# Patient Record
Sex: Female | Born: 1955 | Race: White | Hispanic: No | Marital: Married | Smoking: Never smoker
Health system: Southern US, Community
[De-identification: ages and names within clinical notes are randomized; demographics above are authoritative.]

## PROBLEM LIST (undated history)

## (undated) DIAGNOSIS — G4730 Sleep apnea, unspecified: Secondary | ICD-10-CM

## (undated) DIAGNOSIS — I10 Essential (primary) hypertension: Secondary | ICD-10-CM

## (undated) DIAGNOSIS — J45909 Unspecified asthma, uncomplicated: Secondary | ICD-10-CM

## (undated) DIAGNOSIS — M810 Age-related osteoporosis without current pathological fracture: Secondary | ICD-10-CM

## (undated) DIAGNOSIS — F419 Anxiety disorder, unspecified: Secondary | ICD-10-CM

## (undated) DIAGNOSIS — H269 Unspecified cataract: Secondary | ICD-10-CM

## (undated) DIAGNOSIS — I2510 Atherosclerotic heart disease of native coronary artery without angina pectoris: Secondary | ICD-10-CM

## (undated) DIAGNOSIS — R7303 Prediabetes: Secondary | ICD-10-CM

## (undated) DIAGNOSIS — K5732 Diverticulitis of large intestine without perforation or abscess without bleeding: Secondary | ICD-10-CM

## (undated) DIAGNOSIS — M1990 Unspecified osteoarthritis, unspecified site: Secondary | ICD-10-CM

## (undated) DIAGNOSIS — J189 Pneumonia, unspecified organism: Secondary | ICD-10-CM

## (undated) DIAGNOSIS — E079 Disorder of thyroid, unspecified: Secondary | ICD-10-CM

## (undated) DIAGNOSIS — G8929 Other chronic pain: Secondary | ICD-10-CM

## (undated) DIAGNOSIS — J939 Pneumothorax, unspecified: Secondary | ICD-10-CM

## (undated) DIAGNOSIS — T7840XA Allergy, unspecified, initial encounter: Secondary | ICD-10-CM

## (undated) DIAGNOSIS — F32A Depression, unspecified: Secondary | ICD-10-CM

## (undated) DIAGNOSIS — K219 Gastro-esophageal reflux disease without esophagitis: Secondary | ICD-10-CM

## (undated) DIAGNOSIS — E039 Hypothyroidism, unspecified: Secondary | ICD-10-CM

## (undated) DIAGNOSIS — M4120 Other idiopathic scoliosis, site unspecified: Secondary | ICD-10-CM

## (undated) DIAGNOSIS — E119 Type 2 diabetes mellitus without complications: Secondary | ICD-10-CM

## (undated) DIAGNOSIS — F329 Major depressive disorder, single episode, unspecified: Secondary | ICD-10-CM

## (undated) DIAGNOSIS — Z9289 Personal history of other medical treatment: Secondary | ICD-10-CM

## (undated) DIAGNOSIS — M549 Dorsalgia, unspecified: Secondary | ICD-10-CM

## (undated) HISTORY — DX: Unspecified asthma, uncomplicated: J45.909

## (undated) HISTORY — DX: Disorder of thyroid, unspecified: E07.9

## (undated) HISTORY — DX: Anxiety disorder, unspecified: F41.9

## (undated) HISTORY — DX: Essential (primary) hypertension: I10

## (undated) HISTORY — PX: SPINAL FUSION: SHX223

## (undated) HISTORY — DX: Depression, unspecified: F32.A

## (undated) HISTORY — DX: Unspecified cataract: H26.9

## (undated) HISTORY — DX: Type 2 diabetes mellitus without complications: E11.9

## (undated) HISTORY — DX: Sleep apnea, unspecified: G47.30

## (undated) HISTORY — PX: SMALL INTESTINE SURGERY: SHX150

## (undated) HISTORY — PX: TONSILLECTOMY: SUR1361

## (undated) HISTORY — PX: COLON SURGERY: SHX602

## (undated) HISTORY — DX: Other idiopathic scoliosis, site unspecified: M41.20

## (undated) HISTORY — PX: INCONTINENCE SURGERY: SHX676

## (undated) HISTORY — PX: JOINT REPLACEMENT: SHX530

## (undated) HISTORY — PX: ELBOW SURGERY: SHX618

## (undated) HISTORY — DX: Age-related osteoporosis without current pathological fracture: M81.0

## (undated) HISTORY — DX: Allergy, unspecified, initial encounter: T78.40XA

## (undated) HISTORY — PX: ABDOMINAL HYSTERECTOMY: SHX81

## (undated) HISTORY — PX: TOTAL HIP ARTHROPLASTY: SHX124

## (undated) HISTORY — PX: OTHER SURGICAL HISTORY: SHX169

## (undated) HISTORY — DX: Major depressive disorder, single episode, unspecified: F32.9

## (undated) HISTORY — DX: Diverticulitis of large intestine without perforation or abscess without bleeding: K57.32

## (undated) HISTORY — DX: Gastro-esophageal reflux disease without esophagitis: K21.9

---

## 2013-08-11 LAB — HM DEXA SCAN

## 2014-03-31 DIAGNOSIS — J189 Pneumonia, unspecified organism: Secondary | ICD-10-CM

## 2014-03-31 HISTORY — DX: Pneumonia, unspecified organism: J18.9

## 2015-07-20 LAB — HEPATIC FUNCTION PANEL
ALT: 21 U/L (ref 7–35)
AST: 19 U/L (ref 13–35)
Alkaline Phosphatase: 60 U/L (ref 25–125)
BILIRUBIN, TOTAL: 0.4 mg/dL

## 2015-07-20 LAB — BASIC METABOLIC PANEL
BUN: 18 mg/dL (ref 4–21)
CREATININE: 0.7 mg/dL (ref 0.5–1.1)
Glucose: 106 mg/dL
POTASSIUM: 3.9 mmol/L (ref 3.4–5.3)
SODIUM: 137 mmol/L (ref 137–147)

## 2015-07-20 LAB — CBC AND DIFFERENTIAL
HCT: 40 % (ref 36–46)
Hemoglobin: 13.2 g/dL (ref 12.0–16.0)
Platelets: 184 10*3/uL (ref 150–399)
WBC: 6.3 10*3/mL

## 2015-07-20 LAB — LIPID PANEL
CHOLESTEROL: 208 mg/dL — AB (ref 0–200)
HDL: 79 mg/dL — AB (ref 35–70)
Triglycerides: 86 mg/dL (ref 40–160)

## 2015-07-20 LAB — TSH: TSH: 0.9 u[IU]/mL (ref 0.41–5.90)

## 2015-11-27 ENCOUNTER — Ambulatory Visit (INDEPENDENT_AMBULATORY_CARE_PROVIDER_SITE_OTHER): Payer: BLUE CROSS/BLUE SHIELD | Admitting: Physician Assistant

## 2015-11-27 ENCOUNTER — Telehealth: Payer: Self-pay | Admitting: Physician Assistant

## 2015-11-27 ENCOUNTER — Encounter: Payer: Self-pay | Admitting: Physician Assistant

## 2015-11-27 VITALS — BP 104/63 | HR 94 | Ht <= 58 in | Wt 115.0 lb

## 2015-11-27 DIAGNOSIS — K219 Gastro-esophageal reflux disease without esophagitis: Secondary | ICD-10-CM

## 2015-11-27 DIAGNOSIS — M810 Age-related osteoporosis without current pathological fracture: Secondary | ICD-10-CM

## 2015-11-27 DIAGNOSIS — F3342 Major depressive disorder, recurrent, in full remission: Secondary | ICD-10-CM

## 2015-11-27 DIAGNOSIS — Z23 Encounter for immunization: Secondary | ICD-10-CM | POA: Diagnosis not present

## 2015-11-27 DIAGNOSIS — K2100 Gastro-esophageal reflux disease with esophagitis, without bleeding: Secondary | ICD-10-CM

## 2015-11-27 DIAGNOSIS — Z79899 Other long term (current) drug therapy: Secondary | ICD-10-CM

## 2015-11-27 DIAGNOSIS — M6289 Other specified disorders of muscle: Secondary | ICD-10-CM

## 2015-11-27 DIAGNOSIS — J4520 Mild intermittent asthma, uncomplicated: Secondary | ICD-10-CM

## 2015-11-27 DIAGNOSIS — J45909 Unspecified asthma, uncomplicated: Secondary | ICD-10-CM

## 2015-11-27 DIAGNOSIS — F411 Generalized anxiety disorder: Secondary | ICD-10-CM | POA: Diagnosis not present

## 2015-11-27 DIAGNOSIS — E039 Hypothyroidism, unspecified: Secondary | ICD-10-CM | POA: Insufficient documentation

## 2015-11-27 DIAGNOSIS — L821 Other seborrheic keratosis: Secondary | ICD-10-CM | POA: Insufficient documentation

## 2015-11-27 DIAGNOSIS — G4700 Insomnia, unspecified: Secondary | ICD-10-CM | POA: Insufficient documentation

## 2015-11-27 DIAGNOSIS — G729 Myopathy, unspecified: Secondary | ICD-10-CM

## 2015-11-27 DIAGNOSIS — I2510 Atherosclerotic heart disease of native coronary artery without angina pectoris: Secondary | ICD-10-CM | POA: Insufficient documentation

## 2015-11-27 DIAGNOSIS — M419 Scoliosis, unspecified: Secondary | ICD-10-CM

## 2015-11-27 DIAGNOSIS — H04123 Dry eye syndrome of bilateral lacrimal glands: Secondary | ICD-10-CM | POA: Insufficient documentation

## 2015-11-27 HISTORY — DX: Unspecified asthma, uncomplicated: J45.909

## 2015-11-27 HISTORY — DX: Age-related osteoporosis without current pathological fracture: M81.0

## 2015-11-27 HISTORY — DX: Gastro-esophageal reflux disease without esophagitis: K21.9

## 2015-11-27 NOTE — Patient Instructions (Addendum)
Seborrheic Keratosis Seborrheic keratosis is a common, noncancerous (benign) skin growth. This condition causes waxy, rough, tan, brown, or black spots to appear on the skin. These skin growths can be flat or raised. CAUSES The cause of this condition is not known. RISK FACTORS This condition is more likely to develop in:  People who have a family history of seborrheic keratosis.  People who are 17 or older.  People who are pregnant.  People who have had estrogen replacement therapy. SYMPTOMS This condition often occurs on the face, chest, shoulders, back, or other areas. These growths:  Are usually painless, but may become irritated and itchy.  Can be yellow, brown, black, or other colors.  Are slightly raised or have a flat surface.  Are sometimes rough or wart-like in texture.  Are often waxy on the surface.  Are round or oval-shaped.  Sometimes look like they are "stuck on."  Often occur in groups, but may occur as a single growth. DIAGNOSIS This condition is diagnosed with a medical history and physical exam. A sample of the growth may be tested (skin biopsy). You may need to see a skin specialist (dermatologist). TREATMENT Treatment is not usually needed for this condition, unless the growths are irritated or are often bleeding. You may also choose to have the growths removed if you do not like their appearance. Most commonly, these growths are treated with a procedure in which liquid nitrogen is applied to "freeze" off the growth (cryosurgery). They may also be burned off with electricity or cut off. HOME CARE INSTRUCTIONS  Watch your growth for any changes.  Keep all follow-up visits as told by your health care provider. This is important.  Do not scratch or pick at the growth or growths. This can cause them to become irritated or infected. SEEK MEDICAL CARE IF:  You suddenly have many new growths.  Your growth bleeds, itches, or hurts.  Your growth suddenly  becomes larger or changes color.   This information is not intended to replace advice given to you by your health care provider. Make sure you discuss any questions you have with your health care provider.   Document Released: 04/19/2010 Document Revised: 12/06/2014 Document Reviewed: 08/02/2014 Elsevier Interactive Patient Education 2016 [employer]    Biceps Tendon Disruption (Distal) With Rehab The biceps tendon attaches the biceps muscle to the bones of the elbow and the shoulder. A distal biceps tendon disruption is a tear of this tendon at the end the attached near the elbow. A distal biceps tendon rupture is an uncommon injury. These injuries usually involve a complete tear of the tendon from the bone; however, partial tears are also possible. The bicep muscle works with other muscles to bend the elbow and rotate the palm upward (supinate). A complete biceps rupture will result in approximately a 30% decrease in elbow bending strength and a 40% decrease in one's ability to supinate the wrist. SYMPTOMS   Pain, tenderness, swelling, warmth, or redness at the elbow, usually in the front of the elbow.  Pain that worsens with flexion of the elbow against resistance and when straightening the elbow.  Bulge can be seen and felt in the arm.  Bruising (contusion) in the elbow or forearm after 24 hours.  Limited motion of the elbow.  Weakness with attempted elbow bending (lifting or carrying) or rotation of the wrist (like when using a screwdriver).  A crackling sound (crepitation) when the tendon or elbow is moved or touched. CAUSES  A biceps  tendon rupture occurs when the tendon is subjected to a force that is greater than it can withstand, such as straightening the elbow while the biceps is contracted or direct trauma (rare). RISK INCREASES WITH:   Sports that involve contact, as well as throwing sports, gymnastics, weightlifting, and bodybuilding.  Heavy labor.  Poor strength  and flexibility.  Failure to warm-up properly before activity. PREVENTION  Warm up and stretch appropriately before activity.  Maintain physical fitness:  Strength, flexibility, and endurance.  Cardiovascular fitness  Allow your body to recover between practices and competition.  Learn and use proper technique. PROGNOSIS  Surgery is usually required to fix distal biceps tendon rupture. After surgery, a recovery period of 4 to 8 months can be expected to allow for healing and a return to sports.  RELATED COMPLICATIONS  Weakness of elbow bending and forearm rotation, especially if treated non-surgically.  Prolonged disability.  Re-rupture of the tendon after surgery.  Risks of surgery, including infection, bleeding, injury to nerves, elbow or wrist stiffness or loss of motion, and weakness of elbow bending or wrist rotation. TREATMENT  Treatment initially consists of ice and medication to help reduce pain and inflammation. A sling may also be worn to increase one's comfort. Surgery is required for a full recovery and return to sports. Surgery involves reattaching the tendon to the bone. Weakness can be expected if surgery is not performed; however, this may be acceptable for sedentary individuals. Surgery is usually followed by immobilization and rehabilitation exercises to regain strength and a full range of motion.  MEDICATION  If pain medication is necessary, nonsteroidal anti-inflammatory medications, such as aspirin and ibuprofen, or other minor pain relievers, such as acetaminophen, are often recommended.  Do not take pain medication for 7 days before surgery.  Prescription pain relievers may be given if deemed necessary by your caregiver. Use only as directed and only as much as you need. HEAT AND COLD  Cold treatment (icing) relieves pain and reduces inflammation. Cold treatment should be applied for 10 to 15 minutes every 2 to 3 hours for inflammation and pain and  immediately after any activity that aggravates your symptoms. Use ice packs or an ice massage.  Heat treatment may be used prior to performing the stretching and strengthening activities prescribed by your caregiver, physical therapist, or athletic trainer. Use a heat pack or a warm soak. SEEK MEDICAL CARE IF:   Symptoms get worse or do not improve in 2 weeks despite treatment.  You experience pain, numbness, or coldness in the hand.  Blue, gray, or dark color appears in the fingernails.  Any of the following occur after surgery:  Increased pain, swelling, redness, drainage, or bleeding in the surgical area.  Signs of infection (headache, muscle aches, dizziness, or a general ill feeling with fever).  New, unexplained symptoms develop (drugs used in treatment may produce side effects). EXERCISES RANGE OF MOTION (ROM) AND STRETCHING EXERCISES - Biceps Tendon Disruption (Distal) Once your physician, physical therapist or athletic trainer has permitted you to discontinue using your brace or splint, you may begin to restore your elbow motion by using these exercises. Beginning these exercises before your provider's approval may result in delayed healing. While completing these exercises, remember:   Restoring tissue flexibility helps normal motion to return to the joints. This allows healthier, less painful movement and activity.  An effective stretch should be held for at least 30 seconds.  A stretch should never be painful. You should only feel a gentle  lengthening or release in the stretched tissue. RANGE OF MOTION - Extension  Hold your right / left arm at your side and straighten your elbow as far as you can using your right / left arm muscles.  Straighten the right / left elbow farther by gently pushing down on your forearm until you feel a gentle stretch on the inside of your elbow. Hold this position for __________ seconds.  Slowly return to the starting position. Repeat  __________ times. Complete this exercise __________ times per day.  RANGE OF MOTION - Flexion  Hold your right / left arm at your side and bend your elbow as far as you can using your right / left arm muscles.  Bend the right / left elbow farther by gently pushing up on your forearm until you feel a gentle stretch on the outside of your elbow. Hold this position for __________ seconds.  Slowly return to the starting position. Repeat __________ times. Complete this exercise __________ times per day.  RANGE OF MOTION - Supination, Active   Stand or sit with your elbows at your side. Bend your right / left elbow to 90 degrees.  Turn your palm upward until you feel a gentle stretch on the inside of your forearm.  Hold this position for __________ seconds. Slowly release and return to the starting position. Repeat __________ times. Complete this stretch __________ times per day.  RANGE OF MOTION - Pronation, Active   Stand or sit with your elbows at your side. Bend your right / left elbow to 90 degrees.  Turn your palm downward until you feel a gentle stretch on the top of your forearm.  Hold this position for __________ seconds. Slowly release and return to the starting position. Repeat __________ times. Complete this stretch __________ times per day.  STRENGTHENING EXERCISES - Biceps Tendon Disruption (Distal) Once your physician, physical therapist, or athletic trainer has permitted you to discontinue using your brace or splint, you may begin restoring your arm strength by using these exercises. Beginning these before your provider's approval may result in delayed healing. While completing these exercises, remember:   Muscles can gain both the endurance and the strength needed for everyday activities through controlled exercises.  Complete these exercises as instructed by your physician, physical therapist, or athletic trainer. Progress the resistance and repetitions only as  guided.  You may experience muscle soreness or fatigue, but the pain or discomfort you are trying to eliminate should never worsen during these exercises. If this pain does worsen, stop and make certain you are following the directions exactly. If the pain is still present after adjustments, discontinue the exercise until you can discuss the trouble with your clinician. STRENGTH - Elbow Flexors, Isometric   Stand or sit upright on a firm surface. Place your right / left arm so that your hand is palm-up and at the height of your waist.  Place your opposite hand on top of your forearm. Gently push down as your right / left arm resists. Push as hard as you can with both arms without causing any pain or movement at your right / left elbow. Hold this stationary position for __________ seconds.  Gradually release the tension in both arms. Allow your muscles to relax completely before repeating. Repeat __________ times. Complete this exercise __________ times per day. STRENGTH - Elbow Extensors, Isometric   Stand or sit upright on a firm surface. Place your right / left arm so that your palm faces your abdomen and it  is at the height of your waist.  Place your opposite hand on the underside of your forearm. Gently push up as your right / left arm resists. Push as hard as you can with both arms without causing any pain or movement at your right / left elbow. Hold this stationary position for __________ seconds.  Gradually release the tension in both arms. Allow your muscles to relax completely before repeating. Repeat __________ times. Complete this exercise __________ times per day. STRENGTH - Elbow Flexors, Supinated  With good posture, stand or sit on a firm chair without armrests. Allow your right / left arm to rest at your side with your palm facing forward.  Holding a __________ weight or gripping a rubber exercise band/tubing, bring your right / left hand toward your shoulder.  Allow your  muscles to control the resistance as your hand returns to your side. Repeat __________ times. Complete this exercise __________ times per day.  STRENGTH - Elbow Flexors, Neutral  With good posture, stand or sit on a firm chair without armrests. Allow your right / left arm to rest at your side with your thumb facing forward.  Holding a __________ weight or gripping a rubber exercise band/tubing, bring your right / left hand toward your shoulder.  Allow your muscles to control the resistance as your hand returns to your side. Repeat __________ times. Complete this exercise __________ times per day.  STRENGTH - Elbow Extensors  Lie on your back. Extend your right / left elbow into the air, pointing it toward the ceiling. Brace your arm with your opposite hand.*  Holding a __________ weight in your hand, slowly straighten your right / left elbow.  Allow your muscles to control the weight as your hand returns to its starting position. Repeat __________ times. Complete this exercise __________ times per day. *You may also stand with your elbow overhead and pointed toward the ceiling and supported by your opposite hand. STRENGTH - Elbow Extensors, Dynamic  With good posture, stand or sit on a firm chair without armrests. Keeping your upper arms at your side, bring both hands up to your right / left shoulder while gripping a rubber exercise band/tubing. Your right / left hand should be just below the other hand.  Straighten your right / left elbow. Hold for __________ seconds.  Allow your muscles to control the rubber exercise band/tubing as your hand returns to your shoulder. Repeat __________ times. Complete this exercise __________ times per day. STRENGTH - Forearm Supinators   Sit with your right / left forearm supported on a table, keeping your elbow below shoulder height. Rest your hand over the edge, palm down.  Gently grip a hammer or a soup ladle.  Without moving your elbow, slowly  turn your palm and hand upward to a "thumbs-up" position.  Hold this position for __________ seconds. Slowly return to the starting position. Repeat __________ times. Complete this exercise __________ times per day.  STRENGTH - Forearm Pronators   Sit with your right / left forearm supported on a table, keeping your elbow below shoulder height. Rest your hand over the edge, palm up.  Gently grip a hammer or a soup ladle.  Without moving your elbow, slowly turn your palm and hand upward to a "thumbs-up" position.  Hold this position for __________ seconds. Slowly return to the starting position. Repeat __________ times. Complete this exercise __________ times per day.    This information is not intended to replace advice given to you by your  health care provider. Make sure you discuss any questions you have with your health care provider.   Document Released: 03/17/2005 Document Revised: 04/07/2014 Document Reviewed: 06/29/2008 Elsevier Interactive Patient Education [employer].

## 2015-11-27 NOTE — Telephone Encounter (Signed)
Records release form has been sent. Waiting for records to come in.

## 2015-11-27 NOTE — Progress Notes (Signed)
Subjective:    Patient ID: Leslie Roberson, female    DOB: [birth year], 60 y.o.   MRN: [id number]  HPI Patient is a 60 year old female who presents to the clinic to establish care. She just moved down here from [place].  .. Active Ambulatory Problems    Diagnosis Date Noted  . GERD (gastroesophageal reflux disease) 11/27/2015  . Osteoporosis 11/27/2015  . GAD (generalized anxiety disorder) 11/27/2015  . MDD (major depressive disorder), recurrent, in full remission ([place]) 11/27/2015  . Chronic dryness of both eyes 11/27/2015  . Hypothyroidism 11/27/2015  . Asthma, chronic 11/27/2015  . Insomnia 11/27/2015  . Seborrheic keratoses 11/27/2015   Resolved Ambulatory Problems    Diagnosis Date Noted  . No Resolved Ambulatory Problems   Past Medical History:  Diagnosis Date  . Depression   . Thyroid disease    .Marland Kitchen Family History  Problem Relation Age of Onset  . Cancer Mother     lung  . Depression Mother   . Heart attack Father   . Hyperlipidemia Father   . Hypertension Father   . Diabetes Maternal Aunt   . Hyperlipidemia Paternal Aunt   . COPD Paternal 42    .Marland Kitchen Social History   Social History  . Marital status: Married    Spouse name: N/A  . Number of children: N/A  . Years of education: N/A   Occupational History  . Not on file.   Social History Main Topics  . Smoking status: Never Smoker  . Smokeless tobacco: Never Used  . Alcohol use Yes  . Drug use: No  . Sexual activity: Yes   Other Topics Concern  . Not on file   Social History Narrative  . No narrative on file   PatientHas a few concerns today.  She does need her priorly a injection for osteoporosis. She is behind on this dose.  She also has some gross all over her body that she wants me to look at. They do not hurt and they're not bleeding. They're slightly dark in color.  She also has some right arm pain for the last 8 months. X-ray was done at previous PCP and normal. Some days  pain is 2 out of 10 and others it is 8 out of 10. Seems to be worse with lifting. Seems to ache when touched right over the middle section of right arm. She denies any known trauma. They have been moving over the past 8 months from [place]. She has been doing a lot of lifting.   Review of Systems  All other systems reviewed and are negative.      Objective:   Physical Exam  Constitutional: She is oriented to person, place, and time. She appears well-developed and well-nourished.  HENT:  Head: Normocephalic and atraumatic.  Cardiovascular: Normal rate, regular rhythm and normal heart sounds.   Pulmonary/Chest: Effort normal and breath sounds normal.  Musculoskeletal:  Right shoulder is higher than left shoulder due to major scoliosis curvature to the right.  Tightness of over right upper back.   Tenderness to palpation over bicep tendon. No tenderness over distal or proximal insertion. No bruising or swelling. NROM. Normal strength 5/5.   Neurological: She is alert and oriented to person, place, and time.  Skin:  Multiple hyperpigmented rasied wart like lesions over hands, back, upper leg.   Psychiatric: She has a normal mood and affect. Her behavior is normal.          Assessment & Plan:  Osteoporosis-  bmp ordered. prolia to be ordered. Pt is due.   Right arm pain- unclear etiology. Seems like bicep tendon could be inflamed. Exercises given. Xray normal per patient at previous PCP. Encouraged ice, NSAID. Will refer to sports med to consider MRI and/or other treatment.   Scoliosis/muscle tightness- will refer to chiropractor and consider regular massages Massage [facility]. Soma as needed. Tylenol #3 as needed.   Depression/insomnia- controlled. Will refill as needed.   Hypothyroidism- will refill as needed.  GERD- on dexilant. Discussed getting an endoscopy. Will consider in the future.   Seborrheic keratosis-reassured patient this is a benign condition. Gave handout.  Discussed removal with cryotherapy if bothersome.   Waiting for records and labs from [place].   Flu shot given today without complication.

## 2015-11-27 NOTE — Telephone Encounter (Signed)
[facility], spoke with [place]. Unable to complete prior authorization for continuation of Prolia therapy until records arrive. Must have Pt's "T score" and the date it was obtained.

## 2015-11-27 NOTE — Telephone Encounter (Signed)
Ok will you let patient know.she signed release maybe someone could call and make sure [place] PCP is sending records.

## 2015-11-28 ENCOUNTER — Other Ambulatory Visit: Payer: Self-pay | Admitting: Physician Assistant

## 2015-11-28 ENCOUNTER — Encounter: Payer: Self-pay | Admitting: Physician Assistant

## 2015-11-28 DIAGNOSIS — K2100 Gastro-esophageal reflux disease with esophagitis, without bleeding: Secondary | ICD-10-CM

## 2015-11-28 DIAGNOSIS — Z1231 Encounter for screening mammogram for malignant neoplasm of breast: Secondary | ICD-10-CM

## 2015-11-28 LAB — BASIC METABOLIC PANEL
BUN: 15 mg/dL (ref 7–25)
CALCIUM: 9.2 mg/dL (ref 8.6–10.4)
CHLORIDE: 103 mmol/L (ref 98–110)
CO2: 31 mmol/L (ref 20–31)
CREATININE: 0.95 mg/dL (ref 0.50–0.99)
GLUCOSE: 74 mg/dL (ref 65–99)
Potassium: 4.3 mmol/L (ref 3.5–5.3)
SODIUM: 141 mmol/L (ref 135–146)

## 2015-11-29 ENCOUNTER — Other Ambulatory Visit: Payer: Self-pay | Admitting: *Deleted

## 2015-11-29 DIAGNOSIS — M9902 Segmental and somatic dysfunction of thoracic region: Secondary | ICD-10-CM | POA: Diagnosis not present

## 2015-11-29 DIAGNOSIS — M791 Myalgia: Secondary | ICD-10-CM | POA: Diagnosis not present

## 2015-11-29 DIAGNOSIS — M9901 Segmental and somatic dysfunction of cervical region: Secondary | ICD-10-CM | POA: Diagnosis not present

## 2015-11-29 DIAGNOSIS — G44209 Tension-type headache, unspecified, not intractable: Secondary | ICD-10-CM | POA: Diagnosis not present

## 2015-12-03 ENCOUNTER — Other Ambulatory Visit: Payer: Self-pay | Admitting: Physician Assistant

## 2015-12-05 DIAGNOSIS — M9902 Segmental and somatic dysfunction of thoracic region: Secondary | ICD-10-CM | POA: Diagnosis not present

## 2015-12-05 DIAGNOSIS — M9901 Segmental and somatic dysfunction of cervical region: Secondary | ICD-10-CM | POA: Diagnosis not present

## 2015-12-05 DIAGNOSIS — M791 Myalgia: Secondary | ICD-10-CM | POA: Diagnosis not present

## 2015-12-05 DIAGNOSIS — G44209 Tension-type headache, unspecified, not intractable: Secondary | ICD-10-CM | POA: Diagnosis not present

## 2015-12-06 ENCOUNTER — Telehealth: Payer: Self-pay | Admitting: Physician Assistant

## 2015-12-06 NOTE — Telephone Encounter (Signed)
Ms. Leslie Roberson stopped by saying that she has been waiting on a prescription for Clonazepam to be sent to [facility]. She says she has been waiting for it all week.

## 2015-12-07 ENCOUNTER — Other Ambulatory Visit: Payer: Self-pay | Admitting: Physician Assistant

## 2015-12-07 NOTE — Telephone Encounter (Signed)
In system it was sent on 9/6- can we call pharmacy to see if patient has picked up? Or do we need to refax.

## 2015-12-07 NOTE — Progress Notes (Signed)
Sent to [facility]ber to call patient and see whats going on. klonapin was sent on 12/05/15.

## 2015-12-07 NOTE — Telephone Encounter (Signed)
Faxed to pharmacy again this morning.  Pt notified.

## 2015-12-12 DIAGNOSIS — M791 Myalgia: Secondary | ICD-10-CM | POA: Diagnosis not present

## 2015-12-12 DIAGNOSIS — M9902 Segmental and somatic dysfunction of thoracic region: Secondary | ICD-10-CM | POA: Diagnosis not present

## 2015-12-12 DIAGNOSIS — G44209 Tension-type headache, unspecified, not intractable: Secondary | ICD-10-CM | POA: Diagnosis not present

## 2015-12-12 DIAGNOSIS — M9901 Segmental and somatic dysfunction of cervical region: Secondary | ICD-10-CM | POA: Diagnosis not present

## 2015-12-13 DIAGNOSIS — K219 Gastro-esophageal reflux disease without esophagitis: Secondary | ICD-10-CM | POA: Diagnosis not present

## 2015-12-13 DIAGNOSIS — Z86010 Personal history of colonic polyps: Secondary | ICD-10-CM | POA: Diagnosis not present

## 2015-12-20 DIAGNOSIS — K219 Gastro-esophageal reflux disease without esophagitis: Secondary | ICD-10-CM | POA: Diagnosis not present

## 2015-12-20 DIAGNOSIS — Z86010 Personal history of colonic polyps: Secondary | ICD-10-CM | POA: Diagnosis not present

## 2015-12-20 LAB — HM COLONOSCOPY

## 2015-12-21 ENCOUNTER — Encounter: Payer: Self-pay | Admitting: Physician Assistant

## 2015-12-21 DIAGNOSIS — K5732 Diverticulitis of large intestine without perforation or abscess without bleeding: Secondary | ICD-10-CM | POA: Insufficient documentation

## 2015-12-21 DIAGNOSIS — M9901 Segmental and somatic dysfunction of cervical region: Secondary | ICD-10-CM | POA: Diagnosis not present

## 2015-12-21 DIAGNOSIS — G44209 Tension-type headache, unspecified, not intractable: Secondary | ICD-10-CM | POA: Diagnosis not present

## 2015-12-21 DIAGNOSIS — M9902 Segmental and somatic dysfunction of thoracic region: Secondary | ICD-10-CM | POA: Diagnosis not present

## 2015-12-21 DIAGNOSIS — M791 Myalgia: Secondary | ICD-10-CM | POA: Diagnosis not present

## 2015-12-21 HISTORY — DX: Diverticulitis of large intestine without perforation or abscess without bleeding: K57.32

## 2015-12-25 ENCOUNTER — Encounter: Payer: Self-pay | Admitting: Physician Assistant

## 2015-12-25 ENCOUNTER — Ambulatory Visit (INDEPENDENT_AMBULATORY_CARE_PROVIDER_SITE_OTHER): Payer: BLUE CROSS/BLUE SHIELD | Admitting: Family Medicine

## 2015-12-25 ENCOUNTER — Ambulatory Visit (INDEPENDENT_AMBULATORY_CARE_PROVIDER_SITE_OTHER): Payer: BLUE CROSS/BLUE SHIELD | Admitting: Physician Assistant

## 2015-12-25 VITALS — BP 129/70 | HR 112 | Ht <= 58 in | Wt 116.0 lb

## 2015-12-25 VITALS — BP 129/70 | HR 112 | Wt 116.0 lb

## 2015-12-25 DIAGNOSIS — M810 Age-related osteoporosis without current pathological fracture: Secondary | ICD-10-CM | POA: Diagnosis not present

## 2015-12-25 DIAGNOSIS — M7521 Bicipital tendinitis, right shoulder: Secondary | ICD-10-CM | POA: Diagnosis not present

## 2015-12-25 DIAGNOSIS — M7070 Other bursitis of hip, unspecified hip: Secondary | ICD-10-CM | POA: Insufficient documentation

## 2015-12-25 DIAGNOSIS — Z1159 Encounter for screening for other viral diseases: Secondary | ICD-10-CM

## 2015-12-25 DIAGNOSIS — Z13220 Encounter for screening for lipoid disorders: Secondary | ICD-10-CM

## 2015-12-25 DIAGNOSIS — M7071 Other bursitis of hip, right hip: Secondary | ICD-10-CM | POA: Diagnosis not present

## 2015-12-25 DIAGNOSIS — IMO0001 Reserved for inherently not codable concepts without codable children: Secondary | ICD-10-CM

## 2015-12-25 DIAGNOSIS — M791 Myalgia: Secondary | ICD-10-CM | POA: Diagnosis not present

## 2015-12-25 DIAGNOSIS — M609 Myositis, unspecified: Secondary | ICD-10-CM

## 2015-12-25 MED ORDER — DICLOFENAC SODIUM 1 % TD GEL: 4.0000 g | Freq: Four times a day (QID) | TRANSDERMAL | 2 refills | Status: DC

## 2015-12-25 MED ORDER — HYDROCORTISONE 2.5 % EX OINT: TOPICAL_OINTMENT | CUTANEOUS | 1 refills | Status: DC

## 2015-12-25 NOTE — Patient Instructions (Signed)
Thank you for coming in today. Attend PT.  Return before the wedding in about 4 weeks.    Trochanteric Bursitis You have hip pain due to trochanteric bursitis. Bursitis means that the sack near the outside of the hip is filled with fluid and inflamed. This sack is made up of protective soft tissue. The pain from trochanteric bursitis can be severe and keep you from sleep. It can radiate to the buttocks or down the outside of the thigh to the knee. The pain is almost always worse when rising from the seated or lying position and with walking. Pain can improve after you take a few steps. It happens more often in people with hip joint and lumbar spine problems, such as arthritis or previous surgery. Very rarely the trochanteric bursa can become infected, and antibiotics and/or surgery may be needed. Treatment often includes an injection of local anesthetic mixed with cortisone medicine. This medicine is injected into the area where it is most tender over the hip. Repeat injections may be necessary if the response to treatment is slow. You can apply ice packs over the tender area for 30 minutes every 2 hours for the next few days. Anti-inflammatory and/or narcotic pain medicine may also be helpful. Limit your activity for the next few days if the pain continues. See your caregiver in 5-10 days if you are not greatly improved.  SEEK IMMEDIATE MEDICAL CARE IF:  You develop severe pain, fever, or increased redness.  You have pain that radiates below the knee. EXERCISES STRETCHING EXERCISES - Trochanteric Bursitis  These exercises may help you when beginning to rehabilitate your injury. Your symptoms may resolve with or without further involvement from your physician, physical therapist, or athletic trainer. While completing these exercises, remember:   Restoring tissue flexibility helps normal motion to return to the joints. This allows healthier, less painful movement and activity.  An effective stretch  should be held for at least 30 seconds.  A stretch should never be painful. You should only feel a gentle lengthening or release in the stretched tissue. STRETCH - Iliotibial Band  On the floor or bed, lie on your side so your injured leg is on top. Bend your knee and grab your ankle.  Slowly bring your knee back so that your thigh is in line with your trunk. Keep your heel at your buttocks and gently arch your back so your head, shoulders and hips line up.  Slowly lower your leg so that your knee approaches the floor/bed until you feel a gentle stretch on the outside of your thigh. If you do not feel a stretch and your knee will not fall farther, place the heel of your opposite foot on top of your knee and pull your thigh down farther.  Hold this stretch for __________ seconds.  Repeat __________ times. Complete this exercise __________ times per day. STRETCH - Hamstrings, Supine   Lie on your back. Loop a belt or towel over the ball of your foot as shown.  Straighten your knee and slowly pull on the belt to raise your injured leg. Do not allow the knee to bend. Keep your opposite leg flat on the floor.  Raise the leg until you feel a gentle stretch behind your knee or thigh. Hold this position for __________ seconds.  Repeat __________ times. Complete this stretch __________ times per day. STRETCH - Quadriceps, Prone   Lie on your stomach on a firm surface, such as a bed or padded floor.  [employer]  your knee and grasp your ankle. If you are unable to reach your ankle or pant leg, use a belt around your foot to lengthen your reach.  Gently pull your heel toward your buttocks. Your knee should not slide out to the side. You should feel a stretch in the front of your thigh and/or knee.  Hold this position for __________ seconds.  Repeat __________ times. Complete this stretch __________ times per day. STRETCHING - Hip Flexors, Lunge Half kneel with your knee on the floor and your  opposite knee bent and directly over your ankle.  Keep good posture with your head over your shoulders. Tighten your buttocks to point your tailbone downward; this will prevent your back from arching too much.  You should feel a gentle stretch in the front of your thigh and/or hip. If you do not feel any resistance, slightly slide your opposite foot forward and then slowly lunge forward so your knee once again lines up over your ankle. Be sure your tailbone remains pointed downward.  Hold this stretch for __________ seconds.  Repeat __________ times. Complete this stretch __________ times per day. STRETCH - Adductors, Lunge  While standing, spread your legs.  Lean away from your injured leg by bending your opposite knee. You may rest your hands on your thigh for balance.  You should feel a stretch in your inner thigh. Hold for __________ seconds.  Repeat __________ times. Complete this exercise __________ times per day.   This information is not intended to replace advice given to you by your health care provider. Make sure you discuss any questions you have with your health care provider.   Document Released: 04/24/2004 Document Revised: 08/01/2014 Document Reviewed: 06/29/2008 Elsevier Interactive Patient Education [employer].

## 2015-12-25 NOTE — Progress Notes (Signed)
   Subjective:    I'm seeing this patient as a consultation for:  Iran Planas, PA-C   CC: Right arm pain and right hip pain  HPI: Patient has a history of severe scoliosis managed with immobilization in her adolescence.  She notes today however right lateral hip pain. This is been ongoing intermittently for months to years. She notes right lateral hip pain worse with walking and standing and laying on her right side. Rest does tend to help when she does not have pressure on the right hip. She has not had a chance to try much exercises activities are or treatment yet for this problem. She takes medications listed below which helps some.  Additionally patient notes right arm pain. She has pain in the right proximal biceps worse with biceps flexion when the arm is extended. Specifically she notes when she pulls the covers across her body at night that tends to worsen her pain. This is been ongoing for a few months without injury. She denies significant radiating pain weakness or numbness.  Past medical history, Surgical history, Family history not pertinant except as noted below, Social history, Allergies, and medications have been entered into the medical record, reviewed, and no changes needed.   Review of Systems: No headache, visual changes, nausea, vomiting, diarrhea, constipation, dizziness, abdominal pain, skin rash, fevers, chills, night sweats, weight loss, swollen lymph nodes, body aches, joint swelling, muscle aches, chest pain, shortness of breath, mood changes, visual or auditory hallucinations.   Objective:    Vitals:   12/25/15 1423  BP: 129/70  Pulse: (!) 112   General: Well Developed, well nourished, and in no acute distress.  Neuro/Psych: Alert and oriented x3, extra-ocular muscles intact, able to move all 4 extremities, sensation grossly intact. Skin: Warm and dry, no rashes noted.  Respiratory: Not using accessory muscles, speaking in full sentences, trachea midline.    Cardiovascular: Pulses palpable, no extremity edema. Abdomen: Does not appear distended. MSK: Right shoulder: Normal-appearing nontender. Normal shoulder motion. Negative impingement testing. Negative Yergason's and speeds test. Pain is reproducible with arm extension and resisted elbow flexion.  Back: Obvious scoliosis present. Left pelvis elevated L spine is nontender.  Right hip: Tender palpation greater trochanter. Normal hip motion. Hip abduction strength is weak.  Leg length: Right leg is 1 cm longer than left  No results found for this or any previous visit (from the past 24 hour(s)). No results found.  Impression and Recommendations:    Assessment and Plan: 60 y.o. female with  Right lateral hip pain is greater trochanteric bursitis. Plan for physical therapy. Recheck in a few weeks. Plan for injection at that time if not better.  Right arm pain: This is likely biceps tendinitis versus pectoralis insertional tendinitis. Plan for physical therapy if not better diagnostic ultrasound with possible injection..   Discussed warning signs or symptoms. Please see discharge instructions. Patient expresses understanding.

## 2015-12-25 NOTE — Patient Instructions (Signed)
Biceps Tendon Tendinitis (Proximal) and Tenosynovitis With Rehab Tendonitis and tenosynovitis involve inflammation of the tendon and the tendon lining (sheath). The proximal biceps tendon is vulnerable to tendonitis and tenosynovitis, which causes pain and discomfort in the front of the shoulder and upper arm. The tendon lining secretes a fluid that helps lubricate the tendon, allowing for proper function without pain. When the tendon and its lining become inflamed, the tendon can no longer glide smoothly, causing pain. The proximal biceps tendon connects the biceps muscle to two bones of the shoulder. It is important for proper function of the elbow and turning the palm upward (supination) using the wrist. Proximal biceps tendon tendinitis may include a grade 1 or 2 strain of the tendon. Grade 1 strains involve a slight pull of the tendon without signs of tearing and no observed tendon lengthening. There is also no loss of strength. Grade 2 strains involve small tears in the tendon fibers. The tendon or muscle is stretched and strength is usually decreased.  SYMPTOMS   Pain, tenderness, swelling, warmth, or redness over the front of the shoulder.  Pain that gets worse with shoulder and elbow use, especially against resistance.  Limited motion of the shoulder or elbow.  Crackling sound (crepitation) when the tendon or shoulder is moved or touched. CAUSES  The symptoms of biceps tendonitis are due to inflammation of the tendon. Inflammation may be caused by:  Strain from sudden increase in amount or intensity of activity.  Direct blow or injury to the elbow (uncommon).  Overuse or repetitive elbow bending or wrist rotation, particularly when turning the palm up, or with elbow hyperextension. RISK INCREASES WITH:  Sports that involve contact or overhead arm activity (throwing sports, gymnastics, weightlifting, bodybuilding, rock climbing).  Heavy labor.  Poor strength and  flexibility.  Failure to warm up properly before activity. PREVENTION  Warm up and stretch properly before activity.  Allow time for recovery between activities.  Maintain physical fitness:  Strength, flexibility, and endurance.  Cardiovascular fitness.  Learn and use proper exercise technique. PROGNOSIS  With proper treatment, proximal biceps tendon tendonitis and tenosynovitis is usually curable within 6 weeks. Healing is usually quicker if the cause was a direct blow, not overuse.  RELATED COMPLICATIONS   Longer healing time if not properly treated or if not given enough time to heal.  Chronically inflamed tendon that causes persistent pain with activity, that may progress to constant pain and potentially rupture of the tendon.  Recurring symptoms, especially if activity is resumed too soon or with overuse, a direct blow, or use of poor exercise technique. TREATMENT Treatment first involves ice and medicine, to reduce pain and inflammation. It is helpful to modify activities that cause pain, to reduce the chances of causing the condition to get worse. Strengthening and stretching exercises should be performed to promote proper use of the muscles of the shoulder. These exercises may be performed at home or with a therapist. Other treatments may be given such as ultrasound or heat therapy. A corticosteroid injection may be recommended to help reduce inflammation of the tendon lining. Surgery is usually not necessary. Sometimes, if symptoms last for greater than 6 months, surgery will be advised to detach the tendon and re-insert it into the arm bone. Surgery to correct other shoulder problems that may be contributing to tendinitis may be advised before surgery for the tendinitis itself.  MEDICATION  If pain medicine is needed, nonsteroidal anti-inflammatory medicines (aspirin and ibuprofen), or other minor pain relievers (  acetaminophen), are often advised.  Do not take pain medicine  for 7 days before surgery.  Prescription pain relievers may be given if your caregiver thinks they are needed. Use only as directed and only as much as you need.  Corticosteroid injections may be given. These injections should only be used on the most severe cases, as one can only receive a limited number of them. HEAT AND COLD   Cold treatment (icing) should be applied for 10 to 15 minutes every 2 to 3 hours for inflammation and pain, and immediately after activity that aggravates your symptoms. Use ice packs or an ice massage.  Heat treatment may be used before performing stretching and strengthening activities prescribed by your caregiver, physical therapist, or athletic trainer. Use a heat pack or a warm water soak. SEEK MEDICAL CARE IF:   Symptoms get worse or do not improve in 2 weeks, despite treatment.  New, unexplained symptoms develop. (Drugs used in treatment may produce side effects.) EXERCISES RANGE OF MOTION (ROM) AND EXERCISES - Biceps Tendon (Proximal) and Tenosynovitis These exercises may help you when beginning to rehabilitate your injury. Your symptoms may go away with or without further involvement from your physician, physical therapist, or athletic trainer. While completing these exercises, remember:   Restoring tissue flexibility helps normal motion to return to the joints. This allows healthier, less painful movement and activity.  An effective stretch should be held for at least 30 seconds.  A stretch should never be painful. You should only feel a gentle lengthening or release in the stretched tissue. STRETCH - Flexion, Standing  Stand with good posture. With an underhand grip on your right / left hand and an overhand grip on the opposite hand, grasp a broomstick or cane so that your hands are a little more than shoulder width apart.  Keeping your right / left elbow straight and shoulder muscles relaxed, push the stick with your opposite hand to raise your right  / left arm in front of your body and then overhead. Raise your arm until you feel a stretch in your right / left shoulder, but before you have increased shoulder pain.  Try to avoid shrugging your right / left shoulder as your arm rises, by keeping your shoulder blade tucked down and toward your mid-back spine. Hold for __________ seconds.  Slowly return to the starting position. Repeat __________ times. Complete this exercise __________ times per day. STRETCH - Abduction, Supine  Lie on your back. With an underhand grip on your right / left hand and an overhand grip on the opposite hand, grasp a broomstick or cane so that your hands are a little more than shoulder width apart.  Keeping your right / left elbow straight and shoulder muscles relaxed, push the stick with your opposite hand to raise your right / left arm out to the side of your body and then overhead. Raise your arm until you feel a stretch in your right / left shoulder, but before you have increased shoulder pain.  Try to avoid shrugging your right / left shoulder as your arm rises, by keeping your shoulder blade tucked down and toward your mid-back spine. Hold for __________ seconds.  Slowly return to the starting position. Repeat __________ times. Complete this exercise __________ times per day. ROM - Flexion, Active-Assisted  Lie on your back. You may bend your knees for comfort.  Grasp a broomstick or cane so your hands are about shoulder width apart. Your right / left hand should   grip the end of the stick so that your hand is positioned "thumbs-up," as if you were about to shake hands.  Using your healthy arm to lead, raise your right / left arm overhead until you feel a gentle stretch in your shoulder. Hold for __________ seconds.  Use the stick to assist in returning your right / left arm to its starting position. Repeat __________ times. Complete this exercise __________ times per day.  STRETCH - Flexion, Standing    Stand facing a wall. Walk your right / left fingers up the wall until you feel a moderate stretch in your shoulder. As your hand gets higher, you may need to step closer to the wall or use a door frame to walk through.  Try to avoid shrugging your right / left shoulder as your arm rises, by keeping your shoulder blade tucked down and toward your mid-back spine.  Hold for __________ seconds. Use your other hand, if needed, to ease out of the stretch and return to the starting position. Repeat __________ times. Complete this exercise __________ times per day.  ROM - Internal Rotation   Using underhand grips, grasp a stick behind your back with both hands.  While standing upright with good posture, slide the stick up your back until you feel a mild stretch in the front of your shoulder.  Hold for __________ seconds. Slowly return to your starting position. Repeat __________ times. Complete this exercise __________ times per day.  STRETCH - Internal Rotation  Place your right / left hand behind your back, palm-up.  Throw a towel or belt over your opposite shoulder. Grasp the towel with your right / left hand.  While keeping an upright posture, gently pull up on the towel until you feel a stretch in the front of your right / left shoulder.  Avoid shrugging your right / left shoulder as your arm rises, by keeping your shoulder blade tucked down and toward your mid-back spine.  Hold for __________ seconds. Release the stretch by lowering your opposite hand. Repeat __________ times. Complete this exercise __________ times per day. STRENGTHENING EXERCISES - Biceps Tendon Tendinitis (Proximal) and Tenosynovitis These exercises may help you regain your strength after your physician has discontinued your restraint in a cast or brace. They may resolve your symptoms with or without further involvement from your physician, physical therapist or athletic trainer. While completing these exercises,  remember:   Muscles can gain both the endurance and the strength needed for everyday activities through controlled exercises.  Complete these exercises as instructed by your physician, physical therapist or athletic trainer. Increase the resistance and repetitions only as guided.  You may experience muscle soreness or fatigue, but the pain or discomfort you are trying to eliminate should never worsen during these exercises. If this pain does get worse, stop and make sure you are following the directions exactly. If the pain is still present after adjustments, discontinue the exercise until you can discuss the trouble with your caregiver. STRENGTH - Elbow Flexors, Isometric  Stand or sit upright on a firm surface. Place your right / left arm so that your hand is palm-up and at the height of your waist.  Place your opposite hand on top of your forearm. Gently push down as your right / left arm resists. Push as hard as you can with both arms, without causing any pain or movement at your right / left elbow. Hold this stationary position for __________ seconds.  Gradually release the tension in both   arms. Allow your muscles to relax completely before repeating. Repeat __________ times. Complete this exercise __________ times per day. STRENGTH - Shoulder Flexion, Isometric  With good posture and facing a wall, stand or sit about 4-6 inches away.  Keeping your right / left elbow straight, gently press the top of your fist into the wall. Increase the pressure gradually until you are pressing as hard as you can, without shrugging your shoulder or increasing any shoulder discomfort.  Hold for __________ seconds.  Release the tension slowly. Relax your shoulder muscles completely before you start the next repetition. Repeat __________ times. Complete this exercise __________ times per day.  STRENGTH - Elbow Flexors, Supinated  With good posture, stand or sit on a firm chair without armrests. Allow  your right / left arm to rest at your side with your palm facing forward.  Holding a __________ weight, or gripping a rubber exercise band or tubing,  bring your hand toward your shoulder.  Allow your muscles to control the resistance as your hand returns to your side. Repeat __________ times. Complete this exercise __________ times per day.  STRENGTH - Shoulder Flexion  Stand or sit with good posture. Grasp a __________ weight, or an exercise band or tubing, so that your hand is "thumbs-up," like when you shake hands.  Slowly lift your right / left arm as far as you can, without increasing any shoulder pain. At first, many people can only raise their hand to shoulder height.  Avoid shrugging your right / left shoulder as your arm rises, by keeping your shoulder blade tucked down and toward your mid-back spine.  Hold for __________ seconds. Control the descent of your hand as you slowly return to your starting position. Repeat __________ times. Complete this exercise __________ times per day.   This information is not intended to replace advice given to you by your health care provider. Make sure you discuss any questions you have with your health care provider.   Document Released: 03/17/2005 Document Revised: 04/07/2014 Document Reviewed: 06/29/2008 Else[employer] Interactive Patient Education 2016 [employer]   

## 2015-12-25 NOTE — Progress Notes (Addendum)
Subjective:     Patient ID: Leslie Roberson, female   DOB: [birth year], 60 y.o.   MRN: [id number]  HPI Patient is a 60 y.o. caucasian female presenting today with numerous health complaints. Firstly, the patient notes that she has chronic back pain secondary to severe scoliosis. She has had previous surgery but notes that she still has some pain. The patient states that she takes Tylenol 2 tablets once daily and has a prescription for naproxen which she seldomly uses. Additionally, the patient states that she is having some right hip pain and believes she has bursitis. She also is having some right knee pain that is worse with activity. The patient denies any injury, decreased ROM, warmth, numbness, or radiation. She notes that it is interfering with her ability to perform her daily activities. She states that she has had a left hip replacement with some symptom relief. The patient states that she is having some right muscle pain in her upper arm and has had previous imaging with no abnormal results. The patient is currently going to chiropractic with symptom relief for only that one day. Lastly, she states that she recently used aerosol sunscreen around her mouth and has since broken out. The patient denies fever, shortness of breath, palpitation, numbness, tingling, or chest pain.  Review of Systems  Constitutional: Negative for activity change, appetite change, chills, diaphoresis, fatigue, fever and unexpected weight change.  HENT: Negative.   Eyes: Negative.   Respiratory: Positive for chest tightness. Negative for cough, shortness of breath and wheezing.   Cardiovascular: Negative for chest pain, palpitations and leg swelling.  Gastrointestinal: Negative.   Endocrine: Negative.   Genitourinary: Negative.   Musculoskeletal: Positive for arthralgias, back pain, gait problem and myalgias. Negative for joint swelling, neck pain and neck stiffness.  Skin: Negative.   Neurological: Positive for  weakness. Negative for dizziness, syncope, light-headedness, numbness and headaches.  Hematological: Negative.   Psychiatric/Behavioral: Negative.        Objective:   Physical Exam  Constitutional: She is oriented to person, place, and time. She appears well-developed and well-nourished. No distress.  HENT:  Head: Normocephalic and atraumatic.  Right Ear: External ear normal.  Left Ear: External ear normal.  Nose: Nose normal.  Mouth/Throat: Oropharynx is clear and moist.  Eyes: Conjunctivae and EOM are normal. Pupils are equal, round, and reactive to light. Right eye exhibits no discharge. Left eye exhibits no discharge. No scleral icterus.  Neck: Normal range of motion. Neck supple.  Cardiovascular: Normal rate, regular rhythm, normal heart sounds and intact distal pulses.  Exam reveals no gallop and no friction rub.   No murmur heard. Pulmonary/Chest: Effort normal and breath sounds normal. No respiratory distress. She has no wheezes. She has no rales. She exhibits no tenderness.  Musculoskeletal: She exhibits tenderness. She exhibits no edema or deformity.  Patient with right knee tenderness to palpation, full ROM of lower extremity bilaterally. Muscle strength is a +2 on right side and +5 on the left lower extremities. Negative straight leg raise.  Neurological: She is alert and oriented to person, place, and time. No cranial nerve deficit. Coordination normal.  Skin: Skin is warm and dry. No rash noted. She is not diaphoretic. No erythema. No pallor.  Psychiatric: She has a normal mood and affect. Her behavior is normal. Judgment and thought content normal.      Assessment:     Diagnoses and all orders for this visit:  Myalgia and myositis -  CK -     Ferritin -     VITAMIN D 25 Hydroxy (Vit-D Deficiency, Fractures) -     B12  Osteoporosis -     DG Bone Density; Future -     DG Bone Density  Need for hepatitis C screening test -     Hepatitis C Antibody  Screening  for lipid disorders -     Lipid panel  Other orders -     hydrocortisone 2.5 % ointment; Apply to affected area BID. -     diclofenac sodium (VOLTAREN) 1 % GEL; Apply 4 g topically 4 (four) times daily.      Plan:     1. Myalgia and myositis - Discussed with patient that her biceps muscle pain on the right-side may be secondary to tendinitis. Patient was given information on additional exercises that she can do and to apply ice/heat therapy as needed. Patient has appointment with Dr. Georgina Snell in sports medicine for further evaluation. Will discuss need for further medical intervention such as PT referral and brace upon consultation with Dr. Georgina Snell. Patient given prescription for voltaren 1% gel for symptomatic relief. Patient to get serum Ck, ferritin, vitamin D, and B12 for further evaluation.   2. Osteoporosis - Patient was given requisition form to get screen DEXA scan. Will determine need for further medical intervention upon receiving results. Will continue to monitor. After bone density will attempt to get prolia approved what she was on before she moved.   3. Preventative medicine - Patient to get screening done for Hep C and lipid disorders. Will call patient with laboratory results. Will determine need for further medical intervention upon receiving results.  4. Contact dermatitis - Patient given prescription for hydrocortisone 2.5% topical ointment. Patient to return-to-clinic if symptoms persist or worsen.

## 2015-12-26 ENCOUNTER — Ambulatory Visit (INDEPENDENT_AMBULATORY_CARE_PROVIDER_SITE_OTHER): Payer: BLUE CROSS/BLUE SHIELD

## 2015-12-26 DIAGNOSIS — Z1231 Encounter for screening mammogram for malignant neoplasm of breast: Secondary | ICD-10-CM | POA: Diagnosis not present

## 2015-12-26 DIAGNOSIS — M818 Other osteoporosis without current pathological fracture: Secondary | ICD-10-CM | POA: Diagnosis not present

## 2015-12-26 DIAGNOSIS — M810 Age-related osteoporosis without current pathological fracture: Secondary | ICD-10-CM

## 2015-12-26 IMAGING — MG DIGITAL SCREENING BILATERAL MAMMOGRAM WITH CAD
5 series · 5 of 5 positions shown · non-contrast
Comparison: Previous exam(s).

CLINICAL DATA: Screening.

EXAM:
DIGITAL SCREENING BILATERAL MAMMOGRAM WITH CAD

[R CC]
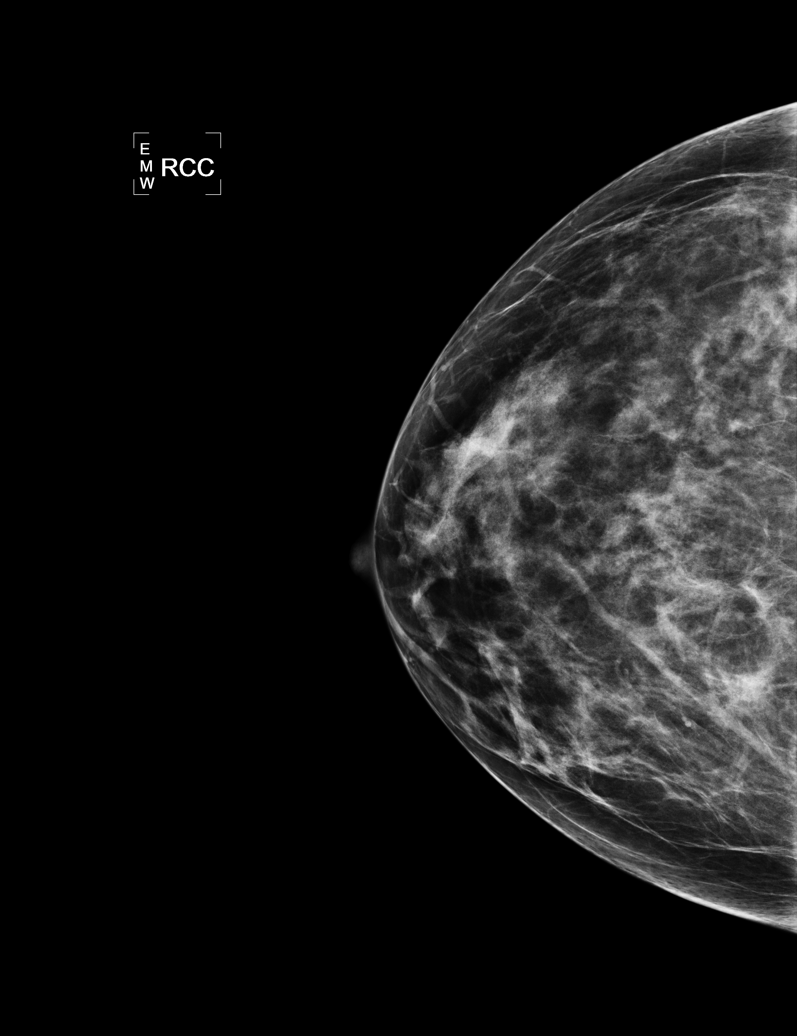

[L CC]
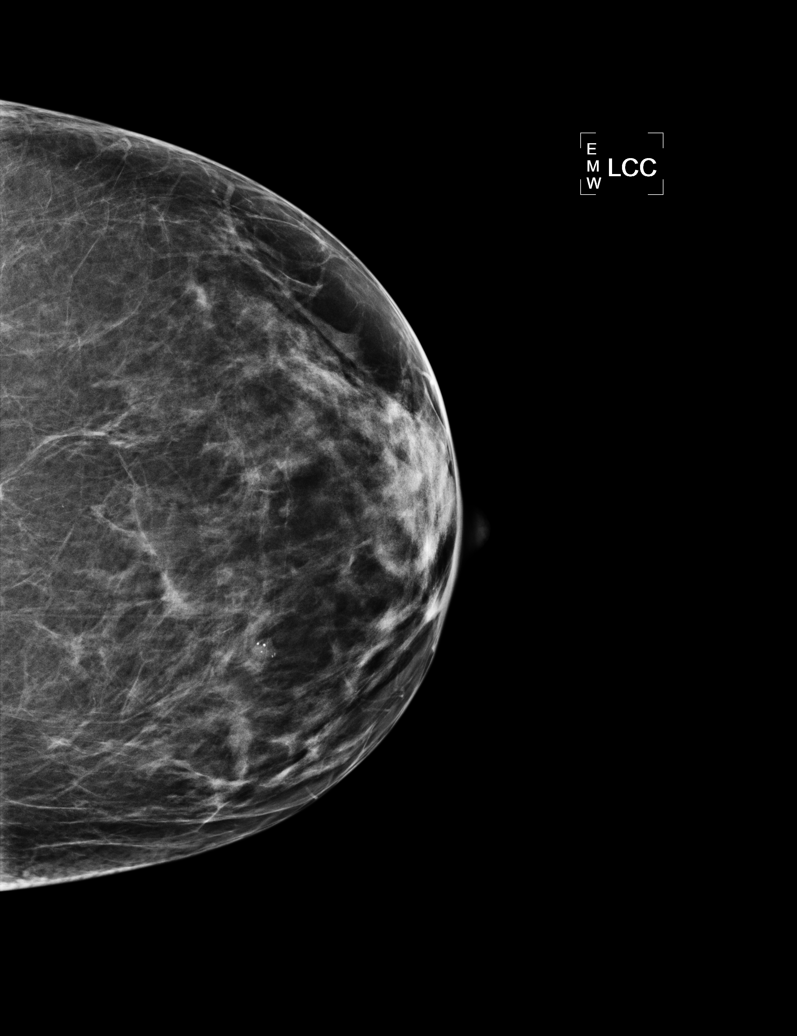

[L MLO (1 of 2)]
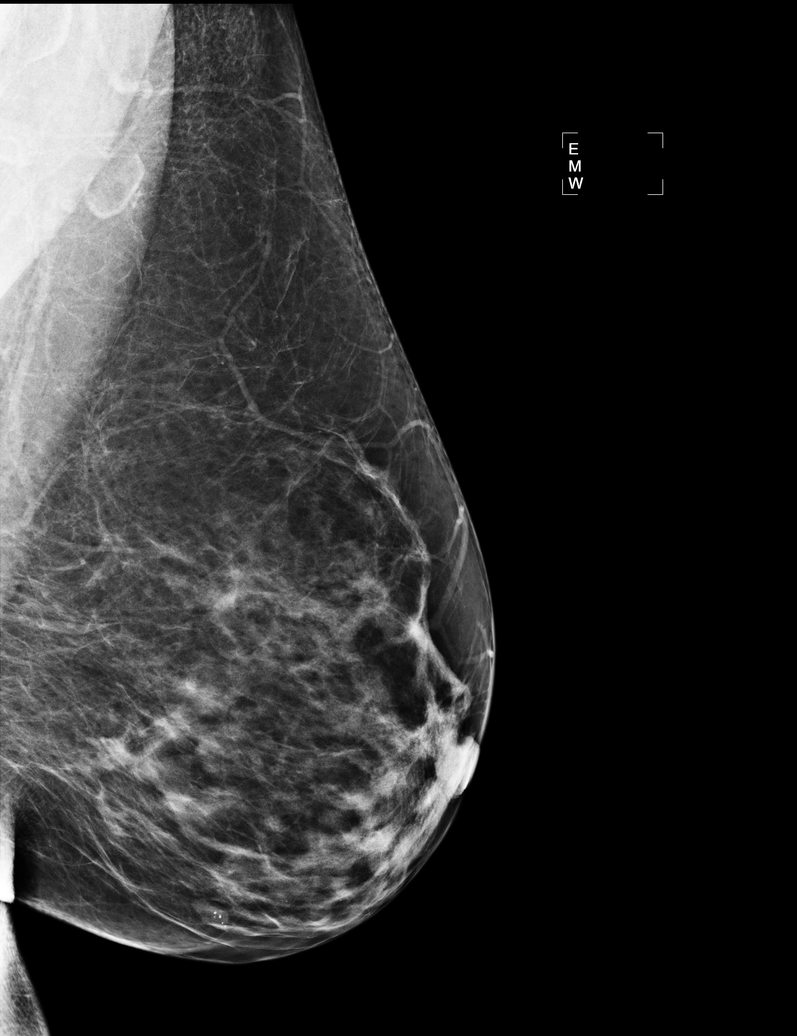

[R MLO]
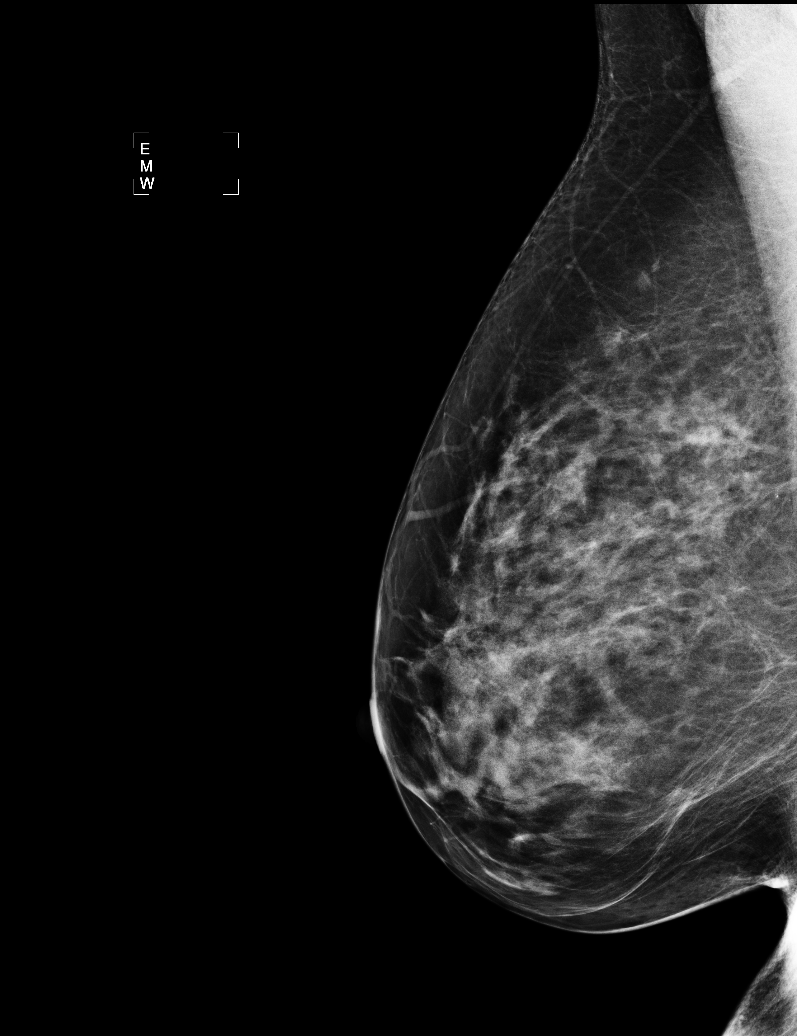

[L MLO (2 of 2)]
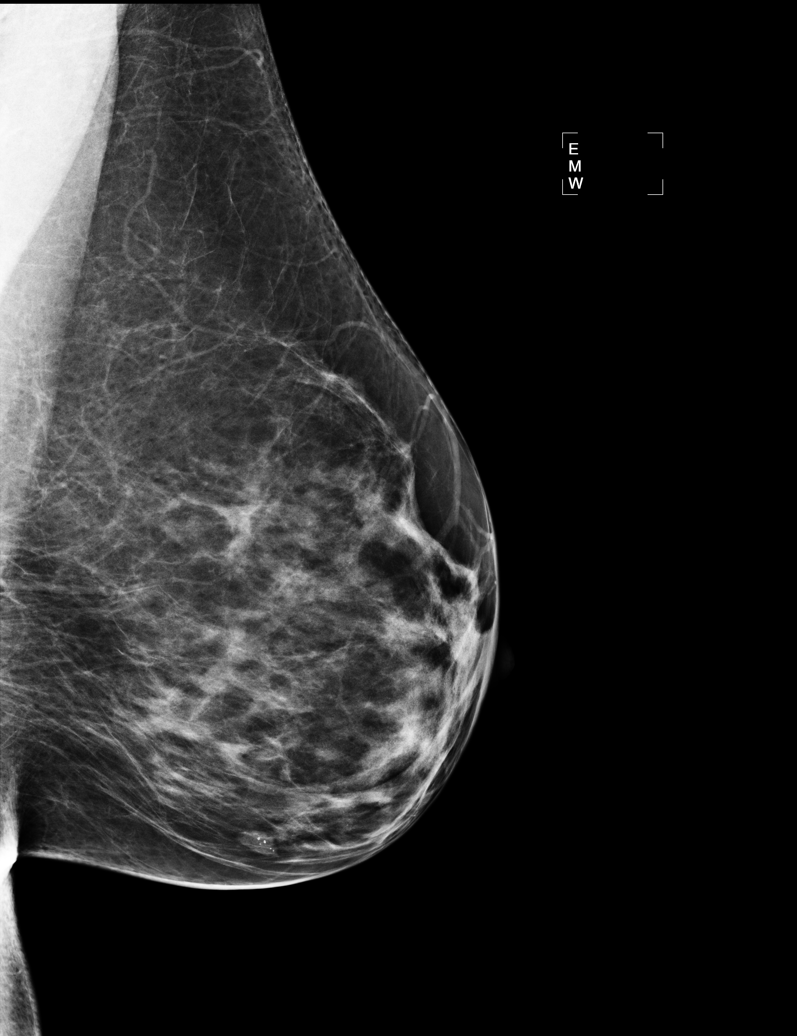

[5 of 5 positions shown; findings below may reference images not displayed]

ACR Breast Density Category c: The breast tissue is heterogeneously
dense, which may obscure small masses.
FINDINGS: There are no findings suspicious for malignancy. Images were
processed with CAD.
IMPRESSION: No mammographic evidence of malignancy. A result letter of this
screening mammogram will be mailed directly to the patient.

RECOMMENDATION:
Screening mammogram in one year. (Code:[id number])

BI-RADS CATEGORY  1: Negative.

## 2015-12-28 DIAGNOSIS — M609 Myositis, unspecified: Secondary | ICD-10-CM | POA: Diagnosis not present

## 2015-12-28 DIAGNOSIS — Z13220 Encounter for screening for lipoid disorders: Secondary | ICD-10-CM | POA: Diagnosis not present

## 2015-12-28 DIAGNOSIS — Z1159 Encounter for screening for other viral diseases: Secondary | ICD-10-CM | POA: Diagnosis not present

## 2015-12-28 DIAGNOSIS — M791 Myalgia: Secondary | ICD-10-CM | POA: Diagnosis not present

## 2015-12-29 LAB — LIPID PANEL
CHOLESTEROL: 243 mg/dL — AB (ref 125–200)
HDL: 68 mg/dL (ref >=46)
LDL CALC: 146 mg/dL — AB (ref <130)
Total CHOL/HDL Ratio: 3.6 Ratio (ref <=5.0)
Triglycerides: 143 mg/dL (ref <150)
VLDL: 29 mg/dL (ref <30)

## 2015-12-29 LAB — FERRITIN: Ferritin: 38 ng/mL (ref 20–288)

## 2015-12-29 LAB — CK: Total CK: 88 U/L (ref 7–177)

## 2015-12-29 LAB — HEPATITIS C ANTIBODY: HCV Ab: NEGATIVE

## 2015-12-29 LAB — VITAMIN D 25 HYDROXY (VIT D DEFICIENCY, FRACTURES): VIT D 25 HYDROXY: 39 ng/mL (ref 30–100)

## 2015-12-29 LAB — VITAMIN B12: VITAMIN B 12: 468 pg/mL (ref 200–1100)

## 2016-01-01 ENCOUNTER — Other Ambulatory Visit: Payer: Self-pay | Admitting: Physician Assistant

## 2016-01-01 ENCOUNTER — Encounter: Payer: Self-pay | Admitting: Rehabilitative and Restorative Service Providers"

## 2016-01-01 ENCOUNTER — Ambulatory Visit (INDEPENDENT_AMBULATORY_CARE_PROVIDER_SITE_OTHER): Payer: BLUE CROSS/BLUE SHIELD | Admitting: Rehabilitative and Restorative Service Providers"

## 2016-01-01 DIAGNOSIS — R293 Abnormal posture: Secondary | ICD-10-CM | POA: Diagnosis not present

## 2016-01-01 DIAGNOSIS — R29898 Other symptoms and signs involving the musculoskeletal system: Secondary | ICD-10-CM

## 2016-01-01 DIAGNOSIS — M25551 Pain in right hip: Secondary | ICD-10-CM

## 2016-01-01 DIAGNOSIS — M25511 Pain in right shoulder: Secondary | ICD-10-CM

## 2016-01-01 NOTE — Patient Instructions (Signed)
  Outer Hip Stretch: Reclined IT Band Stretch (Strap)   Strap around one foot, pull leg across body until you feel a pull or stretch, with shoulders on mat. Hold for 30 seconds. Repeat 3 times each leg. 2-3 times/day.  Piriformis Stretch   Lying on back, pull right knee toward opposite shoulder. Hold 30 seconds. Repeat 3 times. Do 2-3 sessions per day.   Self massage using ~ 4 inch rubber ball or "The stick"   Trigger Point Dry Needling  . What is Trigger Point Dry Needling (DN)? o DN is a physical therapy technique used to treat muscle pain and dysfunction. Specifically, DN helps deactivate muscle trigger points (muscle knots).  o A thin filiform needle is used to penetrate the skin and stimulate the underlying trigger point. The goal is for a local twitch response (LTR) to occur and for the trigger point to relax. No medication of any kind is injected during the procedure.   . What Does Trigger Point Dry Needling Feel Like?  o The procedure feels different for each individual patient. Some patients report that they do not actually feel the needle enter the skin and overall the process is not painful. Very mild bleeding may occur. However, many patients feel a deep cramping in the muscle in which the needle was inserted. This is the local twitch response.   Marland Kitchen How Will I feel after the treatment? o Soreness is normal, and the onset of soreness may not occur for a few hours. Typically this soreness does not last longer than two days.  o Bruising is uncommon, however; ice can be used to decrease any possible bruising.  o In rare cases feeling tired or nauseous after the treatment is normal. In addition, your symptoms may get worse before they get better, this period will typically not last longer than 24 hours.   . What Can I do After My Treatment? o Increase your hydration by drinking more water for the next 24 hours. o You may place ice or heat on the areas treated that have become  sore, however, do not use heat on inflamed or bruised areas. Heat often brings more relief post needling. o You can continue your regular activities, but vigorous activity is not recommended initially after the treatment for 24 hours. o DN is best combined with other physical therapy such as strengthening, stretching, and other therapies.

## 2016-01-01 NOTE — Therapy (Signed)
[facility] [place], [place], [address] Phone: [phone]   Fax:  [phone]  Physical Therapy Evaluation  Patient Details  Name: Leslie Roberson MRN: [id number] Date of Birth: [birth year] Referring Provider: Dr Georgina Snell   Encounter Date: 01/01/2016      PT End of Session - 01/01/16 1356    Visit Number 1   Number of Visits 12   Date for PT Re-Evaluation 02/12/16   PT Start Time 0934   PT Stop Time 1038   PT Time Calculation (min) 64 min   Activity Tolerance Patient tolerated treatment well      Past Medical History:  Diagnosis Date  . Depression   . Thyroid disease     Past Surgical History:  Procedure Laterality Date  . ABDOMINAL HYSTERECTOMY    . bladder mesh    . ELBOW SURGERY Right   . SPINAL FUSION    . TONSILLECTOMY    . TOTAL HIP ARTHROPLASTY      There were no vitals filed for this visit.       Subjective Assessment - 01/01/16 0941    Subjective Patient reports that she has been having painin the rt hip since 2/17 with known injury. Symptoms improved but have returned. Seen by MD and diagnosed with bursitis. She is using topical medication for the hip which has helped. She continues to have hip pain with functional activities. She also has Rt knee pain with lifting knee out of the car and moving Rt LE. She has Rt biceps and shoulder pain which has been present fot the past few months   Pertinent History Scoliosis; spinal fusion thoracic and lumbar in 1969 and 1970; chronic scapular and shoulder pain; lt hip replacement 1998; dislocated elbow ~6 years ago    How long can you sit comfortably? 30-60 min    How long can you stand comfortably? 5 min    How long can you walk comfortably? 1- 10 min    Diagnostic tests xrays    Patient Stated Goals decrease pain in knee and arm and improve hip - lessen pain in the Rt shoulder girdle    Currently in Pain? Yes   Pain Score 4    Pain  Location Hip   Pain Orientation Right   Pain Descriptors / Indicators Nagging;Aching   Pain Type Chronic pain   Pain Radiating Towards Rt knee    Pain Onset More than a month ago   Pain Frequency Intermittent   Aggravating Factors  standing; walking; dancing; descending stairs   Pain Relieving Factors topical cream   Multiple Pain Sites Yes   Pain Score 8   Pain Location Arm   Pain Orientation Right   Pain Descriptors / Indicators Aching;Sharp   Pain Type Chronic pain   Pain Radiating Towards shoulder blade    Pain Onset More than a month ago   Pain Frequency Intermittent   Aggravating Factors  twisting arm a certain way; lifting   Pain Relieving Factors nothing             [facility] PT Assessment - 01/01/16 0001      Assessment   Medical Diagnosis Rt hip bursitis; Rt arm/shoulder pain    Referring Provider Dr Georgina Snell    Onset Date/Surgical Date 05/02/15   Hand Dominance Right   Next MD Visit 01/22/16   Prior Therapy none for hip - yes for shoulder      Precautions   Precautions None  Balance Screen   Has the patient fallen in the past 6 months No   Has the patient had a decrease in activity level because of a fear of falling?  No   Is the patient reluctant to leave their home because of a fear of falling?  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    Leisure household chores and unpacking 0 just moved to area      Observation/Other Assessments   Focus on Therapeutic Outcomes (FOTO)  68% limitation      Sensation   Additional Comments WFL's per pt report      Posture/Postural Control   Posture Comments notable scoliosis with Rt convex thoracic Lt lumbar convex curvature; Rt upper quarter elevated Lt LE shorter than Rt; Lt hemipelvis posterior rotated; Rt shoulder forward      AROM    Overall AROM Comments hip ROM limited at end ranges esp in hip ext    Right Shoulder Extension 61 Degrees   Right Shoulder Flexion 127 Degrees   Right Shoulder ABduction 96 Degrees  pain shoulder biceps area    Right Shoulder Internal Rotation 10 Degrees   Right Shoulder External Rotation 79 Degrees   Left Shoulder Extension 52 Degrees   Left Shoulder Flexion 113 Degrees   Left Shoulder ABduction 108 Degrees   Left Shoulder Internal Rotation 0 Degrees   Left Shoulder External Rotation 118 Degrees     Strength   Right/Left Shoulder --  5/5 bilat UE pain with resisted Rt abduction    Right/Left Hip --  5/5 except bilat hip ext 5-/5 Lt; 4+/5 Rt; Rt hip abduction    Right/Left Knee --  4+/5 to 5-/5 Rt knee flex and 5-/5 to 5/5 knee ext      Flexibility   Hamstrings ~100 deg bilat    Quadriceps 100-110 bilat knee flex in prone    ITB tight Rt    Piriformis tight Rt      Palpation   Palpation comment tenderness to palpation Rt posterior hip inc piriformis and hip glut medius; hip flexors; TFL/ITB; Rt pecs/upper trap/leveator/biceps tendon                    [facility] Adult PT Treatment/Exercise - 01/01/16 0001      Knee/Hip Exercises: Stretches   ITB Stretch 3 reps;60 seconds   Piriformis Stretch 3 reps;30 seconds     Moist Heat Therapy   Number Minutes Moist Heat 15 Minutes   Moist Heat Location Shoulder;Lumbar Spine;Hip  Rt                PT Education - 01/01/16 1011    Education provided Yes   Education Details HEP; TDN   Person(s) Educated Patient   Methods Explanation;Demonstration;Tactile cues;Verbal cues;Handout   Comprehension Verbalized understanding;Returned demonstration;Verbal cues required;Tactile cues required             PT Long Term Goals - 01/01/16 1410      PT LONG TERM GOAL #1   Title Improve muscular extensibility and ROM through Rt shoulder girdle and hip musculature to palpation 02/12/16   Time 6   Period Weeks   Status  New     PT LONG TERM GOAL #2   Title Increase shoulder ROM by  5-10 degrees bilat flexion Rt ER/abd 02/12/16   Time 6   Period Weeks   Status New     PT LONG TERM GOAL #3   Title Decrease frequency; intensity and duration of pain in the Rt shd/hip 02/12/16   Time 6   Period Weeks   Status New     PT LONG TERM GOAL #4   Title Independent in HEP 02/12/16   Time 6   Period Weeks   Status New     PT LONG TERM GOAL #5   Title Improve FOTO to </= 42% limitation 02/12/16   Time 6   Period Weeks   Status New               Plan - 01/01/16 1357    Clinical Impression Statement Patient presents with Rt hip and LE pain as well as Rt anterior shouler pain. She has postural changes associated with scoliosis and spinal fusion. She has limited mobilty through U/LE's and trunk; muscular tightness to palpation; decreased ROM; decresaed strength; pain with functional activities; limited activity level related to physical findings.    Rehab Potential Good   PT Frequency 2x / week   PT Duration 6 weeks   PT Treatment/Interventions Patient/family education;ADLs/Self Care Home Management;Neuromuscular re-education;Cryotherapy;Electrical Stimulation;Iontophoresis 4mg /ml Dexamethasone;Moist Heat;Ultrasound;Dry needling;Manual techniques;Therapeutic activities;Therapeutic exercise   PT Next Visit Plan progress with stretching and strengthening - stretch Rt biceps/pecs/upper trap; Rt hip flexors; core stabilization; LE strengthening; posterior shoulder girdle strengthening; manual work v TDN for muscular tightness Rt shoudler girdle/hip; modalities   Consulted and Agree with Plan of Care Patient      Patient will benefit from skilled therapeutic intervention in order to improve the following deficits and impairments:  Postural dysfunction, Improper body mechanics, Pain, Decreased range of motion, Decreased mobility, Decreased strength, Decreased activity tolerance  Visit Diagnosis: Pain in right  hip - Plan: PT plan of care cert/re-cert  Acute pain of right shoulder - Plan: PT plan of care cert/re-cert  Abnormal posture - Plan: PT plan of care cert/re-cert  Other symptoms and signs involving the musculoskeletal system - Plan: PT plan of care cert/re-cert     Problem List Patient Active Problem List   Diagnosis Date Noted  . Hip bursitis 12/25/2015  . Biceps tendonitis on right 12/25/2015  . Diverticulitis of colon 12/21/2015  . GERD (gastroesophageal reflux disease) 11/27/2015  . Osteoporosis 11/27/2015  . GAD (generalized anxiety disorder) 11/27/2015  . MDD (major depressive disorder), recurrent, in full remission ([place]) 11/27/2015  . Chronic dryness of both eyes 11/27/2015  . Hypothyroidism 11/27/2015  . Asthma, chronic 11/27/2015  . Insomnia 11/27/2015  . Seborrheic keratoses 11/27/2015    Leslie Roberson Nilda Simmer PT, MPH 01/01/2016, 2:17 PM  [facility] [place], [place], [address] Phone: [phone]   Fax:  [phone]  Name: Leslie Roberson MRN: [id number] Date of Birth: 1955/06/09

## 2016-01-03 ENCOUNTER — Ambulatory Visit (INDEPENDENT_AMBULATORY_CARE_PROVIDER_SITE_OTHER): Payer: BLUE CROSS/BLUE SHIELD | Admitting: Physical Therapy

## 2016-01-03 DIAGNOSIS — R29898 Other symptoms and signs involving the musculoskeletal system: Secondary | ICD-10-CM

## 2016-01-03 DIAGNOSIS — R293 Abnormal posture: Secondary | ICD-10-CM

## 2016-01-03 DIAGNOSIS — M25511 Pain in right shoulder: Secondary | ICD-10-CM

## 2016-01-03 DIAGNOSIS — M25551 Pain in right hip: Secondary | ICD-10-CM

## 2016-01-03 NOTE — Patient Instructions (Signed)
Decompression Exercise: Basic     [place] [phone]   Lie on back on firm surface, knees bent, feet flat, arms turned up, out to sides, backs of hands down. Time _1-5__ minutes. Once a day. Surface: floor  Shoulder Press   Press both shoulders down. Hold _5__ seconds. Repeat _10__ times. Once a day. Surface: floor   Head Press With Chin Tuck   Tuck chin SLIGHTLY toward chest, keep mouth closed. Feel weight on back of head. Increase weight by pressing head down. Hold _5__ seconds. Relax. Repeat _10__ times. Once a day. Surface: floor   Leg Lengthener: Full   Straighten one leg. Pull toes AND forefoot toward knee, extend heel. Lengthen leg by pulling pelvis away from ribs. Hold _5__ seconds. Relax. Repeat 10 times. Re-bend knee. Do other leg.  Surface: floor   Leg Press: Single   Straighten one leg down to floor. Bring toes AND forefoot toward knee, extend heel. Press leg down. DO NOT BEND KNEE. Hold _5__ seconds. Relax leg. Repeat exercise 10 times. Relax leg.  Repeat with other leg. Once a day.  Surface: floor

## 2016-01-03 NOTE — Therapy (Signed)
[facility] [place], [place], [address] Phone: [phone]   Fax:  [phone]  Physical Therapy Treatment  Patient Details  Name: Leslie Roberson MRN: [id number] Date of Birth: December 09, 1955 Referring Provider: Dr Georgina Snell   Encounter Date: 01/03/2016      PT End of Session - 01/03/16 1401    Visit Number 2   Number of Visits 12   Date for PT Re-Evaluation 02/12/16   PT Start Time 1401   PT Stop Time 5809   PT Time Calculation (min) 56 min      Past Medical History:  Diagnosis Date  . Depression   . Thyroid disease     Past Surgical History:  Procedure Laterality Date  . ABDOMINAL HYSTERECTOMY    . bladder mesh    . ELBOW SURGERY Right   . SPINAL FUSION    . TONSILLECTOMY    . TOTAL HIP ARTHROPLASTY      There were no vitals filed for this visit.      Subjective Assessment - 01/03/16 1401    Subjective [name] said the exercises are going well, she is sore today in the posterior Rt shoulder and lateral Rt knee, the topical gel on her hip seem to be helping.    Currently in Pain? Yes   Pain Score 4    Pain Location Knee   Pain Orientation Right;Lateral   Pain Descriptors / Indicators Aching   Pain Type Chronic pain   Pain Onset More than a month ago   Pain Frequency Intermittent   Pain Score 6   Pain Location Shoulder   Pain Orientation Right;Posterior   Pain Descriptors / Indicators Aching   Pain Type Chronic pain   Pain Onset More than a month ago   Pain Frequency Intermittent                         [facility] Adult PT Treatment/Exercise - 01/03/16 0001      Knee/Hip Exercises: Aerobic   Nustep L4 x 5' alt FWD/BWD     Knee/Hip Exercises: Supine   Other Supine Knee/Hip Exercises 10 reps, 5 sec, leg presses, leg lengtheners     Shoulder Exercises: Supine   External Rotation Strengthening;Both;Theraband  3x6   Theraband Level (Shoulder External Rotation) Level 1  (Yellow)   Other Supine Exercises 10 reps, 5sec holds head presses, shoulder presses.      Shoulder Exercises: Stretch   Other Shoulder Stretches doorway stretch low.      Modalities   Modalities Moist Heat     Moist Heat Therapy   Number Minutes Moist Heat 10 Minutes   Moist Heat Location Shoulder;Knee  thoracic, ice pack to belly to keep from getting hot.      Manual Therapy   Manual Therapy Soft tissue mobilization   Soft tissue mobilization --  Rt deltoid          Trigger Point Dry Needling - 01/03/16 1448    Consent Given? Yes   Education Handout Provided Yes   Muscles Treated Upper Body --  Rt deltoid - good twitch response              PT Education - 01/03/16 1417    Education provided Yes   Education Details HEP supine postural alignment   Person(s) Educated Patient   Methods Explanation;Demonstration   Comprehension Returned demonstration;Verbalized understanding  PT Long Term Goals - 01/03/16 1418      PT LONG TERM GOAL #1   Title Improve muscular extensibility and ROM through Rt shoulder girdle and hip musculature to palpation 02/12/16   Status On-going     PT LONG TERM GOAL #2   Title Increase shoulder ROM by 5-10 degrees bilat flexion Rt ER/abd 02/12/16   Status On-going     PT LONG TERM GOAL #3   Title Decrease frequency; intensity and duration of pain in the Rt shd/hip 02/12/16   Status On-going     PT LONG TERM GOAL #4   Title Independent in HEP 02/12/16   Status On-going     PT LONG TERM GOAL #5   Title Improve FOTO to </= 42% limitation 02/12/16   Status On-going               Plan - 01/03/16 1419    Clinical Impression Statement This is Stanton Kidney Anne's second visit, no goals met. She tolerated postural realignment exercises well.    Rehab Potential Good   PT Frequency 2x / week   PT Duration 6 weeks   PT Treatment/Interventions Patient/family education;ADLs/Self Care Home Management;Neuromuscular  re-education;Cryotherapy;Electrical Stimulation;Iontophoresis 50m/ml Dexamethasone;Moist Heat;Ultrasound;Dry needling;Manual techniques;Therapeutic activities;Therapeutic exercise   PT Next Visit Plan progress with stretching and strengthening - stretch Rt biceps/pecs/upper trap; Rt hip flexors; core stabilization; LE strengthening; posterior shoulder girdle strengthening; manual work v TDN for muscular tightness Rt shoudler girdle/hip; modalities   Consulted and Agree with Plan of Care Patient      Patient will benefit from skilled therapeutic intervention in order to improve the following deficits and impairments:  Postural dysfunction, Improper body mechanics, Pain, Decreased range of motion, Decreased mobility, Decreased strength, Decreased activity tolerance  Visit Diagnosis: Pain in right hip  Acute pain of right shoulder  Abnormal posture  Other symptoms and signs involving the musculoskeletal system     Problem List Patient Active Problem List   Diagnosis Date Noted  . Hip bursitis 12/25/2015  . Biceps tendonitis on right 12/25/2015  . Diverticulitis of colon 12/21/2015  . GERD (gastroesophageal reflux disease) 11/27/2015  . Osteoporosis 11/27/2015  . GAD (generalized anxiety disorder) 11/27/2015  . MDD (major depressive disorder), recurrent, in full remission ([place] 11/27/2015  . Chronic dryness of both eyes 11/27/2015  . Hypothyroidism 11/27/2015  . Asthma, chronic 11/27/2015  . Insomnia 11/27/2015  . Seborrheic keratoses 11/27/2015    SJeral PinchPT  01/03/2016, 3:32 PM  [facility] [place] [address]Phone: [phone]  Fax:  [phone] Name: Leslie McwethyMRN: [id number] of Birth: 611-15-1957

## 2016-01-04 ENCOUNTER — Encounter: Payer: Self-pay | Admitting: Physician Assistant

## 2016-01-07 ENCOUNTER — Ambulatory Visit (INDEPENDENT_AMBULATORY_CARE_PROVIDER_SITE_OTHER): Payer: BLUE CROSS/BLUE SHIELD | Admitting: Rehabilitative and Restorative Service Providers"

## 2016-01-07 ENCOUNTER — Encounter: Payer: Self-pay | Admitting: Rehabilitative and Restorative Service Providers"

## 2016-01-07 DIAGNOSIS — R29898 Other symptoms and signs involving the musculoskeletal system: Secondary | ICD-10-CM | POA: Diagnosis not present

## 2016-01-07 DIAGNOSIS — R293 Abnormal posture: Secondary | ICD-10-CM | POA: Diagnosis not present

## 2016-01-07 DIAGNOSIS — M25511 Pain in right shoulder: Secondary | ICD-10-CM

## 2016-01-07 DIAGNOSIS — M25551 Pain in right hip: Secondary | ICD-10-CM

## 2016-01-07 NOTE — Therapy (Signed)
[place], [place], [address] Phone: [phone]   Fax:  [phone]  Physical Therapy Treatment  Patient Details  Name: Leslie Roberson MRN: [id number] Date of Birth: Oct 22, 1955 Referring Provider: Dr Georgina Snell   Encounter Date: 01/07/2016      PT End of Session - 01/07/16 1154    Visit Number 3   Number of Visits 12   Date for PT Re-Evaluation 02/12/16   PT Start Time 1152   PT Stop Time 1249   PT Time Calculation (min) 57 min   Activity Tolerance Patient tolerated treatment well      Past Medical History:  Diagnosis Date  . Depression   . Thyroid disease     Past Surgical History:  Procedure Laterality Date  . ABDOMINAL HYSTERECTOMY    . bladder mesh    . ELBOW SURGERY Right   . SPINAL FUSION    . TONSILLECTOMY    . TOTAL HIP ARTHROPLASTY      There were no vitals filed for this visit.      Subjective Assessment - 01/07/16 1154    Subjective Patient reports that the TDN helped her arm "a lot". She is still having pain in the Rt shoulder blade area and is ready to try the needling for that area.    Currently in Pain? Yes   Pain Score 6    Pain Location Hip   Pain Orientation Right;Lateral   Pain Descriptors / Indicators Aching   Pain Type Chronic pain   Pain Onset More than a month ago   Pain Frequency Intermittent   Pain Score 9   Pain Location Shoulder   Pain Orientation Right;Posterior   Pain Type Chronic pain   Pain Onset More than a month ago   Pain Frequency Intermittent                         [facility] Adult PT Treatment/Exercise - 01/07/16 0001      Knee/Hip Exercises: Stretches   ITB Stretch 3 reps;60 seconds   Piriformis Stretch 3 reps;30 seconds     Shoulder Exercises: Supine   External Rotation Strengthening;Both;Theraband  10 x 2 sets   Theraband Level (Shoulder External Rotation) Level 1 (Yellow)   Other Supine Exercises diagonal Lt  hip to above Rt ear w/ Rt UE x 10 2 sets yellow TB      Shoulder Exercises: ROM/Strengthening   UBE (Upper Arm Bike) L2 3 min alt fwd/back      Shoulder Exercises: Stretch   Other Shoulder Stretches doorway stretch low.      Moist Heat Therapy   Number Minutes Moist Heat 15 Minutes   Moist Heat Location Shoulder;Hip;Knee  Rt      Electrical Stimulation   Electrical Stimulation Location Rt shoulder girdle    Electrical Stimulation Action IFC   Electrical Stimulation Parameters  to tolerance   Electrical Stimulation Goals Pain;Tone     Manual Therapy   Manual therapy comments pt Lt sidelying; supine    Soft tissue mobilization working through the Rt shoulder girdle - upper traps/leveator/pecs/deltoid/periscapular musculature    Myofascial Release upper trap/posterior shoulder    Scapular Mobilization inferior glide           Trigger Point Dry Needling - 01/07/16 1247    Consent Given? Yes   Muscles Treated Upper Body Upper trapezius;Levator scapulae;Rhomboids;Supraspinatus;Subscapularis  Deltoid all on Rt - patient Lt sidelying for TDN  Upper Trapezius Response Palpable increased muscle length   Levator Scapulae Response Palpable increased muscle length   Rhomboids Response Palpable increased muscle length   Supraspinatus Response Palpable increased muscle length   Subscapularis Response Palpable increased muscle length              PT Education - 01/07/16 1252    Education provided Yes   Education Details HEP    Person(s) Educated Patient   Methods Explanation;Demonstration;Tactile cues;Verbal cues;Handout   Comprehension Verbalized understanding;Returned demonstration;Verbal cues required;Tactile cues required             PT Long Term Goals - 01/03/16 1418      PT LONG TERM GOAL #1   Title Improve muscular extensibility and ROM through Rt shoulder girdle and hip musculature to palpation 02/12/16   Status On-going     PT LONG TERM GOAL #2   Title  Increase shoulder ROM by 5-10 degrees bilat flexion Rt ER/abd 02/12/16   Status On-going     PT LONG TERM GOAL #3   Title Decrease frequency; intensity and duration of pain in the Rt shd/hip 02/12/16   Status On-going     PT LONG TERM GOAL #4   Title Independent in HEP 02/12/16   Status On-going     PT LONG TERM GOAL #5   Title Improve FOTO to </= 42% limitation 02/12/16   Status On-going               Plan - 01/07/16 1252    Clinical Impression Statement Unable to do exercises at home - out of town this weekend. Will work on the exercises this week. Positive response to TDN - worked through  the [place] shoulder girdle with TDN and manual work today with good response.    Rehab Potential Good   PT Frequency 2x / week   PT Treatment/Interventions Patient/family education;ADLs/Self Care Home Management;Neuromuscular re-education;Cryotherapy;Electrical Stimulation;Iontophoresis 4mg /ml Dexamethasone;Moist Heat;Ultrasound;Dry needling;Manual techniques;Therapeutic activities;Therapeutic exercise   PT Next Visit Plan progress with stretching and strengthening - stretch Rt biceps/pecs/upper trap; Rt hip flexors; core stabilization; LE strengthening; posterior shoulder girdle strengthening; manual work v TDN for muscular tightness Rt shoudler girdle/hip; modalities . Assess repsonse to TDN and possibly try TDN for Rt hip at next visit   Consulted and Agree with Plan of Care Patient      Patient will benefit from skilled therapeutic intervention in order to improve the following deficits and impairments:  Postural dysfunction, Improper body mechanics, Pain, Decreased range of motion, Decreased mobility, Decreased strength, Decreased activity tolerance  Visit Diagnosis: Pain in right hip  Acute pain of right shoulder  Abnormal posture  Other symptoms and signs involving the musculoskeletal system     Problem List Patient Active Problem List   Diagnosis Date Noted  . Hip bursitis  12/25/2015  . Biceps tendonitis on right 12/25/2015  . Diverticulitis of colon 12/21/2015  . GERD (gastroesophageal reflux disease) 11/27/2015  . Osteoporosis 11/27/2015  . GAD (generalized anxiety disorder) 11/27/2015  . MDD (major depressive disorder), recurrent, in full remission ([place]) 11/27/2015  . Chronic dryness of both eyes 11/27/2015  . Hypothyroidism 11/27/2015  . Asthma, chronic 11/27/2015  . Insomnia 11/27/2015  . Seborrheic keratoses 11/27/2015    Najai Waszak Nilda Simmer PT, MPH  01/07/2016, 1:04 PM  [facility] [place], [place], [address] Phone: [phone]   Fax:  [phone]  Name: Leslie Roberson MRN: [id number] Date of Birth: 04-Jul-1955

## 2016-01-07 NOTE — Patient Instructions (Addendum)
  Resisted External Rotation: in Neutral - Bilateral   Lying on back elbows bent at 90 degrees PALMS UP Sit or stand, tubing in both hands, elbows at sides, bent to 90, forearms forward. Pinch shoulder blades together and rotate forearms out. Keep elbows at sides. Repeat __10__ times per set. Do _2-3___ sets per session. Do _2-3___ sessions per day.   Lateral Deltoid Raise: Standing - Single Arm (yellow theraband)    Lying on back with Knees bent, move arm out across body and up. Do __2-3__ sets. Complete __10__ repetitions. Yellow theraband

## 2016-01-10 ENCOUNTER — Ambulatory Visit (INDEPENDENT_AMBULATORY_CARE_PROVIDER_SITE_OTHER): Payer: BLUE CROSS/BLUE SHIELD | Admitting: Physical Therapy

## 2016-01-10 DIAGNOSIS — R29898 Other symptoms and signs involving the musculoskeletal system: Secondary | ICD-10-CM | POA: Diagnosis not present

## 2016-01-10 DIAGNOSIS — M25551 Pain in right hip: Secondary | ICD-10-CM

## 2016-01-10 DIAGNOSIS — M25511 Pain in right shoulder: Secondary | ICD-10-CM | POA: Diagnosis not present

## 2016-01-10 DIAGNOSIS — R293 Abnormal posture: Secondary | ICD-10-CM

## 2016-01-10 NOTE — Therapy (Signed)
[place], [place], [address] Phone: [phone]   Fax:  [phone]  Physical Therapy Treatment  Patient Details  Name: Leslie Roberson MRN: [id number] Date of Birth: 10-15-1955 Referring Provider: Dr. Georgina Snell  Encounter Date: 01/10/2016      PT End of Session - 01/10/16 1244    Visit Number 4   Number of Visits 12   Date for PT Re-Evaluation 02/12/16   PT Start Time 1151   PT Stop Time 1249   PT Time Calculation (min) 58 min   Activity Tolerance Patient tolerated treatment well   Behavior During Therapy [facility] for tasks assessed/performed      Past Medical History:  Diagnosis Date  . Depression   . Thyroid disease     Past Surgical History:  Procedure Laterality Date  . ABDOMINAL HYSTERECTOMY    . bladder mesh    . ELBOW SURGERY Right   . SPINAL FUSION    . TONSILLECTOMY    . TOTAL HIP ARTHROPLASTY      There were no vitals filed for this visit.      Subjective Assessment - 01/10/16 1154    Subjective Pt reports the TDN helped last sessions.  Streches have helped.  She has done some of the band exercises, "but probably not as much as I should be".    Pertinent History Scoliosis; spinal fusion thoracic and lumbar in 1969 and 1970; chronic scapular and shoulder pain; lt hip replacement 1998; dislocated elbow ~6 years ago    Currently in Pain? Yes   Pain Score 7    Pain Location Shoulder  upper trap   Pain Orientation Right   Pain Descriptors / Indicators Tightness   Aggravating Factors  laying on shoulder    Pain Relieving Factors massage    Pain Score 2  up to 9/10 when getting out of car   Pain Location Knee   Pain Orientation Right   Pain Descriptors / Indicators Sharp;Aching;Stabbing   Aggravating Factors  twisting leg to get out of car   Pain Relieving Factors avoiding the motions that hurt it.             [facility] PT Assessment - 01/10/16 0001      Assessment    Medical Diagnosis Rt hip bursitis; Rt arm/shoulder pain    Referring Provider Dr. Georgina Snell   Onset Date/Surgical Date 05/02/15   Hand Dominance Right   Next MD Visit 01/22/16   Prior Therapy none for hip - yes for shoulder      AROM   Right Shoulder Flexion 138 Degrees  supine    Right Shoulder ABduction 85 Degrees  supine, with pain   Right Shoulder External Rotation 55 Degrees  supine. arm abdct ~60 deg          [facility] Adult PT Treatment/Exercise - 01/10/16 0001      Shoulder Exercises: Supine   External Rotation Strengthening;Both;Theraband  10 x 2 sets   Theraband Level (Shoulder External Rotation) Level 1 (Yellow)   Flexion Both;10 reps;Theraband  overhead pull   Theraband Level (Shoulder Flexion) Level 1 (Yellow)   Other Supine Exercises shoulder press (retraction) x 5 sec hold x 12 reps; head presses x 5 sec hold x 10 reps    Other Supine Exercises Sash RUE with yellow band x 10 reps, 2 sets     Shoulder Exercises: ROM/Strengthening   UBE (Upper Arm Bike) L1 3 min alt fwd/back  Shoulder Exercises: Stretch   Other Shoulder Stretches doorway stretch low x 3 reps; VC to hold stretch and for proper form      Moist Heat Therapy   Number Minutes Moist Heat 15 Minutes   Moist Heat Location Shoulder;Hip;Knee  Rt      Electrical Stimulation   Electrical Stimulation Location Rt shoulder    Electrical Stimulation Action IFC   Electrical Stimulation Parameters  to tolerance    Electrical Stimulation Goals Pain;Tone     Manual Therapy   Manual Therapy Taping   Soft tissue mobilization Edge tool assistance to Rt upper trap/ periscapular muscles to decrease fascial tightness.     Myofascial Release to Rt lateral quad /ITB    Kinesiotex Inhibit Muscle     Kinesiotix   Inhibit Muscle  I strip of Rock tape placed with 10% stretch to Rt distal ITB.   perpendicular I strip placed just distal to knee to decompress tissue.    Tape applied to decrease pain and increase  proprioception.                       PT Long Term Goals - 01/03/16 1418      PT LONG TERM GOAL #1   Title Improve muscular extensibility and ROM through Rt shoulder girdle and hip musculature to palpation 02/12/16   Status On-going     PT LONG TERM GOAL #2   Title Increase shoulder ROM by 5-10 degrees bilat flexion Rt ER/abd 02/12/16   Status On-going     PT LONG TERM GOAL #3   Title Decrease frequency; intensity and duration of pain in the Rt shd/hip 02/12/16   Status On-going     PT LONG TERM GOAL #4   Title Independent in HEP 02/12/16   Status On-going     PT LONG TERM GOAL #5   Title Improve FOTO to </= 42% limitation 02/12/16   Status On-going               Plan - 01/10/16 1247    Clinical Impression Statement Pt continues with limited Rt shoulder ROM, especially abduction and ER.  She tolerated exercises in supine well, with minimal increase in pain. She reported reduction in pain in shoulder (Rt) and elimination of pain in Rt knee.  Progressing gradually towards established goals.    Rehab Potential Good   PT Frequency 2x / week   PT Duration 6 weeks   PT Treatment/Interventions Patient/family education;ADLs/Self Care Home Management;Neuromuscular re-education;Cryotherapy;Electrical Stimulation;Iontophoresis 4mg /ml Dexamethasone;Moist Heat;Ultrasound;Dry needling;Manual techniques;Therapeutic activities;Therapeutic exercise   PT Next Visit Plan Continue progressive ROM/ strengthening to Rt shoulder.  Possibly TDN to Rt shoulder girdle.  Assess response to Rock Tape to distal ITB.     Consulted and Agree with Plan of Care Patient      Patient will benefit from skilled therapeutic intervention in order to improve the following deficits and impairments:  Postural dysfunction, Improper body mechanics, Pain, Decreased range of motion, Decreased mobility, Decreased strength, Decreased activity tolerance  Visit Diagnosis: Acute pain of right  shoulder  Pain in right hip  Abnormal posture  Other symptoms and signs involving the musculoskeletal system     Problem List Patient Active Problem List   Diagnosis Date Noted  . Hip bursitis 12/25/2015  . Biceps tendonitis on right 12/25/2015  . Diverticulitis of colon 12/21/2015  . GERD (gastroesophageal reflux disease) 11/27/2015  . Osteoporosis 11/27/2015  . GAD (generalized anxiety disorder) 11/27/2015  . MDD (  major depressive disorder), recurrent, in full remission ([place]) 11/27/2015  . Chronic dryness of both eyes 11/27/2015  . Hypothyroidism 11/27/2015  . Asthma, chronic 11/27/2015  . Insomnia 11/27/2015  . Seborrheic keratoses 11/27/2015   Kerin Perna, PTA 01/10/16 12:59 PM  [place], [place], [address] Phone: [phone]   Fax:  [phone]  Name: Leslie Roberson MRN: [id number] Date of Birth: November 13, 1955

## 2016-01-16 ENCOUNTER — Encounter: Payer: Self-pay | Admitting: Rehabilitative and Restorative Service Providers"

## 2016-01-16 ENCOUNTER — Encounter: Payer: Self-pay | Admitting: Physician Assistant

## 2016-01-16 ENCOUNTER — Ambulatory Visit (INDEPENDENT_AMBULATORY_CARE_PROVIDER_SITE_OTHER): Payer: BLUE CROSS/BLUE SHIELD | Admitting: Rehabilitative and Restorative Service Providers"

## 2016-01-16 DIAGNOSIS — M25551 Pain in right hip: Secondary | ICD-10-CM | POA: Diagnosis not present

## 2016-01-16 DIAGNOSIS — M25511 Pain in right shoulder: Secondary | ICD-10-CM

## 2016-01-16 DIAGNOSIS — R293 Abnormal posture: Secondary | ICD-10-CM | POA: Diagnosis not present

## 2016-01-16 DIAGNOSIS — R29898 Other symptoms and signs involving the musculoskeletal system: Secondary | ICD-10-CM | POA: Diagnosis not present

## 2016-01-16 NOTE — Therapy (Signed)
[place], [place], [address] Phone: [phone]   Fax:  [phone]  Physical Therapy Treatment  Patient Details  Name: Leslie Roberson MRN: [id number] Date of Birth: 08-Oct-1955 Referring Provider: Dr. Georgina Snell  Encounter Date: 01/16/2016      PT End of Session - 01/16/16 1028    Visit Number 5   Number of Visits 12   Date for PT Re-Evaluation 02/12/16   PT Start Time 1016   PT Stop Time 1115   PT Time Calculation (min) 59 min   Activity Tolerance Patient tolerated treatment well      Past Medical History:  Diagnosis Date  . Depression   . Thyroid disease     Past Surgical History:  Procedure Laterality Date  . ABDOMINAL HYSTERECTOMY    . bladder mesh    . ELBOW SURGERY Right   . SPINAL FUSION    . TONSILLECTOMY    . TOTAL HIP ARTHROPLASTY      There were no vitals filed for this visit.      Subjective Assessment - 01/16/16 1029    Subjective Thinks the TDN really helps. Not sure about the taping. The one on her hip came off in a couple of days but the one on her back is still there.    Pain Score 8    Pain Location Shoulder   Pain Orientation Right   Pain Descriptors / Indicators Tightness;Aching   Pain Score 0  at rest 7/10 with movement - returns to pain free after a few seconds   Pain Location Knee   Pain Orientation Right   Pain Descriptors / Indicators Aching                         [facility] Adult PT Treatment/Exercise - 01/16/16 0001      Knee/Hip Exercises: Aerobic   Stationary Bike Nustep L5 UE 5 x 5 min      Knee/Hip Exercises: Standing   Hip Abduction Right;Left;2 sets;10 reps   Hip Extension Right;Left;2 sets;10 reps     Shoulder Exercises: Stretch   Other Shoulder Stretches doorway stretch low x 3 reps; VC to hold stretch and for proper form      Moist Heat Therapy   Number Minutes Moist Heat 20 Minutes   Moist Heat Location  Shoulder;Hip;Knee  Rt      Electrical Stimulation   Electrical Stimulation Location Rt shoulder/Rt hip    Electrical Stimulation Action IFC   Electrical Stimulation Parameters to tolerance   Electrical Stimulation Goals Pain;Tone     Manual Therapy   Soft tissue mobilization Rt posterior shoulder girdle - upper trap; periscapular area; teres; lats: Rt hip abductors - piriformis; glut med/max; TFL; ITB    Myofascial Release Rt posterior shoudler girdle; Rt hip    Scapular Mobilization inferior glide           Trigger Point Dry Needling - 01/16/16 1136    Consent Given? Yes   Muscles Treated Upper Body --  Rt  - (+) teres    Muscles Treated Lower Body Gluteus maximus;Piriformis;Tensor fascia lata  Rt (+) ITB    Upper Trapezius Response Palpable increased muscle length   Levator Scapulae Response Palpable increased muscle length   Subscapularis Response Palpable increased muscle length   Gluteus Maximus Response Palpable increased muscle length   Piriformis Response Palpable increased muscle length   Tensor Fascia Lata Response Palpable increased muscle  length              PT Education - 01/16/16 1138    Education provided Yes   Education Details HEP   Person(s) Educated Patient   Methods Explanation;Demonstration;Tactile cues;Verbal cues;Handout   Comprehension Verbalized understanding;Returned demonstration;Verbal cues required;Tactile cues required             PT Long Term Goals - 01/16/16 1142      PT LONG TERM GOAL #1   Title Improve muscular extensibility and ROM through Rt shoulder girdle and hip musculature to palpation 02/12/16   Time 6   Period Weeks   Status On-going     PT LONG TERM GOAL #2   Title Increase shoulder ROM by 5-10 degrees bilat flexion Rt ER/abd 02/12/16   Time 6   Period Weeks   Status On-going     PT LONG TERM GOAL #3   Title Decrease frequency; intensity and duration of pain in the Rt shd/hip 02/12/16   Time 6   Period  Weeks   Status On-going     PT LONG TERM GOAL #4   Title Independent in HEP 02/12/16   Time 6   Period Weeks   Status On-going     PT LONG TERM GOAL #5   Title Improve FOTO to </= 42% limitation 02/12/16   Time 6   Period Weeks   Status On-going               Plan - 01/16/16 1138    Clinical Impression Statement Kendahl Perezhernandez reports some improvement in shoulder pain and discomfort. She is doing her exercises at home. Added LE exercises without difficulty and pt could feel the exercise working through the hips. Patient responds well to TDN and manual work.    Rehab Potential Good   PT Frequency 2x / week   PT Duration 6 weeks   PT Treatment/Interventions Patient/family education;ADLs/Self Care Home Management;Neuromuscular re-education;Cryotherapy;Electrical Stimulation;Iontophoresis 4mg /ml Dexamethasone;Moist Heat;Ultrasound;Dry needling;Manual techniques;Therapeutic activities;Therapeutic exercise   PT Next Visit Plan Continue progressive ROM/ strengthening to Rt shoulder.  TDN to Rt shoulder girdle/Rt hip. Continue Rock Tape to distal ITB and shoulder area as indicated    Consulted and Agree with Plan of Care Patient      Patient will benefit from skilled therapeutic intervention in order to improve the following deficits and impairments:  Postural dysfunction, Improper body mechanics, Pain, Decreased range of motion, Decreased mobility, Decreased strength, Decreased activity tolerance  Visit Diagnosis: Acute pain of right shoulder  Pain in right hip  Abnormal posture  Other symptoms and signs involving the musculoskeletal system     Problem List Patient Active Problem List   Diagnosis Date Noted  . Hip bursitis 12/25/2015  . Biceps tendonitis on right 12/25/2015  . Diverticulitis of colon 12/21/2015  . GERD (gastroesophageal reflux disease) 11/27/2015  . Osteoporosis 11/27/2015  . GAD (generalized anxiety disorder) 11/27/2015  . MDD (major depressive  disorder), recurrent, in full remission ([place]) 11/27/2015  . Chronic dryness of both eyes 11/27/2015  . Hypothyroidism 11/27/2015  . Asthma, chronic 11/27/2015  . Insomnia 11/27/2015  . Seborrheic keratoses 11/27/2015    Eva Griffo Nilda Simmer PT, MPH  01/16/2016, 11:44 AM  [facility] [place], [place], [address] Phone: [phone]   Fax:  [phone]  Name: Jainaba Digangi MRN: [id number] Date of Birth: February 23, 1956

## 2016-01-16 NOTE — Patient Instructions (Addendum)
Relax right shoulder    Strengthening: Hip Abduction - Resisted    With tubing around right leg, other side toward anchor, extend leg out from side. Repeat __10__ times per set. Do __2-3__ sets per session. Do __1__ sessions per day.    Strengthening: Hip Extension - Resisted    With tubing around right ankle, face anchor and pull leg straight back. Repeat ___10_ times per set. Do __2-3__ sets per session. Do __1__ sessions per day.

## 2016-01-17 MED ORDER — BUSPIRONE HCL 30 MG PO TABS: 30.0000 mg | ORAL_TABLET | Freq: Two times a day (BID) | ORAL | 0 refills | Status: DC

## 2016-01-18 ENCOUNTER — Encounter: Payer: Self-pay | Admitting: Rehabilitative and Restorative Service Providers"

## 2016-01-18 ENCOUNTER — Ambulatory Visit (INDEPENDENT_AMBULATORY_CARE_PROVIDER_SITE_OTHER): Payer: BLUE CROSS/BLUE SHIELD | Admitting: Rehabilitative and Restorative Service Providers"

## 2016-01-18 DIAGNOSIS — R29898 Other symptoms and signs involving the musculoskeletal system: Secondary | ICD-10-CM | POA: Diagnosis not present

## 2016-01-18 DIAGNOSIS — M25511 Pain in right shoulder: Secondary | ICD-10-CM

## 2016-01-18 DIAGNOSIS — R293 Abnormal posture: Secondary | ICD-10-CM

## 2016-01-18 DIAGNOSIS — M25551 Pain in right hip: Secondary | ICD-10-CM

## 2016-01-18 NOTE — Therapy (Signed)
[place], [place], [address] Phone: [phone]   Fax:  [phone]  Physical Therapy Treatment  Patient Details  Name: Leslie Roberson MRN: [id number] Date of Birth: 28-Apr-1955 Referring Provider: Dr Lynne Leader  Encounter Date: 01/18/2016      PT End of Session - 01/18/16 1113    Visit Number 6   Number of Visits 12   Date for PT Re-Evaluation 02/12/16   PT Start Time [id number]   PT Stop Time 1114   PT Time Calculation (min) 59 min   Activity Tolerance Patient tolerated treatment well      Past Medical History:  Diagnosis Date  . Depression   . Thyroid disease     Past Surgical History:  Procedure Laterality Date  . ABDOMINAL HYSTERECTOMY    . bladder mesh    . ELBOW SURGERY Right   . SPINAL FUSION    . TONSILLECTOMY    . TOTAL HIP ARTHROPLASTY      There were no vitals filed for this visit.      Subjective Assessment - 01/18/16 1030    Subjective Patient reports that she is sore - especially through the the Rt shoulder/trap area. Did some dancing last night and she is sore in the hamstrings.    Currently in Pain? Yes   Pain Score 9    Pain Location Shoulder   Pain Orientation Right   Pain Type Chronic pain   Pain Onset More than a month ago   Pain Frequency Intermittent   Multiple Pain Sites Yes   Pain Score 7   Pain Location Hip   Pain Orientation Right   Pain Descriptors / Indicators Aching   Pain Type Chronic pain   Pain Onset More than a month ago   Pain Frequency Intermittent   Aggravating Factors  walking any distance   Pain Score 0   Pain Location Knee   Pain Orientation Right   Pain Descriptors / Indicators Aching   Pain Onset More than a month ago   Pain Frequency Intermittent   Aggravating Factors  twisting to get out of the car    Pain Relieving Factors avoiding irritating movements             [facility] PT Assessment - 01/18/16 0001      Assessment    Medical Diagnosis Rt hip bursitis; Rt arm/shoulder pain    Referring Provider Dr Lynne Leader   Onset Date/Surgical Date 05/02/15   Hand Dominance Right   Next MD Visit 01/22/16   Prior Therapy none for hip - yes for shoulder      AROM   Right Shoulder Extension 61 Degrees   Right Shoulder Flexion 131 Degrees   Right Shoulder ABduction 104 Degrees   Right Shoulder Internal Rotation 33 Degrees   Right Shoulder External Rotation 65 Degrees   Left Shoulder Extension 52 Degrees   Left Shoulder Flexion 113 Degrees   Left Shoulder ABduction 108 Degrees   Left Shoulder Internal Rotation 0 Degrees   Left Shoulder External Rotation 118 Degrees     Strength   Right/Left Shoulder --  5/5 bilat she abd/flex/ER    Right/Left Hip --  5/5 bilat hips except Rt hip ext and abd 5-/5    Right/Left Knee --  5-/5 knee flexion; 5/5 knee ext bilat      Flexibility   Hamstrings ~100 deg bilat    Quadriceps 110 bilat knee flex in prone  Palpation   Palpation comment tenderness to palpation Rt posterior hip inc piriformis and hip glut medius; hip flexors; TFL/ITB; Rt pecs/upper trap/leveator/biceps tendon                      [facility] Adult PT Treatment/Exercise - 01/18/16 0001      Knee/Hip Exercises: Stretches   ITB Stretch 3 reps;60 seconds   Piriformis Stretch 3 reps;30 seconds     Knee/Hip Exercises: Aerobic   Stationary Bike Nustep L5 UE 6 x 5 min      Knee/Hip Exercises: Standing   Hip Abduction Right;Left;2 sets;10 reps  with red TB above knees    Hip Extension Right;Left;2 sets;10 reps  with red TB above knees      Knee/Hip Exercises: Supine   Bridges Limitations Bridging 5 sec hold x 10      Knee/Hip Exercises: Prone   Straight Leg Raises Left;Right;10 reps     Shoulder Exercises: Standing   Extension Both;20 reps;Theraband   Theraband Level (Shoulder Extension) Level 2 (Red)   Row Both;20 reps;Theraband   Theraband Level (Shoulder Row) Level 2 (Red)    Retraction Both;20 reps;Theraband   Theraband Level (Shoulder Retraction) Level 1 (Yellow)     Shoulder Exercises: Pulleys   Flexion --  10 sec x 10      Shoulder Exercises: Stretch   Other Shoulder Stretches doorway stretch low x 3 reps; VC to hold stretch and for proper form      Moist Heat Therapy   Number Minutes Moist Heat 20 Minutes   Moist Heat Location Shoulder;Hip;Knee  Rt      Electrical Stimulation   Electrical Stimulation Location Rt shoulder/Rt hip    Electrical Stimulation Action IFC   Electrical Stimulation Parameters to tolerance   Electrical Stimulation Goals Pain;Tone                PT Education - 01/18/16 1100    Education provided Yes   Education Details HEP   Person(s) Educated Patient   Methods Explanation;Demonstration;Tactile cues;Verbal cues;Handout   Comprehension Verbalized understanding;Returned demonstration;Verbal cues required;Tactile cues required             PT Long Term Goals - 01/18/16 1117      PT LONG TERM GOAL #1   Title Improve muscular extensibility and ROM through Rt shoulder girdle and hip musculature to palpation 02/12/16   Time 6   Period Weeks   Status On-going     PT LONG TERM GOAL #2   Title Increase shoulder ROM by 5-10 degrees bilat flexion Rt ER/abd 02/12/16   Time 6   Period Weeks   Status On-going     PT LONG TERM GOAL #3   Title Decrease frequency; intensity and duration of pain in the Rt shd/hip 02/12/16   Time 6   Period Weeks   Status On-going     PT LONG TERM GOAL #4   Title Independent in HEP 02/12/16   Time 6   Period Weeks   Status On-going     PT LONG TERM GOAL #5   Title Improve FOTO to </= 42% limitation 02/12/16   Time 6   Period Weeks   Status On-going               Plan - 01/18/16 1114    Clinical Impression Statement Leslie Roberson reports some soreness following the TDN. She demonstrates improvement in ROM and strength and has decreased tightness to palpation. Patient  will benefit from continued therapy to accomplish goals of therapy and reach more independent ADL/home functional level.    Rehab Potential Good   PT Frequency 2x / week   PT Duration 6 weeks   PT Treatment/Interventions Patient/family education;ADLs/Self Care Home Management;Neuromuscular re-education;Cryotherapy;Electrical Stimulation;Iontophoresis 4mg /ml Dexamethasone;Moist Heat;Ultrasound;Dry needling;Manual techniques;Therapeutic activities;Therapeutic exercise   PT Next Visit Plan Continue progressive ROM/ strengthening to Rt shoulder.  TDN to Rt shoulder girdle/Rt hip. Continue Rock Tape to distal ITB and shoulder area as indicated    Consulted and Agree with Plan of Care Patient      Patient will benefit from skilled therapeutic intervention in order to improve the following deficits and impairments:  Postural dysfunction, Improper body mechanics, Pain, Decreased range of motion, Decreased mobility, Decreased strength, Decreased activity tolerance  Visit Diagnosis: Acute pain of right shoulder  Pain in right hip  Abnormal posture  Other symptoms and signs involving the musculoskeletal system     Problem List Patient Active Problem List   Diagnosis Date Noted  . Hip bursitis 12/25/2015  . Biceps tendonitis on right 12/25/2015  . Diverticulitis of colon 12/21/2015  . GERD (gastroesophageal reflux disease) 11/27/2015  . Osteoporosis 11/27/2015  . GAD (generalized anxiety disorder) 11/27/2015  . MDD (major depressive disorder), recurrent, in full remission ([place]) 11/27/2015  . Chronic dryness of both eyes 11/27/2015  . Hypothyroidism 11/27/2015  . Asthma, chronic 11/27/2015  . Insomnia 11/27/2015  . Seborrheic keratoses 11/27/2015    Fabyan Loughmiller Nilda Simmer PT, MPH  01/18/2016, 11:20 AM  [facility] [place], [place], [address] Phone: [phone]   Fax:  [phone]  Name: Leslie Roberson MRN:  [id number] Date of Birth: 06-Sep-1955

## 2016-01-18 NOTE — Patient Instructions (Addendum)
  Low Row: Standing   Face anchor, feet shoulder width apart. Palms up, pull arms back, squeezing shoulder blades together. Repeat 10__ times per set. Do 2-3__ sets per session. Do 1__ sessions per day. Anchor Height: Waist     Strengthening: Resisted Extension   Hold tubing in right hand, arm forward. Pull arm back, elbow straight. Repeat _10___ times per set. Do 2-3____ sets per session. Do 1____ sessions per day.   Straight Leg Raise (Prone)   You may need a pillow under your hips when on belly  Abdomen and head supported, keep left knee locked and raise leg at hip. Avoid arching low back. Repeat ___10_ times per set. Do __1-3__ sets per session. Do __1__ sessions per day.  Add red band to leg exercises in standing

## 2016-01-22 ENCOUNTER — Ambulatory Visit (INDEPENDENT_AMBULATORY_CARE_PROVIDER_SITE_OTHER): Payer: BLUE CROSS/BLUE SHIELD | Admitting: Family Medicine

## 2016-01-22 ENCOUNTER — Encounter: Payer: Self-pay | Admitting: Family Medicine

## 2016-01-22 VITALS — BP 115/85 | HR 96 | Wt 117.0 lb

## 2016-01-22 DIAGNOSIS — M7061 Trochanteric bursitis, right hip: Secondary | ICD-10-CM

## 2016-01-22 DIAGNOSIS — M7521 Bicipital tendinitis, right shoulder: Secondary | ICD-10-CM | POA: Diagnosis not present

## 2016-01-22 DIAGNOSIS — M41125 Adolescent idiopathic scoliosis, thoracolumbar region: Secondary | ICD-10-CM | POA: Diagnosis not present

## 2016-01-22 DIAGNOSIS — M4120 Other idiopathic scoliosis, site unspecified: Secondary | ICD-10-CM | POA: Insufficient documentation

## 2016-01-22 HISTORY — DX: Other idiopathic scoliosis, site unspecified: M41.20

## 2016-01-22 NOTE — Patient Instructions (Signed)
Thank you for coming in today. Continue PT.  Return as needed.

## 2016-01-22 NOTE — Progress Notes (Signed)
Leslie Roberson is a 60 y.o. female who presents to [facility] today for follow-up hip and arm pain. Patient has a history of scoliosis and was seen in late September for lateral hip pain and right shoulder pain thought to be due to trochanteric bursitis and possible rotator cuff tendinitis or bicipital tendinitis complicated by trapezius spasm. In the interim she has done very well and her pain has decreased but not gone away completely.   Past Medical History:  Diagnosis Date  . Depression   . Thyroid disease    Past Surgical History:  Procedure Laterality Date  . ABDOMINAL HYSTERECTOMY    . bladder mesh    . ELBOW SURGERY Right   . SPINAL FUSION    . TONSILLECTOMY    . TOTAL HIP ARTHROPLASTY     Social History  Substance Use Topics  . Smoking status: Never Smoker  . Smokeless tobacco: Never Used  . Alcohol use Yes     ROS:  As above   Medications: Current Outpatient Prescriptions  Medication Sig Dispense Refill  . acetaminophen-codeine (TYLENOL #3) 300-30 MG tablet Take by mouth as needed for moderate pain.    Marland Kitchen albuterol (PROVENTIL HFA;VENTOLIN HFA) 108 (90 Base) MCG/ACT inhaler Inhale into the lungs as needed for wheezing or shortness of breath.    . busPIRone (BUSPAR) 30 MG tablet Take 1 tablet (30 mg total) by mouth 2 (two) times daily. 60 tablet 0  . Carisoprodol (SOMA PO) Take by mouth 4 (four) times daily as needed.    . clonazePAM (KLONOPIN) 2 MG tablet TAKE 1 & 1/2 TABLET BY MOUTH EVERY DAY 45 tablet 0  . cycloSPORINE (RESTASIS) 0.05 % ophthalmic emulsion Place 1 drop into both eyes 2 (two) times daily.    Marland Kitchen denosumab (PROLIA) 60 MG/ML SOLN injection Inject 60 mg into the skin every 6 (six) months. Administer in upper arm, thigh, or abdomen    . desvenlafaxine (PRISTIQ) 100 MG 24 hr tablet Take 100 mg by mouth daily.    Marland Kitchen desvenlafaxine (PRISTIQ) 50 MG 24 hr tablet Take 50 mg by mouth daily.    Marland Kitchen dexlansoprazole  (DEXILANT) 60 MG capsule Take 60 mg by mouth daily.    . diclofenac sodium (VOLTAREN) 1 % GEL Apply 4 g topically 4 (four) times daily. 2 Tube 2  . flunisolide (NASAREL) 29 MCG/ACT (0.025%) nasal spray Place 2 sprays into the nose daily as needed for rhinitis. Dose is for each nostril.    . fluticasone furoate-vilanterol (BREO ELLIPTA) 100-25 MCG/INH AEPB Inhale 1 puff into the lungs daily.    . hydrocortisone 2.5 % ointment Apply to affected area BID. 30 g 1  . lamoTRIgine (LAMICTAL) 200 MG tablet TAKE ONE TABLET BY MOUTH AT BEDTIME 90 tablet 0  . levothyroxine (SYNTHROID, LEVOTHROID) 125 MCG tablet Take 62.5 mcg by mouth daily before breakfast.    . naproxen sodium (ANAPROX) 220 MG tablet Take 220 mg by mouth as needed.    . traZODone (DESYREL) 50 MG tablet TAKE ONE TABLET BY MOUTH AT BEDTIME 90 tablet 0   No current facility-administered medications for this visit.    Allergies  Allergen Reactions  . Adhesive [Tape]   . Demerol [Meperidine]   . Dilaudid [Hydromorphone]   . Levofloxacin      Exam:  BP 115/85   Pulse 96   Wt 117 lb (53.1 kg)   BMI 24.45 kg/m  General: Well Developed, well nourished, and in no  acute distress.  Neuro/Psych: Alert and oriented x3, extra-ocular muscles intact, able to move all 4 extremities, sensation grossly intact. Skin: Warm and dry, no rashes noted.  Respiratory: Not using accessory muscles, speaking in full sentences, trachea midline.  Cardiovascular: Pulses palpable, no extremity edema. Abdomen: Does not appear distended. MSK: Shoulder: Tender palpation right trapezius. Normal upper extremity motion. Negative impingement testing. Negative Yergason's and speeds testing.    No results found for this or any previous visit (from the past 48 hour(s)). No results found.    Assessment and Plan: 60 y.o. female with improving trochanteric bursitis and right shoulder pain. Continue physical therapy and home exercise program. Return as needed if  not improving.    No orders of the defined types were placed in this encounter.   Discussed warning signs or symptoms. Please see discharge instructions. Patient expresses understanding.

## 2016-01-23 ENCOUNTER — Encounter: Payer: Self-pay | Admitting: Rehabilitative and Restorative Service Providers"

## 2016-01-23 ENCOUNTER — Ambulatory Visit (INDEPENDENT_AMBULATORY_CARE_PROVIDER_SITE_OTHER): Payer: BLUE CROSS/BLUE SHIELD | Admitting: Rehabilitative and Restorative Service Providers"

## 2016-01-23 DIAGNOSIS — R29898 Other symptoms and signs involving the musculoskeletal system: Secondary | ICD-10-CM

## 2016-01-23 DIAGNOSIS — M25511 Pain in right shoulder: Secondary | ICD-10-CM

## 2016-01-23 DIAGNOSIS — R293 Abnormal posture: Secondary | ICD-10-CM

## 2016-01-23 DIAGNOSIS — M25551 Pain in right hip: Secondary | ICD-10-CM | POA: Diagnosis not present

## 2016-01-23 NOTE — Therapy (Signed)
[place], [place], [address] Phone: [phone]   Fax:  [phone]  Physical Therapy Treatment  Patient Details  Name: Leslie Roberson MRN: [id number] Date of Birth: 1955-08-11 Referring Provider: Dr Lynne Leader  Encounter Date: 01/23/2016      PT End of Session - 01/23/16 1647    Visit Number 7   Number of Visits 12   Date for PT Re-Evaluation 02/12/16   PT Start Time [id number]   PT Stop Time 1445   PT Time Calculation (min) 42 min   Activity Tolerance Patient tolerated treatment well      Past Medical History:  Diagnosis Date  . Depression   . Idiopathic scoliosis 01/22/2016  . Thyroid disease     Past Surgical History:  Procedure Laterality Date  . ABDOMINAL HYSTERECTOMY    . bladder mesh    . ELBOW SURGERY Right   . SPINAL FUSION    . TONSILLECTOMY    . TOTAL HIP ARTHROPLASTY      There were no vitals filed for this visit.      Subjective Assessment - 01/23/16 1641    Subjective Rt shoulder is sore but feels better with TDN. Has experienced some returnn of soreness and pain in the front of the Rt arm. Rt hip and knee feel "pretty good' Modified position of sitting to avoid crossing Rt LE under the Lt and that seems to have helped the Rt knee pain. Feels the TDN is very helpful. Can work on exercises at home and is doing this Will be travelling to a wedding this weekend. Anxious to see how she does with the travel and wedding activities including dancing    Currently in Pain? Yes   Pain Score 6    Pain Location Shoulder   Pain Orientation Right   Pain Descriptors / Indicators Aching;Tightness   Pain Score 4   Pain Location Hip   Pain Orientation Right   Pain Descriptors / Indicators Aching                         [facility] Adult PT Treatment/Exercise - 01/23/16 0001      Moist Heat Therapy   Number Minutes Moist Heat 20 Minutes   Moist Heat Location  Shoulder;Hip;Knee  Rt      Electrical Stimulation   Electrical Stimulation Location Rt shoulder girdle/Rt biceps    Electrical Stimulation Action IFC   Electrical Stimulation Parameters to tolerance   Electrical Stimulation Goals Pain;Tone     Manual Therapy   Soft tissue mobilization Rt posterior shoulder girdle - upper trap; periscapular area; teres; lats   Myofascial Release Rt posterior shoudler girdle   Scapular Mobilization inferior glide           Trigger Point Dry Needling - 01/23/16 1646    Consent Given? Yes   Muscles Treated Upper Body --  thoracic paraspinals - decreased muscular tightness    Upper Trapezius Response Palpable increased muscle length   Levator Scapulae Response Palpable increased muscle length   Rhomboids Response Palpable increased muscle length   Supraspinatus Response Palpable increased muscle length                   PT Long Term Goals - 01/18/16 1117      PT LONG TERM GOAL #1   Title Improve muscular extensibility and ROM through Rt shoulder girdle and hip musculature to palpation 02/12/16  Time 6   Period Weeks   Status On-going     PT LONG TERM GOAL #2   Title Increase shoulder ROM by 5-10 degrees bilat flexion Rt ER/abd 02/12/16   Time 6   Period Weeks   Status On-going     PT LONG TERM GOAL #3   Title Decrease frequency; intensity and duration of pain in the Rt shd/hip 02/12/16   Time 6   Period Weeks   Status On-going     PT LONG TERM GOAL #4   Title Independent in HEP 02/12/16   Time 6   Period Weeks   Status On-going     PT LONG TERM GOAL #5   Title Improve FOTO to </= 42% limitation 02/12/16   Time 6   Period Weeks   Status On-going               Plan - 01/23/16 1648    Clinical Impression Statement Good response to TDN and HEP. Improved muscular tightness and decreased pain.    Rehab Potential Good   PT Frequency 2x / week   PT Duration 6 weeks   PT Treatment/Interventions Patient/family  education;ADLs/Self Care Home Management;Neuromuscular re-education;Cryotherapy;Electrical Stimulation;Iontophoresis 4mg /ml Dexamethasone;Moist Heat;Ultrasound;Dry needling;Manual techniques;Therapeutic activities;Therapeutic exercise   PT Next Visit Plan Continue progressive ROM/ strengthening to Rt shoulder.  TDN to Rt shoulder girdle/Rt hip. Continue Rock Tape to distal ITB and shoulder area as indicated    Consulted and Agree with Plan of Care Patient      Patient will benefit from skilled therapeutic intervention in order to improve the following deficits and impairments:  Postural dysfunction, Improper body mechanics, Pain, Decreased range of motion, Decreased mobility, Decreased strength, Decreased activity tolerance  Visit Diagnosis: Acute pain of right shoulder  Pain in right hip  Abnormal posture  Other symptoms and signs involving the musculoskeletal system     Problem List Patient Active Problem List   Diagnosis Date Noted  . Idiopathic scoliosis 01/22/2016  . Hip bursitis 12/25/2015  . Biceps tendonitis on right 12/25/2015  . Diverticulitis of colon 12/21/2015  . GERD (gastroesophageal reflux disease) 11/27/2015  . Osteoporosis 11/27/2015  . GAD (generalized anxiety disorder) 11/27/2015  . MDD (major depressive disorder), recurrent, in full remission ([place]) 11/27/2015  . Chronic dryness of both eyes 11/27/2015  . Hypothyroidism 11/27/2015  . Asthma, chronic 11/27/2015  . Insomnia 11/27/2015  . Seborrheic keratoses 11/27/2015    Abena Erdman Nilda Simmer PT, MPH  01/23/2016, 4:50 PM  [facility] [place], [place], [address] Phone: [phone]   Fax:  [phone]  Name: Khanh Felicia MRN: [id number] Date of Birth: 09/09/1955

## 2016-01-30 ENCOUNTER — Other Ambulatory Visit (HOSPITAL_COMMUNITY)
Admission: RE | Admit: 2016-01-30 | Discharge: 2016-01-30 | Disposition: A | Discharge status: Home / Self Care | Payer: BLUE CROSS/BLUE SHIELD | Source: Ambulatory Visit | Attending: Physician Assistant | Admitting: Physician Assistant

## 2016-01-30 ENCOUNTER — Ambulatory Visit (INDEPENDENT_AMBULATORY_CARE_PROVIDER_SITE_OTHER): Payer: BLUE CROSS/BLUE SHIELD | Admitting: Physician Assistant

## 2016-01-30 ENCOUNTER — Encounter: Payer: Self-pay | Admitting: Physician Assistant

## 2016-01-30 ENCOUNTER — Encounter: Payer: BLUE CROSS/BLUE SHIELD | Admitting: Physical Therapy

## 2016-01-30 VITALS — BP 112/58 | HR 88 | Ht <= 58 in | Wt 119.0 lb

## 2016-01-30 DIAGNOSIS — Z01419 Encounter for gynecological examination (general) (routine) without abnormal findings: Secondary | ICD-10-CM | POA: Insufficient documentation

## 2016-01-30 DIAGNOSIS — Z23 Encounter for immunization: Secondary | ICD-10-CM | POA: Diagnosis not present

## 2016-01-30 DIAGNOSIS — N952 Postmenopausal atrophic vaginitis: Secondary | ICD-10-CM | POA: Diagnosis not present

## 2016-01-30 DIAGNOSIS — Z1151 Encounter for screening for human papillomavirus (HPV): Secondary | ICD-10-CM | POA: Diagnosis not present

## 2016-01-30 MED ORDER — ESTRADIOL 0.1 MG/GM VA CREA: 1.0000 | TOPICAL_CREAM | Freq: Every day | VAGINAL | 12 refills | Status: DC

## 2016-01-30 MED ORDER — TRAZODONE HCL 50 MG PO TABS: 50.0000 mg | ORAL_TABLET | Freq: Every day | ORAL | 1 refills | Status: DC

## 2016-01-30 NOTE — Patient Instructions (Signed)
Estradiol vaginal cream What is this medicine? ESTRADIOL (es tra DYE ole) contains the female hormone estrogen. It is used for symptoms of menopause, like vaginal dryness and irritation. This medicine may be used for other purposes; ask your health care provider or pharmacist if you have questions. What should I tell my health care provider before I take this medicine? They need to know if you have any of these conditions: -abnormal vaginal bleeding -blood vessel disease or blood clots -breast, cervical, endometrial, ovarian, liver, or uterine cancer -dementia -diabetes -gallbladder disease -heart disease or recent heart attack -high blood pressure -high cholesterol -high levels of calcium in the blood -hysterectomy -kidney disease -liver disease -migraine headaches -protein C deficiency -protein S deficiency -stroke -systemic lupus erythematosus (SLE) -tobacco smoker -an unusual or allergic reaction to estrogens, other hormones, soy, other medicines, foods, dyes, or preservatives -pregnant or trying to get pregnant -breast-feeding How should I use this medicine? This medicine is for use in the vagina only. Do not take by mouth. Follow the directions on the prescription label. Read package directions carefully before using. Use the special applicator supplied with the cream. Wash hands before and after use. Fill the applicator with the prescribed amount of cream. Lie on your back, part and bend your knees. Insert the applicator into the vagina and push the plunger to expel the cream into the vagina. Wash the applicator with warm soapy water and rinse well. Use exactly as directed for the complete length of time prescribed. Do not stop using except on the advice of your doctor or health care professional. A patient package insert for the product will be given with each prescription and refill. Read this sheet carefully each time. The sheet may change frequently. Talk to your  pediatrician regarding the use of this medicine in children. This medicine is not approved for use in children. Overdosage: If you think you have taken too much of this medicine contact a poison control center or emergency room at once. NOTE: This medicine is only for you. Do not share this medicine with others. What if I miss a dose? If you miss a dose, use it as soon as you can. If it is almost time for your next dose, use only that dose. Do not use double or extra doses. What may interact with this medicine? Do not take this medicine with any of the following medications: -aromatase inhibitors like aminoglutethimide, anastrozole, exemestane, letrozole, testolactone This medicine may also interact with the following medications: -barbiturates used for inducing sleep or treating seizures -carbamazepine -grapefruit juice -medicines for fungal infections like ketoconazole and itraconazole -raloxifene -rifabutin -rifampin -rifapentine -ritonavir -some antibiotics used to treat infections -St. John's Wort -tamoxifen -warfarin This list may not describe all possible interactions. Give your health care provider a list of all the medicines, herbs, non-prescription drugs, or dietary supplements you use. Also tell them if you smoke, drink alcohol, or use illegal drugs. Some items may interact with your medicine. What should I watch for while using this medicine? Visit your health care professional for regular checks on your progress. You will need a regular breast and pelvic exam. You should also discuss the need for regular mammograms with your health care professional, and follow his or her guidelines. This medicine can make your body retain fluid, making your fingers, hands, or ankles swell. Your blood pressure can go up. Contact your doctor or health care professional if you feel you are retaining fluid. If you have any reason to think   you are pregnant, stop taking this medicine at once and  contact your doctor or health care professional. Tobacco smoking increases the risk of getting a blood clot or having a stroke, especially if you are more than 60 years old. You are strongly advised not to smoke. If you wear contact lenses and notice visual changes, or if the lenses begin to feel uncomfortable, consult your eye care specialist. If you are going to have elective surgery, you may need to stop taking this medicine beforehand. Consult your health care professional for advice prior to scheduling the surgery. What side effects may I notice from receiving this medicine? Side effects that you should report to your doctor or health care professional as soon as possible: -allergic reactions like skin rash, itching or hives, swelling of the face, lips, or tongue -breast tissue changes or discharge -changes in vision -chest pain -confusion, trouble speaking or understanding -dark urine -general ill feeling or flu-like symptoms -light-colored stools -nausea, vomiting -pain, swelling, warmth in the leg -right upper belly pain -severe headaches -shortness of breath -sudden numbness or weakness of the face, arm or leg -trouble walking, dizziness, loss of balance or coordination -unusual vaginal bleeding -yellowing of the eyes or skin Side effects that usually do not require medical attention (report to your doctor or health care professional if they continue or are bothersome): -hair loss -increased hunger or thirst -increased urination -symptoms of vaginal infection like itching, irritation or unusual discharge -unusually weak or tired This list may not describe all possible side effects. Call your doctor for medical advice about side effects. You may report side effects to FDA at 1-800-FDA-1088. Where should I keep my medicine? Keep out of the reach of children. Store at room temperature between 15 and 30 degrees C (59 and 86 degrees F). Protect from temperatures above 40 degrees C  (104 degrees C). Do not freeze. Throw away any unused medicine after the expiration date. NOTE: This sheet is a summary. It may not cover all possible information. If you have questions about this medicine, talk to your doctor, pharmacist, or health care provider.    2016, Elsevier/Gold Standard. (2010-06-19 09:18:12)  

## 2016-01-31 ENCOUNTER — Encounter: Payer: Self-pay | Admitting: Physician Assistant

## 2016-01-31 DIAGNOSIS — N952 Postmenopausal atrophic vaginitis: Secondary | ICD-10-CM | POA: Insufficient documentation

## 2016-01-31 LAB — CYTOLOGY - PAP
Diagnosis: NEGATIVE
HPV (WINDOPATH): NOT DETECTED

## 2016-01-31 NOTE — Progress Notes (Signed)
   Subjective:    Patient ID: Leslie Roberson, female    DOB: [birth year], 60 y.o.   MRN: [id number]  HPI Pt is a post menopausal female who has had a hysterectomy that is here for a pap smear. She request pap today. She is sexually active with one partner but reports it is very painful;therefore, they do not have sex often.    Review of Systems  All other systems reviewed and are negative.      Objective:   Physical Exam  Constitutional: She is oriented to person, place, and time. She appears well-developed and well-nourished.  HENT:  Head: Normocephalic and atraumatic.  Cardiovascular: Normal rate, regular rhythm and normal heart sounds.   Pulmonary/Chest: Effort normal and breath sounds normal.  Abdominal: Soft. Bowel sounds are normal. She exhibits no distension and no mass. There is no tenderness. There is no rebound and no guarding.  Genitourinary:  Genitourinary Comments: Cervix absent.  Pale dry vaginal mucosa.  Speculum painful with entry.   Neurological: She is alert and oriented to person, place, and time.  Psychiatric: She has a normal mood and affect. Her behavior is normal.          Assessment & Plan:  Marland KitchenMarland KitchenDiagnoses and all orders for this visit:  Encounter for annual routine gynecological examination -     Cytology - PAP  Need for Tdap vaccination -     Tdap vaccine greater than or equal to 7yo IM  Vaginal atrophy  Other orders -     traZODone (DESYREL) 50 MG tablet; Take 1 tablet (50 mg total) by mouth at bedtime. -     estradiol (ESTRACE VAGINAL) 0.1 MG/GM vaginal cream; Place 1 Applicatorful vaginally at bedtime.   Discussed with patient this is likely last pap smear. She is aware of the very small risk of abnormal vaginal cells without a cervix.  She declined STD testing.   Discussed treatment for vaginal atrophy. Will try estrace.discussed side effects and risk. HO given for review. Start daily for 1-2 weeks then decrease to 3 times a week.  Follow up in 6 months.

## 2016-02-01 ENCOUNTER — Ambulatory Visit (INDEPENDENT_AMBULATORY_CARE_PROVIDER_SITE_OTHER): Payer: BLUE CROSS/BLUE SHIELD | Admitting: Physical Therapy

## 2016-02-01 ENCOUNTER — Encounter: Payer: Self-pay | Admitting: Physical Therapy

## 2016-02-01 ENCOUNTER — Encounter: Payer: Self-pay | Admitting: Physician Assistant

## 2016-02-01 DIAGNOSIS — M25511 Pain in right shoulder: Secondary | ICD-10-CM

## 2016-02-01 DIAGNOSIS — M25551 Pain in right hip: Secondary | ICD-10-CM | POA: Diagnosis not present

## 2016-02-01 DIAGNOSIS — R293 Abnormal posture: Secondary | ICD-10-CM | POA: Diagnosis not present

## 2016-02-01 NOTE — Therapy (Signed)
[facility] [place], [place], [address] Phone: [phone]   Fax:  [phone]  Physical Therapy Treatment  Patient Details  Name: Leslie Roberson MRN: [id number] Date of Birth: 09-Apr-1955 Referring Provider: Dr Lynne Leader  Encounter Date: 02/01/2016      PT End of Session - 02/01/16 1254    Visit Number 8   Number of Visits 12   Date for PT Re-Evaluation 02/12/16   PT Start Time 1059   PT Stop Time 1144   PT Time Calculation (min) 45 min   Activity Tolerance Patient tolerated treatment well;No increased pain   Behavior During Therapy WFL for tasks assessed/performed      Past Medical History:  Diagnosis Date  . Depression   . Idiopathic scoliosis 01/22/2016  . Thyroid disease     Past Surgical History:  Procedure Laterality Date  . ABDOMINAL HYSTERECTOMY    . bladder mesh    . ELBOW SURGERY Right   . SPINAL FUSION    . TONSILLECTOMY    . TOTAL HIP ARTHROPLASTY      There were no vitals filed for this visit.      Subjective Assessment - 02/01/16 1100    Subjective Got back from wedding this week which seemed to go well. The shoulder is doing pretty good, a little sore in shoulderblade area. Hip is a little sore too.   Currently in Pain? Yes   Pain Score 8    Pain Location Shoulder   Pain Orientation Right;Posterior   Pain Descriptors / Indicators Aching;Tightness   Pain Type Chronic pain   Pain Frequency Intermittent   Aggravating Factors  sleeping on rt side   Pain Relieving Factors massage   Multiple Pain Sites Yes   Pain Score 6   Pain Location Hip   Pain Orientation Right   Pain Descriptors / Indicators Aching;Tingling   Pain Type Chronic pain   Aggravating Factors  laying on hard surface   Pain Relieving Factors stying off it                         [facility] Adult PT Treatment/Exercise - 02/01/16 0001      Knee/Hip Exercises: Stretches   ITB Stretch 3  reps;60 seconds     Knee/Hip Exercises: Aerobic   Stationary Bike Nustep L5 UE 6 x 5 min      Knee/Hip Exercises: Standing   Hip Abduction Right;Left;2 sets;10 reps  with red TB above knees      Knee/Hip Exercises: Supine   Bridges Limitations Bridging 5 sec hold x 10      Moist Heat Therapy   Number Minutes Moist Heat 15 Minutes   Moist Heat Location Shoulder     Electrical Stimulation   Electrical Stimulation Location Posterior rt shoulder   Electrical Stimulation Action IFC   Electrical Stimulation Parameters to pt comfort   Electrical Stimulation Goals Pain;Tone     Manual Therapy   Manual Therapy Soft tissue mobilization   Manual therapy comments TPR, STM to Rt upper trap, levator scapulae regions. Trial self STM with theracane.                PT Education - 02/01/16 1254    Education provided Yes   Education Details review of HEP   Person(s) Educated Patient   Methods Explanation;Demonstration;Tactile cues;Verbal cues   Comprehension Verbalized understanding;Returned demonstration  PT Long Term Goals - 01/18/16 1117      PT LONG TERM GOAL #1   Title Improve muscular extensibility and ROM through Rt shoulder girdle and hip musculature to palpation 02/12/16   Time 6   Period Weeks   Status On-going     PT LONG TERM GOAL #2   Title Increase shoulder ROM by 5-10 degrees bilat flexion Rt ER/abd 02/12/16   Time 6   Period Weeks   Status On-going     PT LONG TERM GOAL #3   Title Decrease frequency; intensity and duration of pain in the Rt shd/hip 02/12/16   Time 6   Period Weeks   Status On-going     PT LONG TERM GOAL #4   Title Independent in HEP 02/12/16   Time 6   Period Weeks   Status On-going     PT LONG TERM GOAL #5   Title Improve FOTO to </= 42% limitation 02/12/16   Time 6   Period Weeks   Status On-going               Plan - 02/01/16 1255    Clinical Impression Statement Pt reporting that she has noticed  gradual improvement and generally did well with recent trip. Pt admits to not working on HEP much with being gone. Session focused on reviewing HEP with modifications/technique corrections as needed. Pt educated/trial of theracane and use.    Rehab Potential Good   PT Frequency 2x / week   PT Duration 6 weeks   PT Treatment/Interventions Patient/family education;ADLs/Self Care Home Management;Neuromuscular re-education;Cryotherapy;Electrical Stimulation;Iontophoresis 4mg /ml Dexamethasone;Moist Heat;Ultrasound;Dry needling;Manual techniques;Therapeutic activities;Therapeutic exercise   PT Next Visit Plan work in hip and scapular stabilization with HEP and clinic program. Manual therapy and modalities as needed.    Consulted and Agree with Plan of Care Patient      Patient will benefit from skilled therapeutic intervention in order to improve the following deficits and impairments:  Postural dysfunction, Improper body mechanics, Pain, Decreased range of motion, Decreased mobility, Decreased strength, Decreased activity tolerance  Visit Diagnosis: Acute pain of right shoulder  Pain in right hip  Abnormal posture     Problem List Patient Active Problem List   Diagnosis Date Noted  . Vaginal atrophy 01/31/2016  . Idiopathic scoliosis 01/22/2016  . Hip bursitis 12/25/2015  . Biceps tendonitis on right 12/25/2015  . Diverticulitis of colon 12/21/2015  . GERD (gastroesophageal reflux disease) 11/27/2015  . Osteoporosis 11/27/2015  . GAD (generalized anxiety disorder) 11/27/2015  . MDD (major depressive disorder), recurrent, in full remission ([place]) 11/27/2015  . Chronic dryness of both eyes 11/27/2015  . Hypothyroidism 11/27/2015  . Asthma, chronic 11/27/2015  . Insomnia 11/27/2015  . Seborrheic keratoses 11/27/2015    Linard Millers, PT, CSCS 02/01/2016, 2:10 PM  [facility] [place], [place], [address] Phone:  [phone]   Fax:  [phone]  Name: Haruko Hern MRN: [id number] Date of Birth: [birth year]

## 2016-02-03 ENCOUNTER — Other Ambulatory Visit: Payer: Self-pay | Admitting: Physician Assistant

## 2016-02-04 ENCOUNTER — Other Ambulatory Visit: Payer: Self-pay | Admitting: Physician Assistant

## 2016-02-04 ENCOUNTER — Ambulatory Visit (INDEPENDENT_AMBULATORY_CARE_PROVIDER_SITE_OTHER): Payer: BLUE CROSS/BLUE SHIELD | Admitting: Physical Therapy

## 2016-02-04 ENCOUNTER — Ambulatory Visit (INDEPENDENT_AMBULATORY_CARE_PROVIDER_SITE_OTHER): Payer: BLUE CROSS/BLUE SHIELD | Admitting: Physician Assistant

## 2016-02-04 ENCOUNTER — Encounter: Payer: Self-pay | Admitting: Physical Therapy

## 2016-02-04 VITALS — BP 131/79 | HR 76

## 2016-02-04 DIAGNOSIS — M25511 Pain in right shoulder: Secondary | ICD-10-CM | POA: Diagnosis not present

## 2016-02-04 DIAGNOSIS — R29898 Other symptoms and signs involving the musculoskeletal system: Secondary | ICD-10-CM

## 2016-02-04 DIAGNOSIS — M25551 Pain in right hip: Secondary | ICD-10-CM | POA: Diagnosis not present

## 2016-02-04 DIAGNOSIS — Z23 Encounter for immunization: Secondary | ICD-10-CM

## 2016-02-04 DIAGNOSIS — Z2911 Encounter for prophylactic immunotherapy for respiratory syncytial virus (RSV): Secondary | ICD-10-CM | POA: Diagnosis not present

## 2016-02-04 DIAGNOSIS — R293 Abnormal posture: Secondary | ICD-10-CM

## 2016-02-04 DIAGNOSIS — L719 Rosacea, unspecified: Secondary | ICD-10-CM | POA: Insufficient documentation

## 2016-02-04 MED ORDER — METRONIDAZOLE 1 % EX GEL
Freq: Every day | CUTANEOUS | 0 refills | Status: DC
Start: 2016-02-04 — End: 2016-09-25

## 2016-02-04 NOTE — Progress Notes (Signed)
Complains of red cheeks and tiny bumps cheeks and chin. Will send metro gel for rosacea.

## 2016-02-04 NOTE — Therapy (Signed)
[facility] [place], [place], [address] Phone: [phone]   Fax:  [phone]  Physical Therapy Treatment  Patient Details  Name: Leslie Roberson MRN: [id number] Date of Birth: Nov 18, 1955 Referring Provider: Dr Lynne Leader  Encounter Date: 02/04/2016      PT End of Session - 02/04/16 1112    Visit Number 9   Number of Visits 12   Date for PT Re-Evaluation 02/12/16   PT Start Time 1019   PT Stop Time 1107   PT Time Calculation (min) 48 min   Equipment Utilized During Treatment Gait belt   Activity Tolerance Patient tolerated treatment well;No increased pain   Behavior During Therapy WFL for tasks assessed/performed      Past Medical History:  Diagnosis Date  . Depression   . Idiopathic scoliosis 01/22/2016  . Thyroid disease     Past Surgical History:  Procedure Laterality Date  . ABDOMINAL HYSTERECTOMY    . bladder mesh    . ELBOW SURGERY Right   . SPINAL FUSION    . TONSILLECTOMY    . TOTAL HIP ARTHROPLASTY      There were no vitals filed for this visit.      Subjective Assessment - 02/04/16 1024    Subjective Did good through Sunday but started getting sore last night. Hot some Rt knee pain at church Saturday night. No hip pain right now.    Currently in Pain? Yes   Pain Score 7    Pain Location Scapula   Pain Orientation Right   Pain Descriptors / Indicators Aching;Tightness   Pain Type Chronic pain   Aggravating Factors  sleeping on rt side   Pain Relieving Factors massage                         [facility] Adult PT Treatment/Exercise - 02/04/16 0001      Knee/Hip Exercises: Stretches   ITB Stretch 3 reps;60 seconds     Knee/Hip Exercises: Aerobic   Stationary Bike Nustep L5 UE 6 x 5 min      Knee/Hip Exercises: Standing   Hip Abduction Right;Left;2 sets;10 reps  with red TB above knees      Knee/Hip Exercises: Supine   Bridges Limitations Bridging 5 sec  hold 1x 10    Single Leg Bridge Right;1 set;10 reps     Shoulder Exercises: Standing   Extension Both;20 reps;Theraband   Theraband Level (Shoulder Extension) Level 2 (Red)   Row Both;20 reps;Theraband   Theraband Level (Shoulder Row) Level 2 (Red)     Moist Heat Therapy   Moist Heat Location Shoulder     Electrical Stimulation   Electrical Stimulation Location Posterior rt shoulder   Electrical Stimulation Goals Pain;Tone     Manual Therapy   Manual Therapy Soft tissue mobilization   Manual therapy comments TPR, STM to Rt upper trap, levator scapulae regions.                PT Education - 02/04/16 1111    Education provided Yes   Education Details HEP, reinforced need to work on [employer]) Educated Patient   Methods Explanation   Comprehension Verbalized understanding             PT Long Term Goals - 02/04/16 1116      PT LONG TERM GOAL #1   Title Improve muscular extensibility and ROM through Rt shoulder girdle and hip musculature  to palpation 02/12/16   Time 6   Period Weeks   Status On-going     PT LONG TERM GOAL #2   Title Increase shoulder ROM by 5-10 degrees bilat flexion Rt ER/abd 02/12/16   Period Weeks   Status On-going     PT LONG TERM GOAL #3   Title Decrease frequency; intensity and duration of pain in the Rt shd/hip 02/12/16   Time 6   Period Weeks   Status On-going     PT LONG TERM GOAL #4   Title Independent in HEP 02/12/16   Time 6   Period Weeks   Status On-going     PT LONG TERM GOAL #5   Title Improve FOTO to </= 42% limitation 02/12/16   Time 6   Period Weeks   Status On-going               Plan - 02/04/16 1112    Clinical Impression Statement Pr reports no pain in hip region currently but continuing pain in rt scapular region. Pt admits to not working on HEP which we discussed and pt will make effort to perform exercises. Good relief from treatment after last session but pain did return but improving. Will  attempt to focus on scapular stabilization exercises in clinid and via HEP.    Rehab Potential Good   PT Frequency 2x / week   PT Duration 6 weeks   PT Treatment/Interventions Patient/family education;ADLs/Self Care Home Management;Neuromuscular re-education;Cryotherapy;Electrical Stimulation;Iontophoresis 4mg /ml Dexamethasone;Moist Heat;Ultrasound;Dry needling;Manual techniques;Therapeutic activities;Therapeutic exercise   PT Next Visit Plan Focus on Rt scapular stabilization and progress HEP. Pt to work on consistency with HEP.  Review goals.   Consulted and Agree with Plan of Care Patient      Patient will benefit from skilled therapeutic intervention in order to improve the following deficits and impairments:  Postural dysfunction, Improper body mechanics, Pain, Decreased range of motion, Decreased mobility, Decreased strength, Decreased activity tolerance  Visit Diagnosis: Acute pain of right shoulder  Pain in right hip  Abnormal posture  Other symptoms and signs involving the musculoskeletal system     Problem List Patient Active Problem List   Diagnosis Date Noted  . Vaginal atrophy 01/31/2016  . Idiopathic scoliosis 01/22/2016  . Hip bursitis 12/25/2015  . Biceps tendonitis on right 12/25/2015  . Diverticulitis of colon 12/21/2015  . GERD (gastroesophageal reflux disease) 11/27/2015  . Osteoporosis 11/27/2015  . GAD (generalized anxiety disorder) 11/27/2015  . MDD (major depressive disorder), recurrent, in full remission ([place]) 11/27/2015  . Chronic dryness of both eyes 11/27/2015  . Hypothyroidism 11/27/2015  . Asthma, chronic 11/27/2015  . Insomnia 11/27/2015  . Seborrheic keratoses 11/27/2015    Linard Millers, PT, CSCS 02/04/2016, 11:21 AM  [facility] [place], [place], [address] Phone: [phone]   Fax:  [phone]  Name: Leslie Roberson MRN: [id number] Date of Birth:  [birth year]

## 2016-02-04 NOTE — Progress Notes (Signed)
Pt is here in clinic for shingles vaccine.  She tolerated injection well without complications. -[facility]/RMA

## 2016-02-07 ENCOUNTER — Ambulatory Visit (INDEPENDENT_AMBULATORY_CARE_PROVIDER_SITE_OTHER): Payer: BLUE CROSS/BLUE SHIELD | Admitting: Physical Therapy

## 2016-02-07 DIAGNOSIS — M25511 Pain in right shoulder: Secondary | ICD-10-CM | POA: Diagnosis not present

## 2016-02-07 DIAGNOSIS — R293 Abnormal posture: Secondary | ICD-10-CM | POA: Diagnosis not present

## 2016-02-07 DIAGNOSIS — R29898 Other symptoms and signs involving the musculoskeletal system: Secondary | ICD-10-CM | POA: Diagnosis not present

## 2016-02-07 DIAGNOSIS — M25551 Pain in right hip: Secondary | ICD-10-CM

## 2016-02-07 NOTE — Therapy (Signed)
[place], [place], [address] Phone: [phone]   Fax:  [phone]  Physical Therapy Treatment  Patient Details  Name: Leslie Roberson MRN: [id number] Date of Birth: 07/24/1955 Referring Provider: Dr. Lynne Roberson   Encounter Date: 02/07/2016      PT End of Session - 02/07/16 1450    Visit Number 10   Number of Visits 12   Date for PT Re-Evaluation 02/12/16   PT Start Time 3559   PT Stop Time 1540   PT Time Calculation (min) 53 min   Activity Tolerance Patient tolerated treatment well   Behavior During Therapy [facility] for tasks assessed/performed      Past Medical History:  Diagnosis Date  . Depression   . Idiopathic scoliosis 01/22/2016  . Thyroid disease     Past Surgical History:  Procedure Laterality Date  . ABDOMINAL HYSTERECTOMY    . bladder mesh    . ELBOW SURGERY Right   . SPINAL FUSION    . TONSILLECTOMY    . TOTAL HIP ARTHROPLASTY      There were no vitals filed for this visit.      Subjective Assessment - 02/07/16 1453    Subjective Pt reports she was moving a dresser yesterday and her Rt shoulder has been painful since then.    Currently in Pain? Yes   Pain Score 8    Pain Location Shoulder   Pain Orientation Right   Pain Descriptors / Indicators Sharp   Multiple Pain Sites Yes   Pain Score 2   Pain Location Hip   Pain Orientation Right   Pain Descriptors / Indicators Aching            [facility] PT Assessment - 02/07/16 0001      Assessment   Medical Diagnosis Rt hip bursitis; Rt arm/shoulder pain    Referring Provider Dr. Lynne Roberson    Onset Date/Surgical Date 05/02/15   Hand Dominance Right   Next MD Visit PRN     AROM   Right Shoulder Extension 54 Degrees   Right Shoulder Flexion 125 Degrees   Right Shoulder ABduction 123 Degrees   Right Shoulder External Rotation 60 Degrees   Left Shoulder Flexion 118 Degrees   Left Shoulder ABduction 106 Degrees    Left Shoulder External Rotation 95 Degrees         [facility] Adult PT Treatment/Exercise - 02/07/16 0001      Knee/Hip Exercises: Aerobic   Stationary Bike Nustep L5 x 5 min (arms/legs)     Knee/Hip Exercises: Standing   Hip Extension Right;Left;2 sets;10 reps  with red TB below knees    Other Standing Knee Exercises side stepping with red band around ankles x 10 ft Rt/Lt , 2 sets     Knee/Hip Exercises: Supine   Bridges Limitations Bridging 5 sec hold 1x 5 reps, then figure 4 each side x 10 reps     Other Supine Knee/Hip Exercises supine clam shells x 10 reps x 2 sets (with red band above knees)     Shoulder Exercises: Standing   Extension Both;20 reps;Theraband   Theraband Level (Shoulder Extension) Level 1 (Yellow)   Row Both;20 reps;Theraband   Theraband Level (Shoulder Row) Level 1 (Yellow)     Moist Heat Therapy   Number Minutes Moist Heat 15 Minutes   Moist Heat Location Shoulder     Electrical Stimulation   Electrical Stimulation Location surrounding Rt shoulder girdle   Electrical  Stimulation Action IFC   Electrical Stimulation Parameters to tolerance    Electrical Stimulation Goals Pain;Tone     Manual Therapy   Manual Therapy Soft tissue mobilization   Manual therapy comments TPR, STM to Rt upper trap, rhomboid, levator scapulae regions.                     PT Long Term Goals - 02/07/16 1513      PT LONG TERM GOAL #1   Title Improve muscular extensibility and ROM through Rt shoulder girdle and hip musculature to palpation 02/12/16   Time 6   Period Weeks   Status On-going     PT LONG TERM GOAL #2   Title Increase shoulder ROM by 5-10 degrees bilat flexion Rt ER/abd 02/12/16   Time 6   Period Weeks   Status On-going     PT LONG TERM GOAL #3   Title Decrease frequency; intensity and duration of pain in the Rt shd/hip 02/12/16   Time 6   Period Weeks   Status Partially Met  hip has decreased     PT LONG TERM GOAL #4   Title Independent  in HEP 02/12/16   Time 6   Period Weeks   Status On-going     PT LONG TERM GOAL #5   Title Improve FOTO to </= 42% limitation 02/12/16   Time 6   Period Weeks   Status On-going               Plan - 02/07/16 1739    Clinical Impression Statement Pt tolerated exercises well, with minimal to no increase in pain. Her Rt shoulder ROM continues to be limited. Pt making gradual progress towards established goals.  She requests to decrease freq of visits.     Rehab Potential Good   PT Frequency 2x / week   PT Duration 6 weeks   PT Treatment/Interventions Patient/family education;ADLs/Self Care Home Management;Neuromuscular re-education;Cryotherapy;Electrical Stimulation;Iontophoresis 13m/ml Dexamethasone;Moist Heat;Ultrasound;Dry needling;Manual techniques;Therapeutic activities;Therapeutic exercise   PT Next Visit Plan Assess goals and write note for additional visits to be approved (by insurance) if needed.    Consulted and Agree with Plan of Care Patient      Patient will benefit from skilled therapeutic intervention in order to improve the following deficits and impairments:  Postural dysfunction, Improper body mechanics, Pain, Decreased range of motion, Decreased mobility, Decreased strength, Decreased activity tolerance  Visit Diagnosis: Acute pain of right shoulder  Pain in right hip  Abnormal posture  Other symptoms and signs involving the musculoskeletal system     Problem List Patient Active Problem List   Diagnosis Date Noted  . Rosacea 02/04/2016  . Vaginal atrophy 01/31/2016  . Idiopathic scoliosis 01/22/2016  . Hip bursitis 12/25/2015  . Biceps tendonitis on right 12/25/2015  . Diverticulitis of colon 12/21/2015  . GERD (gastroesophageal reflux disease) 11/27/2015  . Osteoporosis 11/27/2015  . GAD (generalized anxiety disorder) 11/27/2015  . MDD (major depressive disorder), recurrent, in full remission ([place] 11/27/2015  . Chronic dryness of both eyes  11/27/2015  . Hypothyroidism 11/27/2015  . Asthma, chronic 11/27/2015  . Insomnia 11/27/2015  . Seborrheic keratoses 11/27/2015   Leslie Roberson Leslie Roberson 02/07/16 5:49 PM  [place] [address]Phone: [phone]  Fax:  [phone] Name: Leslie MadewellMRN: [id number] of Birth: [birth year]

## 2016-02-11 ENCOUNTER — Other Ambulatory Visit: Payer: Self-pay | Admitting: Physician Assistant

## 2016-02-11 ENCOUNTER — Encounter: Payer: Self-pay | Admitting: Physician Assistant

## 2016-02-11 ENCOUNTER — Encounter: Payer: BLUE CROSS/BLUE SHIELD | Admitting: Physical Therapy

## 2016-02-17 ENCOUNTER — Other Ambulatory Visit: Payer: Self-pay | Admitting: Osteopathic Medicine

## 2016-02-18 ENCOUNTER — Other Ambulatory Visit: Payer: Self-pay | Admitting: Physician Assistant

## 2016-02-19 ENCOUNTER — Encounter: Payer: Self-pay | Admitting: Family Medicine

## 2016-02-19 ENCOUNTER — Encounter: Payer: Self-pay | Admitting: Physical Therapy

## 2016-02-19 ENCOUNTER — Ambulatory Visit (INDEPENDENT_AMBULATORY_CARE_PROVIDER_SITE_OTHER): Payer: BLUE CROSS/BLUE SHIELD | Admitting: Family Medicine

## 2016-02-19 ENCOUNTER — Ambulatory Visit (INDEPENDENT_AMBULATORY_CARE_PROVIDER_SITE_OTHER): Payer: BLUE CROSS/BLUE SHIELD

## 2016-02-19 ENCOUNTER — Ambulatory Visit (INDEPENDENT_AMBULATORY_CARE_PROVIDER_SITE_OTHER): Payer: BLUE CROSS/BLUE SHIELD | Admitting: Physical Therapy

## 2016-02-19 DIAGNOSIS — R293 Abnormal posture: Secondary | ICD-10-CM | POA: Diagnosis not present

## 2016-02-19 DIAGNOSIS — M25511 Pain in right shoulder: Secondary | ICD-10-CM

## 2016-02-19 DIAGNOSIS — M25561 Pain in right knee: Secondary | ICD-10-CM | POA: Diagnosis not present

## 2016-02-19 DIAGNOSIS — R29898 Other symptoms and signs involving the musculoskeletal system: Secondary | ICD-10-CM

## 2016-02-19 IMAGING — DX DG KNEE COMPLETE 4+V*R*
4 series · 4 of 4 positions shown · non-contrast
Comparison: None.

CLINICAL DATA: Right knee pain for 2 months. Feels like knee cap is
moving around. No injury.

EXAM:
RIGHT KNEE - COMPLETE 4+ VIEW

[knee lat]
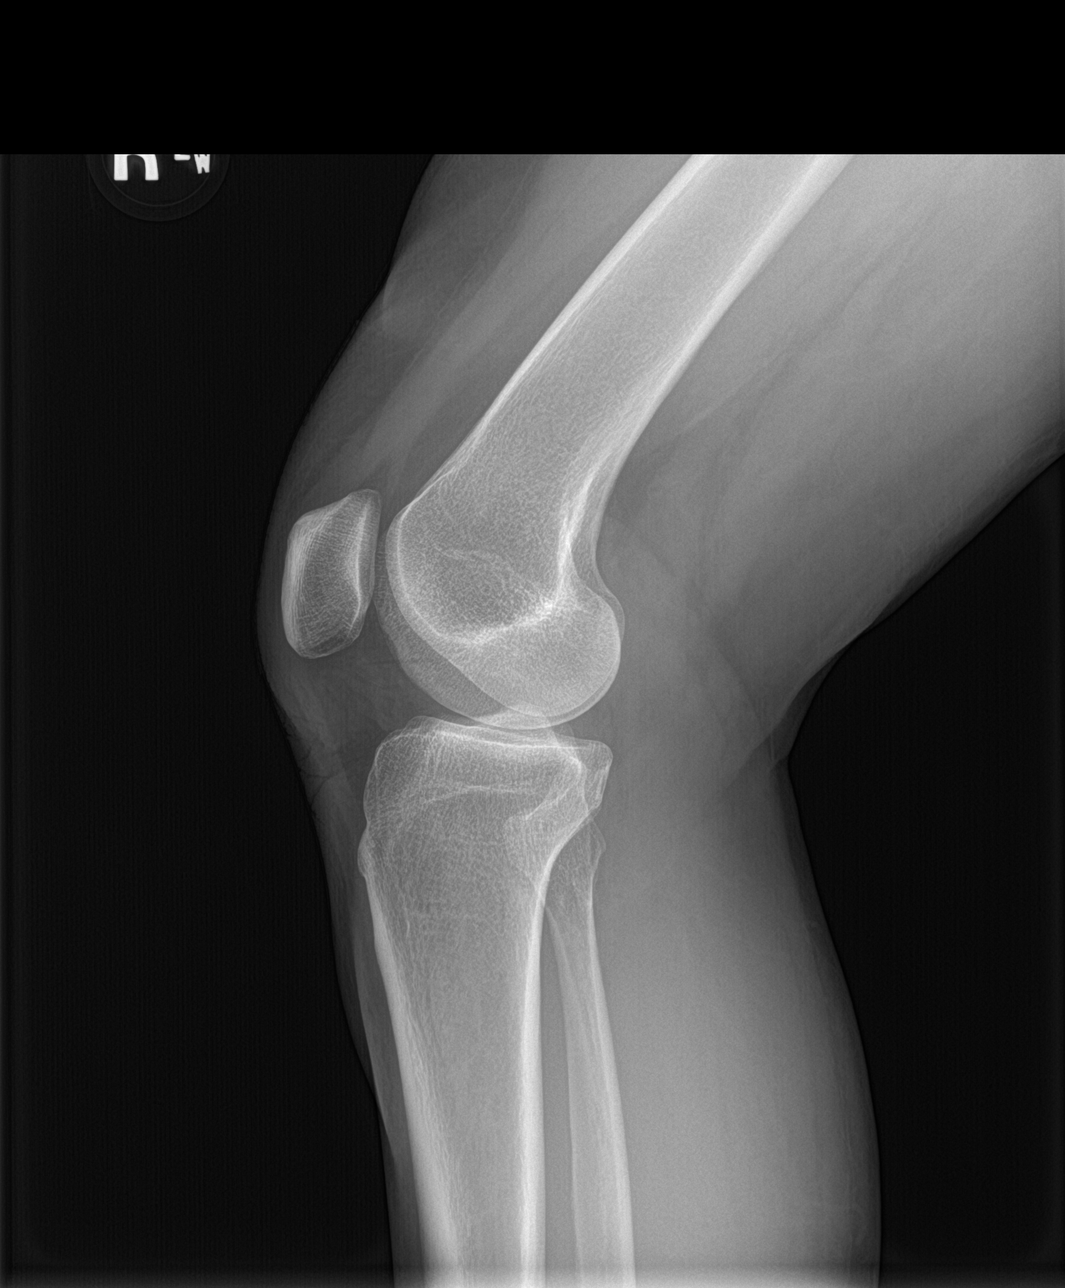

[knee sunrise]
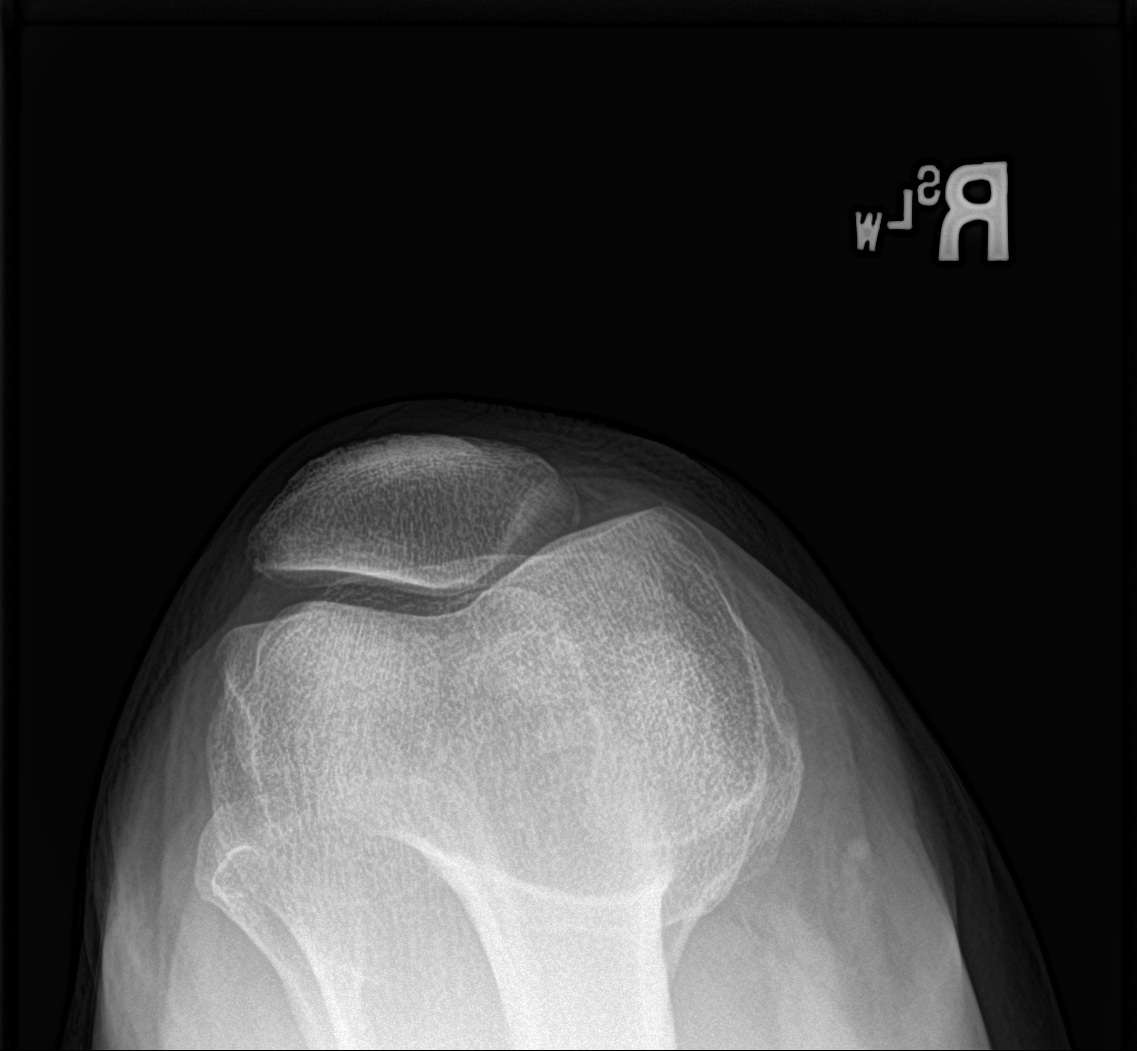

[knee ap bilat standing (1 of 2)]
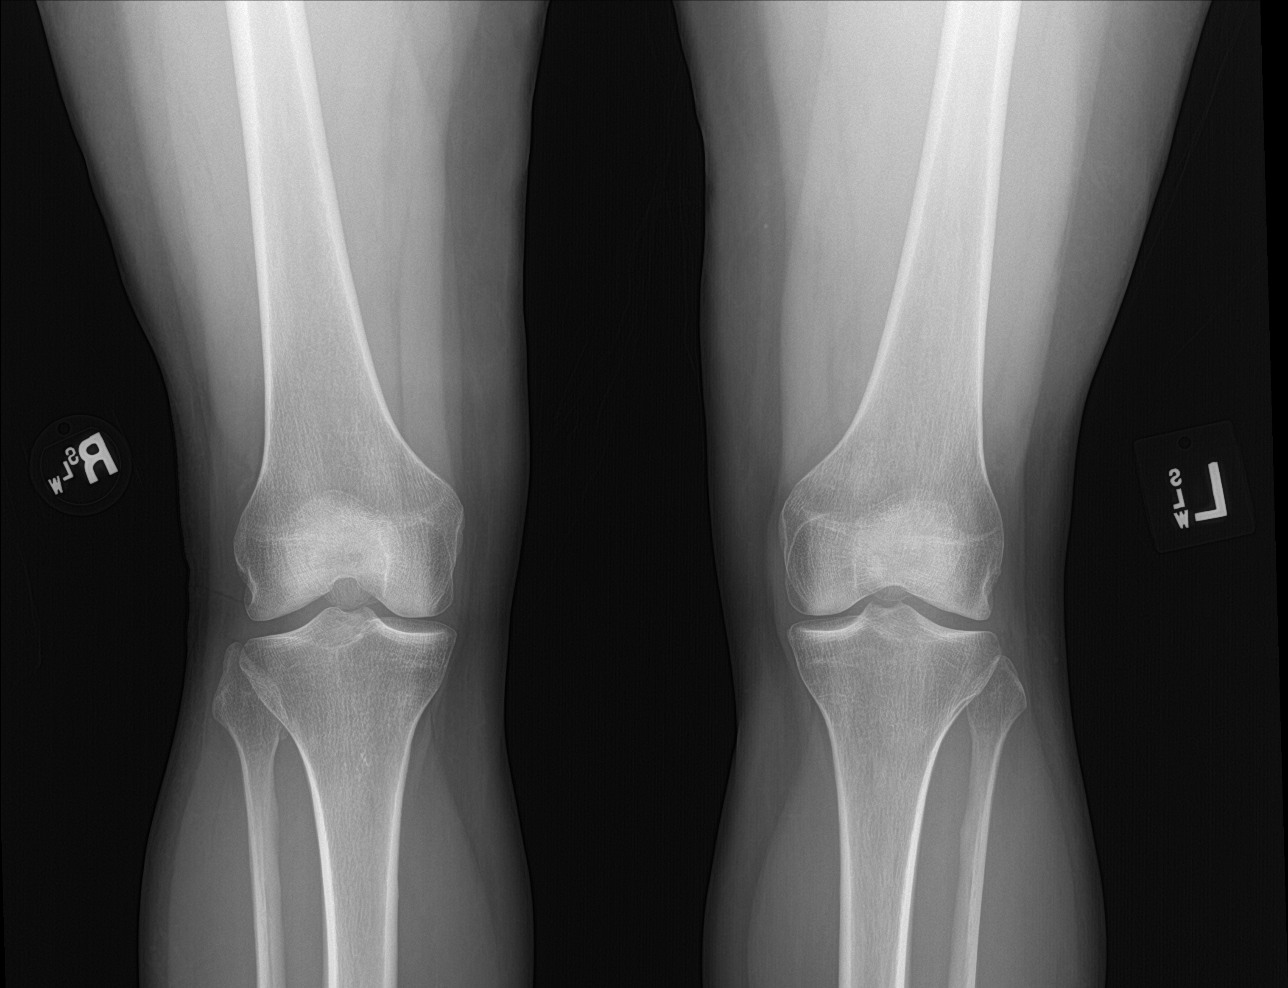

[knee ap bilat standing (2 of 2)]
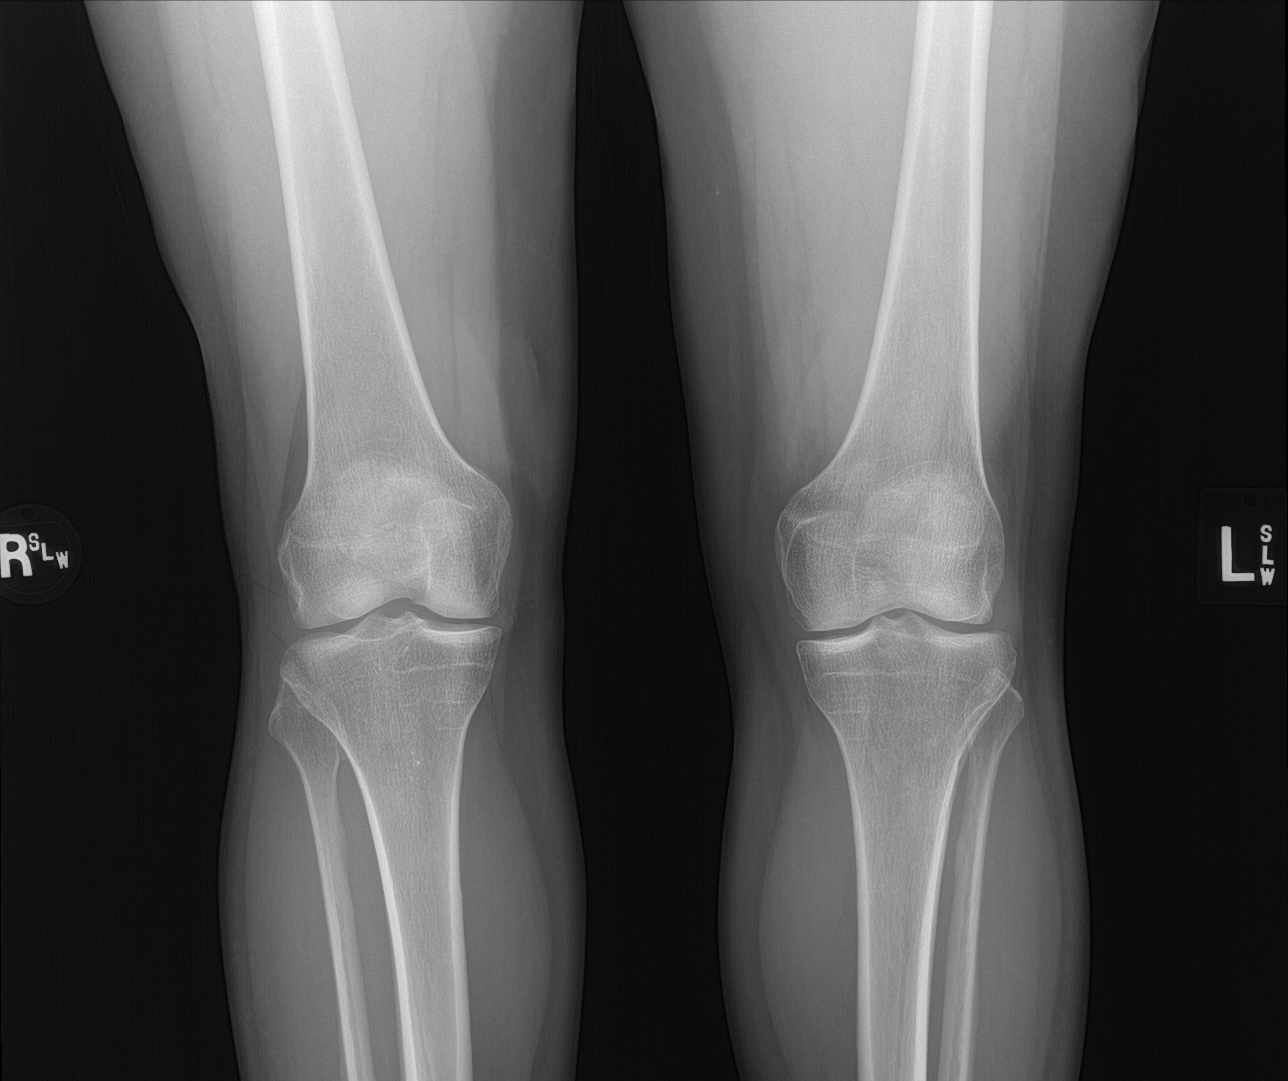

[4 of 4 positions shown; findings below may reference images not displayed]

FINDINGS: No evidence of fracture, dislocation, or joint effusion. No evidence
of arthropathy or other focal bone abnormality. Soft tissues are
unremarkable.
IMPRESSION: Negative.

## 2016-02-19 NOTE — Patient Instructions (Signed)
Thank you for coming in today. Continue PT.  Return for recheck in 1 year .  Return sooner if needed.   We will work on [employer] prior [employer]

## 2016-02-19 NOTE — Patient Instructions (Addendum)
Strengthening: Hip Abductor - Resisted    With band looped around both legs above knees, push thighs apart. Progress to single leg strengthening.  Repeat __10__ times per set. Do _3__ sets per session. Do __1__ sessions per day.  Strengthening: Resisted External Rotation    Hold tubing in right hand, towel under elbow at side and forearm across body. Rotate forearm out. Repeat __10__ times per set. Do _3___ sets per session. Do __1__ sessions per day. Copyright  V[employer]. All rights reserved.

## 2016-02-19 NOTE — Progress Notes (Signed)
Leslie Roberson is a 60 y.o. female who presents to [place] today for right knee pain. Patient has been seen several times for right lateral hip pain and right shoulder pain due to trochanteric bursitis and tendinitis. She's received physical therapy and had significant improvement.  Over the last week or so she's had worsening right knee pain. She was seen in physical therapy today who noted lateral translation of the patella and applied KT tape which hasn't helped yet. She notes anterior knee pain worse with activity and better with rest. She notes pain with kneeling as well. She notes a grinding sensation underneath her kneecap which is been ongoing for some time now. She has not tried any over-the-counter medications yet. No fevers or chills vomiting diarrhea or injury.   Past Medical History:  Diagnosis Date  . Depression   . Idiopathic scoliosis 01/22/2016  . Thyroid disease    Past Surgical History:  Procedure Laterality Date  . ABDOMINAL HYSTERECTOMY    . bladder mesh    . ELBOW SURGERY Right   . SPINAL FUSION    . TONSILLECTOMY    . TOTAL HIP ARTHROPLASTY     Social History  Substance Use Topics  . Smoking status: Never Smoker  . Smokeless tobacco: Never Used  . Alcohol use Yes     ROS:  As above   Medications: Current Outpatient Prescriptions  Medication Sig Dispense Refill  . acetaminophen-codeine (TYLENOL #3) 300-30 MG tablet Take by mouth as needed for moderate pain.    Marland Kitchen albuterol (PROVENTIL HFA;VENTOLIN HFA) 108 (90 Base) MCG/ACT inhaler Inhale into the lungs as needed for wheezing or shortness of breath.    . busPIRone (BUSPAR) 30 MG tablet TAKE 1 TABLET (30 MG TOTAL) BY MOUTH 2 (TWO) TIMES DAILY. 60 tablet 0  . Carisoprodol (SOMA PO) Take by mouth 4 (four) times daily as needed.    . cycloSPORINE (RESTASIS) 0.05 % ophthalmic emulsion Place 1 drop into both eyes 2 (two) times daily.    Marland Kitchen denosumab (PROLIA)  60 MG/ML SOLN injection Inject 60 mg into the skin every 6 (six) months. Administer in upper arm, thigh, or abdomen    . desvenlafaxine (PRISTIQ) 100 MG 24 hr tablet Take 100 mg by mouth daily.    Marland Kitchen desvenlafaxine (PRISTIQ) 50 MG 24 hr tablet TAKE 1 TABLET BY MOUTH DAILY 30 tablet 4  . DEXILANT 60 MG capsule TAKE 1 CAPSULE (60 MG) BY MOUTH DAILY 30 capsule 3  . diclofenac sodium (VOLTAREN) 1 % GEL Apply 4 g topically 4 (four) times daily. 2 Tube 2  . estradiol (ESTRACE VAGINAL) 0.1 MG/GM vaginal cream Place 1 Applicatorful vaginally at bedtime. 42.5 g 12  . flunisolide (NASAREL) 29 MCG/ACT (0.025%) nasal spray Place 2 sprays into the nose daily as needed for rhinitis. Dose is for each nostril.    . fluticasone furoate-vilanterol (BREO ELLIPTA) 100-25 MCG/INH AEPB Inhale 1 puff into the lungs daily.    . hydrocortisone 2.5 % ointment Apply to affected area BID. 30 g 1  . lamoTRIgine (LAMICTAL) 200 MG tablet TAKE ONE TABLET BY MOUTH AT BEDTIME 90 tablet 0  . levothyroxine (SYNTHROID, LEVOTHROID) 125 MCG tablet Take 62.5 mcg by mouth daily before breakfast.    . metroNIDAZOLE (METROGEL) 1 % gel Apply topically daily. 45 g 0  . naproxen sodium (ANAPROX) 220 MG tablet Take 220 mg by mouth as needed.    . traZODone (DESYREL) 50 MG tablet Take 1  tablet (50 mg total) by mouth at bedtime. 90 tablet 1  . clonazePAM (KLONOPIN) 2 MG tablet TAKE 1 AND 1/2 TABLET BY MOUTH DAILY 45 tablet 0   No current facility-administered medications for this visit.    Allergies  Allergen Reactions  . Adhesive [Tape]   . Demerol [Meperidine]   . Dilaudid [Hydromorphone]   . Levofloxacin      Exam:  BP 132/68   Pulse 80   Wt 121 lb (54.9 kg)   BMI 25.29 kg/m  General: Well Developed, well nourished, and in no acute distress.  Neuro/Psych: Alert and oriented x3, extra-ocular muscles intact, able to move all 4 extremities, sensation grossly intact. Skin: Warm and dry, no rashes noted.  Respiratory: Not using  accessory muscles, speaking in full sentences, trachea midline.  Cardiovascular: Pulses palpable, no extremity edema. Abdomen: Does not appear distended. MSK: Right knee normal-appearing. Range of motion 0-120 of which patellar crepitations. Tender to palpation lateral patella area. Stable ligamentous exam. Intact flexion and extension strength.  Procedure: Real-time Ultrasound Guided Injection of right knee  Device: GE Logiq E  Images permanently stored and available for review in the ultrasound unit. Verbal informed consent obtained. Discussed risks and benefits of procedure. Warned about infection bleeding damage to structures skin hypopigmentation and fat atrophy among others. Patient expresses understanding and agreement Time-out conducted.  Noted no overlying erythema, induration, or other signs of local infection.  Skin prepped in a sterile fashion.  Local anesthesia: Topical Ethyl chloride.  With sterile technique and under real time ultrasound guidance: 80 mg kenalog and 58ml marcaine injected easily.  Completed without difficulty  Pain immediately resolved suggesting accurate placement of the medication.  Advised to call if fevers/chills, erythema, induration, drainage, or persistent bleeding.  Images permanently stored and available for review in the ultrasound unit.  Impression: Technically successful ultrasound guided injection.  X-ray right knee pending    No results found for this or any previous visit (from the past 48 hour(s)). No results found.    Assessment and Plan: 60 y.o. 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 Injection today. Treatment with diclofenac gel home exercise program and physical therapy. Recheck in about a month. Return sooner if needed.    Orders Placed This Encounter  Procedures  . DG Knee 1-2 Views Left    Standing Status:   Future    Number of Occurrences:   1    Standing Expiration Date:    04/21/2017    Order Specific Question:   Reason for Exam (SYMPTOM  OR DIAGNOSIS REQUIRED)    Answer:   For use with right knee x-ray, bilateral AP and Rosenberg standing.    Order Specific Question:   Is the patient pregnant?    Answer:   No    Order Specific Question:   Preferred imaging location?    Answer:   [facility]  . DG Knee Complete 4 Views Right    Please include patellar sunrise, lateral, and weightbearing bilateral AP and bilateral rosenberg views    Standing Status:   Future    Number of Occurrences:   1    Standing Expiration Date:   04/20/2017    Order Specific Question:   Reason for exam:    Answer:   Please include patellar sunrise, lateral, and weightbearing bilateral AP and bilateral rosenberg views    Comments:   Please include patellar sunrise, lateral, and weightbearing bilateral AP and bilateral rosenberg views    Order Specific  Question:   Preferred imaging location?    Answer:   [facility]    Discussed warning signs or symptoms. Please see discharge instructions. Patient expresses understanding.

## 2016-02-19 NOTE — Telephone Encounter (Signed)
Pt came by to ask for a refill on her clonazepam. She is completely out. Please advise. Thanks!

## 2016-02-19 NOTE — Progress Notes (Signed)
Submitted for approval on [employer]. Awaiting confirmation.  

## 2016-02-19 NOTE — Therapy (Signed)
[place], [place], [address] Phone: [phone]   Fax:  [phone]  Physical Therapy Treatment  Patient Details  Name: Leslie Roberson MRN: [id number] Date of Birth: [birth year] Referring Provider: Dr Steva Colder  Encounter Date: 02/19/2016      PT End of Session - 02/19/16 1018    Visit Number 11   Number of Visits 17   Date for PT Re-Evaluation 04/01/16   PT Start Time 1019   PT Stop Time 1117   PT Time Calculation (min) 58 min   Activity Tolerance Patient tolerated treatment well      Past Medical History:  Diagnosis Date  . Depression   . Idiopathic scoliosis 01/22/2016  . Thyroid disease     Past Surgical History:  Procedure Laterality Date  . ABDOMINAL HYSTERECTOMY    . bladder mesh    . ELBOW SURGERY Right   . SPINAL FUSION    . TONSILLECTOMY    . TOTAL HIP ARTHROPLASTY      There were no vitals filed for this visit.      Subjective Assessment - 02/19/16 1020    Subjective Pt had a long drive to [place] this past week.  Currently she feels like her shoulder and hip are feeling better however Rt knee is very painful.  Feels like it is under her knee cap.  Sees the MD this after noon about the knee. and continue with PT for the shoulder.    Currently in Pain? Yes   Pain Score --  varies from 0-9/10 with position/activity   Pain Location Knee   Pain Orientation Right   Pain Descriptors / Indicators Sharp   Pain Type Chronic pain   Pain Onset More than a month ago   Pain Frequency Intermittent   Aggravating Factors  walking or twisting of the knee    Pain Relieving Factors sitting   Multiple Pain Sites --  shoulder - tight, doesn't feel pain - the knot is gone, no hip pain.             [facility] PT Assessment - 02/19/16 0001      Assessment   Medical Diagnosis Rt hip bursitis; Rt arm/shoulder pain    Referring Provider Dr Steva Colder   Onset Date/Surgical Date 05/02/15    Hand Dominance Right   Next MD Visit PRN     Observation/Other Assessments   Focus on Therapeutic Outcomes (FOTO)  67% limited      ROM / Strength   AROM / PROM / Strength AROM;Strength     AROM   Right Shoulder Flexion 138 Degrees   Right Shoulder ABduction 118 Degrees   Right Shoulder Internal Rotation --  behind back to ~ L3   Right Shoulder External Rotation --  behind head fine     Strength   Strength Assessment Site Shoulder;Hip;Knee   Right/Left Shoulder --  bilat WNL, except Rt ER 4/5   Right/Left Hip Right;Left   Right Hip Flexion 4+/5  with some knee pain   Right Hip Extension 5/5   Right Hip ABduction 4-/5   Right/Left Knee --  Lt WNL, Rt grossly 4+/5 with pain     Flexibility   Hamstrings supine SLR Rt 95, Lt 110   Quadriceps prone knee flex Rt 112, Lt  142     Palpation   Patella mobility lateral tracking - Rt  [facility] Adult PT Treatment/Exercise - 02/19/16 0001      Knee/Hip Exercises: Standing   Other Standing Knee Exercises 15 reps sit to/from stand  Rt LE only.      Knee/Hip Exercises: Supine   Other Supine Knee/Hip Exercises clams with red band 3x10, Rt only x10 more.     Shoulder Exercises: Standing   External Rotation Strengthening;Right;Theraband  2x10   Theraband Level (Shoulder External Rotation) Level 1 (Yellow)     Modalities   Modalities Electrical Stimulation;Moist Heat     Moist Heat Therapy   Number Minutes Moist Heat 15 Minutes   Moist Heat Location Knee  Rt      Electrical Stimulation   Electrical Stimulation Location Rt shoulder/deltiod & Rt knee   Electrical Stimulation Action premod   Electrical Stimulation Parameters to tolerance   Electrical Stimulation Goals Pain     Manual Therapy   Kinesiotex --  Rt knee dynamic tape for lateral tracking.                      PT Long Term Goals - 02/19/16 1324      PT LONG TERM GOAL #1   Title Improve muscular extensibility  and ROM through Rt shoulder girdle and hip musculature to palpation (04/01/16)   Time 6   Period Weeks   Status On-going     PT LONG TERM GOAL #2   Title Increase shoulder ROM by 5-10 degrees bilat flexion Rt ER/abd 04/01/16   Time 6   Period Weeks   Status Partially Met     PT LONG TERM GOAL #3   Title Decrease frequency; intensity and duration of pain in the Rt shd/hip 02/12/16   Status Achieved     PT LONG TERM GOAL #4   Title Independent in HEP 04/01/16   Time 6   Period Weeks   Status On-going     PT LONG TERM GOAL #5   Title Improve FOTO to </= 42% limitation 04/01/16   Time 6   Period Weeks   Status On-going  scored 67% limited               Plan - 02/19/16 1036    Clinical Impression Statement Pt is seen today for a renewal assessment of her shoulder/hip and knee, all Rt side.  Her shoulder is doing a lot better, still has some stiffness and pain.  The hip has settled down a lot also however her Rt knee is now bothering her the most.  She owuld benefit from continued treatment to address her stiffness, pain and weakness. Frequency will be decreased to 1x/wk.    Rehab Potential Good   PT Frequency 1x / week   PT Duration 6 weeks   PT Treatment/Interventions Patient/family education;ADLs/Self Care Home Management;Neuromuscular re-education;Cryotherapy;Electrical Stimulation;Iontophoresis 10m/ml Dexamethasone;Moist Heat;Ultrasound;Dry needling;Manual techniques;Therapeutic activities;Therapeutic exercise   PT Next Visit Plan continue tape Rt knee, progress strengthening Rt RTC and eccentric Rt quad, glut medius.    Consulted and Agree with Plan of Care Patient      Patient will benefit from skilled therapeutic intervention in order to improve the following deficits and impairments:  Postural dysfunction, Improper body mechanics, Pain, Decreased range of motion, Decreased mobility, Decreased strength, Decreased activity tolerance  Visit Diagnosis: Acute pain of right  shoulder - Plan: PT plan of care cert/re-cert  Abnormal posture - Plan: PT plan of care cert/re-cert  Other symptoms and signs involving the musculoskeletal system - Plan: PT plan  of care cert/re-cert     Problem List Patient Active Problem List   Diagnosis Date Noted  . Rosacea 02/04/2016  . Vaginal atrophy 01/31/2016  . Idiopathic scoliosis 01/22/2016  . Hip bursitis 12/25/2015  . Biceps tendonitis on right 12/25/2015  . Diverticulitis of colon 12/21/2015  . GERD (gastroesophageal reflux disease) 11/27/2015  . Osteoporosis 11/27/2015  . GAD (generalized anxiety disorder) 11/27/2015  . MDD (major depressive disorder), recurrent, in full remission ([place]) 11/27/2015  . Chronic dryness of both eyes 11/27/2015  . Hypothyroidism 11/27/2015  . Asthma, chronic 11/27/2015  . Insomnia 11/27/2015  . Seborrheic keratoses 11/27/2015    Jeral Pinch PT  02/19/2016, 1:28 PM  [facility] [place] [address], [place], [address] Phone: [phone]   Fax:  [phone]  Name: Leslie Roberson MRN: [id number] Date of Birth: [birth year]

## 2016-02-20 ENCOUNTER — Telehealth: Payer: Self-pay | Admitting: Family Medicine

## 2016-02-20 NOTE — Telephone Encounter (Signed)
Called for pre-determination [phone]), spoke with Alma Friendly. No auth required. Provider may buy and bill.   Left VM advising Pt we are ready to schedule.

## 2016-02-20 NOTE — Telephone Encounter (Signed)
Received the following information from [employer] benefits investigation:  Patient has Fully Funded PPO plan with an effective date of 05/02/2015. Plan renews on 03/31/16. Benefits for dates of service after 03/31/16 may vary. We recommend you resubmit for dates of service after 03/31/16. As of 12/29/2013 HA injections are subject to review under drug policy 29. Medical policy criteria can be accessed on [employer], or a Pre-Determination is required and can be obtained by calling [phone]. [id number] is covered at 100% & [id number] is covered at 100% of the contracted rate when performed in an office setting. *Deductible does not apply. Out of pocket has been met. Reference: [id number]   Left VM for Pt to return clinic call. If she intends to complete series before the end of the year we will complete pre-determination. If not, we will resubmit in January for updated benefits quote.

## 2016-02-20 NOTE — Telephone Encounter (Signed)
Pt returned call, advised of all information. Transferred to scheduling for appts to be made.

## 2016-02-27 ENCOUNTER — Ambulatory Visit (INDEPENDENT_AMBULATORY_CARE_PROVIDER_SITE_OTHER): Payer: BLUE CROSS/BLUE SHIELD | Admitting: Family Medicine

## 2016-02-27 ENCOUNTER — Ambulatory Visit (INDEPENDENT_AMBULATORY_CARE_PROVIDER_SITE_OTHER): Payer: BLUE CROSS/BLUE SHIELD | Admitting: Rehabilitative and Restorative Service Providers"

## 2016-02-27 ENCOUNTER — Encounter: Payer: Self-pay | Admitting: Rehabilitative and Restorative Service Providers"

## 2016-02-27 ENCOUNTER — Encounter: Payer: Self-pay | Admitting: Family Medicine

## 2016-02-27 VITALS — BP 147/73 | HR 93

## 2016-02-27 DIAGNOSIS — R293 Abnormal posture: Secondary | ICD-10-CM | POA: Diagnosis not present

## 2016-02-27 DIAGNOSIS — R29898 Other symptoms and signs involving the musculoskeletal system: Secondary | ICD-10-CM | POA: Diagnosis not present

## 2016-02-27 DIAGNOSIS — M25511 Pain in right shoulder: Secondary | ICD-10-CM | POA: Diagnosis not present

## 2016-02-27 DIAGNOSIS — M25561 Pain in right knee: Secondary | ICD-10-CM

## 2016-02-27 DIAGNOSIS — M25551 Pain in right hip: Secondary | ICD-10-CM

## 2016-02-27 NOTE — Therapy (Signed)
[facility] [place], [place], [address] Phone: [phone]   Fax:  [phone]  Physical Therapy Treatment  Patient Details  Name: Leslie Roberson MRN: [id number] Date of Birth: May 26, 1955 Referring Provider: Dr Steva Colder  Encounter Date: 02/27/2016      PT End of Session - 02/27/16 1111    Visit Number 12   Number of Visits 17   Date for PT Re-Evaluation 04/01/16   PT Start Time 1108   PT Stop Time 1203   PT Time Calculation (min) 55 min   Activity Tolerance Patient tolerated treatment well      Past Medical History:  Diagnosis Date  . Depression   . Idiopathic scoliosis 01/22/2016  . Thyroid disease     Past Surgical History:  Procedure Laterality Date  . ABDOMINAL HYSTERECTOMY    . bladder mesh    . ELBOW SURGERY Right   . SPINAL FUSION    . TONSILLECTOMY    . TOTAL HIP ARTHROPLASTY      There were no vitals filed for this visit.      Subjective Assessment - 02/27/16 1112    Subjective Patient reports that she received an injection in the Rt knee. MD states that injections may take a while to take effect. She was travelling to a wedding over the Thanksgiving holiday for a wedding.    Currently in Pain? Yes   Pain Score 7    Pain Location Knee   Pain Orientation Right   Pain Descriptors / Indicators Aching   Pain Type Chronic pain   Pain Onset More than a month ago   Pain Frequency Intermittent                         [facility] Adult PT Treatment/Exercise - 02/27/16 0001      Knee/Hip Exercises: Aerobic   Stationary Bike Nustep L4 x 7 min (arms/legs)     Knee/Hip Exercises: Standing   Hip Abduction Right;Left;2 sets;10 reps  green TB above knee    Hip Extension Right;Left;1 set;10 reps   Extension Limitations x10 no resistance; x15 with green TB around distal thighs bilat      Knee/Hip Exercises: Supine   Bridges with Clamshell Strengthening;10 reps;3 sets   with red TB distal thighs    Other Supine Knee/Hip Exercises clams with red band 3x10, Rt only x10 more.     Shoulder Exercises: Standing   External Rotation Strengthening;Right;Theraband   Theraband Level (Shoulder External Rotation) Level 2 (Red)   Extension Both;20 reps;Theraband   Theraband Level (Shoulder Extension) Level 2 (Red)   Row Both;20 reps;Theraband   Theraband Level (Shoulder Row) Level 2 (Red)   Retraction Both;20 reps;Theraband   Theraband Level (Shoulder Retraction) Level 1 (Yellow)     Shoulder Exercises: Stretch   Other Shoulder Stretches doorway stretch low and mid levels 30 sec hold x 3 reps;      Moist Heat Therapy   Number Minutes Moist Heat 15 Minutes   Moist Heat Location Shoulder;Knee     Electrical Stimulation   Electrical Stimulation Location Rt shoulder/deltiod    Electrical Stimulation Action IFC   Electrical Stimulation Parameters to tolerance   Electrical Stimulation Goals Pain;Tone     Manual Therapy   Manual Therapy Soft tissue mobilization   Manual therapy comments TPR, STM to Rt upper trap, rhomboid, levator scapulae regions.   Soft tissue mobilization Rt posterior shoulder girdle - upper  trap; periscapular area; teres; lats   Myofascial Release Rt posterior shoudler girdle   Kinesiotex --  taping for lateral tracking          Trigger Point Dry Needling - 02/27/16 1155    Consent Given? Yes   Upper Trapezius Response Palpable increased muscle length   Levator Scapulae Response Palpable increased muscle length   Rhomboids Response Palpable increased muscle length   Supraspinatus Response Palpable increased muscle length   Subscapularis Response Palpable increased muscle length              PT Education - 02/27/16 1156    Education provided Yes   Education Details HEP   Person(s) Educated Patient   Methods Explanation;Demonstration;Tactile cues;Verbal cues   Comprehension Verbalized understanding;Returned  demonstration;Verbal cues required;Tactile cues required             PT Long Term Goals - 02/27/16 1116      PT LONG TERM GOAL #1   Title Improve muscular extensibility and ROM through Rt shoulder girdle and hip musculature to palpation (04/01/16)   Time 6   Period Weeks   Status On-going     PT LONG TERM GOAL #2   Title Increase shoulder ROM by 5-10 degrees bilat flexion Rt ER/abd 04/01/16   Time 6   Period Weeks   Status Partially Met     PT LONG TERM GOAL #3   Title Decrease frequency; intensity and duration of pain in the Rt shd/hip 02/12/16   Time 6   Period Weeks   Status Achieved     PT LONG TERM GOAL #4   Title Independent in HEP 04/01/16   Time 6   Period Weeks   Status On-going     PT LONG TERM GOAL #5   Title Improve FOTO to </= 42% limitation 04/01/16   Time 6   Period Weeks   Status On-going               Plan - 02/27/16 1116    Clinical Impression Statement Continued multiple musculoskeletal problems. Gradual progress with rehab. Patient is working on exercises at home - less while away for the holidays. Continuing to work toward goals of rehab.    Rehab Potential Good   PT Frequency 1x / week   PT Duration 6 weeks   PT Treatment/Interventions Patient/family education;ADLs/Self Care Home Management;Neuromuscular re-education;Cryotherapy;Electrical Stimulation;Iontophoresis 56m/ml Dexamethasone;Moist Heat;Ultrasound;Dry needling;Manual techniques;Therapeutic activities;Therapeutic exercise   PT Next Visit Plan continue tape Rt knee, progress strengthening Rt RTC and eccentric Rt quad, glut medius.    Consulted and Agree with Plan of Care Patient      Patient will benefit from skilled therapeutic intervention in order to improve the following deficits and impairments:  Postural dysfunction, Improper body mechanics, Pain, Decreased range of motion, Decreased mobility, Decreased strength, Decreased activity tolerance  Visit Diagnosis: Acute pain of  right shoulder  Abnormal posture  Other symptoms and signs involving the musculoskeletal system  Pain in right hip     Problem List Patient Active Problem List   Diagnosis Date Noted  . Right knee pain 02/19/2016  . Rosacea 02/04/2016  . Vaginal atrophy 01/31/2016  . Idiopathic scoliosis 01/22/2016  . Hip bursitis 12/25/2015  . Biceps tendonitis on right 12/25/2015  . Diverticulitis of colon 12/21/2015  . GERD (gastroesophageal reflux disease) 11/27/2015  . Osteoporosis 11/27/2015  . GAD (generalized anxiety disorder) 11/27/2015  . MDD (major depressive disorder), recurrent[place] full [place] 11/27/2015  . Chronic dryness  of both eyes 11/27/2015  . Hypothyroidism 11/27/2015  . Asthma, chronic 11/27/2015  . Insomnia 11/27/2015  . Seborrheic keratoses 11/27/2015    Leslie Roberson PT, MPH  02/27/2016, 11:58 AM  [facility] [place], [place], [address] Phone: [phone]   Fax:  [phone]  Name: Leslie Roberson MRN: [id number] Date of Birth: [birth year]

## 2016-02-27 NOTE — Patient Instructions (Signed)
Thank you for coming in today.  Call or go to the ER if you develop a large red swollen joint with extreme pain or oozing puss.   Return in 1 week.  

## 2016-02-27 NOTE — Progress Notes (Signed)
Patient presents to clinic today for previously arranged right knee injection Orthovisc 1/4  Procedure: Real-time Ultrasound Guided Injection of right knee  Device: GE Logiq E  Images permanently stored and available for review in the ultrasound unit. Verbal informed consent obtained. Discussed risks and benefits of procedure. Warned about infection bleeding damage to structures skin hypopigmentation and fat atrophy among others. Patient expresses understanding and agreement Time-out conducted.  Noted no overlying erythema, induration, or other signs of local infection.  Skin prepped in a sterile fashion.  Local anesthesia: Topical Ethyl chloride.  With sterile technique and under real time ultrasound guidance: Orthovisc injected easily.  Completed without difficulty   Advised to call if fevers/chills, erythema, induration, drainage, or persistent bleeding.  Images permanently stored and available for review in the ultrasound unit.  Impression: Technically successful ultrasound guided injection.  Lot Number: [id number]  Return in one week for Orthovisc injection 2/4

## 2016-03-03 ENCOUNTER — Ambulatory Visit (INDEPENDENT_AMBULATORY_CARE_PROVIDER_SITE_OTHER): Payer: BLUE CROSS/BLUE SHIELD | Admitting: Rehabilitative and Restorative Service Providers"

## 2016-03-03 ENCOUNTER — Encounter: Payer: Self-pay | Admitting: Rehabilitative and Restorative Service Providers"

## 2016-03-03 DIAGNOSIS — R29898 Other symptoms and signs involving the musculoskeletal system: Secondary | ICD-10-CM

## 2016-03-03 DIAGNOSIS — R293 Abnormal posture: Secondary | ICD-10-CM

## 2016-03-03 DIAGNOSIS — M25551 Pain in right hip: Secondary | ICD-10-CM

## 2016-03-03 DIAGNOSIS — M25511 Pain in right shoulder: Secondary | ICD-10-CM

## 2016-03-03 NOTE — Patient Instructions (Addendum)
Modify sleeping positions  Towel at side when on your side Hips and knees bent when on your back   Quad Set    With other leg bent, foot flat, slowly tighten muscles on thigh of straight leg while counting out loud to _10 sec ___. Repeat with other leg. Repeat _10___ times. 1-3 sets of 10 Do _1___ sessions per day.     Straight Leg Raise: With External Leg Rotation    Lie on back with right leg straight, opposite leg bent. Rotate straight leg out and lift __10-12__ inches. Repeat __10__ times per set. Hold 5-10 sec  Do _1-2___ sets per session. Do _1___ sessions per day.   Strengthening: Hip Adduction - Isometric    With ball or folded pillow between knees, squeeze knees together. Hold __10__ seconds. Repeat __10__ times per set. Do __1-2__ sets per session. Do __1__ sessions per day.  HIP: Hamstrings - Supine   Place strap around foot. Raise leg up, keeping knee straight.  Bend opposite knee to protect back if indicated. Hold 30 seconds. 3 reps per set, 2-3 sets per day     Outer Hip Stretch: Reclined IT Band Stretch (Strap)   Strap around one foot, pull leg across body until you feel a pull or stretch, with shoulders on mat. Hold for 30 seconds. Repeat 3 times each leg. 2-3 times/day.

## 2016-03-03 NOTE — Therapy (Signed)
[place], [place], [address] Phone: [phone]   Fax:  [phone]  Physical Therapy Treatment  Patient Details  Name: Cleon Thoma MRN: [id number] Date of Birth: [birth year] Referring Provider: Dr Steva Colder  Encounter Date: 03/03/2016      PT End of Session - 03/03/16 1443    Visit Number 13   Number of Visits 17   Date for PT Re-Evaluation 04/01/16   PT Start Time 1430   PT Stop Time 1526   PT Time Calculation (min) 56 min   Activity Tolerance Patient tolerated treatment well      Past Medical History:  Diagnosis Date  . Depression   . Idiopathic scoliosis 01/22/2016  . Thyroid disease     Past Surgical History:  Procedure Laterality Date  . ABDOMINAL HYSTERECTOMY    . bladder mesh    . ELBOW SURGERY Right   . SPINAL FUSION    . TONSILLECTOMY    . TOTAL HIP ARTHROPLASTY      There were no vitals filed for this visit.      Subjective Assessment - 03/03/16 1444    Subjective Has had increased pain in the Rt knee which may be the reason - but she has had increased pain in the back and numbness down into both LE's when she lays on her side. She has increased pain in the LB when lying on her back.    Currently in Pain? Yes   Pain Score 7   getting out of the car    Pain Location Knee   Pain Orientation Right   Pain Descriptors / Indicators Aching   Pain Type Chronic pain   Pain Onset More than a month ago   Pain Score 0   Pain Location Hip   Pain Orientation Right   Pain Descriptors / Indicators Numbness  numbness at night starting this weekend    Pain Type Chronic pain   Pain Score 0   Pain Location Hip   Pain Orientation Left   Pain Descriptors / Indicators Numbness   Pain Type Chronic pain   Pain Onset More than a month ago   Pain Frequency Intermittent                         [facility] Adult PT Treatment/Exercise - 03/03/16 0001      Knee/Hip  Exercises: Stretches   Passive Hamstring Stretch 3 reps;30 seconds   ITB Stretch 3 reps;30 seconds     Knee/Hip Exercises: Supine   Quad Sets Strengthening;Right;5 reps  10 sec    Bridges Limitations bridging 10 sec hold x 10    Bridges with Clamshell Strengthening;10 reps;3 sets  with red TB distal thighs    Straight Leg Raise with External Rotation Right;5 reps  10 sec hold    Other Supine Knee/Hip Exercises clams with red band 3x10, Rt only x10 more.     Shoulder Exercises: Standing   External Rotation Strengthening;Right;Theraband   Theraband Level (Shoulder External Rotation) Level 3 (Green)   Extension Both;20 reps;Theraband   Theraband Level (Shoulder Extension) Level 3 (Green)   Row [employer];Theraband   Retraction Both;20 reps;Theraband   Theraband Level (Shoulder Retraction) Level 2 (Red)     Shoulder Exercises: Stretch   Other Shoulder Stretches doorway stretch low and mid levels 30 sec hold x 3 reps;      Moist Heat Therapy   Number Minutes Moist  Heat 20 Minutes   Moist Heat Location Shoulder;Knee;Lumbar Spine     Electrical Stimulation   Electrical Stimulation Location Rt shoulder/deltiod    Electrical Stimulation Action IFC   Electrical Stimulation Parameters to tolerance   Electrical Stimulation Goals Pain;Tone     Manual Therapy   Manual Therapy Soft tissue mobilization   Manual therapy comments TPR, STM to Rt upper trap, rhomboid, levator scapulae regions.   Soft tissue mobilization Rt lateral hip/TFL/ITB   Myofascial Release Rt lateral hip    Kinesiotex --  taping for lateral tracking          Trigger Point Dry Needling - 03/03/16 1530    Consent Given? Yes   Tensor Fascia Lata Response Palpable increased muscle length              PT Education - 03/03/16 1506    Education provided Yes   Education Details HEP   Person(s) Educated Patient   Methods Explanation;Demonstration;Tactile cues;Verbal cues   Comprehension Verbalized  understanding;Returned demonstration;Verbal cues required;Tactile cues required             PT Long Term Goals - 02/27/16 1116      PT LONG TERM GOAL #1   Title Improve muscular extensibility and ROM through Rt shoulder girdle and hip musculature to palpation (04/01/16)   Time 6   Period Weeks   Status On-going     PT LONG TERM GOAL #2   Title Increase shoulder ROM by 5-10 degrees bilat flexion Rt ER/abd 04/01/16   Time 6   Period Weeks   Status Partially Met     PT LONG TERM GOAL #3   Title Decrease frequency; intensity and duration of pain in the Rt shd/hip 02/12/16   Time 6   Period Weeks   Status Achieved     PT LONG TERM GOAL #4   Title Independent in HEP 04/01/16   Time 6   Period Weeks   Status On-going     PT LONG TERM GOAL #5   Title Improve FOTO to </= 42% limitation 04/01/16   Time 6   Period Weeks   Status On-going               Plan - 03/03/16 1530    Clinical Impression Statement Cpotninued pain with increased LBP likely related to the position she is in at night. Responded well to taping. Good release with TDN and manual work.    Rehab Potential Good   PT Frequency 2x / week   PT Duration 6 weeks   PT Treatment/Interventions Patient/family education;ADLs/Self Care Home Management;Neuromuscular re-education;Cryotherapy;Electrical Stimulation;Iontophoresis 26m/ml Dexamethasone;Moist Heat;Ultrasound;Dry needling;Manual techniques;Therapeutic activities;Therapeutic exercise   PT Next Visit Plan continue tape Rt knee, progress strengthening Rt RTC and eccentric Rt quad, glut medius.    Consulted and Agree with Plan of Care Patient      Patient will benefit from skilled therapeutic intervention in order to improve the following deficits and impairments:  Postural dysfunction, Improper body mechanics, Pain, Decreased range of motion, Decreased mobility, Decreased strength, Decreased activity tolerance  Visit Diagnosis: Acute pain of right  shoulder  Abnormal posture  Other symptoms and signs involving the musculoskeletal system  Pain in right hip     Problem List Patient Active Problem List   Diagnosis Date Noted  . Right knee pain 02/19/2016  . Rosacea 02/04/2016  . Vaginal atrophy 01/31/2016  . Idiopathic scoliosis 01/22/2016  . Hip bursitis 12/25/2015  . Biceps tendonitis on right 12/25/2015  .  Diverticulitis of colon 12/21/2015  . GERD (gastroesophageal reflux disease) 11/27/2015  . Osteoporosis 11/27/2015  . GAD (generalized anxiety disorder) 11/27/2015  . MDD (major depressive disorder), recurrent, in full remission ([place]) 11/27/2015  . Chronic dryness of both eyes 11/27/2015  . Hypothyroidism 11/27/2015  . Asthma, chronic 11/27/2015  . Insomnia 11/27/2015  . Seborrheic keratoses 11/27/2015    [place]mit Ar[place]ave Nilda Simmer PT, MPH  03/03/2016, 3:33 PM  [facility] [place], [place], [address] Phone: [phone]   Fax:  [phone]  Name: Dianne Whelchel MRN: [id number] Date of Birth: 07-17-1955

## 2016-03-05 ENCOUNTER — Encounter: Payer: Self-pay | Admitting: Family Medicine

## 2016-03-05 ENCOUNTER — Ambulatory Visit (INDEPENDENT_AMBULATORY_CARE_PROVIDER_SITE_OTHER): Payer: BLUE CROSS/BLUE SHIELD | Admitting: Family Medicine

## 2016-03-05 VITALS — BP 150/88 | HR 101 | Ht <= 58 in | Wt 119.0 lb

## 2016-03-05 DIAGNOSIS — M41125 Adolescent idiopathic scoliosis, thoracolumbar region: Secondary | ICD-10-CM

## 2016-03-05 DIAGNOSIS — M25561 Pain in right knee: Secondary | ICD-10-CM

## 2016-03-05 NOTE — Progress Notes (Signed)
Leslie Roberson is a 60 y.o. female who presents to [facility] today for back pain and follow-up knee arthritis Orthovisc injections.  Back pain: Patient has a history of scoliosis.  She has intermittent chronic back pain. Recently she notes numbness and tingling radiating to her legs bilaterally. This is not painful and not associated with weakness or complete loss of sensation. She denies any bowel or bladder dysfunction. Additionally she notes a history of nerve ablation procedures for facet arthritis several years ago when living in [place]. She is interested in repeating these procedures if possible.   Knee arthritis: Patient is here today for previously scheduled Orthovisc injections. Today is number 2/4 right knee. She notes she did pretty well but did have some worsening knee pain several days after the injection last week. She denies any significant redness and swelling fevers or chills.   Past Medical History:  Diagnosis Date  . Depression   . Idiopathic scoliosis 01/22/2016  . Thyroid disease    Past Surgical History:  Procedure Laterality Date  . ABDOMINAL HYSTERECTOMY    . bladder mesh    . ELBOW SURGERY Right   . SPINAL FUSION    . TONSILLECTOMY    . TOTAL HIP ARTHROPLASTY     Social History  Substance Use Topics  . Smoking status: Never Smoker  . Smokeless tobacco: Never Used  . Alcohol use Yes     ROS:  As above   Medications: Current Outpatient Prescriptions  Medication Sig Dispense Refill  . acetaminophen-codeine (TYLENOL #3) 300-30 MG tablet Take by mouth as needed for moderate pain.    Marland Kitchen albuterol (PROVENTIL HFA;VENTOLIN HFA) 108 (90 Base) MCG/ACT inhaler Inhale into the lungs as needed for wheezing or shortness of breath.    . busPIRone (BUSPAR) 30 MG tablet TAKE 1 TABLET (30 MG TOTAL) BY MOUTH 2 (TWO) TIMES DAILY. 60 tablet 0  . Carisoprodol (SOMA PO) Take by mouth 4 (four) times daily as needed.     . clonazePAM (KLONOPIN) 2 MG tablet TAKE 1 AND 1/2 TABLET BY MOUTH DAILY 45 tablet 0  . cycloSPORINE (RESTASIS) 0.05 % ophthalmic emulsion Place 1 drop into both eyes 2 (two) times daily.    Marland Kitchen denosumab (PROLIA) 60 MG/ML SOLN injection Inject 60 mg into the skin every 6 (six) months. Administer in upper arm, thigh, or abdomen    . desvenlafaxine (PRISTIQ) 100 MG 24 hr tablet Take 100 mg by mouth daily.    Marland Kitchen desvenlafaxine (PRISTIQ) 50 MG 24 hr tablet TAKE 1 TABLET BY MOUTH DAILY 30 tablet 4  . DEXILANT 60 MG capsule TAKE 1 CAPSULE (60 MG) BY MOUTH DAILY 30 capsule 3  . diclofenac sodium (VOLTAREN) 1 % GEL Apply 4 g topically 4 (four) times daily. 2 Tube 2  . estradiol (ESTRACE VAGINAL) 0.1 MG/GM vaginal cream Place 1 Applicatorful vaginally at bedtime. 42.5 g 12  . flunisolide (NASAREL) 29 MCG/ACT (0.025%) nasal spray Place 2 sprays into the nose daily as needed for rhinitis. Dose is for each nostril.    . fluticasone furoate-vilanterol (BREO ELLIPTA) 100-25 MCG/INH AEPB Inhale 1 puff into the lungs daily.    . hydrocortisone 2.5 % ointment Apply to affected area BID. 30 g 1  . lamoTRIgine (LAMICTAL) 200 MG tablet TAKE ONE TABLET BY MOUTH AT BEDTIME 90 tablet 0  . levothyroxine (SYNTHROID, LEVOTHROID) 125 MCG tablet Take 62.5 mcg by mouth daily before breakfast.    . metroNIDAZOLE (METROGEL) 1 %  gel Apply topically daily. 45 g 0  . naproxen sodium (ANAPROX) 220 MG tablet Take 220 mg by mouth as needed.    . traZODone (DESYREL) 50 MG tablet Take 1 tablet (50 mg total) by mouth at bedtime. 90 tablet 1   No current facility-administered medications for this visit.    Allergies  Allergen Reactions  . Adhesive [Tape]   . Demerol [Meperidine]   . Dilaudid [Hydromorphone]   . Levofloxacin      Exam:  BP (!) 150/88   Pulse (!) 101   Ht 4\' 10"  (1.473 m)   Wt 119 lb (54 kg)   BMI 24.87 kg/m  General: Well Developed, well nourished, and in no acute distress.  Neuro/Psych: Alert and  oriented x3, extra-ocular muscles intact, able to move all 4 extremities, sensation grossly intact. Skin: Warm and dry, no rashes noted.  Respiratory: Not using accessory muscles, speaking in full sentences, trachea midline.  Cardiovascular: Pulses palpable, no extremity edema. Abdomen: Does not appear distended. MSK: Scoliosis present. L-spine is nontender. Decreased lumbar motion due to DJD and pain. Negative slump test bilaterally. Normal gait.  Right knee: Mild effusion present no erythema nontender normal motion   Right knee injection Orthovisc 2/4  Procedure: Real-time Ultrasound Guided Injection of right knee  Device: GE Logiq E  Images permanently stored and available for review in the ultrasound unit. Verbal informed consent obtained. Discussed risks and benefits of procedure. Warned about infection bleeding damage to structures skin hypopigmentation and fat atrophy among others. Patient expresses understanding and agreement Time-out conducted.  Noted no overlying erythema, induration, or other signs of local infection.  Skin prepped in a sterile fashion.  Local anesthesia: Topical Ethyl chloride.  With sterile technique and under real time ultrasound guidance: Orthovisc injected easily.  Completed without difficulty   Advised to call if fevers/chills, erythema, induration, drainage, or persistent bleeding.  Images permanently stored and available for review in the ultrasound unit.  Impression: Technically successful ultrasound guided injection.     No results found for this or any previous visit (from the past 48 hour(s)). No results found.    Assessment and Plan: 60 y.o. female with  Back pain: DDD with scoliosis. Possible nerve impingement. We'll obtain medical records from prior ablation clinic and recheck in 1 week.   Knee pain: Orthovisc injection 2/4 today. Return in one week for injection 3/4    No orders of the defined types were placed in  this encounter.   Discussed warning signs or symptoms. Please see discharge instructions. Patient expresses understanding.

## 2016-03-05 NOTE — Patient Instructions (Signed)
Thank you for coming in today. We will recheck in 1 week.  We will also try to get records from your prior injection doctors.

## 2016-03-07 ENCOUNTER — Other Ambulatory Visit: Payer: Self-pay | Admitting: Physician Assistant

## 2016-03-07 ENCOUNTER — Ambulatory Visit: Payer: BLUE CROSS/BLUE SHIELD

## 2016-03-07 DIAGNOSIS — M810 Age-related osteoporosis without current pathological fracture: Secondary | ICD-10-CM | POA: Diagnosis not present

## 2016-03-08 LAB — BASIC METABOLIC PANEL WITH GFR
BUN: 19 mg/dL (ref 7–25)
CALCIUM: 9.4 mg/dL (ref 8.6–10.4)
CO2: 28 mmol/L (ref 20–31)
CREATININE: 0.85 mg/dL (ref 0.50–0.99)
Chloride: 103 mmol/L (ref 98–110)
GFR, Est African American: 86 mL/min (ref >=60)
GFR, Est Non African American: 75 mL/min (ref >=60)
GLUCOSE: 109 mg/dL — AB (ref 65–99)
Potassium: 3.8 mmol/L (ref 3.5–5.3)
Sodium: 139 mmol/L (ref 135–146)

## 2016-03-10 ENCOUNTER — Ambulatory Visit (INDEPENDENT_AMBULATORY_CARE_PROVIDER_SITE_OTHER): Payer: BLUE CROSS/BLUE SHIELD | Admitting: Physician Assistant

## 2016-03-10 VITALS — BP 121/70 | HR 91 | Wt 120.0 lb

## 2016-03-10 DIAGNOSIS — M810 Age-related osteoporosis without current pathological fracture: Secondary | ICD-10-CM | POA: Diagnosis not present

## 2016-03-10 MED ORDER — DENOSUMAB 60 MG/ML ~~LOC~~ SOLN
60.0000 mg | Freq: Once | SUBCUTANEOUS | Status: AC
  Administered 2016-03-10: 60 mg via SUBCUTANEOUS

## 2016-03-10 NOTE — Progress Notes (Signed)
Patient came into clinic today for her Prolia injection. Pt reports she used to get these when she lived in [place] but this was her first injection here. Patient preferred to get the injection in her right lower abdomen today. Pt tolerated injection well, no immediate complications. Pt advised to start taking OTC calcium while on Prolia, verbalized understanding. No further questions at this time.

## 2016-03-12 ENCOUNTER — Ambulatory Visit (INDEPENDENT_AMBULATORY_CARE_PROVIDER_SITE_OTHER): Payer: BLUE CROSS/BLUE SHIELD | Admitting: Family Medicine

## 2016-03-12 ENCOUNTER — Other Ambulatory Visit: Payer: Self-pay | Admitting: Physician Assistant

## 2016-03-12 VITALS — BP 116/74 | HR 82

## 2016-03-12 DIAGNOSIS — M25561 Pain in right knee: Secondary | ICD-10-CM | POA: Diagnosis not present

## 2016-03-12 NOTE — Patient Instructions (Signed)
Thank you for coming in today.   Call or go to the ER if you develop a large red swollen joint with extreme pain or oozing puss.   

## 2016-03-12 NOTE — Progress Notes (Signed)
  Right knee injection Orthovisc 3/4  Procedure: Real-time Ultrasound Guided Injection of right knee Device: GE Logiq E  Images permanently stored and available for review in the ultrasound unit. Verbal informed consent obtained. Discussed risks and benefits of procedure. Warned about infection bleeding damage to structures skin hypopigmentation and fat atrophy among others. Patient expresses understanding and agreement Time-out conducted.  Noted no overlying erythema, induration, or other signs of local infection.  Skin prepped in a sterile fashion.  Local anesthesia: Topical Ethyl chloride.  With sterile technique and under real time ultrasound guidance: Orthoviscinjected easily.  Completed without difficulty   Advised to call if fevers/chills, erythema, induration, drainage, or persistent bleeding.  Images permanently stored and available for review in the ultrasound unit.  Impression: Technically successful ultrasound guided injection.  Return in one week for Orthovisc injection 4/4

## 2016-03-13 ENCOUNTER — Ambulatory Visit (INDEPENDENT_AMBULATORY_CARE_PROVIDER_SITE_OTHER): Payer: BLUE CROSS/BLUE SHIELD | Admitting: Physical Therapy

## 2016-03-13 DIAGNOSIS — M25511 Pain in right shoulder: Secondary | ICD-10-CM

## 2016-03-13 DIAGNOSIS — R293 Abnormal posture: Secondary | ICD-10-CM | POA: Diagnosis not present

## 2016-03-13 DIAGNOSIS — M25551 Pain in right hip: Secondary | ICD-10-CM

## 2016-03-13 DIAGNOSIS — R29898 Other symptoms and signs involving the musculoskeletal system: Secondary | ICD-10-CM

## 2016-03-13 NOTE — Therapy (Signed)
[place], [place], [address] Phone: [phone]   Fax:  [phone]  Physical Therapy Treatment  Patient Details  Name: Leslie Roberson MRN: [id number] Date of Birth: Jan 13, 1956 Referring Provider: Dr. Georgina Snell   Encounter Date: 03/13/2016      PT End of Session - 03/13/16 1511    Visit Number 14   Number of Visits 17   Date for PT Re-Evaluation 04/01/16   PT Start Time 3435   PT Stop Time 1508   PT Time Calculation (min) 63 min   Activity Tolerance Patient tolerated treatment well      Past Medical History:  Diagnosis Date  . Depression   . Idiopathic scoliosis 01/22/2016  . Thyroid disease     Past Surgical History:  Procedure Laterality Date  . ABDOMINAL HYSTERECTOMY    . bladder mesh    . ELBOW SURGERY Right   . SPINAL FUSION    . TONSILLECTOMY    . TOTAL HIP ARTHROPLASTY      There were no vitals filed for this visit.      Subjective Assessment - 03/13/16 1408    Subjective Pt reports she feels the weather has really affecting how she feels. "Every where is sore."  She has been sleeping in recliner the last few days because it is painful in sidelying or supine.     Pertinent History Scoliosis; spinal fusion thoracic and lumbar in 1969 and 1970; chronic scapular and shoulder pain; lt hip replacement 1998; dislocated elbow ~6 years ago    Pain Score 7    Pain Location Knee   Pain Descriptors / Indicators Aching            [facility] PT Assessment - 03/13/16 0001      Assessment   Medical Diagnosis Rt hip bursitis; Rt arm/shoulder pain    Referring Provider Dr. Georgina Snell    Onset Date/Surgical Date 05/02/15   Hand Dominance Right     AROM   Right Shoulder Flexion 134 Degrees  standing   Right Shoulder ABduction 118 Degrees   Right Shoulder Internal Rotation --  behind back to ~ L3     Strength   Right/Left Hip Right           [facility] Adult PT Treatment/Exercise -  03/13/16 0001      Self-Care   Self-Care Other Self-Care Comments   Other Self-Care Comments  Pt educated on self massage with theracane and ball to Rt shoulder/ Rt lateral and posterior hip.  Pt verbalized understanding and returned demo.      Exercises   Exercises Lumbar     Lumbar Exercises: Stretches   Passive Hamstring Stretch 3 reps;30 seconds     Lumbar Exercises: Machines for Strengthening   Stationary Bike Nustep L5 x 7 min (arms/legs)     Lumbar Exercises: Quadruped   Single Arm Raise 5 reps;Right;Left   Other Quadruped Lumbar Exercises (with knees on 3" foam): fire hydrants x 5 reps each side.      Knee/Hip Exercises: Standing   Hip Abduction Stengthening;Right;Left;2 sets;10 reps;Knee straight     Knee/Hip Exercises: Supine   Straight Leg Raise with External Rotation Strengthening;Right;1 set;10 reps   Other Supine Knee/Hip Exercises clams with blue band around thighs x 10 reps x 2 sets      Shoulder Exercises: Standing   External Rotation Strengthening;Right;Theraband;15 reps   Theraband Level (Shoulder External Rotation) Level 1 (Yellow)   Row Both;20 reps;Theraband  Theraband Level (Shoulder Row) Level 2 (Red)     Moist Heat Therapy   Number Minutes Moist Heat 15 Minutes   Moist Heat Location Cervical;Hip     Electrical Stimulation   Electrical Stimulation Location Rt posterior shoulder / Rt hip   Electrical Stimulation Action premod to each area   Electrical Stimulation Parameters to tolerance    Electrical Stimulation Goals Pain;Tone     Manual Therapy   Kinesiotex --  tape held due to skin irritation.                      PT Long Term Goals - 03/13/16 1417      PT LONG TERM GOAL #1   Title Improve muscular extensibility and ROM through Rt shoulder girdle and hip musculature to palpation (04/01/16)   Time 6   Period Weeks   Status On-going     PT LONG TERM GOAL #2   Title Increase shoulder ROM by 5-10 degrees bilat flexion Rt  ER/abd 04/01/16   Time 6   Period Weeks   Status Partially Met     PT LONG TERM GOAL #3   Title Decrease frequency; intensity and duration of pain in the Rt shd/hip 02/12/16   Time 6   Period Weeks   Status Achieved     PT LONG TERM GOAL #4   Title Independent in HEP 04/01/16   Time 6   Period Weeks   Status On-going     PT LONG TERM GOAL #5   Title Improve FOTO to </= 42% limitation 04/01/16   Time 6   Period Weeks   Status On-going               Plan - 03/13/16 1538    Clinical Impression Statement Pt's Rt shoulderROM remains unchanged.  Pt tolerated hooklying position during modalities, as well as standing/ supine exercises without increase in pain.  Pt unable to tolerate trial of quadruped exercise.   No new goals    Rehab Potential Good   PT Frequency 2x / week   PT Duration 6 weeks   PT Treatment/Interventions Patient/family education;ADLs/Self Care Home Management;Neuromuscular re-education;Cryotherapy;Electrical Stimulation;Iontophoresis 58m/ml Dexamethasone;Moist Heat;Ultrasound;Dry needling;Manual techniques;Therapeutic activities;Therapeutic exercise   PT Next Visit Plan continue tape Rt knee, progress strengthening Rt RTC and eccentric Rt quad, glut medius.    Consulted and Agree with Plan of Care Patient      Patient will benefit from skilled therapeutic intervention in order to improve the following deficits and impairments:  Postural dysfunction, Improper body mechanics, Pain, Decreased range of motion, Decreased mobility, Decreased strength, Decreased activity tolerance  Visit Diagnosis: Acute pain of right shoulder  Abnormal posture  Other symptoms and signs involving the musculoskeletal system  Pain in right hip     Problem List Patient Active Problem List   Diagnosis Date Noted  . Right knee pain 02/19/2016  . Rosacea 02/04/2016  . Vaginal atrophy 01/31/2016  . Idiopathic scoliosis 01/22/2016  . Hip bursitis 12/25/2015  . Biceps tendonitis  on right 12/25/2015  . Diverticulitis of colon 12/21/2015  . GERD (gastroesophageal reflux disease) 11/27/2015  . Osteoporosis 11/27/2015  . GAD (generalized anxiety disorder) 11/27/2015  . MDD (major depressive disorder), [place] 11/27/2015  . Chronic dryness of both eyes 11/27/2015  . Hypothyroidism 11/27/2015  . Asthma, chronic 11/27/2015  . Insomnia 11/27/2015  . Seborrheic keratoses 11/27/2015   JKerin Perna PTA 03/13/16 5:03 PM  [facility]  [place], [place], [address] Phone: [phone]   Fax:  [phone]  Name: Leslie Roberson MRN: [id number] Date of Birth: 1955-08-31

## 2016-03-16 ENCOUNTER — Other Ambulatory Visit: Payer: Self-pay | Admitting: Physician Assistant

## 2016-03-17 ENCOUNTER — Ambulatory Visit (INDEPENDENT_AMBULATORY_CARE_PROVIDER_SITE_OTHER): Payer: BLUE CROSS/BLUE SHIELD | Admitting: Rehabilitative and Restorative Service Providers"

## 2016-03-17 ENCOUNTER — Encounter: Payer: Self-pay | Admitting: Rehabilitative and Restorative Service Providers"

## 2016-03-17 DIAGNOSIS — R29898 Other symptoms and signs involving the musculoskeletal system: Secondary | ICD-10-CM | POA: Diagnosis not present

## 2016-03-17 DIAGNOSIS — M25511 Pain in right shoulder: Secondary | ICD-10-CM

## 2016-03-17 DIAGNOSIS — M25551 Pain in right hip: Secondary | ICD-10-CM | POA: Diagnosis not present

## 2016-03-17 DIAGNOSIS — R293 Abnormal posture: Secondary | ICD-10-CM | POA: Diagnosis not present

## 2016-03-17 NOTE — Therapy (Addendum)
[place], [place], [address] Phone: [phone]   Fax:  [phone]  Physical Therapy Treatment  Patient Details  Name: Leslie Roberson MRN: [id number] Date of Birth: 1956-01-19 Referring Provider: Dr Georgina Snell  Encounter Date: 03/17/2016      PT End of Session - 03/17/16 1436    Visit Number 15   Number of Visits 17   Date for PT Re-Evaluation 04/01/16   PT Start Time 3832   PT Stop Time 1524   PT Time Calculation (min) 53 min   Activity Tolerance Patient tolerated treatment well      Past Medical History:  Diagnosis Date  . Depression   . Idiopathic scoliosis 01/22/2016  . Thyroid disease     Past Surgical History:  Procedure Laterality Date  . ABDOMINAL HYSTERECTOMY    . bladder mesh    . ELBOW SURGERY Right   . SPINAL FUSION    . TONSILLECTOMY    . TOTAL HIP ARTHROPLASTY      There were no vitals filed for this visit.      Subjective Assessment - 03/17/16 1437    Subjective Patient reports that she continues to have pain in the Rt shoulder after lying on the Rt side. She has continued pain in the Rt hip with lying on the Rt hip and walking/living life. The knee continues to be painful with minor change with injections. She is working on exercises at home when she can. Sometimes she can't do knee exercises if she has pain in the knee. Doing the others.    Currently in Pain? Yes   Pain Location Shoulder   Pain Orientation Right   Pain Descriptors / Indicators Aching;Tightness   Pain Type Chronic pain   Pain Onset More than a month ago   Pain Frequency Intermittent   Pain Score 9   Pain Location Hip   Pain Orientation Right;Left   Pain Descriptors / Indicators Aching;Tightness   Pain Type Chronic pain   Pain Onset More than a month ago   Pain Frequency Intermittent   Aggravating Factors  lying on the Rt side; walking any distances    Pain Relieving Factors avoiding  motions that hurt    Pain Location Knee   Pain Orientation Right   Pain Type Acute pain   Pain Onset More than a month ago   Pain Frequency Intermittent   Aggravating Factors  walking; stairs; putting sock on   Pain Relieving Factors rest             [facility] PT Assessment - 03/17/16 0001      Assessment   Medical Diagnosis Rt hip bursitis; Rt arm/shoulder pain    Referring Provider Dr Georgina Snell   Onset Date/Surgical Date 05/02/15   Hand Dominance Right   Next MD Visit 03/18/17     AROM   Overall AROM Comments AROM in standing    Right Shoulder Flexion 137 Degrees   Right Shoulder ABduction 119 Degrees   Right Shoulder Internal Rotation --  behind back to ~ L3   Right Shoulder External Rotation --  functionally places both hands behind head     Strength   Right Hip Flexion 4+/5   Right Hip Extension 5/5   Right Hip ABduction 4-/5     Flexibility   Hamstrings supine SLR Rt 95, Lt 110     Palpation   Patella mobility lateral tracking - Rt  [place] Adult PT Treatment/Exercise - 03/17/16 0001      Self-Care   Other Self-Care Comments  theracane to Rt hip     Lumbar Exercises: Supine   Clam 10 reps  engaging core    Bridge 15 reps  engaging core    Bridge Limitations trial of marching in bridge position increased Rt LE pain - stopped    Other Supine Lumbar Exercises dead bug opposite arm/leg x 20      Knee/Hip Exercises: Stretches   Passive Hamstring Stretch 3 reps;30 seconds     Knee/Hip Exercises: Aerobic   Stationary Bike Nustep L5 x 7 min (arms at 9/legs)     Knee/Hip Exercises: Standing   Hip Abduction Stengthening;Right;Left;2 sets;10 reps;Knee straight  green TB second set leading with heel    Hip Extension Right;Left;1 set;10 reps   Extension Limitations x10 no resistance; x15 with green TB around distal thighs bilat    SLS 20-30 sec x 3 reps each LE      Knee/Hip Exercises: Supine   Other Supine Knee/Hip Exercises  clams with blue band around thighs x 10 reps x 2 sets      Moist Heat Therapy   Number Minutes Moist Heat 15 Minutes   Moist Heat Location Cervical;Hip     Electrical Stimulation   Electrical Stimulation Location Rt posterior shoulder / Rt hip   Electrical Stimulation Action premod to each area   Electrical Stimulation Parameters to tolerance   Electrical Stimulation Goals Pain;Tone     Manual Therapy   Manual Therapy Soft tissue mobilization   Manual therapy comments TPR, STM to Rt upper trap, rhomboid, levator scapulae regions.   Soft tissue mobilization Rt lateral hip/TFL/ITB   Myofascial Release Rt lateral hip           Trigger Point Dry Needling - 03/17/16 1515    Consent Given? Yes   Upper Trapezius Response Palpable increased muscle length   Levator Scapulae Response Palpable increased muscle length   Rhomboids Response Palpable increased muscle length   Gluteus Maximus Response Palpable increased muscle length   Piriformis Response Palpable increased muscle length                   PT Long Term Goals - 03/17/16 1609      PT LONG TERM GOAL #1   Title Improve muscular extensibility and ROM through Rt shoulder girdle and hip musculature to palpation (04/01/16)   Time 6   Period Weeks   Status Partially Met     PT LONG TERM GOAL #2   Title Increase shoulder ROM by 5-10 degrees bilat flexion Rt ER/abd 04/01/16   Time 6   Period Weeks   Status Partially Met     PT LONG TERM GOAL #3   Title Decrease frequency; intensity and duration of pain in the Rt shd/hip 02/12/16   Time 6   Period Weeks   Status Achieved     PT LONG TERM GOAL #4   Title Independent in HEP 04/01/16   Time 6   Period Weeks   Status Partially Met     PT LONG TERM GOAL #5   Title Improve FOTO to </= 42% limitation 04/01/16   Time 6   Period Weeks   Status On-going               Plan - 03/17/16 1611    Clinical Impression Statement Some decreased pain in Rt UE; continued  pain Rt and Lt  hips when lying on side; continued pain Rt knee with minimal change. Has HEP program to address strengthening UE's; core and bilat LE's. Positive response to TDN with decreased tightness to palpation noted through the Rt shoulder girdle and hip areas. Difficutly progressing exercise program due to multiple musculoskeletal problems, dysfunction and chronic nature of problems. Patient will discuss continued treatment with MD at visit tomorrow.    Rehab Potential Good   PT Frequency 2x / week   PT Duration 6 weeks   PT Treatment/Interventions Patient/family education;ADLs/Self Care Home Management;Neuromuscular re-education;Cryotherapy;Electrical Stimulation;Iontophoresis 12m/ml Dexamethasone;Moist Heat;Ultrasound;Dry needling;Manual techniques;Therapeutic activities;Therapeutic exercise   PT Next Visit Plan continue tape Rt knee, progress strengthening Rt RTC and eccentric Rt quad, glut medius as tolerated; TDN as indicated   Consulted and Agree with Plan of Care Patient      Patient will benefit from skilled therapeutic intervention in order to improve the following deficits and impairments:  Postural dysfunction, Improper body mechanics, Pain, Decreased range of motion, Decreased mobility, Decreased strength, Decreased activity tolerance  Visit Diagnosis: Acute pain of right shoulder  Abnormal posture  Other symptoms and signs involving the musculoskeletal system  Pain in right hip     Problem List Patient Active Problem List   Diagnosis Date Noted  . Right knee pain 02/19/2016  . Rosacea 02/04/2016  . Vaginal atrophy 01/31/2016  . Idiopathic scoliosis 01/22/2016  . Hip bursitis 12/25/2015  . Biceps tendonitis on right 12/25/2015  . Diverticulitis of colon 12/21/2015  . GERD (gastroesophageal reflux disease) 11/27/2015  . Osteoporosis 11/27/2015  . GAD (generalized anxiety disorder) 11/27/2015  . MDD (major depressive disorder), recurrent, [place] full [place]  11/27/2015  . Chronic dryness of both eyes 11/27/2015  . Hypothyroidism 11/27/2015  . Asthma, chronic 11/27/2015  . Insomnia 11/27/2015  . Seborrheic keratoses 11/27/2015   Note routed to Dr CNetta NeatPT, MPH  03/17/2016, 4:17 PM  [facility] [address]Phone: [phone]  Fax:  [phone] Name: MMarlaysia LenigMRN: [id number] of Birth: [birth year] PHYSICAL THERAPY DISCHARGE SUMMARY  Visits from Start of Care: 15  Current functional level related to goals / functional outcomes: See last progress note for discharge status   Remaining deficits: Continued pain   Education / Equipment: HEP Plan: Patient agrees to discharge.  Patient goals were partially met. Patient is being discharged due to not returning since the last visit.  ?????    Patient referred to pain management.  Feliberto Stockley P. HHelene KelpPT, MPH 04/04/16 9:47 AM

## 2016-03-18 ENCOUNTER — Ambulatory Visit (INDEPENDENT_AMBULATORY_CARE_PROVIDER_SITE_OTHER): Payer: BLUE CROSS/BLUE SHIELD | Admitting: Family Medicine

## 2016-03-18 ENCOUNTER — Encounter: Payer: Self-pay | Admitting: Family Medicine

## 2016-03-18 ENCOUNTER — Ambulatory Visit: Payer: BLUE CROSS/BLUE SHIELD | Admitting: Family Medicine

## 2016-03-18 VITALS — BP 134/79 | HR 84

## 2016-03-18 DIAGNOSIS — M25561 Pain in right knee: Secondary | ICD-10-CM | POA: Diagnosis not present

## 2016-03-18 NOTE — Patient Instructions (Signed)
Thank you for coming in today.   Call or go to the ER if you develop a large red swollen joint with extreme pain or oozing puss.    Return as needed.  

## 2016-03-18 NOTE — Progress Notes (Signed)
Right knee injection Orthovisc 4/4  Procedure: Real-time Ultrasound Guided Injection of right knee Device: GE Logiq E  Images permanently stored and available for review in the ultrasound unit. Verbal informed consent obtained. Discussed risks and benefits of procedure. Warned about infection bleeding damage to structures skin hypopigmentation and fat atrophy among others. Patient expresses understanding and agreement Time-out conducted.  Noted no overlying erythema, induration, or other signs of local infection.  Skin prepped in a sterile fashion.  Local anesthesia: Topical Ethyl chloride.  With sterile technique and under real time ultrasound guidance: Orthoviscinjected easily.  Completed without difficulty   Advised to call if fevers/chills, erythema, induration, drainage, or persistent bleeding.  Images permanently stored and available for review in the ultrasound unit.  Impression: Technically successful ultrasound guided injection.

## 2016-03-19 ENCOUNTER — Ambulatory Visit: Payer: BLUE CROSS/BLUE SHIELD | Admitting: Family Medicine

## 2016-03-25 ENCOUNTER — Encounter: Payer: Self-pay | Admitting: Physician Assistant

## 2016-04-03 ENCOUNTER — Encounter: Payer: Self-pay | Admitting: Family Medicine

## 2016-04-03 ENCOUNTER — Ambulatory Visit (INDEPENDENT_AMBULATORY_CARE_PROVIDER_SITE_OTHER): Payer: BLUE CROSS/BLUE SHIELD | Admitting: Family Medicine

## 2016-04-03 VITALS — BP 126/76 | HR 101 | Wt 121.0 lb

## 2016-04-03 DIAGNOSIS — M545 Low back pain: Secondary | ICD-10-CM

## 2016-04-03 DIAGNOSIS — M41125 Adolescent idiopathic scoliosis, thoracolumbar region: Secondary | ICD-10-CM

## 2016-04-03 DIAGNOSIS — R202 Paresthesia of skin: Secondary | ICD-10-CM | POA: Diagnosis not present

## 2016-04-03 DIAGNOSIS — M47897 Other spondylosis, lumbosacral region: Secondary | ICD-10-CM

## 2016-04-03 DIAGNOSIS — G8929 Other chronic pain: Secondary | ICD-10-CM

## 2016-04-03 DIAGNOSIS — M549 Dorsalgia, unspecified: Secondary | ICD-10-CM

## 2016-04-03 DIAGNOSIS — M47817 Spondylosis without myelopathy or radiculopathy, lumbosacral region: Secondary | ICD-10-CM | POA: Insufficient documentation

## 2016-04-03 MED ORDER — GABAPENTIN 300 MG PO CAPS: ORAL_CAPSULE | ORAL | 3 refills | Status: DC

## 2016-04-03 NOTE — Patient Instructions (Addendum)
Thank you for coming in today.  Try gabapentin at night.  You should hear from pain clinic.  Let me know if you want a MRI.   Come back or go to the emergency room if you notice new weakness new numbness problems walking or bowel or bladder problems.

## 2016-04-03 NOTE — Progress Notes (Signed)
Leslie Roberson is a 61 y.o. female who presents to [facility] today for scoliosis, numbness, and back pain.  For the past 3 weeks, she has had numbness in her shoulder, arm, leg, and foot when she lies on her side. These symptoms occur on both sides, depending on which side she lies on. When she lies on her back, numbness occurs in her right shoulder blade and anterior shoulder; she attributes this distribution to her scoliosis. The numbness wakes her up at night, and she has been sleeping in a recliner. No associated pain or weakness, bowel or bladder incontinence.  She has had chronic lower back pain that comes on while walking. She had a nerve ablation in Dec 2015 that provided pain relief for 6 months, but never got another one because her insurance did not cover it. She just finished PT last month, which she feels did not help much.  Past Medical History:  Diagnosis Date  . Depression   . Idiopathic scoliosis 01/22/2016  . Thyroid disease    Past Surgical History:  Procedure Laterality Date  . ABDOMINAL HYSTERECTOMY    . bladder mesh    . ELBOW SURGERY Right   . SPINAL FUSION    . TONSILLECTOMY    . TOTAL HIP ARTHROPLASTY     Social History  Substance Use Topics  . Smoking status: Never Smoker  . Smokeless tobacco: Never Used  . Alcohol use Yes     ROS:  As above   Medications: Current Outpatient Prescriptions  Medication Sig Dispense Refill  . acetaminophen-codeine (TYLENOL #3) 300-30 MG tablet Take by mouth as needed for moderate pain.    Marland Kitchen albuterol (PROVENTIL HFA;VENTOLIN HFA) 108 (90 Base) MCG/ACT inhaler Inhale into the lungs as needed for wheezing or shortness of breath.    . busPIRone (BUSPAR) 30 MG tablet TAKE 1 TABLET (30 MG TOTAL) BY MOUTH 2 (TWO) TIMES DAILY. 60 tablet 2  . Carisoprodol (SOMA PO) Take by mouth 4 (four) times daily as needed.    . clonazePAM (KLONOPIN) 2 MG tablet TAKE 1 AND 1/2 TABLETS BY  MOUTH DAILY 45 tablet 0  . cycloSPORINE (RESTASIS) 0.05 % ophthalmic emulsion Place 1 drop into both eyes 2 (two) times daily.    Marland Kitchen denosumab (PROLIA) 60 MG/ML SOLN injection Inject 60 mg into the skin every 6 (six) months. Administer in upper arm, thigh, or abdomen    . desvenlafaxine (PRISTIQ) 100 MG 24 hr tablet TAKE 1 TABLET BY MOUTH DAILY 30 tablet 4  . desvenlafaxine (PRISTIQ) 50 MG 24 hr tablet TAKE 1 TABLET BY MOUTH DAILY 30 tablet 4  . DEXILANT 60 MG capsule TAKE 1 CAPSULE (60 MG) BY MOUTH DAILY 30 capsule 3  . diclofenac sodium (VOLTAREN) 1 % GEL Apply 4 g topically 4 (four) times daily. 2 Tube 2  . estradiol (ESTRACE VAGINAL) 0.1 MG/GM vaginal cream Place 1 Applicatorful vaginally at bedtime. 42.5 g 12  . flunisolide (NASAREL) 29 MCG/ACT (0.025%) nasal spray Place 2 sprays into the nose daily as needed for rhinitis. Dose is for each nostril.    . fluticasone furoate-vilanterol (BREO ELLIPTA) 100-25 MCG/INH AEPB Inhale 1 puff into the lungs daily.    . hydrocortisone 2.5 % ointment Apply to affected area BID. 30 g 1  . lamoTRIgine (LAMICTAL) 200 MG tablet TAKE ONE TABLET BY MOUTH AT BEDTIME 90 tablet 0  . levothyroxine (SYNTHROID, LEVOTHROID) 125 MCG tablet Take 62.5 mcg by mouth daily before breakfast.    .  metroNIDAZOLE (METROGEL) 1 % gel Apply topically daily. 45 g 0  . naproxen sodium (ANAPROX) 220 MG tablet Take 220 mg by mouth as needed.    . traZODone (DESYREL) 50 MG tablet Take 1 tablet (50 mg total) by mouth at bedtime. 90 tablet 1   No current facility-administered medications for this visit.    Allergies  Allergen Reactions  . Adhesive [Tape]   . Demerol [Meperidine]   . Dilaudid [Hydromorphone]   . Levofloxacin      Exam:  BP 126/76   Pulse (!) 101   Wt 121 lb (54.9 kg)   BMI 25.29 kg/m  General: Well Developed, well nourished, and in no acute distress.  Neuro/Psych: Alert and oriented x3, extra-ocular muscles intact, able to move all 4 extremities, sensation  grossly intact. Skin: Warm and dry, no rashes noted.  Respiratory: Not using accessory muscles, speaking in full sentences, trachea midline.  Cardiovascular: Pulses palpable, no extremity edema. Abdomen: Does not appear distended. MSK: Lumbar spine:  Moderate-to-severe scoliosis No tenderness to palpation along midline Full strength in bilateral lower extremities Patellar and Achilles reflexes 2+ bilaterally  Cervical spine: Full strength in bilateral upper extremities No pain or numbness on flexion, extension, or rotation Biceps and brachioradialis reflexes 2+ bilaterally  No results found for this or any previous visit (from the past 48 hour(s)). No results found.   MRI lumbar spine 11/23/2013 (see "media" tab for more info): Mild broad-based disc bulge at L4-L5 and L5-S1. Severe facet hypertrophy at left L4-L5 and bilateral L5-S1. Impression: Mild spondylosis at L4/5 and L5/S1, no significant change. No abnormal enhancement.   Assessment and Plan: 61 y.o. female with scoliosis, chronic lumbar pain, and paresthesias.  Chronic lumbar pain: MRI in 2015 showed facet hypertrophy at left L4-L5 and bilateral L5-S1. She had good relief with nerve ablation in the past. - Refer to pain clinic for nerve ablation  Paresthesias: Bilateral in arms and legs when lying down without associated pain or weakness. Unable to reproduce symptoms with c-spine movement on exam. Distribution of numbness is inconsistent with any specific nerve distribution. Treat as radiculopathy. - Gabapentin at night - MRI of c-spine if no improvement    No orders of the defined types were placed in this encounter.   Discussed warning signs or symptoms. Please see discharge instructions. Patient expresses understanding.

## 2016-04-09 ENCOUNTER — Encounter: Payer: Self-pay | Admitting: Family Medicine

## 2016-04-17 ENCOUNTER — Other Ambulatory Visit: Payer: Self-pay | Admitting: Physician Assistant

## 2016-04-18 ENCOUNTER — Telehealth: Payer: Self-pay

## 2016-04-18 DIAGNOSIS — M25561 Pain in right knee: Secondary | ICD-10-CM

## 2016-04-18 NOTE — Telephone Encounter (Signed)
Pt called stating that her knee is not improved after orthovisc injections. She reports worsening levels of pain and would like to know what the next steps would be. If the plan is for a MRI the pt it ok with having the MRI ordered at this time. Please advise.

## 2016-04-21 ENCOUNTER — Ambulatory Visit (INDEPENDENT_AMBULATORY_CARE_PROVIDER_SITE_OTHER): Payer: BLUE CROSS/BLUE SHIELD

## 2016-04-21 DIAGNOSIS — M23221 Derangement of posterior horn of medial meniscus due to old tear or injury, right knee: Secondary | ICD-10-CM

## 2016-04-21 DIAGNOSIS — M25561 Pain in right knee: Secondary | ICD-10-CM

## 2016-04-21 IMAGING — MR MR KNEE*R* W/O CM
6 series · 40 of 40 positions shown · non-contrast
Comparison: Plain films of the knees 02/19/2016.

CLINICAL DATA: Right knee pain since October 2016. No known injury.

EXAM:
MRI OF THE RIGHT KNEE WITHOUT CONTRAST
TECHNIQUE: Multiplanar, multisequence MR imaging of the knee was performed. No
intravenous contrast was administered.

[Series 3: PD fat-sat · axial · 3.0mm · 0.31mm/px · z∈[-67,+39]mm · 8 of 33 slices shown (1 of 4)]
[im 1/33]
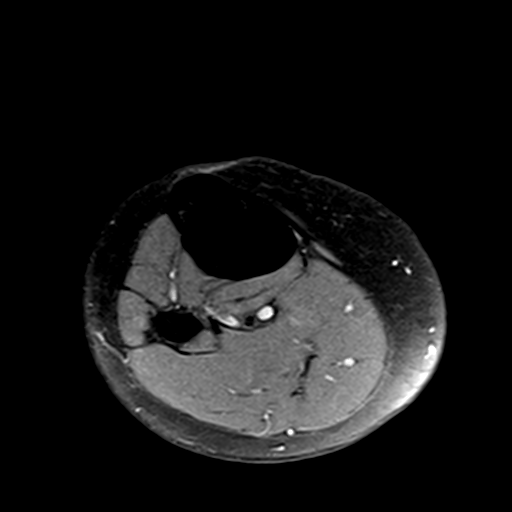
[im 5/33]
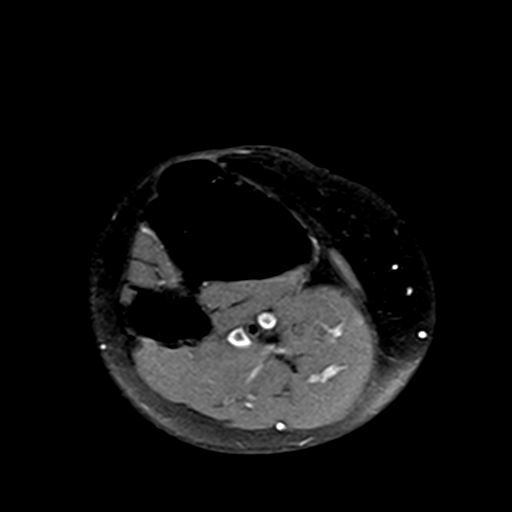
[im 10/33]
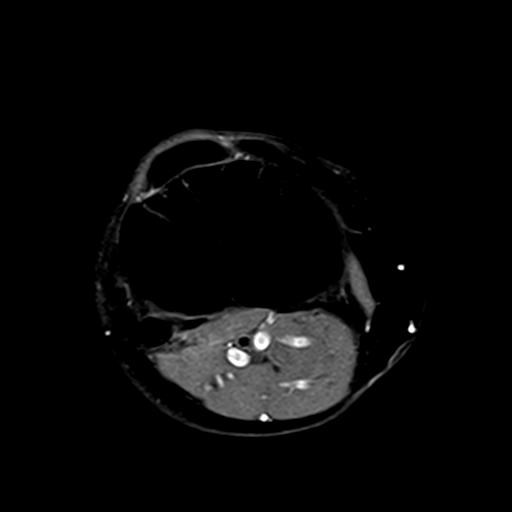
[im 14/33]
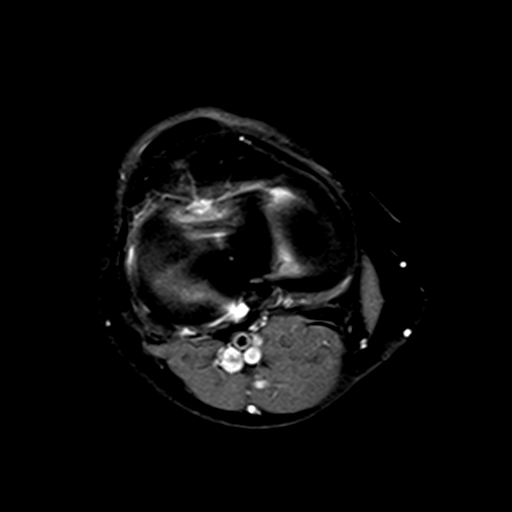
[im 19/33]
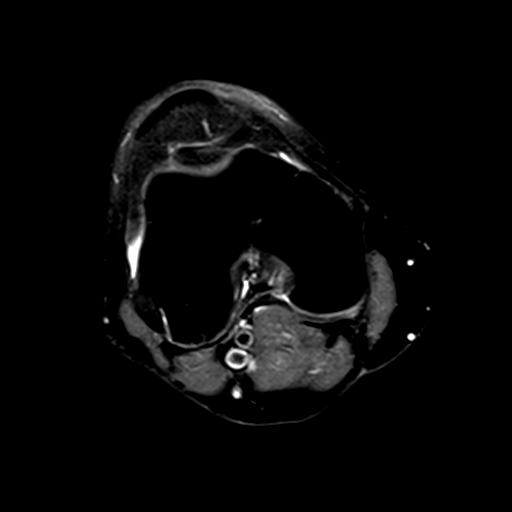
[im 23/33]
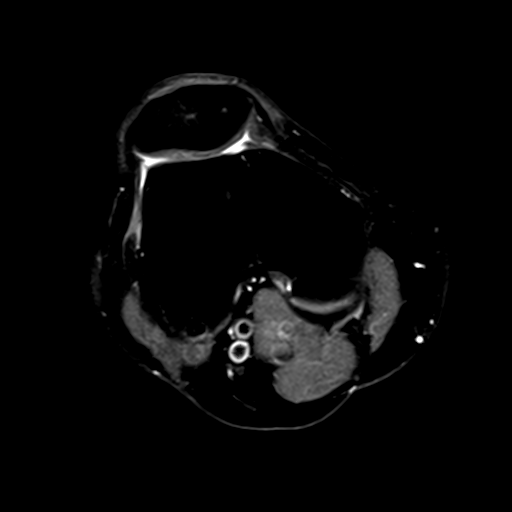
[im 28/33]
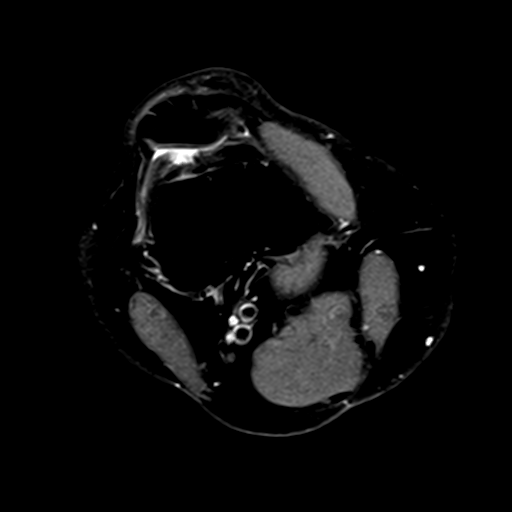
[im 33/33]
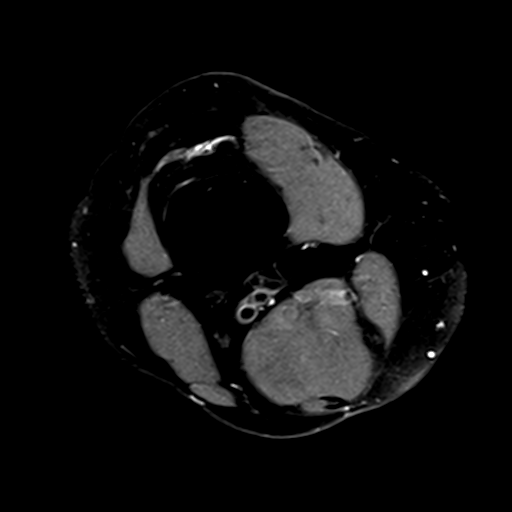

[Series 4: T1 · coronal · 3.0mm · 0.50mm/px · 7 of 29 slices shown]
[im 1/29]
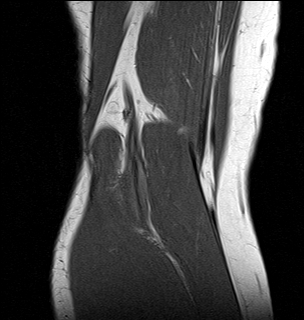
[im 5/29]
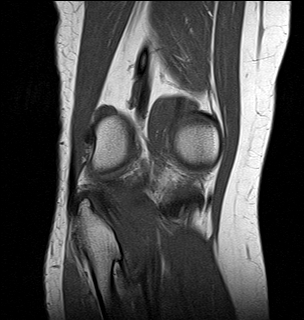
[im 10/29]
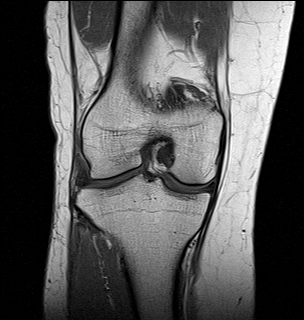
[im 15/29]
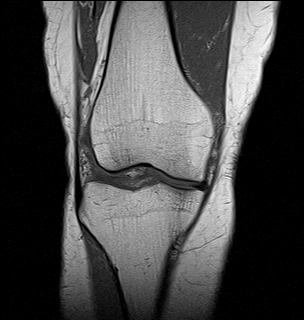
[im 19/29]
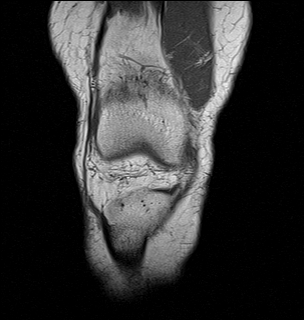
[im 24/29]
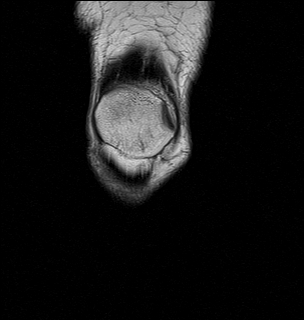
[im 29/29]
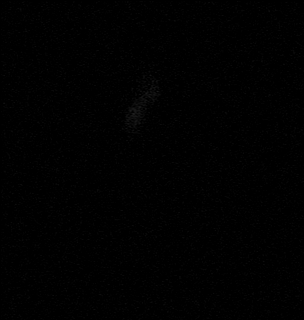

[Series 5: T2 fat-sat · coronal · 3.0mm · 0.50mm/px · 7 of 29 slices shown]
[im 1/29]
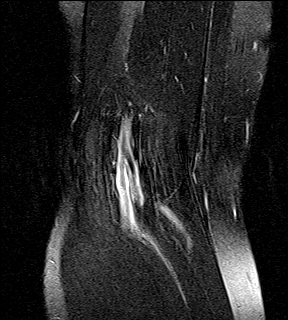
[im 5/29]
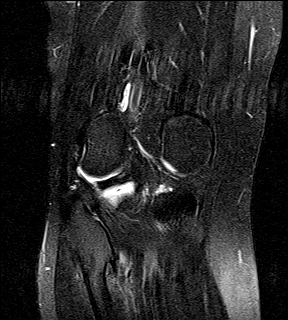
[im 10/29]
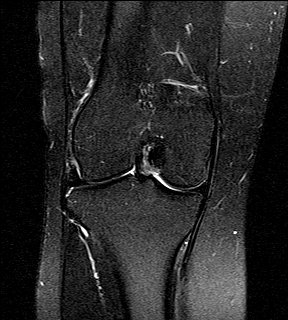
[im 15/29]
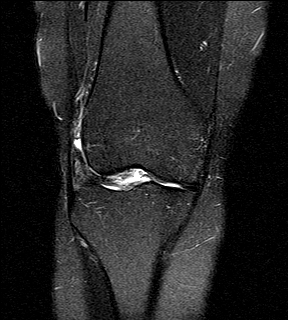
[im 19/29]
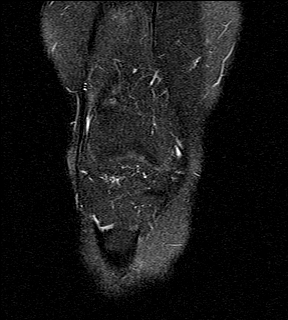
[im 24/29]
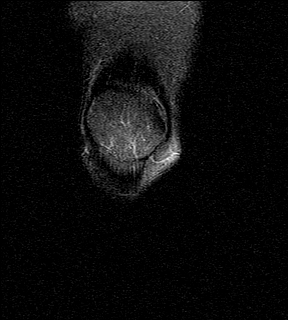
[im 29/29]
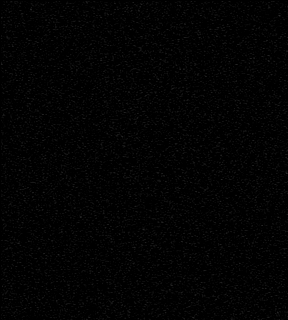

[Series 6: PD fat-sat · coronal · 3.0mm · 0.62mm/px · 7 of 29 slices shown (2 of 4)]
[im 1/29]
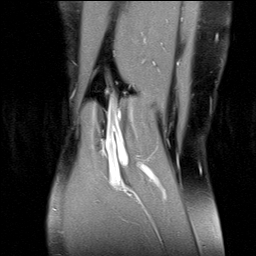
[im 5/29]
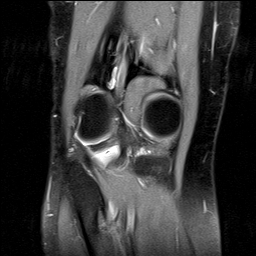
[im 10/29]
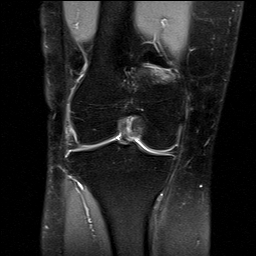
[im 15/29]
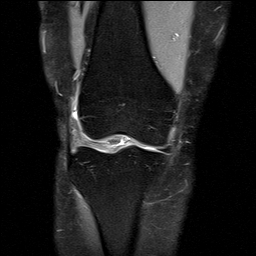
[im 19/29]
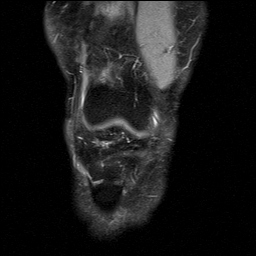
[im 24/29]
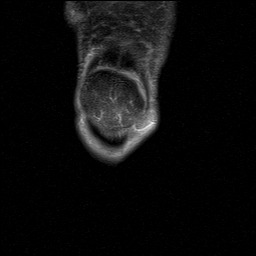
[im 29/29]
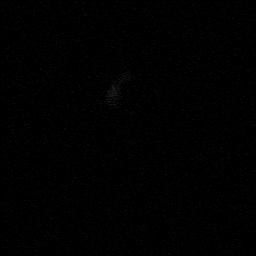

[Series 7: PD fat-sat · sagittal · 3.0mm · 0.62mm/px · 8 of 31 slices shown (3 of 4)]
[im 1/31]
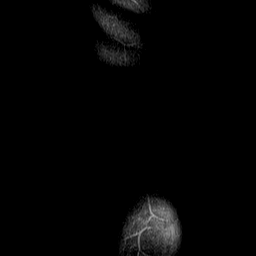
[im 5/31]
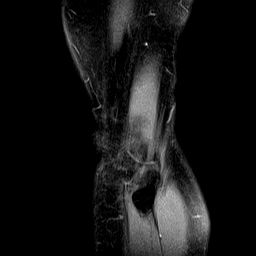
[im 9/31]
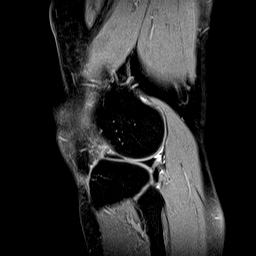
[im 13/31]
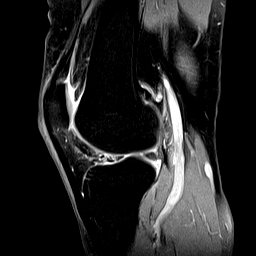
[im 18/31]
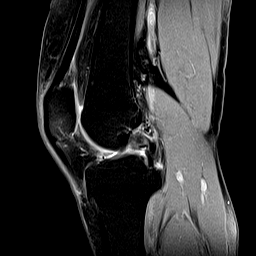
[im 22/31]
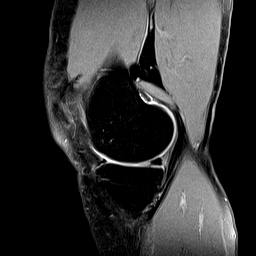
[im 26/31]
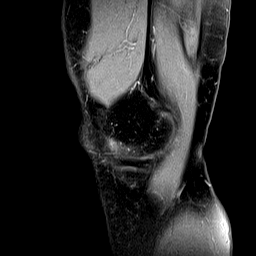
[im 31/31]
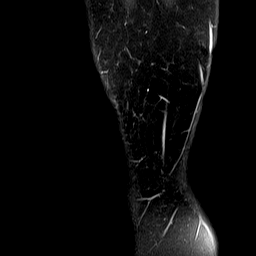

[Series 8: PD fat-sat · oblique · 2.0mm · 0.62mm/px · 3 of 11 slices shown (4 of 4)]
[im 1/11]
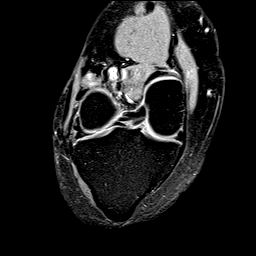
[im 6/11]
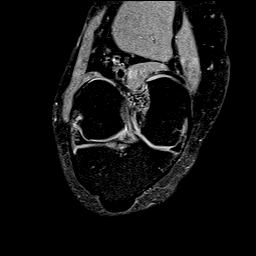
[im 11/11]
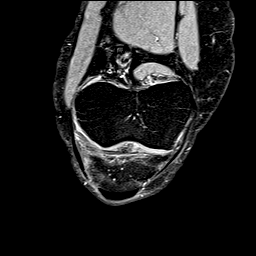

[40 of 40 positions shown; findings below may reference images not displayed]

FINDINGS: MENISCI

Medial meniscus: A radial tear is seen along the free edge of the
posterior horn of the medial meniscus at and peripheral to the
meniscal root. The tear extends peripherally to the junction of the
posterior horn and body in an oblique orientation reaching the
meniscal undersurface.

Lateral meniscus: Mild fraying along the free edge of the body is
identified. No tear.

LIGAMENTS

Cruciates:  Intact.

Collaterals:  Intact.

CARTILAGE

Patellofemoral:  Normal.

Medial:  Normal.

Lateral:  Normal.

Joint:  Very small amount of joint fluid is seen.

Popliteal Fossa:  No Baker's cyst.

Extensor Mechanism:  Intact.

Bones:  No fracture or worrisome lesion.

Other: None.
IMPRESSION: Radial tear along the free edge of the posterior horn of the medial
meniscus at and peripheral to the meniscal root extends peripherally
to the junction of the posterior horn and body in a horizontal
orientation reaching the meniscal undersurface. The study is
otherwise negative.

## 2016-04-21 NOTE — Telephone Encounter (Signed)
MRI of right knee was ordered

## 2016-04-21 NOTE — Telephone Encounter (Signed)
MRI authorized. Imaging notified.

## 2016-04-22 NOTE — Telephone Encounter (Signed)
Left vm on 04/22/15 advising pt of MRI order.

## 2016-04-23 ENCOUNTER — Encounter: Payer: Self-pay | Admitting: Family Medicine

## 2016-04-23 ENCOUNTER — Ambulatory Visit (INDEPENDENT_AMBULATORY_CARE_PROVIDER_SITE_OTHER): Payer: BLUE CROSS/BLUE SHIELD | Admitting: Family Medicine

## 2016-04-23 DIAGNOSIS — S83241A Other tear of medial meniscus, current injury, right knee, initial encounter: Secondary | ICD-10-CM

## 2016-04-23 DIAGNOSIS — S83249A Other tear of medial meniscus, current injury, unspecified knee, initial encounter: Secondary | ICD-10-CM | POA: Insufficient documentation

## 2016-04-23 NOTE — Patient Instructions (Signed)
Thank you for coming in today. You should hear from Dr Archie Endo office soon.  Let me know if you do not hear anything.  Return sooner if needed.  Get a CD made in radiology for the pictures.    Meniscus Tear Introduction A meniscus tear is a knee injury in which a piece of the meniscus is torn. The meniscus is a thick, rubbery, wedge-shaped cartilage in the knee. Two menisci are located in each knee. They sit between the upper bone (femur) and lower bone (tibia) that make up the knee joint. Each meniscus acts as a shock absorber for the knee. A torn meniscus is one of the most common types of knee injuries. This injury can range from mild to severe. Surgery may be needed for a severe tear. What are the causes? This injury may be caused by any squatting, twisting, or pivoting movement. Sports-related injuries are the most common cause. These often occur from:  Running and stopping suddenly.  Changing direction.  Being tackled or knocked off your feet. As people get older, their meniscus gets thinner and weaker. In these people, tears can happen more easily, such as from climbing stairs. What increases the risk? This injury is more likely to happen to:  People who play contact sports.  Males.  People who are 61 years of age. What are the signs or symptoms? Symptoms of this injury include:  Knee pain, especially at the side of the knee joint. You may feel pain when the injury occurs, or you may only hear a pop and feel pain later.  A feeling that your knee is clicking, catching, locking, or giving way.  Not being able to fully bend or extend your knee.  Bruising or swelling in your knee. How is this diagnosed? This injury may be diagnosed based on your symptoms and a physical exam. The physical exam may include:  Moving your knee in different ways.  Feeling for tenderness.  Listening for a clicking sound.  Checking if your knee locks or catches. You may also have  tests, such as:  X-rays.  MRI.  A procedure to look inside your knee with a narrow surgical telescope (arthroscopy). You may be referred to a knee specialist (orthopedic surgeon). How is this treated? Treatment for this injury depends on the severity of the tear. Treatment for a mild tear may include:  Rest.  Medicine to reduce pain and swelling. This is usually a nonsteroidal anti-inflammatory drug (NSAID).  A knee brace or an elastic sleeve or wrap.  Using crutches or a walker to keep weight off your knee and to help you walk.  Exercises to strengthen your knee (physical therapy). You may need surgery if you have a severe tear or if other treatments are not working. Follow these instructions at home: Managing pain and swelling  Take over-the-counter and prescription medicines only as told by your health care provider.  If directed, apply ice to the injured area:  Put ice in a plastic bag.  Place a towel between your skin and the bag.  Leave the ice on for 20 minutes, 2-3 times per day.  Raise (elevate) the injured area above the level of your heart while you are sitting or lying down. Activity  Do not use the injured limb to support your body weight until your health care provider says that you can. Use crutches or a walker as told by your health care provider.  Return to your normal activities as told by your health  care provider. Ask your health care provider what activities are safe for you.  Perform range-of-motion exercises only as told by your health care provider.  Begin doing exercises to strengthen your knee and leg muscles only as told by your health care provider. After you recover, your health care provider may recommend these exercises to help prevent another injury. General instructions  Use a knee brace or elastic wrap as told by your health care provider.  Keep all follow-up visits as told by your health care provider. This is important. Contact a  health care provider if:  You have a fever.  Your knee becomes red, tender, or swollen.  Your pain medicine is not helping.  Your symptoms get worse or do not improve after 2 weeks of home care. This information is not intended to replace advice given to you by your health care provider. Make sure you discuss any questions you have with your health care provider. Document Released: 06/07/2002 Document Revised: 08/23/2015 Document Reviewed: 07/10/2014  2017 [employer]vier

## 2016-04-23 NOTE — Progress Notes (Signed)
Leslie Roberson is a 61 y.o. female who presents to [facility] today for right knee pain.  He has had ongoing right pain pain now for months. She's had trial of steroid injection therapy as well as Hyaluronic acid injection course which have not worked. She had a MRI on Monday which showed a posterior horn medial meniscus tear. She is here today to follow-up her MRI report and discuss her options. She notes continued pain that limited her activity. She is not able to for example do the normal dancing that she loves to do.   Past Medical History:  Diagnosis Date  . Depression   . Idiopathic scoliosis 01/22/2016  . Thyroid disease    Past Surgical History:  Procedure Laterality Date  . ABDOMINAL HYSTERECTOMY    . bladder mesh    . ELBOW SURGERY Right   . SPINAL FUSION    . TONSILLECTOMY    . TOTAL HIP ARTHROPLASTY     Social History  Substance Use Topics  . Smoking status: Never Smoker  . Smokeless tobacco: Never Used  . Alcohol use Yes   family history includes COPD in her paternal aunt; Cancer in her mother; Depression in her mother; Diabetes in her maternal aunt; Heart attack in her father; Hyperlipidemia in her father and paternal aunt; Hypertension in her father.  ROS as above:  Medications: Current Outpatient Prescriptions  Medication Sig Dispense Refill  . acetaminophen-codeine (TYLENOL #3) 300-30 MG tablet Take by mouth as needed for moderate pain.    Marland Kitchen albuterol (PROVENTIL HFA;VENTOLIN HFA) 108 (90 Base) MCG/ACT inhaler Inhale into the lungs as needed for wheezing or shortness of breath.    . busPIRone (BUSPAR) 30 MG tablet TAKE 1 TABLET (30 MG TOTAL) BY MOUTH 2 (TWO) TIMES DAILY. 60 tablet 2  . Carisoprodol (SOMA PO) Take by mouth 4 (four) times daily as needed.    . clonazePAM (KLONOPIN) 2 MG tablet TAKE 1 AND 1/2 TABLETS BY MOUTH DAILY 45 tablet  0  . cycloSPORINE (RESTASIS) 0.05 % ophthalmic emulsion Place 1 drop into both eyes 2 (two) times daily.    Marland Kitchen denosumab (PROLIA) 60 MG/ML SOLN injection Inject 60 mg into the skin every 6 (six) months. Administer in upper arm, thigh, or abdomen    . desvenlafaxine (PRISTIQ) 100 MG 24 hr tablet TAKE 1 TABLET BY MOUTH DAILY 30 tablet 4  . desvenlafaxine (PRISTIQ) 50 MG 24 hr tablet TAKE 1 TABLET BY MOUTH DAILY 30 tablet 4  . DEXILANT 60 MG capsule TAKE 1 CAPSULE (60 MG) BY MOUTH DAILY 30 capsule 3  . diclofenac sodium (VOLTAREN) 1 % GEL Apply 4 g topically 4 (four) times daily. 2 Tube 2  . estradiol (ESTRACE VAGINAL) 0.1 MG/GM vaginal cream Place 1 Applicatorful vaginally at bedtime. 42.5 g 12  . flunisolide (NASAREL) 29 MCG/ACT (0.025%) nasal spray Place 2 sprays into the nose daily as needed for rhinitis. Dose is for each nostril.    . fluticasone furoate-vilanterol (BREO ELLIPTA) 100-25 MCG/INH AEPB Inhale 1 puff into the lungs daily.    Marland Kitchen gabapentin (NEURONTIN) 300 MG capsule One tab PO qHS for a week, then BID for a week, then TID. May double weekly to a max of 3,600mg /day 180 capsule 3  . hydrocortisone 2.5 % ointment Apply to affected area BID. 30 g 1  . lamoTRIgine (LAMICTAL) 200 MG tablet TAKE ONE TABLET BY MOUTH AT BEDTIME 90 tablet 0  .  levothyroxine (SYNTHROID, LEVOTHROID) 125 MCG tablet Take 62.5 mcg by mouth daily before breakfast.    . metroNIDAZOLE (METROGEL) 1 % gel Apply topically daily. 45 g 0  . naproxen sodium (ANAPROX) 220 MG tablet Take 220 mg by mouth as needed.    . traZODone (DESYREL) 50 MG tablet Take 1 tablet (50 mg total) by mouth at bedtime. 90 tablet 1   No current facility-administered medications for this visit.    Allergies  Allergen Reactions  . Adhesive [Tape]   . Demerol [Meperidine]   . Dilaudid [Hydromorphone]   . Levofloxacin     Health Maintenance Health Maintenance  Topic Date Due  . HIV Screening  12/24/2016 (Originally 09/18/1970)  . MAMMOGRAM   12/25/2017  . DEXA SCAN  12/25/2017  . PAP SMEAR  01/30/2019  . COLONOSCOPY  12/19/2020  . TETANUS/TDAP  01/29/2026  . INFLUENZA VACCINE  Completed  . ZOSTAVAX  Completed  . Hepatitis C Screening  Completed     Exam:  BP (!) 145/78   Pulse 87   Wt 126 lb (57.2 kg)   BMI 26.33 kg/m  Gen: Well NAD Right knee normal-appearing. Tender to palpation medial joint line. Normal knee motion. Mild positive medial McMurray's test.   No results found for this or any previous visit (from the past 72 hour(s)). Mr Knee Right Wo Contrast  Result Date: 04/21/2016 CLINICAL DATA:  Right knee pain since August, 2018. No known injury. EXAM: MRI OF THE RIGHT KNEE WITHOUT CONTRAST TECHNIQUE: Multiplanar, multisequence MR imaging of the knee was performed. No intravenous contrast was administered. COMPARISON:  Plain films of the knees 02/19/2016. FINDINGS: MENISCI Medial meniscus: A radial tear is seen along the free edge of the posterior horn of the medial meniscus at and peripheral to the meniscal root. The tear extends peripherally to the junction of the posterior horn and body in an oblique orientation reaching the meniscal undersurface. Lateral meniscus: Mild fraying along the free edge of the body is identified. No tear. LIGAMENTS Cruciates:  Intact. Collaterals:  Intact. CARTILAGE Patellofemoral:  Normal. Medial:  Normal. Lateral:  Normal. Joint:  Very small amount of joint fluid is seen. Popliteal Fossa:  No Baker's cyst. Extensor Mechanism:  Intact. Bones:  No fracture or worrisome lesion. Other: None. IMPRESSION: Radial tear along the free edge of the posterior horn of the medial meniscus at and peripheral to the meniscal root extends peripherally to the junction of the posterior horn and body in a horizontal orientation reaching the meniscal undersurface. The study is otherwise negative. Electronically Signed   By: Inge Rise M.D.   On: 04/21/2016 15:41      Assessment and Plan: 61 y.o. female  with posterior medial meniscus tear. Patient's knee MRI is otherwise pretty unremarkable. At this point she has failed conservative management will refer to orthopedic surgery for evaluation and possible surgery.   Orders Placed This Encounter  Procedures  . Ambulatory referral to Orthopedic Surgery    Referral Priority:   Routine    Referral Type:   Surgical    Referral Reason:   Specialty Services Required    Referred to Provider:   Elsie Saas, MD    Requested Specialty:   Orthopedic Surgery    Number of Visits Requested:   1    Discussed warning signs or symptoms. Please see discharge instructions. Patient expresses understanding.

## 2016-04-24 DIAGNOSIS — S83241A Other tear of medial meniscus, current injury, right knee, initial encounter: Secondary | ICD-10-CM | POA: Diagnosis not present

## 2016-04-28 ENCOUNTER — Ambulatory Visit (INDEPENDENT_AMBULATORY_CARE_PROVIDER_SITE_OTHER): Payer: Self-pay | Admitting: Family Medicine

## 2016-04-28 ENCOUNTER — Encounter: Payer: Self-pay | Admitting: Family Medicine

## 2016-04-28 VITALS — BP 131/70 | HR 91 | Wt 123.0 lb

## 2016-04-28 DIAGNOSIS — M25562 Pain in left knee: Secondary | ICD-10-CM

## 2016-04-28 DIAGNOSIS — M7632 Iliotibial band syndrome, left leg: Secondary | ICD-10-CM

## 2016-04-28 NOTE — Progress Notes (Signed)
Leslie Roberson is a 61 y.o. female who presents to [facility] today for left knee pain.  She noticed left lateral knee pain while she was walking 4 days ago. Pain occurs during the initial weight-bearing step. No swelling, recent trauma, falls, or changes in activity. No joint catching. She reports tearing her left LCL in the distant past while skiing; it healed with conservative treatment and has not given her significant trouble since.  She was referred to ortho last week for further evaluation of right posterior medial meniscus tear after failing conservative therapy (steroid and hyaluronic acid injections). She feels like she has been compensating for her right knee pain with her left leg while walking. Wants to know if her left knee will need surgical treatment.    Past Medical History:  Diagnosis Date  . Depression   . Idiopathic scoliosis 01/22/2016  . Thyroid disease    Past Surgical History:  Procedure Laterality Date  . ABDOMINAL HYSTERECTOMY    . bladder mesh    . ELBOW SURGERY Right   . SPINAL FUSION    . TONSILLECTOMY    . TOTAL HIP ARTHROPLASTY     Social History  Substance Use Topics  . Smoking status: Never Smoker  . Smokeless tobacco: Never Used  . Alcohol use Yes     ROS:  As above   Medications: Current Outpatient Prescriptions  Medication Sig Dispense Refill  . acetaminophen-codeine (TYLENOL #3) 300-30 MG tablet Take by mouth as needed for moderate pain.    Marland Kitchen albuterol (PROVENTIL HFA;VENTOLIN HFA) 108 (90 Base) MCG/ACT inhaler Inhale into the lungs as needed for wheezing or shortness of breath.    . busPIRone (BUSPAR) 30 MG tablet TAKE 1 TABLET (30 MG TOTAL) BY MOUTH 2 (TWO) TIMES DAILY. 60 tablet 2  . Carisoprodol (SOMA PO) Take by mouth 4 (four) times daily as needed.    . clonazePAM (KLONOPIN) 2 MG tablet TAKE 1 AND 1/2 TABLETS BY MOUTH DAILY 45 tablet 0  . cycloSPORINE (RESTASIS) 0.05 % ophthalmic  emulsion Place 1 drop into both eyes 2 (two) times daily.    Marland Kitchen denosumab (PROLIA) 60 MG/ML SOLN injection Inject 60 mg into the skin every 6 (six) months. Administer in upper arm, thigh, or abdomen    . desvenlafaxine (PRISTIQ) 100 MG 24 hr tablet TAKE 1 TABLET BY MOUTH DAILY 30 tablet 4  . desvenlafaxine (PRISTIQ) 50 MG 24 hr tablet TAKE 1 TABLET BY MOUTH DAILY 30 tablet 4  . DEXILANT 60 MG capsule TAKE 1 CAPSULE (60 MG) BY MOUTH DAILY 30 capsule 3  . diclofenac sodium (VOLTAREN) 1 % GEL Apply 4 g topically 4 (four) times daily. 2 Tube 2  . estradiol (ESTRACE VAGINAL) 0.1 MG/GM vaginal cream Place 1 Applicatorful vaginally at bedtime. 42.5 g 12  . flunisolide (NASAREL) 29 MCG/ACT (0.025%) nasal spray Place 2 sprays into the nose daily as needed for rhinitis. Dose is for each nostril.    . fluticasone furoate-vilanterol (BREO ELLIPTA) 100-25 MCG/INH AEPB Inhale 1 puff into the lungs daily.    Marland Kitchen gabapentin (NEURONTIN) 300 MG capsule One tab PO qHS for a week, then BID for a week, then TID. May double weekly to a max of 3,600mg /day 180 capsule 3  . hydrocortisone 2.5 % ointment Apply to affected area BID. 30 g 1  . lamoTRIgine (LAMICTAL) 200 MG tablet TAKE ONE TABLET BY MOUTH AT BEDTIME 90 tablet 0  . levothyroxine (SYNTHROID, LEVOTHROID) 125 MCG  tablet Take 62.5 mcg by mouth daily before breakfast.    . metroNIDAZOLE (METROGEL) 1 % gel Apply topically daily. 45 g 0  . naproxen sodium (ANAPROX) 220 MG tablet Take 220 mg by mouth as needed.    . traZODone (DESYREL) 50 MG tablet Take 1 tablet (50 mg total) by mouth at bedtime. 90 tablet 1   No current facility-administered medications for this visit.    Allergies  Allergen Reactions  . Adhesive [Tape]   . Demerol [Meperidine]   . Dilaudid [Hydromorphone]   . Levofloxacin      Exam:  BP 131/70   Pulse 91   Wt 123 lb (55.8 kg)   BMI 25.71 kg/m  General: Well Developed, well nourished, and in no acute distress.  Neuro/Psych: Alert and  oriented x3, extra-ocular muscles intact, able to move all 4 extremities, sensation grossly intact. Skin: Warm and dry, no rashes noted.  Respiratory: Not using accessory muscles, speaking in full sentences, trachea midline.  Cardiovascular: Pulses palpable, no extremity edema. Abdomen: Does not appear distended. MSK: Left knee normal-appearing. Tender to palpation at lateral tibial tubercle and distal IT band. Positive McMurray's test for lateral meniscus. Tenderness along left distal IT band when squatting.   No results found for this or any previous visit (from the past 48 hour(s)). No results found.  Assessment and Plan: 61 y.o. female with left lateral knee pain, likely distal IT band syndrome. Also consider lateral meniscus injury. Continue voltaren gel, start PT, refer for iontophoresis.    Orders Placed This Encounter  Procedures  . Ambulatory referral to Physical Therapy    Referral Priority:   Routine    Referral Type:   Physical Medicine    Referral Reason:   Specialty Services Required    Requested Specialty:   Physical Therapy    Number of Visits Requested:   1    Discussed warning signs or symptoms. Please see discharge instructions. Patient expresses understanding.

## 2016-04-28 NOTE — Patient Instructions (Signed)
Thank you for coming in today. Attend PT.  Continue voltaren gel.

## 2016-04-29 ENCOUNTER — Encounter: Payer: Self-pay | Admitting: Family Medicine

## 2016-04-30 ENCOUNTER — Telehealth: Payer: Self-pay

## 2016-04-30 NOTE — Telephone Encounter (Signed)
I called Leslie Roberson to let her know that Pain Management referrals can take a while to process and had to leave her a voicemail to return my call. - CF

## 2016-04-30 NOTE — Telephone Encounter (Signed)
Pt advises "I still have not  heard a thing from the [facility]. Leslie Roberson told me on Friday of last week that she was having it re-sent to the fax number I gave her. How long do I have to wait for them to call? Would you be able to call them and see what is going on?" via mychart. Advised that  I would forward this information to referral coordinator to contact pt with an update.

## 2016-05-01 ENCOUNTER — Other Ambulatory Visit: Payer: Self-pay | Admitting: Physician Assistant

## 2016-05-02 ENCOUNTER — Ambulatory Visit (INDEPENDENT_AMBULATORY_CARE_PROVIDER_SITE_OTHER): Payer: BLUE CROSS/BLUE SHIELD | Admitting: Physical Therapy

## 2016-05-02 DIAGNOSIS — M25562 Pain in left knee: Secondary | ICD-10-CM

## 2016-05-02 DIAGNOSIS — M6281 Muscle weakness (generalized): Secondary | ICD-10-CM | POA: Diagnosis not present

## 2016-05-02 NOTE — Therapy (Signed)
[facility] [place], [place], [address] Phone: [phone]   Fax:  [phone]  Physical Therapy Evaluation  Patient Details  Name: Leslie Roberson MRN: [id number] Date of Birth: [birth year] Referring Provider: Gregor Hams, MD  Encounter Date: 05/02/2016      PT End of Session - 05/02/16 1110    Visit Number 1   Number of Visits 12   Date for PT Re-Evaluation 06/13/16   PT Start Time [id number]   PT Stop Time 1054   PT Time Calculation (min) 39 min   Activity Tolerance Patient tolerated treatment well   Behavior During Therapy [facility] for tasks assessed/performed      Past Medical History:  Diagnosis Date  . Depression   . Idiopathic scoliosis 01/22/2016  . Thyroid disease     Past Surgical History:  Procedure Laterality Date  . ABDOMINAL HYSTERECTOMY    . bladder mesh    . ELBOW SURGERY Right   . SPINAL FUSION    . TONSILLECTOMY    . TOTAL HIP ARTHROPLASTY      There were no vitals filed for this visit.       Subjective Assessment - 05/02/16 0932    Subjective Pt is a 61 y/o female who presents to OPPT with sudden onset of L knee pain which began approx 1-2 weeks ago.  Pt reports R knee pain x 6 months and diagnosed with torn meniscus, saw Dr. Noemi Chapel and is awaiting R knee surgery.  Pt expresses L LCL tear approx 35-40 yrs ago and unsure if this is related.   Pertinent History Scoliosis; spinal fusion thoracic and lumbar in 1969 and 1970; chronic scapular and shoulder pain; lt hip replacement 1998; dislocated elbow ~6 years ago    Limitations Walking;House hold activities   How long can you sit comfortably? some discomfort in lower chairs   How long can you walk comfortably? 3-5 min   Patient Stated Goals improve pain   Currently in Pain? Yes   Pain Score 0-No pain  up to 10/10   Pain Location Knee   Pain Orientation Left   Pain Descriptors / Indicators Aching;Jabbing   Pain Type Acute pain    Pain Onset 1 to 4 weeks ago   Pain Frequency Intermittent   Aggravating Factors  walking, activity   Pain Relieving Factors rest, sitting            [facility] PT Assessment - 05/02/16 0938      Assessment   Medical Diagnosis L knee pain   Referring Provider Gregor Hams, MD   Onset Date/Surgical Date 04/21/16   Next MD Visit PRN   Prior Therapy at this clinic for other conditions     Precautions   Precautions None     Restrictions   Weight Bearing Restrictions No     Balance Screen   Has the patient fallen in the past 6 months No   Has the patient had a decrease in activity level because of a fear of falling?  No   Is the patient reluctant to leave their home because of a fear of falling?  No     Home Environment   Living Environment Private residence   Living Arrangements Spouse/significant other   Type of [place] Access Level entry   [place] None     Prior Function   Level of [place]  Unemployed   Leisure walking, hiking, swimming     Observation/Other Assessments   Focus on Therapeutic Outcomes (FOTO)  33 (67% limited; predicted 48% limited)     AROM   AROM Assessment Site Knee   Right/Left Knee Right;Left   Right Knee Extension -1   Right Knee Flexion 137   Left Knee Extension -1   Left Knee Flexion 133     Strength   Strength Assessment Site Hip;Knee   Right/Left Hip Right;Left   Right Hip Flexion 3/5   Right Hip Extension 5/5   Right Hip ABduction 4/5   Right Hip ADduction 5/5   Left Hip Flexion 3/5   Left Hip Extension 5/5   Left Hip ABduction 3/5   Left Hip ADduction 5/5   Right/Left Knee Right;Left   Right Knee Flexion 4/5   Right Knee Extension 4/5   Left Knee Flexion 4/5   Left Knee Extension 4/5     Palpation   Patella mobility lateral tracking noted on L                   [facility] Adult PT Treatment/Exercise - 05/02/16 1012      Knee/Hip Exercises:  Stretches   ITB Stretch Left;1 rep;30 seconds     Knee/Hip Exercises: Supine   Straight Leg Raise with External Rotation Left;10 reps     Knee/Hip Exercises: Sidelying   Hip ADduction Left;10 reps     Modalities   Modalities Iontophoresis     Iontophoresis   Type of Iontophoresis Dexamethasone   Location L lateral knee   Dose 1.0 cc   Time 6 hour patch                PT Education - 05/02/16 1110    Education provided Yes   Education Details clinical findings, HEP, POC, goals of care, ionto   Person(s) Educated Patient   Methods Explanation;Demonstration;Handout   Comprehension Verbalized understanding;Returned demonstration;Need further instruction             PT Long Term Goals - 05/02/16 1113      PT LONG TERM GOAL #1   Title independent with HEP (06/13/16)   Time 6   Period Weeks   Status New     PT LONG TERM GOAL #2   Title improve L hip abduction strength to 4/5 for improved activity tolerance (06/13/16)   Time 6   Period Weeks   Status New     PT LONG TERM GOAL #3   Title report ability to walk > 15 min without increase in pain for improved activity tolerance and decreased pain (06/13/16)     PT LONG TERM GOAL #4   Title improve FOTO to </= 48% limited (06/13/16)   Time 6   Period Weeks   Status New     PT LONG TERM GOAL #5   Title n/a               Plan - 05/02/16 1110    Clinical Impression Statement Pt is a 61 y/o female who presents to OPPT for low complexity PT eval for L knee pain.  Pt demonstrates pain, and decreased strength affecting mobility and ability to return to recreational activities.  Pt will benefit from PT to address deficits.  PT may be limited as pt awaiting to schedule R knee arthroscopic surgery due to torn meniscus.  Will update plan PRN pending surgery date.   Rehab Potential Good   PT Frequency 2x /  week   PT Duration 6 weeks   PT Treatment/Interventions Patient/family education;ADLs/Self Care Home  Management;Neuromuscular re-education;Cryotherapy;Electrical Stimulation;Iontophoresis 4mg /ml Dexamethasone;Moist Heat;Ultrasound;Dry needling;Manual techniques;Therapeutic activities;Therapeutic exercise;Functional mobility training;Stair training;Gait training;Vasopneumatic Device;Taping   PT Next Visit Plan review HEP, modalities PRN, assess response to ionto, continue hip and knee strengthening   Consulted and Agree with Plan of Care Patient      Patient will benefit from skilled therapeutic intervention in order to improve the following deficits and impairments:  Decreased mobility, Decreased strength, Pain, Increased fascial restricitons  Visit Diagnosis: Acute pain of left knee - Plan: PT plan of care cert/re-cert  Muscle weakness (generalized) - Plan: PT plan of care cert/re-cert     Problem List Patient Active Problem List   Diagnosis Date Noted  . Left knee pain 04/28/2016  . Acute medial meniscus tear 04/23/2016  . Paresthesia 04/03/2016  . Facet hypertrophy of lumbosacral region 04/03/2016  . Chronic back pain 04/03/2016  . Right knee pain 02/19/2016  . Rosacea 02/04/2016  . Vaginal atrophy 01/31/2016  . Idiopathic scoliosis 01/22/2016  . Hip bursitis 12/25/2015  . Biceps tendonitis on right 12/25/2015  . Diverticulitis of colon 12/21/2015  . GERD (gastroesophageal reflux disease) 11/27/2015  . Osteoporosis 11/27/2015  . GAD (generalized anxiety disorder) 11/27/2015  . MDD (major depressive disorder), recurrent, in full remission ([place]) 11/27/2015  . Chronic dryness of both eyes 11/27/2015  . Hypothyroidism 11/27/2015  . Asthma, chronic 11/27/2015  . Insomnia 11/27/2015  . Seborrheic keratoses 11/27/2015      Laureen Abrahams, PT, DPT 05/02/16 11:19 AM     [facility], [place], [address] Phone: [phone]   Fax:  [phone]  Name: Leslie Roberson MRN:  [id number] Date of Birth: 08/16/1955

## 2016-05-02 NOTE — Patient Instructions (Addendum)
Straight Leg Raise: With External Leg Rotation    Lie on back with right leg straight, opposite leg bent. Rotate straight leg out and lift __6-8__ inches. Repeat __10__ times per set. Do _1___ sets per session. Do __2-3__ sessions per day.  http://orth.exer.us/729   Copyright  VHI. All rights reserved.    HIP: Abduction - Side-Lying    Lie on side, legs straight and in line with trunk. Squeeze glutes. Raise top leg up and slightly back. Point toes forward. _10__ reps per set, _2-3__ sets per day, _6-7_ days per week Bend bottom leg to stabilize pelvis.  Copyright  VHI. All rights reserved.    Outer Hip Stretch: Reclined IT Band Stretch (Strap)    Strap around opposite foot, pull across only as far as possible with shoulders on mat. Hold for __30__ seconds. Repeat __2-3__ times on left leg.  Perform 2-3 sessions per day  Copyright  VHI. All rights reserved.       IONTOPHORESIS PATIENT PRECAUTIONS & CONTRAINDICATIONS:  . Redness under one or both electrodes can occur.  This characterized by a uniform redness that usually disappears within 12 hours of treatment. . Small pinhead size blisters may result in response to the drug.  Contact your physician if the problem persists more than 24 hours. . On rare occasions, iontophoresis therapy can result in temporary skin reactions such as rash, inflammation, irritation or burns.  The skin reactions may be the result of individual sensitivity to the ionic solution used, the condition of the skin at the start of treatment, reaction to the materials in the electrodes, allergies or sensitivity to dexamethasone, or a poor connection between the patch and your skin.  Discontinue using iontophoresis if you have any of these reactions and report to your therapist. . Remove the Patch or electrodes if you have any undue sensation of pain or burning during the treatment and report discomfort to your therapist. . Tell your Therapist if you  have had known adverse reactions to the application of electrical current. . If using the Patch, the LED light will turn off when treatment is complete and the patch can be removed.  Approximate treatment time is 1-3 hours.  Remove the patch when light goes off or after 6 hours. . The Patch can be worn during normal activity, however excessive motion where the electrodes have been placed can cause poor contact between the skin and the electrode or uneven electrical current resulting in greater risk of skin irritation. Marland Kitchen Keep out of the reach of children.   . DO NOT use if you have a cardiac pacemaker or any other electrically sensitive implanted device. . DO NOT use if you have a known sensitivity to dexamethasone. . DO NOT use during Magnetic Resonance Imaging (MRI). . DO NOT use over broken or compromised skin (e.g. sunburn, cuts, or acne) due to the increased risk of skin reaction. . DO NOT SHAVE over the area to be treated:  To establish good contact between the Patch and the skin, excessive hair may be clipped. . DO NOT place the Patch or electrodes on or over your eyes, directly over your heart, or brain. . DO NOT reuse the Patch or electrodes as this may cause burns to occur.

## 2016-05-05 ENCOUNTER — Ambulatory Visit (INDEPENDENT_AMBULATORY_CARE_PROVIDER_SITE_OTHER): Payer: BLUE CROSS/BLUE SHIELD | Admitting: Physical Therapy

## 2016-05-05 DIAGNOSIS — M6281 Muscle weakness (generalized): Secondary | ICD-10-CM

## 2016-05-05 DIAGNOSIS — M25562 Pain in left knee: Secondary | ICD-10-CM | POA: Diagnosis not present

## 2016-05-05 NOTE — Patient Instructions (Signed)
KNEE: Quadriceps - Prone    Place strap around ankle. Bring ankle toward buttocks. Press hip into surface. Hold _30__ seconds. _2-3__ reps per set, __1-2_ sets per day,   [facility] [place], [place] [address]  [phone] (office) [phone] (fax)

## 2016-05-05 NOTE — Therapy (Signed)
[place], [place], [address] Phone: [phone]   Fax:  [phone]  Physical Therapy Treatment  Patient Details  Name: Leslie Roberson MRN: [id number] Date of Birth: 1955-11-11 Referring Provider: Dr. Georgina Snell   Encounter Date: 05/05/2016      PT End of Session - 05/05/16 1119    Visit Number 2   Number of Visits 12   Date for PT Re-Evaluation 06/13/16   PT Start Time 1103   PT Stop Time 1154  cold pack last 12 min    PT Time Calculation (min) 51 min   Activity Tolerance Patient tolerated treatment well   Behavior During Therapy [facility] for tasks assessed/performed      Past Medical History:  Diagnosis Date  . Depression   . Idiopathic scoliosis 01/22/2016  . Thyroid disease     Past Surgical History:  Procedure Laterality Date  . ABDOMINAL HYSTERECTOMY    . bladder mesh    . ELBOW SURGERY Right   . SPINAL FUSION    . TONSILLECTOMY    . TOTAL HIP ARTHROPLASTY      There were no vitals filed for this visit.      Subjective Assessment - 05/05/16 1106    Subjective [name] states her Lt knee pain is about the same. She feels the ionto patch helped a little bit.     Pertinent History Scoliosis; spinal fusion thoracic and lumbar in 1969 and 1970; chronic scapular and shoulder pain; lt hip replacement 1998; dislocated elbow ~6 years ago    Currently in Pain? Yes   Pain Score 7    Pain Location Knee   Pain Orientation Left   Pain Descriptors / Indicators Aching;Jabbing   Aggravating Factors  walking    Pain Relieving Factors rest, sitting             [facility] PT Assessment - 05/05/16 0001      Assessment   Medical Diagnosis L knee pain   Referring Provider Dr. Georgina Snell    Onset Date/Surgical Date 04/21/16   Next MD Visit PRN          [facility] Adult PT Treatment/Exercise - 05/05/16 0001      Lumbar Exercises: Stretches   Quad Stretch 2 reps;30 seconds   Quad Stretch Limitations  prone; switched to sidelying for increased comfort in back/neck.      Knee/Hip Exercises: Stretches   Passive Hamstring Stretch Left;2 reps;30 seconds;Right   ITB Stretch Left;30 seconds;2 reps;Right  supine with strap     Knee/Hip Exercises: Supine   Straight Leg Raise with External Rotation Left;10 reps;2 sets     Knee/Hip Exercises: Sidelying   Hip ABduction Left;10 reps;2 sets  intermittent rest breaks,as needed     Modalities   Modalities Iontophoresis;Cryotherapy     Cryotherapy   Number Minutes Cryotherapy 12 Minutes   Cryotherapy Location Knee  Lt/Rt   Type of Cryotherapy Ice pack     Iontophoresis   Type of Iontophoresis Dexamethasone   Location L lateral knee   Dose 1.0 cc   Time 6 hour patch, 80 mA.      Manual Therapy   Soft tissue mobilization Lt lateral hip/TFL/ITB   Myofascial Release Lt lateral hip                 PT Education - 05/05/16 1148    Education provided Yes   Education Details HEP - added hamstring and quad stretch.   Use  of ice to Lt knee as needed.    Person(s) Educated Patient   Methods Explanation;Handout   Comprehension Verbalized understanding             PT Long Term Goals - 05/05/16 1152      PT LONG TERM GOAL #1   Title independent with HEP (06/13/16)   Time 6   Period Weeks   Status On-going     PT LONG TERM GOAL #2   Title improve L hip abduction strength to 4/5 for improved activity tolerance (06/13/16)   Time 6   Period Weeks   Status On-going     PT LONG TERM GOAL #3   Title report ability to walk > 15 min without increase in pain for improved activity tolerance and decreased pain (06/13/16)   Time 6   Period Weeks   Status Achieved     PT LONG TERM GOAL #4   Title improve FOTO to </= 48% limited (06/13/16)   Time 6   Period Weeks   Status On-going               Plan - 05/05/16 1253    Clinical Impression Statement Pt fatigues quickly with hip abduction exercise in supine.  She tolerated  all other exercises well, as well as last ionto treatment.  She was point tender along lateral Lt thigh and knee.    Pt reported some decrease in pain with use of ice pack at end of session.    Rehab Potential Good   PT Frequency 2x / week   PT Duration 6 weeks   PT Treatment/Interventions Patient/family education;ADLs/Self Care Home Management;Neuromuscular re-education;Cryotherapy;Electrical Stimulation;Iontophoresis 4mg /ml Dexamethasone;Moist Heat;Ultrasound;Dry needling;Manual techniques;Therapeutic activities;Therapeutic exercise;Functional mobility training;Stair training;Gait training;Vasopneumatic Device;Taping   PT Next Visit Plan continue hip and knee strengthening   Consulted and Agree with Plan of Care Patient      Patient will benefit from skilled therapeutic intervention in order to improve the following deficits and impairments:  Decreased mobility, Decreased strength, Pain, Increased fascial restricitons  Visit Diagnosis: Acute pain of left knee  Muscle weakness (generalized)     Problem List Patient Active Problem List   Diagnosis Date Noted  . Left knee pain 04/28/2016  . Acute medial meniscus tear 04/23/2016  . Paresthesia 04/03/2016  . Facet hypertrophy of lumbosacral region 04/03/2016  . Chronic back pain 04/03/2016  . Right knee pain 02/19/2016  . Rosacea 02/04/2016  . Vaginal atrophy 01/31/2016  . Idiopathic scoliosis 01/22/2016  . Hip bursitis 12/25/2015  . Biceps tendonitis on right 12/25/2015  . Diverticulitis of colon 12/21/2015  . GERD (gastroesophageal reflux disease) 11/27/2015  . Osteoporosis 11/27/2015  . GAD (generalized anxiety disorder) 11/27/2015  . MDD (major depressive disorder), recurrent, in full remission ([place]) 11/27/2015  . Chronic dryness of both eyes 11/27/2015  . Hypothyroidism 11/27/2015  . Asthma, chronic 11/27/2015  . Insomnia 11/27/2015  . Seborrheic keratoses 11/27/2015   Kerin Perna, PTA 05/05/16 12:56  PM  [facility] [place], [place], [address] Phone: [phone]   Fax:  [phone]  Name: Leslie Roberson MRN: [id number] Date of Birth: 1956-01-02

## 2016-05-08 ENCOUNTER — Encounter: Payer: Self-pay | Admitting: Physical Therapy

## 2016-05-12 ENCOUNTER — Encounter: Payer: Self-pay | Admitting: Physician Assistant

## 2016-05-12 ENCOUNTER — Ambulatory Visit (INDEPENDENT_AMBULATORY_CARE_PROVIDER_SITE_OTHER): Payer: BLUE CROSS/BLUE SHIELD | Admitting: Physician Assistant

## 2016-05-12 VITALS — BP 122/80 | HR 90 | Temp 97.8°F | Ht 59.0 in | Wt 123.9 lb

## 2016-05-12 DIAGNOSIS — J069 Acute upper respiratory infection, unspecified: Secondary | ICD-10-CM | POA: Diagnosis not present

## 2016-05-12 DIAGNOSIS — Z01818 Encounter for other preprocedural examination: Secondary | ICD-10-CM | POA: Diagnosis not present

## 2016-05-12 NOTE — Patient Instructions (Signed)
Upper Respiratory Infection, Adult Most upper respiratory infections (URIs) are caused by a virus. A URI affects the nose, throat, and upper air passages. The most common type of URI is often called "the common cold." Follow these instructions at home:  Take medicines only as told by your doctor.  Gargle warm saltwater or take cough drops to comfort your throat as told by your doctor.  Use a warm mist humidifier or inhale steam from a shower to increase air moisture. This may make it easier to breathe.  Drink enough fluid to keep your pee (urine) clear or pale yellow.  Eat soups and other clear broths.  Have a healthy diet.  Rest as needed.  Go back to work when your fever is gone or your doctor says it is okay.  You may need to stay home longer to avoid giving your URI to others.  You can also wear a face mask and wash your hands often to prevent spread of the virus.  Use your inhaler more if you have asthma.  Do not use any tobacco products, including cigarettes, chewing tobacco, or electronic cigarettes. If you need help quitting, ask your doctor. Contact a doctor if:  You are getting worse, not better.  Your symptoms are not helped by medicine.  You have chills.  You are getting more short of breath.  You have brown or red mucus.  You have yellow or brown discharge from your nose.  You have pain in your face, especially when you bend forward.  You have a fever.  You have puffy (swollen) neck glands.  You have pain while swallowing.  You have white areas in the back of your throat. Get help right away if:  You have very bad or constant:  Headache.  Ear pain.  Pain in your forehead, behind your eyes, and over your cheekbones (sinus pain).  Chest pain.  You have long-lasting (chronic) lung disease and any of the following:  Wheezing.  Long-lasting cough.  Coughing up blood.  A change in your usual mucus.  You have a stiff neck.  You have  changes in your:  Vision.  Hearing.  Thinking.  Mood. This information is not intended to replace advice given to you by your health care provider. Make sure you discuss any questions you have with your health care provider. Document Released: 09/03/2007 Document Revised: 11/18/2015 Document Reviewed: 06/22/2013 Elsevier Interactive Patient Education  2017 Elsevier Inc.  

## 2016-05-12 NOTE — Progress Notes (Addendum)
Subjective:     Patient ID: Leslie Roberson, female   DOB: 03-02-1956, 61 y.o.   MRN: [id number]  HPI Patient presents today for surgical clearance. Patient is having arthroscopic surgery for torn meniscus in right knee which will be scheduled after clearance is given.   She also complains of headaches, congestion, sore throat, and fatigue x 2 days. She reports post-nasal drip with ocassional cough. She has taken Mucinex once last night and flunisolide nasal spray. She denies any known exposure to influenza, but states her husband had cold like symptoms last week.   Review of Systems  Constitutional: Positive for fatigue. Negative for appetite change, chills and fever.  HENT: Positive for congestion, postnasal drip, rhinorrhea, sinus pressure and sore throat. Negative for ear pain and trouble swallowing.   Respiratory: Positive for cough. Negative for shortness of breath and wheezing.   Cardiovascular: Negative for chest pain, palpitations and leg swelling.  Gastrointestinal: Negative for abdominal pain, diarrhea, nausea and vomiting.  Musculoskeletal: Negative for arthralgias, myalgias and neck stiffness.  Neurological: Negative for dizziness, weakness and light-headedness.  Psychiatric/Behavioral: Negative for confusion and dysphoric mood. The patient is not nervous/anxious.        Objective:   Physical Exam  Constitutional: She is oriented to person, place, and time. She appears well-developed and well-nourished.  HENT:  Head: Normocephalic and atraumatic.  Right Ear: External ear normal.  Left Ear: External ear normal.  Mouth/Throat: Oropharynx is clear and moist. No oropharyngeal exudate.  Bilateral nares are erythematous and swollen.  Eyes: Conjunctivae are normal. Right eye exhibits no discharge. Left eye exhibits no discharge.  Neck: Normal range of motion. Neck supple.  Cardiovascular: Normal rate and regular rhythm.  Exam reveals no gallop and no friction rub.   No murmur  heard. Pulmonary/Chest: Effort normal and breath sounds normal. She has no wheezes. She has no rales.  Abdominal: Soft. Bowel sounds are normal. There is no tenderness.  Lymphadenopathy:    She has no cervical adenopathy.  Neurological: She is alert and oriented to person, place, and time.  Skin: Skin is warm and dry. No rash noted.  Psychiatric: She has a normal mood and affect. Her behavior is normal.       Assessment/Plan:     [name] was seen today for surgical clearance.  Diagnoses and all orders for this visit:  Pre-op evaluation -     EKG 12-Lead -     COMPLETE METABOLIC PANEL WITH GFR -     CBC with Differential/Platelet  Acute upper respiratory infection  Given acute onset of symptoms and duration of 2 days, educated patient on likelihood of viral infection vs bacterial infection. Encouraged supportive care including Mucinex and continuation of Flonase nasal spray as needed for congestion. Patient to call back in 2-3 days if condition is worsening or showing no improvement and antibiotic will be sent to pharmacy since she is going to see her pregnant daughter this weekend.   Instructed patient to postpone surgery if still acutely ill on day of surgery.   EKG NSR, no specific changes, no arrhthymias, inverted p wave and QRS in AVR and V1. No EKG concerns.  Pending normal labs. Will give surgical clearance.

## 2016-05-13 ENCOUNTER — Encounter: Payer: BLUE CROSS/BLUE SHIELD | Admitting: Rehabilitative and Restorative Service Providers"

## 2016-05-13 ENCOUNTER — Encounter: Payer: Self-pay | Admitting: Physician Assistant

## 2016-05-13 LAB — COMPLETE METABOLIC PANEL WITH GFR
ALT: 33 U/L — AB (ref 6–29)
AST: 25 U/L (ref 10–35)
Albumin: 4.3 g/dL (ref 3.6–5.1)
Alkaline Phosphatase: 75 U/L (ref 33–130)
BUN: 20 mg/dL (ref 7–25)
CHLORIDE: 103 mmol/L (ref 98–110)
CO2: 29 mmol/L (ref 20–31)
CREATININE: 0.87 mg/dL (ref 0.50–0.99)
Calcium: 9.6 mg/dL (ref 8.6–10.4)
GFR, EST AFRICAN AMERICAN: 84 mL/min (ref >=60)
GFR, Est Non African American: 73 mL/min (ref >=60)
Glucose, Bld: 100 mg/dL — ABNORMAL HIGH (ref 65–99)
Potassium: 4.4 mmol/L (ref 3.5–5.3)
SODIUM: 142 mmol/L (ref 135–146)
Total Bilirubin: 0.3 mg/dL (ref 0.2–1.2)
Total Protein: 6.9 g/dL (ref 6.1–8.1)

## 2016-05-13 LAB — CBC WITH DIFFERENTIAL/PLATELET
BASOS PCT: 1 %
Basophils Absolute: 58 cells/uL (ref 0–200)
Eosinophils Absolute: 58 cells/uL (ref 15–500)
Eosinophils Relative: 1 %
HCT: 42.6 % (ref 35.0–45.0)
Hemoglobin: 14.2 g/dL (ref 11.7–15.5)
LYMPHS PCT: 28 %
Lymphs Abs: 1624 cells/uL (ref 850–3900)
MCH: 30 pg (ref 27.0–33.0)
MCHC: 33.3 g/dL (ref 32.0–36.0)
MCV: 90.1 fL (ref 80.0–100.0)
MPV: 11.2 fL (ref 7.5–12.5)
Monocytes Absolute: 406 cells/uL (ref 200–950)
Monocytes Relative: 7 %
Neutro Abs: 3654 cells/uL (ref 1500–7800)
Neutrophils Relative %: 63 %
Platelets: 235 10*3/uL (ref 140–400)
RBC: 4.73 MIL/uL (ref 3.80–5.10)
RDW: 14.8 % (ref 11.0–15.0)
WBC: 5.8 10*3/uL (ref 3.8–10.8)

## 2016-05-13 MED ORDER — AMOXICILLIN-POT CLAVULANATE 875-125 MG PO TABS: 1.0000 | ORAL_TABLET | Freq: Two times a day (BID) | ORAL | 0 refills | Status: DC

## 2016-05-13 NOTE — Progress Notes (Signed)
Viruses can take a few days to run course as well. Tylenol cold sinus severe OTC can be very helpful. I will go ahead and send antibiotic to fill if continue to get worse since you have a trip this weekend.

## 2016-05-13 NOTE — Progress Notes (Signed)
Call pt: cmp good. Hgb good. EKG good. Cleared for surgery with fax to [facility].

## 2016-05-13 NOTE — Telephone Encounter (Signed)
Pt continues to feel worse and going to see pregnant daughter this weekend. I will send augmentin to take if continues to feel worse. Start taking tylenol cold sinus severe.

## 2016-05-14 ENCOUNTER — Encounter: Payer: Self-pay | Admitting: Physical Therapy

## 2016-05-14 ENCOUNTER — Other Ambulatory Visit: Payer: Self-pay | Admitting: Physician Assistant

## 2016-05-15 ENCOUNTER — Encounter: Payer: Self-pay | Admitting: Physical Therapy

## 2016-05-16 ENCOUNTER — Other Ambulatory Visit: Payer: Self-pay | Admitting: Physician Assistant

## 2016-05-21 ENCOUNTER — Ambulatory Visit (INDEPENDENT_AMBULATORY_CARE_PROVIDER_SITE_OTHER): Payer: BLUE CROSS/BLUE SHIELD | Admitting: Physical Therapy

## 2016-05-21 DIAGNOSIS — M25562 Pain in left knee: Secondary | ICD-10-CM | POA: Diagnosis not present

## 2016-05-21 DIAGNOSIS — M6281 Muscle weakness (generalized): Secondary | ICD-10-CM

## 2016-05-21 NOTE — Therapy (Signed)
[place], [place], [address] Phone: [phone]   Fax:  [phone]  Physical Therapy Treatment  Patient Details  Name: Leslie Roberson MRN: [id number] Date of Birth: 12-18-1955 Referring Provider: Dr. Georgina Snell   Encounter Date: 05/21/2016      PT End of Session - 05/21/16 1140    Visit Number 3   Number of Visits 12   Date for PT Re-Evaluation 06/13/16   PT Start Time 1105   PT Stop Time 1150   PT Time Calculation (min) 45 min   Activity Tolerance Patient tolerated treatment well   Behavior During Therapy [facility] for tasks assessed/performed      Past Medical History:  Diagnosis Date  . Depression   . Idiopathic scoliosis 01/22/2016  . Thyroid disease     Past Surgical History:  Procedure Laterality Date  . ABDOMINAL HYSTERECTOMY    . bladder mesh    . ELBOW SURGERY Right   . SPINAL FUSION    . TONSILLECTOMY    . TOTAL HIP ARTHROPLASTY      There were no vitals filed for this visit.      Subjective Assessment - 05/21/16 1106    Subjective Lt knee has been "okay" and reports no pain.  Currently having some R hip discomfort.   Pertinent History Scoliosis; spinal fusion thoracic and lumbar in 1969 and 1970; chronic scapular and shoulder pain; lt hip replacement 1998; dislocated elbow ~6 years ago    Patient Stated Goals improve pain   Currently in Pain? No/denies  c/o 3/10 R hip pain                         [facility] Adult PT Treatment/Exercise - 05/21/16 1108      Exercises   Exercises Knee/Hip     Knee/Hip Exercises: Stretches   ITB Stretch Both;3 reps;30 seconds   ITB Stretch Limitations supine with strap     Knee/Hip Exercises: Aerobic   Nustep L3 x 6 min     Knee/Hip Exercises: Supine   Bridges 10 reps   Straight Leg Raises Both;10 reps   Straight Leg Raises Limitations 2#   Straight Leg Raise with External Rotation Both;10 reps   Straight Leg Raise  with External Rotation Limitations 2#   Other Supine Knee/Hip Exercises single limb clamshells in hooklying with green theraband x 10 bil     Cryotherapy   Number Minutes Cryotherapy 10 Minutes   Cryotherapy Location Knee   Type of Cryotherapy Ice pack     Iontophoresis   Type of Iontophoresis Dexamethasone   Location L lateral knee   Dose 1.0 cc   Time 6 hour patch, 80 mA.                      PT Long Term Goals - 05/05/16 1152      PT LONG TERM GOAL #1   Title independent with HEP (06/13/16)   Time 6   Period Weeks   Status On-going     PT LONG TERM GOAL #2   Title improve L hip abduction strength to 4/5 for improved activity tolerance (06/13/16)   Time 6   Period Weeks   Status On-going     PT LONG TERM GOAL #3   Title report ability to walk > 15 min without increase in pain for improved activity tolerance and decreased pain (06/13/16)   Time 6  Period Weeks   Status Achieved     PT LONG TERM GOAL #4   Title improve FOTO to </= 48% limited (06/13/16)   Time 6   Period Weeks   Status On-going               Plan - 05/21/16 1140    Clinical Impression Statement Pt reports no increase in L knee pain and tolerated trip to [facility] without increase in pain.  Has new onset of R hip pain so performed exercises bil today.  Will continue to benefit from PT to maximize function.   PT Treatment/Interventions Patient/family education;ADLs/Self Care Home Management;Neuromuscular re-education;Cryotherapy;Electrical Stimulation;Iontophoresis 4mg /ml Dexamethasone;Moist Heat;Ultrasound;Dry needling;Manual techniques;Therapeutic activities;Therapeutic exercise;Functional mobility training;Stair training;Gait training;Vasopneumatic Device;Taping   PT Next Visit Plan continue hip and knee strengthening, try no ionto and see how pt does      Patient will benefit from skilled therapeutic intervention in order to improve the following deficits and impairments:  Decreased  mobility, Decreased strength, Pain, Increased fascial restricitons  Visit Diagnosis: Acute pain of left knee  Muscle weakness (generalized)     Problem List Patient Active Problem List   Diagnosis Date Noted  . Left knee pain 04/28/2016  . Acute medial meniscus tear 04/23/2016  . Paresthesia 04/03/2016  . Facet hypertrophy of lumbosacral region 04/03/2016  . Chronic back pain 04/03/2016  . Right knee pain 02/19/2016  . Rosacea 02/04/2016  . Vaginal atrophy 01/31/2016  . Idiopathic scoliosis 01/22/2016  . Hip bursitis 12/25/2015  . Biceps tendonitis on right 12/25/2015  . Diverticulitis of colon 12/21/2015  . GERD (gastroesophageal reflux disease) 11/27/2015  . Osteoporosis 11/27/2015  . GAD (generalized anxiety disorder) 11/27/2015  . MDD (major depressive disorder), recurrent, in full remission ([place]) 11/27/2015  . Chronic dryness of both eyes 11/27/2015  . Hypothyroidism 11/27/2015  . Asthma, chronic 11/27/2015  . Insomnia 11/27/2015  . Seborrheic keratoses 11/27/2015      Laureen Abrahams, PT, DPT 05/21/16 11:44 AM   [facility] [place], [place], [address] Phone: [phone]   Fax:  [phone]  Name: Angeleigh Wurth MRN: [id number] Date of Birth: 08-28-1955

## 2016-05-27 ENCOUNTER — Telehealth: Payer: Self-pay

## 2016-05-27 ENCOUNTER — Ambulatory Visit (INDEPENDENT_AMBULATORY_CARE_PROVIDER_SITE_OTHER): Payer: BLUE CROSS/BLUE SHIELD | Admitting: Rehabilitative and Restorative Service Providers"

## 2016-05-27 ENCOUNTER — Encounter: Payer: Self-pay | Admitting: Rehabilitative and Restorative Service Providers"

## 2016-05-27 DIAGNOSIS — R531 Weakness: Secondary | ICD-10-CM | POA: Diagnosis not present

## 2016-05-27 DIAGNOSIS — M25562 Pain in left knee: Secondary | ICD-10-CM | POA: Diagnosis not present

## 2016-05-27 DIAGNOSIS — M25561 Pain in right knee: Secondary | ICD-10-CM | POA: Diagnosis not present

## 2016-05-27 NOTE — Therapy (Signed)
[name], [place], [address] Phone: [phone]   Fax:  [phone]  Physical Therapy Treatment  Patient Details  Name: Leslie Roberson MRN: [id number] Date of Birth: 09-Feb-1956 Referring Provider: Dr Georgina Snell   Encounter Date: 05/27/2016      PT End of Session - 05/27/16 1111    Visit Number 4   Number of Visits 12   Date for PT Re-Evaluation 06/13/16   PT Start Time 1106   PT Stop Time 1145   PT Time Calculation (min) 39 min   Activity Tolerance Patient tolerated treatment well      Past Medical History:  Diagnosis Date  . Depression   . Idiopathic scoliosis 01/22/2016  . Thyroid disease     Past Surgical History:  Procedure Laterality Date  . ABDOMINAL HYSTERECTOMY    . bladder mesh    . ELBOW SURGERY Right   . SPINAL FUSION    . TONSILLECTOMY    . TOTAL HIP ARTHROPLASTY      There were no vitals filed for this visit.      Subjective Assessment - 05/27/16 1115    Subjective Patient reports that that the knee is doing OK. It is her Rt knee that bothers her the most. Will be having surgery for torn meniscus 06/06/16. Continues to have some Rt hip pain.    Currently in Pain? No/denies   Pain Location Knee   Pain Orientation Right   Pain Descriptors / Indicators Aching   Pain Type Acute pain   Pain Onset More than a month ago   Pain Frequency Intermittent            [facility] PT Assessment - 05/27/16 0001      Assessment   Medical Diagnosis Rt/Lt knee pain   Referring Provider Dr Georgina Snell    Onset Date/Surgical Date 04/21/16  scheduled for sx Rt knee 06/06/16   Next MD Visit PRN     AROM   Right Knee Extension -1   Right Knee Flexion 137   Left Knee Extension -1   Left Knee Flexion 135     Strength   Right Hip Flexion 4-/5   Right Hip Extension 5/5   Right Hip ABduction 4/5   Right Hip ADduction 5/5   Left Hip Flexion 4-/5   Left Hip Extension 5/5   Left Hip  ABduction 4-/5   Left Hip ADduction 5/5   Right Knee Flexion 4+/5   Right Knee Extension 4/5   Left Knee Flexion 4+/5   Left Knee Extension 4/5                     [facility] Adult PT Treatment/Exercise - 05/27/16 0001      Knee/Hip Exercises: Stretches   Passive Hamstring Stretch Right;Left;3 reps;30 seconds   Passive Hamstring Stretch Limitations supine w/ strap    ITB Stretch Both;3 reps;30 seconds   ITB Stretch Limitations supine with strap     Knee/Hip Exercises: Standing   Hip Abduction Right;Left;2 sets;10 reps;Knee straight  green TB above knees   Hip Extension Right;Left;Stengthening;2 sets;10 reps;Knee straight  TB above knees      Knee/Hip Exercises: Supine   Quad Sets Strengthening;Right;10 reps  5 sec x 10    Bridges 10 reps   Straight Leg Raises Both;10 reps   Straight Leg Raises Limitations 3#   Straight Leg Raise with External Rotation Both;10 reps   Straight Leg Raise with External  Rotation Limitations 3#   Other Supine Knee/Hip Exercises single limb clamshells in hooklying with green theraband x 10 bil     Knee/Hip Exercises: Sidelying   Hip ADduction Strengthening;Right;Left;10 reps     Iontophoresis   Type of Iontophoresis Dexamethasone   Location Rt lateral knee   Dose 120 mAmp   Time 8 hr                PT Education - 05/27/16 1142    Education provided Yes   Education Details HEP    Person(s) Educated Patient   Methods Explanation;Demonstration;Tactile cues;Verbal cues   Comprehension Verbalized understanding;Returned demonstration;Verbal cues required;Tactile cues required             PT Long Term Goals - 05/27/16 1114      PT LONG TERM GOAL #1   Title independent with HEP (06/13/16)   Time 6   Period Weeks   Status On-going     PT LONG TERM GOAL #2   Title improve L hip abduction strength to 4/5 for improved activity tolerance (06/13/16)   Time 6   Period Weeks   Status On-going     PT LONG TERM GOAL #3    Title report ability to walk > 15 min without increase in pain for improved activity tolerance and decreased pain (06/13/16)   Time 6   Period Weeks   Status Achieved     PT LONG TERM GOAL #4   Title improve FOTO to </= 48% limited (06/13/16)   Time 6   Period Weeks   Status On-going               Plan - 05/27/16 1147    Clinical Impression Statement Pt reports that Rt knee has some pain on an intermittent basis. Lt knee is OK. She is scheduled for Rt knee surgery for meniscus 06/06/16. Patient tolerated progression of exercises well. Continues to benefit from ionto. Note some increase in strength bilat LE's. Progressing toward goals of therapy.    Rehab Potential Good   PT Frequency 2x / week   PT Duration 6 weeks   PT Treatment/Interventions Patient/family education;ADLs/Self Care Home Management;Neuromuscular re-education;Cryotherapy;Electrical Stimulation;Iontophoresis 4mg /ml Dexamethasone;Moist Heat;Ultrasound;Dry needling;Manual techniques;Therapeutic activities;Therapeutic exercise;Functional mobility training;Stair training;Gait training;Vasopneumatic Device;Taping   PT Next Visit Plan continue hip and knee strengthening, ionto    Consulted and Agree with Plan of Care Patient      Patient will benefit from skilled therapeutic intervention in order to improve the following deficits and impairments:  Decreased mobility, Decreased strength, Pain, Increased fascial restricitons  Visit Diagnosis: Acute pain of left knee  Acute pain of right knee  Weakness generalized     Problem List Patient Active Problem List   Diagnosis Date Noted  . Left knee pain 04/28/2016  . Acute medial meniscus tear 04/23/2016  . Paresthesia 04/03/2016  . Facet hypertrophy of lumbosacral region 04/03/2016  . Chronic back pain 04/03/2016  . Right knee pain 02/19/2016  . Rosacea 02/04/2016  . Vaginal atrophy 01/31/2016  . Idiopathic scoliosis 01/22/2016  . Hip bursitis 12/25/2015  .  Biceps tendonitis on right 12/25/2015  . Diverticulitis of colon 12/21/2015  . GERD (gastroesophageal reflux disease) 11/27/2015  . Osteoporosis 11/27/2015  . GAD (generalized anxiety disorder) 11/27/2015  . MDD (major depressive disorder), recurrent, in full remission ([place]) 11/27/2015  . Chronic dryness of both eyes 11/27/2015  . Hypothyroidism 11/27/2015  . Asthma, chronic 11/27/2015  . Insomnia 11/27/2015  . Seborrheic keratoses 11/27/2015  [place], MPH  05/27/2016, 11:52 AM  [facility] [place], [place], [address] Phone: [phone]   Fax:  [phone]  Name: Aila Sagel MRN: [id number] Date of Birth: 01-07-1956

## 2016-05-27 NOTE — Patient Instructions (Signed)
Patient has handouts   sidelying hip abduction 10 bilat   Standing hip extension and hip abduction 10 x 2 green TB bilat

## 2016-05-27 NOTE — Telephone Encounter (Signed)
Pt left a VM stating that the pain clinic needs additional information related to the referral advising that they have not received any records from our office. Pt advised that she been scheduled for an appointment. Pt requests a call to further assist her with getting referral information to the pain clinic.

## 2016-05-30 ENCOUNTER — Ambulatory Visit (INDEPENDENT_AMBULATORY_CARE_PROVIDER_SITE_OTHER): Payer: BLUE CROSS/BLUE SHIELD | Admitting: Rehabilitation

## 2016-05-30 ENCOUNTER — Encounter: Payer: Self-pay | Admitting: Rehabilitation

## 2016-05-30 DIAGNOSIS — R531 Weakness: Secondary | ICD-10-CM

## 2016-05-30 DIAGNOSIS — M25562 Pain in left knee: Secondary | ICD-10-CM

## 2016-05-30 DIAGNOSIS — M25561 Pain in right knee: Secondary | ICD-10-CM

## 2016-05-30 NOTE — Therapy (Signed)
[place], [place], [address] Phone: [phone]   Fax:  [phone]  Physical Therapy Treatment  Patient Details  Name: Leslie Roberson MRN: [id number] Date of Birth: 1955/04/22 Referring Provider: Dr Georgina Snell   Encounter Date: 05/30/2016      PT End of Session - 05/30/16 1113    Visit Number 5   Number of Visits 12   Date for PT Re-Evaluation 06/13/16   PT Start Time 1100   PT Stop Time 1140   PT Time Calculation (min) 40 min   Equipment Utilized During Treatment Gait belt   Activity Tolerance Patient tolerated treatment well      Past Medical History:  Diagnosis Date  . Depression   . Idiopathic scoliosis 01/22/2016  . Thyroid disease     Past Surgical History:  Procedure Laterality Date  . ABDOMINAL HYSTERECTOMY    . bladder mesh    . ELBOW SURGERY Right   . SPINAL FUSION    . TONSILLECTOMY    . TOTAL HIP ARTHROPLASTY      There were no vitals filed for this visit.      Subjective Assessment - 05/30/16 1054    Subjective Reports everything is just doing okay.                           [place] Adult PT Treatment/Exercise - 05/30/16 0001      Knee/Hip Exercises: Stretches   Passive Hamstring Stretch Right;Left;3 reps;30 seconds   Passive Hamstring Stretch Limitations supine w/ strap    ITB Stretch Both;3 reps;30 seconds   ITB Stretch Limitations supine with strap     Knee/Hip Exercises: Aerobic   Nustep L3 x 6 min     Knee/Hip Exercises: Machines for Strengthening   Cybex Leg Press 20# x 15 bil     Knee/Hip Exercises: Standing   Walking with Sports Cord side steps green band 2x15each     Knee/Hip Exercises: Supine   Bridges 10 reps   Straight Leg Raises Both;10 reps;2 sets   Straight Leg Raises Limitations 3#   Straight Leg Raise with External Rotation Both;10 reps   Straight Leg Raise with External Rotation Limitations 3#   Other Supine Knee/Hip  Exercises single limb clamshells in hooklying with green theraband x 10 bil     Knee/Hip Exercises: Sidelying   Hip ABduction Both;1 set;15 reps   Hip ABduction Limitations 3#     Iontophoresis   Type of Iontophoresis Dexamethasone   Location Rt lateral knee   Dose 120 mAmp   Time 8 hr                     PT Long Term Goals - 05/27/16 1114      PT LONG TERM GOAL #1   Title independent with HEP (06/13/16)   Time 6   Period Weeks   Status On-going     PT LONG TERM GOAL #2   Title improve L hip abduction strength to 4/5 for improved activity tolerance (06/13/16)   Time 6   Period Weeks   Status On-going     PT LONG TERM GOAL #3   Title report ability to walk > 15 min without increase in pain for improved activity tolerance and decreased pain (06/13/16)   Time 6   Period Weeks   Status Achieved     PT LONG TERM GOAL #4   Title  improve FOTO to </= 48% limited (06/13/16)   Time 6   Period Weeks   Status On-going               Plan - 05/30/16 1114    Clinical Impression Statement Continues to strengthen well.  Ready for surgery.  Progressing towards goals.     Rehab Potential Good   PT Frequency 2x / week   PT Duration 6 weeks   PT Treatment/Interventions Patient/family education;ADLs/Self Care Home Management;Neuromuscular re-education;Cryotherapy;Electrical Stimulation;Iontophoresis 4mg /ml Dexamethasone;Moist Heat;Ultrasound;Dry needling;Manual techniques;Therapeutic activities;Therapeutic exercise;Functional mobility training;Stair training;Gait training;Vasopneumatic Device;Taping   PT Next Visit Plan continue hip and knee strengthening, ionto    Consulted and Agree with Plan of Care Patient      Patient will benefit from skilled therapeutic intervention in order to improve the following deficits and impairments:  Decreased mobility, Decreased strength, Pain, Increased fascial restricitons  Visit Diagnosis: Acute pain of left knee  Acute pain of  right knee  Weakness generalized     Problem List Patient Active Problem List   Diagnosis Date Noted  . Left knee pain 04/28/2016  . Acute medial meniscus tear 04/23/2016  . Paresthesia 04/03/2016  . Facet hypertrophy of lumbosacral region 04/03/2016  . Chronic back pain 04/03/2016  . Right knee pain 02/19/2016  . Rosacea 02/04/2016  . Vaginal atrophy 01/31/2016  . Idiopathic scoliosis 01/22/2016  . Hip bursitis 12/25/2015  . Biceps tendonitis on right 12/25/2015  . Diverticulitis of colon 12/21/2015  . GERD (gastroesophageal reflux disease) 11/27/2015  . Osteoporosis 11/27/2015  . GAD (generalized anxiety disorder) 11/27/2015  . MDD (major depressive disorder), recurrent, in full remission ([place]) 11/27/2015  . Chronic dryness of both eyes 11/27/2015  . Hypothyroidism 11/27/2015  . Asthma, chronic 11/27/2015  . Insomnia 11/27/2015  . Seborrheic keratoses 11/27/2015    Stark Bray 05/30/2016, 11:52 AM  [facility] [place], [place], [address] Phone: [phone]   Fax:  [phone]  Name: Latusha Rizkallah MRN: [id number] Date of Birth: [birth year]

## 2016-05-31 ENCOUNTER — Other Ambulatory Visit: Payer: Self-pay | Admitting: Physician Assistant

## 2016-06-04 ENCOUNTER — Ambulatory Visit (INDEPENDENT_AMBULATORY_CARE_PROVIDER_SITE_OTHER): Payer: BLUE CROSS/BLUE SHIELD | Admitting: Osteopathic Medicine

## 2016-06-04 ENCOUNTER — Ambulatory Visit (INDEPENDENT_AMBULATORY_CARE_PROVIDER_SITE_OTHER): Payer: BLUE CROSS/BLUE SHIELD | Admitting: Physical Therapy

## 2016-06-04 ENCOUNTER — Encounter: Payer: Self-pay | Admitting: Osteopathic Medicine

## 2016-06-04 VITALS — BP 148/84 | HR 89 | Ht <= 58 in | Wt 125.0 lb

## 2016-06-04 DIAGNOSIS — M25562 Pain in left knee: Secondary | ICD-10-CM

## 2016-06-04 DIAGNOSIS — R531 Weakness: Secondary | ICD-10-CM

## 2016-06-04 DIAGNOSIS — L259 Unspecified contact dermatitis, unspecified cause: Secondary | ICD-10-CM | POA: Diagnosis not present

## 2016-06-04 DIAGNOSIS — M25561 Pain in right knee: Secondary | ICD-10-CM | POA: Diagnosis not present

## 2016-06-04 MED ORDER — TRIAMCINOLONE ACETONIDE 0.1 % EX OINT: 1.0000 "application " | TOPICAL_OINTMENT | Freq: Two times a day (BID) | CUTANEOUS | 0 refills | Status: DC

## 2016-06-04 NOTE — Progress Notes (Signed)
HPI: Leslie Roberson is a 61 y.o. female  who presents to [facility] today, 06/04/16,  for chief complaint of:  Chief Complaint  Patient presents with  . Rash    RIGHT KNEE    Rash on right knee, popped up last week after patch was in place at physical therapy. Has been using betamethasone cream which was old prescription which has been helping the rash has not totally resolved. It is itchy. It is not showing any ulceration or drainage. She has upcoming surgery for knee arthroscopy and is concerned that this might delay her surgery.    Past medical history, surgical history, social history and family history reviewed.    Current medication list and allergy/intolerance information reviewed.    Allergies  Allergen Reactions  . Adhesive [Tape]   . Demerol [Meperidine]   . Dilaudid [Hydromorphone]   . Levofloxacin       Review of Systems:  Constitutional: No recent illness  HEENT: No  headache  Cardiac: No  chest pain, No  pressure  Respiratory:  No  shortness of breath. No  Cough  Musculoskeletal: No new myalgia/arthralgia  Skin: +Rash  Exam:  BP (!) 148/84   Pulse 89   Ht 4\' 10"  (1.473 m)   Wt 125 lb (56.7 kg)   BMI 26.13 kg/m   Constitutional: VS see above. General Appearance: alert, well-developed, well-nourished, NAD  Eyes: Normal lids and conjunctive, non-icteric sclera  Neck: No masses, trachea midline.   Musculoskeletal: Gait normal. Symmetric and independent movement of all extremities.  Neurological: Normal balance/coordination. No tremor.  Psychiatric: Normal judgment/insight. Normal mood and affect. Oriented x3.          ASSESSMENT/PLAN: Appreciate assistance of Dr. Tommi Rumps helping [place] get in touch with the orthopedics team. Sounds like surgery will need to be delayed and patient should follow up with them. Patient was advised to call orthopedics if she does not hear back within the next day or 2 about  scheduling an appointment with him. Advised that this rash appears most likely contact dermatitis/urticaria due to known trigger from patch, however risk of infection is enough to delay surgery to avoid septic joint.   Contact dermatitis, unspecified contact dermatitis type, unspecified trigger - Plan: triamcinolone ointment (KENALOG) 0.1 %     Follow-up plan: Return for visit w/ Dr Noemi Chapel 06/10/16 morning - they will call you! Call us if no better/worse! .  Visit summary with medication list and pertinent instructions was printed for patient to review, alert [place] if any changes needed. All questions at time of visit were answered - patient instructed to contact office with any additional concerns. ER/RTC precautions were reviewed with the patient and understanding verbalized.

## 2016-06-04 NOTE — Patient Instructions (Signed)
Contact Dermatitis Dermatitis is redness, soreness, and swelling (inflammation) of the skin. Contact dermatitis is a reaction to certain substances that touch the skin. There are two types of contact dermatitis:  Irritant contact dermatitis. This type is caused by something that irritates your skin, such as dry hands from washing them too much. This type does not require previous exposure to the substance for a reaction to occur. This type is more common.  Allergic contact dermatitis. This type is caused by a substance that you are allergic to, such as a nickel allergy or poison ivy. This type only occurs if you have been exposed to the substance (allergen) before. Upon a repeat exposure, your body reacts to the substance. This type is less common. What are the causes? Many different substances can cause contact dermatitis. Irritant contact dermatitis is most commonly caused by exposure to:  Makeup.  Soaps.  Detergents.  Bleaches.  Acids.  Metal salts, such as nickel. Allergic contact dermatitis is most commonly caused by exposure to:  Poisonous plants.  Chemicals.  Jewelry.  Latex.  Medicines.  Preservatives in products, such as clothing. What increases the risk? This condition is more likely to develop in:  People who have jobs that expose them to irritants or allergens.  People who have certain medical conditions, such as asthma or eczema. What are the signs or symptoms? Symptoms of this condition may occur anywhere on your body where the irritant has touched you or is touched by you. Symptoms include:  Dryness or flaking.  Redness.  Cracks.  Itching.  Pain or a burning feeling.  Blisters.  Drainage of small amounts of blood or clear fluid from skin cracks. With allergic contact dermatitis, there may also be swelling in areas such as the eyelids, mouth, or genitals. How is this diagnosed? This condition is diagnosed with a medical history and physical exam.  A patch skin test may be performed to help determine the cause. If the condition is related to your job, you may need to see an occupational medicine specialist. How is this treated? Treatment for this condition includes figuring out what caused the reaction and protecting your skin from further contact. Treatment may also include:  Steroid creams or ointments. Oral steroid medicines may be needed in more severe cases.  Antibiotics or antibacterial ointments, if a skin infection is present.  Antihistamine lotion or an antihistamine taken by mouth to ease itching.  A bandage (dressing). Follow these instructions at home: [place] your skin as needed.  Apply cool compresses to the affected areas.  Try taking a bath with:  Epsom salts. Follow the instructions on the packaging. You can get these at your local pharmacy or grocery store.  Baking soda. Pour a small amount into the bath as directed by your health care provider.  Colloidal oatmeal. Follow the instructions on the packaging. You can get this at your local pharmacy or grocery store.  Try applying baking soda paste to your skin. Stir water into baking soda until it reaches a paste-like consistency.  Do not scratch your skin.  Bathe less frequently, such as every other day.  Bathe in lukewarm water. Avoid using hot water. Medicines   Take or apply over-the-counter and prescription medicines only as told by your health care provider.  If you were prescribed an antibiotic medicine, take or apply your antibiotic as told by your health care provider. Do not stop using the antibiotic even if your condition starts to improve. General  instructions   Keep all follow-up visits as told by your health care provider. This is important.  Avoid the substance that caused your reaction. If you do not know what caused it, keep a journal to try to track what caused it. Write down:  What you eat.  What cosmetic products  you use.  What you drink.  What you wear in the affected area. This includes jewelry.  If you were given a dressing, take care of it as told by your health care provider. This includes when to change and remove it. Contact a health care provider if:  Your condition does not improve with treatment.  Your condition gets worse.  You have signs of infection such as swelling, tenderness, redness, soreness, or warmth in the affected area.  You have a fever.  You have new symptoms. Get help right away if:  You have a severe headache, neck pain, or neck stiffness.  You vomit.  You feel very sleepy.  You notice red streaks coming from the affected area.  Your bone or joint underneath the affected area becomes painful after the skin has healed.  The affected area turns darker.  You have difficulty breathing. This information is not intended to replace advice given to you by your health care provider. Make sure you discuss any questions you have with your health care provider. Document Released: 03/14/2000 Document Revised: 08/23/2015 Document Reviewed: 08/02/2014 Elsevier Interactive Patient Education  2017 [employer].

## 2016-06-04 NOTE — Therapy (Addendum)
[facility] [place] [address] [place], [place], [address] Phone: [phone]   Fax:  [phone]  Physical Therapy Treatment  Patient Details  Name: Leslie Roberson MRN: [id number] Date of Birth: 1955/10/20 Referring Provider: Roderic Scarce   Encounter Date: 06/04/2016      PT End of Session - 06/04/16 1142    Visit Number 6   Number of Visits 12   Date for PT Re-Evaluation 06/13/16   PT Start Time 3818   PT Stop Time 1147   PT Time Calculation (min) 44 min   Activity Tolerance Patient tolerated treatment well;No increased pain   Behavior During Therapy WFL for tasks assessed/performed      Past Medical History:  Diagnosis Date  . Depression   . Idiopathic scoliosis 01/22/2016  . Thyroid disease     Past Surgical History:  Procedure Laterality Date  . ABDOMINAL HYSTERECTOMY    . bladder mesh    . ELBOW SURGERY Right   . SPINAL FUSION    . TONSILLECTOMY    . TOTAL HIP ARTHROPLASTY      There were no vitals filed for this visit.      Subjective Assessment - 06/04/16 1108    Subjective Pt reports she hasn't had any pain in Rt knee for 2 wks. Pt reports she had a reaction to the last ionto patch (redness the size of quarter noted).  "I hope my surgery isn't delayed because of this"    Patient Stated Goals improve pain   Currently in Pain? No/denies   Pain Score 0-No pain            [facility] PT Assessment - 06/04/16 0001      Assessment   Medical Diagnosis Rt/Lt knee pain   Referring Provider D. Corey    Onset Date/Surgical Date 04/21/16  scheduled for sx Rt knee 06/06/16   Next MD Visit PRN     Strength   Right Hip Flexion 5/5   Right Hip Extension 5/5   Right Hip ABduction --  5-/5   Left Hip Flexion --  5-/5   Left Hip Extension --  5-/5   Right Knee Flexion 5-/5  with pain   Right Knee Extension 5/5   Left Knee Flexion 5/5   Left Knee Extension 5/5    FOTO score: 53% limited.       [place]  Adult PT Treatment/Exercise - 06/04/16 0001      Knee/Hip Exercises: Stretches   Passive Hamstring Stretch Right;Left;3 reps;30 seconds   Quad Stretch Right;3 reps;30 seconds   ITB Stretch Both;3 reps;30 seconds   ITB Stretch Limitations supine with strap   Gastroc Stretch Right;2 reps     Knee/Hip Exercises: Aerobic   Nustep L5: 6 min (arms/legs)     Knee/Hip Exercises: Seated   Sit to Sand 10 reps;without UE support  eccentric, Lt foot forward     Knee/Hip Exercises: Supine   Bridges 10 reps   Straight Leg Raise with External Rotation Strengthening;Both;1 set;15 reps   Straight Leg Raise with External Rotation Limitations with hip abdct/add    Other Supine Knee/Hip Exercises single limb clamshells in hooklying with green theraband x 10 bil     Knee/Hip Exercises: Sidelying   Clams x 10 reps each side with green band; 2nd set on RLE.      Modalities   Modalities --  pt declined; pain free           PT Long  Term Goals - 06/04/16 1148      PT LONG TERM GOAL #1   Title independent with HEP (06/13/16)   Time 6   Period Weeks   Status On-going     PT LONG TERM GOAL #2   Title improve L hip abduction strength to 4/5 for improved activity tolerance (06/13/16)   Time 6   Period Weeks   Status Achieved     PT LONG TERM GOAL #3   Title report ability to walk > 15 min without increase in pain for improved activity tolerance and decreased pain (06/13/16)   Time 6   Period Weeks   Status Achieved     PT LONG TERM GOAL #4   Title improve FOTO to </= 48% limited (06/13/16)   Time 6   Period Weeks   Status On-going               Plan - 06/04/16 1143    Clinical Impression Statement Pt demonstrated improved hip/knee strength bilaterally; has met LTG #2 . FOTO score has improved to 53%; goal of 48%.  She has partially met her goals and is making good gains towards remaining.    Rehab Potential Good   PT Frequency 2x / week   PT Duration 6 weeks   PT  Treatment/Interventions Patient/family education;ADLs/Self Care Home Management;Neuromuscular re-education;Cryotherapy;Electrical Stimulation;Iontophoresis 26m/ml Dexamethasone;Moist Heat;Ultrasound;Dry needling;Manual techniques;Therapeutic activities;Therapeutic exercise;Functional mobility training;Stair training;Gait training;Vasopneumatic Device;Taping   PT Next Visit Plan Will hold therapy.  Pt awaiting word from MD if rash on leg will delay surgery.     Consulted and Agree with Plan of Care Patient      Patient will benefit from skilled therapeutic intervention in order to improve the following deficits and impairments:  Decreased mobility, Decreased strength, Pain, Increased fascial restricitons  Visit Diagnosis: Acute pain of left knee  Acute pain of right knee  Weakness generalized     Problem List Patient Active Problem List   Diagnosis Date Noted  . Left knee pain 04/28/2016  . Acute medial meniscus tear 04/23/2016  . Paresthesia 04/03/2016  . Facet hypertrophy of lumbosacral region 04/03/2016  . Chronic back pain 04/03/2016  . Right knee pain 02/19/2016  . Rosacea 02/04/2016  . Vaginal atrophy 01/31/2016  . Idiopathic scoliosis 01/22/2016  . Hip bursitis 12/25/2015  . Biceps tendonitis on right 12/25/2015  . Diverticulitis of colon 12/21/2015  . GERD (gastroesophageal reflux disease) 11/27/2015  . Osteoporosis 11/27/2015  . GAD (generalized anxiety disorder) 11/27/2015  . MDD (major depressive disorder), recurrent[place] full [place] 11/27/2015  . Chronic dryness of both eyes 11/27/2015  . Hypothyroidism 11/27/2015  . Asthma, chronic 11/27/2015  . Insomnia 11/27/2015  . Seborrheic keratoses 11/27/2015   JKerin Perna PTA 06/04/16 2:16 PM  [place] [place] [address]Phone: [phone]  Fax:  [phone] Name: Leslie SherrardMRN: [id number] of  Birth: 6January 03, 1957

## 2016-06-10 DIAGNOSIS — S83241D Other tear of medial meniscus, current injury, right knee, subsequent encounter: Secondary | ICD-10-CM | POA: Diagnosis not present

## 2016-06-11 ENCOUNTER — Emergency Department
Admission: EM | Admit: 2016-06-11 | Discharge: 2016-06-11 | Disposition: A | Discharge status: Home / Self Care | Payer: BLUE CROSS/BLUE SHIELD | Source: Home / Self Care | Attending: Family Medicine | Admitting: Family Medicine

## 2016-06-11 ENCOUNTER — Encounter: Payer: Self-pay | Admitting: *Deleted

## 2016-06-11 DIAGNOSIS — N3000 Acute cystitis without hematuria: Secondary | ICD-10-CM

## 2016-06-11 DIAGNOSIS — R300 Dysuria: Secondary | ICD-10-CM

## 2016-06-11 LAB — POCT URINALYSIS DIP (MANUAL ENTRY)
BILIRUBIN UA: NEGATIVE
GLUCOSE UA: NEGATIVE
Ketones, POC UA: NEGATIVE
Nitrite, UA: NEGATIVE
Protein Ur, POC: NEGATIVE
SPEC GRAV UA: 1.025
Urobilinogen, UA: 0.2
pH, UA: 5.5

## 2016-06-11 MED ORDER — CEPHALEXIN 500 MG PO CAPS: 500.0000 mg | ORAL_CAPSULE | Freq: Two times a day (BID) | ORAL | 0 refills | Status: DC

## 2016-06-11 NOTE — ED Provider Notes (Signed)
[name]    CSN: [id number] Arrival date & time: 06/11/16  1831     History   Chief Complaint Chief Complaint  Patient presents with  . Urinary Frequency    HPI [place] is a 61 y.o. female.   Patient developed vague mild dysuria and frequency two days ago that has persisted.  She feels well otherwise.  No abdominal or pelvic pain.  No fevers, chills, and sweats.   The history is provided by the patient.  Urinary Frequency  This is a new problem. The current episode started 2 days ago. The problem occurs constantly. The problem has not changed since onset.Pertinent negatives include no abdominal pain. Nothing aggravates the symptoms. Nothing relieves the symptoms. She has tried nothing for the symptoms.    Past Medical History:  Diagnosis Date  . Depression   . Idiopathic scoliosis 01/22/2016  . Thyroid disease     Patient Active Problem List   Diagnosis Date Noted  . Left knee pain 04/28/2016  . Acute medial meniscus tear 04/23/2016  . Paresthesia 04/03/2016  . Facet hypertrophy of lumbosacral region 04/03/2016  . Chronic back pain 04/03/2016  . Right knee pain 02/19/2016  . Rosacea 02/04/2016  . Vaginal atrophy 01/31/2016  . Idiopathic scoliosis 01/22/2016  . Hip bursitis 12/25/2015  . Biceps tendonitis on right 12/25/2015  . Diverticulitis of colon 12/21/2015  . GERD (gastroesophageal reflux disease) 11/27/2015  . Osteoporosis 11/27/2015  . GAD (generalized anxiety disorder) 11/27/2015  . MDD (major depressive disorder), recurrent, in full remission ([place]) 11/27/2015  . Chronic dryness of both eyes 11/27/2015  . Hypothyroidism 11/27/2015  . Asthma, chronic 11/27/2015  . Insomnia 11/27/2015  . Seborrheic keratoses 11/27/2015    Past Surgical History:  Procedure Laterality Date  . ABDOMINAL HYSTERECTOMY    . bladder mesh    . ELBOW SURGERY Right   . SPINAL FUSION    . TONSILLECTOMY    . TOTAL HIP ARTHROPLASTY      OB  History    No data available       Home Medications    Prior to Admission medications   Medication Sig Start Date End Date Taking? Authorizing Provider  acetaminophen-codeine (TYLENOL #3) 300-30 MG tablet Take by mouth as needed for moderate pain.    Historical Provider, MD  albuterol (PROVENTIL HFA;VENTOLIN HFA) 108 (90 Base) MCG/ACT inhaler Inhale into the lungs as needed for wheezing or shortness of breath.    Historical Provider, MD  busPIRone (BUSPAR) 30 MG tablet TAKE 1 TABLET (30 MG TOTAL) BY MOUTH 2 (TWO) TIMES DAILY. 06/04/16   Jade L Breeback, PA-C  cephALEXin (KEFLEX) 500 MG capsule Take 1 capsule (500 mg total) by mouth 2 (two) times daily. 06/11/16   Kandra Nicolas, MD  Cholecalciferol (VITAMIN D PO) Take 1 tablet by mouth 2 (two) times daily.    Historical Provider, MD  clonazePAM (KLONOPIN) 2 MG tablet TAKE 1 AND 1/2 TABLETS BY MOUTH DAILY 04/18/16   Jade L Breeback, PA-C  cycloSPORINE (RESTASIS) 0.05 % ophthalmic emulsion Place 1 drop into both eyes 2 (two) times daily.    Historical Provider, MD  denosumab (PROLIA) 60 MG/ML SOLN injection Inject 60 mg into the skin every 6 (six) months. Administer in upper arm, thigh, or abdomen    Historical Provider, MD  desvenlafaxine (PRISTIQ) 100 MG 24 hr tablet TAKE 1 TABLET BY MOUTH DAILY 03/17/16   Jade L Breeback, PA-C  desvenlafaxine (PRISTIQ) 50 MG 24 hr tablet  TAKE 1 TABLET BY MOUTH DAILY 02/06/16   Donella Stade, PA-C  DEXILANT 60 MG capsule TAKE 1 CAPSULE (60 MG) BY MOUTH DAILY 05/15/16   Jade L Breeback, PA-C  diclofenac sodium (VOLTAREN) 1 % GEL APPLY 4G TOPICALLY 4 TIMES DAILY 05/05/16   Jade L Breeback, PA-C  flunisolide (NASAREL) 29 MCG/ACT (0.025%) nasal spray Place 2 sprays into the nose daily as needed for rhinitis. Dose is for each nostril.    Historical Provider, MD  fluticasone furoate-vilanterol (BREO ELLIPTA) 100-25 MCG/INH AEPB Inhale 1 puff into the lungs daily.    Historical Provider, MD  gabapentin (NEURONTIN) 300 MG  capsule One tab PO qHS for a week, then BID for a week, then TID. May double weekly to a max of 3,600mg /day 04/03/16   Gregor Hams, MD  lamoTRIgine (LAMICTAL) 200 MG tablet TAKE ONE TABLET BY MOUTH AT BEDTIME 05/16/16   Jade L Breeback, PA-C  levothyroxine (SYNTHROID, LEVOTHROID) 125 MCG tablet Take 62.5 mcg by mouth daily before breakfast.    Historical Provider, MD  metroNIDAZOLE (METROGEL) 1 % gel Apply topically daily. 02/04/16   Jade L Breeback, PA-C  naproxen sodium (ANAPROX) 220 MG tablet Take 220 mg by mouth as needed.    Historical Provider, MD  Omega-3 Fatty Acids (FISH OIL PO) Take 1 tablet by mouth daily.    Historical Provider, MD  traZODone (DESYREL) 50 MG tablet Take 1 tablet (50 mg total) by mouth at bedtime. 01/30/16   Jade L Breeback, PA-C  triamcinolone ointment (KENALOG) 0.1 % Apply 1 application topically 2 (two) times daily. To affected area(s) as needed. Use no longer than 1-2 weeks. 06/04/16   Emeterio Reeve, DO    Family History Family History  Problem Relation Age of Onset  . Cancer Mother     lung  . Depression Mother   . Heart attack Father   . Hyperlipidemia Father   . Hypertension Father   . Diabetes Maternal Aunt   . Hyperlipidemia Paternal Aunt   . COPD Paternal Aunt     Social History Social History  Substance Use Topics  . Smoking status: Never Smoker  . Smokeless tobacco: Never Used  . Alcohol use Yes     Allergies   Adhesive [tape]; Demerol [meperidine]; Dilaudid [hydromorphone]; and Levofloxacin   Review of Systems Review of Systems  Gastrointestinal: Negative for abdominal pain.  Genitourinary: Positive for frequency.  All other systems reviewed and are negative.    Physical Exam Triage Vital Signs ED Triage Vitals  Enc Vitals Group     BP 06/11/16 1900 129/82     Pulse Rate 06/11/16 1900 91     Resp 06/11/16 1900 16     Temp 06/11/16 1900 97.6 F (36.4 C)     Temp Source 06/11/16 1900 Oral     SpO2 06/11/16 1900 97 %      Weight 06/11/16 1900 127 lb (57.6 kg)     Height 06/11/16 1900 4\' 10"  (1.473 m)     Head Circumference --      Peak Flow --      Pain Score 06/11/16 1902 0     Pain Loc --      Pain Edu? --      Excl. in [place]? --    No data found.   Updated Vital Signs BP 129/82 (BP Location: Left Arm)   Pulse 91   Temp 97.6 F (36.4 C) (Oral)   Resp 16   Ht 4\' 10"  (  1.473 m)   Wt 127 lb (57.6 kg)   SpO2 97%   BMI 26.54 kg/m   Visual Acuity Right Eye Distance:   Left Eye Distance:   Bilateral Distance:    Right Eye Near:   Left Eye Near:    Bilateral Near:     Physical Exam Nursing notes and Vital Signs reviewed. Appearance:  Patient appears stated age, and in no acute distress.    Eyes:  Pupils are equal, round, and reactive to light and accomodation.  Extraocular movement is intact.  Conjunctivae are not inflamed   Pharynx:  Normal; moist mucous membranes  Neck:  Supple.  No adenopathy Lungs:  Clear to auscultation.  Breath sounds are equal.  Moving air well. Heart:  Regular rate and rhythm without murmurs, rubs, or gallops.  Abdomen:  Nontender without masses or hepatosplenomegaly.  Bowel sounds are present.  No CVA or flank tenderness.  Extremities:  No edema.  Skin:  No rash present.     UC Treatments / Results  Labs (all labs ordered are listed, but only abnormal results are displayed) Labs Reviewed  POCT URINALYSIS DIP (MANUAL ENTRY) - Abnormal; Notable for the following:       Result Value   Clarity, UA cloudy (*)    Blood, UA trace-intact (*)    Leukocytes, UA moderate (2+) (*)    All other components within normal limits  URINE CULTURE    EKG  EKG Interpretation None       Radiology No results found.  Procedures Procedures (including critical care time)  Medications Ordered in UC Medications - No data to display   Initial Impression / Assessment and Plan / UC Course  I have reviewed the triage vital signs and the nursing notes.  Pertinent labs &  imaging results that were available during my care of the patient were reviewed by me and considered in my medical decision making (see chart for details).    Urine culture pending. Begin Keflex 500mg  BID for one week. May use non-prescription AZO for about two days, if desired, to decrease urinary discomfort.  Increase fluid intake. If symptoms become significantly worse during the night or over the weekend, proceed to the local emergency room.  Followup with Family Doctor if not improved in five days.     Final Clinical Impressions(s) / UC Diagnoses   Final diagnoses:  Dysuria  Acute cystitis without hematuria    New Prescriptions New Prescriptions   CEPHALEXIN (KEFLEX) 500 MG CAPSULE    Take 1 capsule (500 mg total) by mouth 2 (two) times daily.     Kandra Nicolas, MD 06/11/16 [phone]

## 2016-06-11 NOTE — Discharge Instructions (Signed)
Increase fluid intake. May use non-prescription AZO for about two days, if desired, to decrease urinary discomfort.  If symptoms become significantly worse during the night or over the weekend, proceed to the local emergency room.  

## 2016-06-11 NOTE — ED Triage Notes (Signed)
Pt c/o urinary frequency and dysuria x 2 days. Denies fever.

## 2016-06-13 ENCOUNTER — Ambulatory Visit (INDEPENDENT_AMBULATORY_CARE_PROVIDER_SITE_OTHER): Payer: BLUE CROSS/BLUE SHIELD | Admitting: Rehabilitative and Restorative Service Providers"

## 2016-06-13 ENCOUNTER — Encounter: Payer: Self-pay | Admitting: Rehabilitative and Restorative Service Providers"

## 2016-06-13 DIAGNOSIS — R531 Weakness: Secondary | ICD-10-CM

## 2016-06-13 DIAGNOSIS — M25561 Pain in right knee: Secondary | ICD-10-CM

## 2016-06-13 DIAGNOSIS — M25562 Pain in left knee: Secondary | ICD-10-CM | POA: Diagnosis not present

## 2016-06-13 DIAGNOSIS — M6281 Muscle weakness (generalized): Secondary | ICD-10-CM | POA: Diagnosis not present

## 2016-06-13 NOTE — Patient Instructions (Addendum)
Forward    Facing step, place one leg on step, flexed at hip. Step up slowly, bringing hips in line with knee and shoulder. Bring other foot onto step. Reverse process to step back down. Repeat with other leg. Do __10__ repetitions, __2-3__ sets.   Lateral Step Up    Stand to side of step. Step up leading with left leg. Slowly step down leading with opposite leg. Perform _10__ reps. 2-3 sets    Step-Down: Forward    Step down forward. Keep pelvis neutral. Do _10__ times, each leg, __2-3 sets   SIT TO STAND: No Device    Sit with feet shoulder-width apart, on floor. Lean chest forward, raise hips up from surface. Straighten hips and knees. Weight bear equally on left and right sides. __10_ reps per set, __2-3_ sets per day, ___ days per week Place right leg closer to sitting surface to make right work harder. Work in mid range - move slowly

## 2016-06-13 NOTE — Therapy (Addendum)
[facility] [place], [place], [address] Phone: [phone]   Fax:  [phone]  Physical Therapy Treatment  Patient Details  Name: Leslie Roberson MRN: [id number] Date of Birth: Dec 03, 1955 Referring Provider: Dr Lynne Leader   Encounter Date: 06/13/2016      PT End of Session - 06/13/16 1107    Visit Number 7   Number of Visits 12   Date for PT Re-Evaluation 06/13/16   PT Start Time 1104   PT Stop Time 1149   PT Time Calculation (min) 45 min   Activity Tolerance Patient tolerated treatment well      Past Medical History:  Diagnosis Date  . Depression   . Idiopathic scoliosis 01/22/2016  . Thyroid disease     Past Surgical History:  Procedure Laterality Date  . ABDOMINAL HYSTERECTOMY    . bladder mesh    . ELBOW SURGERY Right   . SPINAL FUSION    . TONSILLECTOMY    . TOTAL HIP ARTHROPLASTY      There were no vitals filed for this visit.      Subjective Assessment - 06/13/16 1107    Subjective Patient reports that she fell Tuesday 06/09/16 in the ice. She hit her knee and jarred her knee and irritated her whole body. Surgery has been postponed until 06/20/16. Rash has improved.    Pertinent History Scoliosis; spinal fusion thoracic and lumbar in 1969 and 1970; chronic scapular and shoulder pain; lt hip replacement 1998; dislocated elbow ~6 years ago    Limitations Walking;House hold activities   How long can you sit comfortably? some discomfort in lower chairs   How long can you walk comfortably? 3-5 min   Patient Stated Goals improve pain   Currently in Pain? Yes   Pain Score 4    Pain Location Knee   Pain Orientation Right  front of knee where she hit knee when she feel    Pain Descriptors / Indicators Aching   Pain Type Chronic pain   Pain Onset More than a month ago   Pain Frequency Intermittent            [facility] PT Assessment - 06/13/16 0001      Assessment   Medical  Diagnosis Rt/Lt knee pain   Referring Provider Dr Lynne Leader    Onset Date/Surgical Date 04/21/16  scheduled for sx Rt knee 06/06/16   Hand Dominance Right   Next MD Visit PRN     AROM   Right Knee Extension -1   Right Knee Flexion 137   Left Knee Extension -1   Left Knee Flexion 137     Strength   Right Hip Flexion 5/5   Right Hip Extension 5/5   Right Hip ABduction 4+/5   Right Hip ADduction 5/5   Left Hip Flexion 5/5   Left Hip Extension 5/5   Left Hip ABduction 4+/5   Left Hip ADduction 5/5   Right Knee Flexion --  5-/5   Right Knee Extension 5/5   Left Knee Flexion 5/5   Left Knee Extension 5/5                     [facility] Adult PT Treatment/Exercise - 06/13/16 0001      Knee/Hip Exercises: Standing   Hip Abduction Right;Left;2 sets;10 reps;Knee straight  green TB above knees    Hip Extension Right;Left;Stengthening;2 sets;10 reps;Knee straight  green TB above knees   Lateral Step  Up Right;2 sets;10 reps   Forward Step Up Right;2 sets;10 reps   Step Down Right;2 sets;10 reps     Knee/Hip Exercises: Seated   Sit to Sand 10 reps;without UE support  working in mid range slow control      Knee/Hip Exercises: Supine   Bridges 10 reps   Bridges with Clamshell Strengthening;Both;2 sets;10 reps   Other Supine Knee/Hip Exercises single clamshells in hooklying with green theraband x 10 x 2 sets Rt/Lt      Knee/Hip Exercises: Sidelying   Hip ABduction Strengthening;Right;2 sets;10 reps   Other Sidelying Knee/Hip Exercises hip abduction with toe tap forward and back x 3 each 10 reps      Knee/Hip Exercises: Prone   Hip Extension Strengthening;Right;2 sets;10 reps     Cryotherapy   Number Minutes Cryotherapy 15 Minutes   Cryotherapy Location Knee  Rt   Type of Cryotherapy Ice pack                PT Education - 06/13/16 1131    Education provided Yes   Education Details HEP - continue strengthening pending Rt knee surgery    Person(s) Educated  Patient   Methods Explanation   Comprehension Verbalized understanding;Returned demonstration             PT Long Term Goals - 06/13/16 1130      PT LONG TERM GOAL #1   Title independent with HEP (06/13/16)   Time 6   Period Weeks   Status Achieved     PT LONG TERM GOAL #2   Title improve L hip abduction strength to 4/5 for improved activity tolerance (06/13/16)   Time 6   Period Weeks   Status Achieved     PT LONG TERM GOAL #3   Title report ability to walk > 15 min without increase in pain for improved activity tolerance and decreased pain (06/13/16)   Time 6   Period Weeks   Status Achieved     PT LONG TERM GOAL #4   Title improve FOTO to </= 48% limited (06/13/16)   Time 6   Period Weeks   Status On-going               Plan - 06/13/16 1128    Clinical Impression Statement Continued increased bilat LE strength. She has good mobility through knees. Tolerated exercise well. Scheduled for surgery 06/21/26. Will hold PT pending surgery and PT orders following surgery.    Rehab Potential Good   PT Frequency 2x / week   PT Duration 6 weeks   PT Treatment/Interventions Patient/family education;ADLs/Self Care Home Management;Neuromuscular re-education;Cryotherapy;Electrical Stimulation;Iontophoresis 10m/ml Dexamethasone;Moist Heat;Ultrasound;Dry needling;Manual techniques;Therapeutic activities;Therapeutic exercise;Functional mobility training;Stair training;Gait training;Vasopneumatic Device;Taping   PT Next Visit Plan Will hold therapy pending Rt knee surgery    Consulted and Agree with Plan of Care Patient      Patient will benefit from skilled therapeutic intervention in order to improve the following deficits and impairments:  Decreased mobility, Decreased strength, Pain, Increased fascial restricitons  Visit Diagnosis: Acute pain of left knee  Acute pain of right knee  Weakness generalized  Muscle weakness (generalized)     Problem List Patient  Active Problem List   Diagnosis Date Noted  . Left knee pain 04/28/2016  . Acute medial meniscus tear 04/23/2016  . Paresthesia 04/03/2016  . Facet hypertrophy of lumbosacral region 04/03/2016  . Chronic back pain 04/03/2016  . Right knee pain 02/19/2016  . Rosacea 02/04/2016  . Vaginal atrophy  01/31/2016  . Idiopathic scoliosis 01/22/2016  . Hip bursitis 12/25/2015  . Biceps tendonitis on right 12/25/2015  . Diverticulitis of colon 12/21/2015  . GERD (gastroesophageal reflux disease) 11/27/2015  . Osteoporosis 11/27/2015  . GAD (generalized anxiety disorder) 11/27/2015  . MDD (major depressive disorder), recurrent, in full remission ([place]) 11/27/2015  . Chronic dryness of both eyes 11/27/2015  . Hypothyroidism 11/27/2015  . Asthma, chronic 11/27/2015  . Insomnia 11/27/2015  . Seborrheic keratoses 11/27/2015    Devine Klingel Nilda Simmer PT, MPH  06/13/2016, 11:47 AM  [facility] [place], [place], [address] Phone: [phone]   Fax:  [phone]  Name: Donyetta Ogletree MRN: [id number] Date of Birth: [birth year]  PHYSICAL THERAPY DISCHARGE SUMMARY  Visits from Start of Care: 7  Current functional level related to goals / functional outcomes: See progress note for discharge status   Remaining deficits: unknown   Education / Equipment: HEP Plan: Patient agrees to discharge.  Patient goals were partially met. Patient is being discharged due to not returning since the last visit.  ?????     Edee Nifong P. Helene Kelp PT, MPH 07/14/16 2:28 PM

## 2016-06-14 ENCOUNTER — Telehealth: Payer: Self-pay | Admitting: Emergency Medicine

## 2016-06-14 LAB — URINE CULTURE

## 2016-06-14 NOTE — Telephone Encounter (Signed)
Pt informed of results.  Urine culture sensitive to Keflex given.  Cole[place]

## 2016-06-20 DIAGNOSIS — M94261 Chondromalacia, right knee: Secondary | ICD-10-CM | POA: Diagnosis not present

## 2016-06-20 DIAGNOSIS — S83241A Other tear of medial meniscus, current injury, right knee, initial encounter: Secondary | ICD-10-CM | POA: Diagnosis not present

## 2016-06-20 DIAGNOSIS — S83281A Other tear of lateral meniscus, current injury, right knee, initial encounter: Secondary | ICD-10-CM | POA: Diagnosis not present

## 2016-06-20 DIAGNOSIS — G8918 Other acute postprocedural pain: Secondary | ICD-10-CM | POA: Diagnosis not present

## 2016-06-22 ENCOUNTER — Other Ambulatory Visit: Payer: Self-pay | Admitting: Physician Assistant

## 2016-06-24 DIAGNOSIS — S83281D Other tear of lateral meniscus, current injury, right knee, subsequent encounter: Secondary | ICD-10-CM | POA: Diagnosis not present

## 2016-06-24 DIAGNOSIS — N159 Renal tubulo-interstitial disease, unspecified: Secondary | ICD-10-CM | POA: Diagnosis not present

## 2016-06-24 DIAGNOSIS — S83241D Other tear of medial meniscus, current injury, right knee, subsequent encounter: Secondary | ICD-10-CM | POA: Diagnosis not present

## 2016-06-30 DIAGNOSIS — S83281D Other tear of lateral meniscus, current injury, right knee, subsequent encounter: Secondary | ICD-10-CM | POA: Diagnosis not present

## 2016-06-30 DIAGNOSIS — S83241D Other tear of medial meniscus, current injury, right knee, subsequent encounter: Secondary | ICD-10-CM | POA: Diagnosis not present

## 2016-07-01 ENCOUNTER — Other Ambulatory Visit: Payer: Self-pay | Admitting: Physician Assistant

## 2016-07-09 ENCOUNTER — Ambulatory Visit (INDEPENDENT_AMBULATORY_CARE_PROVIDER_SITE_OTHER): Payer: BLUE CROSS/BLUE SHIELD | Admitting: Physician Assistant

## 2016-07-09 ENCOUNTER — Encounter: Payer: Self-pay | Admitting: Physician Assistant

## 2016-07-09 ENCOUNTER — Other Ambulatory Visit: Payer: Self-pay | Admitting: Physician Assistant

## 2016-07-09 VITALS — BP 107/65 | HR 99 | Temp 98.2°F | Resp 18 | Wt 124.7 lb

## 2016-07-09 DIAGNOSIS — J4520 Mild intermittent asthma, uncomplicated: Secondary | ICD-10-CM

## 2016-07-09 DIAGNOSIS — M419 Scoliosis, unspecified: Secondary | ICD-10-CM | POA: Diagnosis not present

## 2016-07-09 DIAGNOSIS — G8929 Other chronic pain: Secondary | ICD-10-CM | POA: Diagnosis not present

## 2016-07-09 DIAGNOSIS — Z79891 Long term (current) use of opiate analgesic: Secondary | ICD-10-CM | POA: Diagnosis not present

## 2016-07-09 DIAGNOSIS — M47816 Spondylosis without myelopathy or radiculopathy, lumbar region: Secondary | ICD-10-CM | POA: Diagnosis not present

## 2016-07-09 MED ORDER — IPRATROPIUM-ALBUTEROL 0.5-2.5 (3) MG/3ML IN SOLN
3.0000 mL | Freq: Once | RESPIRATORY_TRACT | Status: AC
  Administered 2016-07-09: 3 mL via RESPIRATORY_TRACT

## 2016-07-09 MED ORDER — ALBUTEROL SULFATE HFA 108 (90 BASE) MCG/ACT IN AERS: 1.0000 | INHALATION_SPRAY | RESPIRATORY_TRACT | 3 refills | Status: DC | PRN

## 2016-07-09 MED ORDER — IPRATROPIUM-ALBUTEROL 0.5-2.5 (3) MG/3ML IN SOLN: 3.0000 mL | Freq: Four times a day (QID) | RESPIRATORY_TRACT | Status: DC

## 2016-07-09 MED ORDER — MONTELUKAST SODIUM 5 MG PO CHEW: 5.0000 mg | CHEWABLE_TABLET | Freq: Every day | ORAL | 3 refills | Status: DC

## 2016-07-09 NOTE — Progress Notes (Signed)
HPI:                                                                Marcus Groll Trillo is a 61 y.o. female who presents to [facility]: [place] today for cough  Cough  This is a new problem. The current episode started yesterday. The problem has been gradually worsening. The problem occurs hourly. The cough is non-productive. Associated symptoms include chills, myalgias and shortness of breath. Pertinent negatives include no ear pain, fever, nasal congestion, postnasal drip or sore throat. Associated symptoms comments: "chest tightness". Treatments tried: aspirin. Her past medical history is significant for asthma.  Patient states she has not been using Breo because she feels she has not needed it. She occasionally takes pediatric dose of Claritin, but states that antihistamines make her both tired and jittery.  Past Medical History:  Diagnosis Date  . Anxiety   . Asthma, chronic 11/27/2015  . Depression   . Diverticulitis of colon 12/21/2015   Colonoscopy every 5 years/digestive health.   . GERD (gastroesophageal reflux disease) 11/27/2015  . Idiopathic scoliosis 01/22/2016  . Osteoporosis 11/27/2015   On prolia.   . Thyroid disease    Past Surgical History:  Procedure Laterality Date  . ABDOMINAL HYSTERECTOMY    . bladder mesh    . ELBOW SURGERY Right   . SPINAL FUSION    . TONSILLECTOMY    . TOTAL HIP ARTHROPLASTY     Social History  Substance Use Topics  . Smoking status: Never Smoker  . Smokeless tobacco: Never Used  . Alcohol use Yes   family history includes COPD in her paternal aunt; Cancer in her mother; Depression in her mother; Diabetes in her maternal aunt; Heart attack in her father; Hyperlipidemia in her father and paternal aunt; Hypertension in her father.  ROS: negative except as noted in the HPI  Medications: Current Outpatient Prescriptions  Medication Sig Dispense Refill  . acetaminophen-codeine (TYLENOL #3) 300-30  MG tablet Take by mouth as needed for moderate pain.    . busPIRone (BUSPAR) 30 MG tablet TAKE 1 TABLET BY MOUTH TWICE A DAY 60 tablet 0  . Cholecalciferol (VITAMIN D PO) Take 1 tablet by mouth 2 (two) times daily.    . clonazePAM (KLONOPIN) 2 MG tablet TAKE 1 AND 1/2 TABLETS BY MOUTH DAILY 45 tablet 0  . cycloSPORINE (RESTASIS) 0.05 % ophthalmic emulsion Place 1 drop into both eyes 2 (two) times daily.    Marland Kitchen denosumab (PROLIA) 60 MG/ML SOLN injection Inject 60 mg into the skin every 6 (six) months. Administer in upper arm, thigh, or abdomen    . desvenlafaxine (PRISTIQ) 100 MG 24 hr tablet TAKE 1 TABLET BY MOUTH DAILY 30 tablet 4  . desvenlafaxine (PRISTIQ) 50 MG 24 hr tablet TAKE 1 TABLET BY MOUTH DAILY 30 tablet 4  . DEXILANT 60 MG capsule TAKE 1 CAPSULE (60 MG) BY MOUTH DAILY 30 capsule 3  . diclofenac sodium (VOLTAREN) 1 % GEL APPLY 4G TOPICALLY 4 TIMES DAILY 200 g 2  . flunisolide (NASAREL) 29 MCG/ACT (0.025%) nasal spray Place 2 sprays into the nose daily as needed for rhinitis. Dose is for each nostril.    Marland Kitchen gabapentin (NEURONTIN) 300 MG capsule One tab PO  qHS for a week, then BID for a week, then TID. May double weekly to a max of 3,600mg /day 180 capsule 3  . lamoTRIgine (LAMICTAL) 200 MG tablet TAKE ONE TABLET BY MOUTH AT BEDTIME 90 tablet 0  . levothyroxine (SYNTHROID, LEVOTHROID) 125 MCG tablet TAKE ONE-HALF TABLET BY MOUTH ONCE DAILY 30 tablet 2  . metroNIDAZOLE (METROGEL) 1 % gel Apply topically daily. 45 g 0  . naproxen sodium (ANAPROX) 220 MG tablet Take 220 mg by mouth as needed.    . Omega-3 Fatty Acids (FISH OIL PO) Take 1 tablet by mouth daily.    . traZODone (DESYREL) 50 MG tablet Take 1 tablet (50 mg total) by mouth at bedtime. 90 tablet 1  . triamcinolone ointment (KENALOG) 0.1 % Apply 1 application topically 2 (two) times daily. To affected area(s) as needed. Use no longer than 1-2 weeks. 30 g 0  . albuterol (PROVENTIL HFA;VENTOLIN HFA) 108 (90 Base) MCG/ACT inhaler Inhale  1-2 puffs into the lungs every 4 (four) hours as needed for wheezing or shortness of breath. 1 Inhaler 3  . fluticasone furoate-vilanterol (BREO ELLIPTA) 100-25 MCG/INH AEPB Inhale 1 puff into the lungs daily.     No current facility-administered medications for this visit.    Allergies  Allergen Reactions  . Adhesive [Tape]   . Demerol [Meperidine]   . Dilaudid [Hydromorphone]   . Levofloxacin        Objective:  BP 107/65   Pulse 99   Temp 98.2 F (36.8 C)   Resp 18   Wt 124 lb 11.2 oz (56.6 kg)   SpO2 98%   BMI 26.06 kg/m  Gen: well-groomed, cooperative, not ill-appearing, no distress HEENT: normal conjunctiva, wearing glasses, unable to visualize TM's due to narrow canals,  oropharynx clear, there is cobblestoning of the posterior tongue, moist mucus membranes, no frontal or maxillary sinus tenderness Pulm: Normal work of breathing, normal phonation, clear to auscultation bilaterally, expiratory wheeze in the left upper lobe, otherwise clear to auscultation, no rales or rhonchi CV: Normal rate, regular rhythm, s1 and s2 distinct, no murmurs, clicks or rubs  Neuro: alert and oriented x 3, EOM's intact Lymph: no cervical or tonsillar adenopathy Skin: warm and dry, no rashes or lesions on exposed skin, no cyanosis   No results found for this or any previous visit (from the past 72 hour(s)). No results found.    Assessment and Plan: 61 y.o. female with   1. Mild intermittent chronic asthma without complication - no signs of acute asthma exacerbation today, O2 sats 98% on room air. Suspect patient has early symptoms of an acute viral URI that are triggering her asthma - DuoNeb given in clinic for symptomatic management  - refilled Albuterol and started Singulair at pediatric dose - recommended follow-up with PCP to discuss asthma treatment plan - albuterol (PROVENTIL HFA;VENTOLIN HFA) 108 (90 Base) MCG/ACT inhaler; Inhale 1-2 puffs into the lungs every 4 (four) hours as  needed for wheezing or shortness of breath.  Dispense: 1 Inhaler; Refill: 3 - montelukast (SINGULAIR) 5 MG chewable tablet; Chew 1 tablet (5 mg total) by mouth at bedtime.  Dispense: 30 tablet; Refill: 3  Patient education and anticipatory guidance given Patient agrees with treatment plan Follow-up with PCP in 1 month or sooner as needed if symptoms worsen or fail to improve  Darlyne Russian PA-C

## 2016-07-09 NOTE — Patient Instructions (Addendum)
- Take Singulair 5mg  chewable tab at bedtime - Use Albuterol 1-2 puffs every 4 hours as needed for shortness of breath/wheezing - Follow-up with [facility] in 1 month to discuss asthma plan or sooner if no improvement   Asthma, Acute Bronchospasm Acute bronchospasm caused by asthma is also referred to as an asthma attack. Bronchospasm means your air passages become narrowed. The narrowing is caused by inflammation and tightening of the muscles in the air tubes (bronchi) in your lungs. This can make it hard to breathe or cause you to wheeze and cough. What are the causes? Possible triggers are:  Animal dander from the skin, hair, or feathers of animals.  Dust mites contained in house dust.  Cockroaches.  Pollen from trees or grass.  Mold.  Cigarette or tobacco smoke.  Air pollutants such as dust, household cleaners, hair sprays, aerosol sprays, paint fumes, strong chemicals, or strong odors.  Cold air or weather changes. Cold air may trigger inflammation. Winds increase molds and pollens in the air.  Strong emotions such as crying or laughing hard.  Stress.  Certain medicines such as aspirin or beta-blockers.  Sulfites in foods and drinks, such as dried fruits and wine.  Infections or inflammatory conditions, such as a flu, cold, or inflammation of the nasal membranes (rhinitis).  Gastroesophageal reflux disease (GERD). GERD is a condition where stomach acid backs up into your esophagus.  Exercise or strenuous activity. What are the signs or symptoms?  Wheezing.  Excessive coughing, particularly at night.  Chest tightness.  Shortness of breath. How is this diagnosed? Your health care provider will ask you about your medical history and perform a physical exam. A chest X-ray or blood testing may be performed to look for other causes of your symptoms or other conditions that may have triggered your asthma attack. How is this treated? Treatment is aimed at reducing  inflammation and opening up the airways in your lungs. Most asthma attacks are treated with inhaled medicines. These include quick relief or rescue medicines (such as bronchodilators) and controller medicines (such as inhaled corticosteroids). These medicines are sometimes given through an inhaler or a nebulizer. Systemic steroid medicine taken by mouth or given through an IV tube also can be used to reduce the inflammation when an attack is moderate or severe. Antibiotic medicines are only used if a bacterial infection is present. Follow these instructions at home:  Rest.  Drink plenty of liquids. This helps the mucus to remain thin and be easily coughed up. Only use caffeine in moderation and do not use alcohol until you have recovered from your illness.  Do not smoke. Avoid being exposed to secondhand smoke.  You play a critical role in keeping yourself in good health. Avoid exposure to things that cause you to wheeze or to have breathing problems.  Keep your medicines up-to-date and available. Carefully follow your health care provider's treatment plan.  Take your medicine exactly as prescribed.  When pollen or pollution is bad, keep windows closed and use an air conditioner or go to places with air conditioning.  Asthma requires careful medical care. See your health care provider for a follow-up as advised. If you are more than [redacted] weeks pregnant and you were prescribed any new medicines, let your obstetrician know about the visit and how you are doing. Follow up with your health care provider as directed.  After you have recovered from your asthma attack, make an appointment with your outpatient doctor to talk about ways to reduce  the likelihood of future attacks. If you do not have a doctor who manages your asthma, make an appointment with a primary care doctor to discuss your asthma. Get help right away if:  You are getting worse.  You have trouble breathing. If severe, call your local  emergency services (911 in the [place]  You develop chest pain or discomfort.  You are vomiting.  You are not able to keep fluids down.  You are coughing up yellow, green, brown, or bloody sputum.  You have a fever and your symptoms suddenly get worse.  You have trouble swallowing. This information is not intended to replace advice given to you by your health care provider. Make sure you discuss any questions you have with your health care provider. Document Released: 07/02/2006 Document Revised: 08/29/2015 Document Reviewed: 09/22/2012 Elsevier Interactive Patient Education  2017 [employer].

## 2016-07-10 ENCOUNTER — Telehealth: Payer: Self-pay

## 2016-07-10 NOTE — Telephone Encounter (Signed)
[facility] is calling in regards to the singular Rx that was sent yesterday.  They want to know is it suppose to be 5 mg or 10 mg? Please advise. -EH/RMA

## 2016-07-10 NOTE — Telephone Encounter (Signed)
[facility] pharmacy notified. -EH/RMA

## 2016-07-10 NOTE — Telephone Encounter (Signed)
5mg  is correct dose for this patient

## 2016-07-11 ENCOUNTER — Ambulatory Visit (INDEPENDENT_AMBULATORY_CARE_PROVIDER_SITE_OTHER): Payer: BLUE CROSS/BLUE SHIELD | Admitting: Physician Assistant

## 2016-07-11 ENCOUNTER — Ambulatory Visit (HOSPITAL_BASED_OUTPATIENT_CLINIC_OR_DEPARTMENT_OTHER)
Admission: RE | Admit: 2016-07-11 | Discharge: 2016-07-11 | Disposition: A | Discharge status: Home / Self Care | Payer: BLUE CROSS/BLUE SHIELD | Source: Ambulatory Visit | Attending: Physician Assistant | Admitting: Physician Assistant

## 2016-07-11 VITALS — BP 125/76 | HR 89 | Temp 98.4°F | Wt 128.0 lb

## 2016-07-11 DIAGNOSIS — J069 Acute upper respiratory infection, unspecified: Secondary | ICD-10-CM

## 2016-07-11 DIAGNOSIS — R05 Cough: Secondary | ICD-10-CM | POA: Diagnosis not present

## 2016-07-11 DIAGNOSIS — M62838 Other muscle spasm: Secondary | ICD-10-CM | POA: Diagnosis not present

## 2016-07-11 IMAGING — CR DG CHEST 2V
2 series · 2 of 2 positions shown · non-contrast
Comparison: None.

CLINICAL DATA: Cough and fever

EXAM:
CHEST  2 VIEW

[w chest pa]
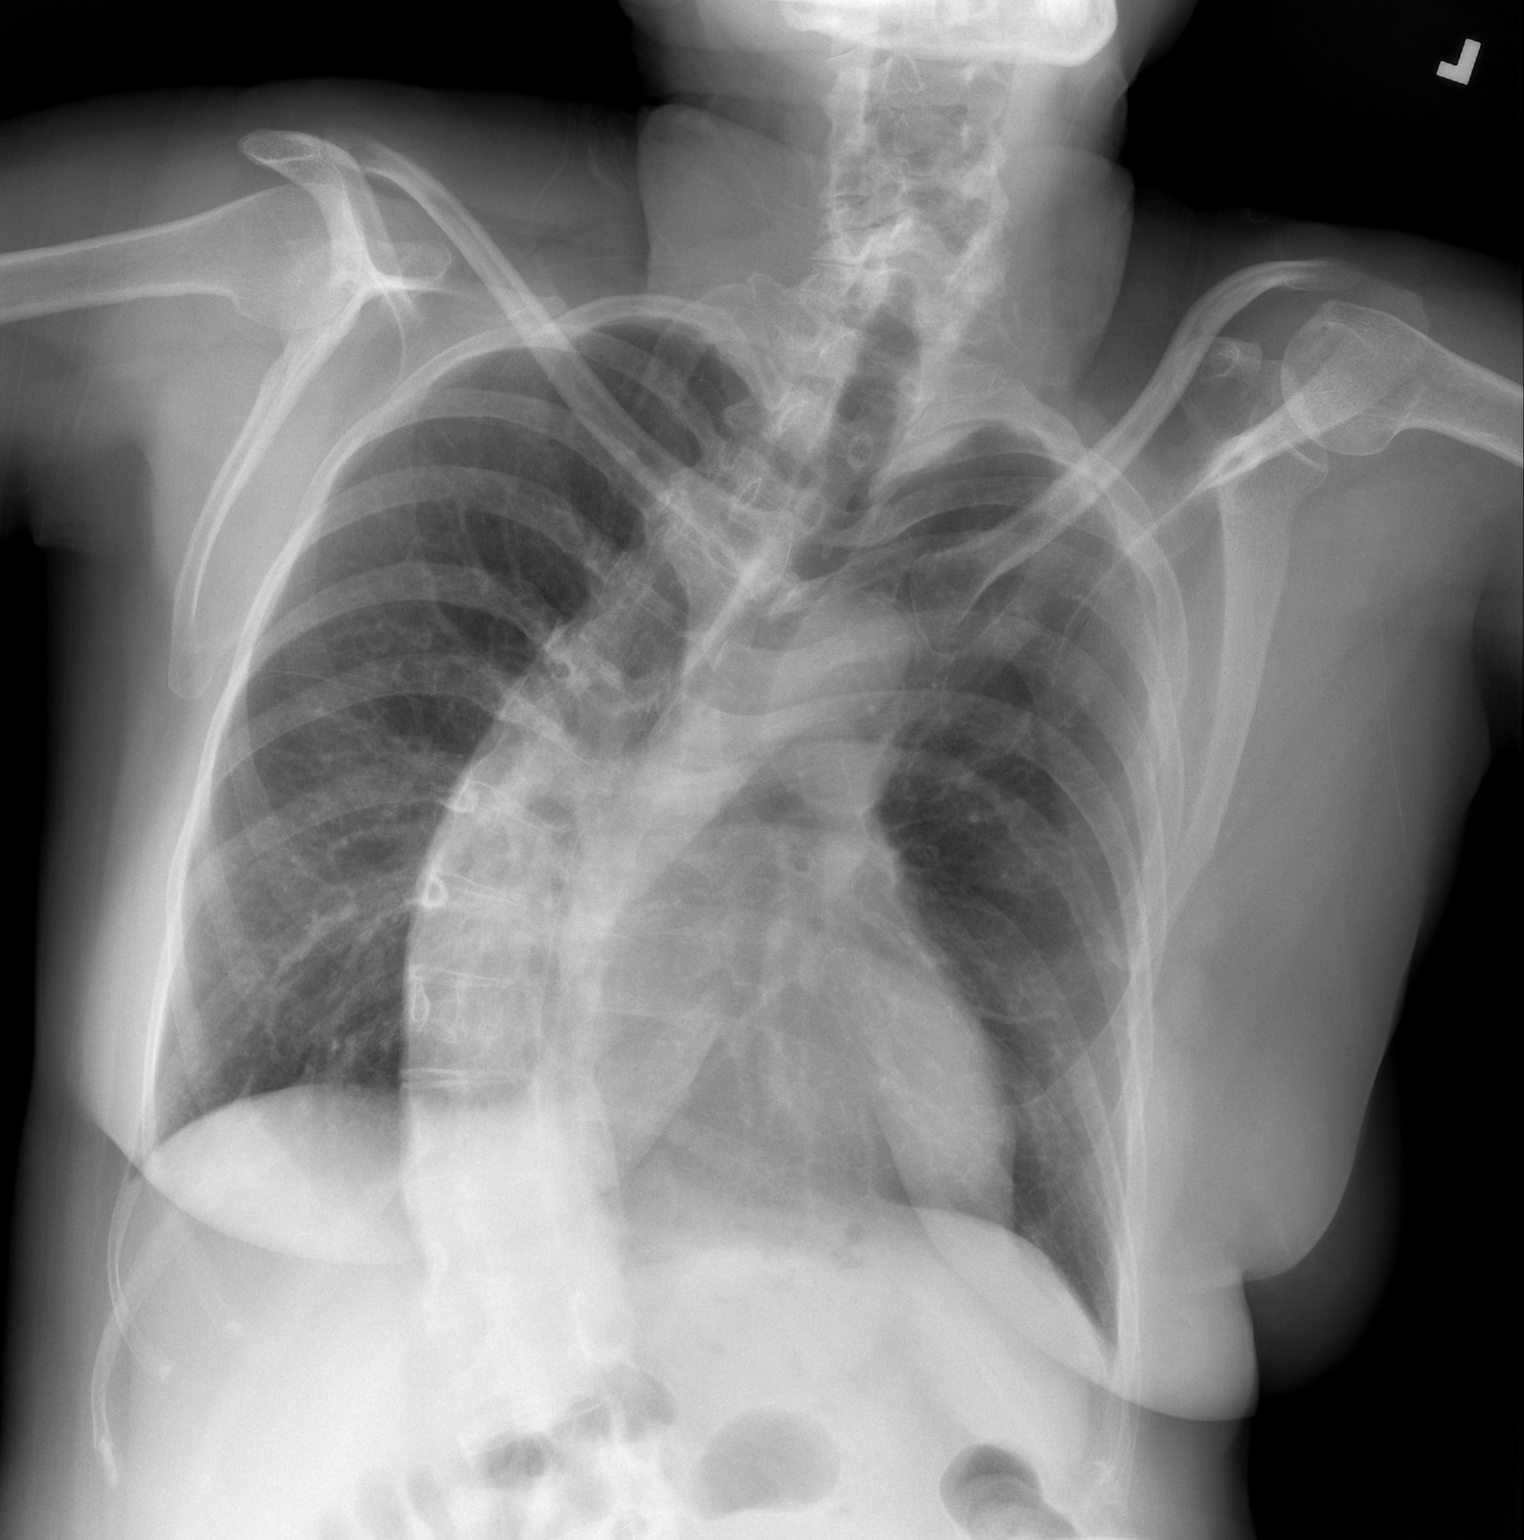

[w chest lat]
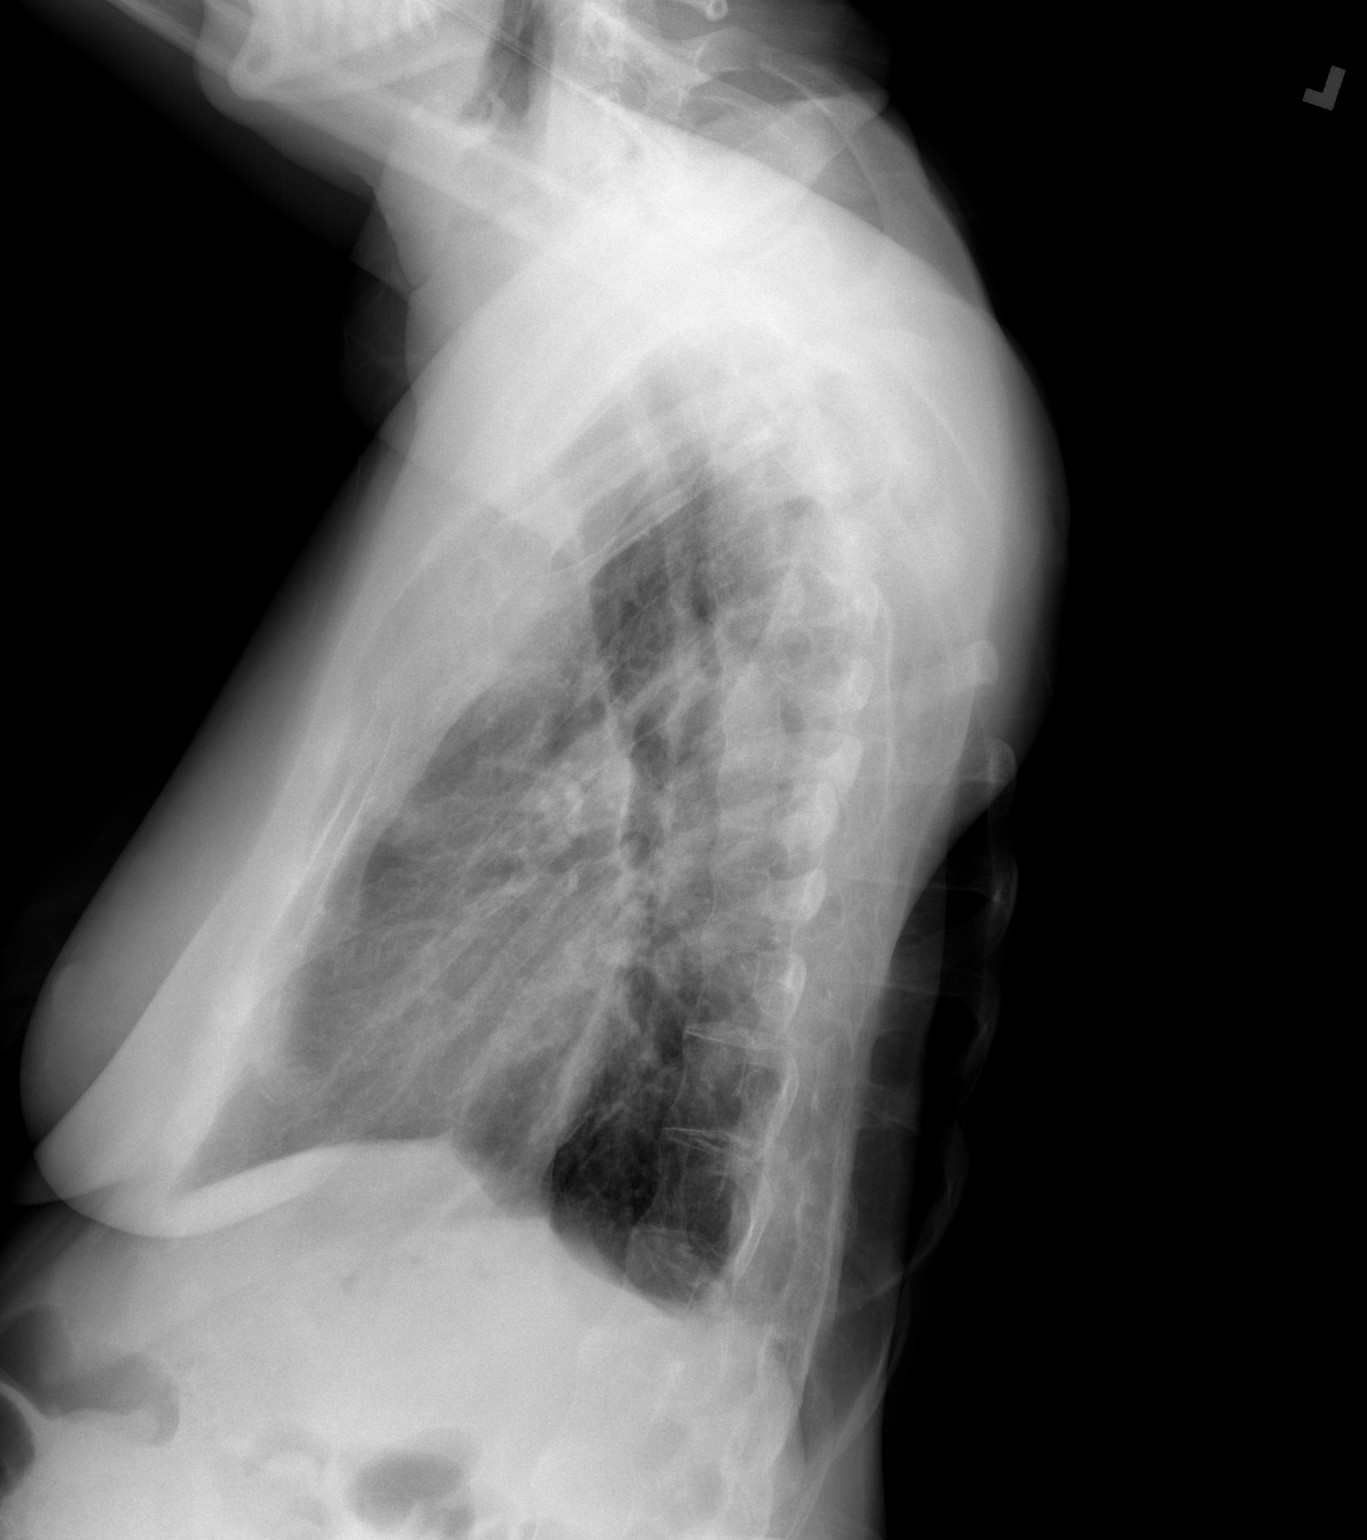

[2 of 2 positions shown; findings below may reference images not displayed]

FINDINGS: There is marked dextroscoliosis of the thoracic spine.
Cardiomediastinal contours are normal. No focal airspace
consolidation or pulmonary edema. No pneumothorax or pleural
effusion.
IMPRESSION: 1. Severe dextroscoliosis of the thoracic spine.
2. No focal airspace disease.

## 2016-07-11 MED ORDER — BENZONATATE 100 MG PO CAPS: 100.0000 mg | ORAL_CAPSULE | Freq: Three times a day (TID) | ORAL | 0 refills | Status: AC | PRN

## 2016-07-11 MED ORDER — PREDNISONE 50 MG PO TABS: ORAL_TABLET | ORAL | 0 refills | Status: DC

## 2016-07-11 NOTE — Patient Instructions (Addendum)
- Take Prednisone once a day for 5 days  - Tessalon every 6 hours as needed for cough - Ice or heat applied to affected area of your back/shoulder (whichever feels better) - Tylenol 500mg  every 6 hours as needed for body aches and fever - Cont your other asthma medications  - Unfortunately due to a power outage, our Xray machine is down today. You can go to our [facility] in [place] for your chest xray today.  [place], [place] [id number]    Upper Respiratory Infection, Adult Most upper respiratory infections (URIs) are a viral infection of the air passages leading to the lungs. A URI affects the nose, throat, and upper air passages. The most common type of URI is nasopharyngitis and is typically referred to as "the common cold." URIs run their course and usually go away on their own. Most of the time, a URI does not require medical attention, but sometimes a bacterial infection in the upper airways can follow a viral infection. This is called a secondary infection. Sinus and middle ear infections are common types of secondary upper respiratory infections. Bacterial pneumonia can also complicate a URI. A URI can worsen asthma and chronic obstructive pulmonary disease (COPD). Sometimes, these complications can require emergency medical care and may be life threatening. What are the causes? Almost all URIs are caused by viruses. A virus is a type of germ and can spread from one person to another. What increases the risk? You may be at risk for a URI if:  You smoke.  You have chronic heart or lung disease.  You have a weakened defense (immune) system.  You are very young or very old.  You have nasal allergies or asthma.  You work in crowded or poorly ventilated areas.  You work in health care facilities or schools. What are the signs or symptoms? Symptoms typically develop 2-3 days after you come in contact with a cold virus. Most viral URIs last 7-10 days. However,  viral URIs from the influenza virus (flu virus) can last 14-18 days and are typically more severe. Symptoms may include:  Runny or stuffy (congested) nose.  Sneezing.  Cough.  Sore throat.  Headache.  Fatigue.  Fever.  Loss of appetite.  Pain in your forehead, behind your eyes, and over your cheekbones (sinus pain).  Muscle aches. How is this diagnosed? Your health care provider may diagnose a URI by:  Physical exam.  Tests to check that your symptoms are not due to another condition such as:  Strep throat.  Sinusitis.  Pneumonia.  Asthma. How is this treated? A URI goes away on its own with time. It cannot be cured with medicines, but medicines may be prescribed or recommended to relieve symptoms. Medicines may help:  Reduce your fever.  Reduce your cough.  Relieve nasal congestion. Follow these instructions at home:  Take medicines only as directed by your health care provider.  Gargle warm saltwater or take cough drops to comfort your throat as directed by your health care provider.  Use a warm mist humidifier or inhale steam from a shower to increase air moisture. This may make it easier to breathe.  Drink enough fluid to keep your urine clear or pale yellow.  Eat soups and other clear broths and maintain good nutrition.  Rest as needed.  Return to work when your temperature has returned to normal or as your health care provider advises. You may need to stay home longer to  avoid infecting others. You can also use a face mask and careful hand washing to prevent spread of the virus.  Increase the usage of your inhaler if you have asthma.  Do not use any tobacco products, including cigarettes, chewing tobacco, or electronic cigarettes. If you need help quitting, ask your health care provider. How is this prevented? The best way to protect yourself from getting a cold is to practice good hygiene.  Avoid oral or hand contact with people with cold  symptoms.  Wash your hands often if contact occurs. There is no clear evidence that vitamin C, vitamin E, echinacea, or exercise reduces the chance of developing a cold. However, it is always recommended to get plenty of rest, exercise, and practice good nutrition. Contact a health care provider if:  You are getting worse rather than better.  Your symptoms are not controlled by medicine.  You have chills.  You have worsening shortness of breath.  You have brown or red mucus.  You have yellow or brown nasal discharge.  You have pain in your face, especially when you bend forward.  You have a fever.  You have swollen neck glands.  You have pain while swallowing.  You have white areas in the back of your throat. Get help right away if:  You have severe or persistent:  Headache.  Ear pain.  Sinus pain.  Chest pain.  You have chronic lung disease and any of the following:  Wheezing.  Prolonged cough.  Coughing up blood.  A change in your usual mucus.  You have a stiff neck.  You have changes in your:  Vision.  Hearing.  Thinking.  Mood. This information is not intended to replace advice given to you by your health care provider. Make sure you discuss any questions you have with your health care provider. Document Released: 09/10/2000 Document Revised: 11/18/2015 Document Reviewed: 06/22/2013 Elsevier Interactive Patient Education  2017 [employer].

## 2016-07-11 NOTE — Progress Notes (Signed)
HPI:                                                                Leslie Roberson is a 61 y.o. female who presents to [facility] [place] today for URI symptoms and upper back/shoulder pain  URI   This is a new problem. The current episode started in the past 7 days (4 days). The problem has been gradually worsening. There has been no fever. Associated symptoms include congestion, coughing (nonproductive), rhinorrhea and sneezing. Pertinent negatives include no chest pain, ear pain, headaches, neck pain, rash, sore throat or wheezing. She has tried inhaler use and antihistamine for the symptoms. The treatment provided mild relief.  Muscle Pain  This is a new problem. The current episode started yesterday. The problem occurs constantly. The problem is unchanged. The pain occurs in the context of recent physical stress. The pain is present in the left shoulder. The pain is mild. The symptoms are aggravated by any movement. Associated symptoms include stiffness. Pertinent negatives include no chest pain, headaches, rash, sensory change or wheezing. Past treatments include prescription narcotic. There is no swelling present. Her past medical history is significant for chronic back pain (scoliosis).  Patient has a history of pneumonia and is concerned about developing this again over the weekend.    Past Medical History:  Diagnosis Date  . Anxiety   . Asthma, chronic 11/27/2015  . Depression   . Diverticulitis of colon 12/21/2015   Colonoscopy every 5 years/digestive health.   . GERD (gastroesophageal reflux disease) 11/27/2015  . Idiopathic scoliosis 01/22/2016  . Osteoporosis 11/27/2015   On prolia.   . Thyroid disease    Past Surgical History:  Procedure Laterality Date  . ABDOMINAL HYSTERECTOMY    . bladder mesh    . ELBOW SURGERY Right   . SPINAL FUSION    . TONSILLECTOMY    . TOTAL HIP ARTHROPLASTY     Social History  Substance Use  Topics  . Smoking status: Never Smoker  . Smokeless tobacco: Never Used  . Alcohol use Yes   family history includes COPD in her paternal aunt; Cancer in her mother; Depression in her mother; Diabetes in her maternal aunt; Heart attack in her father; Hyperlipidemia in her father and paternal aunt; Hypertension in her father.  ROS: negative except as noted in the HPI  Medications: Current Outpatient Prescriptions  Medication Sig Dispense Refill  . acetaminophen-codeine (TYLENOL #3) 300-30 MG tablet Take by mouth as needed for moderate pain.    Marland Kitchen albuterol (PROVENTIL HFA;VENTOLIN HFA) 108 (90 Base) MCG/ACT inhaler Inhale 1-2 puffs into the lungs every 4 (four) hours as needed for wheezing or shortness of breath. 1 Inhaler 3  . benzonatate (TESSALON) 100 MG capsule Take 1 capsule (100 mg total) by mouth 3 (three) times daily as needed for cough. 40 capsule 0  . busPIRone (BUSPAR) 30 MG tablet TAKE 1 TABLET BY MOUTH TWICE A DAY 60 tablet 0  . Cholecalciferol (VITAMIN D PO) Take 1 tablet by mouth 2 (two) times daily.    . clonazePAM (KLONOPIN) 2 MG tablet TAKE 1 AND 1/2 TABLETS BY MOUTH DAILY 45 tablet 0  . cycloSPORINE (RESTASIS) 0.05 % ophthalmic emulsion Place 1 drop into both  eyes 2 (two) times daily.    Marland Kitchen denosumab (PROLIA) 60 MG/ML SOLN injection Inject 60 mg into the skin every 6 (six) months. Administer in upper arm, thigh, or abdomen    . desvenlafaxine (PRISTIQ) 100 MG 24 hr tablet TAKE 1 TABLET BY MOUTH DAILY 30 tablet 4  . desvenlafaxine (PRISTIQ) 50 MG 24 hr tablet TAKE 1 TABLET BY MOUTH DAILY 30 tablet 4  . DEXILANT 60 MG capsule TAKE 1 CAPSULE (60 MG) BY MOUTH DAILY 30 capsule 3  . diclofenac sodium (VOLTAREN) 1 % GEL APPLY 4G TOPICALLY 4 TIMES DAILY 200 g 2  . flunisolide (NASAREL) 29 MCG/ACT (0.025%) nasal spray Place 2 sprays into the nose daily as needed for rhinitis. Dose is for each nostril.    . fluticasone furoate-vilanterol (BREO ELLIPTA) 100-25 MCG/INH AEPB Inhale 1 puff  into the lungs daily.    Marland Kitchen gabapentin (NEURONTIN) 300 MG capsule One tab PO qHS for a week, then BID for a week, then TID. May double weekly to a max of 3,600mg /day 180 capsule 3  . lamoTRIgine (LAMICTAL) 200 MG tablet TAKE ONE TABLET BY MOUTH AT BEDTIME 90 tablet 0  . levothyroxine (SYNTHROID, LEVOTHROID) 125 MCG tablet TAKE ONE-HALF TABLET BY MOUTH ONCE DAILY 30 tablet 2  . metroNIDAZOLE (METROGEL) 1 % gel Apply topically daily. 45 g 0  . montelukast (SINGULAIR) 5 MG chewable tablet Chew 1 tablet (5 mg total) by mouth at bedtime. 30 tablet 3  . naproxen sodium (ANAPROX) 220 MG tablet Take 220 mg by mouth as needed.    . Omega-3 Fatty Acids (FISH OIL PO) Take 1 tablet by mouth daily.    . predniSONE (DELTASONE) 50 MG tablet One tab PO daily for 5 days. 5 tablet 0  . traZODone (DESYREL) 50 MG tablet TAKE 1 TABLET (50 MG TOTAL) BY MOUTH AT BEDTIME. 90 tablet 1  . triamcinolone ointment (KENALOG) 0.1 % Apply 1 application topically 2 (two) times daily. To affected area(s) as needed. Use no longer than 1-2 weeks. 30 g 0   No current facility-administered medications for this visit.    Allergies  Allergen Reactions  . Adhesive [Tape]   . Demerol [Meperidine]   . Dilaudid [Hydromorphone]   . Levofloxacin        Objective:  BP 125/76   Pulse 89   Temp 98.4 F (36.9 C) (Oral)   Wt 128 lb (58.1 kg)   BMI 26.75 kg/m  Gen: well-groomed, cooperative, not ill-appearing, no distress HEENT: normal conjunctiva, TM's clear, nasal mucosa edematous, oropharynx clear, moist mucus membranes, no frontal or maxillary sinus tenderness Pulm: Normal work of breathing, normal phonation, clear to auscultation bilaterally, no wheezes, rales or rhonchi CV: Normal rate, regular rhythm, s1 and s2 distinct, no murmurs, clicks or rubs  Neuro: alert and oriented x 3, EOM's intact MSK: significant thoracic scoliosis, muscular tenderness of the inferior medial aspect of the left scapula, left shoulder atraumatic,  no a/c joint tenderness, full active ROM of the neck and shoulder, no spinous process tenderness Lymph: anterior cervical adenopathy present Skin: warm and dry, no rashes or lesions on exposed skin, no cyanosis   No results found for this or any previous visit (from the past 72 hour(s)). Dg Chest 2 View  Result Date: 07/13/2016 CLINICAL DATA:  Cough and fever EXAM: CHEST  2 VIEW COMPARISON:  None. FINDINGS: There is marked dextroscoliosis of the thoracic spine. Cardiomediastinal contours are normal. No focal airspace consolidation or pulmonary edema. No pneumothorax or pleural  effusion. IMPRESSION: 1. Severe dextroscoliosis of the thoracic spine. 2. No focal airspace disease. Electronically Signed   By: Ulyses Jarred M.D.   On: 07/13/2016 04:08      Assessment and Plan: 61 y.o. female with   1. Acute upper respiratory infection - DG Chest 2 View - lungs are CTA, but CXR would give patient peace of mind - since patient does have history of asthma and we are entering the weekend, will start her on a prednisone burst to prevent acute exacerbation - discussed symptomatic management  - cont asthma medications - predniSONE (DELTASONE) 50 MG tablet; One tab PO daily for 5 days.  Dispense: 5 tablet; Refill: 0 - benzonatate (TESSALON) 100 MG capsule; Take 1 capsule (100 mg total) by mouth 3 (three) times daily as needed for cough.  Dispense: 40 capsule; Refill: 0  2. Muscle spasm of left shoulder area - symptomatic management with Ibuprofen prn - Ice or heat based on patient preference   Patient education and anticipatory guidance given Patient agrees with treatment plan Follow-up as needed if symptoms worsen or fail to improve  Darlyne Russian PA-C

## 2016-07-15 ENCOUNTER — Institutional Professional Consult (permissible substitution): Payer: BLUE CROSS/BLUE SHIELD | Admitting: Family Medicine

## 2016-07-15 ENCOUNTER — Institutional Professional Consult (permissible substitution): Payer: BLUE CROSS/BLUE SHIELD | Admitting: Sports Medicine

## 2016-07-20 ENCOUNTER — Other Ambulatory Visit: Payer: Self-pay | Admitting: Physician Assistant

## 2016-07-21 DIAGNOSIS — S83281D Other tear of lateral meniscus, current injury, right knee, subsequent encounter: Secondary | ICD-10-CM | POA: Diagnosis not present

## 2016-07-21 DIAGNOSIS — S83241D Other tear of medial meniscus, current injury, right knee, subsequent encounter: Secondary | ICD-10-CM | POA: Diagnosis not present

## 2016-07-25 ENCOUNTER — Ambulatory Visit (INDEPENDENT_AMBULATORY_CARE_PROVIDER_SITE_OTHER): Payer: BLUE CROSS/BLUE SHIELD | Admitting: Physician Assistant

## 2016-07-25 ENCOUNTER — Encounter: Payer: Self-pay | Admitting: Physician Assistant

## 2016-07-25 VITALS — BP 119/73 | HR 99 | Ht <= 58 in | Wt 128.0 lb

## 2016-07-25 DIAGNOSIS — F3342 Major depressive disorder, recurrent, in full remission: Secondary | ICD-10-CM | POA: Diagnosis not present

## 2016-07-25 DIAGNOSIS — R438 Other disturbances of smell and taste: Secondary | ICD-10-CM

## 2016-07-25 MED ORDER — LAMOTRIGINE 25 MG PO TABS: ORAL_TABLET | ORAL | 0 refills | Status: DC

## 2016-07-26 ENCOUNTER — Encounter: Payer: Self-pay | Admitting: Physician Assistant

## 2016-07-26 DIAGNOSIS — R438 Other disturbances of smell and taste: Secondary | ICD-10-CM | POA: Insufficient documentation

## 2016-07-26 NOTE — Progress Notes (Signed)
   Subjective:    Patient ID: Leslie Roberson, female    DOB: [birth year], 61 y.o.   MRN: [id number]  HPI  Pt is a 61 yo female who presents to the clinic to discuss a bad taste in her mouth. She has had for many years but recently began to get worse. She linked it to her lamcital brand being changed. She then stopped for a day and the bitter taste went a way. She would like to try tapering off lamctial. She already is taking half the dose at 100mg . She has felt very "swimmy headed" since cutting dose in half.   Review of Systems  All other systems reviewed and are negative.      Objective:   Physical Exam  Constitutional: She is oriented to person, place, and time. She appears well-developed and well-nourished.  HENT:  Head: Normocephalic and atraumatic.  Cardiovascular: Normal rate, regular rhythm and normal heart sounds.   Pulmonary/Chest: Effort normal and breath sounds normal.  Neurological: She is alert and oriented to person, place, and time.  Psychiatric: She has a normal mood and affect. Her behavior is normal.          Assessment & Plan:  Marland KitchenMarland KitchenDiagnoses and all orders for this visit:  Bad taste in mouth  MDD (major depressive disorder), recurrent, in full remission ([place])  Other orders -     lamoTRIgine (LAMICTAL) 25 MG tablet; Start 3 tablets for 1 week, then start 2 tablets for 1 weeks, and then 1 tablet for 1 week.   Discussed taper off lamictal. Stay at 100mg  for 4 weeks then see above taper down by 25mg  weekly until off.  If depression worsen will start abilify. Discussed side effects. Pt would like to see if she needs another medication or will be find off lamictal.

## 2016-07-28 ENCOUNTER — Telehealth: Payer: Self-pay | Admitting: *Deleted

## 2016-07-28 NOTE — Telephone Encounter (Signed)
Pt left vm stating that since the decrease in her Lamictal, she's noticing more headaches and is feeling her depression return.

## 2016-07-28 NOTE — Telephone Encounter (Signed)
Ok to send abilifry 2mg  once a day #30 NRF over to start and continue tapering off lamictal that we discussed at office visit. Follow up in 4 weeks.

## 2016-07-29 ENCOUNTER — Other Ambulatory Visit: Payer: Self-pay | Admitting: *Deleted

## 2016-07-29 MED ORDER — ARIPIPRAZOLE 2 MG PO TABS: 2.0000 mg | ORAL_TABLET | Freq: Every day | ORAL | 0 refills | Status: DC

## 2016-07-29 NOTE — Telephone Encounter (Signed)
Pt notified of new rx.

## 2016-07-31 ENCOUNTER — Other Ambulatory Visit: Payer: Self-pay | Admitting: Physician Assistant

## 2016-07-31 DIAGNOSIS — G8929 Other chronic pain: Secondary | ICD-10-CM | POA: Diagnosis not present

## 2016-07-31 DIAGNOSIS — M47816 Spondylosis without myelopathy or radiculopathy, lumbar region: Secondary | ICD-10-CM | POA: Diagnosis not present

## 2016-08-01 ENCOUNTER — Other Ambulatory Visit: Payer: Self-pay | Admitting: Physician Assistant

## 2016-08-06 ENCOUNTER — Ambulatory Visit: Payer: BLUE CROSS/BLUE SHIELD | Admitting: Physician Assistant

## 2016-08-18 ENCOUNTER — Encounter: Payer: Self-pay | Admitting: Physician Assistant

## 2016-08-18 ENCOUNTER — Ambulatory Visit (INDEPENDENT_AMBULATORY_CARE_PROVIDER_SITE_OTHER): Payer: BLUE CROSS/BLUE SHIELD | Admitting: Physician Assistant

## 2016-08-18 VITALS — BP 110/69 | HR 105 | Ht <= 58 in | Wt 126.0 lb

## 2016-08-18 DIAGNOSIS — Z79899 Other long term (current) drug therapy: Secondary | ICD-10-CM

## 2016-08-18 DIAGNOSIS — F411 Generalized anxiety disorder: Secondary | ICD-10-CM | POA: Diagnosis not present

## 2016-08-18 DIAGNOSIS — M810 Age-related osteoporosis without current pathological fracture: Secondary | ICD-10-CM | POA: Diagnosis not present

## 2016-08-18 DIAGNOSIS — E039 Hypothyroidism, unspecified: Secondary | ICD-10-CM | POA: Diagnosis not present

## 2016-08-18 DIAGNOSIS — F3342 Major depressive disorder, recurrent, in full remission: Secondary | ICD-10-CM | POA: Diagnosis not present

## 2016-08-18 DIAGNOSIS — J4520 Mild intermittent asthma, uncomplicated: Secondary | ICD-10-CM | POA: Diagnosis not present

## 2016-08-18 MED ORDER — BUSPIRONE HCL 30 MG PO TABS: 30.0000 mg | ORAL_TABLET | Freq: Two times a day (BID) | ORAL | 5 refills | Status: DC

## 2016-08-18 MED ORDER — CLONAZEPAM 2 MG PO TABS: 3.0000 mg | ORAL_TABLET | Freq: Every day | ORAL | 5 refills | Status: DC

## 2016-08-18 MED ORDER — ARIPIPRAZOLE 2 MG PO TABS: 2.0000 mg | ORAL_TABLET | Freq: Every day | ORAL | 5 refills | Status: DC

## 2016-08-18 NOTE — Progress Notes (Signed)
   Subjective:    Patient ID: Leslie Roberson, female    DOB: 1956-01-14, 61 y.o.   MRN: [id number]  HPI  Pt is a 61 yo female who presents to the clinic to follow up on abilify. We switched her to abilify after she complained of lamictal causing her to have a bad taste in her mouth.she does not have any more of a bad taste. She is on 100mg  of lamictal at this time. She will start 75mg  next week and taper down from there.  She is currently on 2mg  and feels like it is really helping with depression. Her mood is great.    Review of Systems  All other systems reviewed and are negative.      Objective:   Physical Exam  Constitutional: She is oriented to person, place, and time. She appears well-developed and well-nourished.  HENT:  Head: Normocephalic and atraumatic.  Cardiovascular: Normal rate, regular rhythm and normal heart sounds.   Pulmonary/Chest: Effort normal and breath sounds normal.  Neurological: She is alert and oriented to person, place, and time.  Psychiatric: She has a normal mood and affect. Her behavior is normal.          Assessment & Plan:  Marland KitchenMarland KitchenDiagnoses and all orders for this visit:  MDD (major depressive disorder), recurrent, in full remission ([place]) -     ARIPiprazole (ABILIFY) 2 MG tablet; Take 1 tablet (2 mg total) by mouth daily. -     busPIRone (BUSPAR) 30 MG tablet; Take 1 tablet (30 mg total) by mouth 2 (two) times daily.  Medication management -     COMPLETE METABOLIC PANEL WITH GFR  Hypothyroidism, unspecified type -     TSH  GAD (generalized anxiety disorder) -     ARIPiprazole (ABILIFY) 2 MG tablet; Take 1 tablet (2 mg total) by mouth daily. -     busPIRone (BUSPAR) 30 MG tablet; Take 1 tablet (30 mg total) by mouth 2 (two) times daily. -     clonazePAM (KLONOPIN) 2 MG tablet; Take 1.5 tablets (3 mg total) by mouth daily.  Mild intermittent asthma without complication  Osteoporosis, unspecified osteoporosis type, unspecified pathological  fracture presence -     COMPLETE METABOLIC PANEL WITH GFR   Labs ordered for prolia shot due in June. Last shot 12/11.   Continue to try to taper off lamictal and still keep depression and anxiety under control. We could consider increasing abilify if we need to or staying on abilify and lamictal low dose.

## 2016-08-18 NOTE — Patient Instructions (Signed)
Next prolia June 11th.

## 2016-08-21 DIAGNOSIS — E039 Hypothyroidism, unspecified: Secondary | ICD-10-CM | POA: Diagnosis not present

## 2016-08-21 DIAGNOSIS — G8929 Other chronic pain: Secondary | ICD-10-CM | POA: Diagnosis not present

## 2016-08-21 DIAGNOSIS — Z79899 Other long term (current) drug therapy: Secondary | ICD-10-CM | POA: Diagnosis not present

## 2016-08-21 DIAGNOSIS — M47816 Spondylosis without myelopathy or radiculopathy, lumbar region: Secondary | ICD-10-CM | POA: Diagnosis not present

## 2016-08-21 DIAGNOSIS — M419 Scoliosis, unspecified: Secondary | ICD-10-CM | POA: Diagnosis not present

## 2016-08-21 LAB — COMPLETE METABOLIC PANEL WITH GFR
ALT: 32 U/L — AB (ref 6–29)
AST: 23 U/L (ref 10–35)
Albumin: 4.1 g/dL (ref 3.6–5.1)
Alkaline Phosphatase: 61 U/L (ref 33–130)
BUN: 20 mg/dL (ref 7–25)
CALCIUM: 9.2 mg/dL (ref 8.6–10.4)
CHLORIDE: 107 mmol/L (ref 98–110)
CO2: 26 mmol/L (ref 20–31)
Creat: 0.89 mg/dL (ref 0.50–0.99)
GFR, Est African American: 81 mL/min (ref >=60)
GFR, Est Non African American: 71 mL/min (ref >=60)
GLUCOSE: 105 mg/dL — AB (ref 65–99)
Potassium: 4.7 mmol/L (ref 3.5–5.3)
SODIUM: 141 mmol/L (ref 135–146)
Total Bilirubin: 0.3 mg/dL (ref 0.2–1.2)
Total Protein: 6.4 g/dL (ref 6.1–8.1)

## 2016-08-22 ENCOUNTER — Other Ambulatory Visit: Payer: Self-pay | Admitting: Physician Assistant

## 2016-08-22 DIAGNOSIS — M810 Age-related osteoporosis without current pathological fracture: Secondary | ICD-10-CM

## 2016-08-22 LAB — TSH: TSH: 1.93 m[IU]/L

## 2016-08-22 NOTE — Progress Notes (Signed)
Call pt: thyroid looks great. Send refills for one year please.   CMP ordered for prolia in June. Will you order prolia and get appt scheduled.

## 2016-08-27 ENCOUNTER — Ambulatory Visit: Payer: BLUE CROSS/BLUE SHIELD | Admitting: Physician Assistant

## 2016-08-31 ENCOUNTER — Other Ambulatory Visit: Payer: Self-pay | Admitting: Physician Assistant

## 2016-09-01 ENCOUNTER — Encounter: Payer: Self-pay | Admitting: Family Medicine

## 2016-09-01 ENCOUNTER — Ambulatory Visit (INDEPENDENT_AMBULATORY_CARE_PROVIDER_SITE_OTHER): Payer: BLUE CROSS/BLUE SHIELD | Admitting: Family Medicine

## 2016-09-01 ENCOUNTER — Ambulatory Visit (INDEPENDENT_AMBULATORY_CARE_PROVIDER_SITE_OTHER): Payer: BLUE CROSS/BLUE SHIELD

## 2016-09-01 VITALS — BP 130/77 | HR 97 | Wt 127.0 lb

## 2016-09-01 DIAGNOSIS — M47897 Other spondylosis, lumbosacral region: Secondary | ICD-10-CM | POA: Diagnosis not present

## 2016-09-01 DIAGNOSIS — M545 Low back pain, unspecified: Secondary | ICD-10-CM

## 2016-09-01 DIAGNOSIS — W19XXXA Unspecified fall, initial encounter: Secondary | ICD-10-CM

## 2016-09-01 DIAGNOSIS — M41125 Adolescent idiopathic scoliosis, thoracolumbar region: Secondary | ICD-10-CM | POA: Diagnosis not present

## 2016-09-01 DIAGNOSIS — M4185 Other forms of scoliosis, thoracolumbar region: Secondary | ICD-10-CM | POA: Diagnosis not present

## 2016-09-01 DIAGNOSIS — M47817 Spondylosis without myelopathy or radiculopathy, lumbosacral region: Secondary | ICD-10-CM

## 2016-09-01 DIAGNOSIS — S3992XA Unspecified injury of lower back, initial encounter: Secondary | ICD-10-CM | POA: Diagnosis not present

## 2016-09-01 IMAGING — DX DG THORACOLUMBAR SPINE 2V
2 series · 2 of 2 positions shown · non-contrast
Comparison: Chest x-ray 07/11/2016

CLINICAL DATA: Fall on back 08/23/2016.  Mid back pain.

EXAM:
THORACOLUMBAR SPINE 1V

[tl-spine ap]
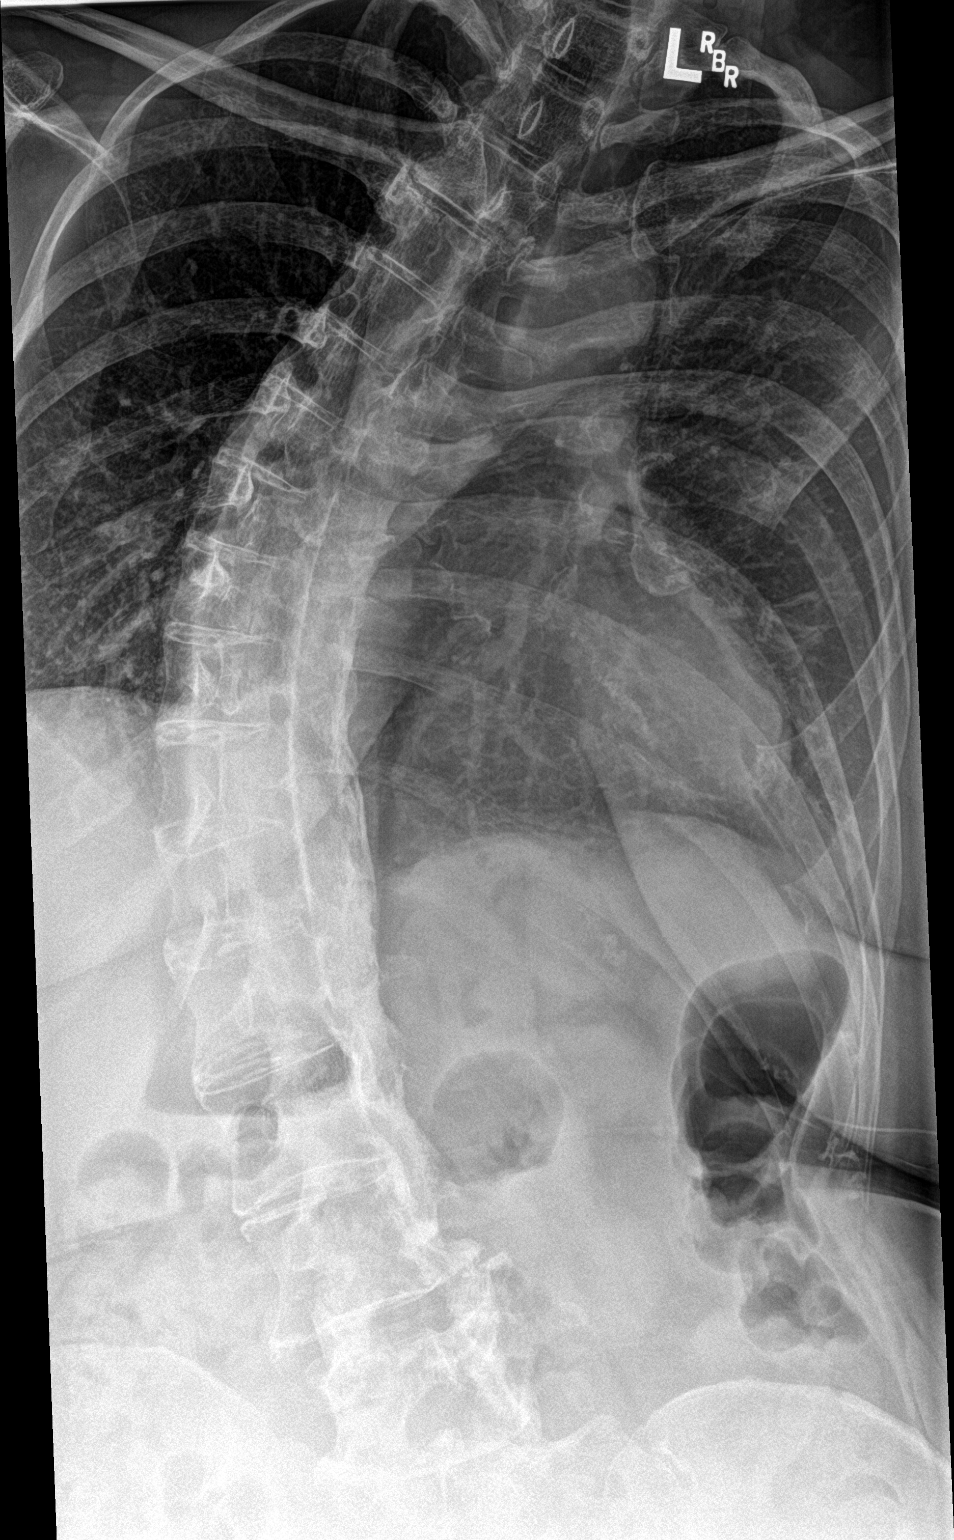

[tl-spine lat]
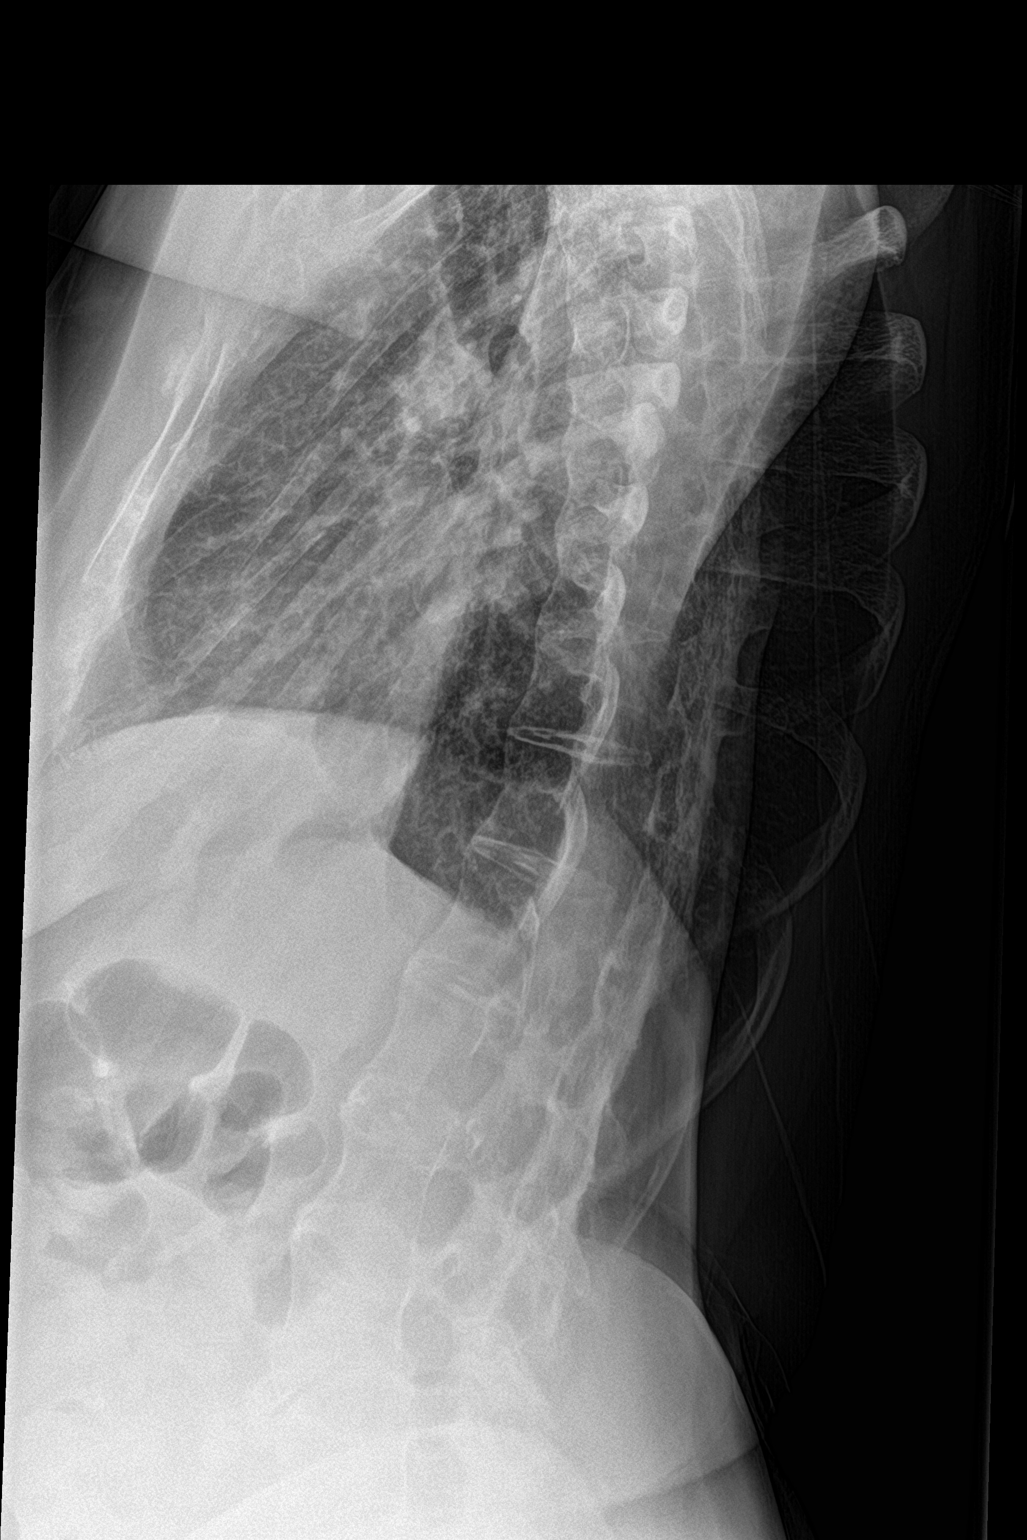

[2 of 2 positions shown; findings below may reference images not displayed]

FINDINGS: Severe rightward thoracolumbar scoliosis. No fracture subluxation.
No change since prior study. Visualized lungs are clear.
IMPRESSION: Severe rightward thoracolumbar scoliosis. No acute bony abnormality.

## 2016-09-01 NOTE — Patient Instructions (Signed)
Thank you for coming in today. You should hear from the [facility] about the blocks in prep for ablation.   Let me know if you do not hear anything,   Get xray today.

## 2016-09-01 NOTE — Progress Notes (Signed)
Leslie Roberson is a 61 y.o. female who presents to [facility] today for follow-up back pain. Patient has chronic back pain. She has a history of scoliosis that has developed into degenerative process. When she was living in a different state she had success with lumbar ablation at L4-L5 and L5-S1 bilaterally. The pain has returned. She was referred to comprehensive pain specialist but was not satisfied with the approach.  She does not want to take chronic opiates at this time and is looking to see if she benefit from an ablation. She's had facet injections in the past which do not last very long. She notes chronic back pain limiting her ability to exercise normally. She denies significant radiating pain.  Additionally patient notes a recent fall. She fell landing on her back about 2 weeks ago. She does note some pain in the mid lower back that is a little bit worse than usual. She denies any radiating pain weakness or numbness fevers or chills.   Past Medical History:  Diagnosis Date  . Anxiety   . Asthma, chronic 11/27/2015  . Depression   . Diverticulitis of colon 12/21/2015   Colonoscopy every 5 years/digestive health.   . GERD (gastroesophageal reflux disease) 11/27/2015  . Idiopathic scoliosis 01/22/2016  . Osteoporosis 11/27/2015   On prolia.   . Thyroid disease    Past Surgical History:  Procedure Laterality Date  . ABDOMINAL HYSTERECTOMY    . bladder mesh    . ELBOW SURGERY Right   . SPINAL FUSION    . TONSILLECTOMY    . TOTAL HIP ARTHROPLASTY     Social History  Substance Use Topics  . Smoking status: Never Smoker  . Smokeless tobacco: Never Used  . Alcohol use Yes     ROS:  As above   Medications: Current Outpatient Prescriptions  Medication Sig Dispense Refill  . albuterol (PROVENTIL HFA;VENTOLIN HFA) 108 (90 Base) MCG/ACT inhaler Inhale 1-2 puffs into the lungs every 4 (four) hours as needed for wheezing or  shortness of breath. 1 Inhaler 3  . ARIPiprazole (ABILIFY) 2 MG tablet Take 1 tablet (2 mg total) by mouth daily. 30 tablet 5  . busPIRone (BUSPAR) 30 MG tablet Take 1 tablet (30 mg total) by mouth 2 (two) times daily. 60 tablet 5  . Cholecalciferol (VITAMIN D PO) Take 1 tablet by mouth 2 (two) times daily.    . clonazePAM (KLONOPIN) 2 MG tablet Take 1.5 tablets (3 mg total) by mouth daily. 45 tablet 5  . cycloSPORINE (RESTASIS) 0.05 % ophthalmic emulsion Place 1 drop into both eyes 2 (two) times daily.    Marland Kitchen denosumab (PROLIA) 60 MG/ML SOLN injection Inject 60 mg into the skin every 6 (six) months. Administer in upper arm, thigh, or abdomen    . desvenlafaxine (PRISTIQ) 100 MG 24 hr tablet TAKE 1 TABLET BY MOUTH DAILY 30 tablet 4  . desvenlafaxine (PRISTIQ) 50 MG 24 hr tablet TAKE 1 TABLET BY MOUTH DAILY 30 tablet 4  . DEXILANT 60 MG capsule TAKE 1 CAPSULE (60 MG) BY MOUTH DAILY 30 capsule 3  . diclofenac sodium (VOLTAREN) 1 % GEL APPLY 4G TOPICALLY 4 TIMES DAILY 200 g 2  . flunisolide (NASAREL) 29 MCG/ACT (0.025%) nasal spray Place 2 sprays into the nose daily as needed for rhinitis. Dose is for each nostril.    Marland Kitchen gabapentin (NEURONTIN) 300 MG capsule One tab PO qHS for a week, then BID for a week, then TID.  May double weekly to a max of 3,600mg /day 180 capsule 3  . lamoTRIgine (LAMICTAL) 25 MG tablet Start 3 tablets for 1 week, then start 2 tablets for 1 weeks, and then 1 tablet for 1 week. 37 tablet 0  . levothyroxine (SYNTHROID, LEVOTHROID) 125 MCG tablet TAKE ONE-HALF TABLET BY MOUTH ONCE DAILY 30 tablet 2  . metroNIDAZOLE (METROGEL) 1 % gel Apply topically daily. 45 g 0  . naproxen sodium (ANAPROX) 220 MG tablet Take 220 mg by mouth as needed.    . Omega-3 Fatty Acids (FISH OIL PO) Take 1 tablet by mouth daily.    . traZODone (DESYREL) 50 MG tablet TAKE 1 TABLET (50 MG TOTAL) BY MOUTH AT BEDTIME. 90 tablet 1  . triamcinolone ointment (KENALOG) 0.1 % Apply 1 application topically 2 (two)  times daily. To affected area(s) as needed. Use no longer than 1-2 weeks. 30 g 0   No current facility-administered medications for this visit.    Allergies  Allergen Reactions  . Adhesive [Tape]   . Demerol [Meperidine]   . Dilaudid [Hydromorphone]   . Levofloxacin      Exam:  BP 130/77   Pulse 97   Wt 127 lb (57.6 kg)   BMI 26.54 kg/m  General: Well Developed, well nourished, and in no acute distress.  Neuro/Psych: Alert and oriented x3, extra-ocular muscles intact, able to move all 4 extremities, sensation grossly intact. Skin: Warm and dry, no rashes noted.  Respiratory: Not using accessory muscles, speaking in full sentences, trachea midline.  Cardiovascular: Pulses palpable, no extremity edema. Abdomen: Does not appear distended. MSK:  Spine: Significant scoliosis present. Point tender to palpation at around T12 area along midline. Otherwise nontender to spinal midline. Tender to palpation bilateral lumbar paraspinal muscles. Decreased lumbar motion due to scoliosis and pain.   X-ray-L-spine pending    No results found for this or any previous visit (from the past 48 hour(s)). No results found.    Assessment and Plan: 61 y.o. 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 Plan for medial branch block and preparation for ablation had bilateral L4-L5 level and L5-S1 levels referred to [facility] radiology. Will send copy of MRI report and last ablation note with referral.   As for recent injury and doubtful for a vertebral compression fracture but will obtain x-ray of the thoracic and lumbar spine to evaluate for this possibility today.      Orders Placed This Encounter  Procedures  . DG THORACOLUMABAR SPINE    Standing Status:   Future    Number of Occurrences:   1    Standing Expiration Date:   11/01/2017    Order Specific Question:   Reason for Exam (SYMPTOM  OR DIAGNOSIS REQUIRED)    Answer:   eval pain following fall at around t12 or L1 area    Order  Specific Question:   Is patient pregnant?    Answer:   No    Order Specific Question:   Preferred imaging location?    Answer:   [facility]    Order Specific Question:   Radiology Contrast Protocol - do NOT remove file path    Answer:   [email or web address]  . DG Facet Jt Neuro Destruct Sing L/S w/Img Guide    Order Specific Question:   Reason for exam:    Answer:   BL L4-5 and L5-S1 Need confirmatory medial branch block, and if responsive, radiofrequency ablation of:    Order Specific Question:   Is  the patient pregnant?    Answer:   No    Order Specific Question:   Preferred imaging location?    Answer:   [facility]  . DG Facet Jt Neuro Destruct Sing L/S w/Img Guide    Order Specific Question:   Reason for exam:    Answer:   BL L4-5 and L5-S1  Need confirmatory medial branch block, and if responsive, radiofrequency ablation of:    Order Specific Question:   Is the patient pregnant?    Answer:   No    Order Specific Question:   Preferred imaging location?    Answer:   [facility]  . DG Facet Jt Neuro Destruct Sing L/S w/Img Guide    Order Specific Question:   Reason for exam:    Answer:   BL L4-5 and L5-S1 Need confirmatory medial branch block, and if responsive, radiofrequency ablation of:    Order Specific Question:   Is the patient pregnant?    Answer:   No    Order Specific Question:   Preferred imaging location?    Answer:   [facility]  . DG Facet Jt Neuro Destruct Sing L/S w/Img Guide    Order Specific Question:   Reason for exam:    Answer:   BL L4-5 and L5-S1 Need confirmatory medial branch block, and if responsive, radiofrequency ablation of:    Order Specific Question:   Is the patient pregnant?    Answer:   No    Order Specific Question:   Preferred imaging location?    Answer:   [facility]   No orders of the defined types were placed in this encounter.   Discussed warning signs or  symptoms. Please see discharge instructions. Patient expresses understanding.  I spent 40 minutes with this patient, greater than 50% was face-to-face time counseling regarding the above diagnosis.

## 2016-09-04 ENCOUNTER — Telehealth: Payer: Self-pay

## 2016-09-04 NOTE — Telephone Encounter (Signed)
Pt stated that she has not received a call from [facility], and I asked Anderson Malta about a referral, and we did not see one.  Also, pt's knee is still bothering her and wanted to know if she needs an appointment.  Please advise.

## 2016-09-05 ENCOUNTER — Other Ambulatory Visit: Payer: Self-pay | Admitting: Family Medicine

## 2016-09-05 DIAGNOSIS — M47817 Spondylosis without myelopathy or radiculopathy, lumbosacral region: Secondary | ICD-10-CM

## 2016-09-05 NOTE — Telephone Encounter (Signed)
Orders are in under radiology. I have printed them again and place them in Leslie Roberson's in basket to be called and scheduled

## 2016-09-08 ENCOUNTER — Ambulatory Visit (INDEPENDENT_AMBULATORY_CARE_PROVIDER_SITE_OTHER): Payer: BLUE CROSS/BLUE SHIELD | Admitting: Family Medicine

## 2016-09-08 VITALS — BP 130/76 | HR 99

## 2016-09-08 DIAGNOSIS — M810 Age-related osteoporosis without current pathological fracture: Secondary | ICD-10-CM

## 2016-09-08 DIAGNOSIS — M7041 Prepatellar bursitis, right knee: Secondary | ICD-10-CM

## 2016-09-08 MED ORDER — DENOSUMAB 60 MG/ML ~~LOC~~ SOLN
60.0000 mg | Freq: Once | SUBCUTANEOUS | Status: AC
  Administered 2016-09-08: 60 mg via SUBCUTANEOUS

## 2016-09-08 NOTE — Progress Notes (Signed)
Prolia given [place] in left arm.

## 2016-09-08 NOTE — Progress Notes (Signed)
Leslie Roberson is a 61 y.o. female who presents to [facility] today for right knee pain. Patient notes right knee pain present for about 2 weeks. She several months out from right arthroscopic knee surgery. She notes pain at the anterior aspect of the knee worse with kneeling and flexion. She denies any swelling locking or catching or giving way. She denies any fevers or chills. She has been using topical diclofenac gel which has helped.   Past Medical History:  Diagnosis Date  . Anxiety   . Asthma, chronic 11/27/2015  . Depression   . Diverticulitis of colon 12/21/2015   Colonoscopy every 5 years/digestive health.   . GERD (gastroesophageal reflux disease) 11/27/2015  . Idiopathic scoliosis 01/22/2016  . Osteoporosis 11/27/2015   On prolia.   . Thyroid disease    Past Surgical History:  Procedure Laterality Date  . ABDOMINAL HYSTERECTOMY    . bladder mesh    . ELBOW SURGERY Right   . SPINAL FUSION    . TONSILLECTOMY    . TOTAL HIP ARTHROPLASTY     Social History  Substance Use Topics  . Smoking status: Never Smoker  . Smokeless tobacco: Never Used  . Alcohol use Yes     ROS:  As above   Medications: Current Outpatient Prescriptions  Medication Sig Dispense Refill  . albuterol (PROVENTIL HFA;VENTOLIN HFA) 108 (90 Base) MCG/ACT inhaler Inhale 1-2 puffs into the lungs every 4 (four) hours as needed for wheezing or shortness of breath. 1 Inhaler 3  . ARIPiprazole (ABILIFY) 2 MG tablet Take 1 tablet (2 mg total) by mouth daily. 30 tablet 5  . busPIRone (BUSPAR) 30 MG tablet Take 1 tablet (30 mg total) by mouth 2 (two) times daily. 60 tablet 5  . Cholecalciferol (VITAMIN D PO) Take 1 tablet by mouth 2 (two) times daily.    . clonazePAM (KLONOPIN) 2 MG tablet Take 1.5 tablets (3 mg total) by mouth daily. 45 tablet 5  . cycloSPORINE (RESTASIS) 0.05 % ophthalmic emulsion Place 1 drop into both eyes 2 (two) times daily.    Marland Kitchen  denosumab (PROLIA) 60 MG/ML SOLN injection Inject 60 mg into the skin every 6 (six) months. Administer in upper arm, thigh, or abdomen    . desvenlafaxine (PRISTIQ) 100 MG 24 hr tablet TAKE 1 TABLET BY MOUTH DAILY 30 tablet 4  . desvenlafaxine (PRISTIQ) 50 MG 24 hr tablet TAKE 1 TABLET BY MOUTH DAILY 30 tablet 4  . DEXILANT 60 MG capsule TAKE 1 CAPSULE (60 MG) BY MOUTH DAILY 30 capsule 3  . diclofenac sodium (VOLTAREN) 1 % GEL APPLY 4G TOPICALLY 4 TIMES DAILY 200 g 2  . flunisolide (NASAREL) 29 MCG/ACT (0.025%) nasal spray Place 2 sprays into the nose daily as needed for rhinitis. Dose is for each nostril.    Marland Kitchen gabapentin (NEURONTIN) 300 MG capsule One tab PO qHS for a week, then BID for a week, then TID. May double weekly to a max of 3,600mg /day 180 capsule 3  . lamoTRIgine (LAMICTAL) 25 MG tablet Start 3 tablets for 1 week, then start 2 tablets for 1 weeks, and then 1 tablet for 1 week. 37 tablet 0  . levothyroxine (SYNTHROID, LEVOTHROID) 125 MCG tablet TAKE ONE-HALF TABLET BY MOUTH ONCE DAILY 30 tablet 2  . metroNIDAZOLE (METROGEL) 1 % gel Apply topically daily. 45 g 0  . naproxen sodium (ANAPROX) 220 MG tablet Take 220 mg by mouth as needed.    . Omega-3  Fatty Acids (FISH OIL PO) Take 1 tablet by mouth daily.    . traZODone (DESYREL) 50 MG tablet TAKE 1 TABLET (50 MG TOTAL) BY MOUTH AT BEDTIME. 90 tablet 1  . triamcinolone ointment (KENALOG) 0.1 % Apply 1 application topically 2 (two) times daily. To affected area(s) as needed. Use no longer than 1-2 weeks. 30 g 0   No current facility-administered medications for this visit.    Allergies  Allergen Reactions  . Adhesive [Tape]   . Demerol [Meperidine]   . Dilaudid [Hydromorphone]   . Levofloxacin      Exam:  BP 130/76   Pulse 99  General: Well Developed, well nourished, and in no acute distress.  Neuro/Psych: Alert and oriented x3, extra-ocular muscles intact, able to move all 4 extremities, sensation grossly intact. Skin: Warm and  dry, no rashes noted.  Respiratory: Not using accessory muscles, speaking in full sentences, trachea midline.  Cardiovascular: Pulses palpable, no extremity edema. Abdomen: Does not appear distended. MSK: Right knee no effusion no erythema otherwise normal-appearing Normal range of motion with mild retropatellar crepitations. Nontender throughout but with palpable squeak overlying the patella tendon. Intact flexion and extension strength. Stable ligamentous exam.  Limited musculoskeletal ultrasound reveals an intact patellar tendon and quadriceps tendon with no effusion. There is a small amount of fluid tracking along the superior shoulder soft tissue overlying the patellar tendon. This indicates prepatellar bursitis. No increased vascular activity is present.    No results found for this or any previous visit (from the past 48 hour(s)). No results found.    Assessment and Plan: 61 y.o. female with right anterior knee. Think is prepatellar bursitis. There may be breaking up of adhesions of the portal scar at half his fat pad as well. Plan for eccentric exercises and diclofenac gel and watchful waiting. Recheck if not improving.    No orders of the defined types were placed in this encounter.  No orders of the defined types were placed in this encounter.   Discussed warning signs or symptoms. Please see discharge instructions. Patient expresses understanding.

## 2016-09-08 NOTE — Patient Instructions (Signed)
Thank you for coming in today. Apply the Voltaren gel  Work on knee motion.  Continue exercising with the exercise bike. Use the knee extension machine with two legs and let the one leg go down slowly against resistance.    Prepatellar Bursitis Prepatellar bursitis is inflammation of the prepatellar bursa. The prepatellar bursa is a fluid-filled sac that cushions the kneecap (patella). Prepatellar bursitis happens when fluid builds up in the this sac and causes it to get larger. The condition causes knee pain. What are the causes? This condition may be caused by:  Constant pressure on the knees from kneeling.  A hit to the knee.  Falling on the knee.  A bacterial infection.  Moving the knee often in a forceful way.  What increases the risk? This condition is more likely to develop in:  People who play a sport that involves a risk for falls on the knee or hard hits (blows) to the knee. These sports include: ? Football. ? Wrestling. ? Basketball. ? Soccer.  People who have to kneel for long periods of time, such as roofers, plumbers, and gardeners.  People with another inflammatory condition, such as gout or rheumatoid arthritis.  What are the signs or symptoms? The most common symptom of this condition is knee pain that gets better with rest. Other symptoms include:  Swelling on the front of the kneecap.  Warmth in the knee.  Tenderness with activity.  Redness in the knee.  Inability to bend the knee or to kneel.  How is this diagnosed? This condition may be diagnosed based on:  Your symptoms.  Your medical history.  A physical exam. During the exam, your provider will compare your knees and check for tenderness and pain when moving your knee. Your health care provider may also use a needle to remove fluid from the bursa to help diagnose an infection.  Tests, such as: ? A blood test that checks for infection. ? X-rays. These may be taken to check the  structure of the patella. ? MRI or ultrasound. These may be done to check for swelling and fluid buildup in the bursa.  How is this treated? This condition may be treated by:  Resting the knee.  Applying ice to the knee.  Medicine, such as: ? Nonsteroidal anti-inflammatory drugs (NSAIDs). These can help to reduce pain and swelling. ? Antibiotic medicines. These may be needed if you have an infection. ? Steroid medicines. These may be prescribed if other treatments are not helping.  Raising (elevating) the knee while resting.  Doing strengthening and stretching exercises (physical therapy). These may be recommended after pain and swelling improve.  Having a procedure to remove fluid from the bursa. This may be done if other treatment is not helping.  Having surgery to remove the bursa. This may be done if you have a severe infection or if the condition keeps coming back after treatment.  Follow these instructions at home: Medicines  Take over-the-counter and prescription medicines only as told by your health care provider.  If you were prescribed an antibiotic medicine, take it as told by your health care provider. Do not stop taking the antibiotic even if you start to feel better. Managing pain, stiffness, and swelling  If directed, apply ice to your knee. ? Put ice in a plastic bag. ? Place a towel between your skin and the bag. ? Leave the ice on for 20 minutes, 2-3 times a day.  Elevate your knee above the level of  your heart while you are sitting or lying down. Driving  Do not drive or operate heavy machinery while taking prescription pain medicine.  Ask your health care provider when it is safe fpr you to drive. Activity  Rest your knee.  Avoid activities that cause pain.  Return to your normal activities as told by your health care provider. Ask your health care provider what activities are safe for you.  Do exercises as told by your health care  provider. General instructions  Do not use the injured limb to support your body weight until your health care provider says that you can.  Do not use any tobacco products, such as cigarettes, chewing tobacco, and e-cigarettes. Tobacco can delay bone healing. If you need help quitting, ask your health care provider.  Keep all follow-up visits as told by your health care provider. This is important. How is this prevented?  Warm up and stretch before being active.  Cool down and stretch after being active.  Give your body time to rest between periods of activity.  Make sure to use equipment that fits you.  Be safe and responsible while being active to avoid falls.  Do at least 150 minutes of moderate-intensity exercise each week, such as brisk walking or water aerobics.  Maintain physical fitness, including: ? Strength. ? Flexibility. ? Cardiovascular fitness. ? Endurance. Contact a health care provider if:  Your symptoms do not improve.  Your symptoms get worse.  Your symptoms keep coming back after treatment.  You develop a fever and have warmth, redness, and swelling over your knee. This information is not intended to replace advice given to you by your health care provider. Make sure you discuss any questions you have with your health care provider. Document Released: 03/17/2005 Document Revised: 11/20/2015 Document Reviewed: 12/15/2014 Elsevier Interactive Patient Education  2018 [place] [facility] Ask your health care provider which exercises are safe for you. Do exercises exactly as told by your health care provider and adjust them as directed. It is normal to feel mild stretching, pulling, tightness, or discomfort as you do these exercises, but you should stop right away if you feel sudden pain or your pain gets worse.Do not begin these exercises until told by your health care provider. Stretching and range of motion exercises These  exercises warm up your muscles and joints and improve the movement and flexibility of your knee. These exercises also help to relieve pain, numbness, and tingling. Exercise A: Hamstring, standing  1. Stand with your __________ foot resting on a chair. Your __________ leg should be fully extended. 2. Arch your lower back slightly. 3. Leading with your chest, lean forward at the waist until you feel a gentle stretch in the back of your __________ knee or in your thigh. You should not need to lean far to feel a stretch. 4. Hold this position for __________ seconds. Repeat __________ times. Complete this stretch __________ times a day. Exercise B: Knee flexion, active heel slides 1. Lie on your back with both knees straight. If this causes back discomfort, bend your __________ knee so your foot is flat on the floor. 2. Slowly slide your __________ heel back toward your buttocks until you feel a gentle stretch in the front of your knee or thigh. 3. Hold this position for __________ seconds. 4. Slowly slide your __________ heel back to the starting position. Repeat __________ times. Complete this stretch __________ times a day. Strengthening exercises These exercises build strength  and endurance in your knee. Endurance is the ability to use your muscles for a long time, even after they get tired. Exercise C: Quadriceps, isometric  1. Lie on your back with your __________ leg extended and your __________ knee bent. 2. Slowly tense the muscles in the front of your __________ thigh. When you do this, you should see your kneecap slide up toward your hip or see increased dimpling just above the knee. This motion will push the back of your knee down toward the surface that is under it. 3. For __________ seconds, keep the muscle as tight as you can without increasing your pain. 4. Relax the muscles slowly and completely. Repeat __________ times. Complete this exercise __________ times a day. Exercise D:  Straight leg raises ( quadriceps) 1. Lie on your back with your __________ leg extended and your __________ knee bent. 2. Slowly tense the muscles in your __________ thigh. When you do this, you should see your kneecap slide up toward your hip or see increased dimpling just above the knee. 3. Keep these muscles tight as you raise your leg 4-6 inches (10-15 cm) off the floor. 4. Hold this position for __________ seconds. 5. Keep these muscles tense as you lower your leg slowly. 6. Relax your muscles slowly and completely. Repeat __________ times. Complete this exercise __________ times a day. Exercise E: Straight leg raises ( hip extensors) 1. Lie on your abdomen on a bed or a firm surface. You can put a pillow under your hips if that is more comfortable. 2. Tense your buttock muscles and lift your __________ thigh. Your __________ knee can be bent or straight, but do not let your back arch. 3. Hold this position for __________ seconds. 4. Slowly lower your leg to the starting position. 5. Let your muscles relax completely. Repeat __________ times. Complete this exercise __________ times a day. This information is not intended to replace advice given to you by your health care provider. Make sure you discuss any questions you have with your health care provider. Document Released: 03/17/2005 Document Revised: 11/20/2015 Document Reviewed: 12/15/2014 Elsevier Interactive Patient Education  [employer].

## 2016-09-12 ENCOUNTER — Other Ambulatory Visit: Payer: Self-pay | Admitting: Family Medicine

## 2016-09-12 ENCOUNTER — Ambulatory Visit
Admission: RE | Admit: 2016-09-12 | Discharge: 2016-09-12 | Disposition: A | Discharge status: Home / Self Care | Payer: BLUE CROSS/BLUE SHIELD | Source: Ambulatory Visit | Attending: Family Medicine | Admitting: Family Medicine

## 2016-09-12 DIAGNOSIS — M47817 Spondylosis without myelopathy or radiculopathy, lumbosacral region: Secondary | ICD-10-CM

## 2016-09-12 DIAGNOSIS — M545 Low back pain: Secondary | ICD-10-CM | POA: Diagnosis not present

## 2016-09-12 IMAGING — XA DG FACET JT INJ L OR S SPINE 2ND LEVEL *R*
2 series · 2 of 2 positions shown · non-contrast
Comparison: none

ADDENDUM:
The patient had an excellent durable relief to medial branch blocks.
She will be scheduled for RF ablation as soon as possible.
CLINICAL DATA: Facet mediated lumbago. Adjacent segment disease.
Previous non-instrumented fusion for scoliosis.

EXAM:
BILATERAL L3 AND BILATERAL L4 MEDIAL BRANCH BLOCKS ALONG WITH
BILATERAL L5 DORSAL PRIMARY RAMI BLOCKS UNDER FLUOROSCOPY
FLUOROSCOPY TIME:  42 SECONDS corresponding to a Dose Area Product
of 77 ?Gy*m2
TECHNIQUE: An appropriate skin entry site was determined fluoroscopically and
marked. Operator donned sterile gloves and mask. Site prepped with
betadine, draped in usual sterile fashion, and infiltrated locally
with 1% lidocaine.

[Series 1: ortho standard · 1 of 1 slices shown (1 of 2)]
[im 1/1]
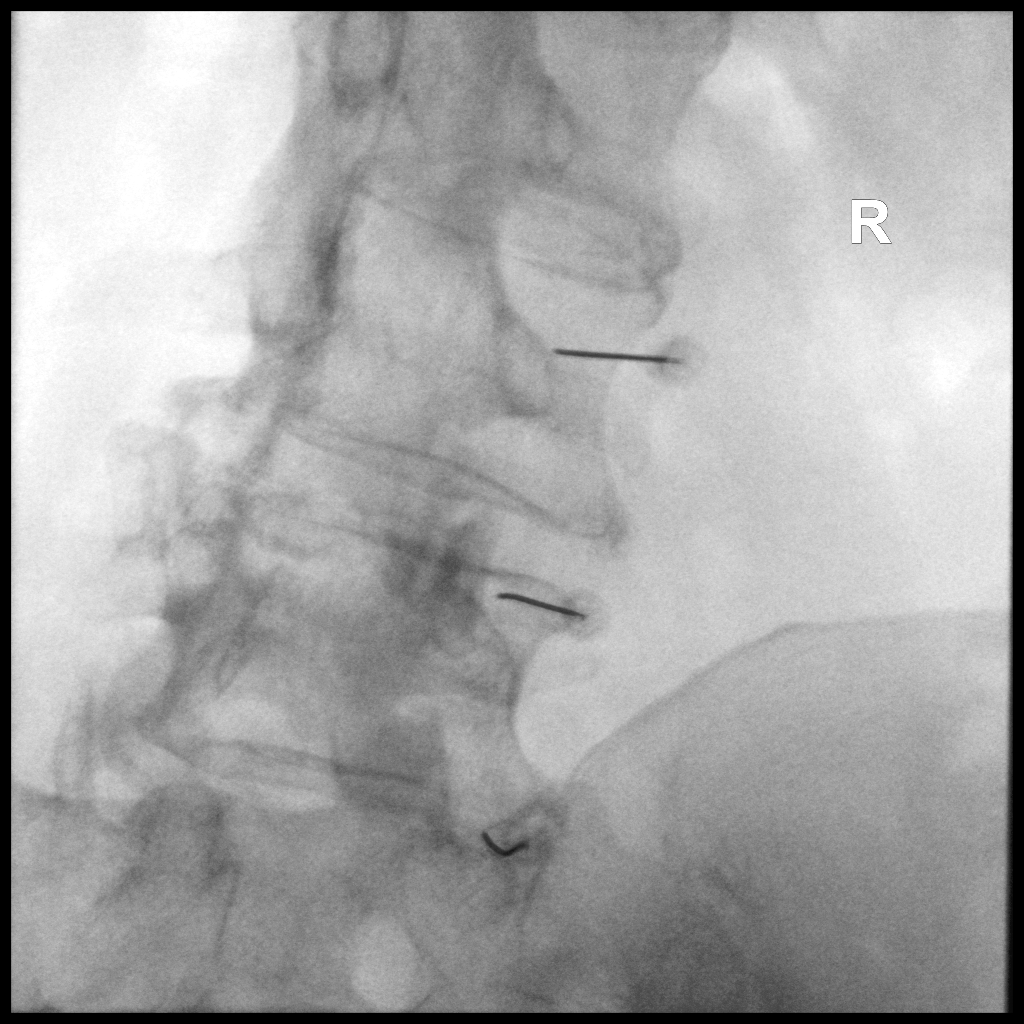

[Series 2: ortho standard · 1 of 1 slices shown (2 of 2)]
[im 1/1]
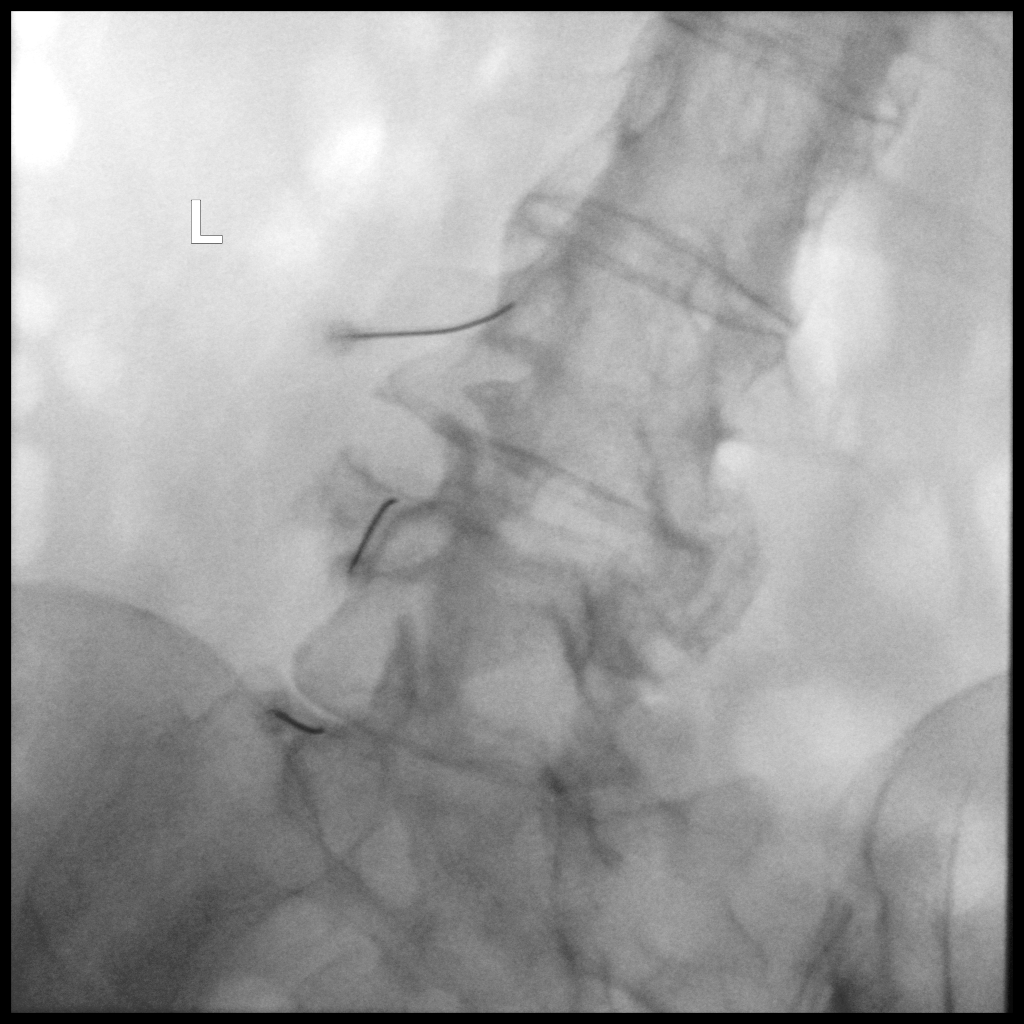

[2 of 2 positions shown; findings below may reference images not displayed]

A 22 gauge spinal needle was advanced to the posterior margin RIGHT
L4 pedicle at the junction of the superior articular process and
transverse process. Needle tip was confirmed to be dorsal to the
foramen. 1.5 mL of 0.25% Sensorcaine was administered around the
medial branch.

An identical procedure was performed at the LEFT L4 pedicle, RIGHT
L5 pedicle, and LEFT L5 pedicle. At each location, 1.5 mL of 0.25%
Sensorcaine was administered around each medial branch.

For dorsal primary rami blocks, a 22 gauge spinal needle was placed
at the notch formed by the RIGHT and LEFT S1 superior articular
process and sacral ala. At each location, 1.5 mL of 0.25%
Sensorcaine was administered.

Postprocedure, the patient was significantly improved. I will call
patient later today to assess for durable relief and an addendum
will be placed.
IMPRESSION: 1. Technically successful medial branch blocks and dorsal primary
ramus blocks in preparation for BILATERAL L4-5 and L5-S1 facet
denervation. See discussion above.

## 2016-09-12 MED ORDER — IOPAMIDOL (ISOVUE-M 200) INJECTION 41%: 1.0000 mL | Freq: Once | INTRAMUSCULAR | Status: DC

## 2016-09-12 NOTE — Discharge Instructions (Signed)

## 2016-09-13 ENCOUNTER — Other Ambulatory Visit: Payer: Self-pay | Admitting: Physician Assistant

## 2016-09-15 ENCOUNTER — Encounter: Payer: Self-pay | Admitting: Physician Assistant

## 2016-09-15 ENCOUNTER — Other Ambulatory Visit: Payer: Self-pay | Admitting: *Deleted

## 2016-09-15 ENCOUNTER — Other Ambulatory Visit: Payer: Self-pay | Admitting: Family Medicine

## 2016-09-15 ENCOUNTER — Ambulatory Visit (INDEPENDENT_AMBULATORY_CARE_PROVIDER_SITE_OTHER): Payer: BLUE CROSS/BLUE SHIELD | Admitting: Physician Assistant

## 2016-09-15 VITALS — BP 130/76 | HR 91 | Ht <= 58 in | Wt 127.0 lb

## 2016-09-15 DIAGNOSIS — F3342 Major depressive disorder, recurrent, in full remission: Secondary | ICD-10-CM | POA: Diagnosis not present

## 2016-09-15 DIAGNOSIS — T887XXA Unspecified adverse effect of drug or medicament, initial encounter: Secondary | ICD-10-CM

## 2016-09-15 DIAGNOSIS — T50905A Adverse effect of unspecified drugs, medicaments and biological substances, initial encounter: Secondary | ICD-10-CM

## 2016-09-15 DIAGNOSIS — M47817 Spondylosis without myelopathy or radiculopathy, lumbosacral region: Secondary | ICD-10-CM

## 2016-09-15 DIAGNOSIS — R5383 Other fatigue: Secondary | ICD-10-CM

## 2016-09-15 DIAGNOSIS — F411 Generalized anxiety disorder: Secondary | ICD-10-CM

## 2016-09-15 LAB — CBC
HCT: 43.8 % (ref 35.0–45.0)
Hemoglobin: 14.1 g/dL (ref 11.7–15.5)
MCH: 29.7 pg (ref 27.0–33.0)
MCHC: 32.2 g/dL (ref 32.0–36.0)
MCV: 92.2 fL (ref 80.0–100.0)
MPV: 11.2 fL (ref 7.5–12.5)
PLATELETS: 240 10*3/uL (ref 140–400)
RBC: 4.75 MIL/uL (ref 3.80–5.10)
RDW: 14.5 % (ref 11.0–15.0)
WBC: 5.3 10*3/uL (ref 3.8–10.8)

## 2016-09-15 NOTE — Progress Notes (Signed)
   Subjective:    Patient ID: Leslie Roberson, female    DOB: 01-28-1956, 61 y.o.   MRN: [id number]  HPI  Pt is a 61 yo female with MDD, GAD who presents to the clinic to discuss no energy. She feels like every since she started abilify she has been so tired. She is sleeping well at night. She is on her last week of tapering off lamictal. She feels like her depression is under control. She denies any crying or worry. She is supposed to drive to [place] and concerned about how fatigue she might be. She is very happy and her life is super busy with travel. She started gabapentin in January with a little fatigue but not like this   .Marland Kitchen Active Ambulatory Problems    Diagnosis Date Noted  . GERD (gastroesophageal reflux disease) 11/27/2015  . Osteoporosis 11/27/2015  . GAD (generalized anxiety disorder) 11/27/2015  . MDD (major depressive disorder), recurrent, in full remission ([place]) 11/27/2015  . Chronic dryness of both eyes 11/27/2015  . Hypothyroidism 11/27/2015  . Asthma, chronic 11/27/2015  . Insomnia 11/27/2015  . Seborrheic keratoses 11/27/2015  . Diverticulitis of colon 12/21/2015  . Hip bursitis 12/25/2015  . Biceps tendonitis on right 12/25/2015  . Idiopathic scoliosis 01/22/2016  . Vaginal atrophy 01/31/2016  . Rosacea 02/04/2016  . Right knee pain 02/19/2016  . Paresthesia 04/03/2016  . Facet hypertrophy of lumbosacral region 04/03/2016  . Chronic back pain 04/03/2016  . Acute medial meniscus tear 04/23/2016  . Left knee pain 04/28/2016  . Bad taste in mouth 07/26/2016  . Mild intermittent asthma without complication 66/08/3014  . No energy 09/15/2016   Resolved Ambulatory Problems    Diagnosis Date Noted  . No Resolved Ambulatory Problems   Past Medical History:  Diagnosis Date  . Anxiety   . Asthma, chronic 11/27/2015  . Depression   . Diverticulitis of colon 12/21/2015  . GERD (gastroesophageal reflux disease) 11/27/2015  . Idiopathic scoliosis 01/22/2016   . Osteoporosis 11/27/2015  . Thyroid disease         Review of Systems  All other systems reviewed and are negative.      Objective:   Physical Exam  Constitutional: She is oriented to person, place, and time. She appears well-developed and well-nourished.  HENT:  Head: Normocephalic and atraumatic.  Cardiovascular: Normal rate, regular rhythm and normal heart sounds.   Pulmonary/Chest: Effort normal and breath sounds normal.  Neurological: She is alert and oriented to person, place, and time.  Psychiatric: She has a normal mood and affect. Her behavior is normal.          Assessment & Plan:  Marland KitchenMarland KitchenAddisyn was seen today for medication management.  Diagnoses and all orders for this visit:  No energy -     CBC -     Comprehensive metabolic panel -     Ferritin -     Vitamin B12 -     Vit D  25 hydroxy (rtn osteoporosis monitoring)  MDD (major depressive disorder), recurrent, in full remission ([place])  GAD (generalized anxiety disorder)  Adverse effect of drug, initial encounter   Stop Abilify to see if would help with fatigue. Stop lamictal due to being done with taper.  Will hold on any additional depression medications until see how she feels off supplemental medication.  Will get labs.  Call me if feeling like mood is changing or worsening to add something to pristiq.

## 2016-09-16 LAB — COMPREHENSIVE METABOLIC PANEL
ALT: 32 U/L — ABNORMAL HIGH (ref 6–29)
AST: 24 U/L (ref 10–35)
Albumin: 4.3 g/dL (ref 3.6–5.1)
Alkaline Phosphatase: 64 U/L (ref 33–130)
BUN: 11 mg/dL (ref 7–25)
CHLORIDE: 104 mmol/L (ref 98–110)
CO2: 25 mmol/L (ref 20–31)
CREATININE: 0.78 mg/dL (ref 0.50–0.99)
Calcium: 9.5 mg/dL (ref 8.6–10.4)
GLUCOSE: 86 mg/dL (ref 65–99)
Potassium: 4.4 mmol/L (ref 3.5–5.3)
SODIUM: 142 mmol/L (ref 135–146)
Total Bilirubin: 0.3 mg/dL (ref 0.2–1.2)
Total Protein: 6.5 g/dL (ref 6.1–8.1)

## 2016-09-16 LAB — VITAMIN D 25 HYDROXY (VIT D DEFICIENCY, FRACTURES): Vit D, 25-Hydroxy: 39 ng/mL (ref 30–100)

## 2016-09-16 LAB — FERRITIN: Ferritin: 27 ng/mL (ref 20–288)

## 2016-09-16 LAB — VITAMIN B12: VITAMIN B 12: 446 pg/mL (ref 200–1100)

## 2016-09-24 ENCOUNTER — Telehealth: Payer: Self-pay | Admitting: *Deleted

## 2016-09-24 MED ORDER — FENTANYL CITRATE (PF) 100 MCG/2ML IJ SOLN: 25.0000 ug | INTRAMUSCULAR | Status: DC | PRN

## 2016-09-24 MED ORDER — SODIUM CHLORIDE 0.9 % IV SOLN
Freq: Once | INTRAVENOUS | Status: AC
  Administered 2016-09-26: 09:00:00 via INTRAVENOUS

## 2016-09-24 MED ORDER — KETOROLAC TROMETHAMINE 30 MG/ML IJ SOLN
30.0000 mg | Freq: Once | INTRAMUSCULAR | Status: AC
  Administered 2016-09-26: 30 mg via INTRAVENOUS

## 2016-09-24 MED ORDER — MIDAZOLAM HCL 2 MG/2ML IJ SOLN
1.0000 mg | INTRAMUSCULAR | Status: DC | PRN
  Administered 2016-09-26 (×2): 0.5 mg via INTRAVENOUS

## 2016-09-24 NOTE — Telephone Encounter (Signed)
Pt called and stated that the medications that she was taking for depression are gettting out of her system and was told to leave a message with Luvenia Starch regarding this. She stated that Luvenia Starch told her that she would take care of this personally for her while she was out on leave since she knows what was going on. I told her that I would fwd this to her .Audelia Hives [place]

## 2016-09-25 ENCOUNTER — Encounter: Payer: Self-pay | Admitting: Family Medicine

## 2016-09-25 ENCOUNTER — Ambulatory Visit (INDEPENDENT_AMBULATORY_CARE_PROVIDER_SITE_OTHER): Payer: BLUE CROSS/BLUE SHIELD | Admitting: Family Medicine

## 2016-09-25 VITALS — BP 110/66 | HR 98 | Ht <= 58 in | Wt 121.0 lb

## 2016-09-25 DIAGNOSIS — M25561 Pain in right knee: Secondary | ICD-10-CM | POA: Diagnosis not present

## 2016-09-25 DIAGNOSIS — F3342 Major depressive disorder, recurrent, in full remission: Secondary | ICD-10-CM | POA: Diagnosis not present

## 2016-09-25 MED ORDER — BUPROPION HCL ER (XL) 150 MG PO TB24: 150.0000 mg | ORAL_TABLET | ORAL | 1 refills | Status: DC

## 2016-09-25 NOTE — Progress Notes (Signed)
Leslie Roberson is a 61 y.o. female who presents to [facility] today for follow-up right knee pain. Patient continues to experience right knee pain. She had arthroscopic surgery to debride torn meniscus several months ago. Her knee pain presented when she knelt on a bed and felt a pop. She notes pain in the anterior lateral knee. She has had a trial of conservative management with diclofenac gel and relative rest which helps a little. The pain tends to come and go.  Additionally patient would like to follow-up her depression symptoms. She takes Pristiq and has been tapering off of Lamictal and Abilify. She notes the anhedonia seems to begin a little bit worse.   Past Medical History:  Diagnosis Date  . Anxiety   . Asthma, chronic 11/27/2015  . Depression   . Diverticulitis of colon 12/21/2015   Colonoscopy every 5 years/digestive health.   . GERD (gastroesophageal reflux disease) 11/27/2015  . Idiopathic scoliosis 01/22/2016  . Osteoporosis 11/27/2015   On prolia.   . Thyroid disease    Past Surgical History:  Procedure Laterality Date  . ABDOMINAL HYSTERECTOMY    . bladder mesh    . ELBOW SURGERY Right   . SPINAL FUSION    . TONSILLECTOMY    . TOTAL HIP ARTHROPLASTY     Social History  Substance Use Topics  . Smoking status: Never Smoker  . Smokeless tobacco: Never Used  . Alcohol use Yes     ROS:  As above   Medications: Current Outpatient Prescriptions  Medication Sig Dispense Refill  . albuterol (PROVENTIL HFA;VENTOLIN HFA) 108 (90 Base) MCG/ACT inhaler Inhale 1-2 puffs into the lungs every 4 (four) hours as needed for wheezing or shortness of breath. 1 Inhaler 3  . busPIRone (BUSPAR) 30 MG tablet Take 1 tablet (30 mg total) by mouth 2 (two) times daily. 60 tablet 5  . clonazePAM (KLONOPIN) 2 MG tablet Take 1.5 tablets (3 mg total) by mouth daily. 45 tablet 5  . denosumab (PROLIA) 60 MG/ML SOLN injection Inject 60 mg  into the skin every 6 (six) months. Administer in upper arm, thigh, or abdomen    . desvenlafaxine (PRISTIQ) 100 MG 24 hr tablet TAKE 1 TABLET BY MOUTH DAILY 30 tablet 4  . desvenlafaxine (PRISTIQ) 50 MG 24 hr tablet TAKE 1 TABLET BY MOUTH DAILY 30 tablet 4  . DEXILANT 60 MG capsule TAKE 1 CAPSULE (60 MG) BY MOUTH DAILY 30 capsule 3  . diclofenac sodium (VOLTAREN) 1 % GEL APPLY 4G TOPICALLY 4 TIMES DAILY 200 g 2  . flunisolide (NASAREL) 29 MCG/ACT (0.025%) nasal spray Place 2 sprays into the nose daily as needed for rhinitis. Dose is for each nostril.    Marland Kitchen gabapentin (NEURONTIN) 300 MG capsule One tab PO qHS for a week, then BID for a week, then TID. May double weekly to a max of 3,600mg /day 180 capsule 3  . levothyroxine (SYNTHROID, LEVOTHROID) 125 MCG tablet TAKE ONE-HALF TABLET BY MOUTH ONCE DAILY 30 tablet 2  . naproxen sodium (ANAPROX) 220 MG tablet Take 220 mg by mouth as needed.    . Omega-3 Fatty Acids (FISH OIL PO) Take 1 tablet by mouth daily.    . traZODone (DESYREL) 50 MG tablet TAKE 1 TABLET (50 MG TOTAL) BY MOUTH AT BEDTIME. 90 tablet 1  . triamcinolone ointment (KENALOG) 0.1 % Apply 1 application topically 2 (two) times daily. To affected area(s) as needed. Use no longer than 1-2 weeks. [name]  g 0  . buPROPion (WELLBUTRIN XL) 150 MG 24 hr tablet Take 1 tablet (150 mg total) by mouth every morning. 30 tablet 1   No current facility-administered medications for this visit.    Facility-Administered Medications Ordered in Other Visits  Medication Dose Route Frequency Provider Last Rate Last Dose  . [START ON 09/26/2016] 0.9 %  sodium chloride infusion   Intravenous Once Rolla Flatten, MD      . Derrill Memo ON 09/26/2016] fentaNYL (SUBLIMAZE) injection 25-50 mcg  25-50 mcg Intravenous Q5 min PRN Rolla Flatten, MD      . Derrill Memo ON 09/26/2016] ketorolac (TORADOL) 30 MG/ML injection 30 mg  30 mg Intravenous Once Rolla Flatten, MD      . Derrill Memo ON 09/26/2016] midazolam (VERSED) injection 1-2 mg  1-2 mg  Intravenous Q5 min PRN Rolla Flatten, MD       Allergies  Allergen Reactions  . Adhesive [Tape]   . Demerol [Meperidine]   . Dilaudid [Hydromorphone]   . Levofloxacin      Exam:  BP 110/66   Pulse 98   Ht 4\' 10"  (1.473 m)   Wt 121 lb (54.9 kg)   BMI 25.29 kg/m  General: Well Developed, well nourished, and in no acute distress.  Neuro/Psych: Alert and oriented x3, extra-ocular muscles intact, able to move all 4 extremities, sensation grossly intact. Skin: Warm and dry, no rashes noted.  Respiratory: Not using accessory muscles, speaking in full sentences, trachea midline.  Cardiovascular: Pulses palpable, no extremity edema. Abdomen: Does not appear distended. MSK:  Right knee: Well-appearing with no effusion or erythema. Portal scars are normal appearing and nontender Normal motion Knee is nontender Stable ligamentous exam  Psych: Alert and oriented normal speech thought process and affect.  Limited musculoskeletal ultrasound of the right knee: Focus on the anterior lateral meniscus shows a hypoechoic change within the superficial portion of the meniscus indicating possible tear.  No results found for this or any previous visit (from the past 48 hour(s)). No results found.    Assessment and Plan: 61 y.o. female with  Right knee pain: Patient experienced a repeat injury following her arthroscopic surgery. I'm concerned that she may have torn her lateral meniscus. When I see ultrasound may be postsurgical changes however. I recommended she is failing conservative management she discussed the case with her orthopedic surgeon prior to obtaining a repeat expensive MRI.  Psych: Slightly worsening anhedonia symptoms. Continue Pristiq and Wellbutrin and recheck in a month or so.    No orders of the defined types were placed in this encounter.  Meds ordered this encounter  Medications  . buPROPion (WELLBUTRIN XL) 150 MG 24 hr tablet    Sig: Take 1 tablet (150 mg total) by  mouth every morning.    Dispense:  30 tablet    Refill:  1    Discussed warning signs or symptoms. Please see discharge instructions. Patient expresses understanding.

## 2016-09-25 NOTE — Patient Instructions (Signed)
Thank you for coming in today. For the knee I recommend that you follow up with Dr Noemi Chapel before you do a MRI.    Try adding wellbutrin to Pristiq.   Recheck in 4-8 weeks about mood.    [place].

## 2016-09-26 ENCOUNTER — Other Ambulatory Visit: Payer: Self-pay | Admitting: Family Medicine

## 2016-09-26 ENCOUNTER — Other Ambulatory Visit: Payer: BLUE CROSS/BLUE SHIELD

## 2016-09-26 ENCOUNTER — Ambulatory Visit
Admission: RE | Admit: 2016-09-26 | Discharge: 2016-09-26 | Disposition: A | Discharge status: Home / Self Care | Payer: BLUE CROSS/BLUE SHIELD | Source: Ambulatory Visit | Attending: Family Medicine | Admitting: Family Medicine

## 2016-09-26 VITALS — BP 138/80 | HR 76 | Temp 98.3°F | Resp 18 | Ht <= 58 in | Wt 125.0 lb

## 2016-09-26 DIAGNOSIS — M47817 Spondylosis without myelopathy or radiculopathy, lumbosacral region: Secondary | ICD-10-CM

## 2016-09-26 DIAGNOSIS — M545 Low back pain: Secondary | ICD-10-CM | POA: Diagnosis not present

## 2016-09-26 IMAGING — XA DG FACET JT NEURO DESTRUCT SING L/S W/ IMAG GUIDE
5 series · 5 of 5 positions shown · non-contrast
Comparison: Medial branch blocks performed 09/12/2016.

CLINICAL DATA: Facet mediated lumbago.

EXAM:
RADIOFREQUENCY FACET DENERVATION THE BILATERAL L4-5 AND L5-S1 FACET
JOINTS

[Series 1: ortho standard · 1 of 1 slices shown (1 of 5)]
[im 1/1]
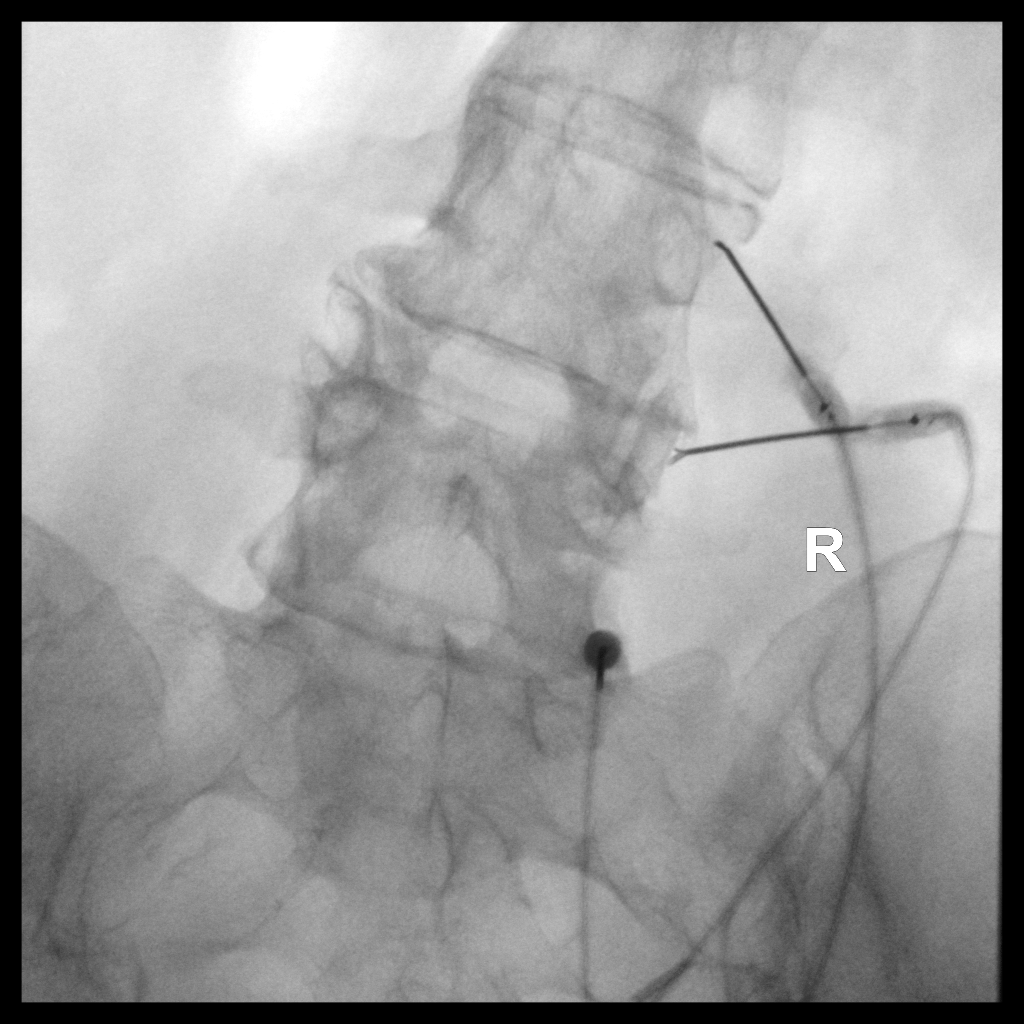

[Series 2: ortho standard · 1 of 1 slices shown (2 of 5)]
[im 1/1]
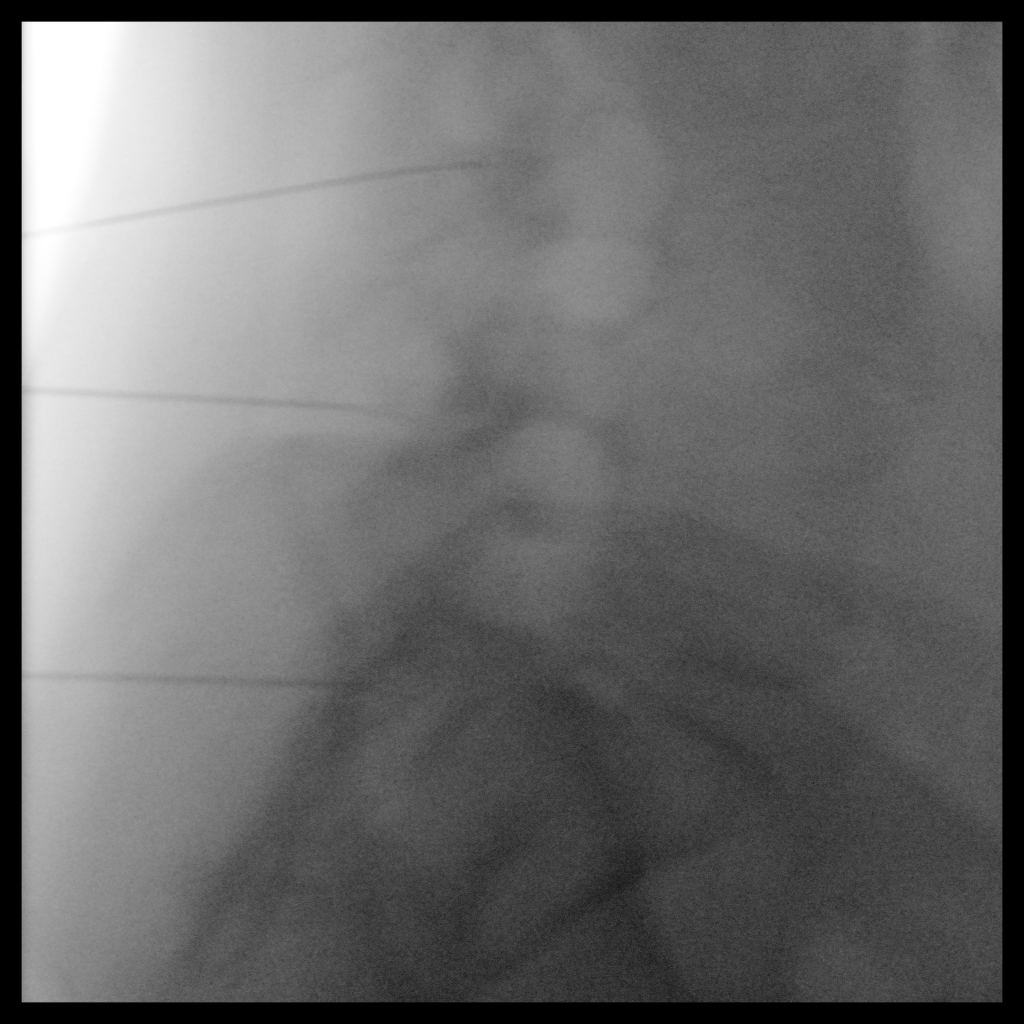

[Series 3: ortho standard · 1 of 1 slices shown (3 of 5)]
[im 1/1]
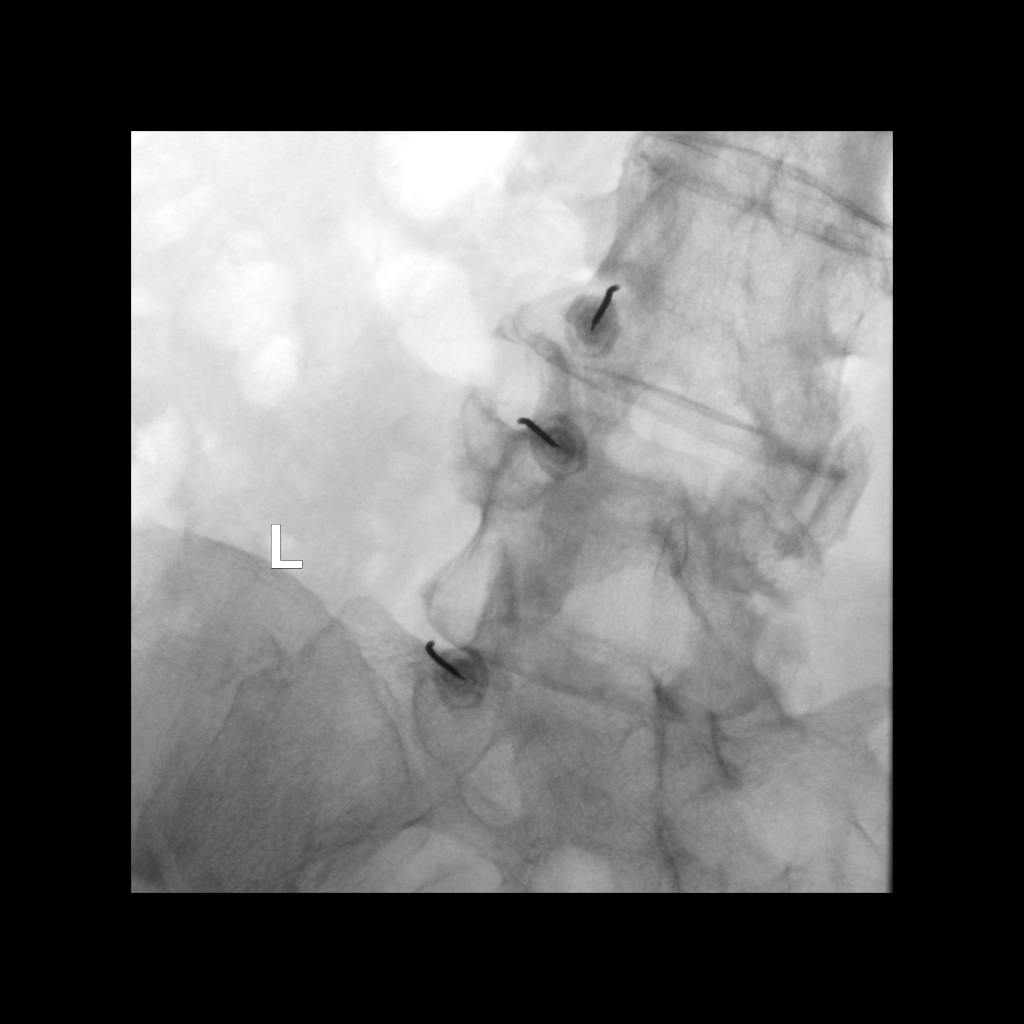

[Series 4: ortho standard · 1 of 1 slices shown (4 of 5)]
[im 1/1]
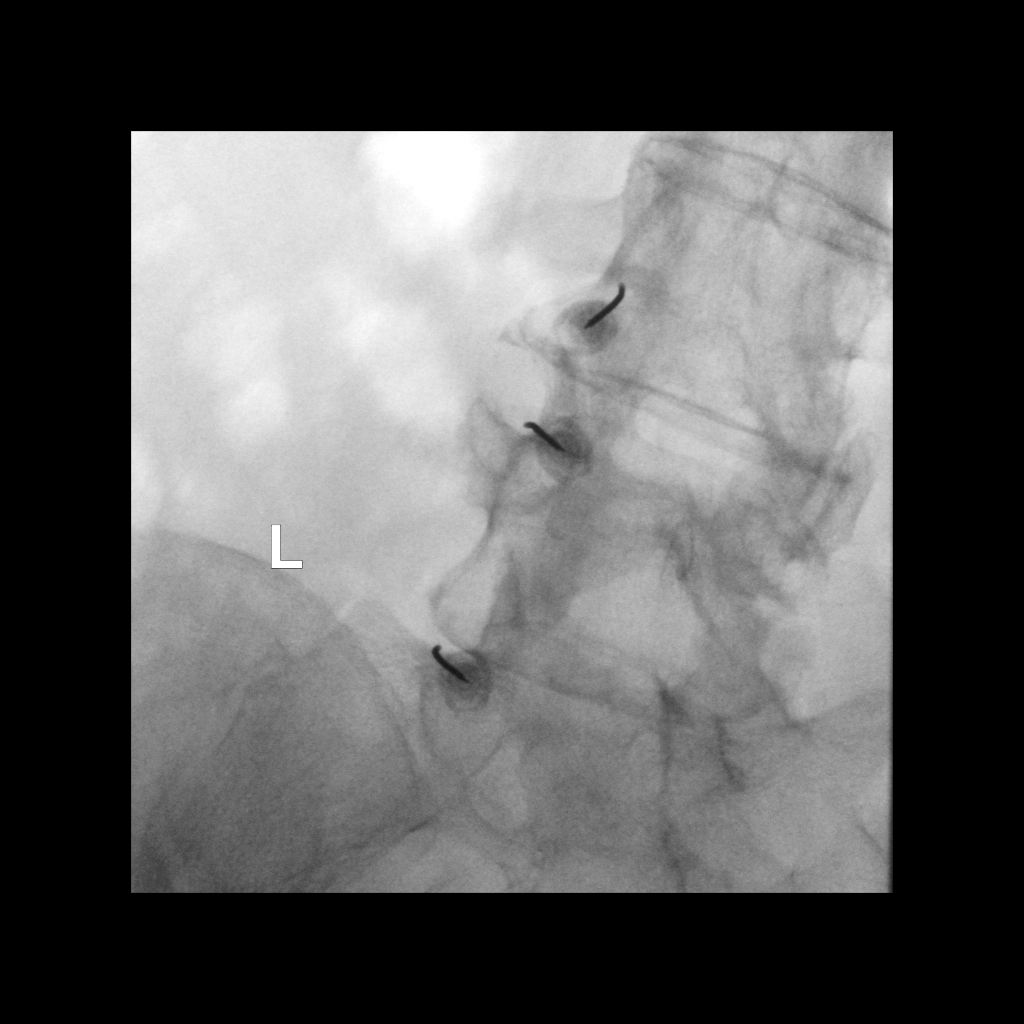

[Series 5: ortho standard · 1 of 1 slices shown (5 of 5)]
[im 1/1]
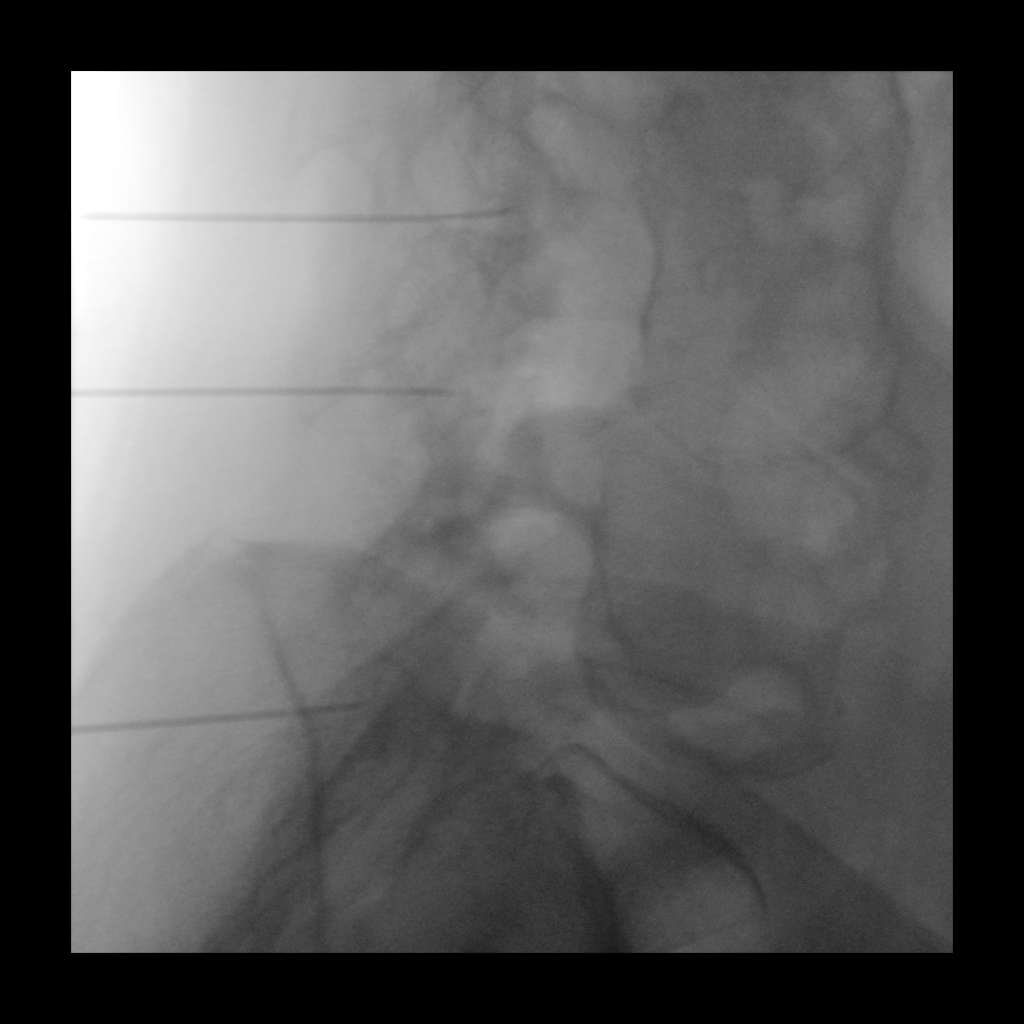

[5 of 5 positions shown; findings below may reference images not displayed]

FINDINGS: The procedure and potential complications were explained to the
patient, followed by voluntary informed written consent. Time-out
was performed.

Patient was placed prone on the table, grabbing pad was placed. Back
was prepped and draped in the usual sterile fashion.

Subcutaneous 1% lidocaine was instilled into the superficial soft
tissues.

Under fluoroscopic guidance a 20 gauge 100 mm [REDACTED] needle was
advanced to the superior aspect of the RIGHT L4 transverse process
and lateral aspect of the RIGHT L4 superior articulating facet. The
needle was then slightly advanced 2 mm to lie along the path of the
RIGHT L3 medial branch nerve. Needle tip was confirmed
fluoroscopically. Sensory and motor stimulation were performed which
elicited deep local back discomfort, but no evidence of motor
stimulation in the gluteal muscles or extremity. Subsequently,
medial branch nerve denervation was performed for 90 seconds at 80
degrees centigrade without complication.

An identical procedure was performed along the course of the LEFT L3
medial branch nerve, LEFT L4 medial branch nerve, RIGHT L4 medial
branch nerve, and BILATERAL L5 dorsal primary rami.

Postprocedure, the patient was neurovascular intact. She was able
walk without difficulty. There was mild soreness. Detailed
postprocedure instructions were given.
IMPRESSION: Technically successful radiofrequency denervation of the BILATERAL
L4-5 and L5-S1 facets.

## 2016-09-26 NOTE — Discharge Instructions (Signed)
Radio Frequency Ablation Post Procedure Discharge Instructions ° °1. May resume a regular diet and any medications that you routinely take (including pain medications). °2. No driving day of procedure. °3. Upon discharge go home and rest for at least 4 hours.  May use an ice pack as needed to injection sites on back. °4. Remove bandades later, today. ° ° ° °Please contact our office at [phone] for the following symptoms: ° °· Fever greater than 100 degrees °· Increased swelling, pain, or redness at injection site. ° ° °Thank you for visiting  Imaging. °

## 2016-10-03 NOTE — Telephone Encounter (Signed)
Call pt: find out how she is doing on wellbutrin. I see dr. Georgina Snell started her on this.

## 2016-10-03 NOTE — Telephone Encounter (Signed)
Pt stated that she is no longer taking Wellbutrin due to constipation and anxiety attacks. Pt states she started taking Abilify again and is breaking it in half. Pt states she is tired but doesn't know if its from Abilify or her adventurous vacation. Pt states that she will need a refill on Abilify. Pt states she is on vacation for a while in [place]. Pt stated she will contact office with a pharmacy. Please Advise?

## 2016-10-04 ENCOUNTER — Other Ambulatory Visit: Payer: Self-pay | Admitting: Family Medicine

## 2016-10-04 ENCOUNTER — Other Ambulatory Visit: Payer: Self-pay | Admitting: Physician Assistant

## 2016-10-04 DIAGNOSIS — R202 Paresthesia of skin: Secondary | ICD-10-CM

## 2016-10-04 NOTE — Telephone Encounter (Signed)
That's fine, feel free to send in the appropriate dosage when she calls back with a pharmacy.  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

## 2016-10-21 NOTE — Telephone Encounter (Signed)
LVM requesting pt to call the office.  

## 2016-10-21 NOTE — Telephone Encounter (Signed)
Pt called and stated that she has enough Abilify till she gets back from [place]. Pt states that she is having a hard time sleeping while taking it. Pt scheduled an appt with Dr. Georgina Snell.

## 2016-10-23 ENCOUNTER — Encounter: Payer: Self-pay | Admitting: Family Medicine

## 2016-10-23 ENCOUNTER — Ambulatory Visit (INDEPENDENT_AMBULATORY_CARE_PROVIDER_SITE_OTHER): Payer: BLUE CROSS/BLUE SHIELD | Admitting: Family Medicine

## 2016-10-23 VITALS — BP 126/65 | HR 103 | Wt 130.0 lb

## 2016-10-23 DIAGNOSIS — F3342 Major depressive disorder, recurrent, in full remission: Secondary | ICD-10-CM

## 2016-10-23 MED ORDER — ARIPIPRAZOLE 2 MG PO TABS: 1.0000 mg | ORAL_TABLET | Freq: Every day | ORAL | 0 refills | Status: DC

## 2016-10-23 NOTE — Patient Instructions (Signed)
Thank you for coming in today. Reduce Albilify dose to 1mg  If not better let me know and we will start  Recheck with me or Jade in 4 weeks or sooner if needed.   You should hear from Psychiatry about referral in a few days.  Let me know if you do not hear anything.     Rexulti  Brexpiprazole oral tablets What is this medicine? BREXPIPRAZOLE (brex PIP ray zole) is an antipsychotic. It is used to treat schizophrenia. This medicine is also used in combination with antidepressants to treat major depressive disorder. This medicine may be used for other purposes; ask your health care provider or pharmacist if you have questions. COMMON BRAND NAME(S): REXULTI What should I tell my health care provider before I take this medicine? They need to know if you have any of these conditions: -dementia -diabetes -difficulty swallowing -heart disease -high cholesterol or high levels of triglycerides in the blood -history of stroke -kidney disease -liver disease -low blood counts, like low white cell, platelet, or red cell counts -Parkinson's disease -seizures -suicidal thoughts, plans, or attempt; a previous suicide attempt by you or a family member -an unusual or allergic reaction to bexpiprazole, other medicines, foods, dyes, or preservatives -pregnant or trying to get pregnant -breast-feeding How should I use this medicine? Take this medicine by mouth with a glass of water. Follow the directions on the prescription label. You can take this medicine with or without food. Take your doses at regular intervals. Do not take your medicine more often than directed. Do not stop taking except on the advice of your doctor or health care professional. A special MedGuide will be given to you by the pharmacist with each prescription and refill. Be sure to read this information carefully each time. Talk to your pediatrician regarding the use of this medicine in children. Special care may be  needed. Overdosage: If you think you have taken too much of this medicine contact a poison control center or emergency room at once. NOTE: This medicine is only for you. Do not share this medicine with others. What if I miss a dose? If you miss a dose, take it as soon as you can. If it is almost time for your next dose, take only that dose. Do not take double or extra doses. What may interact with this medicine? Do not take this medicine with any of the following medications: -aripiprazole -metoclopramide This medicine may also interact with the following medications: -clarithromycin -certain medicines for blood pressure -certain medicines for fungal infections like fluconazole, itraconazole, ketoconazole -duloxetine -fluoxetine -paroxetine -quinidine -rifampin -St. John's Wort This list may not describe all possible interactions. Give your health care provider a list of all the medicines, herbs, non-prescription drugs, or dietary supplements you use. Also tell them if you smoke, drink alcohol, or use illegal drugs. Some items may interact with your medicine. What should I watch for while using this medicine? Visit your doctor or health care professional for regular checks on your progress. It may be several weeks before you see the full effects of this medicine. Notify your doctor or health care professional if your symptoms get worse, if you have new symptoms, if you are having an unusual effect from this medicine, or if you feel out of control, very discouraged or think you might harm yourself or others. Do not suddenly stop taking this medicine. You may need to gradually reduce the dose. Ask your doctor or health care professional for advice. You may  get dizzy or drowsy. Do not drive, use machinery, or do anything that needs mental alertness until you know how this medicine affects you. Do not stand or sit up quickly, especially if you are an older patient. This reduces the risk of dizzy  or fainting spells. Alcohol can increase dizziness and drowsiness. Avoid alcoholic drinks. This medicine may cause dry eyes and blurred vision. If you wear contact lenses you may feel some discomfort. Lubricating drops may help. See your eye doctor if the problem does not go away or is severe. This medicine can reduce the response of your body to heat or cold. Dress warm in cold weather and stay hydrated in hot weather. If possible, avoid extreme temperatures like saunas, hot tubs, very hot or cold showers, or activities that can cause dehydration such as vigorous exercise. What side effects may I notice from receiving this medicine? Side effects that you should report to your doctor or health care professional as soon as possible: -allergic reactions like skin rash, itching or hives, swelling of the face, lips, or tongue -breathing problems -confusion -feeling faint or lightheaded, falls -fever or chills, sore throat -increased hunger or thirst -increased urination -problems with balance, talking, walking -restlessness or need to keep moving -seizures -suicidal thoughts or other mood changes -trouble swallowing -uncontrollable head, mouth, neck, arm, or leg movements -unusually weak or tired Side effects that usually do not require medical attention (report to your doctor or health care professional if they continue or are bothersome): -constipation -drowsiness -headache -stomach upset -weight gain This list may not describe all possible side effects. Call your doctor for medical advice about side effects. You may report side effects to FDA at 1-800-FDA-1088. Where should I keep my medicine? Keep out of the reach of children. Store at room temperature between 15 and 30 degrees C (59 and 86 degrees F). Throw away any unused medicine after the expiration date. NOTE: This sheet is a summary. It may not cover all possible information. If you have questions about this medicine, talk to your  doctor, pharmacist, or health care provider.  2018 Elsevier/Gold Standard (2015-04-19 09:17:04)

## 2016-10-24 NOTE — Progress Notes (Signed)
Leslie Roberson is a 61 y.o. female who presents to [facility]: [facility] today for follow-up mood. Patient is been seen recently for depressive disorder. She was unable to tolerate Lamictal and has stopped and on her own. Additionally she has restarted her on Abilify prescription. This has helped her depressive symptoms however she notes that it is causing insomnia and weight gain. She has trouble staying asleep. She is reluctant to stop the medicine however as it seems to be helping her symptoms otherwise. She denies any SI or HI. She feels well otherwise.   Past Medical History:  Diagnosis Date  . Anxiety   . Asthma, chronic 11/27/2015  . Depression   . Diverticulitis of colon 12/21/2015   Colonoscopy every 5 years/digestive health.   . GERD (gastroesophageal reflux disease) 11/27/2015  . Idiopathic scoliosis 01/22/2016  . Osteoporosis 11/27/2015   On prolia.   . Thyroid disease    Past Surgical History:  Procedure Laterality Date  . ABDOMINAL HYSTERECTOMY    . bladder mesh    . ELBOW SURGERY Right   . SPINAL FUSION    . TONSILLECTOMY    . TOTAL HIP ARTHROPLASTY     Social History  Substance Use Topics  . Smoking status: Never Smoker  . Smokeless tobacco: Never Used  . Alcohol use Yes   family history includes COPD in her paternal aunt; Cancer in her mother; Depression in her mother; Diabetes in her maternal aunt; Heart attack in her father; Hyperlipidemia in her father and paternal aunt; Hypertension in her father.  ROS as above:  Medications: Current Outpatient Prescriptions  Medication Sig Dispense Refill  . albuterol (PROVENTIL HFA;VENTOLIN HFA) 108 (90 Base) MCG/ACT inhaler Inhale 1-2 puffs into the lungs every 4 (four) hours as needed for wheezing or shortness of breath. 1 Inhaler 3  . ARIPiprazole (ABILIFY) 2 MG tablet Take 0.5 tablets (1 mg total) by  mouth daily. 45 tablet 0  . busPIRone (BUSPAR) 30 MG tablet Take 1 tablet (30 mg total) by mouth 2 (two) times daily. 60 tablet 5  . clonazePAM (KLONOPIN) 2 MG tablet Take 1.5 tablets (3 mg total) by mouth daily. 45 tablet 5  . denosumab (PROLIA) 60 MG/ML SOLN injection Inject 60 mg into the skin every 6 (six) months. Administer in upper arm, thigh, or abdomen    . desvenlafaxine (PRISTIQ) 100 MG 24 hr tablet TAKE 1 TABLET BY MOUTH DAILY 30 tablet 4  . desvenlafaxine (PRISTIQ) 50 MG 24 hr tablet TAKE 1 TABLET BY MOUTH DAILY 30 tablet 4  . DEXILANT 60 MG capsule TAKE 1 CAPSULE (60 MG) BY MOUTH DAILY 30 capsule 3  . diclofenac sodium (VOLTAREN) 1 % GEL APPLY 4G TOPICALLY 4 TIMES DAILY 200 g 2  . flunisolide (NASAREL) 29 MCG/ACT (0.025%) nasal spray Place 2 sprays into the nose daily as needed for rhinitis. Dose is for each nostril.    Marland Kitchen gabapentin (NEURONTIN) 300 MG capsule TAKE 1 CAP AT BEDTIME X1 WEEK,1 CAP TWICE DAILY X1 WEEK THEN 1 CAP 3X DAILY MAY DOUBLE WEEKLY=3600MG  180 capsule 3  . levothyroxine (SYNTHROID, LEVOTHROID) 125 MCG tablet TAKE ONE-HALF TABLET BY MOUTH ONCE DAILY 30 tablet 2  . naproxen sodium (ANAPROX) 220 MG tablet Take 220 mg by mouth as needed.    . Omega-3 Fatty Acids (FISH OIL PO) Take 1 tablet by mouth daily.    . traZODone (DESYREL) 50 MG tablet TAKE 1 TABLET (50 MG  TOTAL) BY MOUTH AT BEDTIME. 90 tablet 1  . triamcinolone ointment (KENALOG) 0.1 % Apply 1 application topically 2 (two) times daily. To affected area(s) as needed. Use no longer than 1-2 weeks. 30 g 0   No current facility-administered medications for this visit.    Allergies  Allergen Reactions  . Levofloxacin Other (See Comments)    hallucinations  . Adhesive [Tape]   . Demerol [Meperidine] Nausea And Vomiting and Other (See Comments)    Doesn't work well for my pain.  . Dilaudid [Hydromorphone] Rash    Health Maintenance Health Maintenance  Topic Date Due  . HIV Screening  12/24/2016 (Originally  09/18/1970)  . INFLUENZA VACCINE  10/29/2016  . MAMMOGRAM  12/25/2017  . DEXA SCAN  12/25/2017  . PAP SMEAR  01/30/2019  . COLONOSCOPY  12/19/2020  . TETANUS/TDAP  01/29/2026  . Hepatitis C Screening  Completed     Exam:  BP 126/65   Pulse (!) 103   Wt 130 lb (59 kg)   SpO2 95%   BMI 27.17 kg/m  Gen: Well NAD HEENT: EOMI,  MMM Lungs: Normal work of breathing. CTABL Heart: RRR no MRG Abd: NABS, Soft. Nondistended, Nontender Exts: Brisk capillary refill, warm and well perfused.  Psych: Alert and oriented processandaffect.NoSIorHIexpressed.  Depression screen PHQ 2/9 10/23/2016  Decreased Interest 1  Down, Depressed, Hopeless 0  PHQ - 2 Score 1  Altered sleeping 3  Tired, decreased energy 2  Change in appetite 3  Feeling bad or failure about yourself  0  Trouble concentrating 0  Moving slowly or fidgety/restless 0  Suicidal thoughts 0  PHQ-9 Score 9      No results found for this or any previous visit (from the past 72 hour(s)). No results found.    Assessment and Plan: 61 y.o. female with depressive disorder. Doing well. Abilify seems to be complicating the picture somewhat. I think it is probably contributing to insomnia. We discussed options. Plan to reduce the dose to 1 mg. If not better will likely try switching to [place].  However think it's a good idea to consider also referral to psychiatry as we are running out of ideas.   Orders Placed This Encounter  Procedures  . Ambulatory referral to Behavioral Health    Referral Priority:   Routine    Referral Type:   Psychiatric    Referral Reason:   Specialty Services Required    Requested Specialty:   Behavioral Health    Number of Visits Requested:   1   Meds ordered this encounter  Medications  . DISCONTD: ARIPiprazole (ABILIFY) 2 MG tablet    Sig: Take 2 mg by mouth daily.    Refill:  5  . ARIPiprazole (ABILIFY) 2 MG tablet    Sig: Take 0.5 tablets (1 mg total) by mouth daily.    Dispense:  45 tablet     Refill:  0     Discussed warning signs or symptoms. Please see discharge instructions. Patient expresses understanding.

## 2016-10-27 ENCOUNTER — Ambulatory Visit (INDEPENDENT_AMBULATORY_CARE_PROVIDER_SITE_OTHER): Payer: BLUE CROSS/BLUE SHIELD | Admitting: Osteopathic Medicine

## 2016-10-27 VITALS — BP 107/67 | HR 90 | Temp 97.8°F | Ht <= 58 in | Wt 130.0 lb

## 2016-10-27 DIAGNOSIS — B9689 Other specified bacterial agents as the cause of diseases classified elsewhere: Secondary | ICD-10-CM

## 2016-10-27 DIAGNOSIS — J019 Acute sinusitis, unspecified: Secondary | ICD-10-CM | POA: Diagnosis not present

## 2016-10-27 MED ORDER — AMOXICILLIN-POT CLAVULANATE 875-125 MG PO TABS: 1.0000 | ORAL_TABLET | Freq: Two times a day (BID) | ORAL | 0 refills | Status: DC

## 2016-10-27 MED ORDER — IPRATROPIUM BROMIDE 0.06 % NA SOLN: 2.0000 | Freq: Four times a day (QID) | NASAL | 1 refills | Status: DC

## 2016-10-27 NOTE — Progress Notes (Signed)
HPI: Leslie Roberson is a 61 y.o. female who presents to [facility] 10/27/16 for chief complaint of: No chief complaint on file.   Acute Illness: . Context: recent airplane travel and feeling ill since few days after getting back  . Location & Quality: chills in the beginning but no fever, + postnasal drip, stuffy nose . Assoc signs/symptoms: see ROS . Duration: 7+ days . Modifying factors: has tried the following OTC/Rx medications: Aleve, nasal spray    Past medical, social and family history reviewed.   Immune compromising conditions or other risk factors: Hx asthma  Current medications and allergies reviewed.  Outpatient Encounter Prescriptions as of 10/27/2016  Medication Sig  . albuterol (PROVENTIL HFA;VENTOLIN HFA) 108 (90 Base) MCG/ACT inhaler Inhale 1-2 puffs into the lungs every 4 (four) hours as needed for wheezing or shortness of breath.  . ARIPiprazole (ABILIFY) 2 MG tablet Take 0.5 tablets (1 mg total) by mouth daily.  . busPIRone (BUSPAR) 30 MG tablet Take 1 tablet (30 mg total) by mouth 2 (two) times daily.  . clonazePAM (KLONOPIN) 2 MG tablet Take 1.5 tablets (3 mg total) by mouth daily.  Marland Kitchen denosumab (PROLIA) 60 MG/ML SOLN injection Inject 60 mg into the skin every 6 (six) months. Administer in upper arm, thigh, or abdomen  . desvenlafaxine (PRISTIQ) 100 MG 24 hr tablet TAKE 1 TABLET BY MOUTH DAILY  . desvenlafaxine (PRISTIQ) 50 MG 24 hr tablet TAKE 1 TABLET BY MOUTH DAILY  . DEXILANT 60 MG capsule TAKE 1 CAPSULE (60 MG) BY MOUTH DAILY  . diclofenac sodium (VOLTAREN) 1 % GEL APPLY 4G TOPICALLY 4 TIMES DAILY  . flunisolide (NASAREL) 29 MCG/ACT (0.025%) nasal spray Place 2 sprays into the nose daily as needed for rhinitis. Dose is for each nostril.  Marland Kitchen gabapentin (NEURONTIN) 300 MG capsule TAKE 1 CAP AT BEDTIME X1 WEEK,1 CAP TWICE DAILY X1 WEEK THEN 1 CAP 3X DAILY MAY DOUBLE WEEKLY=3600MG   . levothyroxine (SYNTHROID, LEVOTHROID) 125  MCG tablet TAKE ONE-HALF TABLET BY MOUTH ONCE DAILY  . naproxen sodium (ANAPROX) 220 MG tablet Take 220 mg by mouth as needed.  . Omega-3 Fatty Acids (FISH OIL PO) Take 1 tablet by mouth daily.  . traZODone (DESYREL) 50 MG tablet TAKE 1 TABLET (50 MG TOTAL) BY MOUTH AT BEDTIME.  Marland Kitchen triamcinolone ointment (KENALOG) 0.1 % Apply 1 application topically 2 (two) times daily. To affected area(s) as needed. Use no longer than 1-2 weeks.   No facility-administered encounter medications on file as of 10/27/2016.        Review of Systems:  Constitutional: one day of fever/chills  HEENT: sinus bilateral headache, Yes  sore throat, No  swollen glands  Cardiovascular: No chest pain  Respiratory:No  cough, No  shortness of breath  Gastrointestinal: No  nausea, No  vomiting,  No  diarrhea  Musculoskeletal:   No  myalgia/arthralgia  Skin/Integument:  No  rash   Detailed Exam:  BP 107/67   Pulse 90   Temp 97.8 F (36.6 C) (Oral)   Ht 4\' 10"  (1.473 m)   Wt 130 lb (59 kg)   BMI 27.17 kg/m   Constitutional:   VSS, see above.   General Appearance: alert, well-developed, well-nourished, NAD  Eyes:   Normal lids and conjunctive, non-icteric sclera  Ears, Nose, Mouth, Throat:   Normal external inspection ears/nares  Normal mouth/lips/gums, MMM  normal TM  posterior pharynx without erythema, without exudate  nasal mucosa normal  Skin:  Normal  inspection, no rash or concerning lesions noted on limited exam  Neck:   No masses, trachea midline. normal lymph nodes  Respiratory:   Normal respiratory effort.   No  wheeze/rhonchi/rales  Cardiovascular:   S1/S2 normal, no murmur/rub/gallop auscultated. RRR.    ASSESSMENT/PLAN: Call if no better after 5 days and will escalate therapy. No signs/symptoms of asthma exacerbation.   Acute bacterial sinusitis - Plan: ipratropium (ATROVENT) 0.06 % nasal spray, amoxicillin-clavulanate (AUGMENTIN) 875-125 MG tablet      Patient Instructions  Over-the-Counter Medications & Home Remedies for Upper Respiratory Illness  Note: the following list assumes no pregnancy, normal liver & kidney function and no other drug interactions. Dr. Sheppard Coil has highlighted medications which are safe for you to use, but these may not be appropriate for everyone. Always ask a pharmacist or qualified medical provider if you have any questions!   Aches/Pains, Fever, Headache Acetaminophen (Tylenol) 500 mg tablets - take max 2 tablets (1000 mg) every 6 hours (4 times per day)  Ibuprofen (Motrin) 200 mg tablets - take max 4 tablets (800 mg) every 6 hours*  Sinus Congestion Prescription Atrovent as directed Nasal Saline if desired Phenylephrine (Sudafed) 10 mg tablets every 4 hours (or the 12-hour formulation)* Diphenhydramine (Benadryl) 25 mg tablets - take max 2 tablets every 4 hours  Cough & Sore Throat Prescription cough pills or syrups as directed Dextromethorphan (Robitussin, others) - cough suppressant Guaifenesin (Robitussin, Mucinex, others) - expectorant (helps cough up mucus) (Dextromethorphan and Guaifenesin also come in a combination tablet) Lozenges w/ Benzocaine + Menthol (Cepacol) Honey - as much as you want! Teas which "coat the throat" - look for ingredients Elm Bark, Licorice Root, Marshmallow Root  Other Antibiotics if these are prescribed - take ALL, even if you're feeling better  Zinc Lozenges within 24 hours of symptoms onset - mixed evidence this shortens the duration of the common cold Don't waste your money on Vitamin C or Echinacea  *Caution in patients with high blood pressure     Visit summary was printed for the patient with medications and pertinent instructions for patient to review. ER/RTC precautions reviewed. All questions answered. Return if symptoms worsen or fail to improve.

## 2016-10-27 NOTE — Patient Instructions (Signed)
Over-the-Counter Medications & Home Remedies for Upper Respiratory Illness  Note: the following list assumes no pregnancy, normal liver & kidney function and no other drug interactions. Dr. Khaliel Morey has highlighted medications which are safe for you to use, but these may not be appropriate for everyone. Always ask a pharmacist or qualified medical provider if you have any questions!   Aches/Pains, Fever, Headache Acetaminophen (Tylenol) 500 mg tablets - take max 2 tablets (1000 mg) every 6 hours (4 times per day)  Ibuprofen (Motrin) 200 mg tablets - take max 4 tablets (800 mg) every 6 hours*  Sinus Congestion Prescription Atrovent as directed Nasal Saline if desired Phenylephrine (Sudafed) 10 mg tablets every 4 hours (or the 12-hour formulation)* Diphenhydramine (Benadryl) 25 mg tablets - take max 2 tablets every 4 hours  Cough & Sore Throat Prescription cough pills or syrups as directed Dextromethorphan (Robitussin, others) - cough suppressant Guaifenesin (Robitussin, Mucinex, others) - expectorant (helps cough up mucus) (Dextromethorphan and Guaifenesin also come in a combination tablet) Lozenges w/ Benzocaine + Menthol (Cepacol) Honey - as much as you want! Teas which "coat the throat" - look for ingredients Elm Bark, Licorice Root, Marshmallow Root  Other Antibiotics if these are prescribed - take ALL, even if you're feeling better  Zinc Lozenges within 24 hours of symptoms onset - mixed evidence this shortens the duration of the common cold Don't waste your money on Vitamin C or Echinacea  *Caution in patients with high blood pressure    

## 2016-11-03 ENCOUNTER — Ambulatory Visit: Payer: BLUE CROSS/BLUE SHIELD | Admitting: Family Medicine

## 2016-11-03 ENCOUNTER — Telehealth: Payer: Self-pay

## 2016-11-03 MED ORDER — DOXYCYCLINE HYCLATE 100 MG PO TABS: 100.0000 mg | ORAL_TABLET | Freq: Two times a day (BID) | ORAL | 0 refills | Status: DC

## 2016-11-03 NOTE — Telephone Encounter (Signed)
Escalated antibiotic therapy to doxycycline, since to [facility]. If no better after this, patient will require office visit to discuss CT of the sinuses versus ENT referral or other workup

## 2016-11-03 NOTE — Telephone Encounter (Signed)
Patient called stated that  She was seen on 10/27/2016 for sinus infection which is no better after completing antibiotic please advise patient on what she should do. I did offer patient to come in for an office visit to be further evaluated but she insisted that I let the doctor know to see if something else could be called in. Please advise. Tyrees Chopin,CMA

## 2016-11-03 NOTE — Telephone Encounter (Signed)
Called left a message on patient vm advising that Rx has been sent to her pharmacy. Lowen Mansouri,CMA

## 2016-11-18 ENCOUNTER — Ambulatory Visit: Payer: BLUE CROSS/BLUE SHIELD | Admitting: Physician Assistant

## 2016-11-18 ENCOUNTER — Encounter: Payer: Self-pay | Admitting: Family Medicine

## 2016-11-18 ENCOUNTER — Ambulatory Visit (INDEPENDENT_AMBULATORY_CARE_PROVIDER_SITE_OTHER): Payer: BLUE CROSS/BLUE SHIELD | Admitting: Family Medicine

## 2016-11-18 VITALS — BP 114/73 | HR 96 | Temp 98.0°F | Wt 131.0 lb

## 2016-11-18 DIAGNOSIS — F3342 Major depressive disorder, recurrent, in full remission: Secondary | ICD-10-CM

## 2016-11-18 DIAGNOSIS — T50Z95A Adverse effect of other vaccines and biological substances, initial encounter: Secondary | ICD-10-CM | POA: Diagnosis not present

## 2016-11-18 NOTE — Progress Notes (Signed)
Leslie Roberson is a 61 y.o. female who presents to [facility]: [facility] today for fevers and body aches. Patient received her second Shingrix vaccine 2 days ago. Yesterday she developed body aches and fevers. She is feeling a bit better today after taking ibuprofen. She denies any cough congestion and runny nose or rash. She feels well otherwise.  From a mental standpoint she is doing better than she was last month. She has decreased her Abilify dose to 1 mg which seems to be working well. She has a follow-up appointment with psychiatry on the 30th.   Past Medical History:  Diagnosis Date  . Anxiety   . Asthma, chronic 11/27/2015  . Depression   . Diverticulitis of colon 12/21/2015   Colonoscopy every 5 years/digestive health.   . GERD (gastroesophageal reflux disease) 11/27/2015  . Idiopathic scoliosis 01/22/2016  . Osteoporosis 11/27/2015   On prolia.   . Thyroid disease    Past Surgical History:  Procedure Laterality Date  . ABDOMINAL HYSTERECTOMY    . bladder mesh    . ELBOW SURGERY Right   . SPINAL FUSION    . TONSILLECTOMY    . TOTAL HIP ARTHROPLASTY     Social History  Substance Use Topics  . Smoking status: Never Smoker  . Smokeless tobacco: Never Used  . Alcohol use Yes   family history includes COPD in her paternal aunt; Cancer in her mother; Depression in her mother; Diabetes in her maternal aunt; Heart attack in her father; Hyperlipidemia in her father and paternal aunt; Hypertension in her father.  ROS as above:  Medications: Current Outpatient Prescriptions  Medication Sig Dispense Refill  . albuterol (PROVENTIL HFA;VENTOLIN HFA) 108 (90 Base) MCG/ACT inhaler Inhale 1-2 puffs into the lungs every 4 (four) hours as needed for wheezing or shortness of breath. 1 Inhaler 3  . ARIPiprazole (ABILIFY) 2 MG tablet Take 0.5 tablets (1 mg total) by mouth  daily. 45 tablet 0  . busPIRone (BUSPAR) 30 MG tablet Take 1 tablet (30 mg total) by mouth 2 (two) times daily. 60 tablet 5  . clonazePAM (KLONOPIN) 2 MG tablet Take 1.5 tablets (3 mg total) by mouth daily. 45 tablet 5  . denosumab (PROLIA) 60 MG/ML SOLN injection Inject 60 mg into the skin every 6 (six) months. Administer in upper arm, thigh, or abdomen    . desvenlafaxine (PRISTIQ) 100 MG 24 hr tablet TAKE 1 TABLET BY MOUTH DAILY 30 tablet 4  . desvenlafaxine (PRISTIQ) 50 MG 24 hr tablet TAKE 1 TABLET BY MOUTH DAILY 30 tablet 4  . DEXILANT 60 MG capsule TAKE 1 CAPSULE (60 MG) BY MOUTH DAILY 30 capsule 3  . diclofenac sodium (VOLTAREN) 1 % GEL APPLY 4G TOPICALLY 4 TIMES DAILY 200 g 2  . doxycycline (VIBRA-TABS) 100 MG tablet Take 1 tablet (100 mg total) by mouth 2 (two) times daily. For one week. If no better, will need office visit 14 tablet 0  . flunisolide (NASAREL) 29 MCG/ACT (0.025%) nasal spray Place 2 sprays into the nose daily as needed for rhinitis. Dose is for each nostril.    Marland Kitchen gabapentin (NEURONTIN) 300 MG capsule TAKE 1 CAP AT BEDTIME X1 WEEK,1 CAP TWICE DAILY X1 WEEK THEN 1 CAP 3X DAILY MAY DOUBLE WEEKLY=3600MG  180 capsule 3  . ipratropium (ATROVENT) 0.06 % nasal spray Place 2 sprays into both nostrils 4 (four) times daily. 15 mL 1  . levothyroxine (SYNTHROID, LEVOTHROID) 125 MCG  tablet TAKE ONE-HALF TABLET BY MOUTH ONCE DAILY 30 tablet 2  . naproxen sodium (ANAPROX) 220 MG tablet Take 220 mg by mouth as needed.    . Omega-3 Fatty Acids (FISH OIL PO) Take 1 tablet by mouth daily.    . traZODone (DESYREL) 50 MG tablet TAKE 1 TABLET (50 MG TOTAL) BY MOUTH AT BEDTIME. 90 tablet 1  . triamcinolone ointment (KENALOG) 0.1 % Apply 1 application topically 2 (two) times daily. To affected area(s) as needed. Use no longer than 1-2 weeks. 30 g 0   No current facility-administered medications for this visit.    Allergies  Allergen Reactions  . Levofloxacin Other (See Comments)     hallucinations  . Adhesive [Tape]   . Demerol [Meperidine] Nausea And Vomiting and Other (See Comments)    Doesn't work well for my pain.  . Dilaudid [Hydromorphone] Rash    Health Maintenance Health Maintenance  Topic Date Due  . INFLUENZA VACCINE  10/29/2016  . HIV Screening  12/24/2016 (Originally 09/18/1970)  . MAMMOGRAM  12/25/2017  . DEXA SCAN  12/25/2017  . PAP SMEAR  01/30/2019  . COLONOSCOPY  12/19/2020  . TETANUS/TDAP  01/29/2026  . Hepatitis C Screening  Completed     Exam:  BP 114/73   Pulse 96   Temp 98 F (36.7 C) (Oral)   Wt 131 lb (59.4 kg)   SpO2 96%   BMI 27.38 kg/m  Gen: Well NAD HEENT: EOMI,  MMM Lungs: Normal work of breathing. CTABL Heart: RRR no MRG Abd: NABS, Soft. Nondistended, Nontender Exts: Brisk capillary refill, warm and well perfused.  Skin: No rash Psych alert and oriented normal process and affect.   No results found for this or any previous visit (from the past 72 hour(s)). No results found.    Assessment and Plan: 61 y.o. female with  Vaccine reaction likely due to Shingrix vaccine. This systemic reactions that interfere with ability to do things occur about 10% of the time. She is improving and does not display any concerning physical exam signs or concerning symptoms today. Plan for watchful waiting.  From a mental health standpoint she seems to be doing well. Agree with follow-up appointment with psychiatry. Recheck with me as needed. Recommend follow-up with PCP in about a month.    No orders of the defined types were placed in this encounter.  No orders of the defined types were placed in this encounter.    Discussed warning signs or symptoms. Please see discharge instructions. Patient expresses understanding.

## 2016-11-18 NOTE — Patient Instructions (Signed)
Thank you for coming in today. Recheck as needed.  I think the vaccine reaction is the cause.

## 2016-11-19 ENCOUNTER — Ambulatory Visit: Payer: BLUE CROSS/BLUE SHIELD | Admitting: Physician Assistant

## 2016-11-24 ENCOUNTER — Ambulatory Visit (INDEPENDENT_AMBULATORY_CARE_PROVIDER_SITE_OTHER): Payer: BLUE CROSS/BLUE SHIELD | Admitting: Physician Assistant

## 2016-11-24 ENCOUNTER — Encounter: Payer: Self-pay | Admitting: Physician Assistant

## 2016-11-24 VITALS — BP 114/74 | HR 84 | Temp 98.5°F | Ht <= 58 in | Wt 131.2 lb

## 2016-11-24 DIAGNOSIS — J0141 Acute recurrent pansinusitis: Secondary | ICD-10-CM

## 2016-11-24 DIAGNOSIS — R197 Diarrhea, unspecified: Secondary | ICD-10-CM

## 2016-11-24 NOTE — Patient Instructions (Addendum)

## 2016-11-24 NOTE — Progress Notes (Signed)
Subjective:    Patient ID: Leslie Roberson, female    DOB: [birth year], 61 y.o.   MRN: [id number]  HPI  Pt is a 61 yo female who presents to the clinic with persistent sinusitis symptoms of maxillary and frontal sinuses. She was seen in clinic and given augmentin. She finished augmentin and then was given doxycycline. She finished doxycycline this week and still having symptoms. She is better but still having pressure, headaches, facial pain. She is also using ocean nasal spray, vicks spray, and flonase with no relief.   .. Active Ambulatory Problems    Diagnosis Date Noted  . GERD (gastroesophageal reflux disease) 11/27/2015  . Osteoporosis 11/27/2015  . GAD (generalized anxiety disorder) 11/27/2015  . MDD (major depressive disorder), recurrent, in full remission ([place]) 11/27/2015  . Chronic dryness of both eyes 11/27/2015  . Hypothyroidism 11/27/2015  . Asthma, chronic 11/27/2015  . Insomnia 11/27/2015  . Seborrheic keratoses 11/27/2015  . Diverticulitis of colon 12/21/2015  . Hip bursitis 12/25/2015  . Biceps tendonitis on right 12/25/2015  . Idiopathic scoliosis 01/22/2016  . Vaginal atrophy 01/31/2016  . Rosacea 02/04/2016  . Right knee pain 02/19/2016  . Paresthesia 04/03/2016  . Facet hypertrophy of lumbosacral region 04/03/2016  . Chronic back pain 04/03/2016  . Bad taste in mouth 07/26/2016  . No energy 09/15/2016   Resolved Ambulatory Problems    Diagnosis Date Noted  . Acute medial meniscus tear 04/23/2016  . Left knee pain 04/28/2016  . Mild intermittent asthma without complication 28/36/6294   Past Medical History:  Diagnosis Date  . Anxiety   . Asthma, chronic 11/27/2015  . Depression   . Diverticulitis of colon 12/21/2015  . GERD (gastroesophageal reflux disease) 11/27/2015  . Idiopathic scoliosis 01/22/2016  . Osteoporosis 11/27/2015  . Thyroid disease       Review of Systems    see HPI.  Objective:   Physical Exam  Constitutional: She is  oriented to person, place, and time. She appears well-developed and well-nourished.  HENT:  Head: Normocephalic and atraumatic.  Right Ear: External ear normal.  Left Ear: External ear normal.  TM's clear bilaterally.  Tenderness over maxillary, frontal, nasal bridge to palpation.  Bilateral nasal turbinates red and swollen.  Oropharynx erythematous without any tonsil swelling or exudate.   Eyes: Conjunctivae are normal. Right eye exhibits no discharge. Left eye exhibits no discharge.  Neck: Normal range of motion. Neck supple.  Cardiovascular: Normal rate, regular rhythm and normal heart sounds.   Pulmonary/Chest: Effort normal and breath sounds normal. She has no wheezes.  Lymphadenopathy:    She has no cervical adenopathy.  Neurological: She is alert and oriented to person, place, and time.  Psychiatric: She has a normal mood and affect. Her behavior is normal.          Assessment & Plan:  Marland KitchenMarland KitchenNikeria was seen today for facial pain.  Diagnoses and all orders for this visit:  Acute recurrent pansinusitis -     CT Maxillofacial WO CM; Future -     CT Maxillofacial WO CM  Diarrhea, unspecified type   Due to already trying 2 abx. Will get CT of sinuses before proceeding. If she needs abx treatment for chronic sinusitis will start her on this. Continue with symptomatic care. Will consider ENT referral as well.  Discussed conservative management for diarrhea. Likely due to 2 abx in the past month. Start probiotic therapy and BRAT diet. Follow up as needed. If not improving or worsening will need  to test for C.diff.

## 2016-11-25 ENCOUNTER — Ambulatory Visit (INDEPENDENT_AMBULATORY_CARE_PROVIDER_SITE_OTHER): Payer: BLUE CROSS/BLUE SHIELD

## 2016-11-25 DIAGNOSIS — R51 Headache: Secondary | ICD-10-CM | POA: Diagnosis not present

## 2016-11-25 DIAGNOSIS — J0141 Acute recurrent pansinusitis: Secondary | ICD-10-CM

## 2016-11-25 IMAGING — CT CT MAXILLOFACIAL W/O CM
3 series · 16 of 47 positions shown, 19 images · non-contrast
Comparison: None.

CLINICAL DATA: Sinus and facial pain for 3 months.

EXAM:
CT MAXILLOFACIAL WITHOUT CONTRAST
TECHNIQUE: Multidetector CT imaging of the maxillofacial structures was
performed. Multiplanar CT image reconstructions were also generated.

[Series 2: max soft · axial · 0.35mm/px · z∈[-250,-112]mm · 10 of 81 slices shown, 13 images]
[im 6/81  brain]
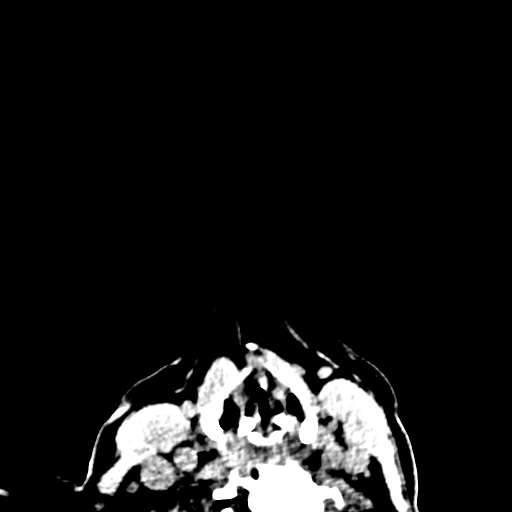
[im 6/81  bone]
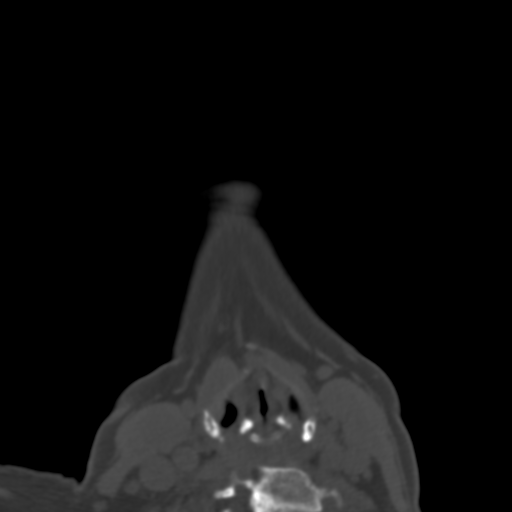
[im 14/81  bone]
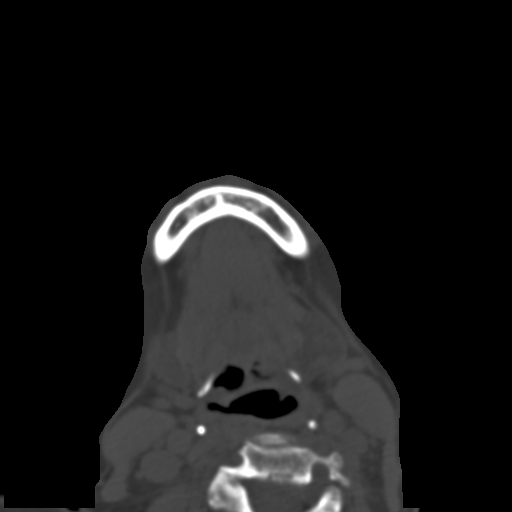
[im 23/81  bone]
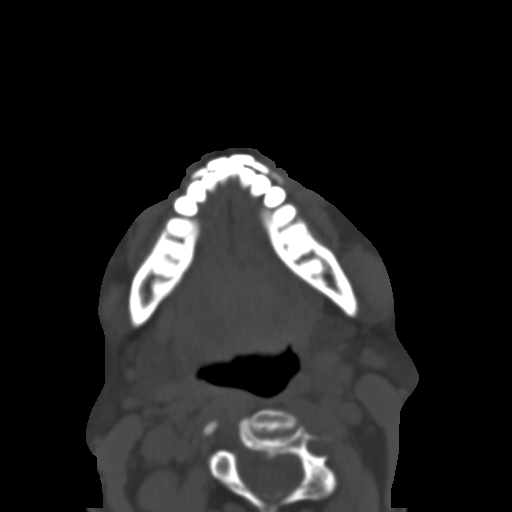
[im 28/81  bone]
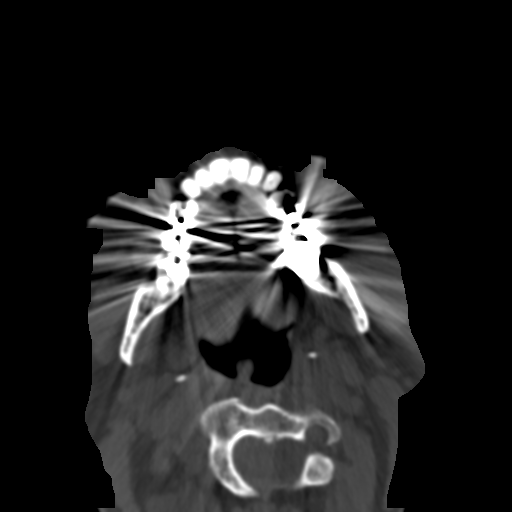
[im 36/81  brain]
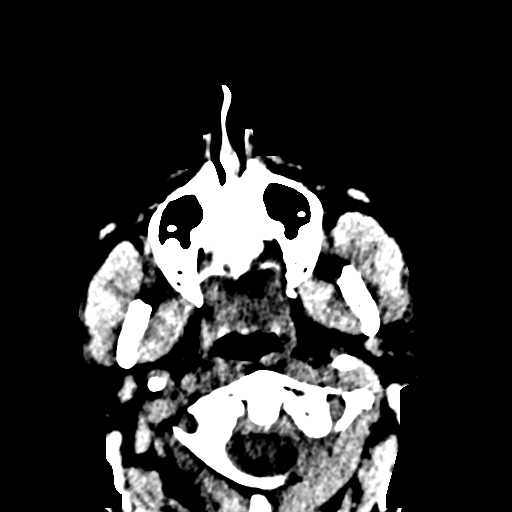
[im 36/81  bone]
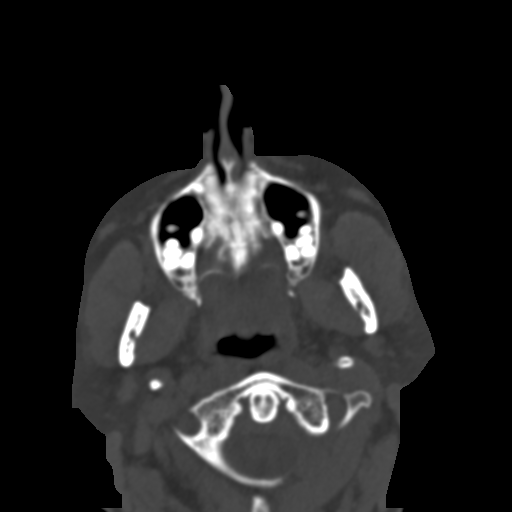
[im 45/81  bone]
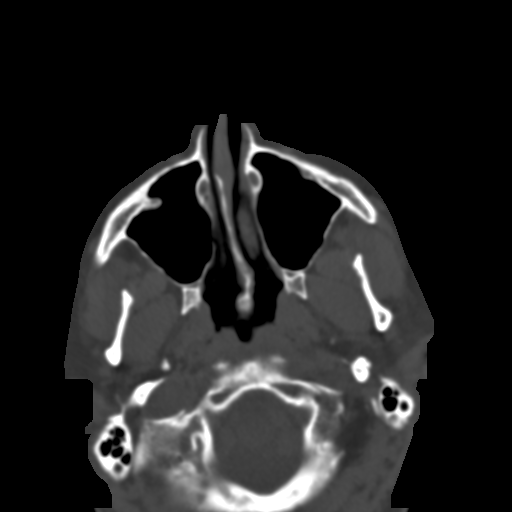
[im 53/81  bone]
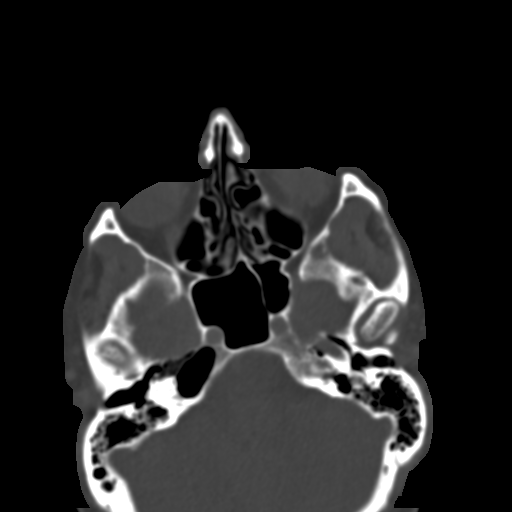
[im 61/81  bone]
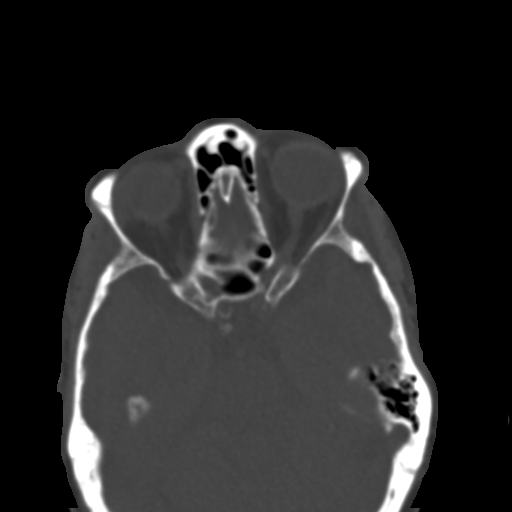
[im 67/81  brain]
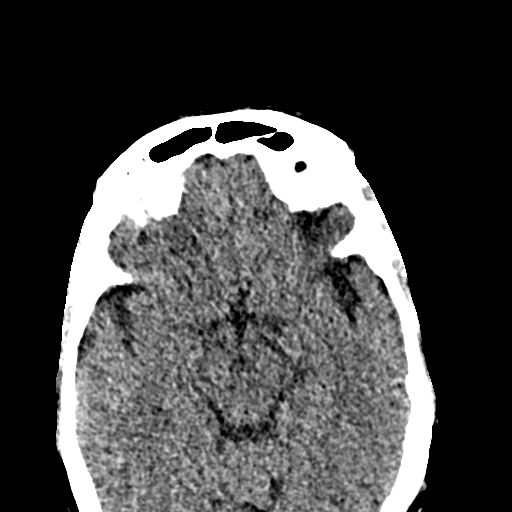
[im 67/81  bone]
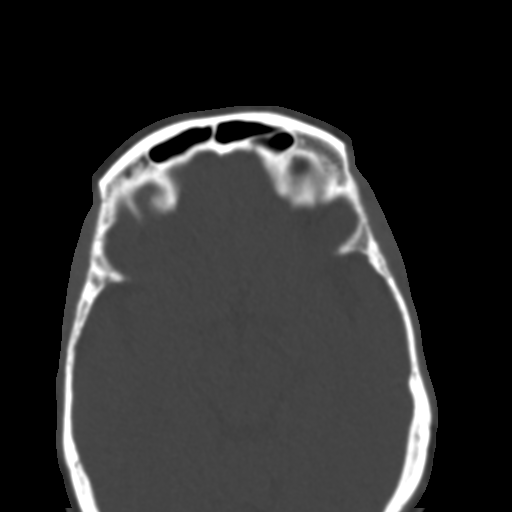
[im 75/81  bone]
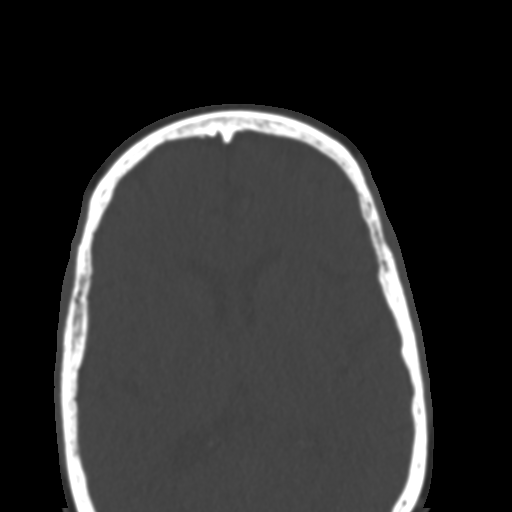

[Series 4: coronal soft · coronal · 0.32mm/px · 3 of 85 slices shown]
[im 29/85  bone]
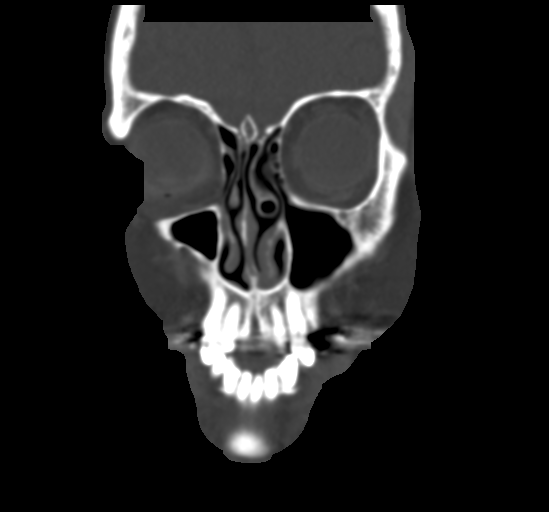
[im 38/85  bone]
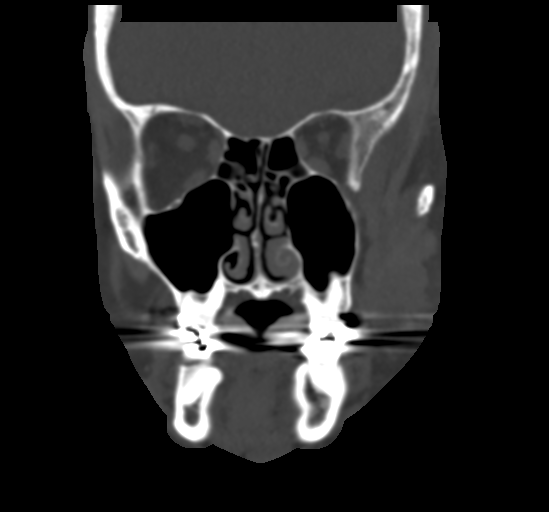
[im 47/85  bone]
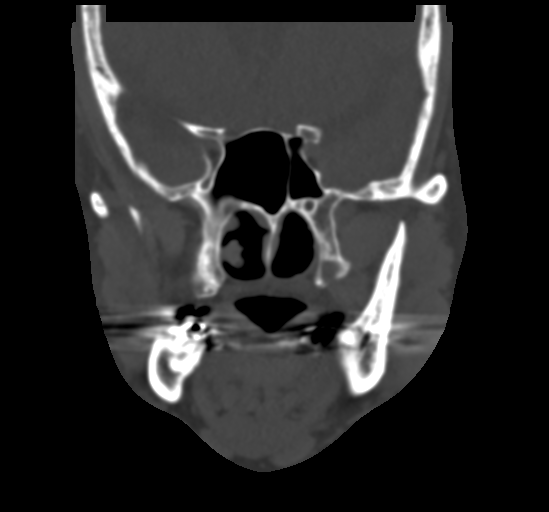

[Series 5: sagittal soft · sagittal · 0.33mm/px · 3 of 91 slices shown]
[im 31/91  bone]
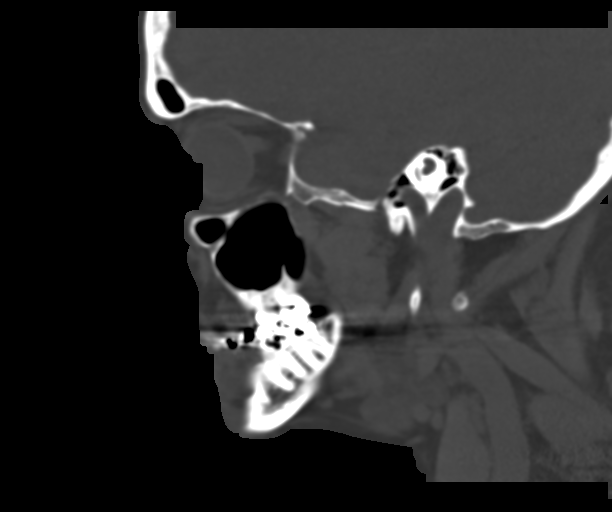
[im 46/91  bone]
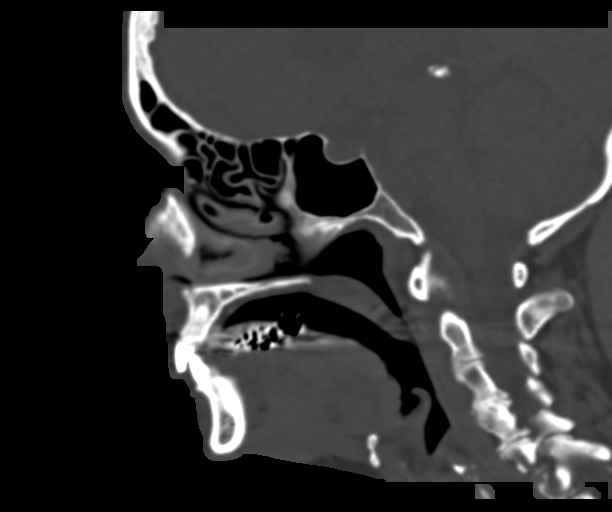
[im 61/91  bone]
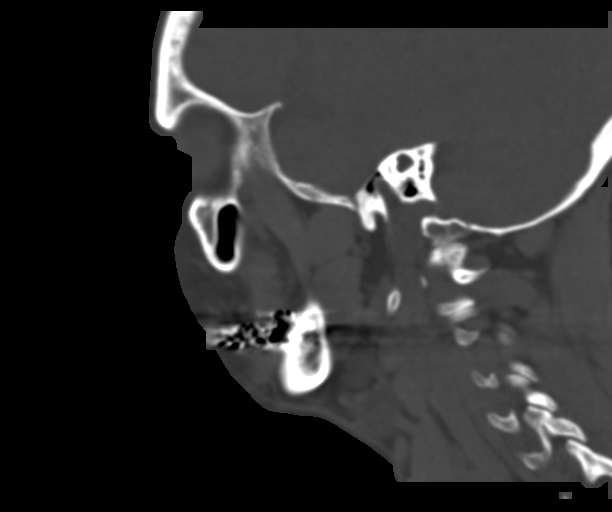

[16 of 47 positions shown; findings below may reference images not displayed]

FINDINGS: Osseous: No fracture or mandibular dislocation. No destructive
process.

Orbits: Negative. No traumatic or inflammatory finding.

Sinuses: Clear

Soft tissues: Negative

Limited intracranial: No significant or unexpected finding.
IMPRESSION: Unremarkable facial CT.

## 2016-11-25 NOTE — Progress Notes (Signed)
Unremarkable facial CT. Sinuses are clear. I know you don't want to do prednisone but we could try this to get rid of inflammation? Perhaps it could be some inflammation causing this pain.

## 2016-11-27 ENCOUNTER — Encounter (HOSPITAL_COMMUNITY): Payer: Self-pay | Admitting: Psychiatry

## 2016-11-27 ENCOUNTER — Telehealth: Payer: Self-pay | Admitting: Physician Assistant

## 2016-11-27 ENCOUNTER — Ambulatory Visit (INDEPENDENT_AMBULATORY_CARE_PROVIDER_SITE_OTHER): Payer: BLUE CROSS/BLUE SHIELD | Admitting: Psychiatry

## 2016-11-27 VITALS — BP 116/72 | HR 99 | Resp 16 | Ht <= 58 in | Wt 131.6 lb

## 2016-11-27 DIAGNOSIS — R635 Abnormal weight gain: Secondary | ICD-10-CM | POA: Diagnosis not present

## 2016-11-27 DIAGNOSIS — Z818 Family history of other mental and behavioral disorders: Secondary | ICD-10-CM

## 2016-11-27 DIAGNOSIS — F331 Major depressive disorder, recurrent, moderate: Secondary | ICD-10-CM | POA: Diagnosis not present

## 2016-11-27 DIAGNOSIS — Z634 Disappearance and death of family member: Secondary | ICD-10-CM

## 2016-11-27 DIAGNOSIS — Z62810 Personal history of physical and sexual abuse in childhood: Secondary | ICD-10-CM

## 2016-11-27 DIAGNOSIS — G4700 Insomnia, unspecified: Secondary | ICD-10-CM

## 2016-11-27 DIAGNOSIS — F5102 Adjustment insomnia: Secondary | ICD-10-CM | POA: Diagnosis not present

## 2016-11-27 DIAGNOSIS — Z9149 Other personal history of psychological trauma, not elsewhere classified: Secondary | ICD-10-CM | POA: Diagnosis not present

## 2016-11-27 DIAGNOSIS — F411 Generalized anxiety disorder: Secondary | ICD-10-CM

## 2016-11-27 NOTE — Telephone Encounter (Signed)
Pt called or CT results.  She is in a lot of pain.  She has a Dr's appt at 11:00 and may not be available.  Thank you.
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
## 2016-11-27 NOTE — Telephone Encounter (Signed)
I resulted this note from her CT at least 2 days ago and has not been called. Can we please call patient and let her know results. See CT result note.

## 2016-11-28 ENCOUNTER — Other Ambulatory Visit: Payer: Self-pay | Admitting: Physician Assistant

## 2016-11-28 MED ORDER — PREDNISONE 50 MG PO TABS: ORAL_TABLET | ORAL | 0 refills | Status: DC

## 2016-11-28 NOTE — Telephone Encounter (Signed)
Sent prednisone burst.

## 2016-11-28 NOTE — Telephone Encounter (Signed)
Pt called back & was given results.  She is willing to do a burst of prednisone if it will help with pain.

## 2016-11-28 NOTE — Telephone Encounter (Signed)
LMOM for pt to return call for results. 

## 2016-12-02 ENCOUNTER — Other Ambulatory Visit (HOSPITAL_COMMUNITY): Payer: Self-pay | Admitting: *Deleted

## 2016-12-02 NOTE — Telephone Encounter (Signed)
Pt states she is not sleeping and has more anxiety since changing medications at the last visit. Please advise.   Pt's # [phone].

## 2016-12-02 NOTE — Telephone Encounter (Signed)
Add vistaril 25mg  during the day or for night sleep .

## 2016-12-03 MED ORDER — HYDROXYZINE PAMOATE 25 MG PO CAPS: 25.0000 mg | ORAL_CAPSULE | Freq: Every day | ORAL | 0 refills | Status: DC

## 2016-12-03 NOTE — Telephone Encounter (Signed)
Medication management- return telephone call to pt. Per dr. De Nurse, please inform pt a new rx for Vistaril 25mg , #30 was sent to [facility]. Pt was informed of prescription and directions. Informed pt if any symptoms, pt may contact office or proceed to local urgent care. Pt's next apt is schedule on 12/18/16. Pt shows understanding.

## 2016-12-08 ENCOUNTER — Telehealth (HOSPITAL_COMMUNITY): Payer: Self-pay | Admitting: Psychiatry

## 2016-12-08 NOTE — Telephone Encounter (Signed)
Pt called to report she stopped taking the desyrel. It was causing her acid reflux and making her a "walking zombie"  Now she is not sleeping and would like recommendations on what to do next.   Please advise.

## 2016-12-08 NOTE — Telephone Encounter (Signed)
She is on klonopine which is advised to be on lower dose. It should still help her sleep. Gabapentin also aids sleep.  Work on sleep hygiene as well. Stop trazadone if having side effects. If needed can get vistaril 25mg  qhs but then no klonopine with it

## 2016-12-09 ENCOUNTER — Other Ambulatory Visit: Payer: Self-pay | Admitting: Physician Assistant

## 2016-12-09 DIAGNOSIS — F411 Generalized anxiety disorder: Secondary | ICD-10-CM

## 2016-12-09 DIAGNOSIS — F3342 Major depressive disorder, recurrent, in full remission: Secondary | ICD-10-CM

## 2016-12-09 NOTE — Telephone Encounter (Signed)
Pt return call to office. Pt states she stopped Vistaril on 12/07/16. Pt states Vistaril was causing acid reflux and making her a "walking zombie".   Per Dr. De Nurse, please informed pt to work on sleep hygiene.  Pt is currently taking Klonopin 1 mg at night, and  reduce Buspar 40mg  daily. Pt verbalizes understanding.  Nothing further is needed at this time.

## 2016-12-09 NOTE — Telephone Encounter (Signed)
Lvm for pt to return call to office.

## 2016-12-11 ENCOUNTER — Other Ambulatory Visit: Payer: Self-pay | Admitting: *Deleted

## 2016-12-11 MED ORDER — DEXLANSOPRAZOLE 60 MG PO CPDR: DELAYED_RELEASE_CAPSULE | ORAL | 0 refills | Status: DC

## 2016-12-15 ENCOUNTER — Ambulatory Visit (INDEPENDENT_AMBULATORY_CARE_PROVIDER_SITE_OTHER): Payer: BLUE CROSS/BLUE SHIELD | Admitting: Physician Assistant

## 2016-12-15 ENCOUNTER — Encounter: Payer: Self-pay | Admitting: Physician Assistant

## 2016-12-15 VITALS — BP 114/69 | HR 86 | Ht <= 58 in | Wt 134.0 lb

## 2016-12-15 DIAGNOSIS — F3342 Major depressive disorder, recurrent, in full remission: Secondary | ICD-10-CM | POA: Diagnosis not present

## 2016-12-15 DIAGNOSIS — M810 Age-related osteoporosis without current pathological fracture: Secondary | ICD-10-CM

## 2016-12-15 DIAGNOSIS — Z23 Encounter for immunization: Secondary | ICD-10-CM

## 2016-12-15 DIAGNOSIS — J329 Chronic sinusitis, unspecified: Secondary | ICD-10-CM

## 2016-12-15 NOTE — Telephone Encounter (Signed)
Patient received a bill for prolia for 2000 dollars. Isn't this pre approved? Can we find out what is going on here.

## 2016-12-15 NOTE — Progress Notes (Signed)
   Subjective:    Patient ID: Leslie Roberson, female    DOB: July 14, 1955, 61 y.o.   MRN: [id number]  HPI   Pt is a 61 yo female who presents to the clinic to follow up after CT of sinuses was clear. Pt had been on 2 rounds of abx and still had a lot of sinus pressure. After negative CT prednisone was given but never taken. She was given vistaril from Dr. Primitivo Gauze for anxiety and since antihistamine figured it would help with sinuses. It did but also made her very sleepy she has had to stop due to sleepiness. She continues to feel relief from sinuses.   MDD/anxiety- managed by Dr. Primitivo Gauze. He does plan to wean her off of benzodiazapine's.    Patient is upset about a 2000 prolia bill. She wants to know if we got it pre-approved. She has tried fosamax but it worsened her acid reflux and she would often for get the dose.    Review of Systems    see HPI.  Objective:   Physical Exam  Constitutional: She is oriented to person, place, and time. She appears well-developed and well-nourished.  HENT:  Head: Normocephalic and atraumatic.  Right Ear: External ear normal.  Left Ear: External ear normal.  Nose: Nose normal.  Mouth/Throat: Oropharynx is clear and moist. No oropharyngeal exudate.  No sinus tenderness to palpation.   Eyes: Conjunctivae are normal. Right eye exhibits no discharge. Left eye exhibits no discharge.  Neck: Normal range of motion. Neck supple.  Cardiovascular: Normal rate, regular rhythm and normal heart sounds.   Pulmonary/Chest: Effort normal and breath sounds normal.  Lymphadenopathy:    She has no cervical adenopathy.  Neurological: She is alert and oriented to person, place, and time.  Psychiatric: She has a normal mood and affect. Her behavior is normal.          Assessment & Plan:  Marland KitchenMarland KitchenDiagnoses and all orders for this visit:  Recurrent sinusitis  Influenza vaccine needed -     Flu Vaccine QUAD 6+ mos PF IM (Fluarix Quad PF)  MDD (major depressive  disorder), recurrent, in full remission ([place])  Osteoporosis, unspecified osteoporosis type, unspecified pathological fracture presence   Recurrent sinusitis- discussed to start daily Claritin and as needed ipratropium nasal spray. Follow up if sinus issues persist.   Continue with Dr. Primitivo Gauze for management of MDD.   Will msg kelsi and see if PA was done on prolia before given and see if everything documented correctly.

## 2016-12-18 ENCOUNTER — Ambulatory Visit (INDEPENDENT_AMBULATORY_CARE_PROVIDER_SITE_OTHER): Payer: BLUE CROSS/BLUE SHIELD | Admitting: Psychiatry

## 2016-12-18 ENCOUNTER — Encounter (HOSPITAL_COMMUNITY): Payer: Self-pay | Admitting: Psychiatry

## 2016-12-18 VITALS — BP 118/72 | HR 93 | Resp 16 | Ht <= 58 in | Wt 132.4 lb

## 2016-12-18 DIAGNOSIS — F411 Generalized anxiety disorder: Secondary | ICD-10-CM | POA: Diagnosis not present

## 2016-12-18 DIAGNOSIS — F5102 Adjustment insomnia: Secondary | ICD-10-CM | POA: Diagnosis not present

## 2016-12-18 DIAGNOSIS — F331 Major depressive disorder, recurrent, moderate: Secondary | ICD-10-CM

## 2016-12-18 DIAGNOSIS — Z634 Disappearance and death of family member: Secondary | ICD-10-CM

## 2016-12-18 DIAGNOSIS — Z818 Family history of other mental and behavioral disorders: Secondary | ICD-10-CM

## 2016-12-18 DIAGNOSIS — Z6379 Other stressful life events affecting family and household: Secondary | ICD-10-CM

## 2016-12-18 DIAGNOSIS — Z9149 Other personal history of psychological trauma, not elsewhere classified: Secondary | ICD-10-CM
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
## 2016-12-20 ENCOUNTER — Other Ambulatory Visit: Payer: Self-pay | Admitting: Physician Assistant

## 2016-12-22 ENCOUNTER — Ambulatory Visit (INDEPENDENT_AMBULATORY_CARE_PROVIDER_SITE_OTHER): Payer: BLUE CROSS/BLUE SHIELD | Admitting: Family Medicine

## 2016-12-22 ENCOUNTER — Encounter: Payer: Self-pay | Admitting: Family Medicine

## 2016-12-22 VITALS — BP 90/73 | HR 85 | Wt 133.0 lb

## 2016-12-22 DIAGNOSIS — M7061 Trochanteric bursitis, right hip: Secondary | ICD-10-CM

## 2016-12-22 DIAGNOSIS — M7062 Trochanteric bursitis, left hip: Secondary | ICD-10-CM

## 2016-12-22 MED ORDER — LIDOCAINE 5 % EX PTCH: 1.0000 | MEDICATED_PATCH | Freq: Two times a day (BID) | CUTANEOUS | 12 refills | Status: DC

## 2016-12-22 MED ORDER — DICLOFENAC SODIUM 1 % TD GEL: TRANSDERMAL | 12 refills | Status: DC

## 2016-12-22 NOTE — Telephone Encounter (Signed)
This was administered in June, most insurance will not backdate that far. Will route to [place] for recommendation.

## 2016-12-22 NOTE — Telephone Encounter (Signed)
Do we send this to [facility] or does someone need to get approved first and then maybe they will back date it?

## 2016-12-22 NOTE — Telephone Encounter (Signed)
Consider calling patient I saw her in the hallway today and she stated her insurance was going to submit something to our office for further info to see if they were going to pay.

## 2016-12-22 NOTE — Patient Instructions (Addendum)
Thank you for coming in today. Restart the exercises on both hips.   Do the two stretches Do the side leg raises and to the back side leg raises.  Do the band clam shell exercises Do about 30 reps 2 sets on both sides.   Recheck as needed.   Continue the voltaren gel.

## 2016-12-22 NOTE — Telephone Encounter (Signed)
Yes, there is supposed to be a PA completed. I do not see where this was completed prior to administration in June.

## 2016-12-22 NOTE — Progress Notes (Signed)
Leslie Roberson is a 61 y.o. female who presents to [facility] today for right hip pain.  Patient reports right hip pain for about 1 month. Patient has a limb length discrepancy and a history of right greater trochanteric bursitis about 1 year ago. She reports that her current symptoms are similar to this episode. Patient reports that pain is worst when laying on right side and disrupts her sleep. Patient has not had any pain with walking or climbing stairs. She has tried applying Icy-Hot and Voltaren gel to the area, but this only relieved her symptoms temporarily. Patient reports that she has previously received many steroid injections, but has never had significant pain relief with them. She denies any numbness or tingling.    Past Medical History:  Diagnosis Date  . Anxiety   . Asthma, chronic 11/27/2015  . Depression   . Diverticulitis of colon 12/21/2015   Colonoscopy every 5 years/digestive health.   . GERD (gastroesophageal reflux disease) 11/27/2015  . Idiopathic scoliosis 01/22/2016  . Osteoporosis 11/27/2015   On prolia.   . Thyroid disease    Past Surgical History:  Procedure Laterality Date  . ABDOMINAL HYSTERECTOMY    . bladder mesh    . ELBOW SURGERY Right   . SPINAL FUSION    . TONSILLECTOMY    . TOTAL HIP ARTHROPLASTY     Social History  Substance Use Topics  . Smoking status: Never Smoker  . Smokeless tobacco: Never Used  . Alcohol use Yes     ROS:  As above   Medications: Current Outpatient Prescriptions  Medication Sig Dispense Refill  . albuterol (PROVENTIL HFA;VENTOLIN HFA) 108 (90 Base) MCG/ACT inhaler Inhale 1-2 puffs into the lungs every 4 (four) hours as needed for wheezing or shortness of breath. 1 Inhaler 3  . busPIRone (BUSPAR) 30 MG tablet TAKE 1 TABLET (30 MG TOTAL) BY MOUTH 2 (TWO) TIMES DAILY. 60 tablet 2  . clonazePAM (KLONOPIN) 2 MG tablet Take 1.5 tablets (3 mg total) by mouth daily. 45  tablet 5  . denosumab (PROLIA) 60 MG/ML SOLN injection Inject 60 mg into the skin every 6 (six) months. Administer in upper arm, thigh, or abdomen    . desvenlafaxine (PRISTIQ) 100 MG 24 hr tablet TAKE 1 TABLET BY MOUTH DAILY 30 tablet 4  . desvenlafaxine (PRISTIQ) 50 MG 24 hr tablet TAKE 1 TABLET BY MOUTH DAILY 30 tablet 4  . dexlansoprazole (DEXILANT) 60 MG capsule TAKE 1 CAPSULE (60 MG) BY MOUTH DAILY 90 capsule 0  . diclofenac sodium (VOLTAREN) 1 % GEL APPLY 4G TOPICALLY 4 TIMES DAILY 100 g 12  . flunisolide (NASAREL) 29 MCG/ACT (0.025%) nasal spray Place 2 sprays into the nose daily as needed for rhinitis. Dose is for each nostril.    Marland Kitchen gabapentin (NEURONTIN) 300 MG capsule TAKE 1 CAP AT BEDTIME X1 WEEK,1 CAP TWICE DAILY X1 WEEK THEN 1 CAP 3X DAILY MAY DOUBLE WEEKLY=3600MG  180 capsule 3  . ipratropium (ATROVENT) 0.06 % nasal spray Place 2 sprays into both nostrils 4 (four) times daily. 15 mL 1  . levothyroxine (SYNTHROID, LEVOTHROID) 125 MCG tablet TAKE ONE-HALF TABLET BY MOUTH ONCE DAILY 30 tablet 2  . naproxen sodium (ANAPROX) 220 MG tablet Take 220 mg by mouth as needed.    . Omega-3 Fatty Acids (FISH OIL PO) Take 1 tablet by mouth daily.    . traZODone (DESYREL) 50 MG tablet TAKE 1 TABLET (50 MG TOTAL) BY MOUTH AT BEDTIME.  90 tablet 1  . triamcinolone ointment (KENALOG) 0.1 % Apply 1 application topically 2 (two) times daily. To affected area(s) as needed. Use no longer than 1-2 weeks. 30 g 0  . lidocaine (LIDODERM) 5 % Place 1 patch onto the skin every 12 (twelve) hours. Remove & Discard patch within 12 hours or as directed by MD 30 patch 12   No current facility-administered medications for this visit.    Allergies  Allergen Reactions  . Levofloxacin Other (See Comments)    hallucinations  . Adhesive [Tape]   . Demerol [Meperidine] Nausea And Vomiting and Other (See Comments)    Doesn't work well for my pain.  . Dilaudid [Hydromorphone] Rash     Exam:  BP 90/73   Pulse 85    Wt 133 lb (60.3 kg)   BMI 27.80 kg/m  General: Well Developed, well nourished, and in no acute distress.  Neuro/Psych: Alert and oriented x3, extra-ocular muscles intact, able to move all 4 extremities, sensation grossly intact. Skin: Warm and dry, no rashes noted.  Respiratory: Not using accessory muscles, speaking in full sentences, trachea midline.  Cardiovascular: Pulses palpable, no extremity edema. Abdomen: Does not appear distended. MSK: Right hip: No gross deformity  Tenderness to palpation over area of greater trochanter Range of motion is full with flexion, extension, abduction, and adduction Strength is decreased with hip abduction and extension      Assessment and Plan: 61 y.o. female with right hip pain. Given patient's history and physical exam findings, this is most likely greater trochanteric bursitis. Patient is not able to attend physical therapy at this time. Exercises and stretches for hip abductors were reviewed in clinic. She was also given a prescription for Voltaren gel. Patient will follow-up in clinic if symptoms do not improve or worsen.    No orders of the defined types were placed in this encounter.  Meds ordered this encounter  Medications  . diclofenac sodium (VOLTAREN) 1 % GEL    Sig: APPLY 4G TOPICALLY 4 TIMES DAILY    Dispense:  100 g    Refill:  12  . lidocaine (LIDODERM) 5 %    Sig: Place 1 patch onto the skin every 12 (twelve) hours. Remove & Discard patch within 12 hours or as directed by MD    Dispense:  30 patch    Refill:  12    Discussed warning signs or symptoms. Please see discharge instructions. Patient expresses understanding.  I spent 25 minutes with this patient, greater than 50% was face-to-face time counseling regarding ddx and treatment plan.

## 2016-12-23 ENCOUNTER — Ambulatory Visit: Payer: BLUE CROSS/BLUE SHIELD | Admitting: Physician Assistant

## 2016-12-24 ENCOUNTER — Other Ambulatory Visit: Payer: Self-pay | Admitting: Physician Assistant

## 2016-12-24 DIAGNOSIS — M9902 Segmental and somatic dysfunction of thoracic region: Secondary | ICD-10-CM | POA: Diagnosis not present

## 2016-12-24 DIAGNOSIS — M9901 Segmental and somatic dysfunction of cervical region: Secondary | ICD-10-CM | POA: Diagnosis not present

## 2016-12-24 DIAGNOSIS — M791 Myalgia: Secondary | ICD-10-CM | POA: Diagnosis not present

## 2016-12-24 DIAGNOSIS — G44209 Tension-type headache, unspecified, not intractable: Secondary | ICD-10-CM | POA: Diagnosis not present

## 2016-12-25 ENCOUNTER — Other Ambulatory Visit: Payer: Self-pay | Admitting: Physician Assistant

## 2016-12-29 ENCOUNTER — Other Ambulatory Visit: Payer: Self-pay | Admitting: Physician Assistant

## 2016-12-29 DIAGNOSIS — Z1239 Encounter for other screening for malignant neoplasm of breast: Secondary | ICD-10-CM

## 2016-12-31 ENCOUNTER — Ambulatory Visit (INDEPENDENT_AMBULATORY_CARE_PROVIDER_SITE_OTHER): Payer: BLUE CROSS/BLUE SHIELD

## 2016-12-31 DIAGNOSIS — Z1231 Encounter for screening mammogram for malignant neoplasm of breast: Secondary | ICD-10-CM

## 2016-12-31 DIAGNOSIS — Z1239 Encounter for other screening for malignant neoplasm of breast: Secondary | ICD-10-CM

## 2016-12-31 IMAGING — MG DIGITAL SCREENING BILATERAL MAMMOGRAM WITH CAD
4 series · 4 of 4 positions shown · non-contrast
Comparison: Previous exam(s).

CLINICAL DATA: Screening.

EXAM:
DIGITAL SCREENING BILATERAL MAMMOGRAM WITH CAD

[R MLO]
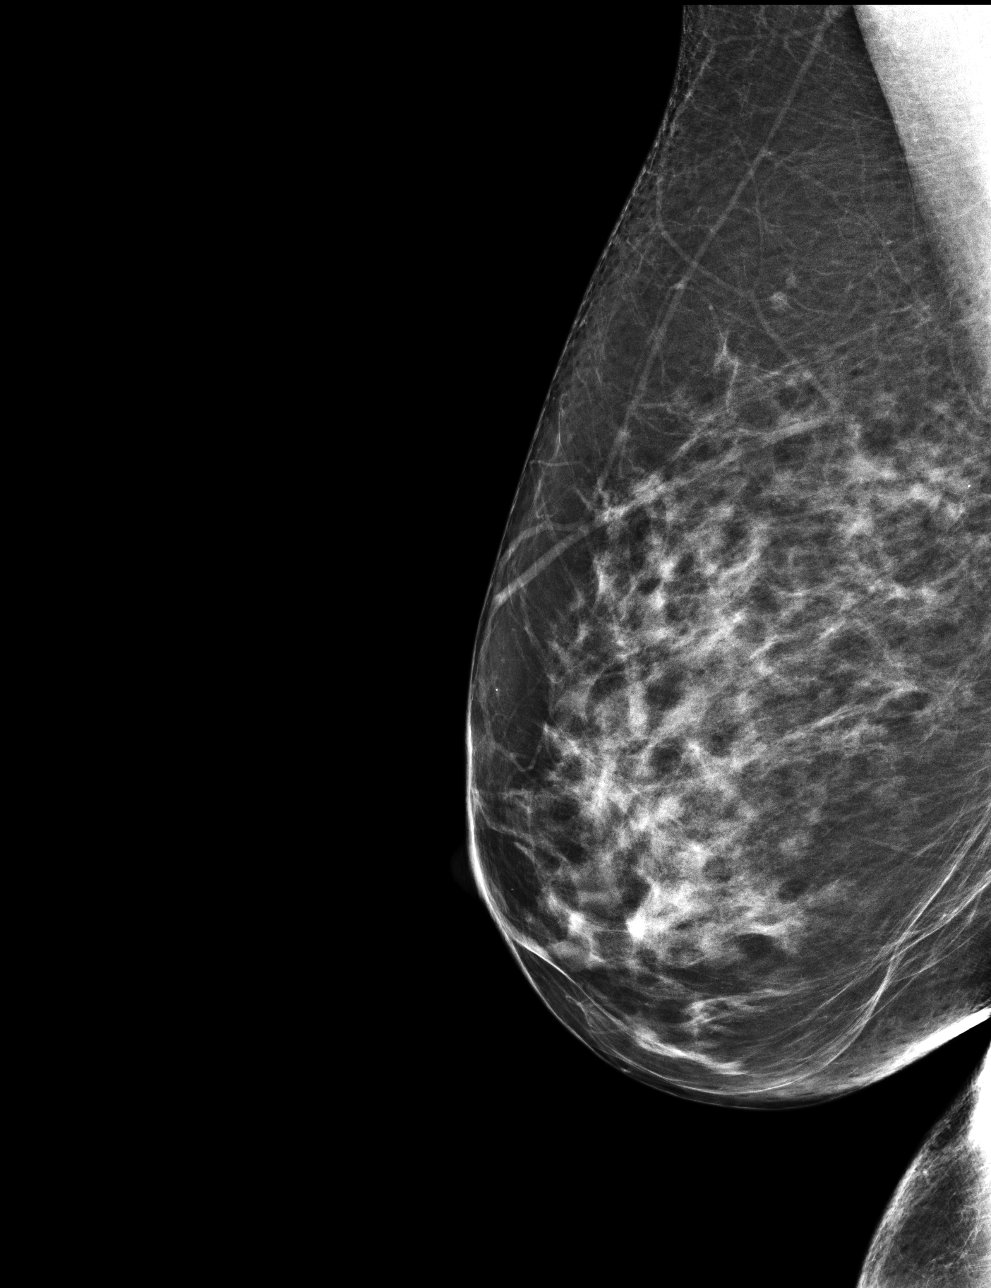

[L MLO]
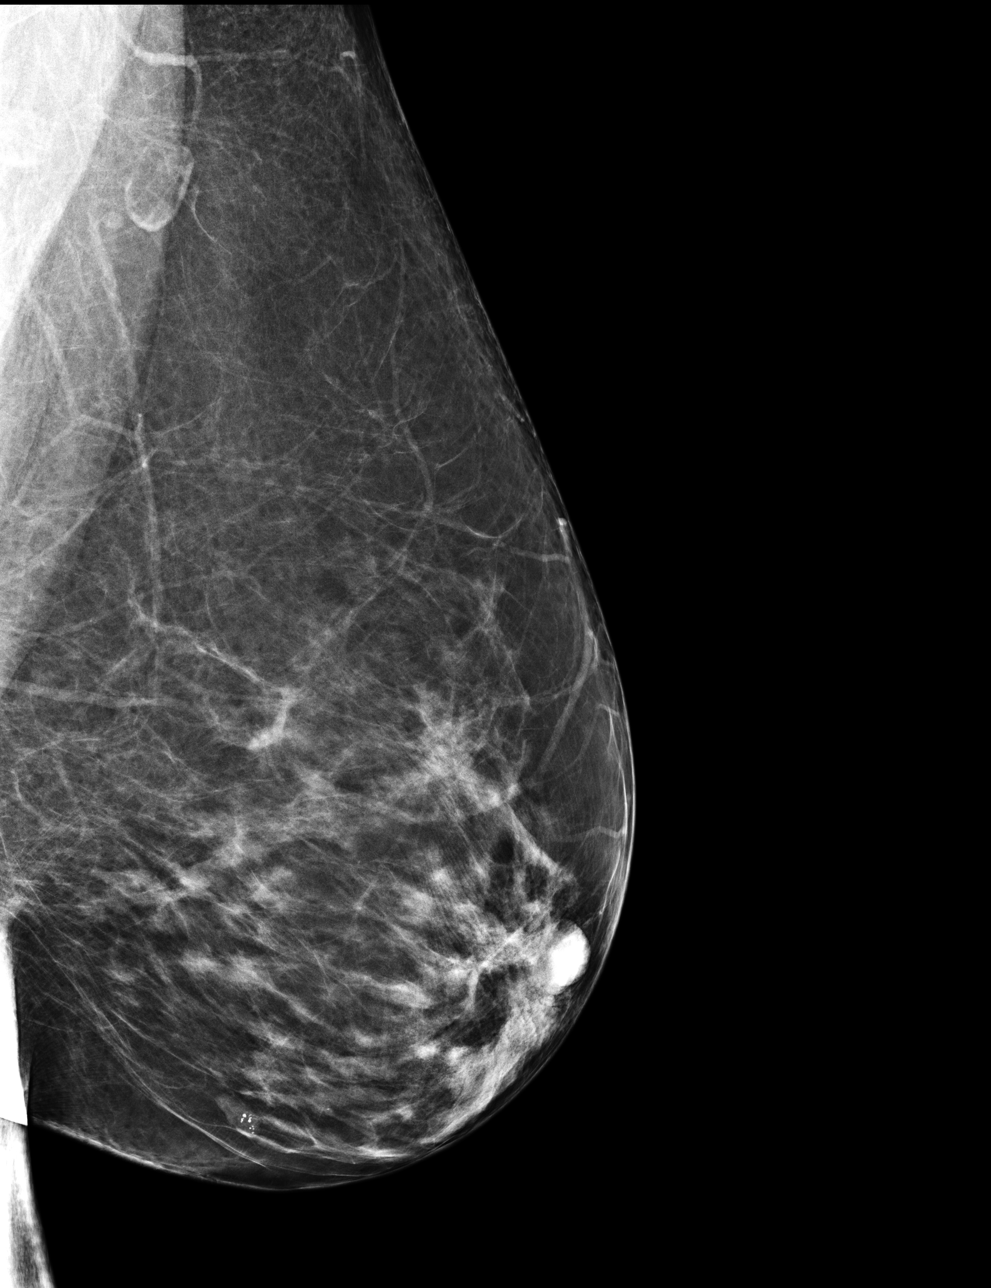

[R CC]
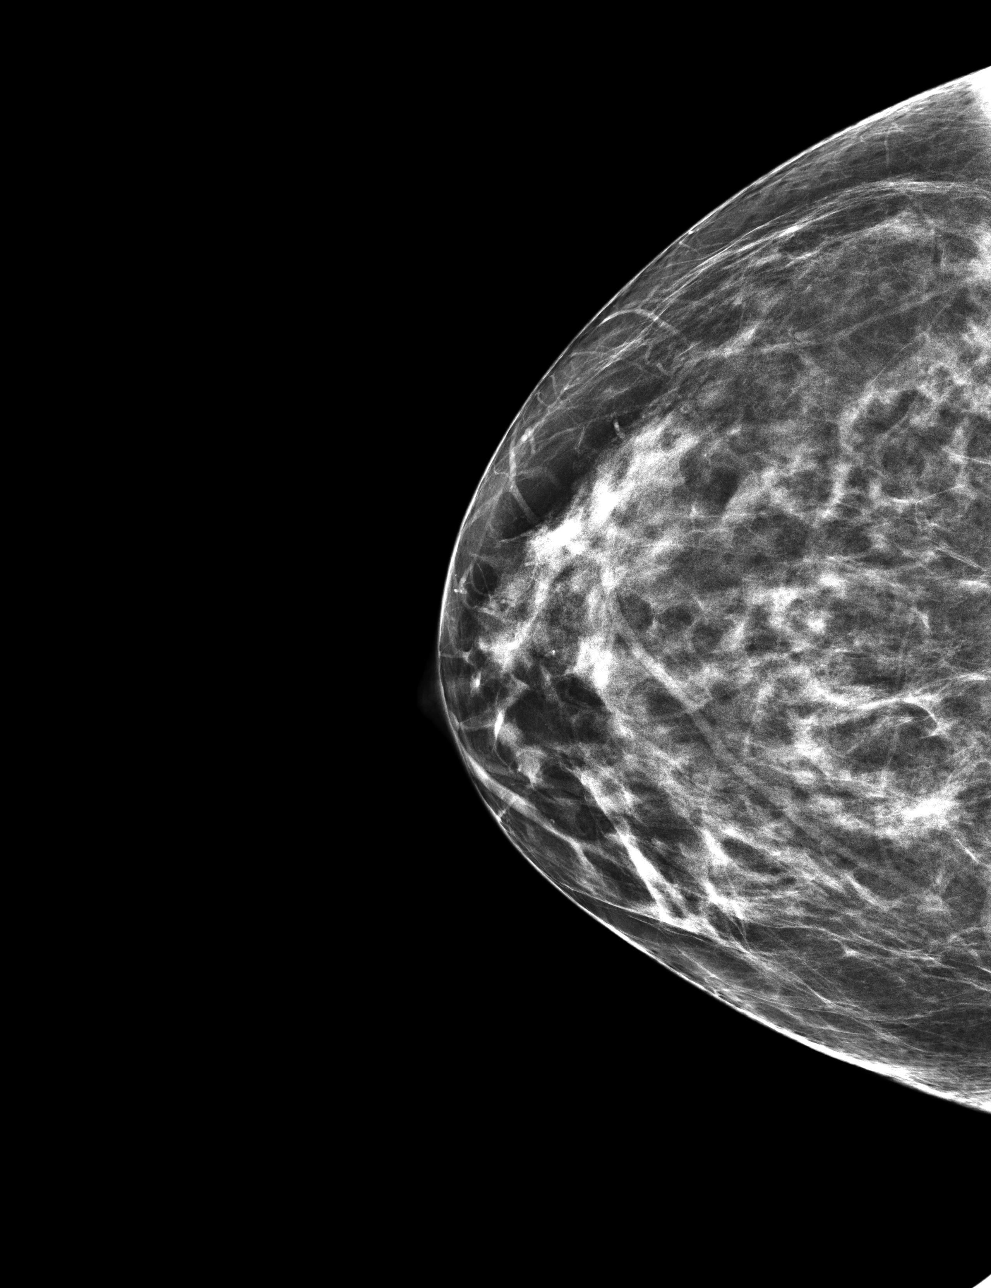

[L CC]
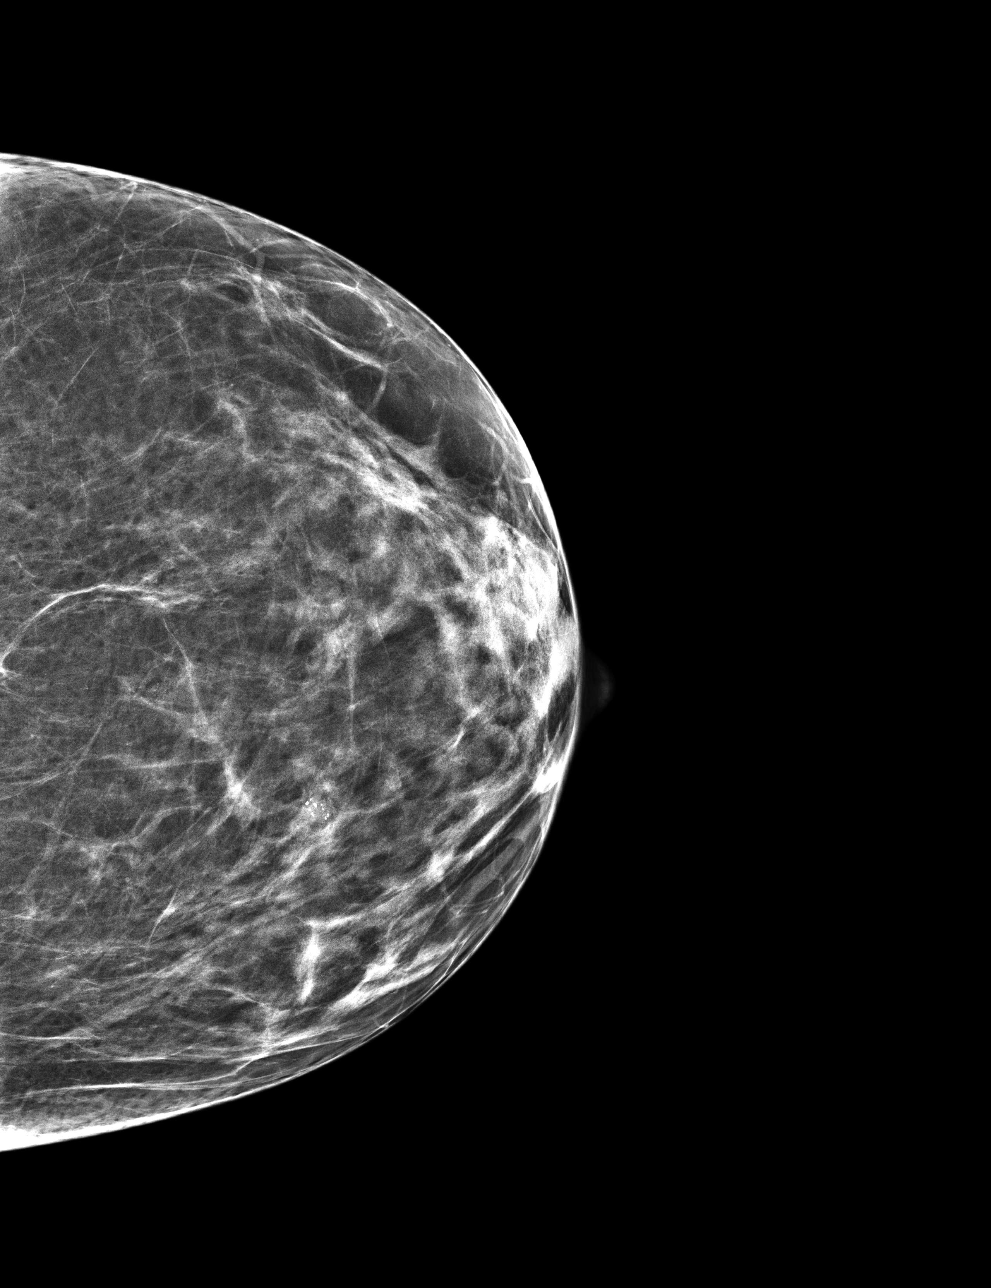

[4 of 4 positions shown; findings below may reference images not displayed]

ACR Breast Density Category c: The breast tissue is heterogeneously
dense, which may obscure small masses.
FINDINGS: There are no findings suspicious for malignancy. Images were
processed with CAD.
IMPRESSION: No mammographic evidence of malignancy. A result letter of this
screening mammogram will be mailed directly to the patient.

RECOMMENDATION:
Screening mammogram in one year. (Code:YJ-[id number])

BI-RADS CATEGORY  1: Negative.

## 2017-01-07 ENCOUNTER — Ambulatory Visit (HOSPITAL_COMMUNITY): Payer: Self-pay | Admitting: Licensed Clinical Social Worker

## 2017-01-15 ENCOUNTER — Ambulatory Visit (INDEPENDENT_AMBULATORY_CARE_PROVIDER_SITE_OTHER): Payer: BLUE CROSS/BLUE SHIELD

## 2017-01-15 ENCOUNTER — Encounter: Payer: Self-pay | Admitting: Family Medicine

## 2017-01-15 ENCOUNTER — Ambulatory Visit (INDEPENDENT_AMBULATORY_CARE_PROVIDER_SITE_OTHER): Payer: BLUE CROSS/BLUE SHIELD | Admitting: Licensed Clinical Social Worker

## 2017-01-15 ENCOUNTER — Ambulatory Visit (INDEPENDENT_AMBULATORY_CARE_PROVIDER_SITE_OTHER): Payer: BLUE CROSS/BLUE SHIELD | Admitting: Family Medicine

## 2017-01-15 VITALS — BP 132/77 | HR 93 | Wt 132.0 lb

## 2017-01-15 DIAGNOSIS — M62838 Other muscle spasm: Secondary | ICD-10-CM | POA: Diagnosis not present

## 2017-01-15 DIAGNOSIS — F331 Major depressive disorder, recurrent, moderate: Secondary | ICD-10-CM | POA: Diagnosis not present

## 2017-01-15 DIAGNOSIS — Z9149 Other personal history of psychological trauma, not elsewhere classified: Secondary | ICD-10-CM | POA: Diagnosis not present

## 2017-01-15 DIAGNOSIS — F5102 Adjustment insomnia: Secondary | ICD-10-CM

## 2017-01-15 DIAGNOSIS — M7061 Trochanteric bursitis, right hip: Secondary | ICD-10-CM

## 2017-01-15 DIAGNOSIS — M25551 Pain in right hip: Secondary | ICD-10-CM

## 2017-01-15 DIAGNOSIS — F411 Generalized anxiety disorder: Secondary | ICD-10-CM | POA: Diagnosis not present

## 2017-01-15 DIAGNOSIS — Z96642 Presence of left artificial hip joint: Secondary | ICD-10-CM | POA: Diagnosis not present

## 2017-01-15 IMAGING — DX DG PELVIS 1-2V
2 series · 2 of 2 positions shown · non-contrast
Comparison: None.

CLINICAL DATA: Right hip pain for 8 months, history of left total
hip replacement previously

EXAM:
PELVIS - 1-2 VIEW

[pelvis ap (1 of 2)]
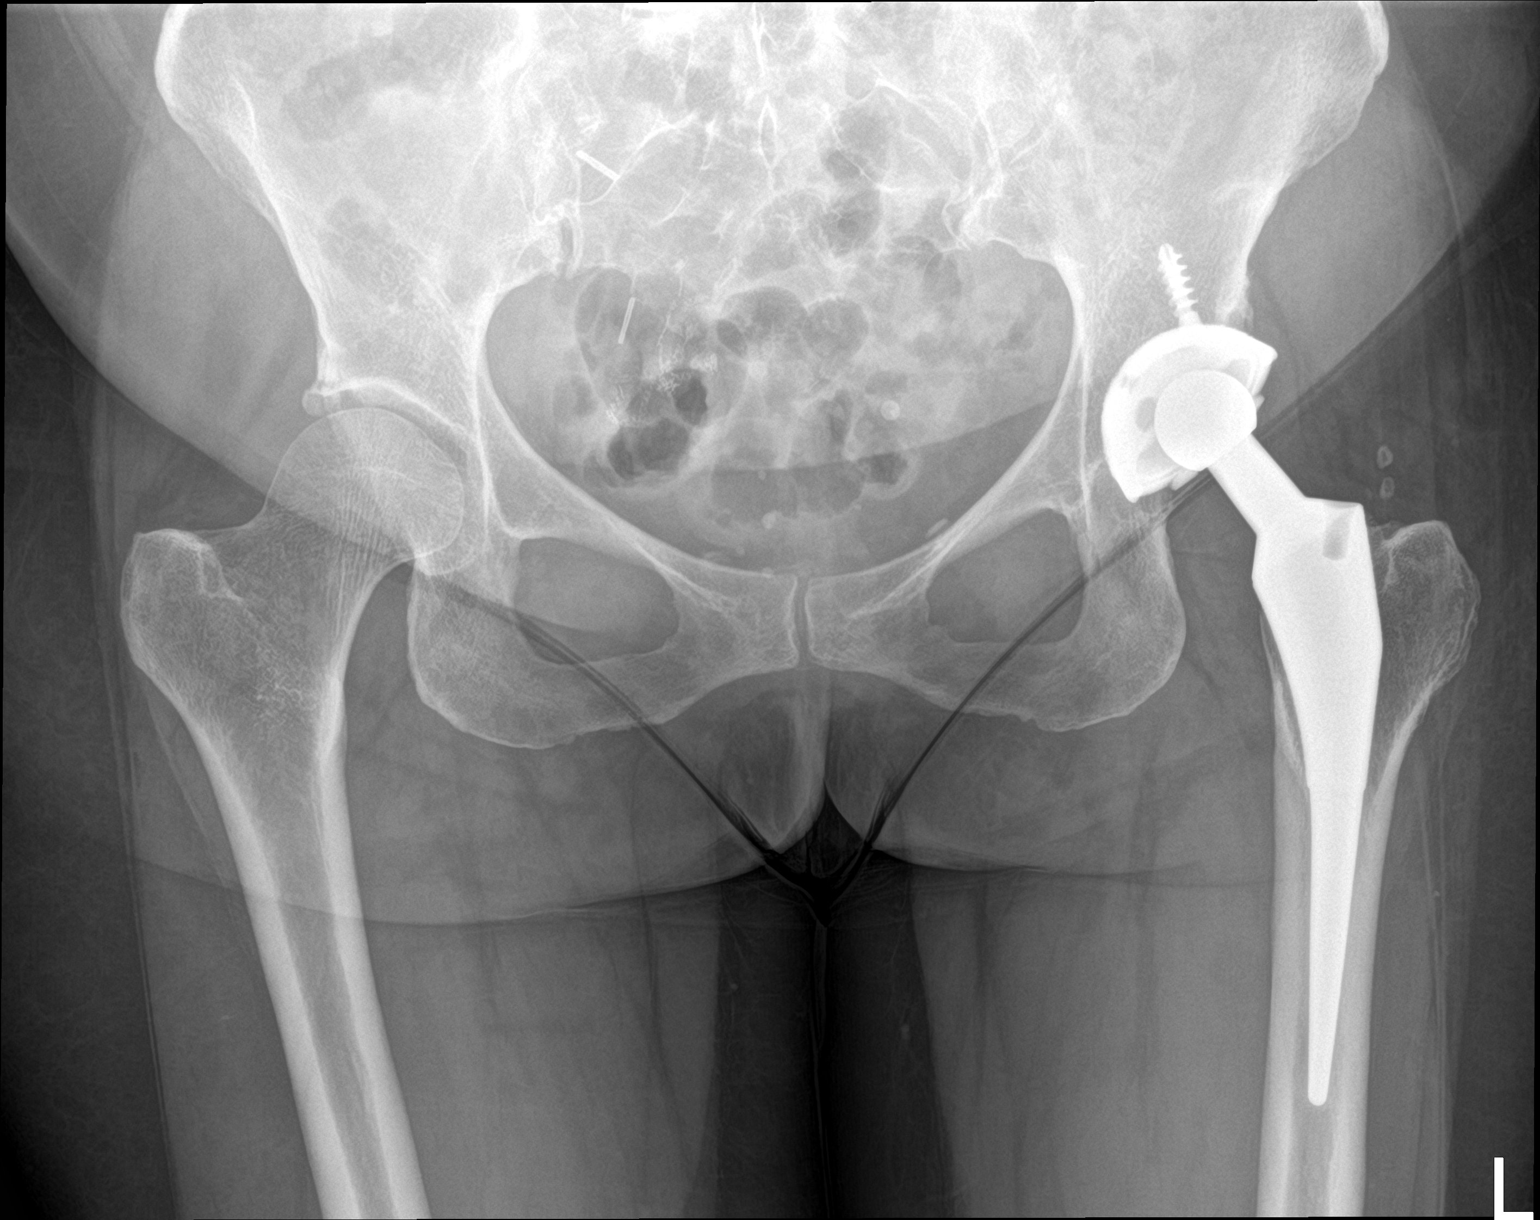

[pelvis ap (2 of 2)]
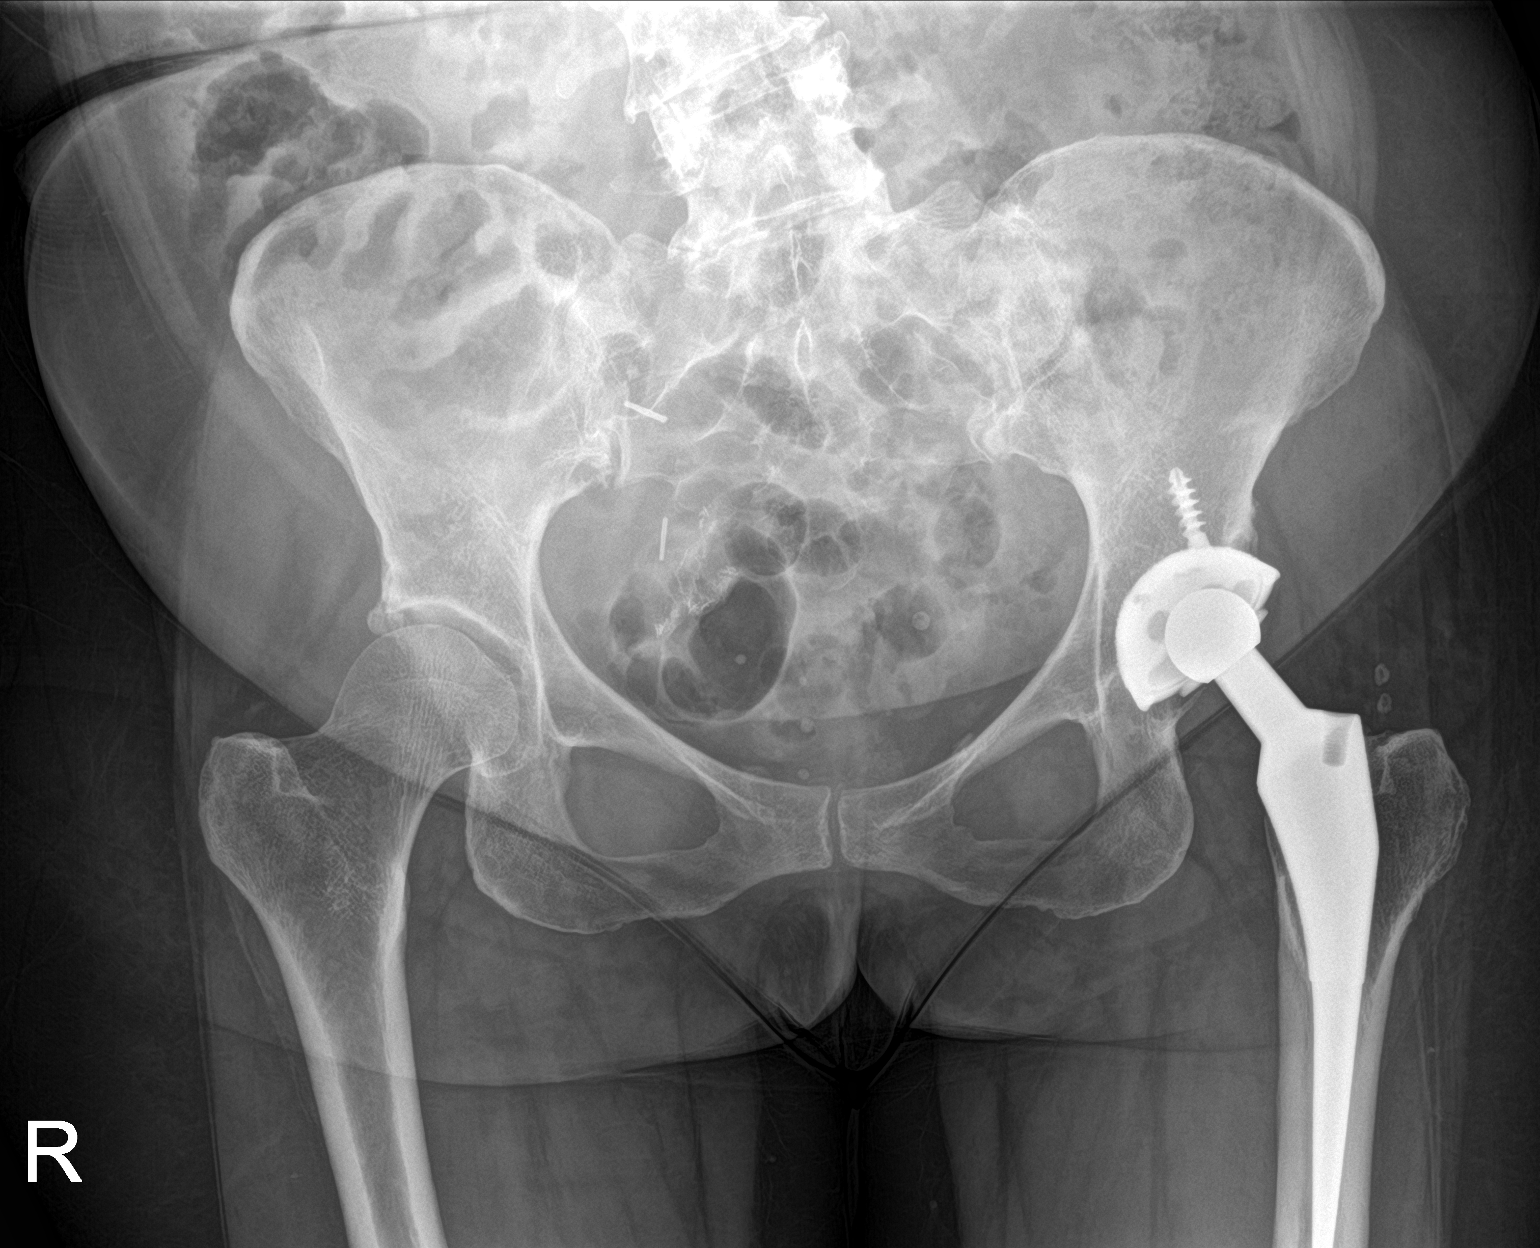

[2 of 2 positions shown; findings below may reference images not displayed]

FINDINGS: The acetabular and femoral components of the left hip replacement
appear to be in good position. There is only mild degenerative joint
disease of the right hip. No acute abnormality is seen. The pelvic
rami are intact. The SI joints appear corticated. There is some
degenerative change noted at the L5-S1 level with apparent lumbar
scoliosis.
IMPRESSION: 1. No acute abnormality of the right hip with only mild degenerative
joint disease present.
2. Stable appearance of left total hip replacement.
3. Degenerative change in the lower lumbar spine with suspected
lumbar scoliosis.

## 2017-01-15 NOTE — Progress Notes (Signed)
Leslie Roberson is a 61 y.o. female who presents to [facility] today for follow-up hip pain and discuss new trapezius pain.  Right lateral hip pain: Leslie Roberson has been seen several times for hip and back pain related to trochanteric bursitis and her scoliosis. She was last seen September 24 for right lateral hip pain. She has been doing her home physical therapy exercises but notes that the pain has not improved much. She denies any radiating pain weakness or numbness. She has a history of left hip replacement and she is worried she may have right hip arthritis.  Additionally patient notes pain in her trapezius bilaterally. This is worse with activity and holding her granddaughter and better with rest. She denies any radiating pain weakness or numbness fevers or chills.   Past Medical History:  Diagnosis Date  . Anxiety   . Asthma, chronic 11/27/2015  . Depression   . Diverticulitis of colon 12/21/2015   Colonoscopy every 5 years/digestive health.   . GERD (gastroesophageal reflux disease) 11/27/2015  . Idiopathic scoliosis 01/22/2016  . Osteoporosis 11/27/2015   On prolia.   . Thyroid disease    Past Surgical History:  Procedure Laterality Date  . ABDOMINAL HYSTERECTOMY    . bladder mesh    . ELBOW SURGERY Right   . SPINAL FUSION    . TONSILLECTOMY    . TOTAL HIP ARTHROPLASTY     Social History  Substance Use Topics  . Smoking status: Never Smoker  . Smokeless tobacco: Never Used  . Alcohol use Yes     ROS:  As above   Medications: Current Outpatient Prescriptions  Medication Sig Dispense Refill  . albuterol (PROVENTIL HFA;VENTOLIN HFA) 108 (90 Base) MCG/ACT inhaler Inhale 1-2 puffs into the lungs every 4 (four) hours as needed for wheezing or shortness of breath. 1 Inhaler 3  . busPIRone (BUSPAR) 30 MG tablet TAKE 1 TABLET (30 MG TOTAL) BY MOUTH 2 (TWO) TIMES DAILY. 60 tablet 2  . clonazePAM (KLONOPIN) 2 MG tablet Take  1.5 tablets (3 mg total) by mouth daily. 45 tablet 5  . denosumab (PROLIA) 60 MG/ML SOLN injection Inject 60 mg into the skin every 6 (six) months. Administer in upper arm, thigh, or abdomen    . desvenlafaxine (PRISTIQ) 100 MG 24 hr tablet TAKE ONE TABLET BY MOUTH DAILY 30 tablet 4  . desvenlafaxine (PRISTIQ) 50 MG 24 hr tablet TAKE ONE TABLET BY MOUTH DAILY 90 tablet 1  . dexlansoprazole (DEXILANT) 60 MG capsule TAKE 1 CAPSULE (60 MG) BY MOUTH DAILY 90 capsule 0  . diclofenac sodium (VOLTAREN) 1 % GEL APPLY 4G TOPICALLY 4 TIMES DAILY 100 g 12  . flunisolide (NASAREL) 29 MCG/ACT (0.025%) nasal spray Place 2 sprays into the nose daily as needed for rhinitis. Dose is for each nostril.    Marland Kitchen gabapentin (NEURONTIN) 300 MG capsule TAKE 1 CAP AT BEDTIME X1 WEEK,1 CAP TWICE DAILY X1 WEEK THEN 1 CAP 3X DAILY MAY DOUBLE WEEKLY=3600MG  180 capsule 3  . ipratropium (ATROVENT) 0.06 % nasal spray Place 2 sprays into both nostrils 4 (four) times daily. 15 mL 1  . levothyroxine (SYNTHROID, LEVOTHROID) 125 MCG tablet TAKE ONE-HALF TABLET BY MOUTH ONCE DAILY 30 tablet 2  . lidocaine (LIDODERM) 5 % Place 1 patch onto the skin every 12 (twelve) hours. Remove & Discard patch within 12 hours or as directed by MD 30 patch 12  . naproxen sodium (ANAPROX) 220 MG tablet Take 220 mg by  mouth as needed.    . Omega-3 Fatty Acids (FISH OIL PO) Take 1 tablet by mouth daily.    . traZODone (DESYREL) 50 MG tablet TAKE 1 TABLET (50 MG TOTAL) BY MOUTH AT BEDTIME. 90 tablet 1  . triamcinolone ointment (KENALOG) 0.1 % Apply 1 application topically 2 (two) times daily. To affected area(s) as needed. Use no longer than 1-2 weeks. 30 g 0   No current facility-administered medications for this visit.    Allergies  Allergen Reactions  . Levofloxacin Other (See Comments)    hallucinations  . Adhesive [Tape]   . Demerol [Meperidine] Nausea And Vomiting and Other (See Comments)    Doesn't work well for my pain.  . Dilaudid  [Hydromorphone] Rash     Exam:  BP 132/77   Pulse 93   Wt 132 lb (59.9 kg)   BMI 27.59 kg/m  General: Well Developed, well nourished, and in no acute distress.  Neuro/Psych: Alert and oriented x3, extra-ocular muscles intact, able to move all 4 extremities, sensation grossly intact. Skin: Warm and dry, no rashes noted.  Respiratory: Not using accessory muscles, speaking in full sentences, trachea midline.  Cardiovascular: Pulses palpable, no extremity edema. Abdomen: Does not appear distended. MSK:  Right hip normal-appearing normal motion. Tender palpation greater trochanter. Hip abduction strength 4+/5. Leg length discrepancy present. Mild antalgic gait present.      No results found for this or any previous visit (from the past 48 hour(s)). Dg Pelvis 1-2 Views  Result Date: 01/15/2017 CLINICAL DATA:  Right hip pain for 8 months, history of left total hip replacement previously EXAM: PELVIS - 1-2 VIEW COMPARISON:  None. FINDINGS: The acetabular and femoral components of the left hip replacement appear to be in good position. There is only mild degenerative joint disease of the right hip. No acute abnormality is seen. The pelvic rami are intact. The SI joints appear corticated. There is some degenerative change noted at the L5-S1 level with apparent lumbar scoliosis. IMPRESSION: 1. No acute abnormality of the right hip with only mild degenerative joint disease present. 2. Stable appearance of left total hip replacement. 3. Degenerative change in the lower lumbar spine with suspected lumbar scoliosis. Electronically Signed   By: Ivar Drape M.D.   On: 01/15/2017 10:31      Assessment and Plan: 61 y.o. female with right lateral greater trochanter bursitis likely. We discussed options. At this point plan for referral for formal physical therapy. If not improving will consider MRI versus injection. X-ray today did not show arthritis.  Trapezius pain is likely spasm and overuse.  Physical therapy will also help for this.  Recheck in 6 weeks.    Orders Placed This Encounter  Procedures  . DG Pelvis 1-2 Views    Standing Status:   Future    Number of Occurrences:   1    Standing Expiration Date:   03/17/2018    Order Specific Question:   Reason for Exam (SYMPTOM  OR DIAGNOSIS REQUIRED)    Answer:   eval right hip pain ? djd with troch bursitis    Order Specific Question:   Preferred imaging location?    Answer:   [facility]    Order Specific Question:   Radiology Contrast Protocol - do NOT remove file path    Answer:   [email or web address]  . Ambulatory referral to Physical Therapy    Referral Priority:   Routine    Referral Type:   Physical Medicine  Referral Reason:   Specialty Services Required    Requested Specialty:   Physical Therapy   No orders of the defined types were placed in this encounter.   Discussed warning signs or symptoms. Please see discharge instructions. Patient expresses understanding.  I spent 25 minutes with this patient, greater than 50% was face-to-face time counseling regarding ddx and treatment plan.
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
## 2017-01-15 NOTE — Patient Instructions (Signed)
Thank you for coming in today. Get xray today.  Attend PT.  If not better we will consider MRI next.  We may consider targeted injection based on MRI results and PT feedback.   Recheck with me in 6 weeks or sooner if needed.

## 2017-01-21 ENCOUNTER — Ambulatory Visit (INDEPENDENT_AMBULATORY_CARE_PROVIDER_SITE_OTHER): Payer: BLUE CROSS/BLUE SHIELD | Admitting: Rehabilitative and Restorative Service Providers"

## 2017-01-21 DIAGNOSIS — M25551 Pain in right hip: Secondary | ICD-10-CM | POA: Diagnosis not present

## 2017-01-21 DIAGNOSIS — R531 Weakness: Secondary | ICD-10-CM

## 2017-01-21 DIAGNOSIS — R29898 Other symptoms and signs involving the musculoskeletal system: Secondary | ICD-10-CM

## 2017-01-21 DIAGNOSIS — M25552 Pain in left hip: Secondary | ICD-10-CM

## 2017-01-21 NOTE — Patient Instructions (Addendum)
Trigger Point Dry Needling  . What is Trigger Point Dry Needling (DN)? o DN is a physical therapy technique used to treat muscle pain and dysfunction. Specifically, DN helps deactivate muscle trigger points (muscle knots).  o A thin filiform needle is used to penetrate the skin and stimulate the underlying trigger point. The goal is for a local twitch response (LTR) to occur and for the trigger point to relax. No medication of any kind is injected during the procedure.   . What Does Trigger Point Dry Needling Feel Like?  o The procedure feels different for each individual patient. Some patients report that they do not actually feel the needle enter the skin and overall the process is not painful. Very mild bleeding may occur. However, many patients feel a deep cramping in the muscle in which the needle was inserted. This is the local twitch response.   Marland Kitchen How Will I feel after the treatment? o Soreness is normal, and the onset of soreness may not occur for a few hours. Typically this soreness does not last longer than two days.  o Bruising is uncommon, however; ice can be used to decrease any possible bruising.  o In rare cases feeling tired or nauseous after the treatment is normal. In addition, your symptoms may get worse before they get better, this period will typically not last longer than 24 hours.   . What Can I do After My Treatment? o Increase your hydration by drinking more water for the next 24 hours. o You may place ice or heat on the areas treated that have become sore, however, do not use heat on inflamed or bruised areas. Heat often brings more relief post needling. o You can continue your regular activities, but vigorous activity is not recommended initially after the treatment for 24 hours. o DN is best combined with other physical therapy such as strengthening, stretching, and other therapies.    Piriformis Stretch   Lying on back, pull right knee toward opposite shoulder.  Hold 30 seconds. Repeat 3 times. Do 2-3 sessions per day.   Strengthening: Hip Abductor - Resisted    With band looped around both legs above knees, push one knee out while holding opposite leg still. Repeat __10__ times per set. Do __1-2__ sets per session. Do __1-2__ sessions per day. Repeat with other leg

## 2017-01-21 NOTE — Therapy (Addendum)
[facility] [place], [place], [address] Phone: [phone]   Fax:  [phone]  Physical Therapy Evaluation  Patient Details  Name: Leslie Roberson MRN: [id number] Date of Birth: 06/16/1955 Referring Provider: Dr Lynne Leader   Encounter Date: 01/21/2017    Past Medical History:  Diagnosis Date  . Anxiety   . Asthma, chronic 11/27/2015  . Depression   . Diverticulitis of colon 12/21/2015   Colonoscopy every 5 years/digestive health.   . GERD (gastroesophageal reflux disease) 11/27/2015  . Idiopathic scoliosis 01/22/2016  . Osteoporosis 11/27/2015   On prolia.   . Thyroid disease     Past Surgical History:  Procedure Laterality Date  . ABDOMINAL HYSTERECTOMY    . bladder mesh    . ELBOW SURGERY Right   . SPINAL FUSION    . TONSILLECTOMY    . TOTAL HIP ARTHROPLASTY      There were no vitals filed for this visit.       Subjective Assessment - 01/21/17 1020    Subjective Patient reports that she has been having pain in the Rt hip and pelvic region for the past 2 months. She has some pain in the Lt hip as well. She does not know of any accident or injury.    Pertinent History Torn meniscus Rt knee; trapezius strain; scoliosis; Lt THA several years ago   How long can you sit comfortably? no problem    How long can you stand comfortably? back pain in 5 min    How long can you walk comfortably? Lt hip pain in ~ 3-5 min    Diagnostic tests xrays    Patient Stated Goals improve pain in the Rt hip/back so she can lie on the Rt side to sleep    Currently in Pain? Yes   Pain Score 9   lying on Rt side 10/10 - none in sitting    Pain Location Hip   Pain Orientation Right  hip - pelvic area    Pain Descriptors / Indicators Aching  deep ache   Pain Type Acute pain   Pain Radiating Towards to lateral thigh    Pain Onset More than a month ago   Pain Frequency Intermittent   Aggravating Factors  lying on  the Rt side   Pain Relieving Factors staying off Rt side; topical medication             [facility] PT Assessment - 01/21/17 0001      Assessment   Medical Diagnosis Rt trochanteric bursitis; trapezius strain    Referring Provider Dr Lynne Leader    Onset Date/Surgical Date 11/12/16  off and on for ~8 months    Hand Dominance Right   Next MD Visit 11/18   Prior Therapy yes - here for hip/knees/shoulder      Precautions   Precautions None     Balance Screen   Has the patient fallen in the past 6 months Yes   How many times? 1   Has the patient had a decrease in activity level because of a fear of falling?  No   Is the patient reluctant to leave their home because of a fear of falling?  No     Home Environment   Living Environment Private residence   Living Arrangements Spouse/significant other   Home Access Level entry   [place] None     Prior Function   Level of  Independence Independent   Vocation Retired   [employer] household chores    Leisure exercise from MD      Observation/Other Assessments   Focus on Therapeutic Outcomes (FOTO)  52% limitation      Sensation   Additional Comments WFL's per pt report      Posture/Postural Control   Posture Comments scoliosis Rt convex thoracic curve; Lt concave; Rt convex lumbar curve with rotation of trunk      AROM   Right/Left Hip --  tight end range rotation      Strength   Right Hip Flexion 5/5   Right Hip Extension 4/5   Right Hip ABduction 4/5   Left Hip Flexion 5/5   Left Hip Extension 4+/5   Left Hip ABduction 4+/5     Flexibility   Hamstrings 85-90 deg bilat    Quadriceps tight bilat    ITB WFL's bilat    Piriformis tight Rt > Lt      Palpation   Palpation comment tight Rt sacral border to posterior greater trochanter; Lt posterior greater trochanter             Objective measurements completed on examination: See above findings.          [place]  Adult PT Treatment/Exercise - 01/21/17 0001      Knee/Hip Exercises: Stretches   Piriformis Stretch Right;Left;2 reps;30 seconds     Knee/Hip Exercises: Supine   Other Supine Knee/Hip Exercises clams holding one LE still moving other green TB x 10 reps 2 sets      Moist Heat Therapy   Number Minutes Moist Heat 20 Minutes   Moist Heat Location Lumbar Spine;Hip  bilat hips      Electrical Stimulation   Electrical Stimulation Location bilat hips to sacrum    Electrical Stimulation Action IFC   Electrical Stimulation Parameters to tolerance   Electrical Stimulation Goals Pain;Tone     Manual Therapy   Manual therapy comments pt prone    Soft tissue mobilization deep tissue work bilat posterior hips sacrum to posterior greater trochanter Rt > Lt    Myofascial Release Rt posterior hip           Trigger Point Dry Needling - 01/21/17 1137    Consent Given? Yes   Education Handout Provided Yes   Muscles Treated Lower Body Piriformis;Gluteus maximus;Gluteus minimus   Gluteus Maximus Response Palpable increased muscle length   Gluteus Minimus Response Palpable increased muscle length   Piriformis Response Palpable increased muscle length              PT Education - 01/21/17 1100    Education provided Yes   Education Details HEP DN    Person(s) Educated Patient   Methods Explanation;Demonstration;Tactile cues;Verbal cues;Handout   Comprehension Verbalized understanding;Returned demonstration;Verbal cues required;Tactile cues required             PT Long Term Goals - 01/21/17 1006      PT LONG TERM GOAL #1   Title independent with HEP (03/05/17)   Time 6   Period Weeks   Status New     PT LONG TERM GOAL #2   Title improve L hip abduction strength to 4+/5 for improved activity tolerance (03/05/17)   Time 6   Period Weeks   Status New     PT LONG TERM GOAL #3   Title report ability to walk > 15 min without increase in pain for improved activity tolerance and  decreased pain (  03/05/17)   Time 6   Period Weeks   Status New     PT LONG TERM GOAL #4   Title improve FOTO to </= 41% limited (03/05/17)   Time 6   Period Weeks   Status New     PT LONG TERM GOAL #5   Title decrease pain in Rt hip by 50-75% allowing patient to sleep on Rt side for 3-4 hours at night (03/05/17)   Time 6   Period Weeks   Status New      Patient seen for initiat visit POC: 6 weeks  2X/wk  Total - 12 visits  Date 03/04/17  Start; 10:15 Finish: 11:09          Plan - 01/21/17 1102    Clinical Impression Statement Leslie Roberson presents to clinic with Rt hip and posterior pelvic/back pain. She has Lt hip pain as well. Patient reports greatest pain is with lying on her Rt side to sleep. (She can only sleep on the Rt side due to other orthopedic problems.) Leslie Roberson has abnormal posture and alignment; limited mobility; muscular tightness and pain to palpation; weakness; inability to lie on Rt side to sleep. She will benefit from PT to address problems identified.    Clinical Presentation due to: chronic myofacial pain related to scoliosis and muscular tightness/limited mobility    Clinical Decision Making Low   Rehab Potential Good   PT Frequency 2x / week   PT Duration 6 weeks   PT Treatment/Interventions Patient/family education;ADLs/Self Care Home Management;Cryotherapy;Electrical Stimulation;Iontophoresis 4mg /ml Dexamethasone;Moist Heat;Ultrasound;Dry needling;Manual techniques;Therapeutic activities;Therapeutic exercise;Neuromuscular re-education   PT Next Visit Plan review HEP; progress exercise - stretch for piriformis/hip musculature; DN/manual work bilat hips; modalities as indicated    Consulted and Agree with Plan of Care Patient      Patient will benefit from skilled therapeutic intervention in order to improve the following deficits and impairments:  Postural dysfunction, Improper body mechanics, Pain, Increased fascial restricitons, Increased muscle  spasms, Decreased strength, Decreased mobility, Decreased range of motion, Decreased activity tolerance  Visit Diagnosis: Pain of right hip joint - Plan: PT plan of care cert/re-cert  Other symptoms and signs involving the musculoskeletal system - Plan: PT plan of care cert/re-cert  Weakness generalized - Plan: PT plan of care cert/re-cert  Pain of left hip joint - Plan: PT plan of care cert/re-cert     Problem List Patient Active Problem List   Diagnosis Date Noted  . No energy 09/15/2016  . Bad taste in mouth 07/26/2016  . Paresthesia 04/03/2016  . Facet hypertrophy of lumbosacral region 04/03/2016  . Chronic back pain 04/03/2016  . Right knee pain 02/19/2016  . Rosacea 02/04/2016  . Vaginal atrophy 01/31/2016  . Idiopathic scoliosis 01/22/2016  . Hip bursitis 12/25/2015  . Biceps tendonitis on right 12/25/2015  . Diverticulitis of colon 12/21/2015  . GERD (gastroesophageal reflux disease) 11/27/2015  . Osteoporosis 11/27/2015  . GAD (generalized anxiety disorder) 11/27/2015  . MDD (major depressive disorder), recurrent, in full remission ([place]) 11/27/2015  . Chronic dryness of both eyes 11/27/2015  . Hypothyroidism 11/27/2015  . Asthma, chronic 11/27/2015  . Insomnia 11/27/2015  . Seborrheic keratoses 11/27/2015    Akeylah Hendel Nilda Simmer PT, MPH  01/21/2017, 11:37 AM  [facility] [address] [place], [place], [address] Phone: [phone]   Fax:  [phone]  Name: Leslie Roberson MRN: [id number] Date of Birth: 09-Mar-1956

## 2017-01-26 ENCOUNTER — Ambulatory Visit (INDEPENDENT_AMBULATORY_CARE_PROVIDER_SITE_OTHER): Payer: BLUE CROSS/BLUE SHIELD | Admitting: Physical Therapy

## 2017-01-26 ENCOUNTER — Ambulatory Visit (INDEPENDENT_AMBULATORY_CARE_PROVIDER_SITE_OTHER): Payer: BLUE CROSS/BLUE SHIELD | Admitting: Physician Assistant

## 2017-01-26 VITALS — BP 117/69 | HR 81 | Ht <= 58 in | Wt 129.0 lb

## 2017-01-26 DIAGNOSIS — R531 Weakness: Secondary | ICD-10-CM

## 2017-01-26 DIAGNOSIS — R221 Localized swelling, mass and lump, neck: Secondary | ICD-10-CM

## 2017-01-26 DIAGNOSIS — Z131 Encounter for screening for diabetes mellitus: Secondary | ICD-10-CM

## 2017-01-26 DIAGNOSIS — R1310 Dysphagia, unspecified: Secondary | ICD-10-CM

## 2017-01-26 DIAGNOSIS — E039 Hypothyroidism, unspecified: Secondary | ICD-10-CM | POA: Diagnosis not present

## 2017-01-26 DIAGNOSIS — M25551 Pain in right hip: Secondary | ICD-10-CM

## 2017-01-26 DIAGNOSIS — R29898 Other symptoms and signs involving the musculoskeletal system: Secondary | ICD-10-CM

## 2017-01-26 DIAGNOSIS — Z13220 Encounter for screening for lipoid disorders: Secondary | ICD-10-CM

## 2017-01-26 NOTE — Patient Instructions (Signed)
Piriformis (Supine)    Cross legs, right on top. Gently pull other knee toward chest until stretch is felt in buttock/hip of top leg. Hold __30__ seconds. Repeat _2___ times per set. Do _1___ sets per session. Do __2__ sessions per day.   [facility] [place], [place] [address]  [phone] (office) [phone] (fax)

## 2017-01-26 NOTE — Therapy (Signed)
[place] [place] [facility], [place], [address] Phone: [phone]   Fax:  [phone]  Physical Therapy Treatment  Patient Details  Name: Leslie Roberson MRN: [id number] Date of Birth: 06-08-1955 Referring Provider: Dr. Georgina Snell   Encounter Date: 01/26/2017      PT End of Session - 01/26/17 1150    Visit Number 2   Number of Visits 12   Date for PT Re-Evaluation 03/04/17   PT Start Time 6962   PT Stop Time 1242   PT Time Calculation (min) 53 min   Activity Tolerance Patient tolerated treatment well   Behavior During Therapy [facility] for tasks assessed/performed      Past Medical History:  Diagnosis Date  . Anxiety   . Asthma, chronic 11/27/2015  . Depression   . Diverticulitis of colon 12/21/2015   Colonoscopy every 5 years/digestive health.   . GERD (gastroesophageal reflux disease) 11/27/2015  . Idiopathic scoliosis 01/22/2016  . Osteoporosis 11/27/2015   On prolia.   . Thyroid disease     Past Surgical History:  Procedure Laterality Date  . ABDOMINAL HYSTERECTOMY    . bladder mesh    . ELBOW SURGERY Right   . SPINAL FUSION    . TONSILLECTOMY    . TOTAL HIP ARTHROPLASTY      There were no vitals filed for this visit.      Subjective Assessment - 01/26/17 1150    Subjective "That needling really did me in". Pt reports she had some soreness from Dry needling.  Minimal changes since last visit. She didn't complete any exercise since then as she was afraid it would make hip more sore.    Patient Stated Goals improve pain in the Rt hip/back so she can lie on the Rt side to sleep    Currently in Pain? No/denies   Pain Score 0-No pain  up to 9/10 with walking   Pain Location Hip   Pain Orientation Right;Lateral   Aggravating Factors  lying on Rt side    Pain Relieving Factors staying off Rt side, topical medication P            [facility] PT Assessment - 01/26/17 0001      Assessment   Medical  Diagnosis Rt trochanteric bursitis; trapezius strain    Referring Provider Dr. Georgina Snell    Onset Date/Surgical Date 11/12/16   Hand Dominance Right   Next MD Visit 11/18         [facility] Adult PT Treatment/Exercise - 01/26/17 0001      Knee/Hip Exercises: Stretches   Passive Hamstring Stretch Right;Left;2 reps;30 seconds   Piriformis Stretch Right;Left;30 seconds;3 reps  strap to assist   Other Knee/Hip Stretches ITB stretch in supine with strap x 30 sec x 2 reps each leg.      Knee/Hip Exercises: Standing   Hip Abduction Stengthening;Right;Left;1 set;5 reps;Knee straight  isometric hold, leg against door frame - 5 sec holds     Knee/Hip Exercises: Supine   Other Supine Knee/Hip Exercises bridge with glute squeeze and outward resistance into blue band around thighs x 10 reps.  supine single clam with blue band around thighs x 10 reps each leg.      Moist Heat Therapy   Number Minutes Moist Heat 15 Minutes   Moist Heat Location Hip  Rt; applied in supine     Electrical Stimulation   Electrical Stimulation Location Rt lateral hip    Electrical Stimulation Action IFC  Electrical Stimulation Parameters to tolerance    Electrical Stimulation Goals Pain     Manual Therapy   Manual therapy comments pt is Lt sidelying.    Myofascial Release Rt glute med/min, piriformis, TFL                 PT Education - 01/26/17 1203    Education provided Yes   Education Details HEP - added piriformis   Person(s) Educated Patient   Methods Explanation;Handout   Comprehension Verbalized understanding;Returned demonstration             PT Long Term Goals - 01/26/17 1240      PT LONG TERM GOAL #1   Title independent with HEP (03/05/17)   Time 6   Period Weeks   Status On-going     PT LONG TERM GOAL #2   Title improve L hip abduction strength to 4+/5 for improved activity tolerance (03/05/17)   Time 6   Period Weeks   Status On-going     PT LONG TERM GOAL #3   Title report  ability to walk > 15 min without increase in pain for improved activity tolerance and decreased pain (03/05/17)   Time 6   Period Weeks   Status On-going     PT LONG TERM GOAL #4   Title improve FOTO to </= 41% limited (03/05/17)   Time 6   Period Weeks   Status On-going     PT LONG TERM GOAL #5   Title decrease pain in Rt hip by 50-75% allowing patient to sleep on Rt side for 3-4 hours at night (03/05/17)   Time 6   Period Weeks   Status On-going               Plan - 01/26/17 1236    Clinical Impression Statement Pt tolerated all exercises well, reporting less tension in Rt hip with completion. Further report of reduction in Rt hip symptoms at end of session. Progressing towards goals.    Rehab Potential Good   PT Frequency 2x / week   PT Duration 6 weeks   PT Treatment/Interventions Patient/family education;ADLs/Self Care Home Management;Cryotherapy;Electrical Stimulation;Iontophoresis 4mg /ml Dexamethasone;Moist Heat;Ultrasound;Dry needling;Manual techniques;Therapeutic activities;Therapeutic exercise;Neuromuscular re-education   PT Next Visit Plan continue stretches for piriformis/hip musculature; DN/manual work bilat hips; modalities as indicated    Consulted and Agree with Plan of Care Patient      Patient will benefit from skilled therapeutic intervention in order to improve the following deficits and impairments:  Postural dysfunction, Improper body mechanics, Pain, Increased fascial restricitons, Increased muscle spasms, Decreased strength, Decreased mobility, Decreased range of motion, Decreased activity tolerance  Visit Diagnosis: Pain of right hip joint  Other symptoms and signs involving the musculoskeletal system  Weakness generalized     Problem List Patient Active Problem List   Diagnosis Date Noted  . No energy 09/15/2016  . Bad taste in mouth 07/26/2016  . Paresthesia 04/03/2016  . Facet hypertrophy of lumbosacral region 04/03/2016  . Chronic back  pain 04/03/2016  . Right knee pain 02/19/2016  . Rosacea 02/04/2016  . Vaginal atrophy 01/31/2016  . Idiopathic scoliosis 01/22/2016  . Hip bursitis 12/25/2015  . Biceps tendonitis on right 12/25/2015  . Diverticulitis of colon 12/21/2015  . GERD (gastroesophageal reflux disease) 11/27/2015  . Osteoporosis 11/27/2015  . GAD (generalized anxiety disorder) 11/27/2015  . MDD (major depressive disorder), recurrent, in full remission ([place]) 11/27/2015  . Chronic dryness of both eyes 11/27/2015  . Hypothyroidism 11/27/2015  .  Asthma, chronic 11/27/2015  . Insomnia 11/27/2015  . Seborrheic keratoses 11/27/2015   Kerin Perna, PTA 01/26/17 12:52 PM  [place] [facility] [place], [place], [address] Phone: [phone]   Fax:  [phone]  Name: Leslie Roberson MRN: [id number] Date of Birth: 12-13-1955

## 2017-01-26 NOTE — Patient Instructions (Signed)

## 2017-01-26 NOTE — Progress Notes (Signed)
Subjective:    Patient ID: Leslie Roberson, female    DOB: [birth year], 61 y.o.   MRN: [id number]  HPI  Patient is a 61 year old pleasant female who presents to the clinic with 3 weeks of problems swallowing.  She has never had this happen before.  Patient has had GERD for the last 40+ years.  She is currently fairly well controlled on Dexilant.  She also does have hypothyroidism that was last check in may 2018 and 1.93 and controlled. She denies any fever, chills, SOB. She has not lost her voice. She denies any choking. She more just "feels like there is a pea stuck in her throat". Nothing seems to make better. Not started any new medications.   .. Active Ambulatory Problems    Diagnosis Date Noted  . GERD (gastroesophageal reflux disease) 11/27/2015  . Osteoporosis 11/27/2015  . GAD (generalized anxiety disorder) 11/27/2015  . MDD (major depressive disorder), recurrent, in full remission ([place]) 11/27/2015  . Chronic dryness of both eyes 11/27/2015  . Hypothyroidism 11/27/2015  . Asthma, chronic 11/27/2015  . Insomnia 11/27/2015  . Seborrheic keratoses 11/27/2015  . Diverticulitis of colon 12/21/2015  . Hip bursitis 12/25/2015  . Biceps tendonitis on right 12/25/2015  . Idiopathic scoliosis 01/22/2016  . Vaginal atrophy 01/31/2016  . Rosacea 02/04/2016  . Right knee pain 02/19/2016  . Paresthesia 04/03/2016  . Facet hypertrophy of lumbosacral region 04/03/2016  . Chronic back pain 04/03/2016  . Bad taste in mouth 07/26/2016  . No energy 09/15/2016  . Lump in throat 01/27/2017  . Dysphagia 01/27/2017   Resolved Ambulatory Problems    Diagnosis Date Noted  . Acute medial meniscus tear 04/23/2016  . Left knee pain 04/28/2016  . Mild intermittent asthma without complication 48/54/6270   Past Medical History:  Diagnosis Date  . Anxiety   . Asthma, chronic 11/27/2015  . Depression   . Diverticulitis of colon 12/21/2015  . GERD (gastroesophageal reflux disease) 11/27/2015  .  Idiopathic scoliosis 01/22/2016  . Osteoporosis 11/27/2015  . Thyroid disease        Review of Systems  All other systems reviewed and are negative.      Objective:   Physical Exam  Constitutional: She is oriented to person, place, and time. She appears well-developed and well-nourished.  HENT:  Head: Normocephalic and atraumatic.  Neck: Normal range of motion. Neck supple. No thyromegaly present.  Cardiovascular: Normal rate, regular rhythm and normal heart sounds.   Pulmonary/Chest: Effort normal and breath sounds normal.  Abdominal: Soft. Bowel sounds are normal. She exhibits no distension and no mass. There is no tenderness. There is no rebound and no guarding.  Lymphadenopathy:    She has no cervical adenopathy.  Neurological: She is alert and oriented to person, place, and time.  Psychiatric: She has a normal mood and affect. Her behavior is normal.          Assessment & Plan:  Marland KitchenMarland KitchenDiagnoses and all orders for this visit:  Dysphagia, unspecified type -     Ambulatory referral to Gastroenterology  Screening for diabetes mellitus -     COMPLETE METABOLIC PANEL WITH GFR  Screening for lipid disorders -     Lipid Panel w/reflex Direct LDL  Acquired hypothyroidism -     TSH  Lump in throat   Unclear etiology but I suspect GI related and possible esophageal stenosis vs GERD exacerbation vs esophageal stricture. I will send to GI for evaluation. Continue dexilant. She needs EGD for long  term GERD evaluation as well.  In the meantime, chew food completely and drink plenty of fluids with meals.   I do not suspect thyroid. If GI exam is normal then consider thyroid u/s. Will recheck tsh since been 6 months.

## 2017-01-27 ENCOUNTER — Encounter: Payer: Self-pay | Admitting: Physician Assistant

## 2017-01-27 DIAGNOSIS — R221 Localized swelling, mass and lump, neck: Secondary | ICD-10-CM | POA: Insufficient documentation

## 2017-01-27 DIAGNOSIS — R1310 Dysphagia, unspecified: Secondary | ICD-10-CM | POA: Insufficient documentation

## 2017-01-29 ENCOUNTER — Encounter: Payer: Self-pay | Admitting: Rehabilitative and Restorative Service Providers"

## 2017-01-29 ENCOUNTER — Encounter (HOSPITAL_COMMUNITY): Payer: Self-pay | Admitting: Psychiatry

## 2017-01-29 ENCOUNTER — Ambulatory Visit (INDEPENDENT_AMBULATORY_CARE_PROVIDER_SITE_OTHER): Payer: BLUE CROSS/BLUE SHIELD | Admitting: Psychiatry

## 2017-01-29 ENCOUNTER — Ambulatory Visit (INDEPENDENT_AMBULATORY_CARE_PROVIDER_SITE_OTHER): Payer: BLUE CROSS/BLUE SHIELD | Admitting: Rehabilitative and Restorative Service Providers"

## 2017-01-29 VITALS — BP 128/80 | HR 94 | Resp 18 | Ht <= 58 in | Wt 128.0 lb

## 2017-01-29 DIAGNOSIS — M25551 Pain in right hip: Secondary | ICD-10-CM | POA: Diagnosis not present

## 2017-01-29 DIAGNOSIS — R531 Weakness: Secondary | ICD-10-CM | POA: Diagnosis not present

## 2017-01-29 DIAGNOSIS — F331 Major depressive disorder, recurrent, moderate: Secondary | ICD-10-CM

## 2017-01-29 DIAGNOSIS — Z9149 Other personal history of psychological trauma, not elsewhere classified: Secondary | ICD-10-CM | POA: Diagnosis not present

## 2017-01-29 DIAGNOSIS — F411 Generalized anxiety disorder: Secondary | ICD-10-CM | POA: Diagnosis not present

## 2017-01-29 DIAGNOSIS — R29898 Other symptoms and signs involving the musculoskeletal system: Secondary | ICD-10-CM

## 2017-01-29 DIAGNOSIS — Z62810 Personal history of physical and sexual abuse in childhood: Secondary | ICD-10-CM

## 2017-01-29 DIAGNOSIS — F5102 Adjustment insomnia: Secondary | ICD-10-CM | POA: Diagnosis not present

## 2017-01-29 DIAGNOSIS — Z818 Family history of other mental and behavioral disorders: Secondary | ICD-10-CM | POA: Diagnosis not present

## 2017-01-29 MED ORDER — ESCITALOPRAM OXALATE 5 MG PO TABS: 5.0000 mg | ORAL_TABLET | Freq: Every day | ORAL | 0 refills | Status: DC

## 2017-01-29 NOTE — Therapy (Signed)
[place], [place], [address] Phone: [phone]   Fax:  [phone]  Physical Therapy Treatment  Patient Details  Name: Leslie Roberson MRN: [id number] Date of Birth: 1955-06-01 Referring Provider: Dr. Georgina Snell   Encounter Date: 01/29/2017      PT End of Session - 01/29/17 1406    Visit Number 3   Number of Visits 12   Date for PT Re-Evaluation 03/04/17   PT Start Time 3846   PT Stop Time 1500   PT Time Calculation (min) 56 min   Activity Tolerance Patient tolerated treatment well      Past Medical History:  Diagnosis Date  . Anxiety   . Asthma, chronic 11/27/2015  . Depression   . Diverticulitis of colon 12/21/2015   Colonoscopy every 5 years/digestive health.   . GERD (gastroesophageal reflux disease) 11/27/2015  . Idiopathic scoliosis 01/22/2016  . Osteoporosis 11/27/2015   On prolia.   . Thyroid disease     Past Surgical History:  Procedure Laterality Date  . ABDOMINAL HYSTERECTOMY    . bladder mesh    . ELBOW SURGERY Right   . SPINAL FUSION    . TONSILLECTOMY    . TOTAL HIP ARTHROPLASTY      There were no vitals filed for this visit.      Subjective Assessment - 01/29/17 1406    Subjective Still having pain in the hip and difficulty sleeping. Manual work helped some. Biggest trouble is sleeping.    Currently in Pain? Yes   Pain Score 8   Lt hip pain increases with walking any distance    Pain Location Hip   Pain Orientation Right;Lateral   Pain Descriptors / Indicators Sore;Aching   Pain Type Acute pain                         [facility] Adult PT Treatment/Exercise - 01/29/17 0001      Knee/Hip Exercises: Stretches   Passive Hamstring Stretch Right;Left;2 reps;30 seconds   Piriformis Stretch Right;Left;30 seconds;3 reps  strap to assist   Other Knee/Hip Stretches ITB stretch in supine with strap x 30 sec x 2 reps each leg.      Knee/Hip Exercises:  Standing   Hip Abduction Stengthening;Right;Left;2 sets;10 reps  leading with heel   Hip Extension Stengthening;Right;Left;2 sets;10 reps     Knee/Hip Exercises: Supine   Other Supine Knee/Hip Exercises bridge with glute squeeze x10     Moist Heat Therapy   Number Minutes Moist Heat 20 Minutes   Moist Heat Location Lumbar Spine;Hip  bilat applied in supine     Electrical Stimulation   Electrical Stimulation Location bilat hips    Electrical Stimulation Action IFC   Electrical Stimulation Parameters to tolerance   Electrical Stimulation Goals Pain     Manual Therapy   Manual therapy comments pt prone    Soft tissue mobilization deep tissue work bilat posterior hips sacrum to posterior greater trochanter Rt > Lt    Myofascial Release bilat posterior hips           Trigger Point Dry Needling - 01/29/17 1436    Consent Given? Yes   Muscles Treated Lower Body --  bilat hip area bilat    Gluteus Maximus Response Palpable increased muscle length   Gluteus Minimus Response Palpable increased muscle length   Piriformis Response Palpable increased muscle length  PT Long Term Goals - 01/26/17 1240      PT LONG TERM GOAL #1   Title independent with HEP (03/05/17)   Time 6   Period Weeks   Status On-going     PT LONG TERM GOAL #2   Title improve L hip abduction strength to 4+/5 for improved activity tolerance (03/05/17)   Time 6   Period Weeks   Status On-going     PT LONG TERM GOAL #3   Title report ability to walk > 15 min without increase in pain for improved activity tolerance and decreased pain (03/05/17)   Time 6   Period Weeks   Status On-going     PT LONG TERM GOAL #4   Title improve FOTO to </= 41% limited (03/05/17)   Time 6   Period Weeks   Status On-going     PT LONG TERM GOAL #5   Title decrease pain in Rt hip by 50-75% allowing patient to sleep on Rt side for 3-4 hours at night (03/05/17)   Time 6   Period Weeks   Status  On-going               Plan - 01/29/17 1409    Clinical Impression Statement continued muscular tightness and pain Rt hip primarily - some pain in the Lt hip with prolonged walking. Difficulty tolerating much needling. Will assess response to less DN in the hip today. Added hip strengthening in standing.    Rehab Potential Good   PT Frequency 2x / week   PT Duration 6 weeks   PT Treatment/Interventions Patient/family education;ADLs/Self Care Home Management;Cryotherapy;Electrical Stimulation;Iontophoresis 4mg /ml Dexamethasone;Moist Heat;Ultrasound;Dry needling;Manual techniques;Therapeutic activities;Therapeutic exercise;Neuromuscular re-education   PT Next Visit Plan continue stretches for piriformis/hip musculature; DN/manual work bilat hips; modalities as indicated    Consulted and Agree with Plan of Care Patient      Patient will benefit from skilled therapeutic intervention in order to improve the following deficits and impairments:  Postural dysfunction, Improper body mechanics, Pain, Increased fascial restricitons, Increased muscle spasms, Decreased strength, Decreased mobility, Decreased range of motion, Decreased activity tolerance  Visit Diagnosis: Pain of right hip joint  Other symptoms and signs involving the musculoskeletal system  Weakness generalized     Problem List Patient Active Problem List   Diagnosis Date Noted  . Lump in throat 01/27/2017  . Dysphagia 01/27/2017  . No energy 09/15/2016  . Bad taste in mouth 07/26/2016  . Paresthesia 04/03/2016  . Facet hypertrophy of lumbosacral region 04/03/2016  . Chronic back pain 04/03/2016  . Right knee pain 02/19/2016  . Rosacea 02/04/2016  . Vaginal atrophy 01/31/2016  . Idiopathic scoliosis 01/22/2016  . Hip bursitis 12/25/2015  . Biceps tendonitis on right 12/25/2015  . Diverticulitis of colon 12/21/2015  . GERD (gastroesophageal reflux disease) 11/27/2015  . Osteoporosis 11/27/2015  . GAD  (generalized anxiety disorder) 11/27/2015  . MDD (major depressive disorder), recurrent, in full remission ([place]) 11/27/2015  . Chronic dryness of both eyes 11/27/2015  . Hypothyroidism 11/27/2015  . Asthma, chronic 11/27/2015  . Insomnia 11/27/2015  . Seborrheic keratoses 11/27/2015    Lorell Thibodaux Nilda Simmer PT, MPH  01/29/2017, 2:50 PM  [facility] [place], [place], [address] Phone: [phone]   Fax:  [phone]  Name: Etheleen Valtierra MRN: [id number] Date of Birth: 09-10-1955
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
## 2017-01-30 ENCOUNTER — Ambulatory Visit (INDEPENDENT_AMBULATORY_CARE_PROVIDER_SITE_OTHER): Payer: BLUE CROSS/BLUE SHIELD | Admitting: Licensed Clinical Social Worker

## 2017-01-30 DIAGNOSIS — F411 Generalized anxiety disorder: Secondary | ICD-10-CM

## 2017-01-30 DIAGNOSIS — F331 Major depressive disorder, recurrent, moderate: Secondary | ICD-10-CM

## 2017-01-30 DIAGNOSIS — F5102 Adjustment insomnia: Secondary | ICD-10-CM | POA: Diagnosis not present

## 2017-01-30 DIAGNOSIS — Z9149 Other personal history of psychological trauma, not elsewhere classified: Secondary | ICD-10-CM

## 2017-01-30 NOTE — Progress Notes (Signed)
   THERAPIST PROGRESS NOTE  Session Time: 11:04 AM to 11:57 AM  Participation Level: Active  Behavioral Response: CasualAlertBlunted, tearful at one point in describing feelings of depression  Type of Therapy: Individual Therapy  Treatment Goals addressed:  elevated mood in show evidence of usual energy, activities and socialization level; renewed typical interest in academic, achievement, social involvement, and eating patterns as well as occasional expressions of joy and zest for life, learn and apply emotional regulation skills  Interventions: CBT, Solution Focused, Strength-based, Supportive and Reframing  Summary: Leslie Roberson is a 61 y.o. female who presents with "it has been a bad week". Going through a lot of anxiety decreasing medicine then switched to full blown depression, crying. Relates that it didn't help to go to a movie that increased depression. Doctor just added the lowest dose of Lexapro yesterday. relaets that "can feel it coming on anyway". Recognizes that coming down off of Klonopin is a source, would love to get off of medicine, was doing good, a 1/4 pill, last quarter of a pill has led to increase of symptoms, notices a difference from 1/2 to a quarter, not sure if it is this decrease or has caught up with her. Shares that "didn't like the way she was feeling, still feeling that way but less so". Shared her frustration with self. There is so much going on that is positive including a grandchild so that there is no reason for depression. Recognizes negative thinking that creates a "vicious cycle" that keeps depression going. Worked on challenging negative thinking including inaccuracy and lowering expectations of self. Discussed she is affected by winter, that apartment has no light that impacts her. Discussed strategies that have been helpful for her depression and patient relates listening to medication for anxiety and depression that also helps her with sleep. Discuss  pain issues and has scoliosis. Exercise is limited but in physical therapy now. Shared that impacts sleep as she is a side sleeper but can't sleep on side now because hip hurts. Discussed how physical therapy is supposed to utilize a process of "needling" that helps unlock spasms. Reviewed session and patient related emphasis was on changing thinking. And pleaded treatment plan the patient has to work on both anxiety but treatment plan focused on depression.  Suicidal/Homicidal: No  Therapist Response: Assessed patient's current functioning per report. Identified medication change as one in the main sources of patient's depression and worked on perspective taking that adjusting to changes natural part of process and that ultimately it's worth it for her to do it to help with her coping. Identified negative thinking patterns that contribute to depression and discussed that they are self-defeating, and faulty. Discussed how being self compassion it is ultimately more helpful. Worked on cognitive distortion of "should", discussed how we think we motivate herself but ultimately and makes [place] feel worse. Discussed not being so critical oneself and replacing shoulds with "I want to". Completed treatment plan. Encouraged patient and behavioral strategies such as exercise and engagement in positive activities that will help in coping with depression. Provided supportive and strength-based intervention  Plan: Return again in 2 weeks.2. Therapist work with patient on education about diagnosis as well as learning emotional regulation strategies and coping skills to help in decrease of symptoms  Diagnosis: Axis I:  generalized anxiety disorder, major depressive disorder, recurrent, moderate, adjustment insomnia psychological trauma history    Axis II: No diagnosis    Pranathi Winfree, LCSW 01/30/2017

## 2017-02-02 ENCOUNTER — Encounter: Payer: Self-pay | Admitting: Rehabilitative and Restorative Service Providers"

## 2017-02-02 ENCOUNTER — Ambulatory Visit: Payer: BLUE CROSS/BLUE SHIELD | Admitting: Rehabilitative and Restorative Service Providers"

## 2017-02-02 DIAGNOSIS — R29898 Other symptoms and signs involving the musculoskeletal system: Secondary | ICD-10-CM

## 2017-02-02 DIAGNOSIS — R531 Weakness: Secondary | ICD-10-CM

## 2017-02-02 DIAGNOSIS — M25551 Pain in right hip: Secondary | ICD-10-CM | POA: Diagnosis not present

## 2017-02-02 DIAGNOSIS — M25552 Pain in left hip: Secondary | ICD-10-CM | POA: Diagnosis not present

## 2017-02-02 NOTE — Therapy (Signed)
[facility] [place], [place], [address] Phone: [phone]   Fax:  [phone]  Physical Therapy Treatment  Patient Details  Name: Leslie Roberson MRN: [id number] Date of Birth: May 16, 1955 Referring Provider: Dr. Georgina Snell    Encounter Date: 02/02/2017  PT End of Session - 02/02/17 1247    Visit Number  4    Number of Visits  12    Date for PT Re-Evaluation  03/04/17    PT Start Time  1103    PT Stop Time  1152    PT Time Calculation (min)  49 min    Activity Tolerance  Patient tolerated treatment well       Past Medical History:  Diagnosis Date  . Anxiety   . Asthma, chronic 11/27/2015  . Depression   . Diverticulitis of colon 12/21/2015   Colonoscopy every 5 years/digestive health.   . GERD (gastroesophageal reflux disease) 11/27/2015  . Idiopathic scoliosis 01/22/2016  . Osteoporosis 11/27/2015   On prolia.   . Thyroid disease     Past Surgical History:  Procedure Laterality Date  . ABDOMINAL HYSTERECTOMY    . bladder mesh    . ELBOW SURGERY Right   . SPINAL FUSION    . TONSILLECTOMY    . TOTAL HIP ARTHROPLASTY      There were no vitals filed for this visit.  Subjective Assessment - 02/02/17 1244    Subjective  Still having pain in the hip and difficulty sleeping. Manual work helped some. Biggest trouble is sleeping. sleeping some in the recliner to get off the Rt hip.     Currently in Pain?  Yes    Pain Score  8     Pain Location  Hip    Pain Orientation  Right;Anterior;Lateral    Pain Descriptors / Indicators  Aching;Sore;Tightness    Pain Type  Acute pain    Pain Radiating Towards  lateral thigh     Pain Onset  More than a month ago    Pain Frequency  Intermittent    Aggravating Factors   lying on Rt side    Pain Relieving Factors  staying off Rt side; topical medication                      [facility] Adult PT Treatment/Exercise - 02/02/17 0001      Knee/Hip  Exercises: Stretches   Passive Hamstring Stretch  Right;Left;2 reps;30 seconds    Piriformis Stretch  Right;Left;30 seconds;3 reps strap to assist   strap to assist   Other Knee/Hip Stretches  ITB stretch in supine with strap x 30 sec x 2 reps each leg.       Knee/Hip Exercises: Standing   Hip Abduction  Stengthening;Right;Left;2 sets;10 reps leading with heel   leading with heel   Hip Extension  Stengthening;Right;Left;2 sets;10 reps      Knee/Hip Exercises: Supine   Other Supine Knee/Hip Exercises  bridge with glute squeeze x10      Moist Heat Therapy   Number Minutes Moist Heat  20 Minutes    Moist Heat Location  Lumbar Spine;Hip bilat applied in supine   bilat applied in supine     Electrical Stimulation   Electrical Stimulation Location  bilat hips     Electrical Stimulation Action  IFC    Electrical Stimulation Parameters  to tolerance    Electrical Stimulation Goals  Pain;Tone      Manual Therapy   Manual  therapy comments  pt prone     Soft tissue mobilization  deep tissue work bilat posterior hips sacrum to posterior greater trochanter Rt > Lt; Rt anterior lateral hip musculature to Rt thoracic rib cage     Myofascial Release  bilat posterior hips                   PT Long Term Goals - 02/02/17 1257      PT LONG TERM GOAL #1   Title  independent with HEP (03/05/17)    Time  6    Period  Weeks    Status  On-going      PT LONG TERM GOAL #2   Title  improve L hip abduction strength to 4+/5 for improved activity tolerance (03/05/17)    Time  6    Period  Weeks    Status  On-going      PT LONG TERM GOAL #3   Title  report ability to walk > 15 min without increase in pain for improved activity tolerance and decreased pain (03/05/17)    Time  6    Period  Weeks    Status  On-going      PT LONG TERM GOAL #4   Title  improve FOTO to </= 41% limited (03/05/17)    Time  6    Period  Weeks    Status  On-going      PT LONG TERM GOAL #5   Title  decrease  pain in Rt hip by 50-75% allowing patient to sleep on Rt side for 3-4 hours at night (03/05/17)    Time  6    Period  Weeks    Status  On-going            Plan - 02/02/17 1254    Clinical Impression Statement  Muscular tightness and pain bilat lateral hips Rt greater than Lt with greatest pain on Rt in the anterior lateral hip and Lt in posterior lateral hip. Responds well to manual work.  Difficult to create lasting change due to lying on Rt side only at night and being unable to use pillow between knees to sleep shich should help Lt hip pain.      Rehab Potential  Good    PT Frequency  2x / week    PT Duration  6 weeks    PT Treatment/Interventions  Patient/family education;ADLs/Self Care Home Management;Cryotherapy;Electrical Stimulation;Iontophoresis 4mg /ml Dexamethasone;Moist Heat;Ultrasound;Dry needling;Manual techniques;Therapeutic activities;Therapeutic exercise;Neuromuscular re-education    PT Next Visit Plan  continue stretches for piriformis/hip musculature; DN/manual work bilat hips; modalities as indicated     Consulted and Agree with Plan of Care  Patient       Patient will benefit from skilled therapeutic intervention in order to improve the following deficits and impairments:  Postural dysfunction, Improper body mechanics, Pain, Increased fascial restricitons, Increased muscle spasms, Decreased strength, Decreased mobility, Decreased range of motion, Decreased activity tolerance  Visit Diagnosis: Pain of right hip joint  Other symptoms and signs involving the musculoskeletal system  Weakness generalized  Pain of left hip joint     Problem List Patient Active Problem List   Diagnosis Date Noted  . Lump in throat 01/27/2017  . Dysphagia 01/27/2017  . No energy 09/15/2016  . Bad taste in mouth 07/26/2016  . Paresthesia 04/03/2016  . Facet hypertrophy of lumbosacral region 04/03/2016  . Chronic back pain 04/03/2016  . Right knee pain 02/19/2016  . Rosacea  02/04/2016  . Vaginal  atrophy 01/31/2016  . Idiopathic scoliosis 01/22/2016  . Hip bursitis 12/25/2015  . Biceps tendonitis on right 12/25/2015  . Diverticulitis of colon 12/21/2015  . GERD (gastroesophageal reflux disease) 11/27/2015  . Osteoporosis 11/27/2015  . GAD (generalized anxiety disorder) 11/27/2015  . MDD (major depressive disorder), recurrent, in full remission ([place]) 11/27/2015  . Chronic dryness of both eyes 11/27/2015  . Hypothyroidism 11/27/2015  . Asthma, chronic 11/27/2015  . Insomnia 11/27/2015  . Seborrheic keratoses 11/27/2015    Celyn Nilda Simmer PT, MPH  02/02/2017, 12:58 PM  [facility] [place], [place], [address] Phone: [phone]   Fax:  [phone]  Name: Eliyanah Elgersma MRN: [id number] Date of Birth: 26-Jun-1955

## 2017-02-03 DIAGNOSIS — E039 Hypothyroidism, unspecified: Secondary | ICD-10-CM | POA: Diagnosis not present

## 2017-02-03 DIAGNOSIS — Z131 Encounter for screening for diabetes mellitus: Secondary | ICD-10-CM | POA: Diagnosis not present

## 2017-02-03 DIAGNOSIS — Z13220 Encounter for screening for lipoid disorders: Secondary | ICD-10-CM | POA: Diagnosis not present

## 2017-02-03 LAB — COMPLETE METABOLIC PANEL WITH GFR
AG RATIO: 1.5 (calc) (ref 1.0–2.5)
ALKALINE PHOSPHATASE (APISO): 71 U/L (ref 33–130)
ALT: 38 U/L — ABNORMAL HIGH (ref 6–29)
AST: 23 U/L (ref 10–35)
Albumin: 4 g/dL (ref 3.6–5.1)
BUN: 20 mg/dL (ref 7–25)
CALCIUM: 9.4 mg/dL (ref 8.6–10.4)
CO2: 29 mmol/L (ref 20–32)
Chloride: 104 mmol/L (ref 98–110)
Creat: 0.92 mg/dL (ref 0.50–0.99)
GFR, EST NON AFRICAN AMERICAN: 67 mL/min/{1.73_m2} (ref >=60)
GFR, Est African American: 78 mL/min/{1.73_m2} (ref >=60)
GLOBULIN: 2.6 g/dL (ref 1.9–3.7)
Glucose, Bld: 104 mg/dL — ABNORMAL HIGH (ref 65–99)
POTASSIUM: 4.2 mmol/L (ref 3.5–5.3)
SODIUM: 140 mmol/L (ref 135–146)
Total Bilirubin: 0.3 mg/dL (ref 0.2–1.2)
Total Protein: 6.6 g/dL (ref 6.1–8.1)

## 2017-02-03 LAB — LIPID PANEL W/REFLEX DIRECT LDL
Cholesterol: 185 mg/dL (ref <200)
HDL: 60 mg/dL (ref >50)
LDL Cholesterol (Calc): 107 mg/dL (calc) — ABNORMAL HIGH
NON-HDL CHOLESTEROL (CALC): 125 mg/dL (ref <130)
Total CHOL/HDL Ratio: 3.1 (calc) (ref <5.0)
Triglycerides: 90 mg/dL (ref <150)

## 2017-02-03 LAB — TSH: TSH: 1.67 mIU/L (ref 0.40–4.50)

## 2017-02-04 ENCOUNTER — Encounter: Payer: Self-pay | Admitting: Family Medicine

## 2017-02-04 ENCOUNTER — Ambulatory Visit (HOSPITAL_COMMUNITY): Payer: Self-pay | Admitting: Psychiatry

## 2017-02-04 ENCOUNTER — Ambulatory Visit: Payer: BLUE CROSS/BLUE SHIELD | Admitting: Family Medicine

## 2017-02-04 VITALS — BP 136/80 | HR 79 | Wt 126.0 lb

## 2017-02-04 DIAGNOSIS — M7061 Trochanteric bursitis, right hip: Secondary | ICD-10-CM

## 2017-02-04 DIAGNOSIS — L89301 Pressure ulcer of unspecified buttock, stage 1: Secondary | ICD-10-CM | POA: Diagnosis not present

## 2017-02-04 MED ORDER — NORTRIPTYLINE HCL 25 MG PO CAPS: 25.0000 mg | ORAL_CAPSULE | Freq: Every day | ORAL | 1 refills | Status: DC

## 2017-02-04 NOTE — Progress Notes (Signed)
Leslie Roberson is a 61 y.o. female who presents to [facility] today for hip pain.  Leslie Roberson presents to clinic today to follow-up her persistent lateral right-sided hip pain.  She started to have trochanteric bursitis but has failed to improve despite conservative therapy and unguided injection.  At this point she is frustrated and wishes to proceed with MRI that she was hesitant to get her additionally she notes that she was advised that she should follow-up with orthopedic surgeon for her left total hip replacement.  She like to follow-up with orthopedic surgery for both left total hip replacement routine care as well as to address her right lateral hip pain.  She notes any red area on her backside that she is somewhat concerned with.  She notes that she has been sleeping upright in a recliner to help prevent her right hip pain from worsening.  She is developed 2 small red areas on her buttocks that are not particularly tender.   Past Medical History:  Diagnosis Date  . Anxiety   . Asthma, chronic 11/27/2015  . Depression   . Diverticulitis of colon 12/21/2015   Colonoscopy every 5 years/digestive health.   . GERD (gastroesophageal reflux disease) 11/27/2015  . Idiopathic scoliosis 01/22/2016  . Osteoporosis 11/27/2015   On prolia.   . Thyroid disease    Past Surgical History:  Procedure Laterality Date  . ABDOMINAL HYSTERECTOMY    . bladder mesh    . ELBOW SURGERY Right   . SPINAL FUSION    . TONSILLECTOMY    . TOTAL HIP ARTHROPLASTY     Social History   Tobacco Use  . Smoking status: Never Smoker  . Smokeless tobacco: Never Used  Substance Use Topics  . Alcohol use: Yes     ROS:  As above   Medications: Current Outpatient Medications  Medication Sig Dispense Refill  . albuterol (PROVENTIL HFA;VENTOLIN HFA) 108 (90 Base) MCG/ACT inhaler Inhale 1-2 puffs into the lungs every 4 (four) hours as needed for wheezing or  shortness of breath. 1 Inhaler 3  . busPIRone (BUSPAR) 30 MG tablet TAKE 1 TABLET (30 MG TOTAL) BY MOUTH 2 (TWO) TIMES DAILY. 60 tablet 2  . clonazePAM (KLONOPIN) 2 MG tablet Take 1.5 tablets (3 mg total) by mouth daily. (Patient taking differently: Take 2 mg by mouth daily. Pt states taking .25 tablet by mouth daily.) 45 tablet 5  . denosumab (PROLIA) 60 MG/ML SOLN injection Inject 60 mg into the skin every 6 (six) months. Administer in upper arm, thigh, or abdomen    . desvenlafaxine (PRISTIQ) 100 MG 24 hr tablet TAKE ONE TABLET BY MOUTH DAILY 30 tablet 4  . desvenlafaxine (PRISTIQ) 50 MG 24 hr tablet TAKE ONE TABLET BY MOUTH DAILY 90 tablet 1  . dexlansoprazole (DEXILANT) 60 MG capsule TAKE 1 CAPSULE (60 MG) BY MOUTH DAILY 90 capsule 0  . diclofenac sodium (VOLTAREN) 1 % GEL APPLY 4G TOPICALLY 4 TIMES DAILY 100 g 12  . escitalopram (LEXAPRO) 5 MG tablet Take 1 tablet (5 mg total) by mouth daily. 30 tablet 0  . flunisolide (NASAREL) 29 MCG/ACT (0.025%) nasal spray Place 2 sprays into the nose daily as needed for rhinitis. Dose is for each nostril.    Marland Kitchen gabapentin (NEURONTIN) 300 MG capsule TAKE 1 CAP AT BEDTIME X1 WEEK,1 CAP TWICE DAILY X1 WEEK THEN 1 CAP 3X DAILY MAY DOUBLE WEEKLY=3600MG  180 capsule 3  . ipratropium (ATROVENT) 0.06 % nasal spray Place  2 sprays into both nostrils 4 (four) times daily. 15 mL 1  . levothyroxine (SYNTHROID, LEVOTHROID) 125 MCG tablet TAKE ONE-HALF TABLET BY MOUTH ONCE DAILY 30 tablet 2  . lidocaine (LIDODERM) 5 % Place 1 patch onto the skin every 12 (twelve) hours. Remove & Discard patch within 12 hours or as directed by MD 30 patch 12  . naproxen sodium (ANAPROX) 220 MG tablet Take 220 mg by mouth as needed.    . nortriptyline (PAMELOR) 25 MG capsule Take 1 capsule (25 mg total) at bedtime by mouth. 30 capsule 1  . Omega-3 Fatty Acids (FISH OIL PO) Take 1 tablet by mouth daily.    . traZODone (DESYREL) 50 MG tablet TAKE 1 TABLET (50 MG TOTAL) BY MOUTH AT BEDTIME. 90  tablet 1  . triamcinolone ointment (KENALOG) 0.1 % Apply 1 application topically 2 (two) times daily. To affected area(s) as needed. Use no longer than 1-2 weeks. 30 g 0   No current facility-administered medications for this visit.    Allergies  Allergen Reactions  . Levofloxacin Other (See Comments)    hallucinations  . Adhesive [Tape]   . Demerol [Meperidine] Nausea And Vomiting and Other (See Comments)    Doesn't work well for my pain.  . Dilaudid [Hydromorphone] Rash     Exam:  BP 136/80   Pulse 79   Wt 126 lb (57.2 kg)   BMI 27.27 kg/m  General: Well Developed, well nourished, and in no acute distress.  Neuro/Psych: Alert and oriented x3, extra-ocular muscles intact, able to move all 4 extremities, sensation grossly intact. Skin: Warm and dry, no rashes noted.  Respiratory: Not using accessory muscles, speaking in full sentences, trachea midline.  Cardiovascular: Pulses palpable, no extremity edema. Abdomen: Does not appear distended. MSK: Right hip normal-appearing tender to palpation greater trochanter. Skin buttocks to small red areas consistent with grade 1 pressure sores overlying the ischial tuberosities bilaterally     Assessment and Plan: 61 y.o. female with  Hip pain greater trochanteric bursitis likely.  Plan to proceed with MRI to evaluate for tendon disruption.  Patient requests referral to orthopedic surgery at this point which I think is reasonable.  Try to increase pain control with nortriptyline.  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  She should establish care with an orthopedic surgeon in this area to continue current management.  Refer to Dr. Mayer Camel.  Pressure sore: Very mild and early.  Plan for offloading and gel cushion.  Recheck as needed.    Orders Placed This Encounter  Procedures  . MR HIP RIGHT WO CONTRAST    Standing Status:   Future    Standing Expiration Date:    04/06/2018    Order Specific Question:   What is the patient's sedation requirement?    Answer:   No Sedation    Order Specific Question:   Does the patient have a pacemaker or implanted devices?    Answer:   No    Order Specific Question:   Preferred imaging location?    Answer:   Product/process development scientist (table limit-350lbs)    Order Specific Question:   Radiology Contrast Protocol - do NOT remove file path    Answer:   [email or web address]  . Ambulatory referral to Orthopedic Surgery    Referral Priority:   Routine    Referral Type:   Surgical    Referral Reason:   Specialty Services Required  Referred to Provider:   Frederik Pear, MD    Requested Specialty:   Orthopedic Surgery    Number of Visits Requested:   1   Meds ordered this encounter  Medications  . nortriptyline (PAMELOR) 25 MG capsule    Sig: Take 1 capsule (25 mg total) at bedtime by mouth.    Dispense:  30 capsule    Refill:  1    Discussed warning signs or symptoms. Please see discharge instructions. Patient expresses understanding.

## 2017-02-04 NOTE — Patient Instructions (Signed)
Thank you for coming in today. You should hear from Dr Damita Dunnings office soon.  You should also hear about MRI.  Start nortriptyline at night.   Work on relieving pressure on your sit bones.  Try to get a dedicated pressure cushion.    Pressure Injury A pressure injury, sometimes called a bedsore, is an injury to the skin and underlying tissue caused by pressure. Pressure on blood vessels causes decreased blood flow to the skin, which can eventually cause the skin tissue to die and break down into a wound. Pressure injuries usually occur:  Over bony parts of the body such as the tailbone, shoulders, elbows, hips, and heels.  Under medical devices such as respiratory equipment, stockings, tubes, and splints.  Pressure injuries start as reddened areas on the skin and can lead to pain, muscle damage, and infection. Pressure injuries can vary in severity. What are the causes? Pressure injuries are caused by a lack of blood supply to an area of skin. They can occur from intense pressure over a short period of time or from less intense pressure over a long period of time. What increases the risk? This condition is more likely to develop in people who:  Are in the hospital or an extended care facility.  Are bedridden or in a wheelchair.  Have an injury or disease that keeps them from: ? Moving normally. ? Feeling pain or pressure.  Have a condition that: ? Makes them sleepy or less alert. ? Causes poor blood flow.  Need to wear a medical device.  Have poor control of their bladder or bowel functions (incontinence).  Have poor nutrition (malnutrition).  Are of certain ethnicities. People of African American and Latino or Hispanic descent are at higher risk compared to other ethnic groups.  If you are at risk for pressure ulcers, your health care provider may recommend certain types of bedding to help prevent them. These may include foam or gel mattresses covered with one of the  following:  A sheepskin blanket.  A pad that is filled with gel, air, water, or foam.  What are the signs or symptoms? The main symptom is a blister or change in skin color that opens into a wound. Other symptoms include:  Red or dark areas of skin that do not turn white or pale when pressed with a finger.  Pain, warmth, or change of skin texture.  How is this diagnosed? This condition is diagnosed with a medical history and physical exam. You may also have tests, including:  Blood tests to check for infection or signs of poor nutrition.  Imaging studies to check for damage to the deep tissues under your skin.  Blood flow studies.  Your pressure injury will be staged to determine its severity. Staging is an assessment of:  The depth of the pressure injury.  Which tissues are exposed because of the pressure injury.  The causes of the pressure injury.  How is this treated? The main focus of treatment is to help your injury heal. This may be done by:  Relieving or redistributing pressure on your skin. This includes: ? Frequently changing your position. ? Eliminating or minimizing positions that caused the wound or that can make the wound worse. ? Using specific bed mattresses and chair cushions. ? Refitting, resizing, or replacing any medical devices, or padding the skin under them. ? Using creams or powders to prevent rubbing (friction) on the skin.  Keeping your skin clean and dry. This may include using  a skin cleanser or skin protectant as told by your health care provider. This may be a lotion, ointment, or spray.  Cleaning your injury and removing any dead tissue from the wound (debridement).  Placing a bandage (dressing) over your injury.  Preventing or treating infection. This may include antibiotic, antimicrobial, or antiseptic medicines.  Treatment may also include medicine for pain. Sometimes surgery is needed to close the wound with a flap of healthy skin or  a piece of skin from another area of your body (graft). You may need surgery if other treatments are not working or if your injury is very deep. Follow these instructions at home: Wound care  Follow instructions from your health care provider about: ? How to take care of your wound. ? When and how you should change your dressing. ? When you should remove your dressing. If your dressing is dry and stuck when you try to remove it, moisten or wet the dressing with saline or water so that it can be removed without harming your skin or wound tissue.  Check your wound every day for signs of infection. Have a caregiver do this for you if you are not able. Watch for: ? More redness, swelling, or pain. ? More fluid, blood, or pus. ? A bad smell. Skin Care  Keep your skin clean and dry. Gently pat your skin dry.  Do not rub or massage your skin.  Use a skin protectant only as told by your health care provider.  Check your skin every day for any changes in color or any new blisters or sores (ulcers). Have a caregiver do this for you if you are not able. Medicines  Take over-the-counter and prescription medicines only as told by your health care provider.  If you were prescribed an antibiotic medicine, take it or apply it as told by your health care provider. Do not stop taking or using the antibiotic even if your condition improves. Reducing and Redistributing Pressure  Do not lie or sit in one position for a long time. Move or change position every two hours or as told by your health care provider.  Use pillows or cushions to reduce pressure. Ask your health care provider to recommend cushions or pads for you.  Use medical devices that do not rub your skin. Tell your health care provider if one of your medical devices is causing a pressure injury to develop. General instructions   Eat a healthy diet that includes lots of protein. Ask your health care provider for diet advice.  Drink  enough fluid to keep your urine clear or pale yellow.  Be as active as you can every day. Ask your health care provider to suggest safe exercises or activities.  Do not abuse drugs or alcohol.  Keep all follow-up visits as told by your health care provider. This is important.  Do not smoke. Contact a health care provider if:   You have chills or fever.  Your pain medicine is not helping.  You have any changes in skin color.  You have new blisters or sores.  You develop warmth, redness, or swelling near a pressure injury.  You have a bad odor or pus coming from your pressure injury.  You lose control of your bowels or bladder.  You develop new symptoms.  Your wound does not improve after 1-2 weeks of treatment.  You develop a new medical condition, such as diabetes, peripheral vascular disease, or conditions that affect your defense (immune) system.  This information is not intended to replace advice given to you by your health care provider. Make sure you discuss any questions you have with your health care provider. Document Released: 03/17/2005 Document Revised: 08/20/2015 Document Reviewed: 07/26/2014 Elsevier Interactive Patient Education  [employer].

## 2017-02-05 ENCOUNTER — Telehealth (HOSPITAL_COMMUNITY): Payer: Self-pay | Admitting: Psychiatry

## 2017-02-05 ENCOUNTER — Encounter: Payer: Self-pay | Admitting: Rehabilitative and Restorative Service Providers"

## 2017-02-05 ENCOUNTER — Other Ambulatory Visit: Payer: Self-pay

## 2017-02-05 ENCOUNTER — Ambulatory Visit: Payer: BLUE CROSS/BLUE SHIELD | Admitting: Physician Assistant

## 2017-02-05 ENCOUNTER — Ambulatory Visit (INDEPENDENT_AMBULATORY_CARE_PROVIDER_SITE_OTHER): Payer: BLUE CROSS/BLUE SHIELD | Admitting: Rehabilitative and Restorative Service Providers"

## 2017-02-05 ENCOUNTER — Telehealth: Payer: Self-pay | Admitting: Physician Assistant

## 2017-02-05 ENCOUNTER — Encounter: Payer: Self-pay | Admitting: Physician Assistant

## 2017-02-05 VITALS — BP 112/68 | HR 92 | Ht <= 58 in | Wt 126.0 lb

## 2017-02-05 DIAGNOSIS — R7301 Impaired fasting glucose: Secondary | ICD-10-CM

## 2017-02-05 DIAGNOSIS — G44201 Tension-type headache, unspecified, intractable: Secondary | ICD-10-CM | POA: Diagnosis not present

## 2017-02-05 DIAGNOSIS — R29898 Other symptoms and signs involving the musculoskeletal system: Secondary | ICD-10-CM | POA: Diagnosis not present

## 2017-02-05 DIAGNOSIS — M79604 Pain in right leg: Secondary | ICD-10-CM | POA: Diagnosis not present

## 2017-02-05 DIAGNOSIS — R531 Weakness: Secondary | ICD-10-CM | POA: Diagnosis not present

## 2017-02-05 DIAGNOSIS — M25551 Pain in right hip: Secondary | ICD-10-CM

## 2017-02-05 DIAGNOSIS — G44221 Chronic tension-type headache, intractable: Secondary | ICD-10-CM | POA: Insufficient documentation

## 2017-02-05 DIAGNOSIS — R7303 Prediabetes: Secondary | ICD-10-CM | POA: Diagnosis not present

## 2017-02-05 LAB — POCT GLYCOSYLATED HEMOGLOBIN (HGB A1C): Hemoglobin A1C: 6.3

## 2017-02-05 MED ORDER — KETOROLAC TROMETHAMINE 30 MG/ML IJ SOLN
30.0000 mg | Freq: Once | INTRAMUSCULAR | Status: AC
  Administered 2017-02-05: 30 mg via INTRAMUSCULAR

## 2017-02-05 NOTE — Telephone Encounter (Signed)
Routed to PCP 

## 2017-02-05 NOTE — Progress Notes (Addendum)
Subjective:    Patient ID: Leslie Roberson, female    DOB: Nov 06, 1955, 61 y.o.   MRN: [id number]  HPI  Patient is a 61 year old female who presents to the clinic with acute persistent dull bilateral headache for the last 2 weeks. Describes the headache as "squeezing".  She denies any auras, vision changes, sensitivities to light, sensitivity to sound, or nausea.  She was started on Lexapro but does not correlate to when her headaches started.  And yesterday was started on nortriptyline but her headaches had already started.  The only thing that has changed is she has been tapered off her Xanax that she has been taking for years by her psychiatrist.  Right now she is taking 0.25 mg.  This is been a very hard transition as she has been on this medicine for many years.  She also feels like her anxiety and depression are very out of control right now.  She sees a psychiatrist for this.  Patient has tried aspirin for her headaches with no results.  She feels like Tylenol does not help her headaches.  Pt was worried HAs could be due to BP.   Pt is also concern about a painful knot on right anterior shin. No SOB, CP. No hx of recent surgery or travel.   .. Active Ambulatory Problems    Diagnosis Date Noted  . GERD (gastroesophageal reflux disease) 11/27/2015  . Osteoporosis 11/27/2015  . GAD (generalized anxiety disorder) 11/27/2015  . MDD (major depressive disorder), recurrent, in full remission ([place]) 11/27/2015  . Chronic dryness of both eyes 11/27/2015  . Hypothyroidism 11/27/2015  . Asthma, chronic 11/27/2015  . Insomnia 11/27/2015  . Seborrheic keratoses 11/27/2015  . Diverticulitis of colon 12/21/2015  . Hip bursitis 12/25/2015  . Biceps tendonitis on right 12/25/2015  . Idiopathic scoliosis 01/22/2016  . Vaginal atrophy 01/31/2016  . Rosacea 02/04/2016  . Right knee pain 02/19/2016  . Paresthesia 04/03/2016  . Facet hypertrophy of lumbosacral region 04/03/2016  . Chronic back  pain 04/03/2016  . Bad taste in mouth 07/26/2016  . No energy 09/15/2016  . Lump in throat 01/27/2017  . Dysphagia 01/27/2017  . Chronic tension-type headache, intractable 02/05/2017  . Prediabetes 02/05/2017  . Right leg pain 02/06/2017   Resolved Ambulatory Problems    Diagnosis Date Noted  . Acute medial meniscus tear 04/23/2016  . Left knee pain 04/28/2016  . Mild intermittent asthma without complication 25/42/7062   Past Medical History:  Diagnosis Date  . Anxiety   . Asthma, chronic 11/27/2015  . Depression   . Diverticulitis of colon 12/21/2015  . GERD (gastroesophageal reflux disease) 11/27/2015  . Idiopathic scoliosis 01/22/2016  . Osteoporosis 11/27/2015  . Thyroid disease         Review of Systems  All other systems reviewed and are negative.      Objective:   Physical Exam  Constitutional: She is oriented to person, place, and time. She appears well-developed and well-nourished.  HENT:  Head: Normocephalic and atraumatic.  No tenderness over temple to palpation.   Eyes: Conjunctivae are normal. Right eye exhibits no discharge. Left eye exhibits no discharge.  Neck: Normal range of motion. Neck supple.  Cardiovascular: Normal rate, regular rhythm and normal heart sounds.  Pulmonary/Chest: Effort normal and breath sounds normal.  Musculoskeletal:  I did not see any swelling, bruising, erythema. No palpable knot felt. She did express some very pinpoint tenderness over shin bone mid leg.   Neurological: She is alert and  oriented to person, place, and time.  Psychiatric: She has a normal mood and affect. Her behavior is normal.          Assessment & Plan:  Marland KitchenMarland KitchenDiagnoses and all orders for this visit:  Acute intractable tension-type headache -     ketorolac (TORADOL) 30 MG/ML injection 30 mg  IFG (impaired fasting glucose) -     POCT HgB A1C  Prediabetes  Right leg pain -     US Venous Img Lower Unilateral Right; Future -     US Venous Img Lower  Unilateral Right      I feel like patient's headaches correspond to tension headache likely due to coming off Klonopin and adding more stress/anxiety/tension.  Right now patient is on a lot of sedating medications.  I do not think it is reasonable to add a muscle relaxer.  I did discuss some symptomatic conservative treatment such as heat around the neck at night, massages and using Aleve for headache rescue and prevention.  I do think as she gets used to coming off of the Klonopin but this could get better.  She started a TCA yesterday.  This could possibly make her headaches a little better as well.  Discussed red flag and warning signs with patient.  Due to bilateral nature and no temporal tenderness I do not suspect a temporal arteritis.  I reassured patient that her blood pressure was amazing and it is not her blood pressure causing her headaches.  I encouraged her to closely follow-up with psychiatry as they work on improving her overall stress and anxiety while getting her off this controlled substance.  Follow up in 4 weeks for headache.   A1c today was 6.3.  Discussed this is prediabetes range.  No medication needed at this time but lifestyle changes are strongly encouraged.  Discussed low sugar and low carb diet.  Strongly encourage exercise in the least 150 minutes a day.  Will recheck in 3-6 months to make sure not trending into diabetic range.  I did not palpate anything today. Pt called back and said knot is getting bigger and more painful. She is concerned about blood clot. Order U/S stat to rule out DVT  .Marland KitchenSpent 30 minutes with patient and greater than 50 percent of visit spent counseling patient regarding treatment plan.

## 2017-02-05 NOTE — Telephone Encounter (Signed)
Work on sleep hygiene and it may take a day or two more for med to work . Avoid caffeine

## 2017-02-05 NOTE — Telephone Encounter (Signed)
Dicussed sedation risk with patient and reviewed medications. Ok patient to continue.

## 2017-02-05 NOTE — Telephone Encounter (Signed)
Pt states that she seen Dr Georgina Snell yesterday and she was given a rx of nortriptyline because she is still not sleeping. When she picked the rx up at the pharmacy, the pharmacist states she should not take that med with the trazadone that we give her. Pt did not take the trazadone last night, but pt is still not sleeping. Pt would like to know what you recommend she can take.   Please advise.

## 2017-02-05 NOTE — Telephone Encounter (Signed)
Lvm for pt to return call to office. Per Dr. De Nurse, please inform pt to work on sleep hygiene. Please inform pt medications may take a few days to work. Pt should avoid caffeine. Informed pt if she has questions or concerns to contact. Nothing further is need at this time.

## 2017-02-05 NOTE — Therapy (Signed)
[facility] [place], [place], [address] Phone: [phone]   Fax:  [phone]  Physical Therapy Treatment  Patient Details  Name: Leslie Roberson MRN: [id number] Date of Birth: [birth year] Referring Provider: Dr. Georgina Snell    Encounter Date: 02/05/2017  PT End of Session - 02/05/17 1102    Visit Number  5    Number of Visits  12    Date for PT Re-Evaluation  03/04/17    PT Start Time  1102    PT Stop Time  1153    PT Time Calculation (min)  51 min    Activity Tolerance  Patient tolerated treatment well       Past Medical History:  Diagnosis Date  . Anxiety   . Asthma, chronic 11/27/2015  . Depression   . Diverticulitis of colon 12/21/2015   Colonoscopy every 5 years/digestive health.   . GERD (gastroesophageal reflux disease) 11/27/2015  . Idiopathic scoliosis 01/22/2016  . Osteoporosis 11/27/2015   On prolia.   . Thyroid disease     Past Surgical History:  Procedure Laterality Date  . ABDOMINAL HYSTERECTOMY    . bladder mesh    . ELBOW SURGERY Right   . SPINAL FUSION    . TONSILLECTOMY    . TOTAL HIP ARTHROPLASTY      There were no vitals filed for this visit.  Subjective Assessment - 02/05/17 1145    Subjective  Still having pain in the hip and difficulty sleeping.  Biggest problem is sleeping. sleeping some in the recliner to get off the Rt hip. No significant change in symtpoms since beginning PT.     Currently in Pain?  Yes    Pain Score  7     Pain Location  Hip    Pain Orientation  Right    Pain Descriptors / Indicators  Aching;Tightness;Sore                      [facility] Adult PT Treatment/Exercise - 02/05/17 0001      Moist Heat Therapy   Number Minutes Moist Heat  20 Minutes    Moist Heat Location  Lumbar Spine;Hip bilat applied in supine   bilat applied in supine     Electrical Stimulation   Electrical Stimulation Location  bilat hips     Electrical Stimulation  Action  HVG    Electrical Stimulation Parameters  to tolerance    Electrical Stimulation Goals  Pain;Tone      Manual Therapy   Manual therapy comments  pt prone and supine     Soft tissue mobilization  deep tissue work anterior Rt hip through hip flexors/adductors; bilat posterior hips sacrum to posterior greater trochanter Rt > Lt; Rt anterior lateral hip musculature to Rt thoracic rib cage     Myofascial Release  bilat posterior hips     Passive ROM  PROM IR/ER; flexion/abduction     Manual Traction  through extended leg                   PT Long Term Goals - 02/02/17 1257      PT LONG TERM GOAL #1   Title  independent with HEP (03/05/17)    Time  6    Period  Weeks    Status  On-going      PT LONG TERM GOAL #2   Title  improve L hip abduction strength to 4+/5 for improved activity tolerance (03/05/17)  Time  6    Period  Weeks    Status  On-going      PT LONG TERM GOAL #3   Title  report ability to walk > 15 min without increase in pain for improved activity tolerance and decreased pain (03/05/17)    Time  6    Period  Weeks    Status  On-going      PT LONG TERM GOAL #4   Title  improve FOTO to </= 41% limited (03/05/17)    Time  6    Period  Weeks    Status  On-going      PT LONG TERM GOAL #5   Title  decrease pain in Rt hip by 50-75% allowing patient to sleep on Rt side for 3-4 hours at night (03/05/17)    Time  6    Period  Weeks    Status  On-going            Plan - 02/05/17 1150    Clinical Impression Statement  Working through the anterior hip through hip flexors and adductors as well as posterior hip, adding hip mobs and traching through the Rt LE with LE extended. Will assess response to increased manual work.     PT Frequency  2x / week    PT Duration  6 weeks    PT Treatment/Interventions  Patient/family education;ADLs/Self Care Home Management;Cryotherapy;Electrical Stimulation;Iontophoresis 4mg /ml Dexamethasone;Moist Heat;Ultrasound;Dry  needling;Manual techniques;Therapeutic activities;Therapeutic exercise;Neuromuscular re-education    PT Next Visit Plan  assess response to increased manual work Rt hip; continue stretches for piriformis/hip musculature; DN/manual work bilat hips; modalities as indicated     Consulted and Agree with Plan of Care  Patient       Patient will benefit from skilled therapeutic intervention in order to improve the following deficits and impairments:  Postural dysfunction, Improper body mechanics, Pain, Increased fascial restricitons, Increased muscle spasms, Decreased strength, Decreased mobility, Decreased range of motion, Decreased activity tolerance  Visit Diagnosis: Pain of right hip joint  Other symptoms and signs involving the musculoskeletal system  Weakness generalized     Problem List Patient Active Problem List   Diagnosis Date Noted  . Lump in throat 01/27/2017  . Dysphagia 01/27/2017  . No energy 09/15/2016  . Bad taste in mouth 07/26/2016  . Paresthesia 04/03/2016  . Facet hypertrophy of lumbosacral region 04/03/2016  . Chronic back pain 04/03/2016  . Right knee pain 02/19/2016  . Rosacea 02/04/2016  . Vaginal atrophy 01/31/2016  . Idiopathic scoliosis 01/22/2016  . Hip bursitis 12/25/2015  . Biceps tendonitis on right 12/25/2015  . Diverticulitis of colon 12/21/2015  . GERD (gastroesophageal reflux disease) 11/27/2015  . Osteoporosis 11/27/2015  . GAD (generalized anxiety disorder) 11/27/2015  . MDD (major depressive disorder), recurrent, in full remission ([place]) 11/27/2015  . Chronic dryness of both eyes 11/27/2015  . Hypothyroidism 11/27/2015  . Asthma, chronic 11/27/2015  . Insomnia 11/27/2015  . Seborrheic keratoses 11/27/2015    Zackry Deines Nilda Simmer PT, MPH  02/05/2017, 11:53 AM  [facility] [place], [place], [address] Phone: [phone]   Fax:  [phone]  Name: Leslie Roberson MRN:  [id number] Date of Birth: Dec 27, 1955

## 2017-02-05 NOTE — Patient Instructions (Signed)
Tension Headache A tension headache is pain, pressure, or aching that is felt over the front and sides of your head. These headaches can last from 30 minutes to several days. Follow these instructions at home: Managing pain  Take over-the-counter and prescription medicines only as told by your doctor.  Lie down in a dark, quiet room when you have a headache.  If directed, apply ice to your head and neck area: ? Put ice in a plastic bag. ? Place a towel between your skin and the bag. ? Leave the ice on for 20 minutes, 2-3 times per day.  Use a heating pad or a hot shower to apply heat to your head and neck area as told by your doctor. Eating and drinking  Eat meals on a regular schedule.  Do not drink a lot of alcohol.  Do not use a lot of caffeine, or stop using caffeine. General instructions  Keep all follow-up visits as told by your doctor. This is important.  Keep a journal to find out if certain things bring on headaches. For example, write down: ? What you eat and drink. ? How much sleep you get. ? Any change to your diet or medicines.  Try getting a massage, or doing other things that help you to relax.  Lessen stress.  Sit up straight. Do not tighten (tense) your muscles.  Do not use tobacco products. This includes cigarettes, chewing tobacco, or e-cigarettes. If you need help quitting, ask your doctor.  Exercise regularly as told by your doctor.  Get enough sleep. This may mean 7-9 hours of sleep. Contact a doctor if:  Your symptoms are not helped by medicine.  You have a headache that feels different from your usual headache.  You feel sick to your stomach (nauseous) or you throw up (vomit).  You have a fever. Get help right away if:  Your headache becomes very bad.  You keep throwing up.  You have a stiff neck.  You have trouble seeing.  You have trouble speaking.  You have pain in your eye or ear.  Your muscles are weak or you lose muscle  control.  You lose your balance or you have trouble walking.  You feel like you will pass out (faint) or you pass out.  You have confusion. This information is not intended to replace advice given to you by your health care provider. Make sure you discuss any questions you have with your health care provider. Document Released: 06/11/2009 Document Revised: 11/15/2015 Document Reviewed: 07/10/2014 Elsevier Interactive Patient Education  2018 [employer]  

## 2017-02-05 NOTE — Telephone Encounter (Signed)
Pt called. Pharmacist is concerned about her mixing  Nortriptyline with the other meds she's already taking.  Thank you.

## 2017-02-06 ENCOUNTER — Ambulatory Visit: Payer: BLUE CROSS/BLUE SHIELD

## 2017-02-06 ENCOUNTER — Telehealth: Payer: Self-pay | Admitting: Physician Assistant

## 2017-02-06 DIAGNOSIS — M79604 Pain in right leg: Secondary | ICD-10-CM | POA: Insufficient documentation

## 2017-02-06 IMAGING — US US EXTREM LOW VENOUS*R*
1 series · 13 of 24 positions shown · non-contrast
Comparison: None.

CLINICAL DATA: 61-year-old female the with a tender not in the
anterior right lower leg



[Series 1: us extrem low venous*right* · 0.06mm/px · 13 of 29 slices shown]
[im 1/29]
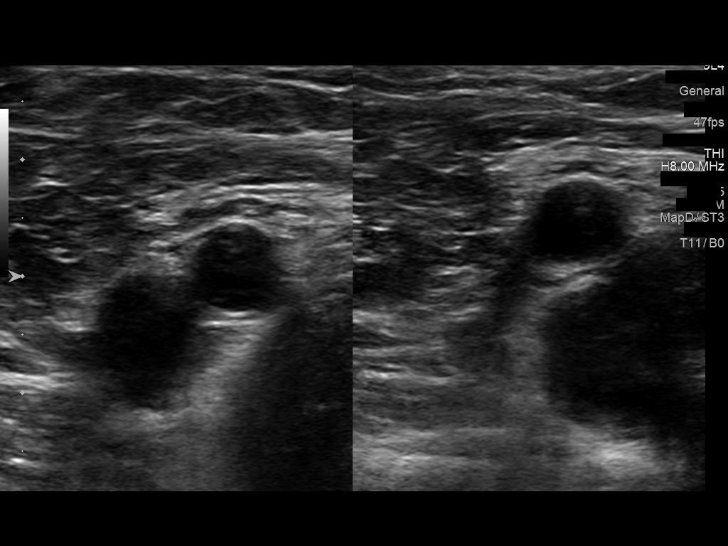
[im 3/29]
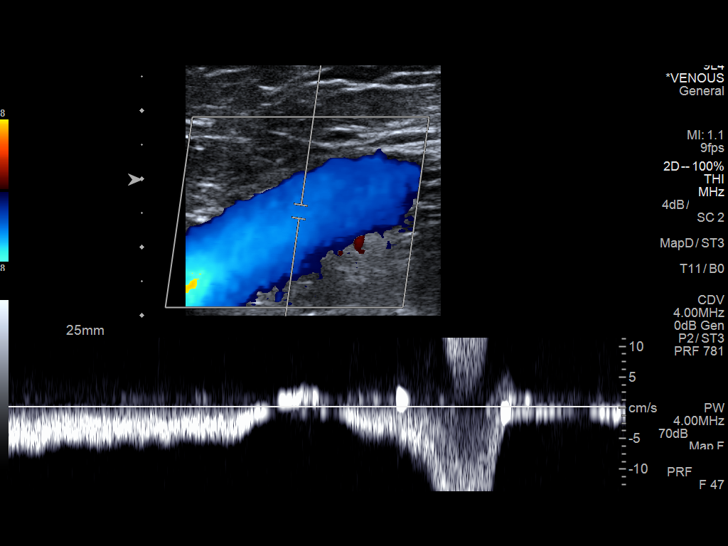
[im 5/29]
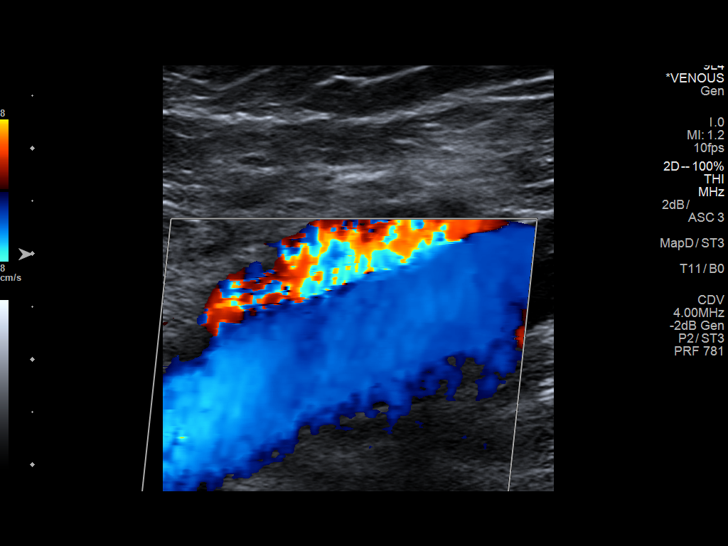
[im 8/29]
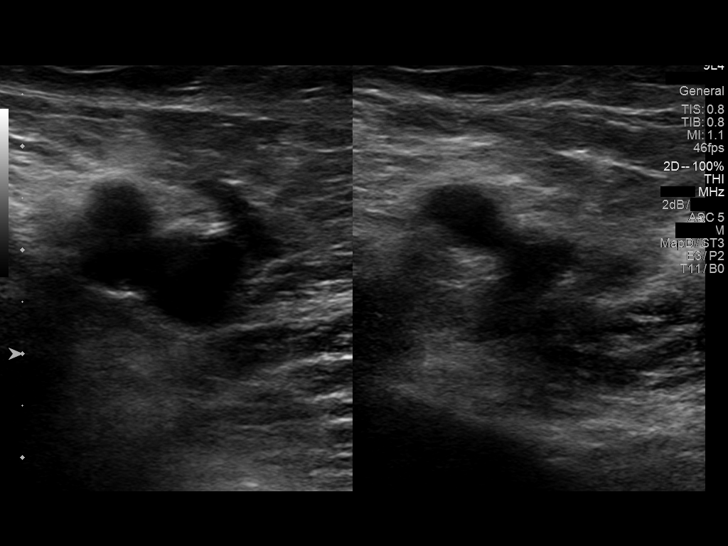
[im 10/29]
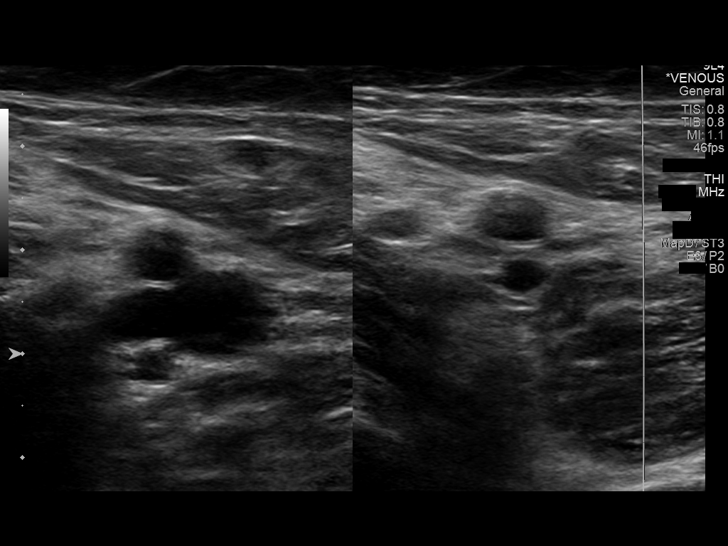
[im 13/29]
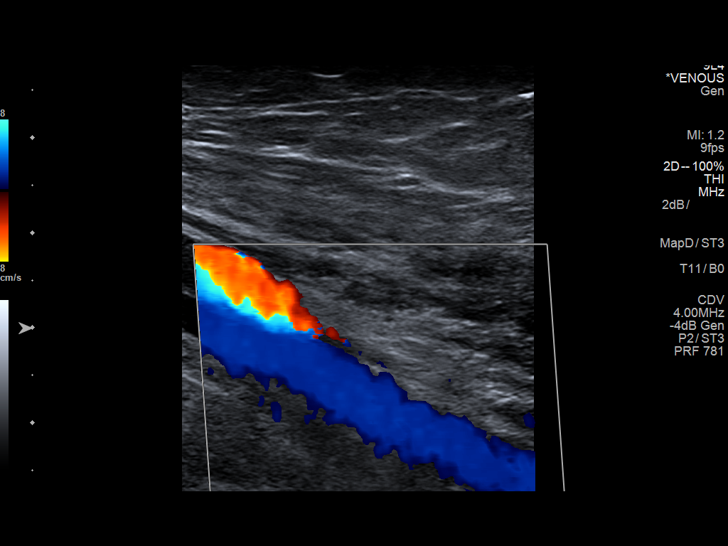
[im 15/29]
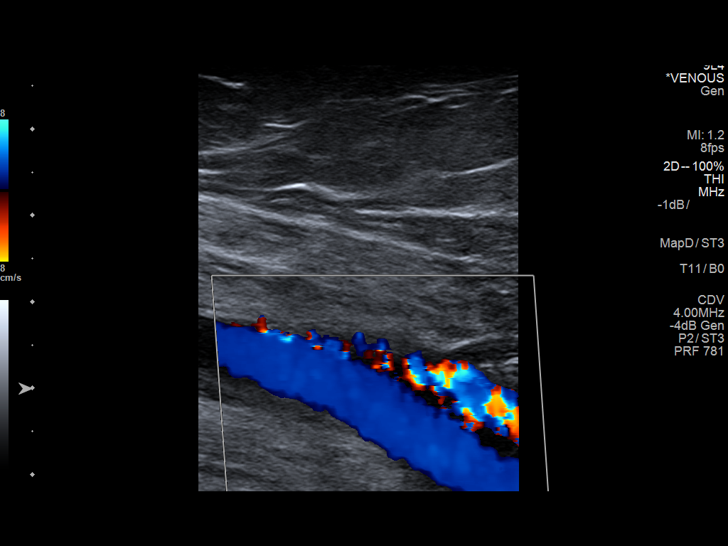
[im 16/29]
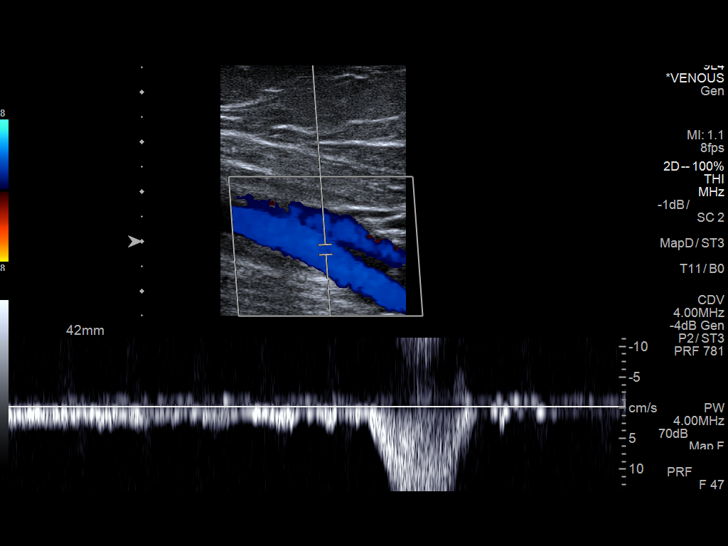
[im 19/29]
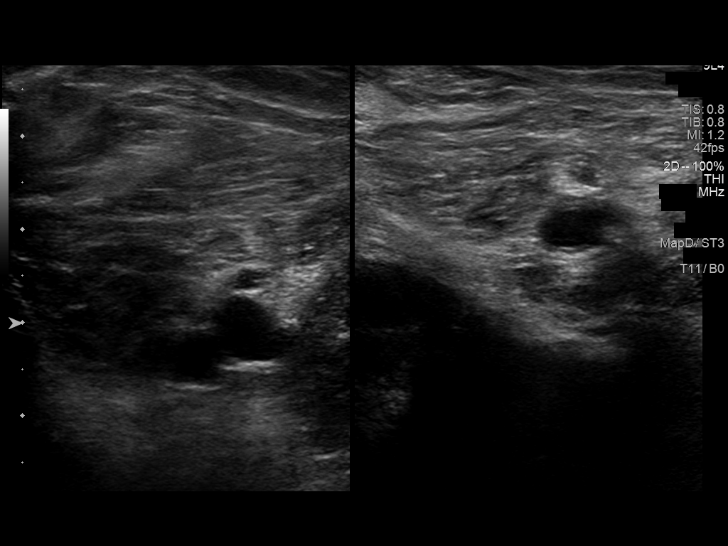
[im 21/29]
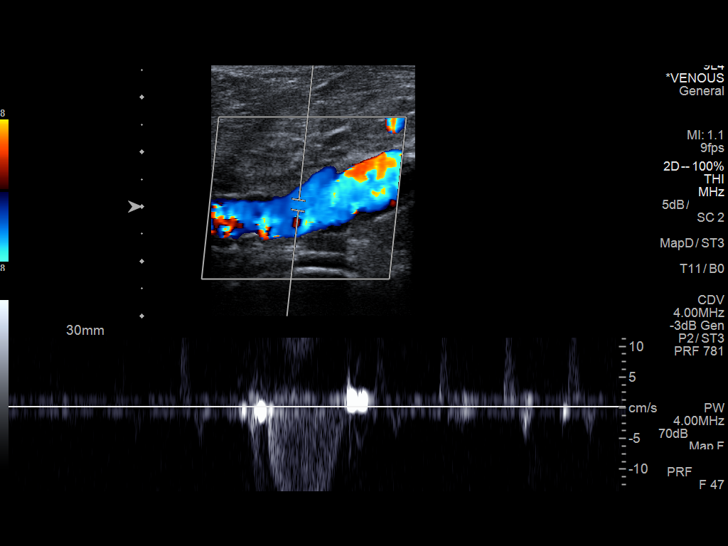
[im 24/29]
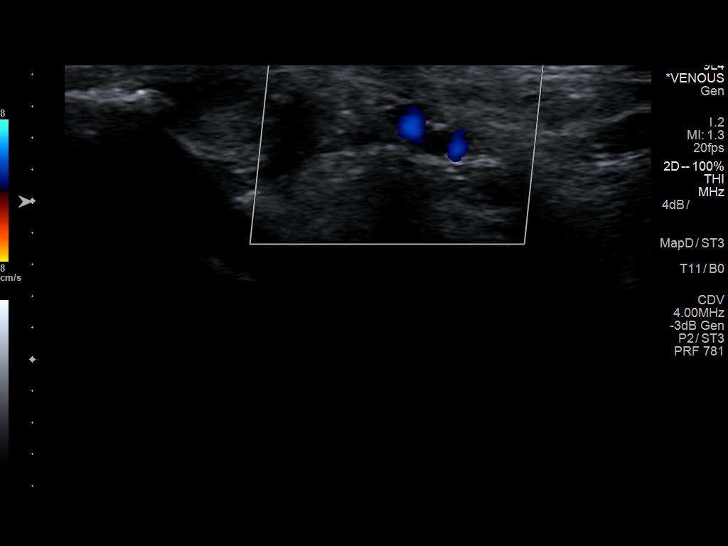
[im 26/29]
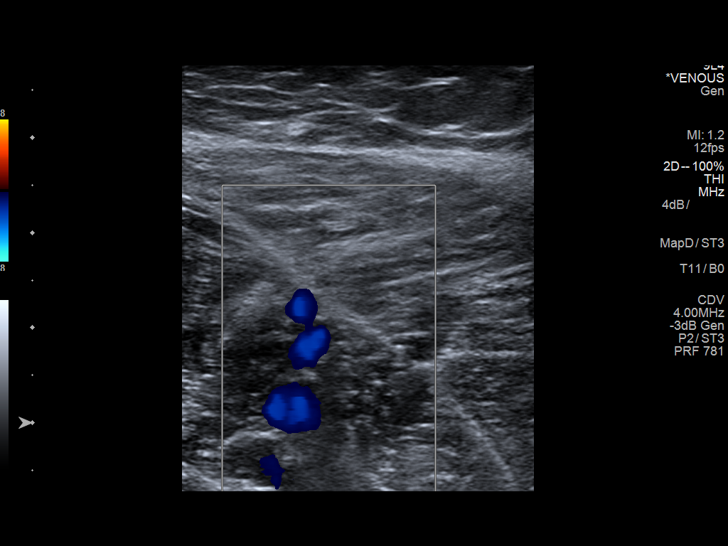
[im 29/29]
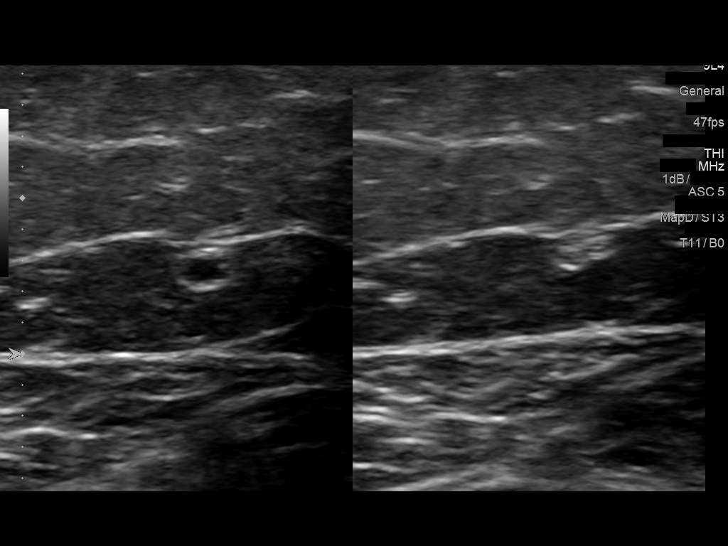

[13 of 24 positions shown; findings below may reference images not displayed]

FINDINGS: Contralateral Common Femoral Vein: Respiratory phasicity is normal
and symmetric with the symptomatic side. No evidence of thrombus.
Normal compressibility.

Common Femoral Vein: No evidence of thrombus. Normal
compressibility, respiratory phasicity and response to augmentation.

Saphenofemoral Junction: No evidence of thrombus. Normal
compressibility and flow on color Doppler imaging.

Profunda Femoral Vein: No evidence of thrombus. Normal
compressibility and flow on color Doppler imaging.

Femoral Vein: No evidence of thrombus. Normal compressibility,
respiratory phasicity and response to augmentation.

Popliteal Vein: No evidence of thrombus. Normal compressibility,
respiratory phasicity and response to augmentation.

Calf Veins: No evidence of thrombus. Normal compressibility and flow
on color Doppler imaging.

Superficial Great Saphenous Vein: No evidence of thrombus. Normal
compressibility.

Venous Reflux:  None.

Other Findings:  None
IMPRESSION: No evidence of deep venous thrombosis.

## 2017-02-06 NOTE — Telephone Encounter (Signed)
I will send order to have leg u/s. Come in for that. She does not need to be seen again.

## 2017-02-06 NOTE — Telephone Encounter (Signed)
Patient called advised that she was just seen yesterday and she thought she had a blood clot but you could not feel anything and now she has a knot in her leg and believes that it is a clot. Should this patient come in and see Dr. Madilyn Fireman I have blocked a slot just in case you say yes or should she get an ultrasound possibly. Please adv before you leave today.

## 2017-02-06 NOTE — Telephone Encounter (Signed)
Thank you :)

## 2017-02-06 NOTE — Addendum Note (Signed)
Addended by: Donella Stade on: 02/06/2017 11:53 AM   Modules accepted: Orders

## 2017-02-06 NOTE — Telephone Encounter (Signed)
Pt was notified that imaging would call to schedule her.

## 2017-02-09 ENCOUNTER — Other Ambulatory Visit: Payer: Self-pay

## 2017-02-09 ENCOUNTER — Encounter: Payer: Self-pay | Admitting: Rehabilitative and Restorative Service Providers"

## 2017-02-09 ENCOUNTER — Ambulatory Visit: Payer: BLUE CROSS/BLUE SHIELD | Admitting: Rehabilitative and Restorative Service Providers"

## 2017-02-09 DIAGNOSIS — R1310 Dysphagia, unspecified: Secondary | ICD-10-CM | POA: Diagnosis not present

## 2017-02-09 DIAGNOSIS — R29898 Other symptoms and signs involving the musculoskeletal system: Secondary | ICD-10-CM

## 2017-02-09 DIAGNOSIS — M25551 Pain in right hip: Secondary | ICD-10-CM | POA: Diagnosis not present

## 2017-02-09 DIAGNOSIS — R531 Weakness: Secondary | ICD-10-CM | POA: Diagnosis not present

## 2017-02-09 DIAGNOSIS — K219 Gastro-esophageal reflux disease without esophagitis: Secondary | ICD-10-CM | POA: Diagnosis not present

## 2017-02-09 DIAGNOSIS — Z86010 Personal history of colonic polyps: Secondary | ICD-10-CM | POA: Diagnosis not present

## 2017-02-09 NOTE — Therapy (Signed)
[facility] [place], [place], [address] Phone: [phone]   Fax:  [phone]  Physical Therapy Treatment  Patient Details  Name: Leslie Roberson MRN: [id number] Date of Birth: Oct 03, 1955 Referring Provider: Dr. Georgina Snell    Encounter Date: 02/09/2017  PT End of Session - 02/09/17 1016    Visit Number  6    Number of Visits  12    Date for PT Re-Evaluation  03/04/17    PT Start Time  9381    PT Stop Time  1113    PT Time Calculation (min)  58 min    Activity Tolerance  Patient tolerated treatment well       Past Medical History:  Diagnosis Date  . Anxiety   . Asthma, chronic 11/27/2015  . Depression   . Diverticulitis of colon 12/21/2015   Colonoscopy every 5 years/digestive health.   . GERD (gastroesophageal reflux disease) 11/27/2015  . Idiopathic scoliosis 01/22/2016  . Osteoporosis 11/27/2015   On prolia.   . Thyroid disease     Past Surgical History:  Procedure Laterality Date  . ABDOMINAL HYSTERECTOMY    . bladder mesh    . ELBOW SURGERY Right   . SPINAL FUSION    . TONSILLECTOMY    . TOTAL HIP ARTHROPLASTY      There were no vitals filed for this visit.  Subjective Assessment - 02/09/17 1016    Subjective  Good day after last treatment - awoke without pain and slept much better. Began to hurt again that next night. Now back to same level as before.     Currently in Pain?  Yes    Pain Score  9  when trying to sleep - no pain when walking     Pain Location  Hip    Pain Orientation  Right    Pain Descriptors / Indicators  Aching;Tightness;Sore    Pain Type  Acute pain    Pain Onset  More than a month ago    Pain Frequency  Intermittent                      [facility] Adult PT Treatment/Exercise - 02/09/17 0001      Knee/Hip Exercises: Stretches   Passive Hamstring Stretch  Right;Left;2 reps;30 seconds    Piriformis Stretch  Right;Left;30 seconds;3 reps    Other  Knee/Hip Stretches  ITB stretch in supine with strap x 30 sec x 2 reps each leg.       Moist Heat Therapy   Number Minutes Moist Heat  20 Minutes    Moist Heat Location  Lumbar Spine;Hip bilat applied in supine      Electrical Stimulation   Electrical Stimulation Location  bilat hips     Electrical Stimulation Action  HVG    Electrical Stimulation Parameters  to tolerance    Electrical Stimulation Goals  Pain;Tone      Manual Therapy   Manual therapy comments  pt prone and supine     Soft tissue mobilization  deep tissue work anterior Rt hip through hip flexors/adductors; bilat posterior hips sacrum to posterior greater trochanter Rt > Lt; Rt anterior lateral hip musculature to Rt thoracic rib cage     Myofascial Release  bilat posterior hips     Passive ROM  PROM IR/ER; flexion/abduction     Manual Traction  through extended leg              PT  Education - 02/09/17 1101    Education provided  Yes    Education Details  encouraged ball release work and different positions for sleep     Person(s) Educated  Patient    Methods  Explanation    Comprehension  Verbalized understanding          PT Long Term Goals - 02/02/17 1257      PT LONG TERM GOAL #1   Title  independent with HEP (03/05/17)    Time  6    Period  Weeks    Status  On-going      PT LONG TERM GOAL #2   Title  improve L hip abduction strength to 4+/5 for improved activity tolerance (03/05/17)    Time  6    Period  Weeks    Status  On-going      PT LONG TERM GOAL #3   Title  report ability to walk > 15 min without increase in pain for improved activity tolerance and decreased pain (03/05/17)    Time  6    Period  Weeks    Status  On-going      PT LONG TERM GOAL #4   Title  improve FOTO to </= 41% limited (03/05/17)    Time  6    Period  Weeks    Status  On-going      PT LONG TERM GOAL #5   Title  decrease pain in Rt hip by 50-75% allowing patient to sleep on Rt side for 3-4 hours at night (03/05/17)     Time  6    Period  Weeks    Status  On-going            Plan - 02/09/17 1024    Clinical Impression Statement  Good response to manual work. Slept better and awoke without pain. Continued work through the anterior hip in hip flexors and adductors as well as posterior hip.     Rehab Potential  Good    PT Frequency  2x / week    PT Duration  6 weeks    PT Treatment/Interventions  Patient/family education;ADLs/Self Care Home Management;Cryotherapy;Electrical Stimulation;Iontophoresis 4mg /ml Dexamethasone;Moist Heat;Ultrasound;Dry needling;Manual techniques;Therapeutic activities;Therapeutic exercise;Neuromuscular re-education    PT Next Visit Plan  excellent response to increased manual work Rt hip; continue stretches for piriformis/hip musculature; DN/manual work bilat hips; modalities as indicated     Consulted and Agree with Plan of Care  Patient       Patient will benefit from skilled therapeutic intervention in order to improve the following deficits and impairments:  Postural dysfunction, Improper body mechanics, Pain, Increased fascial restricitons, Increased muscle spasms, Decreased strength, Decreased mobility, Decreased range of motion, Decreased activity tolerance  Visit Diagnosis: Pain of right hip joint  Other symptoms and signs involving the musculoskeletal system  Weakness generalized     Problem List Patient Active Problem List   Diagnosis Date Noted  . Right leg pain 02/06/2017  . Chronic tension-type headache, intractable 02/05/2017  . Prediabetes 02/05/2017  . Lump in throat 01/27/2017  . Dysphagia 01/27/2017  . No energy 09/15/2016  . Bad taste in mouth 07/26/2016  . Paresthesia 04/03/2016  . Facet hypertrophy of lumbosacral region 04/03/2016  . Chronic back pain 04/03/2016  . Right knee pain 02/19/2016  . Rosacea 02/04/2016  . Vaginal atrophy 01/31/2016  . Idiopathic scoliosis 01/22/2016  . Hip bursitis 12/25/2015  . Biceps tendonitis on right  12/25/2015  . Diverticulitis of colon 12/21/2015  .  GERD (gastroesophageal reflux disease) 11/27/2015  . Osteoporosis 11/27/2015  . GAD (generalized anxiety disorder) 11/27/2015  . MDD (major depressive disorder), recurrent, in full remission ([place]) 11/27/2015  . Chronic dryness of both eyes 11/27/2015  . Hypothyroidism 11/27/2015  . Asthma, chronic 11/27/2015  . Insomnia 11/27/2015  . Seborrheic keratoses 11/27/2015    Leslie Roberson Nilda Simmer PT, MPH 02/09/2017, 11:03 AM  [facility] [place], [place], [address] Phone: [phone]   Fax:  [phone]  Name: Leslie Roberson MRN: [id number] Date of Birth: [birth year]

## 2017-02-10 DIAGNOSIS — H2513 Age-related nuclear cataract, bilateral: Secondary | ICD-10-CM | POA: Diagnosis not present

## 2017-02-11 ENCOUNTER — Encounter: Payer: Self-pay | Admitting: Physical Therapy

## 2017-02-11 ENCOUNTER — Telehealth (HOSPITAL_COMMUNITY): Payer: Self-pay | Admitting: Psychiatry

## 2017-02-11 NOTE — Telephone Encounter (Signed)
Pt would like to speak to dr De Nurse. Pt states she is falling apart again. She is not sleeping. Her nerves are shot. She also has a pressure headache which means her BP is mostly likely up.   Please advise.

## 2017-02-11 NOTE — Telephone Encounter (Signed)
Return telephone call to pt. Per pt, she is currently taking 1/4 tablet of Klonopin. Per conversation with Dr. De Nurse, please inform pt to increase klonopin dosage from 1/4 to 1/2 a tablet. Pt should work sleep hygiene. Offered pt information about a sleep study. Pt refused at this time. Informed pt to follow up with office within 3-5 days to provide office staff with her sleeping status. Pt shows understanding.

## 2017-02-12 ENCOUNTER — Ambulatory Visit (HOSPITAL_COMMUNITY): Payer: Self-pay | Admitting: Psychiatry

## 2017-02-12 ENCOUNTER — Ambulatory Visit
Admission: RE | Admit: 2017-02-12 | Discharge: 2017-02-12 | Disposition: A | Discharge status: Home / Self Care | Payer: BLUE CROSS/BLUE SHIELD | Source: Ambulatory Visit | Attending: Family Medicine | Admitting: Family Medicine

## 2017-02-12 DIAGNOSIS — M25551 Pain in right hip: Secondary | ICD-10-CM | POA: Diagnosis not present

## 2017-02-12 DIAGNOSIS — M7061 Trochanteric bursitis, right hip: Secondary | ICD-10-CM

## 2017-02-12 IMAGING — MR MR HIP*R* W/O CM
5 series · 36 of 40 positions shown · non-contrast
Comparison: Pelvic x-rays dated January 16, 2012.

CLINICAL DATA: Right hip pain for the past 7 months. Evaluate for
trochanteric bursitis.

EXAM:
MR OF THE RIGHT HIP WITHOUT CONTRAST
TECHNIQUE: Multiplanar, multisequence MR imaging was performed. No intravenous
contrast was administered.

[Series 3: STIR · coronal · right · 3.5mm · 1.12mm/px · 6 of 20 slices shown]
[im 1/20]
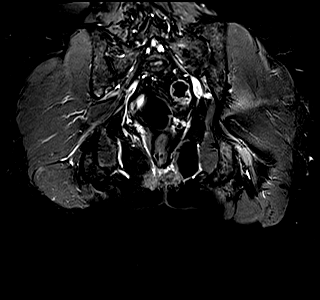
[im 4/20]
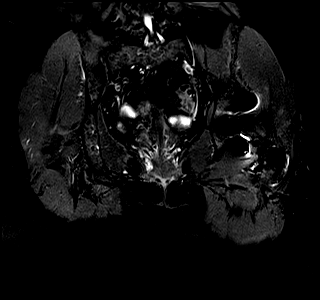
[im 8/20]
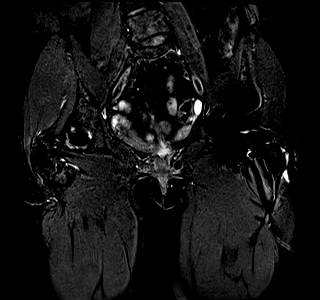
[im 12/20]
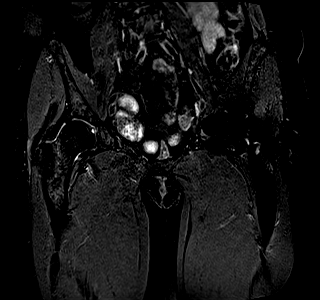
[im 16/20]
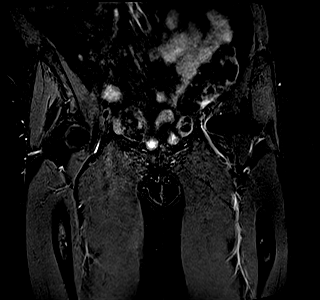
[im 20/20]
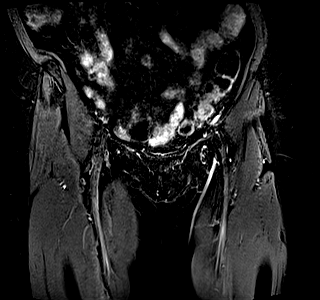

[Series 4: T1 · coronal · right · 3.5mm · 0.80mm/px · 7 of 20 slices shown (1 of 2)]
[im 1/20]
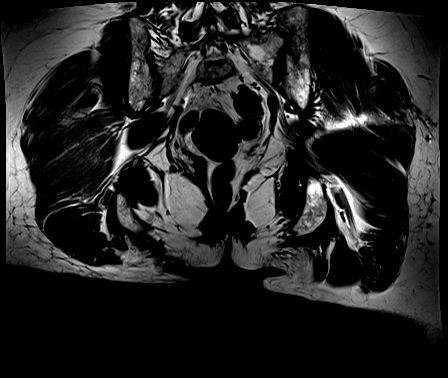
[im 4/20]
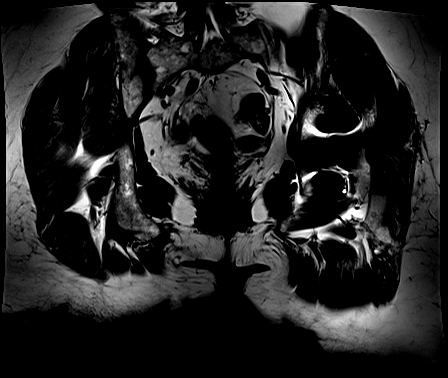
[im 7/20]
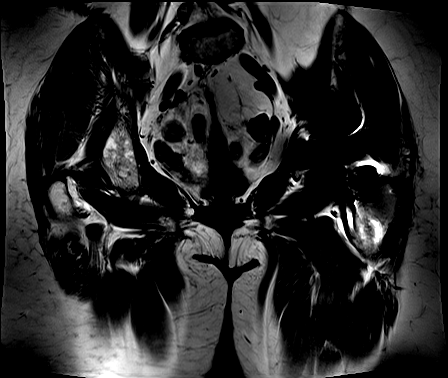
[im 10/20]
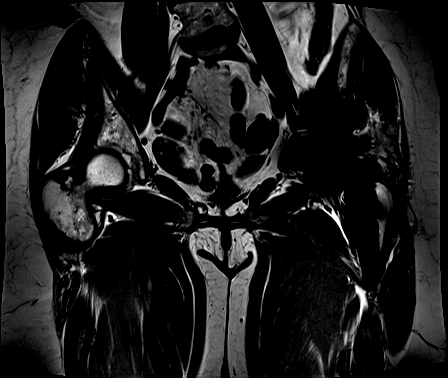
[im 13/20]
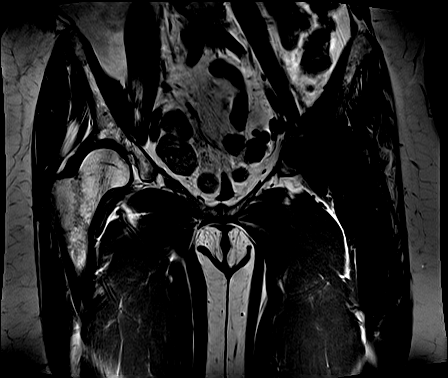
[im 16/20]
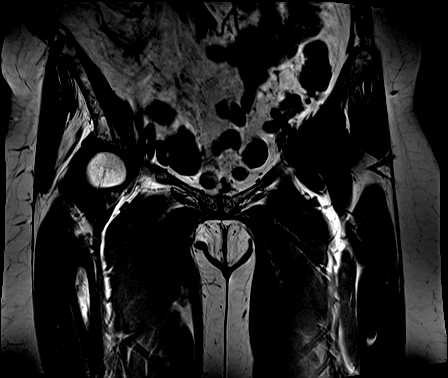
[im 20/20]
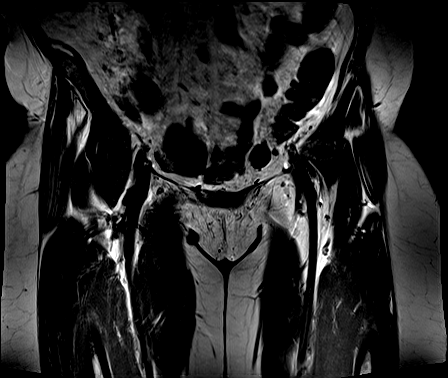

[Series 5: T1 · axial · right · 3.5mm · 0.78mm/px · z∈[+10,+102]mm · 9 of 24 slices shown (2 of 2)]
[im 1/24]
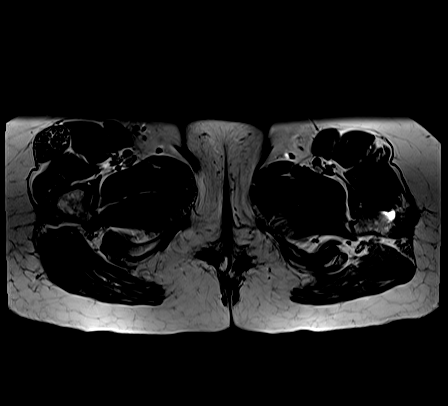
[im 3/24]
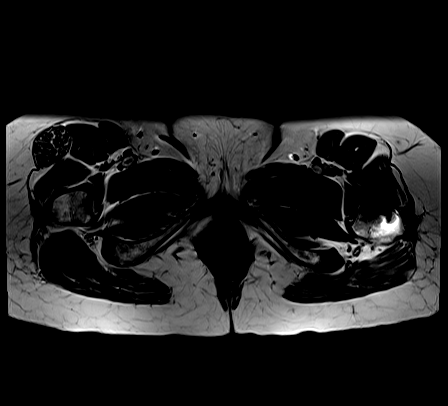
[im 6/24]
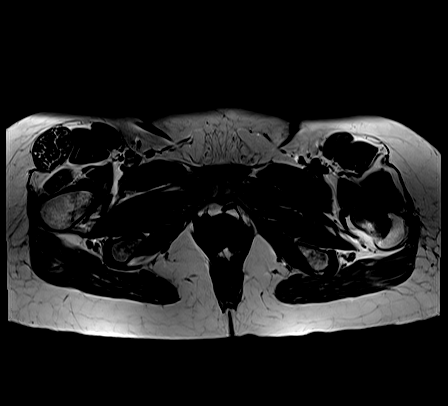
[im 9/24]
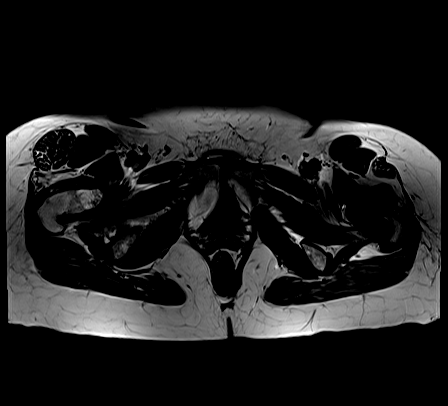
[im 12/24]
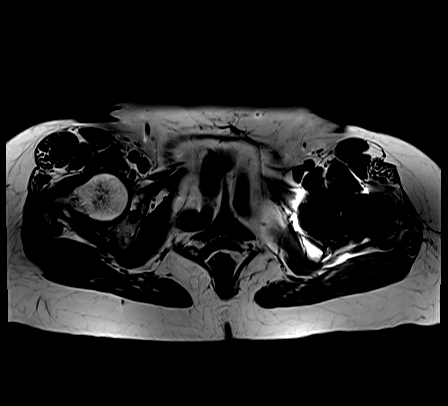
[im 15/24]
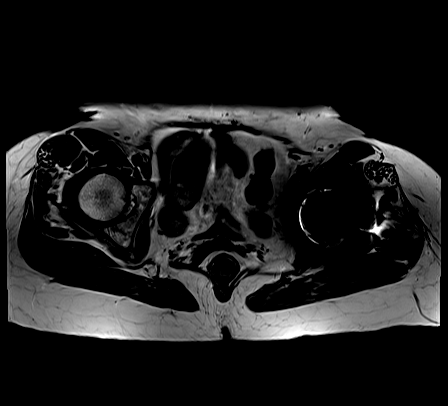
[im 18/24]
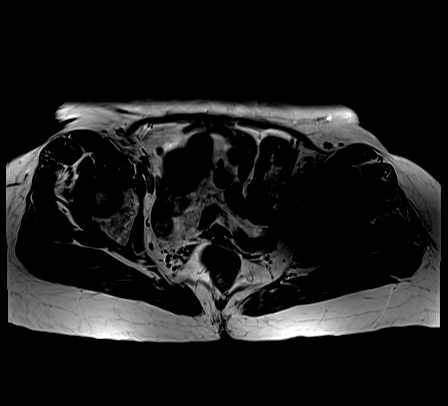
[im 21/24]
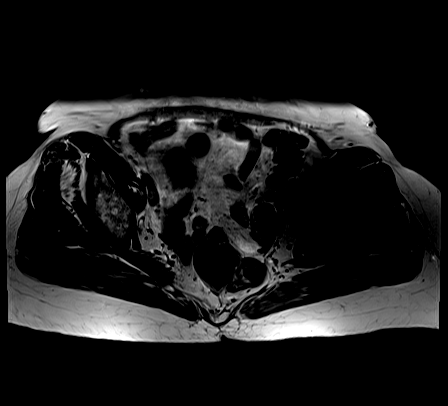
[im 24/24]
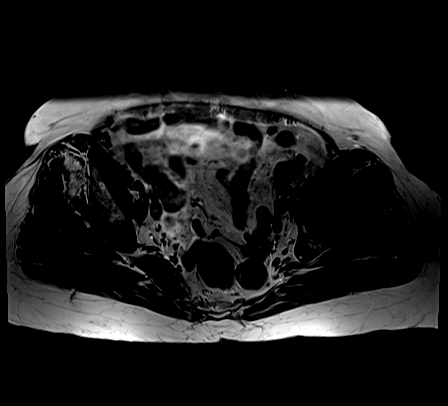

[Series 6: T2 · axial · right · 3.5mm · 0.91mm/px · z∈[+10,+66]mm · 5 of 24 slices shown]
[im 1/24]
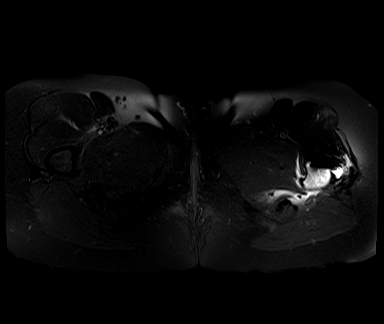
[im 3/24]
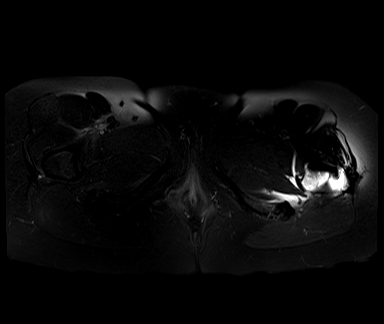
[im 6/24]
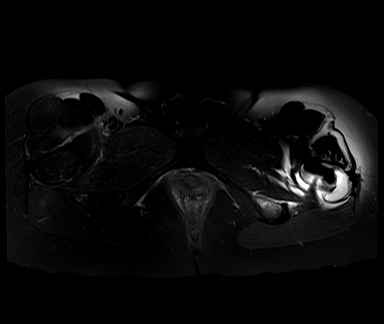
[im 9/24]
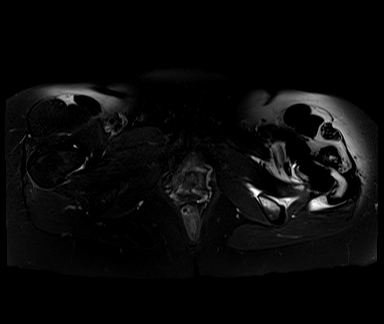
[im 15/24]
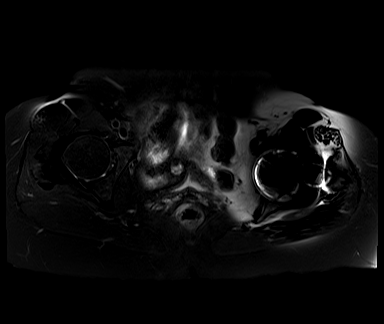

[Series 7: PD · sagittal · right · 3.5mm · 0.56mm/px · 9 of 24 slices shown]
[im 1/24]
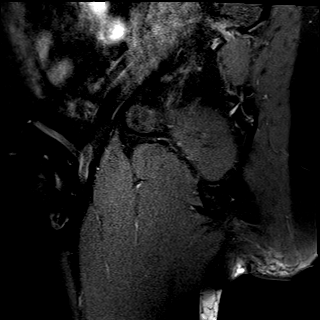
[im 3/24]
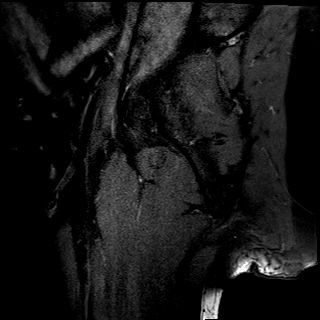
[im 6/24]
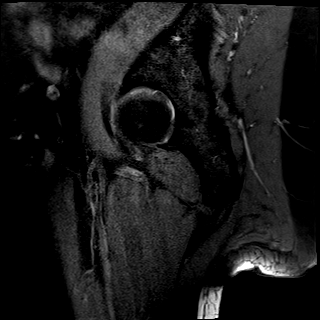
[im 9/24]
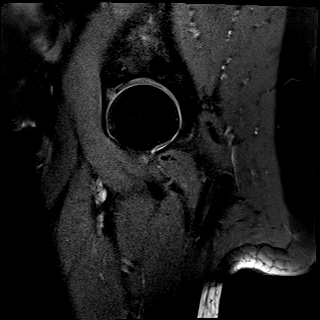
[im 12/24]
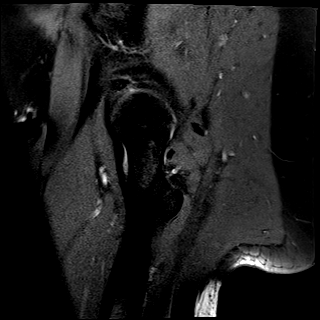
[im 15/24]
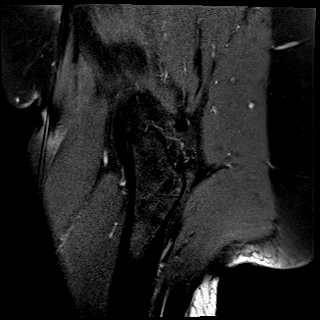
[im 18/24]
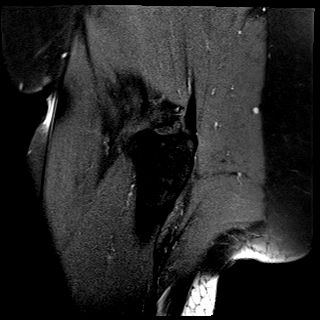
[im 21/24]
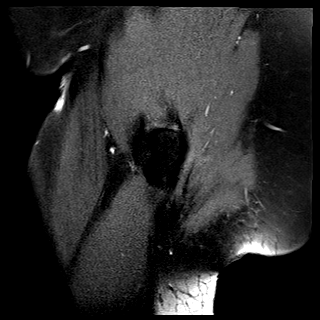
[im 24/24]
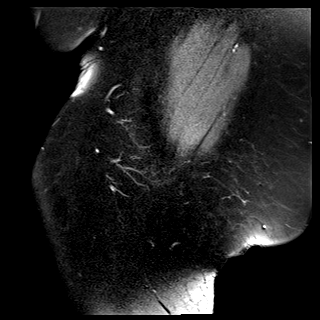

[36 of 40 positions shown; findings below may reference images not displayed]

FINDINGS: Bones: There is no evidence of acute fracture, dislocation or
avascular necrosis. Prior left hip total arthroplasty. The
visualized sacroiliac joints and symphysis pubis appear normal.
Right os acetabuli.

Articular cartilage and labrum

Articular cartilage: Mild thinning of the femoral head and
acetabular cartilage. No focal chondral defect or subchondral signal
abnormality identified.

Labrum: Probable degenerative tearing of the anterior superior
labrum.

Joint or bursal effusion

Joint effusion: No significant hip joint effusion.

Bursae: No focal periarticular fluid collection.

Muscles and tendons

Muscles and tendons: The visualized right gluteus, and bilateral
hamstring and iliopsoas tendons appear normal. The left gluteus
tendons are not well evaluated due to susceptibility artifact. The
left piriformis muscle is atrophied.

Other findings

Miscellaneous: Prior hysterectomy. The visualized internal pelvic
contents otherwise appear unremarkable.
IMPRESSION: 1. No evidence of greater trochanteric bursitis. No significant
right gluteal tendon pathology.
2. Mild right hip joint degenerative changes. No acute osseous
abnormality.

## 2017-02-13 ENCOUNTER — Ambulatory Visit: Payer: BLUE CROSS/BLUE SHIELD | Admitting: Physical Therapy

## 2017-02-13 DIAGNOSIS — M25551 Pain in right hip: Secondary | ICD-10-CM

## 2017-02-13 DIAGNOSIS — R531 Weakness: Secondary | ICD-10-CM | POA: Diagnosis not present

## 2017-02-13 DIAGNOSIS — R29898 Other symptoms and signs involving the musculoskeletal system: Secondary | ICD-10-CM

## 2017-02-13 DIAGNOSIS — M25552 Pain in left hip: Secondary | ICD-10-CM

## 2017-02-13 NOTE — Therapy (Signed)
[facility] [place], [place], [address] Phone: [phone]   Fax:  [phone]  Physical Therapy Treatment  Patient Details  Name: Leslie Roberson MRN: [id number] Date of Birth: 1956-01-16 Referring Provider: Dr. Georgina Snell    Encounter Date: 02/13/2017  PT End of Session - 02/13/17 1407    Visit Number  7    Number of Visits  12    Date for PT Re-Evaluation  03/04/17    PT Start Time  8588    PT Stop Time  1456    PT Time Calculation (min)  53 min    Activity Tolerance  Patient tolerated treatment well    Behavior During Therapy  [facility] for tasks assessed/performed       Past Medical History:  Diagnosis Date  . Anxiety   . Asthma, chronic 11/27/2015  . Depression   . Diverticulitis of colon 12/21/2015   Colonoscopy every 5 years/digestive health.   . GERD (gastroesophageal reflux disease) 11/27/2015  . Idiopathic scoliosis 01/22/2016  . Osteoporosis 11/27/2015   On prolia.   . Thyroid disease     Past Surgical History:  Procedure Laterality Date  . ABDOMINAL HYSTERECTOMY    . bladder mesh    . ELBOW SURGERY Right   . SPINAL FUSION    . TONSILLECTOMY    . TOTAL HIP ARTHROPLASTY      There were no vitals filed for this visit.  Subjective Assessment - 02/13/17 1407    Subjective  Pt reports she has had no pain in Rt with sleeping since last visit.  Lt hip still hurts when she walks any distance.  She is scheduled to see Dr. Mayer Camel for her hips next Tuesday.     Pertinent History  Torn meniscus Rt knee; trapezius strain; scoliosis; Lt THA several years ago    Currently in Pain?  No/denies    Pain Score  0-No pain 1/10 with sleep last few nights.          [facility] PT Assessment - 02/13/17 0001      Assessment   Medical Diagnosis  Rt trochanteric bursitis; trapezius strain     Referring Provider  Dr. Georgina Snell     Onset Date/Surgical Date  11/12/16    Hand Dominance  Right    Next MD Visit  PRN       Strength   Right Hip Extension  -- 5-/5    Right Hip ABduction  -- 5-/5    Left Hip Extension  5/5    Left Hip ABduction  5/5       [facility] Adult PT Treatment/Exercise - 02/13/17 0001      Knee/Hip Exercises: Stretches   Passive Hamstring Stretch  Right;Left;2 reps;30 seconds    Piriformis Stretch  Right;Left;30 seconds;3 reps    Other Knee/Hip Stretches  ITB stretch in supine with strap x 30 sec x 2 reps each leg.       Moist Heat Therapy   Number Minutes Moist Heat  20 Minutes    Moist Heat Location  Lumbar Spine;Hip bilat applied in supine      Electrical Stimulation   Electrical Stimulation Location  Rt adductor/ Rt lateral hip     Electrical Stimulation Action  premod to each area    Electrical Stimulation Parameters  to tolerance     Electrical Stimulation Goals  Tone;Pain      Manual Therapy   Manual therapy comments  pt prone and supine  Soft tissue mobilization  deep tissue work anterior Rt hip through hip flexors/adductors; bilat posterior hips sacrum to posterior greater trochanter Rt > Lt; Rt anterior lateral hip musculature    Myofascial Release  bilat posterior hips     Passive ROM  PROM IR/ER; abduction RLE    Manual Traction  through extended leg         PT Long Term Goals - 02/13/17 1410      PT LONG TERM GOAL #1   Title  independent with HEP (03/05/17)    Time  6    Period  Weeks    Status  On-going      PT LONG TERM GOAL #2   Title  improve L hip abduction strength to 4+/5 for improved activity tolerance (03/05/17)    Time  6    Period  Weeks    Status  On-going      PT LONG TERM GOAL #3   Title  report ability to walk > 15 min without increase in pain for improved activity tolerance and decreased pain (03/05/17)    Time  6    Period  Weeks    Status  On-going limited; only able to walk 10 min       PT LONG TERM GOAL #4   Title  improve FOTO to </= 41% limited (03/05/17)    Time  6    Period  Weeks    Status  On-going      PT LONG TERM  GOAL #5   Title  decrease pain in Rt hip by 50-75% allowing patient to sleep on Rt side for 3-4 hours at night (03/05/17)    Time  6    Period  Weeks    Status  Achieved            Plan - 02/13/17 1610    Clinical Impression Statement  Continued good response to manual work; last session had longer lasting results.  Pt has been able to sleep through the night 4 days in a row without waking due to pain.  Hip strength has improved; she has met LTG #2.  Progressing towards remaining goals.     Rehab Potential  Good    PT Frequency  2x / week    PT Duration  6 weeks    PT Treatment/Interventions  Patient/family education;ADLs/Self Care Home Management;Cryotherapy;Electrical Stimulation;Iontophoresis 96m/ml Dexamethasone;Moist Heat;Ultrasound;Dry needling;Manual techniques;Therapeutic activities;Therapeutic exercise;Neuromuscular re-education    PT Next Visit Plan  DN/manual work bilat hips; modalities as indicated     Consulted and Agree with Plan of Care  Patient       Patient will benefit from skilled therapeutic intervention in order to improve the following deficits and impairments:  Postural dysfunction, Improper body mechanics, Pain, Increased fascial restricitons, Increased muscle spasms, Decreased strength, Decreased mobility, Decreased range of motion, Decreased activity tolerance  Visit Diagnosis: Pain of right hip joint  Other symptoms and signs involving the musculoskeletal system  Weakness generalized  Pain of left hip joint     Problem List Patient Active Problem List   Diagnosis Date Noted  . Right leg pain 02/06/2017  . Chronic tension-type headache, intractable 02/05/2017  . Prediabetes 02/05/2017  . Lump in throat 01/27/2017  . Dysphagia 01/27/2017  . No energy 09/15/2016  . Bad taste in mouth 07/26/2016  . Paresthesia 04/03/2016  . Facet hypertrophy of lumbosacral region 04/03/2016  . Chronic back pain 04/03/2016  . Right knee pain 02/19/2016  .  Rosacea  02/04/2016  . Vaginal atrophy 01/31/2016  . Idiopathic scoliosis 01/22/2016  . Hip bursitis 12/25/2015  . Biceps tendonitis on right 12/25/2015  . Diverticulitis of colon 12/21/2015  . GERD (gastroesophageal reflux disease) 11/27/2015  . Osteoporosis 11/27/2015  . GAD (generalized anxiety disorder) 11/27/2015  . MDD (major depressive disorder), recurrent, in full remission ([place]) 11/27/2015  . Chronic dryness of both eyes 11/27/2015  . Hypothyroidism 11/27/2015  . Asthma, chronic 11/27/2015  . Insomnia 11/27/2015  . Seborrheic keratoses 11/27/2015   Kerin Perna, PTA 02/13/17 4:17 PM  [facility] [place], [place], [address] Phone: [phone]   Fax:  [phone]  Name: Leslie Roberson MRN: [id number] Date of Birth: 1955-07-09

## 2017-02-13 NOTE — Telephone Encounter (Signed)
Have talked and recommended to increase lexapro to 5mg  2 tablets. klonopine to 0,.5mg  bid. Give medications some time to build and work on daytime activities to keep busy

## 2017-02-13 NOTE — Telephone Encounter (Signed)
Per pt- the anxiety is right back to where it was and the increase is not helping. She only wants to speak to dr De Nurse. Pt refuse to talk to nurse.   Her cb # [phone]

## 2017-02-15 ENCOUNTER — Other Ambulatory Visit (HOSPITAL_COMMUNITY): Payer: Self-pay | Admitting: Psychiatry

## 2017-02-16 ENCOUNTER — Other Ambulatory Visit (HOSPITAL_COMMUNITY): Payer: Self-pay | Admitting: Psychiatry

## 2017-02-16 ENCOUNTER — Encounter: Payer: Self-pay | Admitting: Rehabilitative and Restorative Service Providers"

## 2017-02-16 ENCOUNTER — Ambulatory Visit: Payer: BLUE CROSS/BLUE SHIELD | Admitting: Rehabilitative and Restorative Service Providers"

## 2017-02-16 ENCOUNTER — Other Ambulatory Visit (HOSPITAL_COMMUNITY): Payer: Self-pay

## 2017-02-16 DIAGNOSIS — R531 Weakness: Secondary | ICD-10-CM

## 2017-02-16 DIAGNOSIS — R29898 Other symptoms and signs involving the musculoskeletal system: Secondary | ICD-10-CM

## 2017-02-16 DIAGNOSIS — M25551 Pain in right hip: Secondary | ICD-10-CM

## 2017-02-16 MED ORDER — ESCITALOPRAM OXALATE 10 MG PO TABS: 10.0000 mg | ORAL_TABLET | Freq: Every day | ORAL | 0 refills | Status: DC

## 2017-02-16 MED ORDER — ESCITALOPRAM OXALATE 5 MG PO TABS: 10.0000 mg | ORAL_TABLET | Freq: Every day | ORAL | 0 refills | Status: DC

## 2017-02-16 NOTE — Patient Instructions (Signed)
TENS UNIT: This is helpful for muscle pain and spasm.   Search and Purchase a TENS 7000 2nd edition at www.tenspros.com. It should be less than $30.     TENS unit instructions: Do not shower or bathe with the unit on Turn the unit off before removing electrodes or batteries If the electrodes lose stickiness add a drop of water to the electrodes after they are disconnected from the unit and place on plastic sheet. If you continued to have difficulty, call the TENS unit company to purchase more electrodes. Do not apply lotion on the skin area prior to use. Make sure the skin is clean and dry as this will help prolong the life of the electrodes. After use, always check skin for unusual red areas, rash or other skin difficulties. If there are any skin problems, does not apply electrodes to the same area. Never remove the electrodes from the unit by pulling the wires. Do not use the TENS unit or electrodes other than as directed. Do not change electrode placement without consultating your therapist or physician. Keep 2 fingers with between each electrode.    

## 2017-02-16 NOTE — Telephone Encounter (Signed)
Patient needs refill of lexapro 10mg  sent to [facility] on file.  Please advise.

## 2017-02-16 NOTE — Therapy (Signed)
[place], [place], [address] Phone: [phone]   Fax:  [phone]  Physical Therapy Treatment  Patient Details  Name: Leslie Roberson MRN: [id number] Date of Birth: December 10, 1955 Referring Provider: Dr Georgina Snell    Encounter Date: 02/16/2017  PT End of Session - 02/16/17 1023    Visit Number  8    Number of Visits  12    Date for PT Re-Evaluation  03/04/17    PT Start Time  7517    PT Stop Time  1111    PT Time Calculation (min)  56 min    Activity Tolerance  Patient tolerated treatment well       Past Medical History:  Diagnosis Date  . Anxiety   . Asthma, chronic 11/27/2015  . Depression   . Diverticulitis of colon 12/21/2015   Colonoscopy every 5 years/digestive health.   . GERD (gastroesophageal reflux disease) 11/27/2015  . Idiopathic scoliosis 01/22/2016  . Osteoporosis 11/27/2015   On prolia.   . Thyroid disease     Past Surgical History:  Procedure Laterality Date  . ABDOMINAL HYSTERECTOMY    . bladder mesh    . ELBOW SURGERY Right   . SPINAL FUSION    . TONSILLECTOMY    . TOTAL HIP ARTHROPLASTY      There were no vitals filed for this visit.  Subjective Assessment - 02/16/17 1100    Subjective  Good improvement with decreased pain and improved sleep. She continues to awaken during the night with pain but it is less intense and she awakes less frequently.     Pertinent History  Torn meniscus Rt knee; trapezius strain; scoliosis; Lt THA several years ago    How long can you sit comfortably?  no problem     How long can you stand comfortably?  back pain in ~10 min     How long can you walk comfortably?  Lt hip pain in ~ 3-5 min     Diagnostic tests  xrays     Patient Stated Goals  improve pain in the Rt hip/back so she can lie on the Rt side to sleep     Currently in Pain?  No/denies    Pain Score  -- 2/10 at night - down from 8+/10 at initial evaluation     Pain Location   Hip    Pain Orientation  Right    Pain Descriptors / Indicators  Aching;Tightness;Sore    Pain Type  Acute pain    Pain Radiating Towards  lateral thigh     Pain Onset  More than a month ago    Pain Frequency  Intermittent    Aggravating Factors   lying on Rt side     Pain Relieving Factors  staying off Rt side; therapy; topical medication          [facility] PT Assessment - 02/16/17 0001      Assessment   Medical Diagnosis  Rt trochanteric bursitis; trapezius strain     Referring Provider  Dr Georgina Snell     Onset Date/Surgical Date  11/12/16    Hand Dominance  Right    Next MD Visit  PRN      Posture/Postural Control   Posture Comments  scoliosis Rt convex thoracic curve; Lt concave; Rt convex lumbar curve with rotation of trunk       Strength   Right Hip Extension  -- 5-/5    Right Hip ABduction  --  5-/5    Left Hip Extension  5/5    Left Hip ABduction  5/5      Flexibility   Hamstrings  85-90 deg bilat     Quadriceps  tight bilat     ITB  WFL's bilat     Piriformis  tight Rt > Lt       Palpation   Palpation comment  tight Rt sacral border to posterior greater trochanter; Lt posterior greater trochanter                   [facility] Adult PT Treatment/Exercise - 02/16/17 0001      Knee/Hip Exercises: Stretches   Passive Hamstring Stretch  Right;Left;2 reps;30 seconds    Piriformis Stretch  Right;Left;30 seconds;3 reps    Other Knee/Hip Stretches  ITB stretch in supine with strap x 30 sec x 2 reps each leg.       Moist Heat Therapy   Number Minutes Moist Heat  20 Minutes    Moist Heat Location  Lumbar Spine;Hip bilat applied in supine      Electrical Stimulation   Electrical Stimulation Location  Rt adductor/ Rt lateral hip     Electrical Stimulation Action  IFC    Electrical Stimulation Parameters  to tolerance    Electrical Stimulation Goals  Tone;Pain      Manual Therapy   Manual therapy comments  pt prone and supine     Soft tissue mobilization  deep tissue  work anterior Rt hip through hip flexors/adductors; bilat posterior hips sacrum to posterior greater trochanter Rt > Lt; Rt anterior lateral hip musculature    Myofascial Release  bilat posterior hips     Passive ROM  PROM IR/ER; abduction RLE    Manual Traction  through extended leg              PT Education - 02/16/17 1051    Education provided  Yes    Education Details  myofacial ball release work anterior hip/pelvis prone; hip abductors/adductors standing; buttocks standing. TENS     Person(s) Educated  Patient    Methods  Explanation    Comprehension  Verbalized understanding          PT Long Term Goals - 02/13/17 1410      PT LONG TERM GOAL #1   Title  independent with HEP (03/05/17)    Time  6    Period  Weeks    Status  On-going      PT LONG TERM GOAL #2   Title  improve L hip abduction strength to 4+/5 for improved activity tolerance (03/05/17)    Time  6    Period  Weeks    Status  On-going      PT LONG TERM GOAL #3   Title  report ability to walk > 15 min without increase in pain for improved activity tolerance and decreased pain (03/05/17)    Time  6    Period  Weeks    Status  On-going limited; only able to walk 10 min       PT LONG TERM GOAL #4   Title  improve FOTO to </= 41% limited (03/05/17)    Time  6    Period  Weeks    Status  On-going      PT LONG TERM GOAL #5   Title  decrease pain in Rt hip by 50-75% allowing patient to sleep on Rt side for 3-4 hours at night (03/05/17)  Time  6    Period  Weeks    Status  Achieved            Plan - 02/16/17 1058    Clinical Impression Statement  Improving strength bilat Rt hip. Decreased tightness and tenderness to palpation. Decreased pain with improved sleep reported. Progressing well toward stated goals of therapy. Will benefit from continued treatment to further resolve Rt hip pain and improve sleep thus improving health and functional activity level.     Rehab Potential  Good    PT  Frequency  2x / week    PT Duration  6 weeks    PT Treatment/Interventions  Patient/family education;ADLs/Self Care Home Management;Cryotherapy;Electrical Stimulation;Iontophoresis 4mg /ml Dexamethasone;Moist Heat;Ultrasound;Dry needling;Manual techniques;Therapeutic activities;Therapeutic exercise;Neuromuscular re-education    PT Next Visit Plan  DN/manual work bilat hips; modalities as indicated     Consulted and Agree with Plan of Care  Patient       Patient will benefit from skilled therapeutic intervention in order to improve the following deficits and impairments:  Postural dysfunction, Improper body mechanics, Pain, Increased fascial restricitons, Increased muscle spasms, Decreased strength, Decreased mobility, Decreased range of motion, Decreased activity tolerance  Visit Diagnosis: Pain of right hip joint  Other symptoms and signs involving the musculoskeletal system  Weakness generalized     Problem List Patient Active Problem List   Diagnosis Date Noted  . Right leg pain 02/06/2017  . Chronic tension-type headache, intractable 02/05/2017  . Prediabetes 02/05/2017  . Lump in throat 01/27/2017  . Dysphagia 01/27/2017  . No energy 09/15/2016  . Bad taste in mouth 07/26/2016  . Paresthesia 04/03/2016  . Facet hypertrophy of lumbosacral region 04/03/2016  . Chronic back pain 04/03/2016  . Right knee pain 02/19/2016  . Rosacea 02/04/2016  . Vaginal atrophy 01/31/2016  . Idiopathic scoliosis 01/22/2016  . Hip bursitis 12/25/2015  . Biceps tendonitis on right 12/25/2015  . Diverticulitis of colon 12/21/2015  . GERD (gastroesophageal reflux disease) 11/27/2015  . Osteoporosis 11/27/2015  . GAD (generalized anxiety disorder) 11/27/2015  . MDD (major depressive disorder), recurrent, in full remission ([place]) 11/27/2015  . Chronic dryness of both eyes 11/27/2015  . Hypothyroidism 11/27/2015  . Asthma, chronic 11/27/2015  . Insomnia 11/27/2015  . Seborrheic keratoses  11/27/2015    Eilam Shrewsbury Nilda Simmer PT, MPH 02/16/2017, 11:37 AM  [facility] [place], [place], [address] Phone: [phone]   Fax:  [phone]  Name: Leslie Roberson MRN: [id number] Date of Birth: 08-10-1955

## 2017-02-16 NOTE — Progress Notes (Signed)
Dr. De Nurse increased patients' Lexapro to 5mg  2 tabs daily. Insurance would not pay for that dosage. Sent in Lexapro 10mg  1 daily as requested by pharmacy.

## 2017-02-16 NOTE — Telephone Encounter (Signed)
Dr. De Nurse increased Lexapro to 2 5mg  tabs per day on 02/11/17. Patient is about to run out. Sent refill to pharmacy.

## 2017-02-17 DIAGNOSIS — Z86010 Personal history of colonic polyps: Secondary | ICD-10-CM | POA: Diagnosis not present

## 2017-02-17 DIAGNOSIS — K219 Gastro-esophageal reflux disease without esophagitis: Secondary | ICD-10-CM | POA: Diagnosis not present

## 2017-02-17 DIAGNOSIS — K224 Dyskinesia of esophagus: Secondary | ICD-10-CM | POA: Diagnosis not present

## 2017-02-17 DIAGNOSIS — M25552 Pain in left hip: Secondary | ICD-10-CM | POA: Diagnosis not present

## 2017-02-17 DIAGNOSIS — R1310 Dysphagia, unspecified: Secondary | ICD-10-CM | POA: Diagnosis not present

## 2017-02-24 ENCOUNTER — Encounter: Payer: Self-pay | Admitting: Rehabilitative and Restorative Service Providers"

## 2017-02-24 ENCOUNTER — Ambulatory Visit (INDEPENDENT_AMBULATORY_CARE_PROVIDER_SITE_OTHER): Payer: BLUE CROSS/BLUE SHIELD | Admitting: Rehabilitative and Restorative Service Providers"

## 2017-02-24 ENCOUNTER — Ambulatory Visit (HOSPITAL_COMMUNITY): Payer: Self-pay | Admitting: Licensed Clinical Social Worker

## 2017-02-24 DIAGNOSIS — R29898 Other symptoms and signs involving the musculoskeletal system: Secondary | ICD-10-CM

## 2017-02-24 DIAGNOSIS — M25552 Pain in left hip: Secondary | ICD-10-CM | POA: Diagnosis not present

## 2017-02-24 DIAGNOSIS — R531 Weakness: Secondary | ICD-10-CM

## 2017-02-24 DIAGNOSIS — M25551 Pain in right hip: Secondary | ICD-10-CM | POA: Diagnosis not present

## 2017-02-24 NOTE — Therapy (Signed)
[place], [place], [address] Phone: [phone]   Fax:  [phone]  Physical Therapy Treatment  Patient Details  Name: Leslie Roberson MRN: [id number] Date of Birth: [birth year] Referring Provider: Dr Georgina Snell    Encounter Date: 02/24/2017  PT End of Session - 02/24/17 1105    Visit Number  9    Number of Visits  12    Date for PT Re-Evaluation  03/04/17    PT Start Time  1104    PT Stop Time  1200    PT Time Calculation (min)  56 min    Activity Tolerance  Patient tolerated treatment well       Past Medical History:  Diagnosis Date  . Anxiety   . Asthma, chronic 11/27/2015  . Depression   . Diverticulitis of colon 12/21/2015   Colonoscopy every 5 years/digestive health.   . GERD (gastroesophageal reflux disease) 11/27/2015  . Idiopathic scoliosis 01/22/2016  . Osteoporosis 11/27/2015   On prolia.   . Thyroid disease     Past Surgical History:  Procedure Laterality Date  . ABDOMINAL HYSTERECTOMY    . bladder mesh    . ELBOW SURGERY Right   . SPINAL FUSION    . TONSILLECTOMY    . TOTAL HIP ARTHROPLASTY      There were no vitals filed for this visit.  Subjective Assessment - 02/24/17 1106    Subjective  Good improvement with decreased pain and improved sleep. She continues to awaken during the night with pain but it is less intense and she awakes less frequently. Did well on the trip and slept better than at home.     Currently in Pain?  No/denies    Pain Score  -- 7/10 when lying on the Rt hip     Pain Location  Hip    Pain Orientation  Right    Pain Descriptors / Indicators  Aching;Tightness;Sore    Pain Type  Acute pain    Pain Onset  More than a month ago    Pain Frequency  Intermittent    Aggravating Factors   lying on Rt side                       [facility] Adult PT Treatment/Exercise - 02/24/17 0001      Knee/Hip Exercises: Stretches   Passive Hamstring  Stretch  Right;Left;2 reps;30 seconds    Piriformis Stretch  Right;Left;30 seconds;3 reps    Other Knee/Hip Stretches  ITB stretch in supine with strap x 30 sec x 2 reps each leg.       Moist Heat Therapy   Number Minutes Moist Heat  20 Minutes    Moist Heat Location  Lumbar Spine;Hip bilat applied in supine      Electrical Stimulation   Electrical Stimulation Location  Rt adductor/ Rt lateral hip     Electrical Stimulation Action  IFC    Electrical Stimulation Parameters  to tolerance    Electrical Stimulation Goals  Tone;Pain      Ultrasound   Ultrasound Location  Rt posterior hip     Ultrasound Parameters  1.5 w/cm2; 1 mHz; 100%; 8 min     Ultrasound Goals  Pain;Other (Comment)      Manual Therapy   Manual therapy comments  pt prone and supine     Soft tissue mobilization  deep tissue work anterior Rt hip through hip flexors/adductors; bilat posterior hips sacrum to  posterior greater trochanter Rt > Lt; Rt anterior lateral hip musculature    Myofascial Release  bilat posterior hips     Passive ROM  PROM IR/ER; abduction RLE    Manual Traction  through extended leg                   PT Long Term Goals - 02/13/17 1410      PT LONG TERM GOAL #1   Title  independent with HEP (03/05/17)    Time  6    Period  Weeks    Status  On-going      PT LONG TERM GOAL #2   Title  improve L hip abduction strength to 4+/5 for improved activity tolerance (03/05/17)    Time  6    Period  Weeks    Status  On-going      PT LONG TERM GOAL #3   Title  report ability to walk > 15 min without increase in pain for improved activity tolerance and decreased pain (03/05/17)    Time  6    Period  Weeks    Status  On-going limited; only able to walk 10 min       PT LONG TERM GOAL #4   Title  improve FOTO to </= 41% limited (03/05/17)    Time  6    Period  Weeks    Status  On-going      PT LONG TERM GOAL #5   Title  decrease pain in Rt hip by 50-75% allowing patient to sleep on Rt side  for 3-4 hours at night (03/05/17)    Time  6    Period  Weeks    Status  Achieved            Plan - 02/24/17 1215    Clinical Impression Statement  Flare up of symptoms last night which may be related to increased activity. She did well during her travels and is sleeping better - except last night. Added [place] to posterior hip and continued manual work Rt LE.     Rehab Potential  Good    PT Frequency  2x / week    PT Duration  6 weeks    PT Treatment/Interventions  Patient/family education;ADLs/Self Care Home Management;Cryotherapy;Electrical Stimulation;Iontophoresis 4mg /ml Dexamethasone;Moist Heat;Ultrasound;Dry needling;Manual techniques;Therapeutic activities;Therapeutic exercise;Neuromuscular re-education    PT Next Visit Plan  DN/manual work bilat hips; modalities as indicated assess response to [place]     Consulted and Agree with Plan of Care  Patient       Patient will benefit from skilled therapeutic intervention in order to improve the following deficits and impairments:  Postural dysfunction, Improper body mechanics, Pain, Increased fascial restricitons, Increased muscle spasms, Decreased strength, Decreased mobility, Decreased range of motion, Decreased activity tolerance  Visit Diagnosis: Pain of right hip joint  Other symptoms and signs involving the musculoskeletal system  Weakness generalized  Pain of left hip joint     Problem List Patient Active Problem List   Diagnosis Date Noted  . Right leg pain 02/06/2017  . Chronic tension-type headache, intractable 02/05/2017  . Prediabetes 02/05/2017  . Lump in throat 01/27/2017  . Dysphagia 01/27/2017  . No energy 09/15/2016  . Bad taste in mouth 07/26/2016  . Paresthesia 04/03/2016  . Facet hypertrophy of lumbosacral region 04/03/2016  . Chronic back pain 04/03/2016  . Right knee pain 02/19/2016  . Rosacea 02/04/2016  . Vaginal atrophy 01/31/2016  . Idiopathic scoliosis 01/22/2016  . Hip  bursitis 12/25/2015  .  Biceps tendonitis on right 12/25/2015  . Diverticulitis of colon 12/21/2015  . GERD (gastroesophageal reflux disease) 11/27/2015  . Osteoporosis 11/27/2015  . GAD (generalized anxiety disorder) 11/27/2015  . MDD (major depressive disorder), recurrent, in full remission ([place]) 11/27/2015  . Chronic dryness of both eyes 11/27/2015  . Hypothyroidism 11/27/2015  . Asthma, chronic 11/27/2015  . Insomnia 11/27/2015  . Seborrheic keratoses 11/27/2015    Valera Vallas Nilda Simmer PT, MPH  02/24/2017, 12:18 PM  [facility] [place], [place], [address] Phone: [phone]   Fax:  [phone]  Name: Leslie Roberson MRN: [id number] Date of Birth: May 22, 1955

## 2017-02-25 ENCOUNTER — Encounter: Payer: Self-pay | Admitting: Physician Assistant

## 2017-02-25 ENCOUNTER — Ambulatory Visit (INDEPENDENT_AMBULATORY_CARE_PROVIDER_SITE_OTHER): Payer: BLUE CROSS/BLUE SHIELD | Admitting: Psychiatry

## 2017-02-25 ENCOUNTER — Encounter (HOSPITAL_COMMUNITY): Payer: Self-pay | Admitting: Psychiatry

## 2017-02-25 ENCOUNTER — Ambulatory Visit (INDEPENDENT_AMBULATORY_CARE_PROVIDER_SITE_OTHER): Payer: BLUE CROSS/BLUE SHIELD | Admitting: Physician Assistant

## 2017-02-25 VITALS — BP 134/78 | HR 94 | Ht <= 58 in | Wt 126.0 lb

## 2017-02-25 VITALS — BP 110/72 | HR 95 | Ht <= 58 in | Wt 125.8 lb

## 2017-02-25 DIAGNOSIS — L600 Ingrowing nail: Secondary | ICD-10-CM

## 2017-02-25 DIAGNOSIS — F411 Generalized anxiety disorder: Secondary | ICD-10-CM

## 2017-02-25 DIAGNOSIS — E039 Hypothyroidism, unspecified: Secondary | ICD-10-CM | POA: Diagnosis not present

## 2017-02-25 DIAGNOSIS — M810 Age-related osteoporosis without current pathological fracture: Secondary | ICD-10-CM | POA: Diagnosis not present

## 2017-02-25 DIAGNOSIS — F5102 Adjustment insomnia: Secondary | ICD-10-CM | POA: Diagnosis not present

## 2017-02-25 DIAGNOSIS — F3342 Major depressive disorder, recurrent, in full remission: Secondary | ICD-10-CM | POA: Diagnosis not present

## 2017-02-25 DIAGNOSIS — F331 Major depressive disorder, recurrent, moderate: Secondary | ICD-10-CM | POA: Diagnosis not present

## 2017-02-25 DIAGNOSIS — Z0000 Encounter for general adult medical examination without abnormal findings: Secondary | ICD-10-CM | POA: Diagnosis not present

## 2017-02-25 MED ORDER — ESCITALOPRAM OXALATE 10 MG PO TABS: 10.0000 mg | ORAL_TABLET | Freq: Every day | ORAL | 1 refills | Status: DC

## 2017-02-25 MED ORDER — CLONAZEPAM 0.5 MG PO TABS: 0.5000 mg | ORAL_TABLET | Freq: Two times a day (BID) | ORAL | 1 refills | Status: DC | PRN

## 2017-02-25 NOTE — Patient Instructions (Signed)
Keeping You Healthy  Get These Tests  Blood Pressure- Have your blood pressure checked by your healthcare provider at least once a year.  Normal blood pressure is 120/80.  Weight- Have your body mass index (BMI) calculated to screen for obesity.  BMI is a measure of body fat based on height and weight.  You can calculate your own BMI at www.nhlbisupport.com/bmi/  Cholesterol- Have your cholesterol checked every year.  Diabetes- Have your blood sugar checked every year if you have high blood pressure, high cholesterol, a family history of diabetes or if you are overweight.  Pap Test - Have a pap test every 1 to 5 years if you have been sexually active.  If you are older than 65 and recent pap tests have been normal you may not need additional pap tests.  In addition, if you have had a hysterectomy  for benign disease additional pap tests are not necessary.  Mammogram-Yearly mammograms are essential for early detection of breast cancer  Screening for Colon Cancer- Colonoscopy starting at age 50. Screening may begin sooner depending on your family history and other health conditions.  Follow up colonoscopy as directed by your Gastroenterologist.  Screening for Osteoporosis- Screening begins at age 65 with bone density scanning, sooner if you are at higher risk for developing Osteoporosis.  Get these medicines  Calcium with Vitamin D- Your body requires 1200-1500 mg of Calcium a day and 800-1000 IU of Vitamin D a day.  You can only absorb 500 mg of Calcium at a time therefore Calcium must be taken in 2 or 3 separate doses throughout the day.  Hormones- Hormone therapy has been associated with increased risk for certain cancers and heart disease.  Talk to your healthcare provider about if you need relief from menopausal symptoms.  Aspirin- Ask your healthcare provider about taking Aspirin to prevent Heart Disease and Stroke.  Get these Immuniztions  Flu shot- Every fall  Pneumonia shot-  Once after the age of 65; if you are younger ask your healthcare provider if you need a pneumonia shot.  Tetanus- Every ten years.  Zostavax- Once after the age of 60 to prevent shingles.  Take these steps  Don't smoke- Your healthcare provider can help you quit. For tips on how to quit, ask your healthcare provider or go to www.smokefree.gov or call 1-800 QUIT-NOW.  Be physically active- Exercise 5 days a week for a minimum of 30 minutes.  If you are not already physically active, start slow and gradually work up to 30 minutes of moderate physical activity.  Try walking, dancing, bike riding, swimming, etc.  Eat a healthy diet- Eat a variety of healthy foods such as fruits, vegetables, whole grains, low fat milk, low fat cheeses, yogurt, lean meats, chicken, fish, eggs, dried beans, tofu, etc.  For more information go to www.thenutritionsource.org  Dental visit- Brush and floss teeth twice daily; visit your dentist twice a year.  Eye exam- Visit your Optometrist or Ophthalmologist yearly.  Drink alcohol in moderation- Limit alcohol intake to one drink or less a day.  Never drink and drive.  Depression- Your emotional health is as important as your physical health.  If you're feeling down or losing interest in things you normally enjoy, please talk to your healthcare provider.  Seat Belts- can save your life; always wear one  Smoke/Carbon Monoxide detectors- These detectors need to be installed on the appropriate level of your home.  Replace batteries at least once a year.  Violence- If   anyone is threatening or hurting you, please tell your healthcare provider. Living Will/ Health care power of attorney- Discuss with your healthcare provider and family.Ingrown Toenail An ingrown toenail occurs when the corner or sides of your toenail grow into the surrounding skin. The big toe is most commonly affected, but it can happen to any of your toes. If your ingrown toenail is not treated, you  will be at risk for infection. What are the causes? This condition may be caused by:  Wearing shoes that are too small or tight.  Injury or trauma, such as stubbing your toe or having your toe stepped on.  Improper cutting or care of your toenails.  Being born with (congenital) nail or foot abnormalities, such as having a nail that is too big for your toe.  What increases the risk? Risk factors for an ingrown toenail include:  Age. Your nails tend to thicken as you get older, so ingrown nails are more common in older people.  Diabetes.  Cutting your toenails incorrectly.  Blood circulation problems.  What are the signs or symptoms? Symptoms may include:  Pain, soreness, or tenderness.  Redness.  Swelling.  Hardening of the skin surrounding the toe.  Your ingrown toenail may be infected if there is fluid, pus, or drainage. How is this diagnosed? An ingrown toenail may be diagnosed by medical history and physical exam. If your toenail is infected, your health care provider may test a sample of the drainage. How is this treated? Treatment depends on the severity of your ingrown toenail. Some ingrown toenails may be treated at home. More severe or infected ingrown toenails may require surgery to remove all or part of the nail. Infected ingrown toenails may also be treated with antibiotic medicines. Follow these instructions at home:  If you were prescribed an antibiotic medicine, finish all of it even if you start to feel better.  Soak your foot in warm soapy water for 20 minutes, 3 times per day or as directed by your health care provider.  Carefully lift the edge of the nail away from the sore skin by wedging a small piece of cotton under the corner of the nail. This may help with the pain. Be careful not to cause more injury to the area.  Wear shoes that fit well. If your ingrown toenail is causing you pain, try wearing sandals, if possible.  Trim your toenails  regularly and carefully. Do not cut them in a curved shape. Cut your toenails straight across. This prevents injury to the skin at the corners of the toenail.  Keep your feet clean and dry.  If you are having trouble walking and are given crutches by your health care provider, use them as directed.  Do not pick at your toenail or try to remove it yourself.  Take medicines only as directed by your health care provider.  Keep all follow-up visits as directed by your health care provider. This is important. Contact a health care provider if:  Your symptoms do not improve with treatment. Get help right away if:  You have red streaks that start at your foot and go up your leg.  You have a fever.  You have increased redness, swelling, or pain.  You have fluid, blood, or pus coming from your toenail. This information is not intended to replace advice given to you by your health care provider. Make sure you discuss any questions you have with your health care provider. Document Released: 03/14/2000 Document Revised:  08/17/2015 Document Reviewed: 02/08/2014 Elsevier Interactive Patient Education  [employer].
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
## 2017-02-25 NOTE — Progress Notes (Signed)
Subjective:     Leslie Roberson is a 61 y.o. female and is here for a comprehensive physical exam. The patient reports problems - she is having some pain laterally on left great toe. no redness, warmth, or swelling. tried to clip nail out as she has hx of ingrown toenails. .  Pt does have ongoing anxiety which she sees psychiatry for.   Social History   Socioeconomic History  . Marital status: Married    Spouse name: Not on file  . Number of children: Not on file  . Years of education: Not on file  . Highest education level: Not on file  Social Needs  . Financial resource strain: Not on file  . Food insecurity - worry: Not on file  . Food insecurity - inability: Not on file  . Transportation needs - medical: Not on file  . Transportation needs - non-medical: Not on file  Occupational History  . Not on file  Tobacco Use  . Smoking status: Never Smoker  . Smokeless tobacco: Never Used  Substance and Sexual Activity  . Alcohol use: Yes  . Drug use: No  . Sexual activity: Yes  Other Topics Concern  . Not on file  Social History Narrative  . Not on file   Health Maintenance  Topic Date Due  . HIV Screening  09/18/1970  . DEXA SCAN  12/25/2017  . MAMMOGRAM  01/01/2019  . PAP SMEAR  01/30/2019  . COLONOSCOPY  12/19/2020  . TETANUS/TDAP  01/29/2026  . INFLUENZA VACCINE  Completed  . Hepatitis C Screening  Completed    The following portions of the patient's history were reviewed and updated as appropriate: allergies, current medications, past family history, past medical history, past social history, past surgical history and problem list.  Review of Systems A comprehensive review of systems was negative.   Objective:    BP 134/78   Pulse 94   Ht 4' 9.01" (1.448 m)   Wt 126 lb (57.2 kg)   BMI 27.26 kg/m  General appearance: alert, cooperative and appears stated age Head: Normocephalic, without obvious abnormality, atraumatic Eyes: conjunctivae/corneas clear.  PERRL, EOM's intact. Fundi benign. Ears: normal TM's and external ear canals both ears Nose: Nares normal. Septum midline. Mucosa normal. No drainage or sinus tenderness. Throat: lips, mucosa, and tongue normal; teeth and gums normal Neck: no adenopathy, no carotid bruit, no JVD, supple, symmetrical, trachea midline and thyroid not enlarged, symmetric, no tenderness/mass/nodules Back: scolosis present.  Lungs: clear to auscultation bilaterally Heart: regular rate and rhythm, S1, S2 normal, no murmur, click, rub or gallop Abdomen: soft, non-tender; bowel sounds normal; no masses,  no organomegaly Extremities: extremities normal, atraumatic, no cyanosis or edema and pt did have some tenderness around left lateral great toe. no redness, warmth or pus.  Pulses: 2+ and symmetric Skin: Skin color, texture, turgor normal. No rashes or lesions Lymph nodes: Cervical, supraclavicular, and axillary nodes normal. Neurologic: Alert and oriented X 3, normal strength and tone. Normal symmetric reflexes. Normal coordination and gait    Assessment:    Healthy female exam.      Plan:  Marland KitchenMarland KitchenIvyana was seen today for annual exam.  Diagnoses and all orders for this visit:  Routine physical examination  MDD (major depressive disorder), recurrent, in full remission ([place])  GAD (generalized anxiety disorder)  Hypothyroidism, unspecified type  Osteoporosis, unspecified osteoporosis type, unspecified pathological fracture presence  Ingrown toenail of left foot     Patient has had a hysterectomy and  Pap not indicated. Mammogram and bone density are up-to-date. Colonoscopy is up-to-date. Patient has had all of her recommended vaccines.  Pt sees [facility] downstairs for management of mental health.   .. Discussed 150 minutes of exercise a week.  Encouraged vitamin D 1000 units and Calcium 1300mg  or 4 servings of dairy a day.  Pt does have osteoporosis.  She has been getting Prolia injections.  The last  Prolia injection was not covered and she is still battling with insurance company.  She is due for her next injection but concerned if she should get it would not.  I will contact Merleen Nicely to see if she can look into this.  I do think patient has the start of an ingrown toe in her left great toe.  I used a nail lifter and try to create some space where the nail would grow appropriately.  I encouraged warm water Epson salts for the next week or so.  If pain persists please make an appointment for toenail removal.  Discussed signs of infection with redness, swelling, pus.  If this occurs please let us know.  And I will send over antibiotic before we remove the toenail. See After Visit Summary for Counseling Recommendations

## 2017-02-26 ENCOUNTER — Ambulatory Visit (INDEPENDENT_AMBULATORY_CARE_PROVIDER_SITE_OTHER): Payer: BLUE CROSS/BLUE SHIELD | Admitting: Licensed Clinical Social Worker

## 2017-02-26 ENCOUNTER — Ambulatory Visit: Payer: Self-pay | Admitting: Family Medicine

## 2017-02-26 DIAGNOSIS — F331 Major depressive disorder, recurrent, moderate: Secondary | ICD-10-CM | POA: Diagnosis not present

## 2017-02-26 DIAGNOSIS — F411 Generalized anxiety disorder: Secondary | ICD-10-CM
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
## 2017-02-27 ENCOUNTER — Ambulatory Visit: Payer: BLUE CROSS/BLUE SHIELD | Admitting: Rehabilitative and Restorative Service Providers"

## 2017-02-27 ENCOUNTER — Encounter: Payer: Self-pay | Admitting: Physician Assistant

## 2017-02-27 ENCOUNTER — Encounter: Payer: Self-pay | Admitting: Rehabilitative and Restorative Service Providers"

## 2017-02-27 ENCOUNTER — Telehealth: Payer: Self-pay | Admitting: Physician Assistant

## 2017-02-27 DIAGNOSIS — R531 Weakness: Secondary | ICD-10-CM | POA: Diagnosis not present

## 2017-02-27 DIAGNOSIS — R29898 Other symptoms and signs involving the musculoskeletal system: Secondary | ICD-10-CM

## 2017-02-27 DIAGNOSIS — M25551 Pain in right hip: Secondary | ICD-10-CM

## 2017-02-27 NOTE — Telephone Encounter (Signed)
Reminder if you could see what is going on with them not wanting to pay for prolia. She is due for her next injection but concerned to get it since they have not yet paid for this injection.  Thanks,

## 2017-02-27 NOTE — Therapy (Signed)
[place], [place], [address] Phone: [phone]   Fax:  [phone]  Physical Therapy Treatment  Patient Details  Name: Leslie Roberson MRN: [id number] Date of Birth: 1955-10-29 Referring Provider: Dr Georgina Snell    Encounter Date: 02/27/2017  PT End of Session - 02/27/17 1147    Visit Number  10  (Pended)     Number of Visits  12  (Pended)     Date for PT Re-Evaluation  03/04/17  (Pended)     PT Start Time  6962  (Pended)     PT Stop Time  1243  (Pended)     PT Time Calculation (min)  58 min  (Pended)     Activity Tolerance  Patient tolerated treatment well  (Pended)        Past Medical History:  Diagnosis Date  . Anxiety   . Asthma, chronic 11/27/2015  . Depression   . Diverticulitis of colon 12/21/2015   Colonoscopy every 5 years/digestive health.   . GERD (gastroesophageal reflux disease) 11/27/2015  . Idiopathic scoliosis 01/22/2016  . Osteoporosis 11/27/2015   On prolia.   . Thyroid disease     Past Surgical History:  Procedure Laterality Date  . ABDOMINAL HYSTERECTOMY    . bladder mesh    . ELBOW SURGERY Right   . SPINAL FUSION    . TONSILLECTOMY    . TOTAL HIP ARTHROPLASTY      There were no vitals filed for this visit.  Subjective Assessment - 02/27/17 1248    Subjective  Still flared up this week and having difficulty sleeping in the bed again. She has slept some in the recliner again - was sleeping in the bed all night. Slept until about 5:30 this morning. This is better than it was when she first came to PT but not as good as last week. Not sure if the line dancing irritated symptoms but does not know of anything else that she did that was different.     Currently in Pain?  No/denies    Pain Score  -- 6-7/10 at night     Pain Location  Hip    Pain Orientation  Right    Pain Descriptors / Indicators  Aching;Tightness;Sore                      [facility] Adult PT  Treatment/Exercise - 02/27/17 0001      Therapeutic Activites    Therapeutic Activities  -- education re sleeping on the Lt side/back for relief on Rt       Knee/Hip Exercises: Stretches   Passive Hamstring Stretch  Right;Left;2 reps;30 seconds    Piriformis Stretch  Right;Left;30 seconds;3 reps    Other Knee/Hip Stretches  ITB stretch in supine with strap x 30 sec x 2 reps each leg.       Moist Heat Therapy   Number Minutes Moist Heat  20 Minutes    Moist Heat Location  Lumbar Spine;Hip bilat applied in supine      Electrical Stimulation   Electrical Stimulation Location  Rt adductor/ Rt lateral hip     Electrical Stimulation Action  IFC    Electrical Stimulation Parameters  to tolerance    Electrical Stimulation Goals  Tone;Pain      Ultrasound   Ultrasound Location  Rt posterior hi    Ultrasound Parameters  1.5 w/cm2; 1 mHz; 100%; 8 min     Ultrasound Goals  Pain;Other (Comment)      Manual Therapy   Manual therapy comments  pt prone and supine     Soft tissue mobilization  deep tissue work anterior Rt hip through hip flexors/adductors; bilat posterior hips sacrum to posterior greater trochanter Rt > Lt; Rt anterior lateral hip musculature    Myofascial Release  bilat posterior hips     Passive ROM  PROM IR/ER; abduction RLE    Manual Traction  through extended leg        Trigger Point Dry Needling - 02/27/17 1253    Consent Given?  Yes    Muscles Treated Lower Body  -- Rt     Gluteus Maximus Response  Palpable increased muscle length    Gluteus Minimus Response  Palpable increased muscle length    Piriformis Response  Palpable increased muscle length                PT Long Term Goals - 02/13/17 1410      PT LONG TERM GOAL #1   Title  independent with HEP (03/05/17)    Time  6    Period  Weeks    Status  On-going      PT LONG TERM GOAL #2   Title  improve L hip abduction strength to 4+/5 for improved activity tolerance (03/05/17)    Time  6    Period   Weeks    Status  On-going      PT LONG TERM GOAL #3   Title  report ability to walk > 15 min without increase in pain for improved activity tolerance and decreased pain (03/05/17)    Time  6    Period  Weeks    Status  On-going limited; only able to walk 10 min       PT LONG TERM GOAL #4   Title  improve FOTO to </= 41% limited (03/05/17)    Time  6    Period  Weeks    Status  On-going      PT LONG TERM GOAL #5   Title  decrease pain in Rt hip by 50-75% allowing patient to sleep on Rt side for 3-4 hours at night (03/05/17)    Time  6    Period  Weeks    Status  Achieved              Patient will benefit from skilled therapeutic intervention in order to improve the following deficits and impairments:     Visit Diagnosis: Pain of right hip joint  Other symptoms and signs involving the musculoskeletal system  Weakness generalized     Problem List Patient Active Problem List   Diagnosis Date Noted  . Right leg pain 02/06/2017  . Chronic tension-type headache, intractable 02/05/2017  . Prediabetes 02/05/2017  . Lump in throat 01/27/2017  . Dysphagia 01/27/2017  . No energy 09/15/2016  . Bad taste in mouth 07/26/2016  . Paresthesia 04/03/2016  . Facet hypertrophy of lumbosacral region 04/03/2016  . Chronic back pain 04/03/2016  . Right knee pain 02/19/2016  . Rosacea 02/04/2016  . Vaginal atrophy 01/31/2016  . Idiopathic scoliosis 01/22/2016  . Hip bursitis 12/25/2015  . Biceps tendonitis on right 12/25/2015  . Diverticulitis of colon 12/21/2015  . GERD (gastroesophageal reflux disease) 11/27/2015  . Osteoporosis 11/27/2015  . GAD (generalized anxiety disorder) 11/27/2015  . MDD (major depressive disorder), recurrent, in full remission ([place]) 11/27/2015  . Chronic dryness of both eyes 11/27/2015  .  Hypothyroidism 11/27/2015  . Asthma, chronic 11/27/2015  . Insomnia 11/27/2015  . Seborrheic keratoses 11/27/2015    Michon Kaczmarek Nilda Simmer PT, MPH  02/27/2017, 12:54  PM  [facility] [place], [place], [address] Phone: [phone]   Fax:  [phone]  Name: Leslie Roberson MRN: [id number] Date of Birth: 02/16/1956

## 2017-02-27 NOTE — Telephone Encounter (Signed)
Billing question . 

## 2017-03-04 ENCOUNTER — Encounter: Payer: Self-pay | Admitting: Rehabilitative and Restorative Service Providers"

## 2017-03-06 ENCOUNTER — Encounter: Payer: Self-pay | Admitting: Rehabilitative and Restorative Service Providers"

## 2017-03-06 ENCOUNTER — Ambulatory Visit: Payer: BLUE CROSS/BLUE SHIELD | Admitting: Rehabilitative and Restorative Service Providers"

## 2017-03-06 DIAGNOSIS — M25552 Pain in left hip: Secondary | ICD-10-CM | POA: Diagnosis not present

## 2017-03-06 DIAGNOSIS — R531 Weakness: Secondary | ICD-10-CM | POA: Diagnosis not present

## 2017-03-06 DIAGNOSIS — M25551 Pain in right hip: Secondary | ICD-10-CM | POA: Diagnosis not present

## 2017-03-06 DIAGNOSIS — R29898 Other symptoms and signs involving the musculoskeletal system: Secondary | ICD-10-CM

## 2017-03-06 NOTE — Therapy (Signed)
[facility] [place], [place], [address] Phone: [phone]   Fax:  [phone]  Physical Therapy Treatment  Patient Details  Name: Leslie Roberson MRN: [id number] Date of Birth: 1955-07-05 Referring Provider: Dr Georgina Snell    Encounter Date: 03/06/2017  PT End of Session - 03/06/17 1116    Visit Number  10    Number of Visits  12    Date for PT Re-Evaluation  03/30/17    PT Start Time  1102    PT Stop Time  1200    PT Time Calculation (min)  58 min    Activity Tolerance  Patient tolerated treatment well       Past Medical History:  Diagnosis Date  . Anxiety   . Asthma, chronic 11/27/2015  . Depression   . Diverticulitis of colon 12/21/2015   Colonoscopy every 5 years/digestive health.   . GERD (gastroesophageal reflux disease) 11/27/2015  . Idiopathic scoliosis 01/22/2016  . Osteoporosis 11/27/2015   On prolia.   . Thyroid disease     Past Surgical History:  Procedure Laterality Date  . ABDOMINAL HYSTERECTOMY    . bladder mesh    . ELBOW SURGERY Right   . SPINAL FUSION    . TONSILLECTOMY    . TOTAL HIP ARTHROPLASTY      There were no vitals filed for this visit.  Subjective Assessment - 03/06/17 1401    Subjective  Leslie Roberson reports that she has done great this week until last night. She has been sleeping well without awakening due to pain until last night when she was again awake with 8/10 pain in the Rt hip. She did experience some digestion problems last night and was up and down to the bathroom early in the evening., She is not sure if that contributed to her increase in pain.     Currently in Pain?  No/denies    Pain Score  -- 8/10 last night                       [facility] Adult PT Treatment/Exercise - 03/06/17 0001      Knee/Hip Exercises: Stretches   Passive Hamstring Stretch  Right;Left;2 reps;30 seconds    Piriformis Stretch  Right;Left;30 seconds;3 reps    Other  Knee/Hip Stretches  ITB stretch in supine with strap x 30 sec x 2 reps each leg.       Moist Heat Therapy   Number Minutes Moist Heat  20 Minutes    Moist Heat Location  Lumbar Spine;Hip bilat applied in supine      Electrical Stimulation   Electrical Stimulation Location  Rt adductor/ Rt lateral hip     Electrical Stimulation Action  IFC    Electrical Stimulation Parameters  to tolerance    Electrical Stimulation Goals  Tone;Pain      Ultrasound   Ultrasound Location  Rt posterior hip x 6 min; Rt lower lumbar/sacral area x 2 min     Ultrasound Parameters  1.5 w/cm2; 1 mHZ; 100%; 8 min     Ultrasound Goals  Pain;Other (Comment)      Manual Therapy   Manual therapy comments  pt prone and supine     Soft tissue mobilization  deep tissue work anterior Rt hip through hip flexors/adductors; bilat posterior hips sacrum to posterior greater trochanter Rt > Lt; Rt anterior lateral hip musculature    Myofascial Release  bilat posterior hips  Passive ROM  PROM IR/ER; abduction RLE    Manual Traction  through extended leg                   PT Long Term Goals - 03/06/17 1400      PT LONG TERM GOAL #1   Title  independent with HEP (03/05/17)    Time  6    Period  Weeks    Status  Achieved      PT LONG TERM GOAL #2   Title  improve L hip abduction strength to 4+/5 for improved activity tolerance (03/05/17)    Time  6    Period  Weeks    Status  Partially Met      PT LONG TERM GOAL #3   Title  report ability to walk > 15 min without increase in pain for improved activity tolerance and decreased pain (03/05/17)    Time  6    Period  Weeks    Status  Partially Met      PT LONG TERM GOAL #4   Title  improve FOTO to </= 41% limited (03/05/17)    Time  6    Period  Weeks    Status  On-going      PT LONG TERM GOAL #5   Title  decrease pain in Rt hip by 50-75% allowing patient to sleep on Rt side for 3-4 hours at night (03/05/17)    Time  6    Period  Weeks    Status   Achieved            Plan - 03/06/17 1352    Clinical Impression Statement  Leslie Roberson is progressing well overall. She has been sleeping without pain in the Rt hip for 4 of 5 of the past 5 night. She did not sleep well last night reporting pain in the Rt hip again at 8/10 level. She continues to have muscular tightness through the posterior and lateral Rt hip as well as Rt lumbar area today. Leslie Roberson has responded well to manual work and HEP. She is progressing gradually toward stated goals of therapy.      Rehab Potential  Good    PT Frequency  2x / week    PT Duration  6 weeks    PT Treatment/Interventions  Patient/family education;ADLs/Self Care Home Management;Cryotherapy;Electrical Stimulation;Iontophoresis 92m/ml Dexamethasone;Moist Heat;Ultrasound;Dry needling;Manual techniques;Therapeutic activities;Therapeutic exercise;Neuromuscular re-education    PT Next Visit Plan  DN/manual work bilat hips; modalities as indicated; [place]    Consulted and Agree with Plan of Care  Patient       Patient will benefit from skilled therapeutic intervention in order to improve the following deficits and impairments:  Postural dysfunction, Improper body mechanics, Pain, Increased fascial restricitons, Increased muscle spasms, Decreased strength, Decreased mobility, Decreased range of motion, Decreased activity tolerance  Visit Diagnosis: Pain of right hip joint  Other symptoms and signs involving the musculoskeletal system  Weakness generalized  Pain of left hip joint     Problem List Patient Active Problem List   Diagnosis Date Noted  . Right leg pain 02/06/2017  . Chronic tension-type headache, intractable 02/05/2017  . Prediabetes 02/05/2017  . Lump in throat 01/27/2017  . Dysphagia 01/27/2017  . No energy 09/15/2016  . Bad taste in mouth 07/26/2016  . Paresthesia 04/03/2016  . Facet hypertrophy of lumbosacral region 04/03/2016  . Chronic back pain 04/03/2016  . Right knee pain  02/19/2016  . Rosacea 02/04/2016  .  Vaginal atrophy 01/31/2016  . Idiopathic scoliosis 01/22/2016  . Hip bursitis 12/25/2015  . Biceps tendonitis on right 12/25/2015  . Diverticulitis of colon 12/21/2015  . GERD (gastroesophageal reflux disease) 11/27/2015  . Osteoporosis 11/27/2015  . GAD (generalized anxiety disorder) 11/27/2015  . MDD (major depressive disorder), recurrent, in full remission ([place]) 11/27/2015  . Chronic dryness of both eyes 11/27/2015  . Hypothyroidism 11/27/2015  . Asthma, chronic 11/27/2015  . Insomnia 11/27/2015  . Seborrheic keratoses 11/27/2015    Leslie Roberson Leslie Roberson PT, MPH  03/06/2017, 2:05 PM  [facility] [place], [place], [address] Phone: [phone]   Fax:  [phone]  Name: Leslie Roberson MRN: [id number] Date of Birth: Aug 11, 1955

## 2017-03-09 ENCOUNTER — Ambulatory Visit (HOSPITAL_COMMUNITY): Payer: Self-pay | Admitting: Licensed Clinical Social Worker

## 2017-03-10 ENCOUNTER — Other Ambulatory Visit: Payer: Self-pay | Admitting: Family Medicine

## 2017-03-10 DIAGNOSIS — M7061 Trochanteric bursitis, right hip: Secondary | ICD-10-CM

## 2017-03-11 ENCOUNTER — Other Ambulatory Visit: Payer: Self-pay | Admitting: Physician Assistant

## 2017-03-11 DIAGNOSIS — F3342 Major depressive disorder, recurrent, in full remission: Secondary | ICD-10-CM

## 2017-03-11 DIAGNOSIS — F411 Generalized anxiety disorder: Secondary | ICD-10-CM

## 2017-03-12 ENCOUNTER — Other Ambulatory Visit: Payer: Self-pay | Admitting: Physician Assistant

## 2017-03-13 ENCOUNTER — Ambulatory Visit (INDEPENDENT_AMBULATORY_CARE_PROVIDER_SITE_OTHER): Payer: BLUE CROSS/BLUE SHIELD | Admitting: Rehabilitative and Restorative Service Providers"

## 2017-03-13 ENCOUNTER — Encounter: Payer: Self-pay | Admitting: Rehabilitative and Restorative Service Providers"

## 2017-03-13 DIAGNOSIS — M25551 Pain in right hip: Secondary | ICD-10-CM | POA: Diagnosis not present

## 2017-03-13 DIAGNOSIS — R29898 Other symptoms and signs involving the musculoskeletal system: Secondary | ICD-10-CM

## 2017-03-13 DIAGNOSIS — R531 Weakness: Secondary | ICD-10-CM | POA: Diagnosis not present

## 2017-03-13 NOTE — Telephone Encounter (Signed)
Patient came in today to inquire about approval for the Prolia injection. She stated that her insurance still has not paid for the first injection. She states that she has argued with insurance herself and our office has been on conference calls with her and the insurance company but they are still not paying due to a lack of information. I am forwarding this information on to our office manager to see if we can find out what is happening with this issue. Thanks!

## 2017-03-13 NOTE — Therapy (Signed)
[facility] [place], [place], [address] Phone: [phone]   Fax:  [phone]  Physical Therapy Treatment  Patient Details  Name: Leslie Roberson MRN: [id number] Date of Birth: [birth year] Referring Provider: Dr Georgina Snell    Encounter Date: 03/13/2017  PT End of Session - 03/13/17 1335    Visit Number  11    Number of Visits  12    Date for PT Re-Evaluation  03/30/17    PT Start Time  1332    PT Stop Time  1429    PT Time Calculation (min)  57 min    Activity Tolerance  Patient tolerated treatment well       Past Medical History:  Diagnosis Date  . Anxiety   . Asthma, chronic 11/27/2015  . Depression   . Diverticulitis of colon 12/21/2015   Colonoscopy every 5 years/digestive health.   . GERD (gastroesophageal reflux disease) 11/27/2015  . Idiopathic scoliosis 01/22/2016  . Osteoporosis 11/27/2015   On prolia.   . Thyroid disease     Past Surgical History:  Procedure Laterality Date  . ABDOMINAL HYSTERECTOMY    . bladder mesh    . ELBOW SURGERY Right   . SPINAL FUSION    . TONSILLECTOMY    . TOTAL HIP ARTHROPLASTY      There were no vitals filed for this visit.  Subjective Assessment - 03/13/17 1336    Subjective  Leslie Roberson reports thta she is improving. She can lie  on Rt side to sleep for several hours. She is working on the [employer] and plans to go to a massage therapist on a regular basis to maintain gains.     Currently in Pain?  No/denies         [facility] PT Assessment - 03/13/17 0001      Assessment   Medical Diagnosis  Rt trochanteric bursitis; trapezius strain     Referring Provider  Dr Georgina Snell     Onset Date/Surgical Date  11/12/16    Hand Dominance  Right    Next MD Visit  PRN      Observation/Other Assessments   Focus on Therapeutic Outcomes (FOTO)   34$ limitation       Posture/Postural Control   Posture Comments  scoliosis Rt convex thoracic curve; Lt concave; Rt convex lumbar  curve with rotation of trunk       Strength   Right Hip Extension  -- 5-/5    Right Hip ABduction  -- 5-/5    Left Hip Extension  5/5    Left Hip ABduction  5/5      Flexibility   Hamstrings  85-90 deg bilat     Quadriceps  WFL's bilat     ITB  WFL's bilat     Piriformis  tight Rt > Lt       Palpation   Palpation comment  tight Rt sacral border to posterior greater trochanter; Lt posterior greater trochanter                   [facility] Adult PT Treatment/Exercise - 03/13/17 0001      Knee/Hip Exercises: Stretches   Passive Hamstring Stretch  Right;Left;2 reps;30 seconds    Piriformis Stretch  Right;Left;30 seconds;3 reps    Other Knee/Hip Stretches  ITB stretch in supine with strap x 30 sec x 2 reps each leg.       Moist Heat Therapy   Number Minutes Moist Heat  20 Minutes    Moist Heat Location  Lumbar Spine;Hip bilat applied in supine      Electrical Stimulation   Electrical Stimulation Location  Rt adductor/ Rt lateral hip     Electrical Stimulation Action  IFC    Electrical Stimulation Parameters  to tolerance    Electrical Stimulation Goals  Tone;Pain      Manual Therapy   Manual therapy comments  pt prone and supine     Soft tissue mobilization  deep tissue work anterior Rt hip through hip flexors/adductors; bilat posterior hips sacrum to posterior greater trochanter Rt > Lt; Rt anterior lateral hip musculature    Myofascial Release  bilat posterior hips     Passive ROM  PROM IR/ER; abduction RLE    Manual Traction  through extended leg              PT Education - 03/13/17 1409    Education provided  Yes    Education Details  importance of consistent HEP and regular massage work     Forensic psychologist) Educated  Patient    Methods  Explanation    Comprehension  Verbalized understanding          PT Long Term Goals - 03/13/17 1336      PT LONG TERM GOAL #1   Title  independent with HEP (03/05/17)    Time  6    Period  Weeks    Status  Achieved       PT LONG TERM GOAL #2   Title  improve L hip abduction strength to 4+/5 for improved activity tolerance (03/05/17)    Time  6    Period  Weeks    Status  Achieved      PT LONG TERM GOAL #3   Title  report ability to walk > 15 min without increase in pain for improved activity tolerance and decreased pain (03/05/17)    Time  6    Period  Weeks    Status  Partially Met      PT LONG TERM GOAL #4   Title  improve FOTO to </= 41% limited (03/05/17)    Time  6    Period  Weeks    Status  Achieved      PT LONG TERM GOAL #5   Title  decrease pain in Rt hip by 50-75% allowing patient to sleep on Rt side for 3-4 hours at night (03/05/17)    Time  6    Period  Weeks    Status  Achieved            Plan - 03/13/17 1409    Clinical Impression Statement  Leslie Roberson has made good progress with rehab. She has accomplished goals of therapy. She is now sleeping without awakening due to Rt hip pain. She has returned to normal functional activities. Leslie Roberson will continue with HEP and schedule regular massage work to maintain gains. She will call with any questions or problems.     Rehab Potential  Good    PT Frequency  2x / week    PT Duration  6 weeks    PT Treatment/Interventions  Patient/family education;ADLs/Self Care Home Management;Cryotherapy;Electrical Stimulation;Iontophoresis 66m/ml Dexamethasone;Moist Heat;Ultrasound;Dry needling;Manual techniques;Therapeutic activities;Therapeutic exercise;Neuromuscular re-education    PT Next Visit Plan  D/C to independent HEP     Consulted and Agree with Plan of Care  Patient       Patient will benefit from skilled therapeutic intervention in order to  improve the following deficits and impairments:  Postural dysfunction, Improper body mechanics, Pain, Increased fascial restricitons, Increased muscle spasms, Decreased strength, Decreased mobility, Decreased range of motion, Decreased activity tolerance  Visit Diagnosis: Pain of right hip  joint  Other symptoms and signs involving the musculoskeletal system  Weakness generalized     Problem List Patient Active Problem List   Diagnosis Date Noted  . Right leg pain 02/06/2017  . Chronic tension-type headache, intractable 02/05/2017  . Prediabetes 02/05/2017  . Lump in throat 01/27/2017  . Dysphagia 01/27/2017  . No energy 09/15/2016  . Bad taste in mouth 07/26/2016  . Paresthesia 04/03/2016  . Facet hypertrophy of lumbosacral region 04/03/2016  . Chronic back pain 04/03/2016  . Right knee pain 02/19/2016  . Rosacea 02/04/2016  . Vaginal atrophy 01/31/2016  . Idiopathic scoliosis 01/22/2016  . Hip bursitis 12/25/2015  . Biceps tendonitis on right 12/25/2015  . Diverticulitis of colon 12/21/2015  . GERD (gastroesophageal reflux disease) 11/27/2015  . Osteoporosis 11/27/2015  . GAD (generalized anxiety disorder) 11/27/2015  . MDD (major depressive disorder), recurrent, in full remission ([place]) 11/27/2015  . Chronic dryness of both eyes 11/27/2015  . Hypothyroidism 11/27/2015  . Asthma, chronic 11/27/2015  . Insomnia 11/27/2015  . Seborrheic keratoses 11/27/2015    Bilan Tedesco Nilda Simmer PT, MPH  03/13/2017, 2:12 PM  [facility] [place], [place], [address] Phone: [phone]   Fax:  [phone]  Name: Leslie Roberson MRN: [id number] Date of Birth: 02/14/1956   PHYSICAL THERAPY DISCHARGE SUMMARY  Visits from Start of Care: 11  Current functional level related to goals / functional outcomes: See progress note for discharge status. Excellent progress overall.   Remaining deficits: Needs to continue with HEP and regular massage work   Education / Equipment: HEP  Plan: Patient agrees to discharge.  Patient goals were met. Patient is being discharged due to meeting the stated rehab goals.  ?????    Doralyn Kirkes P. Helene Kelp PT, MPH 03/13/17 2:13 PM

## 2017-03-17 ENCOUNTER — Encounter: Payer: Self-pay | Admitting: Physician Assistant

## 2017-03-17 ENCOUNTER — Ambulatory Visit (INDEPENDENT_AMBULATORY_CARE_PROVIDER_SITE_OTHER): Payer: BLUE CROSS/BLUE SHIELD | Admitting: Physician Assistant

## 2017-03-17 VITALS — BP 123/65 | HR 96

## 2017-03-17 DIAGNOSIS — L600 Ingrowing nail: Secondary | ICD-10-CM | POA: Diagnosis not present

## 2017-03-17 MED ORDER — HYDROCODONE-ACETAMINOPHEN 5-325 MG PO TABS: 1.0000 | ORAL_TABLET | Freq: Three times a day (TID) | ORAL | 0 refills | Status: DC | PRN

## 2017-03-17 NOTE — Progress Notes (Addendum)
Subjective:    Patient ID: Leslie Roberson, female    DOB: [birth year], 61 y.o.   MRN: [id number]  HPI Patient is a 61 y/o female who presents for follow-up of ingrown toenail. She reports continued pain and swelling on the medial aspect of the great toenail of her right foot, despite, epson foot soaks and wearing loose fitting shoes. She would like the nail removed today.  .. Active Ambulatory Problems    Diagnosis Date Noted  . GERD (gastroesophageal reflux disease) 11/27/2015  . Osteoporosis 11/27/2015  . GAD (generalized anxiety disorder) 11/27/2015  . MDD (major depressive disorder), recurrent, in full remission ([place]) 11/27/2015  . Chronic dryness of both eyes 11/27/2015  . Hypothyroidism 11/27/2015  . Asthma, chronic 11/27/2015  . Insomnia 11/27/2015  . Seborrheic keratoses 11/27/2015  . Diverticulitis of colon 12/21/2015  . Hip bursitis 12/25/2015  . Biceps tendonitis on right 12/25/2015  . Idiopathic scoliosis 01/22/2016  . Vaginal atrophy 01/31/2016  . Rosacea 02/04/2016  . Right knee pain 02/19/2016  . Paresthesia 04/03/2016  . Facet hypertrophy of lumbosacral region 04/03/2016  . Chronic back pain 04/03/2016  . Bad taste in mouth 07/26/2016  . No energy 09/15/2016  . Lump in throat 01/27/2017  . Dysphagia 01/27/2017  . Chronic tension-type headache, intractable 02/05/2017  . Prediabetes 02/05/2017  . Right leg pain 02/06/2017   Resolved Ambulatory Problems    Diagnosis Date Noted  . Acute medial meniscus tear 04/23/2016  . Left knee pain 04/28/2016  . Mild intermittent asthma without complication 94/85/4627   Past Medical History:  Diagnosis Date  . Anxiety   . Asthma, chronic 11/27/2015  . Depression   . Diverticulitis of colon 12/21/2015  . GERD (gastroesophageal reflux disease) 11/27/2015  . Idiopathic scoliosis 01/22/2016  . Osteoporosis 11/27/2015  . Thyroid disease      Review of Systems See HPI, all other systems reviewed are negative.     Objective:   Physical Exam  Constitutional: She is oriented to person, place, and time. She appears well-developed and well-nourished.  HENT:  Head: Normocephalic and atraumatic.  Neurological: She is alert and oriented to person, place, and time.  Skin: Skin is warm and dry.  Mild erythema, tenderness and swelling of the medial aspect of her right great toenail.  Psychiatric: She has a normal mood and affect. Her behavior is normal. Thought content normal.  Vitals reviewed.     Assessment & Plan:  Marland KitchenMarland KitchenDiagnoses and all orders for this visit:  Ingrown right greater toenail  Other orders -     HYDROcodone-acetaminophen (NORCO/VICODIN) 5-325 MG tablet; Take 1 tablet by mouth every 8 (eight) hours as needed for moderate pain. After toenail removal.  Ingrown right greater toenail - Removal of ingrown toenail today. Advised patient to keep the area clean and dry for 24 hours, she can shower and wash the area after 24 hours. Provided patient with prescription for Norco for acute pain relief. Discussed signs of infection and to follow-up if she notices increased pain, swelling, warm, drainage, fever, or nausea.  [place] controlled substance database reviewed with no concerns.  Acute supply only.   Toenail Avulsion Procedure Note  Pre-operative Diagnosis: Right Ingrown Great toenail   Post-operative Diagnosis: Right Ingrown Great toenail  Indications: Pain, erythema and swelling of medial aspect of right great toenail characteristic of ingrown toenail  Anesthesia: Lidocaine 1% without epinephrine without added sodium bicarbonate  Procedure Details  History of allergy to iodine: no  The risks (including bleeding and  infection) and benefits of the  procedure and Verbal informed consent obtained.  After digital block anesthesia was obtained, a tourniquet was applied for hemostasis during the procedure. After prepping with Betadine, the offending edge of the nail was freed from the nailbed  and perionychium, and then split with scissors and removed with forceps.  All visible granulation tissue is debrided. Antibiotic and bulky dressing was applied.   Findings: Ingrown right great toenail.  Complications: none.  Plan: 1. Soak the foot twice daily. Change dressing twice daily until healed over. 2. Warning signs of infection were reviewed.   3. Recommended that the patient use Vicodin as needed for pain.  4. Return in PRN if she develop signs of infection such as increased pain, swelling, warm, drainage, fever, or nausea.

## 2017-03-17 NOTE — Patient Instructions (Signed)
Fingernail or Toenail Removal, Adult, Care After  This sheet gives you information about how to care for yourself after your procedure. Your health care provider may also give you more specific instructions. If you have problems or questions, contact your health care provider.  What can I expect after the procedure?  After the procedure, it is common to have:  · Pain.  · Redness.  · Swelling.  · Soreness.    Follow these instructions at home:  · If you have a splint:  ? Do not put pressure on any part of the splint until it is fully hardened. This may take several hours.  ? Wear the splint as told by your health care provider. Remove it only as told by your health care provider.  ? Loosen the splint if your fingers or toes tingle, become numb, or turn cold and blue.  ? Keep the splint clean.  ? If the splint is not waterproof:  § Do not let it get wet.  § Cover it with a watertight covering when you take a bath or a shower.  Wound care    · Follow instructions from your health care provider about how to take care of your wound. Make sure you:  ? Wash your hands with soap and water before you change your bandage (dressing). If soap and water are not available, use hand sanitizer.  ? Change your dressing as told by your health care provider.  ? Keep your dressing dry until your health care provider says it can be removed.  ? Leave stitches (sutures), skin glue, or adhesive strips in place. These skin closures may need to stay in place for 2 weeks or longer. If adhesive strip edges start to loosen and curl up, you may trim the loose edges. Do not remove adhesive strips completely unless your health care provider tells you to do that.  · Check your wound every day for signs of infection. Check for:  ? More redness, swelling, or pain.  ? More fluid or blood.  ? Warmth.  ? Pus or a bad smell.  Managing pain, stiffness, and swelling  · Move your fingers or toes often to avoid stiffness and to lessen swelling.  · Raise  (elevate) the injured area above the level of your heart while you are sitting or lying down. You may need to keep your finger or toe raised or supported on a pillow for 24 hours or as told by your health care provider.  · Soak your hand or foot in warm, soapy water for 10-20 minutes, 3 times a day or as told by your health care provider.  Medicine  · Take over-the-counter and prescription medicines only as told by your health care provider.  · If you were prescribed an antibiotic medicine, use it as told by your health care provider. Do not stop using the antibiotic even if your condition improves.  General instructions  · If you were given a shoe to wear, wear it as told by your health care provider.  · Keep all follow-up visits as told by your health care provider. This is important.  Contact a health care provider if:  · You have more redness, swelling, or pain around your wound.  · You have more fluid or blood coming from your wound.  · Your wound feels warm to the touch.  · You have pus or a bad smell coming from your wound.  · You have a fever.  · Your   finger or toe looks blue or black.  This information is not intended to replace advice given to you by your health care provider. Make sure you discuss any questions you have with your health care provider.  Document Released: 04/07/2014 Document Revised: 11/14/2015 Document Reviewed: 09/24/2015  Elsevier Interactive Patient Education © 2018 [employer]vier Inc[employer]      --------------------------  Ingrown Toenail  An ingrown toenail occurs when the corner or sides of your toenail grow into the surrounding skin. The big toe is most commonly affected, but it can happen to any of your toes. If your ingrown toenail is not treated, you will be at risk for infection.  What are the causes?  This condition may be caused by:  · Wearing shoes that are too small or tight.  · Injury or trauma, such as stubbing your toe or having your toe stepped on.  · Improper cutting or care of  your toenails.  · Being born with (congenital) nail or foot abnormalities, such as having a nail that is too big for your toe.    What increases the risk?  Risk factors for an ingrown toenail include:  · Age. Your nails tend to thicken as you get older, so ingrown nails are more common in older people.  · Diabetes.  · Cutting your toenails incorrectly.  · Blood circulation problems.    What are the signs or symptoms?  Symptoms may include:  · Pain, soreness, or tenderness.  · Redness.  · Swelling.  · Hardening of the skin surrounding the toe.    Your ingrown toenail may be infected if there is fluid, pus, or drainage.  How is this diagnosed?  An ingrown toenail may be diagnosed by medical history and physical exam. If your toenail is infected, your health care provider may test a sample of the drainage.  How is this treated?  Treatment depends on the severity of your ingrown toenail. Some ingrown toenails may be treated at home. More severe or infected ingrown toenails may require surgery to remove all or part of the nail. Infected ingrown toenails may also be treated with antibiotic medicines.  Follow these instructions at home:  · If you were prescribed an antibiotic medicine, finish all of it even if you start to feel better.  · Soak your foot in warm soapy water for 20 minutes, 3 times per day or as directed by your health care provider.  · Carefully lift the edge of the nail away from the sore skin by wedging a small piece of cotton under the corner of the nail. This may help with the pain. Be careful not to cause more injury to the area.  · Wear shoes that fit well. If your ingrown toenail is causing you pain, try wearing sandals, if possible.  · Trim your toenails regularly and carefully. Do not cut them in a curved shape. Cut your toenails straight across. This prevents injury to the skin at the corners of the toenail.  · Keep your feet clean and dry.  · If you are having trouble walking and are given  crutches by your health care provider, use them as directed.  · Do not pick at your toenail or try to remove it yourself.  · Take medicines only as directed by your health care provider.  · Keep all follow-up visits as directed by your health care provider. This is important.  Contact a health care provider if:  · Your symptoms do not improve with   treatment.  Get help right away if:  · You have red streaks that start at your foot and go up your leg.  · You have a fever.  · You have increased redness, swelling, or pain.  · You have fluid, blood, or pus coming from your toenail.  This information is not intended to replace advice given to you by your health care provider. Make sure you discuss any questions you have with your health care provider.  Document Released: 03/14/2000 Document Revised: 08/17/2015 Document Reviewed: 02/08/2014  Elsevier Interactive Patient Education © 2018 Elsevier Inc.

## 2017-03-19 ENCOUNTER — Ambulatory Visit (INDEPENDENT_AMBULATORY_CARE_PROVIDER_SITE_OTHER): Payer: BLUE CROSS/BLUE SHIELD | Admitting: Licensed Clinical Social Worker

## 2017-03-19 ENCOUNTER — Telehealth: Payer: Self-pay | Admitting: Physician Assistant

## 2017-03-19 DIAGNOSIS — F331 Major depressive disorder, recurrent, moderate: Secondary | ICD-10-CM | POA: Diagnosis not present

## 2017-03-19 DIAGNOSIS — F411 Generalized anxiety disorder: Secondary | ICD-10-CM

## 2017-03-19 NOTE — Progress Notes (Signed)
   THERAPIST PROGRESS NOTE  Session Time: 2:10pm-2:58pm  Participation Level: Active  Behavioral Response: CasualAlertEuthymic  Type of Therapy: Individual Therapy  Treatment Goals addressed: Elevate mood and show evidence of usual energy, activities and socialization level; learn and apply emotional regulation skills  Interventions: Mindfulness  Suicidal/Homicidal: Denied both  Therapist Interventions: Introduced the concept of mindfulness.  Emphasized how learning to focus on the present can help you to feel more in control of your emotions.  Explained how it can be useful to practice at times when you catch yourself having unhelpful thoughts.  Described how you can practice mindfulness by doing tasks in a mindful way, focusing on an object, or through focused breathing.  Encouraged patient to practice the skills regularly in addition to times of distress.  Summary: Reported she has done some meditation before and found it to be helpful.   When having an anxiety attack or feeling depressed she has a tendency to judge how she is feeling.  Indicated she is going to work on using mindfulness to adopt an attitude of acceptance about her experience and cope with it as best she can.   Reported since last seen has not had any panic attacks.  Anxiety overall has been manageable.   Plan: Return again in 3 weeks.  Will check on implementation of mindfulness skills.  Diagnosis: GAD                         MDD, recurrent, moderate    Armandina Stammer 03/19/2017

## 2017-03-19 NOTE — Telephone Encounter (Signed)
Called insurance and was transferred to [place] [phone]) and spoke with Sharyn Lull. Was able to get Prolia approval Josem Kaufmann: [id number]) for dates: 03/19/17-02/18/18.   [employer] is unable to back date more than 2 days, so was advised I would have to contact the health plan to see if we could retrodate an approval for the bill in question from June.  Pt advised of current status. She is going out of town for the holiday but will schedule her next prolia shot when she returns.

## 2017-03-19 NOTE — Telephone Encounter (Signed)
Spoke with Audelia Acton at [employer]. Was advised the clinical information should be faxed to the local [employer] for review. Will fax.

## 2017-04-02 ENCOUNTER — Ambulatory Visit (HOSPITAL_COMMUNITY): Payer: Self-pay | Admitting: Licensed Clinical Social Worker

## 2017-04-07 ENCOUNTER — Ambulatory Visit (HOSPITAL_COMMUNITY): Payer: Self-pay | Admitting: Licensed Clinical Social Worker

## 2017-04-08 ENCOUNTER — Encounter: Payer: Self-pay | Admitting: Physician Assistant

## 2017-04-08 NOTE — Telephone Encounter (Signed)
Error

## 2017-04-08 NOTE — Telephone Encounter (Signed)
Patient called to let us know that she contacted her insurance company about the appeal process to get her previous Prolia injection covered by insurance. The fax number to appeal is [phone].

## 2017-04-08 NOTE — Telephone Encounter (Signed)
There was an update on this today from [place] as well. Will route to make sure everything has been done. If not, I will resend all info to that fax number.

## 2017-04-21 ENCOUNTER — Ambulatory Visit (HOSPITAL_COMMUNITY): Payer: BLUE CROSS/BLUE SHIELD | Admitting: Psychiatry

## 2017-04-21 ENCOUNTER — Encounter (HOSPITAL_COMMUNITY): Payer: Self-pay | Admitting: Psychiatry

## 2017-04-21 VITALS — BP 122/82 | HR 89 | Ht <= 58 in | Wt 126.0 lb

## 2017-04-21 DIAGNOSIS — Z9149 Other personal history of psychological trauma, not elsewhere classified: Secondary | ICD-10-CM | POA: Diagnosis not present

## 2017-04-21 DIAGNOSIS — F4310 Post-traumatic stress disorder, unspecified: Secondary | ICD-10-CM

## 2017-04-21 DIAGNOSIS — G4700 Insomnia, unspecified: Secondary | ICD-10-CM | POA: Diagnosis not present

## 2017-04-21 DIAGNOSIS — F331 Major depressive disorder, recurrent, moderate: Secondary | ICD-10-CM

## 2017-04-21 DIAGNOSIS — R450 Nervousness: Secondary | ICD-10-CM

## 2017-04-21 DIAGNOSIS — F411 Generalized anxiety disorder: Secondary | ICD-10-CM | POA: Diagnosis not present

## 2017-04-21 DIAGNOSIS — Z818 Family history of other mental and behavioral disorders: Secondary | ICD-10-CM

## 2017-04-21 DIAGNOSIS — F5102 Adjustment insomnia: Secondary | ICD-10-CM

## 2017-04-21 DIAGNOSIS — Z62810 Personal history of physical and sexual abuse in childhood: Secondary | ICD-10-CM

## 2017-04-21 MED ORDER — CLONAZEPAM 0.5 MG PO TABS: 0.5000 mg | ORAL_TABLET | Freq: Two times a day (BID) | ORAL | 1 refills | Status: DC | PRN

## 2017-04-21 MED ORDER — ESCITALOPRAM OXALATE 10 MG PO TABS: 15.0000 mg | ORAL_TABLET | Freq: Every day | ORAL | 0 refills | Status: DC
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
## 2017-04-24 ENCOUNTER — Ambulatory Visit (HOSPITAL_COMMUNITY): Payer: BLUE CROSS/BLUE SHIELD | Admitting: Licensed Clinical Social Worker

## 2017-04-24 DIAGNOSIS — F331 Major depressive disorder, recurrent, moderate: Secondary | ICD-10-CM | POA: Diagnosis not present

## 2017-04-24 DIAGNOSIS — F411 Generalized anxiety disorder: Secondary | ICD-10-CM

## 2017-04-24 DIAGNOSIS — F424 Excoriation (skin-picking) disorder: Secondary | ICD-10-CM

## 2017-04-24 NOTE — Progress Notes (Signed)
   THERAPIST PROGRESS NOTE  Session Time: 27:06CB-76:28BT  Participation Level: Active  Behavioral Response: CasualAlertEuthymic  Type of Therapy: Individual Therapy  Treatment Goals addressed: Elevate mood and show evidence of usual energy, activities and socialization level; learn and apply emotional regulation skills  Interventions: Habit Reversal Training  Suicidal/Homicidal: Denied both  Therapist Interventions: Patient brought up a new concern: a habit of picking at her scalp.  Provided patient with tips for replacing her habit with an alternative behavior.  Presented this is the form of 5 keys: blocking, fiddling, motion, waking up the area of your body that needs extra attention, and releasing tension.      Summary: Reported catching herself on a frequent basis picking at scabs on her scalp.  Reflecting on what may be causing her to engage in this behavior she said "I think it is related to my loneliness during the day, not feeling motivated to do things that need to get done, and needing to release tension."   Indicated she is receptive to implementing some of the suggestions.  Plans to push herself to be more active and work on tasks that need to be completed liked packing in preparation for moving next month.  Plans to find something she can fiddle with in the places where she is more likely to pick like at her computer or maybe squeeze a pillow at bedtime.  To release tension she may try massaging her scalp.   Reported her mood has been a bit down and she attributed it to the lack of exposure to sunshine.  Reported her anxiety level has been low.        Plan: Will schedule next session for March because she will be very busy next month with moving from an apartment into a house.  Due to review treatment plan at next session.  Diagnosis:  Skin picking habit  MDD, recurrent, moderate GAD   Armandina Stammer 04/24/2017

## 2017-04-27 ENCOUNTER — Telehealth (HOSPITAL_COMMUNITY): Payer: Self-pay | Admitting: Psychiatry

## 2017-04-27 NOTE — Telephone Encounter (Signed)
Pt states that she increased the leaxapro and it is making her more hungry.  Also she decreased the gabpentin and it is making her have a total anxiety attack.   Please advise.  [phone]

## 2017-04-27 NOTE — Telephone Encounter (Signed)
Informed patient of the changes in medication per Dr. De Nurse  In above message. She stated her understanding and will call back in a few days to review options if that does not work. Nothing further is needed at this time.

## 2017-04-27 NOTE — Telephone Encounter (Signed)
Can go back to lower lexapro taking before and increase gabapentin back to where not having panic attack for now Call back in a few days to re review options

## 2017-05-01 ENCOUNTER — Telehealth (HOSPITAL_COMMUNITY): Payer: Self-pay | Admitting: Psychiatry

## 2017-05-01 NOTE — Telephone Encounter (Signed)
Would like to speak to nurse about medications.

## 2017-05-01 NOTE — Telephone Encounter (Signed)
I spoke with patient and she states after lowering Lexapro and increasing Gabapentin per Dr. De Nurse on 04/27/17, she is doing better, but she is having trouble sleeping at night. Patient states she is only getting a couple of hours of sleep. Please review and advise.

## 2017-05-01 NOTE — Telephone Encounter (Signed)
Work on Publishing rights manager. Do not prefer to add a sleep aid for now. Avoid naps, caffeine. etc

## 2017-05-01 NOTE — Telephone Encounter (Signed)
Informed patient per Dr. De Nurse that she should avoid naps, caffeine etc. and let her know that he does not want to add a sleep aid right now. Nothing further is needed at this time.

## 2017-05-04 ENCOUNTER — Other Ambulatory Visit (HOSPITAL_COMMUNITY): Payer: Self-pay | Admitting: Psychiatry

## 2017-05-13 ENCOUNTER — Other Ambulatory Visit: Payer: Self-pay | Admitting: Physician Assistant

## 2017-05-13 ENCOUNTER — Other Ambulatory Visit (HOSPITAL_COMMUNITY): Payer: Self-pay

## 2017-05-13 ENCOUNTER — Telehealth (HOSPITAL_COMMUNITY): Payer: Self-pay

## 2017-05-13 MED ORDER — ESCITALOPRAM OXALATE 10 MG PO TABS: 10.0000 mg | ORAL_TABLET | Freq: Every day | ORAL | 0 refills | Status: DC

## 2017-05-13 NOTE — Telephone Encounter (Signed)
Pharmacy sent over fax requesting refill for Escitalopram 10 one daily. Sent over a one month supply of medication to pharmacy. Patients' next ov visit is on 06/16/17. Nothing further is needed at this time.

## 2017-05-19 ENCOUNTER — Other Ambulatory Visit: Payer: Self-pay | Admitting: Physician Assistant

## 2017-05-20 ENCOUNTER — Other Ambulatory Visit (HOSPITAL_COMMUNITY): Payer: Self-pay | Admitting: Psychiatry

## 2017-05-22 ENCOUNTER — Ambulatory Visit (INDEPENDENT_AMBULATORY_CARE_PROVIDER_SITE_OTHER): Payer: BLUE CROSS/BLUE SHIELD | Admitting: Physician Assistant

## 2017-05-22 ENCOUNTER — Encounter: Payer: Self-pay | Admitting: Physician Assistant

## 2017-05-22 VITALS — BP 132/62 | HR 98 | Ht <= 58 in | Wt 127.0 lb

## 2017-05-22 DIAGNOSIS — M810 Age-related osteoporosis without current pathological fracture: Secondary | ICD-10-CM

## 2017-05-22 DIAGNOSIS — L299 Pruritus, unspecified: Secondary | ICD-10-CM | POA: Diagnosis not present

## 2017-05-22 DIAGNOSIS — R0789 Other chest pain: Secondary | ICD-10-CM

## 2017-05-22 MED ORDER — CLOBETASOL PROPIONATE 0.05 % EX SOLN: 1.0000 "application " | Freq: Two times a day (BID) | CUTANEOUS | 1 refills | Status: DC

## 2017-05-22 MED ORDER — HYDROXYZINE HCL 25 MG PO TABS: 25.0000 mg | ORAL_TABLET | Freq: Three times a day (TID) | ORAL | 1 refills | Status: DC | PRN

## 2017-05-22 MED ORDER — DENOSUMAB 60 MG/ML ~~LOC~~ SOLN
60.0000 mg | Freq: Once | SUBCUTANEOUS | Status: AC
  Administered 2017-05-22: 60 mg via SUBCUTANEOUS

## 2017-05-22 NOTE — Progress Notes (Signed)
Subjective:    Patient ID: Leslie Roberson, female    DOB: Jan 05, 1956, 62 y.o.   MRN: [id number]  HPI   Pt is a 62 year old female with a long history of generalized anxiety disorder, MDD, osteoporosis, GERD who presents to the clinic with itching of scalp that started January 25.  She has been seen by psychologist recently and suspect that the picking of scalp is due to her anxiety.  Not patient admits that she has seen Dr. Lanell Matar and he is tapering her off the Klonopin.  It has been a slow taper she is really felt more anxious lately.  He has increased her Lexapro some.  She continues on Pristiq and also BuSpar.  For the last 2 days she has had some episodic chest pain over the sternum. She noticed the first time while shopping in [employer].  She admits she has been in the process of moving homes and very stressed.  She has noticed that every time she has a chest pain she is exerting herself.  She is at least had them 3 or 4 times a day for the last 2 days.  They can last anywhere from a few minutes to half an hour.  She has a history of acid reflux but she feels like this does not anything like her acid reflux feels.  Episodes are not related to eating.  She denies any chest tightness.  She describes the chest pain as sharp.  .. Active Ambulatory Problems    Diagnosis Date Noted  . GERD (gastroesophageal reflux disease) 11/27/2015  . Osteoporosis 11/27/2015  . GAD (generalized anxiety disorder) 11/27/2015  . MDD (major depressive disorder), recurrent, in full remission ([place]) 11/27/2015  . Chronic dryness of both eyes 11/27/2015  . Hypothyroidism 11/27/2015  . Asthma, chronic 11/27/2015  . Insomnia 11/27/2015  . Seborrheic keratoses 11/27/2015  . Diverticulitis of colon 12/21/2015  . Hip bursitis 12/25/2015  . Biceps tendonitis on right 12/25/2015  . Idiopathic scoliosis 01/22/2016  . Vaginal atrophy 01/31/2016  . Rosacea 02/04/2016  . Right knee pain 02/19/2016  . Paresthesia  04/03/2016  . Facet hypertrophy of lumbosacral region 04/03/2016  . Chronic back pain 04/03/2016  . Bad taste in mouth 07/26/2016  . No energy 09/15/2016  . Lump in throat 01/27/2017  . Dysphagia 01/27/2017  . Chronic tension-type headache, intractable 02/05/2017  . Prediabetes 02/05/2017  . Right leg pain 02/06/2017   Resolved Ambulatory Problems    Diagnosis Date Noted  . Acute medial meniscus tear 04/23/2016  . Left knee pain 04/28/2016  . Mild intermittent asthma without complication 64/33/2951   Past Medical History:  Diagnosis Date  . Anxiety   . Asthma, chronic 11/27/2015  . Depression   . Diverticulitis of colon 12/21/2015  . GERD (gastroesophageal reflux disease) 11/27/2015  . Idiopathic scoliosis 01/22/2016  . Osteoporosis 11/27/2015  . Thyroid disease       Review of Systems  All other systems reviewed and are negative.      Objective:   Physical Exam  Constitutional: She is oriented to person, place, and time. She appears well-developed and well-nourished.  HENT:  Head: Normocephalic and atraumatic.  Scattered fine scales over scalp with some clear scabbed over "picked at" lesion over entire scalp.   Cardiovascular: Normal rate, regular rhythm and normal heart sounds.  Pulmonary/Chest: Effort normal and breath sounds normal.  Neurological: She is alert and oriented to person, place, and time.  Psychiatric: She has a normal mood and affect. Her  behavior is normal.          Assessment & Plan:  Marland KitchenMarland KitchenDiagnoses and all orders for this visit:  Atypical chest pain -     EKG 12-Lead -     Exercise Tolerance Test  Osteoporosis, unspecified osteoporosis type, unspecified pathological fracture presence -     denosumab (PROLIA) injection 60 mg  Itchy scalp -     hydrOXYzine (ATARAX/VISTARIL) 25 MG tablet; Take 1 tablet (25 mg total) by mouth 3 (three) times daily as needed. For chest tightness and/or scalp itching -     clobetasol (TEMOVATE) 0.05 % external  solution; Apply 1 application topically 2 (two) times daily.     EKG- NSR at 92. No arrhthymias. Good p waves. Slight downsloping of the s portion of the QRS only in rhythm strip.  EKG reassuring. Pt has noticed "episodes" with exertion. I would like to get stress test to further reassure or rule out any ischemia. There is a level of anxiety present as she is being tapered off klonapin from dr. Rexene Alberts, psychiatrist. These could represent panic attacks.  Noticed she is over 50 and not on ASA. Discussed 81mg  ASA daily.   prolia injection given today.   I suspect some seb dermatitis or could also be some picking of scalp from anxiety or combination of both. Given topical steroid for seb derm. Given vistaril to use as needed for itching. Discussed she could also try vistaril to use for chest pain/panic attacks. Continue to go to counseling and follow up with psychiatrist.   Follow up in 1 month.   Marland Kitchen.Spent 30 minutes with patient and greater than 50 percent of visit spent counseling patient regarding treatment plan.

## 2017-05-22 NOTE — Patient Instructions (Signed)
Will order stress test.  Start vistaril as needed either for itching or chest tightness.  Start asa 81mg .  Topical steroid for your scalps contact dermitis vs seb dermaitis vs anxiety.

## 2017-05-24 ENCOUNTER — Encounter: Payer: Self-pay | Admitting: Physician Assistant

## 2017-05-24 ENCOUNTER — Other Ambulatory Visit: Payer: Self-pay | Admitting: Family Medicine

## 2017-05-24 DIAGNOSIS — R0789 Other chest pain: Secondary | ICD-10-CM | POA: Insufficient documentation

## 2017-05-24 DIAGNOSIS — L299 Pruritus, unspecified: Secondary | ICD-10-CM | POA: Insufficient documentation

## 2017-05-24 DIAGNOSIS — M7061 Trochanteric bursitis, right hip: Secondary | ICD-10-CM

## 2017-06-01 ENCOUNTER — Ambulatory Visit (INDEPENDENT_AMBULATORY_CARE_PROVIDER_SITE_OTHER): Payer: BLUE CROSS/BLUE SHIELD | Admitting: Licensed Clinical Social Worker

## 2017-06-01 DIAGNOSIS — F424 Excoriation (skin-picking) disorder: Secondary | ICD-10-CM

## 2017-06-01 DIAGNOSIS — F411 Generalized anxiety disorder: Secondary | ICD-10-CM | POA: Diagnosis not present

## 2017-06-01 DIAGNOSIS — F331 Major depressive disorder, recurrent, moderate: Secondary | ICD-10-CM | POA: Diagnosis not present

## 2017-06-01 NOTE — Progress Notes (Signed)
   THERAPIST PROGRESS NOTE  Session Time: 38:10FB-51:02HE  Participation Level: Active  Behavioral Response: Casual  Alert  Anxious  Type of Therapy: Individual Therapy  Treatment Goals addressed: Reduce GAD score to moderate or less, increase confidence in ability to manage anxiety without use of benzos  Interventions: Sleep Hygiene, Treatment plan review and update  Suicidal/Homicidal: Denied both  Therapist Interventions:   Checked on progress with implementing suggestions for reducing picking at her scalp.  Reminded her of the interventions they had talked about at her last appointment. Reviewed a worksheet with 15 tips for good sleep hygiene.   Reviewed and updated treatment plan.  Collaboratively decided on a goal to reduce overall feelings of anxiety and increase confidence in ability to manage anxiety symptoms without relying on a benzodiazepine.        Summary: Continues to pick at her scalp quite a bit.  Reported "It might be worse now than it was before."  Noted that she hasn't been able to fully implement some of the treatment strategies at this time because she is adjusting to the move from an apartment into a house.  Also noted experiencing some concerning physical symptoms in the past couple weeks.  When lifting boxes she was experiencing chest pain.  Scheduled to do a stress test this Thursday to rule out any medical causes.  Has also had some significant back pain so she had to rely on her husband to do most of the heavy lifting associated with moving.   Reported she hasn't been sleeping well.  Relying on Vistaril to help her fall asleep.  Still having trouble staying asleep.  Estimates most nights she is getting about 5 hours of sleep.  Indicated that for the most part she does abide by the suggestions for good sleep hygiene (limits caffeine, doesn't watch the clock, does focused breathing at bedtime, etc).  Acknowledges getting back to a regular exercise routine could be  beneficial.         Plan: Return in approximately one month. Treatment plan update is due September 01, 2017.  Diagnosis:  GAD MDD, recurrent, moderate Skin picking habit  Armandina Stammer 06/01/2017

## 2017-06-04 ENCOUNTER — Ambulatory Visit (INDEPENDENT_AMBULATORY_CARE_PROVIDER_SITE_OTHER): Payer: BLUE CROSS/BLUE SHIELD

## 2017-06-04 DIAGNOSIS — R0789 Other chest pain: Secondary | ICD-10-CM | POA: Diagnosis not present

## 2017-06-04 LAB — EXERCISE TOLERANCE TEST
CHL CUP MPHR: 159 {beats}/min
CHL CUP RESTING HR STRESS: 85 {beats}/min
CSEPEDS: 48 s
CSEPHR: 79 %
CSEPPHR: 127 {beats}/min
Estimated workload: 5.5 METS
Exercise duration (min): 3 min
RPE: 17

## 2017-06-05 NOTE — Progress Notes (Signed)
Call pt: great news. Stress test was normal. No evidence of any cardiac abnormality!

## 2017-06-07 ENCOUNTER — Other Ambulatory Visit: Payer: Self-pay | Admitting: Family Medicine

## 2017-06-07 DIAGNOSIS — R202 Paresthesia of skin: Secondary | ICD-10-CM

## 2017-06-09 ENCOUNTER — Ambulatory Visit (HOSPITAL_COMMUNITY): Payer: BLUE CROSS/BLUE SHIELD | Admitting: Psychiatry

## 2017-06-09 ENCOUNTER — Encounter (HOSPITAL_COMMUNITY): Payer: Self-pay | Admitting: Psychiatry

## 2017-06-09 VITALS — BP 108/70 | HR 89 | Ht <= 58 in | Wt 125.0 lb

## 2017-06-09 DIAGNOSIS — F331 Major depressive disorder, recurrent, moderate: Secondary | ICD-10-CM | POA: Diagnosis not present

## 2017-06-09 DIAGNOSIS — F5102 Adjustment insomnia: Secondary | ICD-10-CM | POA: Diagnosis not present

## 2017-06-09 DIAGNOSIS — F411 Generalized anxiety disorder: Secondary | ICD-10-CM

## 2017-06-09 DIAGNOSIS — Z818 Family history of other mental and behavioral disorders: Secondary | ICD-10-CM | POA: Diagnosis not present

## 2017-06-09 MED ORDER — CLONAZEPAM 0.5 MG PO TABS: 0.5000 mg | ORAL_TABLET | Freq: Every day | ORAL | 1 refills | Status: DC | PRN

## 2017-06-09 MED ORDER — ESCITALOPRAM OXALATE 10 MG PO TABS: 15.0000 mg | ORAL_TABLET | Freq: Every day | ORAL | 1 refills | Status: DC
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
## 2017-06-11 ENCOUNTER — Ambulatory Visit: Payer: BLUE CROSS/BLUE SHIELD | Admitting: Family Medicine

## 2017-06-11 ENCOUNTER — Encounter: Payer: Self-pay | Admitting: Family Medicine

## 2017-06-11 VITALS — BP 131/82 | HR 94 | Ht <= 58 in | Wt 126.0 lb

## 2017-06-11 DIAGNOSIS — M545 Low back pain, unspecified: Secondary | ICD-10-CM

## 2017-06-11 DIAGNOSIS — S39012A Strain of muscle, fascia and tendon of lower back, initial encounter: Secondary | ICD-10-CM

## 2017-06-11 DIAGNOSIS — M41125 Adolescent idiopathic scoliosis, thoracolumbar region: Secondary | ICD-10-CM

## 2017-06-11 DIAGNOSIS — G8929 Other chronic pain: Secondary | ICD-10-CM

## 2017-06-11 MED ORDER — LIDOCAINE 5 % EX PTCH: 1.0000 | MEDICATED_PATCH | CUTANEOUS | 12 refills | Status: DC

## 2017-06-11 NOTE — Progress Notes (Signed)
Leslie Roberson is a 62 y.o. female who presents to [facility] today for back pain. Leslie Roberson returns to clinic today with back pain. She has a history of scoliosis and severe DDD and facet DJD. She has chronic back pain and had ablations of L4/L5, L5/S1 facets BL in June of 2018. She notes that the baseline chronic pain was previously controlled and is returning. She would like to proceed with repeat ablations if able.   She also notes a 1 week history of new onset severe pain in her right low back. She has been unpacking recently and remembers pulling her back the day before the pain arrived. She denies any injury or radiating pain or weakness or numbness. She denies any bowel or bladder issues.    Past Medical History:  Diagnosis Date  . Anxiety   . Asthma, chronic 11/27/2015  . Depression   . Diverticulitis of colon 12/21/2015   Colonoscopy every 5 years/digestive health.   . GERD (gastroesophageal reflux disease) 11/27/2015  . Idiopathic scoliosis 01/22/2016  . Osteoporosis 11/27/2015   On prolia.   . Thyroid disease    Past Surgical History:  Procedure Laterality Date  . ABDOMINAL HYSTERECTOMY    . bladder mesh    . ELBOW SURGERY Right   . SPINAL FUSION    . TONSILLECTOMY    . TOTAL HIP ARTHROPLASTY     Social History   Tobacco Use  . Smoking status: Never Smoker  . Smokeless tobacco: Never Used  Substance Use Topics  . Alcohol use: Yes     ROS:  As above   Medications: Current Outpatient Medications  Medication Sig Dispense Refill  . albuterol (PROVENTIL HFA;VENTOLIN HFA) 108 (90 Base) MCG/ACT inhaler Inhale 1-2 puffs into the lungs every 4 (four) hours as needed for wheezing or shortness of breath. 1 Inhaler 3  . busPIRone (BUSPAR) 30 MG tablet TAKE 1 TABLET BY MOUTH TWICE A DAY 60 tablet 3  . clobetasol (TEMOVATE) 0.05 % external solution Apply 1 application topically 2 (two) times daily. 50 mL 1  . clonazePAM  (KLONOPIN) 0.5 MG tablet Take 1 tablet (0.5 mg total) by mouth daily as needed for anxiety. 30 tablet 1  . denosumab (PROLIA) 60 MG/ML SOLN injection Inject 60 mg into the skin every 6 (six) months. Administer in upper arm, thigh, or abdomen    . desvenlafaxine (PRISTIQ) 100 MG 24 hr tablet TAKE 1 TABLET BY MOUTH EVERY DAY 30 tablet 4  . desvenlafaxine (PRISTIQ) 50 MG 24 hr tablet TAKE ONE TABLET BY MOUTH DAILY 90 tablet 1  . dexlansoprazole (DEXILANT) 60 MG capsule TAKE 1 CAPSULE BY MOUTH EVERY DAY 90 capsule 0  . diclofenac sodium (VOLTAREN) 1 % GEL APPLY 4G TOPICALLY 4 TIMES DAILY 100 g 12  . escitalopram (LEXAPRO) 10 MG tablet Take 1.5 tablets (15 mg total) by mouth daily. 45 tablet 1  . flunisolide (NASAREL) 29 MCG/ACT (0.025%) nasal spray Place 2 sprays into the nose daily as needed for rhinitis. Dose is for each nostril.    Marland Kitchen gabapentin (NEURONTIN) 300 MG capsule TAKE 1 CAP AT BEDTIME X1 WEEK,1 CAP TWICE DAILY X1 WEEK THEN 1 CAP 3X DAILY MAY DOUBLE WEEKLY=3600MG  180 capsule 3  . HYDROcodone-acetaminophen (NORCO/VICODIN) 5-325 MG tablet Take 1 tablet by mouth every 8 (eight) hours as needed for moderate pain. After toenail removal. 10 tablet 0  . hydrOXYzine (ATARAX/VISTARIL) 25 MG tablet Take 1 tablet (25 mg total) by mouth  3 (three) times daily as needed. For chest tightness and/or scalp itching 30 tablet 1  . ipratropium (ATROVENT) 0.06 % nasal spray Place 2 sprays into both nostrils 4 (four) times daily. 15 mL 1  . levothyroxine (SYNTHROID, LEVOTHROID) 125 MCG tablet TAKE ONE-HALF TABLET BY MOUTH ONCE DAILY 30 tablet 1  . meloxicam (MOBIC) 15 MG tablet Take 15 mg by mouth daily. Pt states taking 1 tab daily.    . naproxen sodium (ANAPROX) 220 MG tablet Take 220 mg by mouth as needed.    . nortriptyline (PAMELOR) 25 MG capsule TAKE 1 CAPSULE (25 MG TOTAL) BY MOUTH AT BEDTIME 30 capsule 1  . Omega-3 Fatty Acids (FISH OIL PO) Take 1 tablet by mouth daily.    . traZODone (DESYREL) 50 MG tablet  TAKE 1 TABLET (50 MG TOTAL) BY MOUTH AT BEDTIME. 90 tablet 1  . triamcinolone ointment (KENALOG) 0.1 % Apply 1 application topically 2 (two) times daily. To affected area(s) as needed. Use no longer than 1-2 weeks. 30 g 0  . lidocaine (LIDODERM) 5 % Place 1 patch onto the skin daily. Remove & Discard patch within 12 hours or as directed by MD 30 patch 12   No current facility-administered medications for this visit.    Allergies  Allergen Reactions  . Levofloxacin Other (See Comments)    hallucinations  . Adhesive [Tape]   . Demerol [Meperidine] Nausea And Vomiting and Other (See Comments)    Doesn't work well for my pain.  . Dilaudid [Hydromorphone] Rash     Exam:  BP 131/82   Pulse 94   Ht 4\' 10"  (1.473 m)   Wt 126 lb (57.2 kg)   BMI 26.33 kg/m  General: Well Developed, well nourished, and in no acute distress.  Neuro/Psych: Alert and oriented x3, extra-ocular muscles intact, able to move all 4 extremities, sensation grossly intact. Skin: Warm and dry, no rashes noted.  Respiratory: Not using accessory muscles, speaking in full sentences, trachea midline.  Cardiovascular: Pulses palpable, no extremity edema. Abdomen: Does not appear distended. MSK: Lspine: Scoliosis present.  Non-tender to midline. TTP right SI area and right lumbar paraspinal muscles.  ROM limited in flexion by pain. Extension intact.  Rotation and lateral flexion also limited.  Lower extremity reflex, sensation and strength intact.     CLINICAL DATA:  Fall on back 08/23/2016.  Mid back pain.  EXAM: THORACOLUMBAR SPINE 1V  COMPARISON:  Chest x-ray 07/11/2016  FINDINGS: Severe rightward thoracolumbar scoliosis. No fracture subluxation. No change since prior study. Visualized lungs are clear.  IMPRESSION: Severe rightward thoracolumbar scoliosis. No acute bony abnormality.   Electronically Signed   By: Rolm Baptise M.D.   On: 09/01/2016 12:47 I personally (independently) visualized and  performed the interpretation of the images attached in this note.   Assessment and Plan: 62 y.o. female with  Acute Back Pain: New issue likely myofascial strain with spasm. Plan to treat with PT and recheck if not better. Use heating pad and TENS unit PRN. Use lidocaine patches as needed.   Chronic Back Pain: Great results in the past with Facet ablation.  Plan to repeat ablation. Use lidocaine patches as needed.   Recheck PRN.    Orders Placed This Encounter  Procedures  . Ambulatory referral to Physical Therapy    Referral Priority:   Routine    Referral Type:   Physical Medicine    Referral Reason:   Specialty Services Required    Requested Specialty:   Physical  Therapy   Meds ordered this encounter  Medications  . lidocaine (LIDODERM) 5 %    Sig: Place 1 patch onto the skin daily. Remove & Discard patch within 12 hours or as directed by MD    Dispense:  30 patch    Refill:  12    Use generic [employer] brand if able.    Discussed warning signs or symptoms. Please see discharge instructions. Patient expresses understanding.

## 2017-06-11 NOTE — Patient Instructions (Addendum)
Thank you for coming in today. Recheck with PT.  You should hear from [facility] soon about scheduling the ablation.  Let me know if you do not hear anything.   Apply the gel to the finger.  Use the splint as needed.   Recheck as needed.

## 2017-06-12 ENCOUNTER — Ambulatory Visit (HOSPITAL_COMMUNITY): Payer: Self-pay | Admitting: Psychiatry

## 2017-06-12 ENCOUNTER — Other Ambulatory Visit: Payer: Self-pay | Admitting: Physician Assistant

## 2017-06-13 ENCOUNTER — Other Ambulatory Visit (HOSPITAL_COMMUNITY): Payer: Self-pay | Admitting: Psychiatry

## 2017-06-16 ENCOUNTER — Ambulatory Visit (HOSPITAL_COMMUNITY): Payer: Self-pay | Admitting: Psychiatry

## 2017-06-17 ENCOUNTER — Encounter: Payer: Self-pay | Admitting: Rehabilitative and Restorative Service Providers"

## 2017-06-17 ENCOUNTER — Ambulatory Visit (INDEPENDENT_AMBULATORY_CARE_PROVIDER_SITE_OTHER): Payer: BLUE CROSS/BLUE SHIELD | Admitting: Rehabilitative and Restorative Service Providers"

## 2017-06-17 ENCOUNTER — Telehealth (HOSPITAL_COMMUNITY): Payer: Self-pay | Admitting: Psychiatry

## 2017-06-17 DIAGNOSIS — M545 Low back pain, unspecified: Secondary | ICD-10-CM

## 2017-06-17 DIAGNOSIS — R531 Weakness: Secondary | ICD-10-CM

## 2017-06-17 DIAGNOSIS — R29898 Other symptoms and signs involving the musculoskeletal system: Secondary | ICD-10-CM | POA: Diagnosis not present

## 2017-06-17 NOTE — Therapy (Signed)
[facility] [place], [place], [address] Phone: [phone]   Fax:  [phone]  Physical Therapy Evaluation  Patient Details  Name: Leslie Roberson MRN: [id number] Date of Birth: 1955/10/12 Referring Provider: Dr Lynne Leader    Encounter Date: 06/17/2017  PT End of Session - 06/17/17 1116    Visit Number  1    Number of Visits  12    Date for PT Re-Evaluation  07/29/17    PT Start Time  0930    PT Stop Time  1018    PT Time Calculation (min)  48 min    Activity Tolerance  Patient tolerated treatment well       Past Medical History:  Diagnosis Date  . Anxiety   . Asthma, chronic 11/27/2015  . Depression   . Diverticulitis of colon 12/21/2015   Colonoscopy every 5 years/digestive health.   . GERD (gastroesophageal reflux disease) 11/27/2015  . Idiopathic scoliosis 01/22/2016  . Osteoporosis 11/27/2015   On prolia.   . Thyroid disease     Past Surgical History:  Procedure Laterality Date  . ABDOMINAL HYSTERECTOMY    . bladder mesh    . ELBOW SURGERY Right   . SPINAL FUSION    . TONSILLECTOMY    . TOTAL HIP ARTHROPLASTY      There were no vitals filed for this visit.   Subjective Assessment - 06/17/17 0940    Subjective  Patient reports that she took a step forward with the Rt foot and felt a pull and pain in the Rt LB about 2 weeks ago. Pain has continued. She has some improvement but continues to have symptoms.     Pertinent History  scoliosis; spinal fusion ~ 1969/1970; Lt THA 1999; history of hip and back pain; chronic LBP - ablation scheduled with Dr Jola Baptist 07/03/17 (has had spinal ablations x 4 in past with good results) severe DDD; severe DJD L4/L5; L5/S1 facets     Diagnostic tests  xrays; MRI    Patient Stated Goals  get rid of pain     Currently in Pain?  Yes    Pain Score  7     Pain Location  Back    Pain Orientation  Right    Pain Descriptors / Indicators  Sharp    Pain Type   Acute pain;Chronic pain    Pain Onset  1 to 4 weeks ago    Pain Frequency  Constant    Aggravating Factors   moving the wrong way -     Pain Relieving Factors  heat; icy hot; TENS - mininal help          [facility] PT Assessment - 06/17/17 0001      Assessment   Medical Diagnosis  Rt lumbar pain     Referring Provider  Dr Lynne Leader     Onset Date/Surgical Date  05/31/17    Hand Dominance  Right    Next MD Visit  PRN     Prior Therapy  yes for hip and back pain       Precautions   Precautions  None      Balance Screen   Has the patient fallen in the past 6 months  No    Has the patient had a decrease in activity level because of a fear of falling?   No    Is the patient reluctant to leave their home because of a fear of falling?  No      Home Film/video editor residence    Living Arrangements  Spouse/significant other      Prior Function   Level of [place]  Retired;On disability    Leisure  household chores; exercises       Observation/Other Assessments   Focus on Therapeutic Outcomes (FOTO)   56% limitation       Sensation   Additional Comments  WNL's per pt report       Posture/Postural Control   Posture Comments  scoliosis Rt convex thoracic; Lt conves lumbar; rotation       AROM   AROM Assessment Site  -- some tightness and discomfort throughout with lumbar motions    Lumbar Flexion  65%    Lumbar Extension  25%    Lumbar - Right Side Bend  5% discomfort Rt LB    Lumbar - Left Side Bend  25%    Lumbar - Right Rotation  10%    Lumbar - Left Rotation  35%      Strength   Overall Strength Comments  grossly 5/5 bilat LE's       Flexibility   Hamstrings  WFL    Quadriceps  WFL    ITB  WFL    Piriformis  WFL      Palpation   Spinal mobility  severe spinal curviture due to scoliosis - spinal fusion     SI assessment   asymmterical     Palpation comment  muscular tightness thoracolumbar musculature; QL;  piriformis; hip flexors Rt > Lt              Objective measurements completed on examination: See above findings.      [facility] Adult PT Treatment/Exercise - 06/17/17 0001      Moist Heat Therapy   Number Minutes Moist Heat  20 Minutes    Moist Heat Location  Lumbar Spine      Electrical Stimulation   Electrical Stimulation Location  Rt lumbar/SI area     Electrical Stimulation Action  IFC    Electrical Stimulation Parameters  to tolerance    Electrical Stimulation Goals  Pain;Tone      Manual Therapy   Manual therapy comments  pt prone     Soft tissue mobilization  soft tissue work Rt lumbar/QL/SI area     Myofascial Release  Rt lumbar/sacral area              PT Education - 06/17/17 1013    Education provided  Yes    Education Details  patient encouraged to resume previous exercises     Person(s) Educated  Patient    Methods  Explanation    Comprehension  Verbalized understanding          PT Long Term Goals - 06/17/17 1130      PT LONG TERM GOAL #1   Title  independent with HEP (07/29/17)    Time  6    Period  Weeks    Status  New      PT LONG TERM GOAL #2   Title  improve lumbar spine mobility by 10-20% throughout  (07/29/17)    Time  6    Period  Weeks    Status  New      PT LONG TERM GOAL #3   Title  report ability to walk > 15 min without increase in pain for improved activity tolerance and  decreased pain (07/29/17)    Time  6    Period  Weeks    Status  New      PT LONG TERM GOAL #4   Title  improve FOTO to </= 42% limitation (07/29/17)    Time  6    Period  Weeks    Status  New      PT LONG TERM GOAL #5   Title  decrease pain in Rt hip by 50-75% allowing patient to return to normal functional activitiy level (07/29/17)    Time  [place] - 06/17/17 1119    Clinical Impression Statement  Raelan Burgoon presents with Rt lumbosacral pain likely related to lifting and moving during the last few  weeks. She has a history of chronic LBP and hip pain. She presents today with decresaed lumbar mobility; muscular tightness to palpation and pain with functional activities. She will benefit from PT to address problems identified.     History and Personal Factors relevant to plan of care:  chronic LBP; thoracolumbar fusions at multiple leves for scoliosis; spinal ablations x 4 on past; THA    Clinical Presentation  Evolving    Clinical Decision Making  Low    Rehab Potential  Good    PT Frequency  2x / week    PT Duration  6 weeks    PT Treatment/Interventions  Patient/family education;ADLs/Self Care Home Management;Cryotherapy;Electrical Stimulation;Iontophoresis 4mg /ml Dexamethasone;Ultrasound;Dry needling;Manual techniques;Neuromuscular re-education;Therapeutic activities;Therapeutic exercise    PT Next Visit Plan  trial of DN for lumbar spine; manual work; ther exercise; modalities as indicated     Consulted and Agree with Plan of Care  Patient       Patient will benefit from skilled therapeutic intervention in order to improve the following deficits and impairments:  Postural dysfunction, Improper body mechanics, Pain, Increased fascial restricitons, Increased muscle spasms, Hypomobility, Decreased mobility, Decreased range of motion, Decreased activity tolerance  Visit Diagnosis: Acute right-sided low back pain without sciatica - Plan: PT plan of care cert/re-cert  Other symptoms and signs involving the musculoskeletal system - Plan: PT plan of care cert/re-cert  Weakness generalized - Plan: PT plan of care cert/re-cert     Problem List Patient Active Problem List   Diagnosis Date Noted  . Atypical chest pain 05/24/2017  . Itchy scalp 05/24/2017  . Right leg pain 02/06/2017  . Chronic tension-type headache, intractable 02/05/2017  . Prediabetes 02/05/2017  . Lump in throat 01/27/2017  . Dysphagia 01/27/2017  . No energy 09/15/2016  . Bad taste in mouth 07/26/2016  .  Paresthesia 04/03/2016  . Facet hypertrophy of lumbosacral region 04/03/2016  . Chronic back pain 04/03/2016  . Right knee pain 02/19/2016  . Rosacea 02/04/2016  . Vaginal atrophy 01/31/2016  . Idiopathic scoliosis 01/22/2016  . Hip bursitis 12/25/2015  . Biceps tendonitis on right 12/25/2015  . Diverticulitis of colon 12/21/2015  . GERD (gastroesophageal reflux disease) 11/27/2015  . Osteoporosis 11/27/2015  . GAD (generalized anxiety disorder) 11/27/2015  . MDD (major depressive disorder), recurrent, in full remission ([place]) 11/27/2015  . Chronic dryness of both eyes 11/27/2015  . Hypothyroidism 11/27/2015  . Asthma, chronic 11/27/2015  . Insomnia 11/27/2015  . Seborrheic keratoses 11/27/2015    Ziomara Birenbaum Nilda Simmer PT, MPH  06/17/2017, 11:35 AM  [facility] [place], [place],  [place] Phone: [phone]   Fax:  [phone]  Name: Madiha Bambrick Rabinovich MRN: [id number] Date of Birth: [birth year]

## 2017-06-17 NOTE — Telephone Encounter (Signed)
Pt states she is not feeling any better, and actually feels like she is worse.  She is not sleeping well at all and would like something for this.  Please advise   CB [phone]

## 2017-06-19 NOTE — Telephone Encounter (Addendum)
I called to informed patient of what Dr. De Nurse stated in the previous message about the sleep center and sleep hygiene. Patient states that she has already had a sleep study done in the past and they did not find anything wrong. Patient said that she cut her morning dose of  Buspar in half and takes it at night to help with sleep. Patient also wanted Dr. De Nurse to know that she has back pain from her scoliosis that causes her to lose sleep as well. Nothing further is needed at this time.

## 2017-06-19 NOTE — Telephone Encounter (Signed)
Crystal can you give her a call. She is already taking klonopine at night for sleep. If she has ongoing concerns we can refer for to a sleep center .  Also to continue to work on sleep hygiene for now

## 2017-06-21 ENCOUNTER — Other Ambulatory Visit: Payer: Self-pay | Admitting: Physician Assistant

## 2017-06-21 DIAGNOSIS — L299 Pruritus, unspecified: Secondary | ICD-10-CM

## 2017-06-22 ENCOUNTER — Telehealth (HOSPITAL_COMMUNITY): Payer: Self-pay | Admitting: Psychiatry

## 2017-06-22 NOTE — Telephone Encounter (Signed)
Pt left a Voicemail yesterday and would like to speak with De Nurse. Pt states she is extremely depressed and would like to know what he reccommended.

## 2017-06-23 ENCOUNTER — Ambulatory Visit: Payer: BLUE CROSS/BLUE SHIELD | Admitting: Physician Assistant

## 2017-06-23 ENCOUNTER — Telehealth: Payer: Self-pay | Admitting: *Deleted

## 2017-06-23 ENCOUNTER — Ambulatory Visit: Payer: BLUE CROSS/BLUE SHIELD | Admitting: Rehabilitative and Restorative Service Providers"

## 2017-06-23 ENCOUNTER — Encounter: Payer: Self-pay | Admitting: Rehabilitative and Restorative Service Providers"

## 2017-06-23 ENCOUNTER — Encounter: Payer: Self-pay | Admitting: Physician Assistant

## 2017-06-23 VITALS — BP 126/73 | HR 78 | Ht <= 58 in | Wt 125.0 lb

## 2017-06-23 DIAGNOSIS — R29898 Other symptoms and signs involving the musculoskeletal system: Secondary | ICD-10-CM | POA: Diagnosis not present

## 2017-06-23 DIAGNOSIS — R531 Weakness: Secondary | ICD-10-CM | POA: Diagnosis not present

## 2017-06-23 DIAGNOSIS — M25551 Pain in right hip: Secondary | ICD-10-CM

## 2017-06-23 DIAGNOSIS — K2100 Gastro-esophageal reflux disease with esophagitis, without bleeding: Secondary | ICD-10-CM

## 2017-06-23 DIAGNOSIS — M545 Low back pain, unspecified: Secondary | ICD-10-CM

## 2017-06-23 DIAGNOSIS — R0789 Other chest pain: Secondary | ICD-10-CM

## 2017-06-23 MED ORDER — PANTOPRAZOLE SODIUM 40 MG PO TBEC: 40.0000 mg | DELAYED_RELEASE_TABLET | Freq: Every day | ORAL | 1 refills | Status: DC

## 2017-06-23 NOTE — Therapy (Signed)
[facility] [place], [place], [address] Phone: [phone]   Fax:  [phone]  Physical Therapy Treatment  Patient Details  Name: Leslie Roberson MRN: [id number] Date of Birth: [birth year] Referring Provider: Dr Lynne Leader    Encounter Date: 06/23/2017  PT End of Session - 06/23/17 1150    Visit Number  2    Number of Visits  12    Date for PT Re-Evaluation  07/29/17    PT Start Time  3664    PT Stop Time  4034    PT Time Calculation (min)  59 min    Activity Tolerance  Patient tolerated treatment well       Past Medical History:  Diagnosis Date  . Anxiety   . Asthma, chronic 11/27/2015  . Depression   . Diverticulitis of colon 12/21/2015   Colonoscopy every 5 years/digestive health.   . GERD (gastroesophageal reflux disease) 11/27/2015  . Idiopathic scoliosis 01/22/2016  . Osteoporosis 11/27/2015   On prolia.   . Thyroid disease     Past Surgical History:  Procedure Laterality Date  . ABDOMINAL HYSTERECTOMY    . bladder mesh    . ELBOW SURGERY Right   . SPINAL FUSION    . TONSILLECTOMY    . TOTAL HIP ARTHROPLASTY      There were no vitals filed for this visit.  Subjective Assessment - 06/23/17 1151    Subjective  Patient reports that she had improvement in LBP following initial treatment but symptoms started increasing again Sunday and worse yesterday. She is using the TENS and heat at home.     Currently in Pain?  Yes    Pain Score  8     Pain Location  Back    Pain Orientation  Right    Pain Descriptors / Indicators  Tightness;Aching    Pain Type  Acute pain;Chronic pain    Pain Onset  1 to 4 weeks ago    Pain Frequency  Constant                No data recorded       [facility] Adult PT Treatment/Exercise - 06/23/17 0001      Lumbar Exercises: Stretches   Double Knee to Chest Stretch  3 reps;30 seconds    Piriformis Stretch  Right;3 reps;30 seconds supine travell    Other Lumbar Stretch Exercise  sitting with lateral trunk flexion over bolster for 3 reps of 30 sec hold       Moist Heat Therapy   Number Minutes Moist Heat  20 Minutes    Moist Heat Location  Lumbar Spine      Electrical Stimulation   Electrical Stimulation Location  Rt lumbar/SI area     Electrical Stimulation Action  IFC    Electrical Stimulation Parameters  to tolerance     Electrical Stimulation Goals  Pain;Tone      Manual Therapy   Manual therapy comments  pt prone     Soft tissue mobilization  soft tissue work Rt lumbar/QL/SI area     Myofascial Release  Rt lumbar/sacral area        Trigger Point Dry Needling - 06/23/17 1216    Consent Given?  Yes    Education Handout Provided  Yes    Muscles Treated Lower Body  -- Rt QL and lumbar paraspinals; thoracolumbar fascia w/estim     Longissimus Response  Palpable increased muscle length    Gluteus  Maximus Response  Palpable increased muscle length    Piriformis Response  Palpable increased muscle length           PT Education - 06/23/17 1233    Education provided  Yes    Education Details  HEP     Person(s) Educated  Patient    Methods  Explanation;Demonstration;Tactile cues;Verbal cues;Handout    Comprehension  Verbalized understanding;Returned demonstration;Tactile cues required;Verbal cues required          PT Long Term Goals - 06/23/17 1150      PT LONG TERM GOAL #1   Title  independent with HEP (07/29/17)    Time  6    Period  Weeks    Status  On-going      PT LONG TERM GOAL #2   Title  improve lumbar spine mobility by 10-20% throughout  (07/29/17)    Time  6    Period  Weeks    Status  On-going      PT LONG TERM GOAL #3   Title  report ability to walk > 15 min without increase in pain for improved activity tolerance and decreased pain (07/29/17)    Time  6    Period  Weeks    Status  On-going      PT LONG TERM GOAL #4   Title  improve FOTO to </= 42% limitation (07/29/17)    Time  6    Period   Weeks    Status  On-going      PT LONG TERM GOAL #5   Title  decrease pain in Rt hip by 50-75% allowing patient to return to normal functional activitiy level (07/29/17)    Time  6    Period  Weeks    Status  On-going            Plan - 06/23/17 1153    Clinical Impression Statement  Patient had good improvement following initial treatment but symptoms have increased again. She responded well to exercise and manual work in clinic today. No goals accomplished. Only second visit.     Rehab Potential  Good    PT Frequency  2x / week    PT Treatment/Interventions  Patient/family education;ADLs/Self Care Home Management;Cryotherapy;Electrical Stimulation;Iontophoresis 4mg /ml Dexamethasone;Ultrasound;Dry needling;Manual techniques;Neuromuscular re-education;Therapeutic activities;Therapeutic exercise    PT Next Visit Plan  trial of DN for lumbar spine; manual work; ther exercise; modalities as indicated     Consulted and Agree with Plan of Care  Patient       Patient will benefit from skilled therapeutic intervention in order to improve the following deficits and impairments:  Postural dysfunction, Improper body mechanics, Pain, Increased fascial restricitons, Increased muscle spasms, Hypomobility, Decreased mobility, Decreased range of motion, Decreased activity tolerance  Visit Diagnosis: Acute right-sided low back pain without sciatica  Other symptoms and signs involving the musculoskeletal system  Weakness generalized  Pain of right hip joint     Problem List Patient Active Problem List   Diagnosis Date Noted  . Atypical chest pain 05/24/2017  . Itchy scalp 05/24/2017  . Right leg pain 02/06/2017  . Chronic tension-type headache, intractable 02/05/2017  . Prediabetes 02/05/2017  . Lump in throat 01/27/2017  . Dysphagia 01/27/2017  . No energy 09/15/2016  . Bad taste in mouth 07/26/2016  . Paresthesia 04/03/2016  . Facet hypertrophy of lumbosacral region 04/03/2016  .  Chronic back pain 04/03/2016  . Right knee pain 02/19/2016  . Rosacea 02/04/2016  . Vaginal atrophy 01/31/2016  .  Idiopathic scoliosis 01/22/2016  . Hip bursitis 12/25/2015  . Biceps tendonitis on right 12/25/2015  . Diverticulitis of colon 12/21/2015  . GERD (gastroesophageal reflux disease) 11/27/2015  . Osteoporosis 11/27/2015  . GAD (generalized anxiety disorder) 11/27/2015  . MDD (major depressive disorder), recurrent, in full remission ([place]) 11/27/2015  . Chronic dryness of both eyes 11/27/2015  . Hypothyroidism 11/27/2015  . Asthma, chronic 11/27/2015  . Insomnia 11/27/2015  . Seborrheic keratoses 11/27/2015    Shareena Nusz Nilda Simmer PT, MPH  06/23/2017, 12:35 PM  [facility] [place], [place], [address] Phone: [phone]   Fax:  [phone]  Name: Leslie Roberson MRN: [id number] Date of Birth: September 07, 1955

## 2017-06-23 NOTE — Telephone Encounter (Signed)
Have called. Was dysphoric over the weekend. Trying to work on the stressors. Have asked to increase lexapro to 20mg  for now. Call or report to ED for concerns if needed

## 2017-06-23 NOTE — Telephone Encounter (Signed)
Can we look into PA on Dexilant and or if there is patient assistance out there. She has tried omeprazole, nexium, zantac with no benefit. GI recently put her on dexilant and helping a lot with GERD.

## 2017-06-23 NOTE — Patient Instructions (Signed)
    Gently pull both knees toward chest. Feel stretch in lower back or buttock area. Breathing deeply, Hold _30___ seconds. Repeat __3__ times. Do _1-2___ sessions per day.   Sitting on bed or sofa leaning over a pillow to the left side  30 sec x 3 reps 1-2 times/day

## 2017-06-23 NOTE — Progress Notes (Signed)
Subjective:    Patient ID: Leslie Roberson, female    DOB: [birth year], 62 y.o.   MRN: [id number]  HPI Patient is a 62 year old female who presents today with a complaint of continued chest pain. She started having chest pains about a month ago, she followed up with a stress test which did not show any abnormalities. Since the stress test she has been having exertional chest pain that is substernal in nature with associated shortness of breath and the feeling that her heart is racing. She denies any nausea, vomiting, diaphoresis, radiation of pain. She states that when she sits down her pain is alleviated within 2-3 minutes, and she does not have any episodes without exertion. She denies any tenderness to palpation, no trauma. She is going through some stress related to moving (but is improved from stress about a month ago), she is also having some medication changes made with Klonopin and Lexapro. (Klonopin was decreased down to 0.25 mg which she did not tolerate and she is attempting to be off it completely so that has stressed her out.   .. Active Ambulatory Problems    Diagnosis Date Noted  . GERD (gastroesophageal reflux disease) 11/27/2015  . Osteoporosis 11/27/2015  . GAD (generalized anxiety disorder) 11/27/2015  . MDD (major depressive disorder), recurrent, in full remission ([place]) 11/27/2015  . Chronic dryness of both eyes 11/27/2015  . Hypothyroidism 11/27/2015  . Asthma, chronic 11/27/2015  . Insomnia 11/27/2015  . Seborrheic keratoses 11/27/2015  . Diverticulitis of colon 12/21/2015  . Hip bursitis 12/25/2015  . Biceps tendonitis on right 12/25/2015  . Idiopathic scoliosis 01/22/2016  . Vaginal atrophy 01/31/2016  . Rosacea 02/04/2016  . Right knee pain 02/19/2016  . Paresthesia 04/03/2016  . Facet hypertrophy of lumbosacral region 04/03/2016  . Chronic back pain 04/03/2016  . Bad taste in mouth 07/26/2016  . No energy 09/15/2016  . Lump in throat 01/27/2017  .  Dysphagia 01/27/2017  . Chronic tension-type headache, intractable 02/05/2017  . Prediabetes 02/05/2017  . Right leg pain 02/06/2017  . Atypical chest pain 05/24/2017  . Itchy scalp 05/24/2017   Resolved Ambulatory Problems    Diagnosis Date Noted  . Acute medial meniscus tear 04/23/2016  . Left knee pain 04/28/2016  . Mild intermittent asthma without complication 59/56/3875   Past Medical History:  Diagnosis Date  . Anxiety   . Asthma, chronic 11/27/2015  . Depression   . Diverticulitis of colon 12/21/2015  . GERD (gastroesophageal reflux disease) 11/27/2015  . Idiopathic scoliosis 01/22/2016  . Osteoporosis 11/27/2015  . Thyroid disease       Review of Systems  Constitutional: Negative for activity change and diaphoresis.  Respiratory: Positive for chest tightness and shortness of breath.   Cardiovascular: Positive for chest pain and palpitations.  Gastrointestinal: Negative for abdominal pain, nausea and vomiting.  Musculoskeletal: Negative for back pain and neck pain.  Neurological: Negative for dizziness and headaches.  Psychiatric/Behavioral: Positive for behavioral problems (depression). Negative for suicidal ideas.       Objective:   Physical Exam  Constitutional: She is oriented to person, place, and time. She appears well-developed and well-nourished. No distress.  HENT:  Head: Normocephalic and atraumatic.  Right Ear: External ear normal.  Left Ear: External ear normal.  Eyes: Conjunctivae are normal.  Neck: Normal range of motion. No JVD present. No thyromegaly present.  Cardiovascular: Normal rate, regular rhythm, normal heart sounds and intact distal pulses.  No murmur heard. No carotid bruit  Pulmonary/Chest: Effort normal and breath sounds normal. No respiratory distress. She has no wheezes. She exhibits no tenderness.  Neurological: She is alert and oriented to person, place, and time.  Skin: Skin is warm and dry. She is not diaphoretic.  Psychiatric:  She has a normal mood and affect. Her behavior is normal.  Nursing note and vitals reviewed.         Assessment & Plan:  Marland KitchenMarland KitchenDiagnoses and all orders for this visit:  Atypical chest pain -     Ambulatory referral to Cardiology  Gastroesophageal reflux disease with esophagitis -     pantoprazole (PROTONIX) 40 MG tablet; Take 1 tablet (40 mg total) by mouth daily.   Unclear what is causing this atypical exertional chest pain. Stress test was normal. GERD is controlled with dexilant and recent EGD 01/2017. Pain does not appear to be musculoskeletal. Pt is worried about this. I will send to cardiology for further reassurance. Discussed some poor exercise tolerance but I don't really think could be causing the pain but certainly the SOB.   Will send info to PA coordinator to see if can get dexilant approved. In meantime try protonix.

## 2017-06-23 NOTE — Progress Notes (Deleted)
dexilant- nexium omprezaole zantac

## 2017-06-23 NOTE — Telephone Encounter (Signed)
Dr. De Nurse, I spoke to Staley. This was a mistake. Please diregard her info.  Please advise. See previous message.

## 2017-06-24 ENCOUNTER — Encounter: Payer: Self-pay | Admitting: Cardiology

## 2017-06-24 ENCOUNTER — Ambulatory Visit: Payer: Self-pay | Admitting: Cardiology

## 2017-06-24 ENCOUNTER — Ambulatory Visit: Payer: BLUE CROSS/BLUE SHIELD | Admitting: Cardiology

## 2017-06-24 ENCOUNTER — Other Ambulatory Visit: Payer: Self-pay | Admitting: *Deleted

## 2017-06-24 ENCOUNTER — Telehealth: Payer: Self-pay | Admitting: Physician Assistant

## 2017-06-24 VITALS — BP 122/81 | HR 98 | Ht <= 58 in | Wt 126.4 lb

## 2017-06-24 DIAGNOSIS — I2089 Other forms of angina pectoris: Secondary | ICD-10-CM

## 2017-06-24 DIAGNOSIS — I200 Unstable angina: Secondary | ICD-10-CM

## 2017-06-24 DIAGNOSIS — I208 Other forms of angina pectoris: Secondary | ICD-10-CM | POA: Diagnosis not present

## 2017-06-24 DIAGNOSIS — L299 Pruritus, unspecified: Secondary | ICD-10-CM

## 2017-06-24 MED ORDER — HYDROXYZINE HCL 25 MG PO TABS: 25.0000 mg | ORAL_TABLET | Freq: Three times a day (TID) | ORAL | 1 refills | Status: DC | PRN

## 2017-06-24 NOTE — Telephone Encounter (Signed)
Patient will come in office on 06/25/2017 to pickup discount card.

## 2017-06-24 NOTE — Telephone Encounter (Signed)
Received fax from [facility] that [place] was approved from 06/24/2017 until 06/24/2018. Pharmacy notified and forms sent to scan.   Reference ID:  [id number].

## 2017-06-24 NOTE — Patient Instructions (Signed)
Medication Instructions:  Your physician recommends that you continue on your current medications as directed. Please refer to the Current Medication list given to you today.    Labwork: Your physician recommends that you have lab work today: bmet/cbc/pt/ptt/inr   Testing/Procedures:  Your provider has recommended a cardiac catherization  You are scheduled for a cardiac catheterization on Friday, March 29 with Dr. Saunders Revel or associate.  Please arrive at the [facility] (Main Entrance) at [facility] at [address] [place] on Friday, March 29 at 10:00 am .    Special note: Every effort is made to have your procedure done on time.   Please understand that emergencies sometimes delay a scheduled   procedure.  No food or drink after midnight on Thursday, March 28.   You may take your morning medications with a sip of water on the day of your procedure.  Please take a baby aspirin (81 mg) on the morning of your procedure.     Plan for a one night stay -- bring personal belongings.  Bring a current list of your medications and current insurance cards.  You MUST have a responsible person to drive you home. Someone MUST be with you the first 24 hours after you arrive home or your discharge will be delayed. Wear clothes that are easy to get on and off and wear slip on shoes.    Coronary Angiogram A coronary angiogram, also called coronary angiography, is an X-ray procedure used to look at the arteries in the heart. In this procedure, a dye (contrast dye) is injected through a long, hollow tube (catheter). The catheter is about the size of a piece of cooked spaghetti and is inserted through your groin, wrist, or arm. The dye is injected into each artery, and X-rays are then taken to show if there is a blockage in the arteries of your heart.  LET [facility] CARE PROVIDER KNOW ABOUT:  Any allergies you have, including allergies to shellfish or contrast  dye.   All medicines you are taking, including vitamins, herbs, eye drops, creams, and over-the-counter medicines.   Previous problems you or members of your family have had with the use of anesthetics.   Any blood disorders you have.   Previous surgeries you have had.  History of kidney problems or failure.   Other medical conditions you have.  RISKS AND COMPLICATIONS  Generally, a coronary angiogram is a safe procedure. However, about 1 person out of 1000 can have problems that may include:  Allergic reaction to the dye.  Bleeding/bruising from the access site or other locations.  Kidney injury, especially in people with impaired kidney function.  Stroke (rare).  Heart attack (rare).  Irregular rhythms (rare)  Death (rare)  BEFORE THE PROCEDURE   Do not eat or drink anything after midnight the night before the procedure or as directed by your health care provider.   Ask your health care provider about changing or stopping your regular medicines. This is especially important if you are taking diabetes medicines or blood thinners.  PROCEDURE  You may be given a medicine to help you relax (sedative) before the procedure. This medicine is given through an intravenous (IV) access tube that is inserted into one of your veins.   The area where the catheter will be inserted will be washed and shaved. This is usually done in the groin but may be done in the fold of your arm (near your  elbow) or in the wrist.   A medicine will be given to numb the area where the catheter will be inserted (local anesthetic).   The health care provider will insert the catheter into an artery. The catheter will be guided by using a special type of X-ray (fluoroscopy) of the blood vessel being examined.   A special dye will then be injected into the catheter, and X-rays will be taken. The dye will help to show where any narrowing or blockages are located in the heart arteries.     AFTER THE PROCEDURE   If the procedure is done through the leg, you will be kept in bed lying flat for several hours. You will be instructed to not bend or cross your legs.  The insertion site will be checked frequently.   The pulse in your feet or wrist will be checked frequently.   Additional blood tests, X-rays, and an electrocardiogram may be done.     Follow-Up: Your physician recommends that you keep your scheduled follow-up appointment with Dr.Crenshaw.    Any Other Special Instructions Will Be Listed Below (If Applicable).     If you need a refill on your cardiac medications before your next appointment, please call your pharmacy.  2

## 2017-06-24 NOTE — Telephone Encounter (Signed)
We will be prescribing dexilant and/or any other GERD medication.

## 2017-06-24 NOTE — Telephone Encounter (Signed)
Is Dr. Primitivo Gauze going to give this medicine or do I need to work on a PA? Please advise.

## 2017-06-24 NOTE — Telephone Encounter (Signed)
Opened in error

## 2017-06-24 NOTE — Progress Notes (Signed)
Referring-Jade Breeback PA-C Reason for referral-chest pain  HPI: 62 year old female for evaluation of chest pain at request of Iran Planas PA-C.  Exercise treadmill June 04, 2017 showed poor exercise tolerance but no ST changes to suggest ischemia.  Note patient only exercised 3 minutes and 48 seconds and percent predicted heart rate was 79%.  Since mid February the patient has noticed substernal chest tightness with exertion relieved with rest.  This occurs with more vigorous activities but not routine activities.  There is associated dyspnea but no nausea or diaphoresis.  The pain is not pleuritic or positional.  No symptoms at rest.  She did have symptoms while on the treadmill.  Because of the above cardiology asked to evaluate.  Otherwise no orthopnea, PND, pedal edema or syncope.  Current Outpatient Medications  Medication Sig Dispense Refill  . albuterol (PROVENTIL HFA;VENTOLIN HFA) 108 (90 Base) MCG/ACT inhaler Inhale 1-2 puffs into the lungs every 4 (four) hours as needed for wheezing or shortness of breath. 1 Inhaler 3  . busPIRone (BUSPAR) 30 MG tablet TAKE 1 TABLET BY MOUTH TWICE A DAY (Patient taking differently: Take .5 tab in the morning and 1.5 tabs in the evening) 60 tablet 3  . clobetasol (TEMOVATE) 0.05 % external solution Apply 1 application topically 2 (two) times daily. 50 mL 1  . clonazePAM (KLONOPIN) 0.5 MG tablet Take 1 tablet (0.5 mg total) by mouth daily as needed for anxiety. 30 tablet 1  . denosumab (PROLIA) 60 MG/ML SOLN injection Inject 60 mg into the skin every 6 (six) months. Administer in upper arm, thigh, or abdomen    . desvenlafaxine (PRISTIQ) 100 MG 24 hr tablet TAKE 1 TABLET BY MOUTH EVERY DAY 30 tablet 4  . desvenlafaxine (PRISTIQ) 50 MG 24 hr tablet TAKE ONE TABLET BY MOUTH DAILY 90 tablet 1  . dexlansoprazole (DEXILANT) 60 MG capsule TAKE 1 CAPSULE BY MOUTH EVERY DAY 90 capsule 3  . diclofenac sodium (VOLTAREN) 1 % GEL APPLY 4G TOPICALLY 4 TIMES DAILY  100 g 12  . escitalopram (LEXAPRO) 10 MG tablet Take 20 mg by mouth daily.    . flunisolide (NASAREL) 29 MCG/ACT (0.025%) nasal spray Place 2 sprays into the nose daily as needed for rhinitis. Dose is for each nostril.    Marland Kitchen gabapentin (NEURONTIN) 300 MG capsule TAKE 1 CAP AT BEDTIME X1 WEEK,1 CAP TWICE DAILY X1 WEEK THEN 1 CAP 3X DAILY MAY DOUBLE WEEKLY=3600MG  180 capsule 3  . HYDROcodone-acetaminophen (NORCO/VICODIN) 5-325 MG tablet Take 1 tablet by mouth every 8 (eight) hours as needed for moderate pain. After toenail removal. 10 tablet 0  . hydrOXYzine (ATARAX/VISTARIL) 25 MG tablet Take 1 tablet (25 mg total) by mouth 3 (three) times daily as needed. For chest tightness and/or scalp itching 30 tablet 1  . ipratropium (ATROVENT) 0.06 % nasal spray Place 2 sprays into both nostrils 4 (four) times daily. 15 mL 1  . levothyroxine (SYNTHROID, LEVOTHROID) 125 MCG tablet TAKE ONE-HALF TABLET BY MOUTH ONCE DAILY 30 tablet 1  . lidocaine (LIDODERM) 5 % Place 1 patch onto the skin daily. Remove & Discard patch within 12 hours or as directed by MD 30 patch 12  . meloxicam (MOBIC) 15 MG tablet Take 15 mg by mouth daily. Pt states taking 1 tab daily.    . naproxen sodium (ANAPROX) 220 MG tablet Take 220 mg by mouth as needed.    . nortriptyline (PAMELOR) 25 MG capsule TAKE 1 CAPSULE (25 MG TOTAL) BY MOUTH AT  BEDTIME 30 capsule 1  . Omega-3 Fatty Acids (FISH OIL PO) Take 1 tablet by mouth daily.    . pantoprazole (PROTONIX) 40 MG tablet Take 1 tablet (40 mg total) by mouth daily. 90 tablet 1  . traZODone (DESYREL) 50 MG tablet TAKE 1 TABLET (50 MG TOTAL) BY MOUTH AT BEDTIME. 90 tablet 1  . triamcinolone ointment (KENALOG) 0.1 % Apply 1 application topically 2 (two) times daily. To affected area(s) as needed. Use no longer than 1-2 weeks. 30 g 0   No current facility-administered medications for this visit.     Allergies  Allergen Reactions  . Hydromorphone Rash  . Levofloxacin Other (See Comments)     hallucinations hallucinations  . Meperidine Nausea And Vomiting and Other (See Comments)    Doesn't work well for my pain. Doesn't work well for my pain.  . Other Other (See Comments)  . Adhesive [Tape]      Past Medical History:  Diagnosis Date  . Anxiety   . Asthma, chronic 11/27/2015  . Depression   . Diverticulitis of colon 12/21/2015   Colonoscopy every 5 years/digestive health.   . GERD (gastroesophageal reflux disease) 11/27/2015  . Idiopathic scoliosis 01/22/2016  . Osteoporosis 11/27/2015   On prolia.   . Thyroid disease     Past Surgical History:  Procedure Laterality Date  . ABDOMINAL HYSTERECTOMY    . bladder mesh    . ELBOW SURGERY Right   . SPINAL FUSION    . TONSILLECTOMY    . TOTAL HIP ARTHROPLASTY      Social History   Socioeconomic History  . Marital status: Married    Spouse name: Not on file  . Number of children: 1  . Years of education: Not on file  . Highest education level: Not on file  Occupational History  . Not on file  Social Needs  . Financial resource strain: Not on file  . Food insecurity:    Worry: Not on file    Inability: Not on file  . Transportation needs:    Medical: Not on file    Non-medical: Not on file  Tobacco Use  . Smoking status: Never Smoker  . Smokeless tobacco: Never Used  Substance and Sexual Activity  . Alcohol use: Yes    Comment: Occasional  . Drug use: No  . Sexual activity: Yes  Lifestyle  . Physical activity:    Days per week: Not on file    Minutes per session: Not on file  . Stress: Not on file  Relationships  . Social connections:    Talks on phone: Not on file    Gets together: Not on file    Attends religious service: Not on file    Active member of club or organization: Not on file    Attends meetings of clubs or organizations: Not on file    Relationship status: Not on file  . Intimate partner violence:    Fear of current or ex partner: Not on file    Emotionally abused: Not on file     Physically abused: Not on file    Forced sexual activity: Not on file  Other Topics Concern  . Not on file  Social History Narrative  . Not on file    Family History  Problem Relation Age of Onset  . Cancer Mother        lung  . Depression Mother   . Heart attack Father   . Hyperlipidemia Father   .  Hypertension Father   . Diabetes Maternal Aunt   . Hyperlipidemia Paternal Aunt   . COPD Paternal Aunt     ROS: no fevers or chills, productive cough, hemoptysis, dysphasia, odynophagia, melena, hematochezia, dysuria, hematuria, rash, seizure activity, orthopnea, PND, pedal edema, claudication. Remaining systems are negative.  Physical Exam:   Blood pressure 122/81, pulse 98, height 4\' 10"  (1.473 m), weight 126 lb 6.4 oz (57.3 kg), SpO2 95 %.  General:  Well developed/well nourished in NAD Skin warm/dry Patient not depressed No peripheral clubbing Back-scoliosis HEENT-normal/normal eyelids Neck supple/normal carotid upstroke bilaterally; no bruits; no JVD; no thyromegaly chest - CTA/ normal expansion CV - RRR/normal S1 and S2; no murmurs, rubs or gallops;  PMI nondisplaced Abdomen -NT/ND, no HSM, no mass, + bowel sounds, no bruit 2+ femoral pulses, no bruits Ext-no edema, chords, 2+ DP Neuro-grossly nonfocal  ECG -May 22, 2017-sinus rhythm with nonspecific ST changes.  Personally reviewed  A/P  1 chest pain-symptoms are concerning for new onset angina.  She has chest pain with moderate activities relieved with rest.  Her exercise treadmill showed no ST changes but she did have chest pain, had poor exercise tolerance and did not achieve target heart rate.  She continues to have symptoms.  I feel definitive evaluation is warranted.  Plan cardiac catheterization.  The risks and benefits including myocardial infarction, CVA and death discussed and she agrees to proceed.  Add aspirin 81 mg daily.  Note patient's father had a myocardial infarction at age 85.  If she has  coronary disease we will add a statin as well.  2 asthma-continue bronchodilators as needed.  3 gastroesophageal reflux disease-management per primary care.    Kirk Ruths, MD

## 2017-06-24 NOTE — Telephone Encounter (Signed)
Received fax from [employer] that Dexilant requires a PA. Information has been sent to the insurance company. Awaiting determination.   

## 2017-06-25 ENCOUNTER — Telehealth: Payer: Self-pay | Admitting: *Deleted

## 2017-06-25 ENCOUNTER — Other Ambulatory Visit: Payer: Self-pay | Admitting: Physician Assistant

## 2017-06-25 ENCOUNTER — Other Ambulatory Visit: Payer: Self-pay

## 2017-06-25 ENCOUNTER — Ambulatory Visit (INDEPENDENT_AMBULATORY_CARE_PROVIDER_SITE_OTHER): Payer: BLUE CROSS/BLUE SHIELD | Admitting: Rehabilitative and Restorative Service Providers"

## 2017-06-25 ENCOUNTER — Observation Stay (HOSPITAL_COMMUNITY)
Admission: EM | Admit: 2017-06-25 | Discharge: 2017-06-27 | Disposition: A | Discharge status: Home / Self Care | Payer: BLUE CROSS/BLUE SHIELD | Attending: Internal Medicine | Admitting: Internal Medicine

## 2017-06-25 ENCOUNTER — Emergency Department (HOSPITAL_COMMUNITY): Payer: BLUE CROSS/BLUE SHIELD

## 2017-06-25 DIAGNOSIS — R0789 Other chest pain: Secondary | ICD-10-CM

## 2017-06-25 DIAGNOSIS — Z888 Allergy status to other drugs, medicaments and biological substances status: Secondary | ICD-10-CM | POA: Diagnosis not present

## 2017-06-25 DIAGNOSIS — R29898 Other symptoms and signs involving the musculoskeletal system: Secondary | ICD-10-CM | POA: Diagnosis not present

## 2017-06-25 DIAGNOSIS — M545 Low back pain, unspecified: Secondary | ICD-10-CM

## 2017-06-25 DIAGNOSIS — R072 Precordial pain: Secondary | ICD-10-CM | POA: Diagnosis not present

## 2017-06-25 DIAGNOSIS — J9383 Other pneumothorax: Secondary | ICD-10-CM | POA: Diagnosis not present

## 2017-06-25 DIAGNOSIS — R0602 Shortness of breath: Secondary | ICD-10-CM

## 2017-06-25 DIAGNOSIS — J939 Pneumothorax, unspecified: Secondary | ICD-10-CM | POA: Diagnosis not present

## 2017-06-25 DIAGNOSIS — F419 Anxiety disorder, unspecified: Secondary | ICD-10-CM

## 2017-06-25 DIAGNOSIS — Z79899 Other long term (current) drug therapy: Secondary | ICD-10-CM | POA: Diagnosis not present

## 2017-06-25 DIAGNOSIS — E079 Disorder of thyroid, unspecified: Secondary | ICD-10-CM | POA: Insufficient documentation

## 2017-06-25 DIAGNOSIS — R0682 Tachypnea, not elsewhere classified: Secondary | ICD-10-CM | POA: Diagnosis not present

## 2017-06-25 DIAGNOSIS — J45909 Unspecified asthma, uncomplicated: Secondary | ICD-10-CM | POA: Diagnosis not present

## 2017-06-25 DIAGNOSIS — M419 Scoliosis, unspecified: Secondary | ICD-10-CM | POA: Diagnosis not present

## 2017-06-25 DIAGNOSIS — J9311 Primary spontaneous pneumothorax: Secondary | ICD-10-CM | POA: Diagnosis not present

## 2017-06-25 DIAGNOSIS — E039 Hypothyroidism, unspecified: Secondary | ICD-10-CM

## 2017-06-25 DIAGNOSIS — Z885 Allergy status to narcotic agent status: Secondary | ICD-10-CM | POA: Insufficient documentation

## 2017-06-25 DIAGNOSIS — R531 Weakness: Secondary | ICD-10-CM

## 2017-06-25 DIAGNOSIS — R079 Chest pain, unspecified: Secondary | ICD-10-CM | POA: Diagnosis not present

## 2017-06-25 DIAGNOSIS — I700 Atherosclerosis of aorta: Secondary | ICD-10-CM | POA: Diagnosis not present

## 2017-06-25 HISTORY — DX: Pneumothorax, unspecified: J93.9

## 2017-06-25 HISTORY — DX: Prediabetes: R73.03

## 2017-06-25 LAB — BASIC METABOLIC PANEL
Anion gap: 10 (ref 5–15)
BUN / CREAT RATIO: 15 (calc) (ref 6–22)
BUN: 18 mg/dL (ref 7–25)
BUN: 17 mg/dL (ref 6–20)
CHLORIDE: 104 mmol/L (ref 101–111)
CO2: 30 mmol/L (ref 20–32)
CO2: 27 mmol/L (ref 22–32)
CREATININE: 0.92 mg/dL (ref 0.44–1.00)
Calcium: 10 mg/dL (ref 8.6–10.4)
Calcium: 9.7 mg/dL (ref 8.9–10.3)
Chloride: 103 mmol/L (ref 98–110)
Creat: 1.24 mg/dL — ABNORMAL HIGH (ref 0.50–0.99)
GFR calc non Af Amer: >60 mL/min (ref >60)
Glucose, Bld: 120 mg/dL — ABNORMAL HIGH (ref 65–99)
Glucose, Bld: 145 mg/dL — ABNORMAL HIGH (ref 65–99)
Potassium: 4.5 mmol/L (ref 3.5–5.3)
Potassium: 3.8 mmol/L (ref 3.5–5.1)
SODIUM: 140 mmol/L (ref 135–146)
Sodium: 141 mmol/L (ref 135–145)

## 2017-06-25 LAB — CBC
HCT: 46.3 % — ABNORMAL HIGH (ref 36.0–46.0)
Hemoglobin: 15 g/dL (ref 12.0–15.0)
MCH: 30.2 pg (ref 26.0–34.0)
MCHC: 32.4 g/dL (ref 30.0–36.0)
MCV: 93.2 fL (ref 78.0–100.0)
PLATELETS: 217 10*3/uL (ref 150–400)
RBC: 4.97 MIL/uL (ref 3.87–5.11)
RDW: 13.7 % (ref 11.5–15.5)
WBC: 7 10*3/uL (ref 4.0–10.5)

## 2017-06-25 LAB — CBC WITH DIFFERENTIAL/PLATELET
BASOS PCT: 0.4 %
Basophils Absolute: 28 cells/uL (ref 0–200)
EOS ABS: 50 {cells}/uL (ref 15–500)
EOS PCT: 0.7 %
HCT: 43 % (ref 35.0–45.0)
HEMOGLOBIN: 14.9 g/dL (ref 11.7–15.5)
LYMPHS ABS: 1945 {cells}/uL (ref 850–3900)
MCH: 30.3 pg (ref 27.0–33.0)
MCHC: 34.7 g/dL (ref 32.0–36.0)
MCV: 87.6 fL (ref 80.0–100.0)
MONOS PCT: 6.4 %
MPV: 11.8 fL (ref 7.5–12.5)
NEUTROS ABS: 4622 {cells}/uL (ref 1500–7800)
Neutrophils Relative %: 65.1 %
Platelets: 242 10*3/uL (ref 140–400)
RBC: 4.91 10*6/uL (ref 3.80–5.10)
RDW: 13.4 % (ref 11.0–15.0)
Total Lymphocyte: 27.4 %
WBC mixed population: 454 cells/uL (ref 200–950)
WBC: 7.1 10*3/uL (ref 3.8–10.8)

## 2017-06-25 LAB — PROTIME-INR
INR: 1
Prothrombin Time: 10.1 s (ref 9.0–11.5)

## 2017-06-25 LAB — TROPONIN I: Troponin I: <0.03 ng/mL (ref <0.03)

## 2017-06-25 LAB — I-STAT TROPONIN, ED: TROPONIN I, POC: 0 ng/mL (ref 0.00–0.08)

## 2017-06-25 LAB — APTT: aPTT: 27 s (ref 22–34)

## 2017-06-25 IMAGING — DX DG CHEST 2V
3 series · 3 of 3 positions shown · non-contrast
Comparison: 09/01/2016

CLINICAL DATA: Chest pain

EXAM:
CHEST - 2 VIEW

[chest pa]
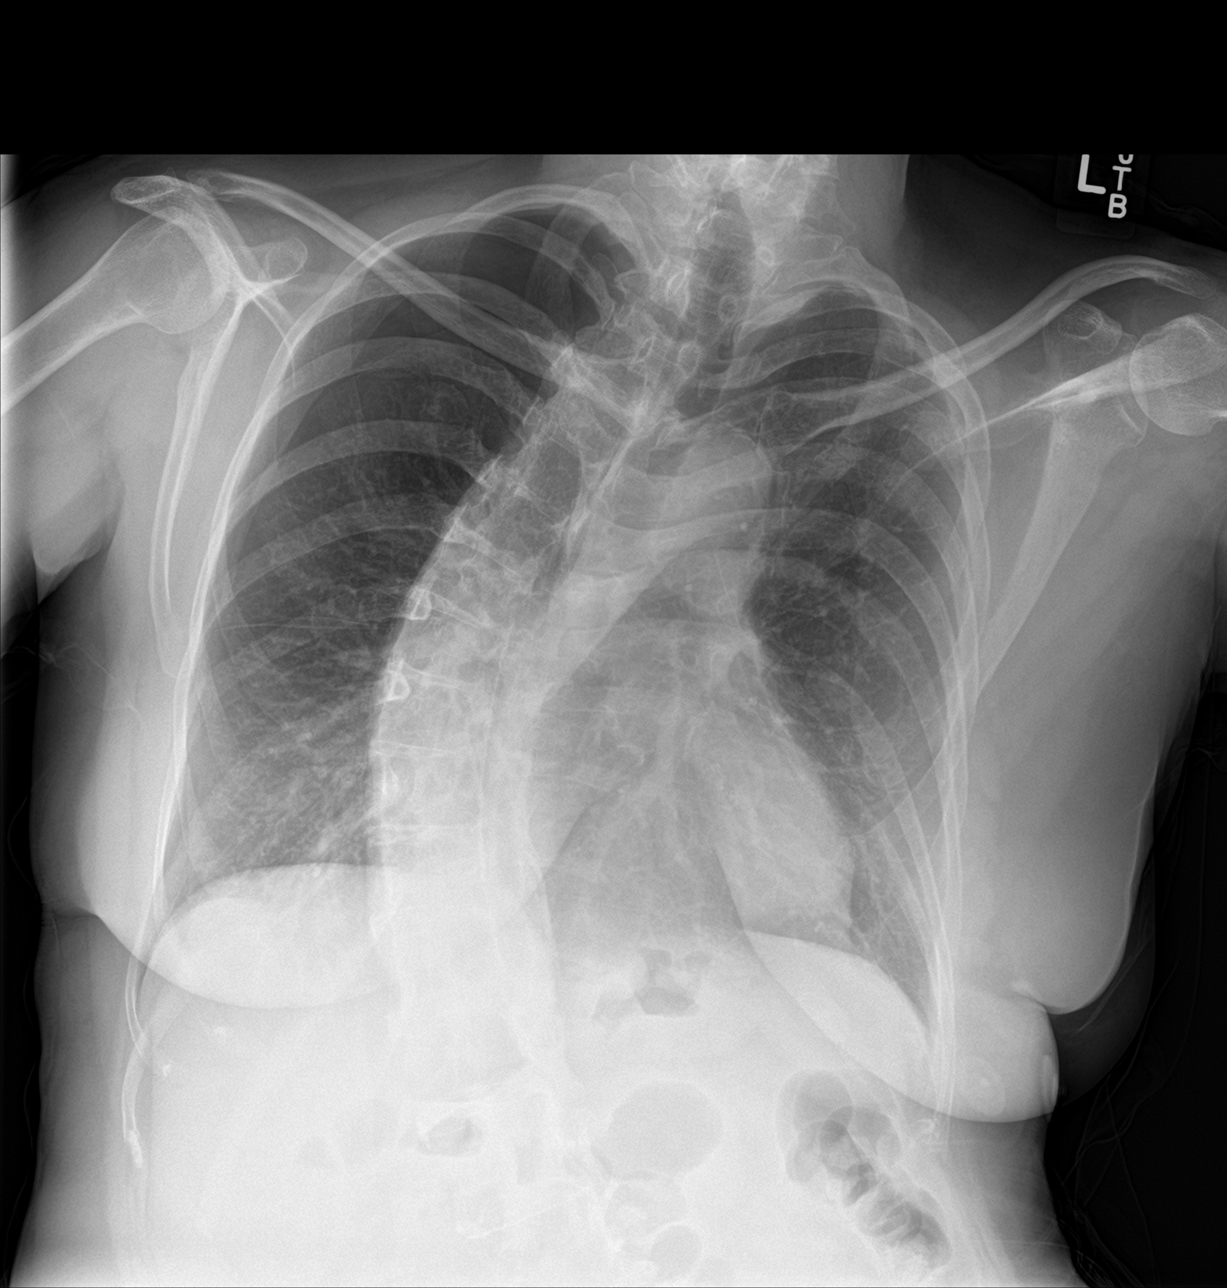

[chest lat (1 of 2)]
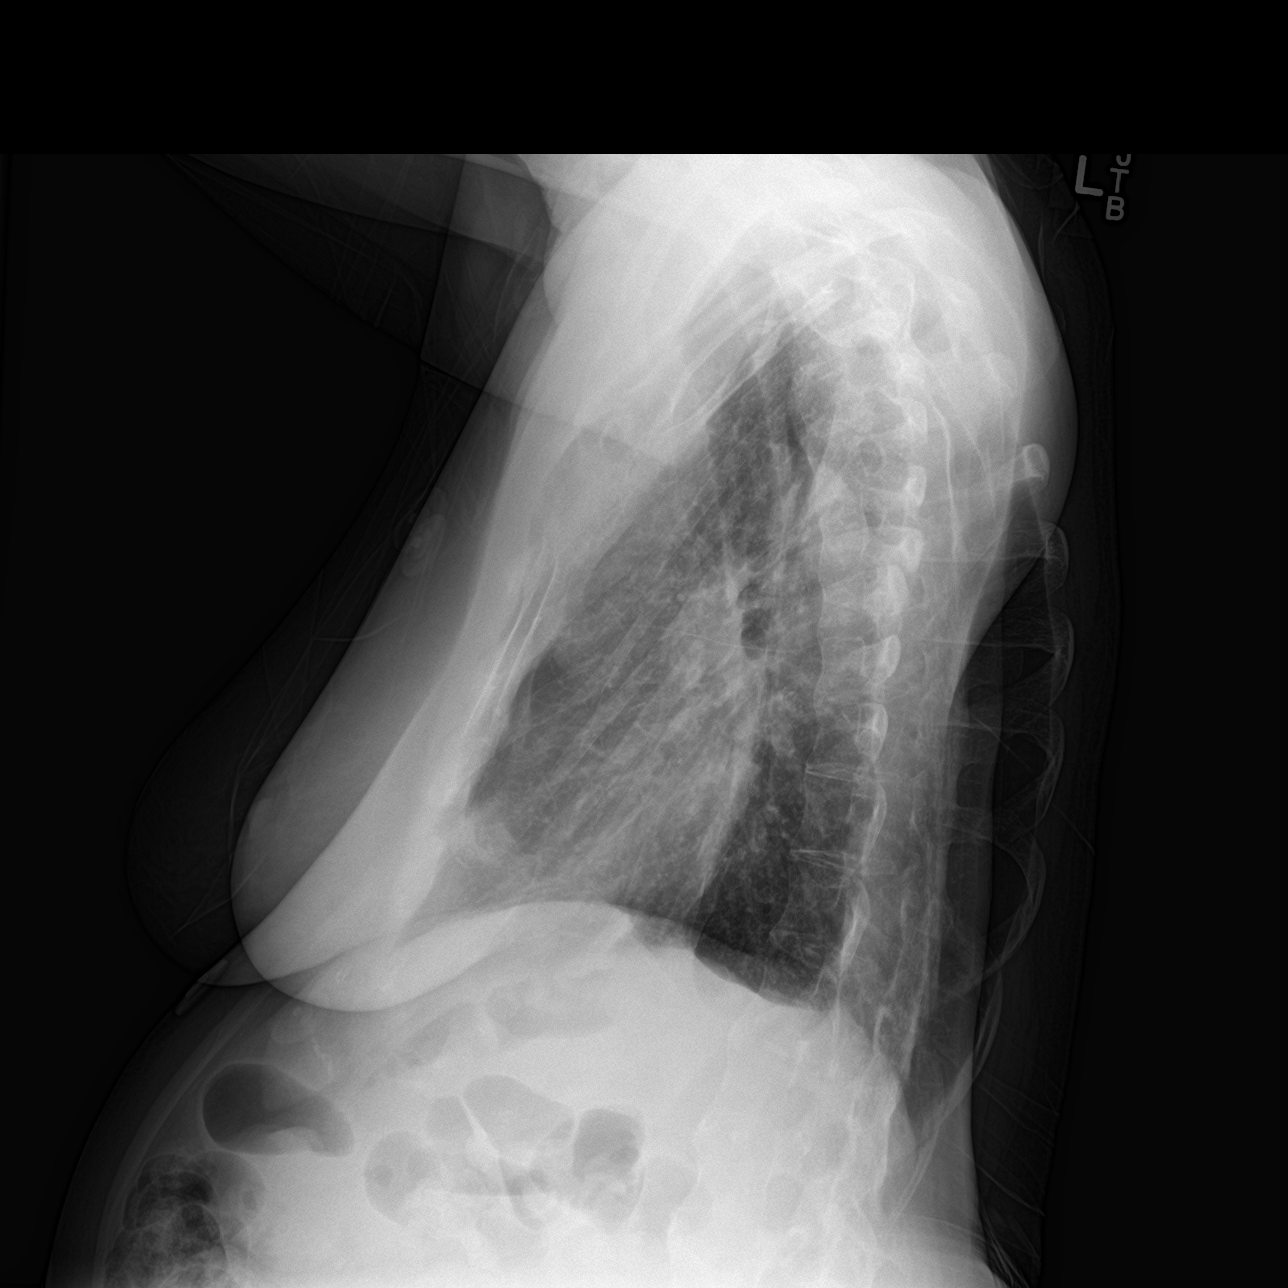

[chest lat (2 of 2)]
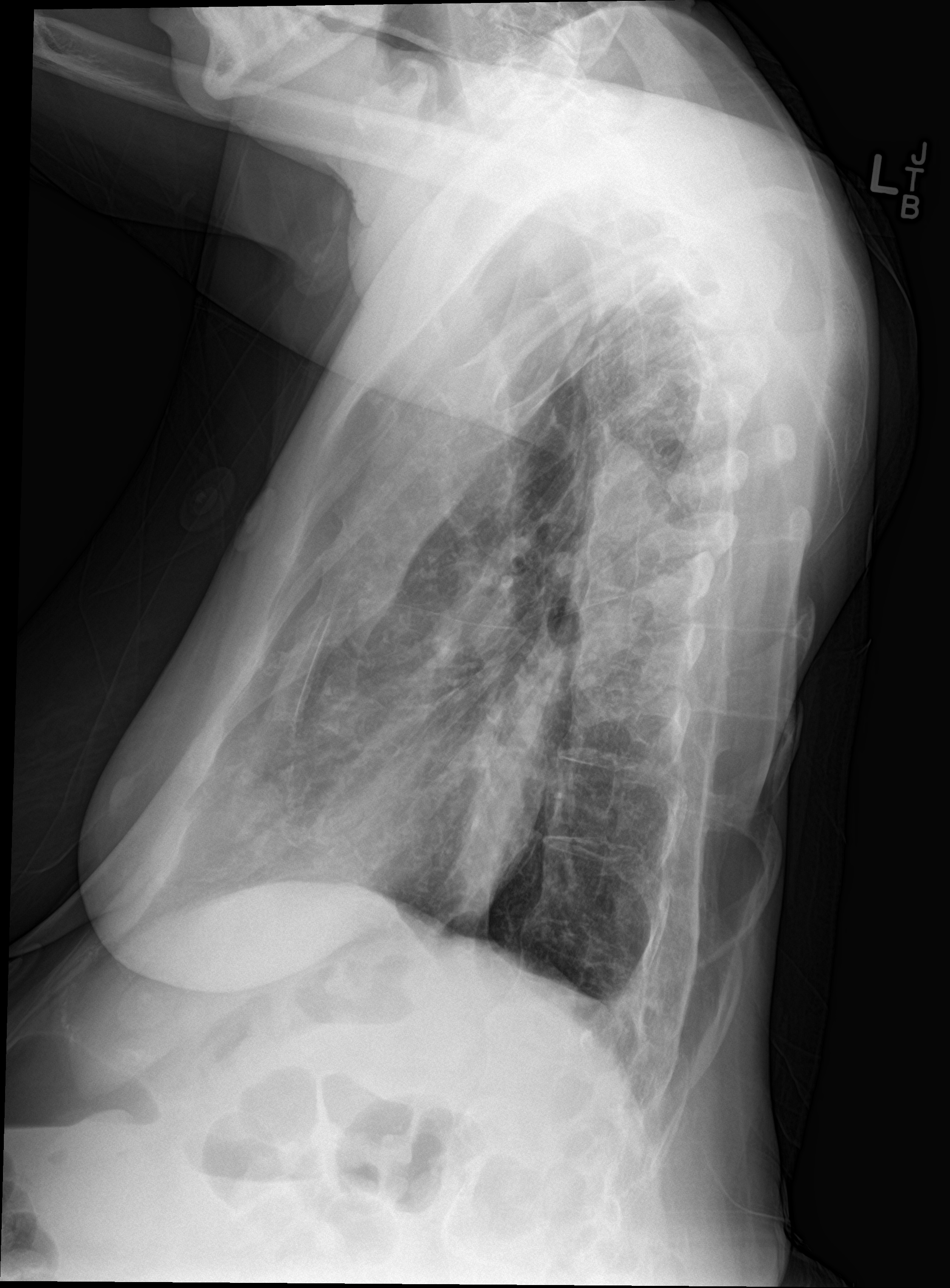

[3 of 3 positions shown; findings below may reference images not displayed]

FINDINGS: Cardiac shadow is at the upper limits of normal in size. Scoliosis
concave to the left is noted stable from the previous exam. Left
lung is well aerated. There is evidence of a spontaneous
pneumothorax on the right with approximately 3 cm excursion from the
apex. No focal infiltrate is seen. No sizable effusion is noted. No
acute bony abnormality is seen.
IMPRESSION: New spontaneous pneumothorax on the right with approximately 3 cm
excursion from the apex.

Critical Value/emergent results were called by telephone at the time
of interpretation on 06/25/2017 at [DATE] to Dr. RINCHI TAIMA ,
who verbally acknowledged these results.

## 2017-06-25 MED ORDER — PANTOPRAZOLE SODIUM 40 MG PO TBEC
40.0000 mg | DELAYED_RELEASE_TABLET | Freq: Every day | ORAL | Status: DC
  Administered 2017-06-26 – 2017-06-27 (×2): 40 mg via ORAL
  Filled 2017-06-25 (×2): qty 1

## 2017-06-25 MED ORDER — SODIUM CHLORIDE 0.9 % WEIGHT BASED INFUSION
1.0000 mL/kg/h | INTRAVENOUS | Status: DC
  Administered 2017-06-26: 1 mL/kg/h via INTRAVENOUS

## 2017-06-25 MED ORDER — ESCITALOPRAM OXALATE 10 MG PO TABS
20.0000 mg | ORAL_TABLET | Freq: Every day | ORAL | Status: DC
  Administered 2017-06-25 – 2017-06-26 (×2): 20 mg via ORAL
  Filled 2017-06-25 (×2): qty 2

## 2017-06-25 MED ORDER — ACETAMINOPHEN 650 MG RE SUPP
650.0000 mg | Freq: Four times a day (QID) | RECTAL | Status: DC | PRN
  Filled 2017-06-25: qty 1

## 2017-06-25 MED ORDER — NITROGLYCERIN 0.4 MG SL SUBL: 0.4000 mg | SUBLINGUAL_TABLET | SUBLINGUAL | Status: DC | PRN

## 2017-06-25 MED ORDER — ONDANSETRON HCL 4 MG/2ML IJ SOLN: 4.0000 mg | Freq: Four times a day (QID) | INTRAMUSCULAR | Status: DC | PRN

## 2017-06-25 MED ORDER — SODIUM CHLORIDE 0.9% FLUSH
3.0000 mL | Freq: Two times a day (BID) | INTRAVENOUS | Status: DC
  Administered 2017-06-25 – 2017-06-27 (×4): 3 mL via INTRAVENOUS

## 2017-06-25 MED ORDER — ACETAMINOPHEN 325 MG PO TABS: 650.0000 mg | ORAL_TABLET | Freq: Four times a day (QID) | ORAL | Status: DC | PRN

## 2017-06-25 MED ORDER — LEVOTHYROXINE SODIUM 125 MCG PO TABS
62.5000 ug | ORAL_TABLET | Freq: Every day | ORAL | Status: DC
  Administered 2017-06-26 – 2017-06-27 (×2): 62.5 ug via ORAL
  Filled 2017-06-25 (×2): qty 0.5

## 2017-06-25 MED ORDER — CLONAZEPAM 0.5 MG PO TABS
0.5000 mg | ORAL_TABLET | Freq: Every day | ORAL | Status: DC
  Administered 2017-06-25 – 2017-06-26 (×2): 0.5 mg via ORAL
  Filled 2017-06-25 (×2): qty 1

## 2017-06-25 MED ORDER — ASPIRIN 81 MG PO CHEW
81.0000 mg | CHEWABLE_TABLET | ORAL | Status: AC
  Administered 2017-06-26: 81 mg via ORAL
  Filled 2017-06-25: qty 1

## 2017-06-25 MED ORDER — ONDANSETRON HCL 4 MG PO TABS: 4.0000 mg | ORAL_TABLET | Freq: Four times a day (QID) | ORAL | Status: DC | PRN

## 2017-06-25 MED ORDER — SODIUM CHLORIDE 0.9% FLUSH: 3.0000 mL | INTRAVENOUS | Status: DC | PRN

## 2017-06-25 MED ORDER — SODIUM CHLORIDE 0.9 % WEIGHT BASED INFUSION: 3.0000 mL/kg/h | INTRAVENOUS | Status: AC

## 2017-06-25 MED ORDER — SODIUM CHLORIDE 0.9 % IV SOLN: 250.0000 mL | INTRAVENOUS | Status: DC | PRN

## 2017-06-25 MED ORDER — SODIUM CHLORIDE 0.9% FLUSH
3.0000 mL | Freq: Two times a day (BID) | INTRAVENOUS | Status: DC
  Administered 2017-06-26 (×2): 3 mL via INTRAVENOUS

## 2017-06-25 MED ORDER — DESVENLAFAXINE SUCCINATE ER 50 MG PO TB24
150.0000 mg | ORAL_TABLET | Freq: Every day | ORAL | Status: DC
  Administered 2017-06-25 – 2017-06-26 (×2): 150 mg via ORAL
  Filled 2017-06-25 (×3): qty 3

## 2017-06-25 MED ORDER — ASPIRIN 325 MG PO TABS
325.0000 mg | ORAL_TABLET | Freq: Every day | ORAL | Status: DC
  Administered 2017-06-26 – 2017-06-27 (×2): 325 mg via ORAL
  Filled 2017-06-25 (×2): qty 1

## 2017-06-25 MED ORDER — BUSPIRONE HCL 10 MG PO TABS
30.0000 mg | ORAL_TABLET | Freq: Two times a day (BID) | ORAL | Status: DC
  Administered 2017-06-25 – 2017-06-27 (×4): 30 mg via ORAL
  Filled 2017-06-25 (×5): qty 3

## 2017-06-25 MED ORDER — SODIUM CHLORIDE 0.9 % WEIGHT BASED INFUSION: 1.0000 mL/kg/h | INTRAVENOUS | Status: DC

## 2017-06-25 MED ORDER — DESVENLAFAXINE SUCCINATE ER 50 MG PO TB24: 50.0000 mg | ORAL_TABLET | Freq: Every day | ORAL | Status: DC

## 2017-06-25 MED ORDER — LIDOCAINE 5 % EX PTCH: 1.0000 | MEDICATED_PATCH | Freq: Every day | CUTANEOUS | Status: DC | PRN

## 2017-06-25 MED ORDER — SODIUM CHLORIDE 0.9 % WEIGHT BASED INFUSION: 3.0000 mL/kg/h | INTRAVENOUS | Status: DC

## 2017-06-25 NOTE — ED Triage Notes (Signed)
Pt states she began having substernal cp after physical therapy  today at 1pm. Pt takes this for back pain.  Pt has worse pain with exertion and deep breaths.

## 2017-06-25 NOTE — H&P (Signed)
[facility] History and Physical  Mariette Cowley Shilling OVF:[id number] DOB: Aug 22, 1955 DOA: 06/25/2017  Referring physician:  PCP: Donella Stade, PA-C   Chief Complaint: "I all of a sudden became short of breath."  HPI: Leslie Roberson is a 62 y.o. female with past medical history significant for asthma, thyroid disease and anxiety presents the emergency room with shortness of breath and chest pain.  Patient states that she was at her physical therapy appointment getting needling for pain.  Therapist informed her that the needle could cause alignment collapse.  During the treatment patient developed chest pain shortness of breath.  Nurses later when assessing the patient for possible cardiac cath 3/29 felt the patient should be seen in the emergency room due to her chest pain.  Patient made her way to the emergency room.  No recent medication changes.  Denies any chest trauma.  ED course: Chest x-ray showed pneumothorax.  CT surgery consulted.  No chest tube at this time.  Cardiology consulted and they will reassess patient at a later time.  Negative troponin.  Hospitalist consulted for admission.   Review of Systems:  As per HPI otherwise 10 point review of systems negative.    Past Medical History:  Diagnosis Date  . Anxiety   . Asthma, chronic 11/27/2015  . Depression   . Diverticulitis of colon 12/21/2015   Colonoscopy every 5 years/digestive health.   . GERD (gastroesophageal reflux disease) 11/27/2015  . Idiopathic scoliosis 01/22/2016  . Osteoporosis 11/27/2015   On prolia.   . Thyroid disease    Past Surgical History:  Procedure Laterality Date  . ABDOMINAL HYSTERECTOMY    . bladder mesh    . ELBOW SURGERY Right   . SPINAL FUSION    . TONSILLECTOMY    . TOTAL HIP ARTHROPLASTY     Social History:  reports that she has never smoked. She has never used smokeless tobacco. She reports that she drinks alcohol. She reports that she does not use drugs.  Allergies    Allergen Reactions  . Hydromorphone Rash  . Levofloxacin Other (See Comments)    Hallucinations  . Meperidine Nausea And Vomiting and Other (See Comments)    Also "doesn't work well for my pain"  . Broccoli [Brassica Oleracea Italica] Nausea Only and Other (See Comments)    Causes stomach pain also  . Adhesive [Tape] Rash    Family History  Problem Relation Age of Onset  . Cancer Mother        lung  . Depression Mother   . Heart attack Father   . Hyperlipidemia Father   . Hypertension Father   . Diabetes Maternal Aunt   . Hyperlipidemia Paternal Aunt   . COPD Paternal Aunt      Prior to Admission medications   Medication Sig Start Date End Date Taking? Authorizing Provider  albuterol (PROVENTIL HFA;VENTOLIN HFA) 108 (90 Base) MCG/ACT inhaler Inhale 1-2 puffs into the lungs every 4 (four) hours as needed for wheezing or shortness of breath. 07/09/16  Yes Trixie Dredge, PA-C  aspirin EC 81 MG tablet Take 81 mg by mouth at bedtime.   Yes [provider]  busPIRone (BUSPAR) 30 MG tablet TAKE 1 TABLET BY MOUTH TWICE A DAY Patient taking differently: Take 30 mg by mouth two times a day 03/11/17  Yes Breeback, Jade L, PA-C  calcium-vitamin D (OSCAL WITH D) 500-200 MG-UNIT tablet Take 1 tablet by mouth daily.   Yes [provider]  clobetasol (TEMOVATE)  0.05 % external solution Apply 1 application topically 2 (two) times daily. Patient taking differently: Apply 1 application topically 2 (two) times daily as needed (for itching).  05/22/17  Yes Breeback, Jade L, PA-C  clonazePAM (KLONOPIN) 0.5 MG tablet Take 1 tablet (0.5 mg total) by mouth daily as needed for anxiety. Patient taking differently: Take 0.5 mg by mouth at bedtime.  06/09/17 09/07/17 Yes Merian Capron, MD  denosumab (PROLIA) 60 MG/ML SOLN injection Inject 60 mg into the skin every 6 (six) months. Administer in upper arm, thigh, or abdomen   Yes [provider]  desvenlafaxine (PRISTIQ)  100 MG 24 hr tablet TAKE 1 TABLET BY MOUTH EVERY DAY Patient taking differently: Take 100 mg by mouth at bedtime 05/15/17  Yes Breeback, Jade L, PA-C  desvenlafaxine (PRISTIQ) 50 MG 24 hr tablet TAKE ONE TABLET BY MOUTH DAILY Patient taking differently: Take 50 mg by mouth at bedtime 12/25/16  Yes Breeback, Jade L, PA-C  dexlansoprazole (DEXILANT) 60 MG capsule TAKE 1 CAPSULE BY MOUTH EVERY DAY Patient taking differently: Take 60 mg by mouth daily.  06/12/17  Yes Breeback, Jade L, PA-C  diclofenac sodium (VOLTAREN) 1 % GEL APPLY 4G TOPICALLY 4 TIMES DAILY Patient taking differently: Apply 4 g topically 4 (four) times daily as needed (to affected painful sites).  12/22/16  Yes Gregor Hams, MD  escitalopram (LEXAPRO) 10 MG tablet Take 20 mg by mouth at bedtime.    Yes [provider]  flunisolide (NASAREL) 29 MCG/ACT (0.025%) nasal spray Place 2 sprays into the nose daily as needed for rhinitis.    Yes [provider]  gabapentin (NEURONTIN) 300 MG capsule TAKE 1 CAP AT BEDTIME X1 WEEK,1 CAP TWICE DAILY X1 WEEK THEN 1 CAP 3X DAILY MAY DOUBLE WEEKLY=3600MG  Patient taking differently: Take 300 mg by mouth in the morning and 600 mg at bedtime 06/08/17  Yes Gregor Hams, MD  hydrOXYzine (ATARAX/VISTARIL) 25 MG tablet Take 1 tablet (25 mg total) by mouth 3 (three) times daily as needed. For chest tightness and/or scalp itching Patient taking differently: Take 25 mg by mouth at bedtime.  06/24/17  Yes Breeback, Jade L, PA-C  levothyroxine (SYNTHROID, LEVOTHROID) 125 MCG tablet TAKE ONE-HALF TABLET BY MOUTH ONCE DAILY Patient taking differently: Take 62.5 mcg by mouth once a day 05/19/17  Yes Breeback, Jade L, PA-C  lidocaine (LIDODERM) 5 % Place 1 patch onto the skin daily. Remove & Discard patch within 12 hours or as directed by MD Patient taking differently: Place 1 patch onto the skin daily as needed (for pain). Remove & Discard patch within 12 hours or as directed by MD 06/11/17  Yes Gregor Hams, MD  Menthol, Topical Analgesic, (MINERAL ICE EX) Apply 1 application to affected sites one to two times a day as needed (for pain)   Yes [provider]  Omega-3 Fatty Acids (FISH OIL PO) Take 1 capsule by mouth daily.    Yes [provider]  pantoprazole (PROTONIX) 40 MG tablet Take 1 tablet (40 mg total) by mouth daily. Patient taking differently: Take 40 mg by mouth at bedtime.  06/23/17  Yes Breeback, Jade L, PA-C  traZODone (DESYREL) 50 MG tablet TAKE 1 TABLET (50 MG TOTAL) BY MOUTH AT BEDTIME. 12/24/16  Yes Breeback, Jade L, PA-C  HYDROcodone-acetaminophen (NORCO/VICODIN) 5-325 MG tablet Take 1 tablet by mouth every 8 (eight) hours as needed for moderate pain. After toenail removal. Patient not taking: Reported on 06/25/2017 03/17/17   Alden Hipp,  Jade L, PA-C  ipratropium (ATROVENT) 0.06 % nasal spray Place 2 sprays into both nostrils 4 (four) times daily. Patient not taking: Reported on 06/25/2017 10/27/16   Emeterio Reeve, DO  meloxicam (MOBIC) 15 MG tablet Take 15 mg by mouth daily.     [provider]  naproxen sodium (ANAPROX) 220 MG tablet Take 220 mg by mouth as needed (pain).     [provider]  nortriptyline (PAMELOR) 25 MG capsule TAKE 1 CAPSULE (25 MG TOTAL) BY MOUTH AT BEDTIME Patient not taking: Reported on 06/25/2017 05/25/17   Iran Planas L, PA-C  triamcinolone ointment (KENALOG) 0.1 % Apply 1 application topically 2 (two) times daily. To affected area(s) as needed. Use no longer than 1-2 weeks. Patient not taking: Reported on 06/25/2017 06/04/16   Emeterio Reeve, DO  ARIPiprazole (ABILIFY) 2 MG tablet Take 0.5 tablets (1 mg total) by mouth daily. 10/23/16 11/27/16  Gregor Hams, MD   Physical Exam: Vitals:   06/25/17 1525 06/25/17 1645 06/25/17 1734  BP: 118/77 (!) 133/94 135/83  Pulse: 89 81 79  Resp: 16 20 18   Temp: 98.3 F (36.8 C)    TempSrc: Oral    SpO2: 99% 99% 100%    Wt Readings from Last 3 Encounters:  06/24/17  57.3 kg (126 lb 6.4 oz)  06/23/17 56.7 kg (125 lb)  06/11/17 57.2 kg (126 lb)    General:  Appears calm and comfortable; a&Ox3 Eyes:  PERRL, EOMI, normal lids, iris ENT:  grossly normal hearing, lips & tongue Neck:  no LAD, masses or thyromegaly Cardiovascular:  RRR, no m/r/g. No LE edema.  Respiratory:  CTA bilaterally but no airmovement RUL, no w/r/r. Normal respiratory effort. Abdomen:  soft, ntnd Skin:  no rash or induration seen on limited exam Musculoskeletal:  grossly normal tone BUE/BLE Psychiatric:  grossly normal mood and affect, speech fluent and appropriate Neurologic:  CN 2-12 grossly intact, moves all extremities in coordinated fashion.          Labs on Admission:  Basic Metabolic Panel: Recent Labs  Lab 06/24/17 1540 06/25/17 1544  NA 140 141  K 4.5 3.8  CL 103 104  CO2 30 27  GLUCOSE 120* 145*  BUN 18 17  CREATININE 1.24* 0.92  CALCIUM 10.0 9.7   Liver Function Tests: No results for input(s): AST, ALT, ALKPHOS, BILITOT, PROT, ALBUMIN in the last 168 hours. No results for input(s): LIPASE, AMYLASE in the last 168 hours. No results for input(s): AMMONIA in the last 168 hours. CBC: Recent Labs  Lab 06/24/17 1540 06/25/17 1544  WBC 7.1 7.0  NEUTROABS 4,622  --   HGB 14.9 15.0  HCT 43.0 46.3*  MCV 87.6 93.2  PLT 242 217   Cardiac Enzymes: No results for input(s): CKTOTAL, CKMB, CKMBINDEX, TROPONINI in the last 168 hours.  BNP (last 3 results) No results for input(s): BNP in the last 8760 hours.  ProBNP (last 3 results) No results for input(s): PROBNP in the last 8760 hours.   Serum creatinine: 0.92 mg/dL 06/25/17 1544 Estimated creatinine clearance: 48.2 mL/min  CBG: No results for input(s): GLUCAP in the last 168 hours.  Radiological Exams on Admission: Dg Chest 2 View  Result Date: 06/25/2017 CLINICAL DATA:  Chest pain EXAM: CHEST - 2 VIEW COMPARISON:  09/01/2016 FINDINGS: Cardiac shadow is at the upper limits of normal in size.  Scoliosis concave to the left is noted stable from the previous exam. Left lung is well aerated. There is evidence of a  spontaneous pneumothorax on the right with approximately 3 cm excursion from the apex. No focal infiltrate is seen. No sizable effusion is noted. No acute bony abnormality is seen. IMPRESSION: New spontaneous pneumothorax on the right with approximately 3 cm excursion from the apex. Critical Value/emergent results were called by telephone at the time of interpretation on 06/25/2017 at 4:05 pm to Dr. Zenovia Jarred , who verbally acknowledged these results. Electronically Signed   By: Inez Catalina M.D.   On: 06/25/2017 16:09    EKG: Independently reviewed. NSR, no stemi.  Assessment/Plan Active Problems:   Pneumothorax   Pneumothorax CT surgery following CXR in AM  CP Cardio following Ntg sl prn QD ASA Prn EKG  Anxiety Buspar BID No SI/HI Klonopin qhs Cont prestiq, lexapro Hold trazadone  Hypothyroidism Cont OP synthroid 62.5 mcg qd No signs of hyper or hypothyroidism   GERD Cont PPI  Chronic pain Hold gabapentin hodl mobic Cont lidoderm patch  Code Status: FC  DVT Prophylaxis: SCDs Family Communication: husband at bedside Disposition Plan: Pending Improvement  Status: tele, inpt  Elwin Mocha, MD Family Medicine [facility] [email or web address] Password [email or web address]

## 2017-06-25 NOTE — Therapy (Signed)
[facility] [place], [place], [address] Phone: [phone]   Fax:  [phone]  Physical Therapy Treatment  Patient Details  Name: Leslie Roberson MRN: [id number] Date of Birth: 1956-03-30 Referring Provider: Dr Lynne Leader    Encounter Date: 06/25/2017  PT End of Session - 06/25/17 1106    Visit Number  3    Number of Visits  12    Date for PT Re-Evaluation  07/29/17    PT Start Time  1102    PT Stop Time  1154    PT Time Calculation (min)  52 min    Activity Tolerance  Patient tolerated treatment well       Past Medical History:  Diagnosis Date  . Anxiety   . Asthma, chronic 11/27/2015  . Depression   . Diverticulitis of colon 12/21/2015   Colonoscopy every 5 years/digestive health.   . GERD (gastroesophageal reflux disease) 11/27/2015  . Idiopathic scoliosis 01/22/2016  . Osteoporosis 11/27/2015   On prolia.   . Thyroid disease     Past Surgical History:  Procedure Laterality Date  . ABDOMINAL HYSTERECTOMY    . bladder mesh    . ELBOW SURGERY Right   . SPINAL FUSION    . TONSILLECTOMY    . TOTAL HIP ARTHROPLASTY      There were no vitals filed for this visit.  Subjective Assessment - 06/25/17 1120    Subjective  LB was still tight and hurting yesterday but feels better today. She added the sittig stretch and that feels good. She is having some tightness up along the shoulder blade. Pt is scheduled for cardiac cath tomorrow.     Pain Score  5     Pain Location  Back    Pain Orientation  Right;Lower    Pain Descriptors / Indicators  Aching;Tightness;Tender    Pain Type  Acute pain;Chronic pain                No data recorded       [facility] Adult PT Treatment/Exercise - 06/25/17 0001      Lumbar Exercises: Stretches   Double Knee to Chest Stretch  3 reps;30 seconds    Piriformis Stretch  Right;3 reps;30 seconds supine travell    Other Lumbar Stretch Exercise  sitting with  lateral trunk flexion over bolster for 3 reps of 30 sec hold       Moist Heat Therapy   Number Minutes Moist Heat  20 Minutes    Moist Heat Location  Lumbar Spine      Electrical Stimulation   Electrical Stimulation Location  Rt lumbar/SI area     Electrical Stimulation Action  IFC    Electrical Stimulation Parameters  to tolerance    Electrical Stimulation Goals  Pain;Tone      Manual Therapy   Manual therapy comments  pt prone     Soft tissue mobilization  soft tissue work Rt lumbar/QL/SI area     Myofascial Release  Rt lumbar/sacral area        Trigger Point Dry Needling - 06/25/17 1155    Consent Given?  Yes    Muscles Treated Lower Body  -- Rt with estim     Longissimus Response  Palpable increased muscle length                PT Long Term Goals - 06/23/17 1150      PT LONG TERM GOAL #1   Title  independent with HEP (07/29/17)    Time  6    Period  Weeks    Status  On-going      PT LONG TERM GOAL #2   Title  improve lumbar spine mobility by 10-20% throughout  (07/29/17)    Time  6    Period  Weeks    Status  On-going      PT LONG TERM GOAL #3   Title  report ability to walk > 15 min without increase in pain for improved activity tolerance and decreased pain (07/29/17)    Time  6    Period  Weeks    Status  On-going      PT LONG TERM GOAL #4   Title  improve FOTO to </= 42% limitation (07/29/17)    Time  6    Period  Weeks    Status  On-going      PT LONG TERM GOAL #5   Title  decrease pain in Rt hip by 50-75% allowing patient to return to normal functional activitiy level (07/29/17)    Time  6    Period  Weeks    Status  On-going            Plan - 06/25/17 1156    Clinical Impression Statement  Improved LBP with decreased pain score on the 1-10 scale. Patient is having some pain and tightness through the Rt thoracic paraspinals. She responded well with decreased muscular tightness to palpation.     Rehab Potential  Good    PT Frequency  2x /  week    PT Duration  6 weeks    PT Treatment/Interventions  Patient/family education;ADLs/Self Care Home Management;Cryotherapy;Electrical Stimulation;Iontophoresis 4mg /ml Dexamethasone;Ultrasound;Dry needling;Manual techniques;Neuromuscular re-education;Therapeutic activities;Therapeutic exercise    PT Next Visit Plan  continue DN for lumbar spine; manual work; ther exercise; modalities as indicated     Consulted and Agree with Plan of Care  Patient       Patient will benefit from skilled therapeutic intervention in order to improve the following deficits and impairments:  Postural dysfunction, Improper body mechanics, Pain, Increased fascial restricitons, Increased muscle spasms, Hypomobility, Decreased mobility, Decreased range of motion, Decreased activity tolerance  Visit Diagnosis: Acute right-sided low back pain without sciatica  Other symptoms and signs involving the musculoskeletal system  Weakness generalized     Problem List Patient Active Problem List   Diagnosis Date Noted  . Atypical chest pain 05/24/2017  . Itchy scalp 05/24/2017  . Right leg pain 02/06/2017  . Chronic tension-type headache, intractable 02/05/2017  . Prediabetes 02/05/2017  . Lump in throat 01/27/2017  . Dysphagia 01/27/2017  . No energy 09/15/2016  . Bad taste in mouth 07/26/2016  . Paresthesia 04/03/2016  . Facet hypertrophy of lumbosacral region 04/03/2016  . Chronic back pain 04/03/2016  . Right knee pain 02/19/2016  . Rosacea 02/04/2016  . Vaginal atrophy 01/31/2016  . Idiopathic scoliosis 01/22/2016  . Hip bursitis 12/25/2015  . Biceps tendonitis on right 12/25/2015  . Diverticulitis of colon 12/21/2015  . GERD (gastroesophageal reflux disease) 11/27/2015  . Osteoporosis 11/27/2015  . GAD (generalized anxiety disorder) 11/27/2015  . MDD (major depressive disorder), recurrent, in full remission ([place]) 11/27/2015  . Chronic dryness of both eyes 11/27/2015  . Hypothyroidism 11/27/2015   . Asthma, chronic 11/27/2015  . Insomnia 11/27/2015  . Seborrheic keratoses 11/27/2015    Celyn Nilda Simmer PT, MPH  06/25/2017, 11:59 AM  [facility] [address]  [place], [place], [address] Phone: [phone]   Fax:  [phone]  Name: Leslie Roberson MRN: [id number] Date of Birth: 1955-04-04

## 2017-06-25 NOTE — Telephone Encounter (Signed)
Catheterization scheduled at [facility] for: Friday June 26, 2017 12 noon Arrival time and place: [place] Entrance A/[place] at: 10 AM Nothing to eat or drink after midnight prior to cath.   AM meds can be  taken pre-cath with sip of water including: ASA 81 mg   Increase PO fluids pre-cath.  Patient has responsible person to drive home post procedure and observe patient for 24 hours  LMTCB to discuss with patient.

## 2017-06-25 NOTE — ED Provider Notes (Signed)
[facility] EMERGENCY DEPARTMENT Provider Note   CSN: [id number] Arrival date & time: 06/25/17  1517     History   Chief Complaint Chief Complaint  Patient presents with  . Chest Pain    HPI Leslie Roberson is a 62 y.o. female history of anxiety, reflux, osteoporosis here presenting with shortness of breath, chest pain.  She has been having chest pain for the last week that is worse with exertion.  Patient saw cardiology yesterday and is scheduled for cardiac catheterization tomorrow.  She also has very bad scoliosis and has some scapula pain so was at physical therapy today.  She states that physical therapy, she was moving around a lot and did have acupuncture to the right scapular area.  Shortly after physical therapy, she noted worsening shortness of breath even with rest.  She also complained of constant chest pain as well and came to the ED for evaluation.  He was noted to have a pneumothorax in triage on the CXR.   The history is provided by the patient.    Past Medical History:  Diagnosis Date  . Anxiety   . Asthma, chronic 11/27/2015  . Depression   . Diverticulitis of colon 12/21/2015   Colonoscopy every 5 years/digestive health.   . GERD (gastroesophageal reflux disease) 11/27/2015  . Idiopathic scoliosis 01/22/2016  . Osteoporosis 11/27/2015   On prolia.   . Thyroid disease     Patient Active Problem List   Diagnosis Date Noted  . Atypical chest pain 05/24/2017  . Itchy scalp 05/24/2017  . Right leg pain 02/06/2017  . Chronic tension-type headache, intractable 02/05/2017  . Prediabetes 02/05/2017  . Lump in throat 01/27/2017  . Dysphagia 01/27/2017  . No energy 09/15/2016  . Bad taste in mouth 07/26/2016  . Paresthesia 04/03/2016  . Facet hypertrophy of lumbosacral region 04/03/2016  . Chronic back pain 04/03/2016  . Right knee pain 02/19/2016  . Rosacea 02/04/2016  . Vaginal atrophy 01/31/2016  . Idiopathic scoliosis 01/22/2016  .  Hip bursitis 12/25/2015  . Biceps tendonitis on right 12/25/2015  . Diverticulitis of colon 12/21/2015  . GERD (gastroesophageal reflux disease) 11/27/2015  . Osteoporosis 11/27/2015  . GAD (generalized anxiety disorder) 11/27/2015  . MDD (major depressive disorder), recurrent, in full remission ([place]) 11/27/2015  . Chronic dryness of both eyes 11/27/2015  . Hypothyroidism 11/27/2015  . Asthma, chronic 11/27/2015  . Insomnia 11/27/2015  . Seborrheic keratoses 11/27/2015    Past Surgical History:  Procedure Laterality Date  . ABDOMINAL HYSTERECTOMY    . bladder mesh    . ELBOW SURGERY Right   . SPINAL FUSION    . TONSILLECTOMY    . TOTAL HIP ARTHROPLASTY       OB History   None      Home Medications    Prior to Admission medications   Medication Sig Start Date End Date Taking? Authorizing Provider  albuterol (PROVENTIL HFA;VENTOLIN HFA) 108 (90 Base) MCG/ACT inhaler Inhale 1-2 puffs into the lungs every 4 (four) hours as needed for wheezing or shortness of breath. 07/09/16  Yes Trixie Dredge, PA-C  aspirin EC 81 MG tablet Take 81 mg by mouth at bedtime.   Yes [provider]  busPIRone (BUSPAR) 30 MG tablet TAKE 1 TABLET BY MOUTH TWICE A DAY Patient taking differently: Take 30 mg by mouth two times a day 03/11/17  Yes Breeback, Jade L, PA-C  calcium-vitamin D (OSCAL WITH D) 500-200 MG-UNIT tablet Take 1 tablet by  mouth daily.   Yes [provider]  clobetasol (TEMOVATE) 0.05 % external solution Apply 1 application topically 2 (two) times daily. Patient taking differently: Apply 1 application topically 2 (two) times daily as needed (for itching).  05/22/17  Yes Breeback, Jade L, PA-C  clonazePAM (KLONOPIN) 0.5 MG tablet Take 1 tablet (0.5 mg total) by mouth daily as needed for anxiety. Patient taking differently: Take 0.5 mg by mouth at bedtime.  06/09/17 09/07/17 Yes Merian Capron, MD  denosumab (PROLIA) 60 MG/ML SOLN injection Inject 60 mg into  the skin every 6 (six) months. Administer in upper arm, thigh, or abdomen   Yes [provider]  desvenlafaxine (PRISTIQ) 100 MG 24 hr tablet TAKE 1 TABLET BY MOUTH EVERY DAY Patient taking differently: Take 100 mg by mouth at bedtime 05/15/17  Yes Breeback, Jade L, PA-C  desvenlafaxine (PRISTIQ) 50 MG 24 hr tablet TAKE ONE TABLET BY MOUTH DAILY Patient taking differently: Take 50 mg by mouth at bedtime 12/25/16  Yes Breeback, Jade L, PA-C  dexlansoprazole (DEXILANT) 60 MG capsule TAKE 1 CAPSULE BY MOUTH EVERY DAY Patient taking differently: Take 60 mg by mouth daily.  06/12/17  Yes Breeback, Jade L, PA-C  diclofenac sodium (VOLTAREN) 1 % GEL APPLY 4G TOPICALLY 4 TIMES DAILY Patient taking differently: Apply 4 g topically 4 (four) times daily as needed (to affected painful sites).  12/22/16  Yes Gregor Hams, MD  escitalopram (LEXAPRO) 10 MG tablet Take 20 mg by mouth at bedtime.    Yes [provider]  flunisolide (NASAREL) 29 MCG/ACT (0.025%) nasal spray Place 2 sprays into the nose daily as needed for rhinitis.    Yes [provider]  gabapentin (NEURONTIN) 300 MG capsule TAKE 1 CAP AT BEDTIME X1 WEEK,1 CAP TWICE DAILY X1 WEEK THEN 1 CAP 3X DAILY MAY DOUBLE WEEKLY=3600MG  Patient taking differently: Take 300 mg by mouth in the morning and 600 mg at bedtime 06/08/17  Yes Gregor Hams, MD  hydrOXYzine (ATARAX/VISTARIL) 25 MG tablet Take 1 tablet (25 mg total) by mouth 3 (three) times daily as needed. For chest tightness and/or scalp itching Patient taking differently: Take 25 mg by mouth at bedtime.  06/24/17  Yes Breeback, Jade L, PA-C  levothyroxine (SYNTHROID, LEVOTHROID) 125 MCG tablet TAKE ONE-HALF TABLET BY MOUTH ONCE DAILY Patient taking differently: Take 62.5 mcg by mouth once a day 05/19/17  Yes Breeback, Jade L, PA-C  lidocaine (LIDODERM) 5 % Place 1 patch onto the skin daily. Remove & Discard patch within 12 hours or as directed by MD Patient taking differently:  Place 1 patch onto the skin daily as needed (for pain). Remove & Discard patch within 12 hours or as directed by MD 06/11/17  Yes Gregor Hams, MD  Menthol, Topical Analgesic, (MINERAL ICE EX) Apply 1 application to affected sites one to two times a day as needed (for pain)   Yes [provider]  Omega-3 Fatty Acids (FISH OIL PO) Take 1 capsule by mouth daily.    Yes [provider]  pantoprazole (PROTONIX) 40 MG tablet Take 1 tablet (40 mg total) by mouth daily. Patient taking differently: Take 40 mg by mouth at bedtime.  06/23/17  Yes Breeback, Jade L, PA-C  traZODone (DESYREL) 50 MG tablet TAKE 1 TABLET (50 MG TOTAL) BY MOUTH AT BEDTIME. 12/24/16  Yes Breeback, Jade L, PA-C  HYDROcodone-acetaminophen (NORCO/VICODIN) 5-325 MG tablet Take 1 tablet by mouth every 8 (eight) hours as needed for moderate pain. After toenail  removal. Patient not taking: Reported on 06/25/2017 03/17/17   Iran Planas L, PA-C  ipratropium (ATROVENT) 0.06 % nasal spray Place 2 sprays into both nostrils 4 (four) times daily. Patient not taking: Reported on 06/25/2017 10/27/16   Emeterio Reeve, DO  meloxicam (MOBIC) 15 MG tablet Take 15 mg by mouth daily.     [provider]  naproxen sodium (ANAPROX) 220 MG tablet Take 220 mg by mouth as needed (pain).     [provider]  nortriptyline (PAMELOR) 25 MG capsule TAKE 1 CAPSULE (25 MG TOTAL) BY MOUTH AT BEDTIME Patient not taking: Reported on 06/25/2017 05/25/17   Iran Planas L, PA-C  triamcinolone ointment (KENALOG) 0.1 % Apply 1 application topically 2 (two) times daily. To affected area(s) as needed. Use no longer than 1-2 weeks. Patient not taking: Reported on 06/25/2017 06/04/16   Emeterio Reeve, DO  ARIPiprazole (ABILIFY) 2 MG tablet Take 0.5 tablets (1 mg total) by mouth daily. 10/23/16 11/27/16  Gregor Hams, MD    Family History Family History  Problem Relation Age of Onset  . Cancer Mother        lung  . Depression Mother    . Heart attack Father   . Hyperlipidemia Father   . Hypertension Father   . Diabetes Maternal Aunt   . Hyperlipidemia Paternal Aunt   . COPD Paternal Aunt     Social History Social History   Tobacco Use  . Smoking status: Never Smoker  . Smokeless tobacco: Never Used  Substance Use Topics  . Alcohol use: Yes    Comment: Occasional  . Drug use: No     Allergies   Hydromorphone; Levofloxacin; Meperidine; Broccoli [brassica oleracea italica]; and Adhesive [tape]   Review of Systems Review of Systems  Respiratory: Positive for shortness of breath.   Cardiovascular: Positive for chest pain.  All other systems reviewed and are negative.    Physical Exam Updated Vital Signs BP 118/77 (BP Location: Right Arm)   Pulse 89   Temp 98.3 F (36.8 C) (Oral)   Resp 16   SpO2 99%   Physical Exam  Constitutional: She appears well-developed.  Chronically ill, slightly tachypneic   HENT:  Head: Normocephalic.  Eyes: Pupils are equal, round, and reactive to light. EOM are normal.  Neck: Normal range of motion.  Cardiovascular: Regular rhythm and normal pulses.  Pulmonary/Chest:  Slightly tachypneic, no breath sounds R upper lobe, + breath sounds L lung. Obvious scoliosis   Musculoskeletal: Normal range of motion.       Right lower leg: Normal.       Left lower leg: Normal.  Neurological: She is alert.  Skin: Skin is warm. Capillary refill takes less than 2 seconds.  Psychiatric: She has a normal mood and affect. Her behavior is normal.  Nursing note and vitals reviewed.    ED Treatments / Results  Labs (all labs ordered are listed, but only abnormal results are displayed) Labs Reviewed  BASIC METABOLIC PANEL - Abnormal; Notable for the following components:      Result Value   Glucose, Bld 145 (*)    All other components within normal limits  CBC - Abnormal; Notable for the following components:   HCT 46.3 (*)    All other components within normal limits  I-STAT  TROPONIN, ED    EKG None  Radiology Dg Chest 2 View  Result Date: 06/25/2017 CLINICAL DATA:  Chest pain EXAM: CHEST - 2 VIEW COMPARISON:  09/01/2016 FINDINGS: Cardiac  shadow is at the upper limits of normal in size. Scoliosis concave to the left is noted stable from the previous exam. Left lung is well aerated. There is evidence of a spontaneous pneumothorax on the right with approximately 3 cm excursion from the apex. No focal infiltrate is seen. No sizable effusion is noted. No acute bony abnormality is seen. IMPRESSION: New spontaneous pneumothorax on the right with approximately 3 cm excursion from the apex. Critical Value/emergent results were called by telephone at the time of interpretation on 06/25/2017 at 4:05 pm to Dr. Zenovia Jarred , who verbally acknowledged these results. Electronically Signed   By: Inez Catalina M.D.   On: 06/25/2017 16:09    Procedures Procedures (including critical care time)  CRITICAL CARE Performed by: Wandra Arthurs   Total critical care time: 25minutes  Critical care time was exclusive of separately billable procedures and treating other patients.  Critical care was necessary to treat or prevent imminent or life-threatening deterioration.  Critical care was time spent personally by me on the following activities: development of treatment plan with patient and/or surrogate as well as nursing, discussions with consultants, evaluation of patient's response to treatment, examination of patient, obtaining history from patient or surrogate, ordering and performing treatments and interventions, ordering and review of laboratory studies, ordering and review of radiographic studies, pulse oximetry and re-evaluation of patient's condition.   Medications Ordered in ED Medications - No data to display   Initial Impression / Assessment and Plan / ED Course  I have reviewed the triage vital signs and the nursing notes.  Pertinent labs & imaging results that  were available during my care of the patient were reviewed by me and considered in my medical decision making (see chart for details).     Leslie Roberson is a 62 y.o. female here with chest pain. Chest pain for several weeks with worse exertion. But sudden worsening chest pain after physical therapy. She has no breath sounds on R lung and has about 10% pneumothorax on CXR. No EKG changes and trop neg, I doubt ACS. I think she has chronic chest pain which can be angina from cardiac causes but the acute pain is likely from pneumothorax. Will consult cardiology and CT surgery.   4:57 PM Talked to [place] from cardiology, who will follow patient. I called Dr. Servando Snare from [place] surgery. He recommend repeat CXR in AM and if the pneumothorax worsen, then he will see patient and place chest tube. Put patient on 2 L [place] and her O2 is 100% on RA. Hospitalist to admit.   Final Clinical Impressions(s) / ED Diagnoses   Final diagnoses:  None    ED Discharge Orders    None       Drenda Freeze, MD 06/25/17 [phone]

## 2017-06-25 NOTE — Telephone Encounter (Signed)
Patient wanted to let Luvenia Starch know that she will be having her heart cath tomorrow. Thanks.

## 2017-06-25 NOTE — Telephone Encounter (Signed)
Pt returned my call to discuss cath instructions. Pt states that she is currently having 4-10 chest pain/under her heart towards right side, not associated with any other symptoms, including shortness of breath. Pt states this chest pain is similar to chest pain symptoms she discussed with Dr Stanford Breed yesterday. Pt states she had been to physical therapy earlier today, returned home, was sitting in chair and started having chest pain, this is the first time she had chest pain at rest.  Pt advised to call 911 for transport to [facility] ED for further evaluation, pt verbalized understanding, agreed with plan.

## 2017-06-25 NOTE — ED Provider Notes (Signed)
Patient placed in Quick Look pathway, seen and evaluated   Chief Complaint: chest pain  HPI:   Leslie Roberson is a 62 y.o. female who presents to the ED with substernal chest pain. The pain started while patient was finishing PT about 1 pm. Patient was in PT for her back pain. Patient schedule for cardiac cath tomorrow.   ROS: Heart: chest pain  Physical Exam:  BP 118/77 (BP Location: Right Arm)   Pulse 89   Temp 98.3 F (36.8 C) (Oral)   Resp 16   SpO2 99%    Gen: No distress  Neuro: Awake and Alert  Skin: Warm and dry  Neck: no JVD    Focused Exam:    Initiation of care has begun. The patient has been counseled on the process, plan, and necessity for staying for the completion/evaluation, and the remainder of the medical screening examination    Ashley Murrain, NP 06/25/17 1534    Macarthur Critchley, MD 06/26/17 1929

## 2017-06-25 NOTE — ED Notes (Signed)
Pt just informed me she is already scheduled for heart cath in the am due to having exertion cp intermittently for the last month.

## 2017-06-26 ENCOUNTER — Ambulatory Visit (HOSPITAL_COMMUNITY)
Admission: RE | Admit: 2017-06-26 | Payer: BLUE CROSS/BLUE SHIELD | Source: Ambulatory Visit | Admitting: Internal Medicine

## 2017-06-26 ENCOUNTER — Other Ambulatory Visit: Payer: Self-pay

## 2017-06-26 ENCOUNTER — Encounter (HOSPITAL_COMMUNITY): Admission: RE | Payer: Self-pay | Source: Ambulatory Visit

## 2017-06-26 ENCOUNTER — Encounter (HOSPITAL_COMMUNITY): Payer: Self-pay | Admitting: General Practice

## 2017-06-26 ENCOUNTER — Telehealth: Payer: Self-pay | Admitting: Cardiology

## 2017-06-26 ENCOUNTER — Inpatient Hospital Stay (HOSPITAL_COMMUNITY): Payer: BLUE CROSS/BLUE SHIELD

## 2017-06-26 DIAGNOSIS — J9311 Primary spontaneous pneumothorax: Secondary | ICD-10-CM | POA: Diagnosis not present

## 2017-06-26 DIAGNOSIS — J9383 Other pneumothorax: Secondary | ICD-10-CM | POA: Diagnosis not present

## 2017-06-26 DIAGNOSIS — J939 Pneumothorax, unspecified: Secondary | ICD-10-CM

## 2017-06-26 HISTORY — DX: Pneumothorax, unspecified: J93.9

## 2017-06-26 LAB — CBC
HCT: 43.8 % (ref 36.0–46.0)
Hemoglobin: 13.8 g/dL (ref 12.0–15.0)
MCH: 29.5 pg (ref 26.0–34.0)
MCHC: 31.5 g/dL (ref 30.0–36.0)
MCV: 93.6 fL (ref 78.0–100.0)
Platelets: 199 10*3/uL (ref 150–400)
RBC: 4.68 MIL/uL (ref 3.87–5.11)
RDW: 13.9 % (ref 11.5–15.5)
WBC: 5.4 10*3/uL (ref 4.0–10.5)

## 2017-06-26 LAB — BASIC METABOLIC PANEL
Anion gap: 8 (ref 5–15)
BUN: 14 mg/dL (ref 6–20)
CHLORIDE: 102 mmol/L (ref 101–111)
CO2: 27 mmol/L (ref 22–32)
Calcium: 9 mg/dL (ref 8.9–10.3)
Creatinine, Ser: 0.84 mg/dL (ref 0.44–1.00)
GFR calc Af Amer: >60 mL/min (ref >60)
GFR calc non Af Amer: >60 mL/min (ref >60)
GLUCOSE: 119 mg/dL — AB (ref 65–99)
Potassium: 4.2 mmol/L (ref 3.5–5.1)
Sodium: 137 mmol/L (ref 135–145)

## 2017-06-26 LAB — HIV ANTIBODY (ROUTINE TESTING W REFLEX): HIV Screen 4th Generation wRfx: NONREACTIVE

## 2017-06-26 IMAGING — CR DG CHEST 2V
2 series · 2 of 2 positions shown · non-contrast
Comparison: 06/23/2017

CLINICAL DATA: Follow-up of pneumothorax.

EXAM:
CHEST - 2 VIEW

[chest pa]
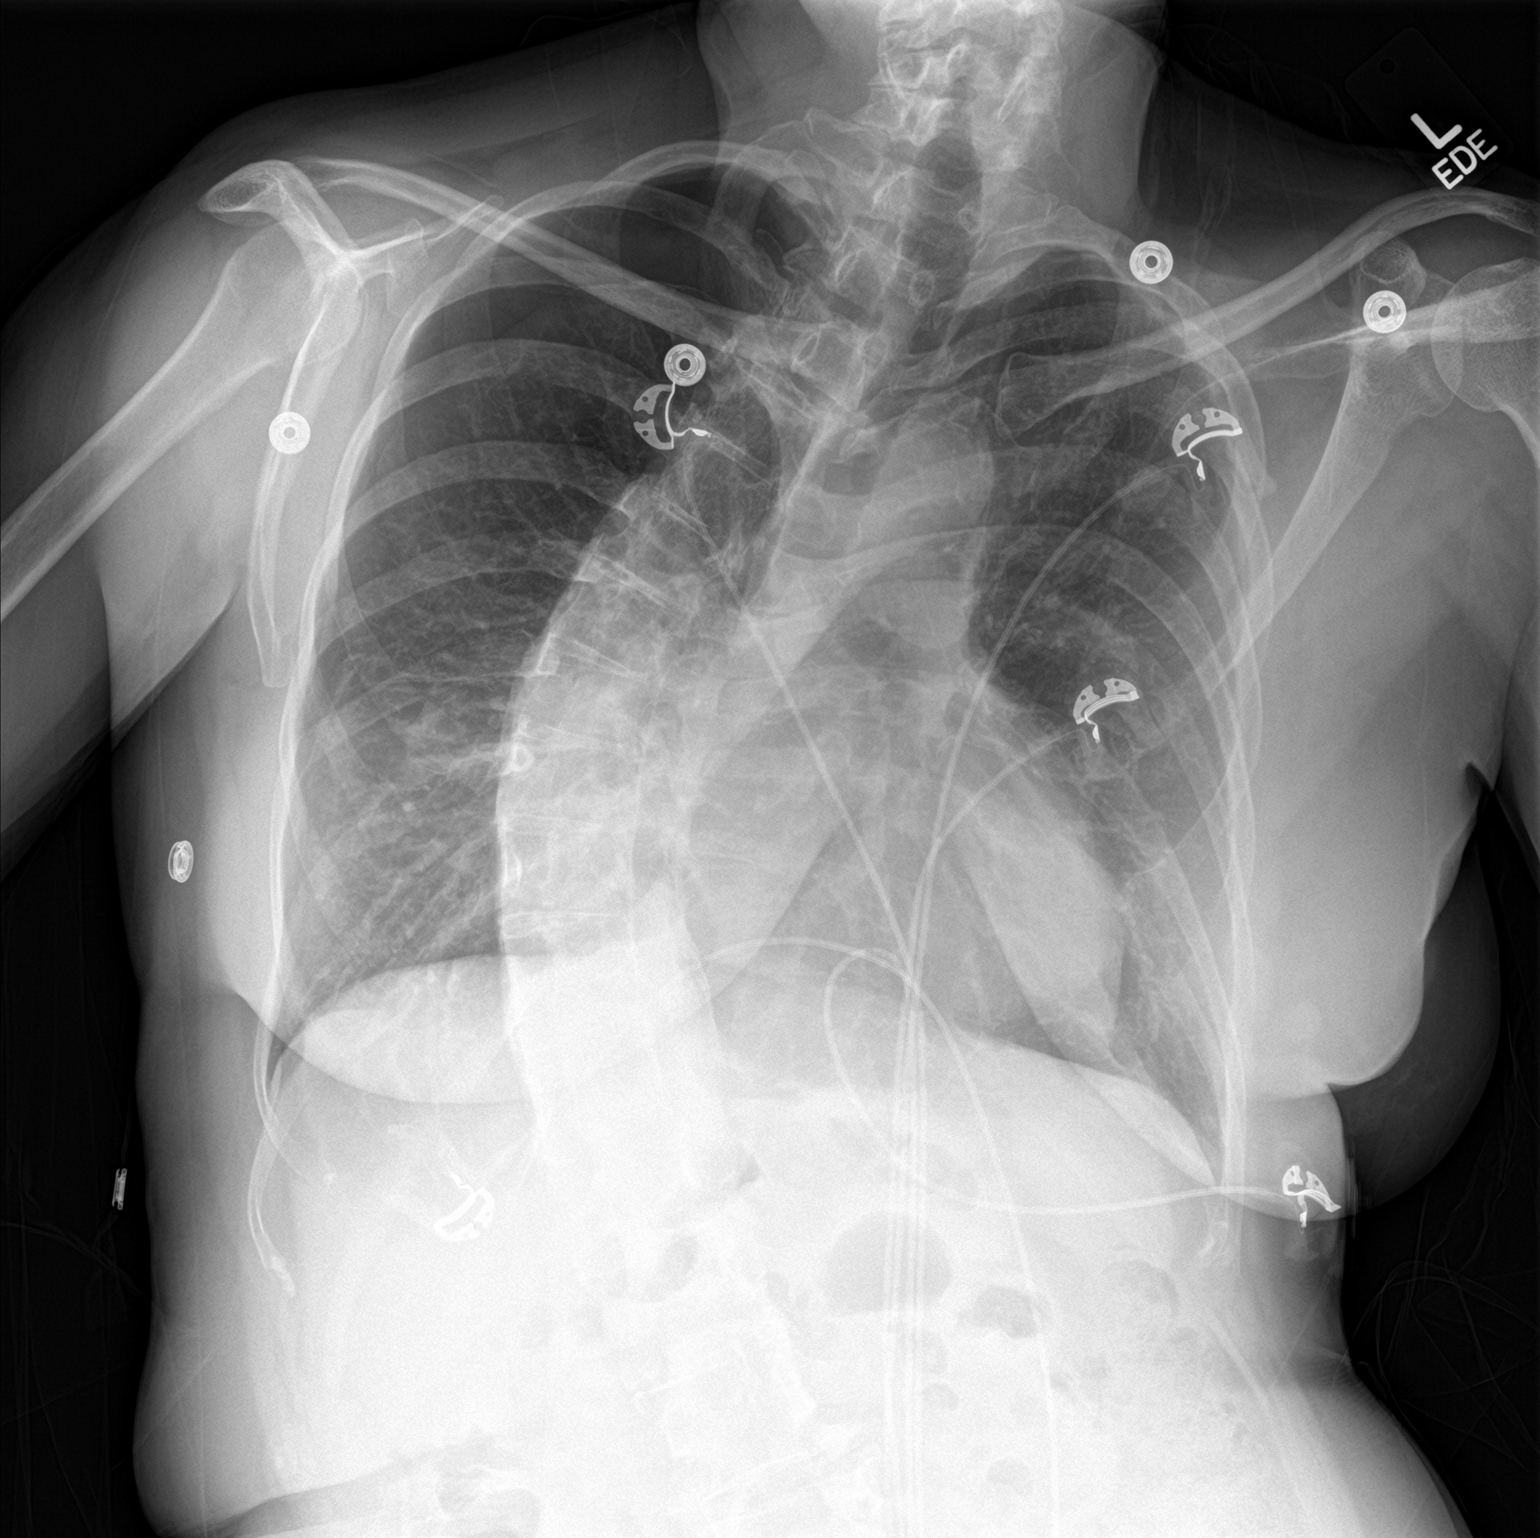

[chest lat]
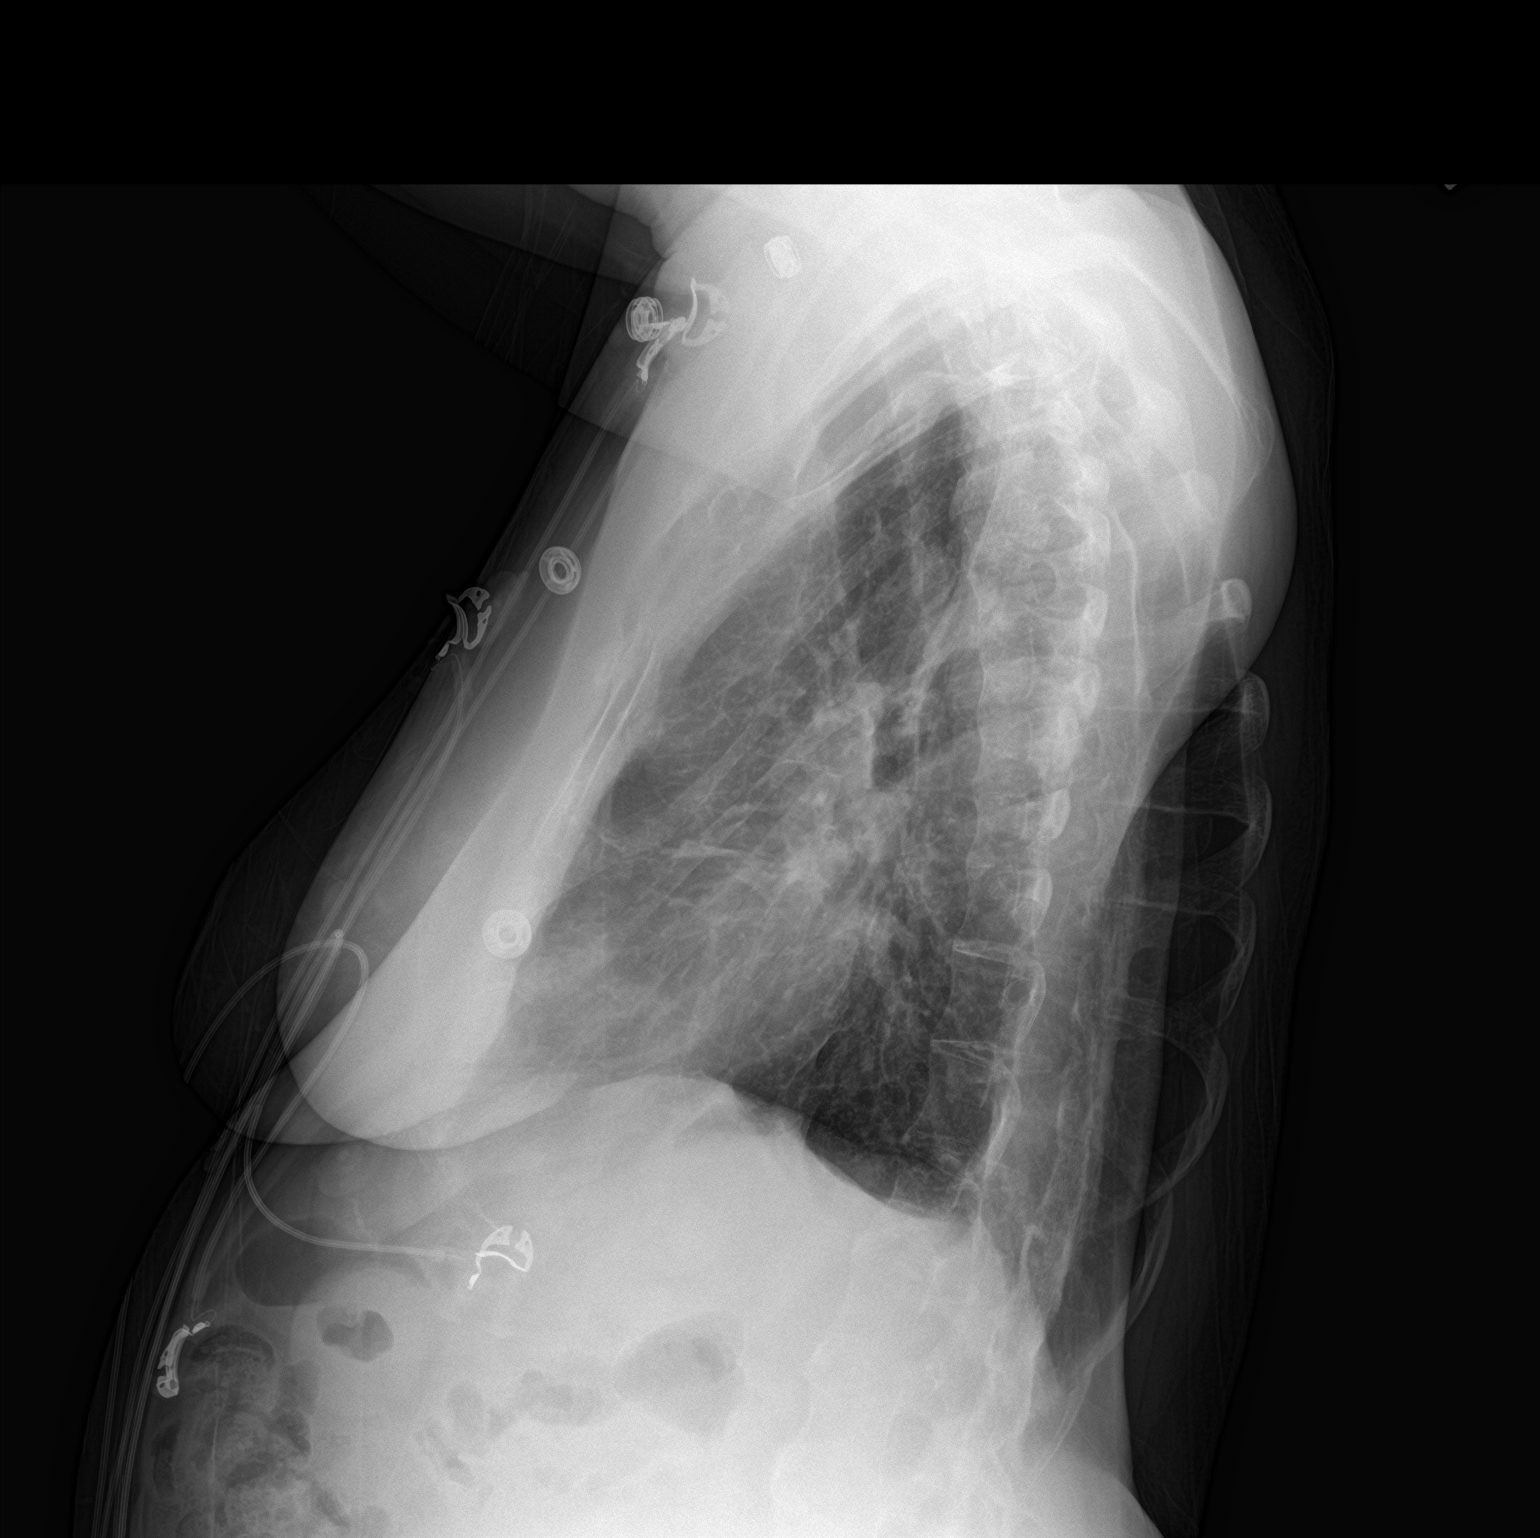

[2 of 2 positions shown; findings below may reference images not displayed]

FINDINGS: Moderate convex right thoracic spine curvature, distorting the
appearance of the chest on both views. Numerous leads and wires
project over the chest. Patient rotated to the left. Grossly normal
heart size and mediastinal contours. Atherosclerosis in the
transverse aorta. No pleural fluid. Right-sided pneumothorax again
identified. Visceral pleural line 3.0 cm from chest wall today
versus 3.2 cm on the prior exam (when remeasured). Subsegmental
atelectasis at the left lung base.
IMPRESSION: Minimal decrease in approximately 20% right apical pneumothorax.

Aortic Atherosclerosis ([id number]-J07.7).

## 2017-06-26 SURGERY — LEFT HEART CATH AND CORONARY ANGIOGRAPHY
Anesthesia: LOCAL

## 2017-06-26 NOTE — Progress Notes (Addendum)
PROGRESS NOTE    Leslie Roberson  YPP:[id number] DOB: Jan 01, 1956 DOA: 06/25/2017 PCP: Donella Stade, PA-C    Assessment & Plan:   Active Problems:   Pneumothorax Chest pain  62 year old female who was supposed to get heart catheterization today which is been delayed as she was found to have an acute pneumothorax.  CT surgery is seen the patient recommending no chest tube at this time.  Patient feels much better and is improving with her pleuritic chest pain.  Plan is to repeat chest x-ray in the morning and if stable to discharge home tomorrow.  Further catheterization workup per cardiology team.  This will likely be delayed due to new setting of a pneumothorax.  No incentive spirometry at bedside have ordered this.  Patient's oxygen sats have been over 95% on 2 L.  Patient in no respiratory distress.   DVT prophylaxis: SCDs  code Status: Full  family Communication: None  disposition Plan: Likely tomorrow  Consultants:  Cardiology CT surgery   Subjective: Patient feeling much better.  Less pain.  Objective: Vitals:   06/25/17 1734 06/25/17 1900 06/25/17 2118 06/26/17 0518  BP: 135/83 132/68 129/77 121/75  Pulse: 79 73 82 72  Resp: 18 19 18 18   Temp:  98.7 F (37.1 C) 98.6 F (37 C) 98.5 F (36.9 C)  TempSrc:  Oral Oral Oral  SpO2: 100% 100% 96% 99%    Intake/Output Summary (Last 24 hours) at 06/26/2017 1112 Last data filed at 06/26/2017 0519 Gross per 24 hour  Intake 420 ml  Output -  Net 420 ml   There were no vitals filed for this visit.  Examination:  General exam: Appears calm and comfortable  Respiratory system: Clear to auscultation. Respiratory effort normal. Cardiovascular system: S1 & S2 heard, RRR. No JVD, murmurs, rubs, gallops or clicks. No pedal edema. Gastrointestinal system: Abdomen is nondistended, soft and nontender. No organomegaly or masses felt. Normal bowel sounds heard. Central nervous system: Alert and oriented. No focal  neurological deficits. Extremities: Symmetric 5 x 5 power. Skin: No rashes, lesions or ulcers Psychiatry: Judgement and insight appear normal. Mood & affect appropriate.     Data Reviewed: I have personally reviewed following labs and imaging studies  CBC: Recent Labs  Lab 06/24/17 1540 06/25/17 1544 06/26/17 0520  WBC 7.1 7.0 5.4  NEUTROABS 4,622  --   --   HGB 14.9 15.0 13.8  HCT 43.0 46.3* 43.8  MCV 87.6 93.2 93.6  PLT 242 217 458   Basic Metabolic Panel: Recent Labs  Lab 06/24/17 1540 06/25/17 1544 06/26/17 0520  NA 140 141 137  K 4.5 3.8 4.2  CL 103 104 102  CO2 30 27 27   GLUCOSE 120* 145* 119*  BUN 18 17 14   CREATININE 1.24* 0.92 0.84  CALCIUM 10.0 9.7 9.0   GFR: Estimated Creatinine Clearance: 52.7 mL/min (by C-G formula based on SCr of 0.84 mg/dL). Liver Function Tests: No results for input(s): AST, ALT, ALKPHOS, BILITOT, PROT, ALBUMIN in the last 168 hours. No results for input(s): LIPASE, AMYLASE in the last 168 hours. No results for input(s): AMMONIA in the last 168 hours. Coagulation Profile: Recent Labs  Lab 06/24/17 1540  INR 1.0   Cardiac Enzymes: Recent Labs  Lab 06/25/17 2031  TROPONINI <0.03   BNP (last 3 results) No results for input(s): PROBNP in the last 8760 hours. HbA1C: No results for input(s): HGBA1C in the last 72 hours. CBG: No results for input(s): GLUCAP in the last 168  hours. Lipid Profile: No results for input(s): CHOL, HDL, LDLCALC, TRIG, CHOLHDL, LDLDIRECT in the last 72 hours. Thyroid Function Tests: No results for input(s): TSH, T4TOTAL, FREET4, T3FREE, THYROIDAB in the last 72 hours. Anemia Panel: No results for input(s): VITAMINB12, FOLATE, FERRITIN, TIBC, IRON, RETICCTPCT in the last 72 hours. Sepsis Labs: No results for input(s): PROCALCITON, LATICACIDVEN in the last 168 hours.  No results found for this or any previous visit (from the past 240 hour(s)).       Radiology Studies: Dg Chest 2  View  Result Date: 06/26/2017 CLINICAL DATA:  Follow-up of pneumothorax. EXAM: CHEST - 2 VIEW COMPARISON:  06/23/2017 FINDINGS: Moderate convex right thoracic spine curvature, distorting the appearance of the chest on both views. Numerous leads and wires project over the chest. Patient rotated to the left. Grossly normal heart size and mediastinal contours. Atherosclerosis in the transverse aorta. No pleural fluid. Right-sided pneumothorax again identified. Visceral pleural line 3.0 cm from chest wall today versus 3.2 cm on the prior exam (when remeasured). Subsegmental atelectasis at the left lung base. IMPRESSION: Minimal decrease in approximately 20% right apical pneumothorax. Aortic Atherosclerosis (ICD10-I70.0). Electronically Signed   By: Abigail Miyamoto M.D.   On: 06/26/2017 08:11   Dg Chest 2 View  Result Date: 06/25/2017 CLINICAL DATA:  Chest pain EXAM: CHEST - 2 VIEW COMPARISON:  09/01/2016 FINDINGS: Cardiac shadow is at the upper limits of normal in size. Scoliosis concave to the left is noted stable from the previous exam. Left lung is well aerated. There is evidence of a spontaneous pneumothorax on the right with approximately 3 cm excursion from the apex. No focal infiltrate is seen. No sizable effusion is noted. No acute bony abnormality is seen. IMPRESSION: New spontaneous pneumothorax on the right with approximately 3 cm excursion from the apex. Critical Value/emergent results were called by telephone at the time of interpretation on 06/25/2017 at 4:05 pm to Dr. Zenovia Jarred , who verbally acknowledged these results. Electronically Signed   By: Inez Catalina M.D.   On: 06/25/2017 16:09        Scheduled Meds: . aspirin  325 mg Oral Daily  . busPIRone  30 mg Oral BID  . clonazePAM  0.5 mg Oral QHS  . desvenlafaxine  150 mg Oral QHS  . escitalopram  20 mg Oral QHS  . levothyroxine  62.5 mcg Oral QAC breakfast  . pantoprazole  40 mg Oral Daily  . sodium chloride flush  3 mL  Intravenous Q12H  . sodium chloride flush  3 mL Intravenous Q12H   Continuous Infusions: . sodium chloride    . sodium chloride 1 mL/kg/hr (06/26/17 0709)     LOS: 1 day       Shira Bobst A, MD [facility] Pager [phone]  If 7PM-7AM, please contact night-coverage [email or web address] Password [facility] 06/26/2017, 11:12 AM  Patient ID: Delanna Blacketer Hashemi, female   DOB: [birth year], 62 y.o.   MRN: [id number]  Has spoken to [facility] with cardiology department.  Plan is for her to follow-up with them as an outpatient and they will reschedule and arrange for her have a heart cath again.  Her heart cath today has been canceled because of her pneumothorax.

## 2017-06-26 NOTE — Telephone Encounter (Signed)
Patient was ordered to have [place] by Dr. Stanford Breed on 06/24/17 This was cancelled d/t pneumothorax per chart review  Patient has been consulted on by Dr. Servando Snare She is admitted  Routed to MD/RN as Juluis Rainier

## 2017-06-26 NOTE — Telephone Encounter (Signed)
°  New message  Pt verbalized that she is calling for RN  She verbalized that she has a punctured lung but she was schedule   to have a LEFT HEART CATH AND CORONARY ANGIOGRAPHY   Pt want to know if there is any further instructions

## 2017-06-26 NOTE — Consult Note (Addendum)
[address]       [place] [address]             [phone]        Leslie Roberson [place] Medical Record #[id number] Date of Birth: 04-25-1955  Referring: Shanon Brow Primary Care: Lavada Mesi Primary Cardiologist: Stanford Breed  Chief Complaint:    Chief Complaint  Patient presents with  . Chest Pain    History of Present Illness:      Ms. Speece is a 62 yo white female with known history of Asthma, Scoliosis, thyroid disease and anxiety.  She presented to her PCP with complaints of chest pain.  She states this had been occurring over the past several months.  She was ultimately referred for a stress test which did not show any evidence abnormalities.  However, she continues to have exertional chest pain that is substernal in nature associated with shortness of breath and feeling that her heart is racing.  She was referred to Cardiology and evaluated by Dr. Stanford Breed who felt she should undergo cardiac catheterization which was scheduled for today.  However, in the interim the patient has been under the care of a physical therapist who recommended needling be performed due to continued pain.  The patient presented to the Emergency Department on 06/25/2017 with complaints of sudden onset shortness of breath.  CXR was obtained in the ED and showed evidence of pneumothorax.  She was admitted for further care and CT surgery was consulted for possible chest tube placement.  The patient is currently stable.  She states that she is tired as she didn't sleep well last night.  She denies acute chest pain and shortness of breath.  She questioned if she will still get her catheterization performed today.  Current Activity/ Functional Status: Patient is independent with mobility/ambulation, transfers, ADL's, IADL's.   Past Medical History:  Diagnosis Date  . Anxiety   . Asthma, chronic 11/27/2015  . Depression   . Diverticulitis of colon 12/21/2015   Colonoscopy  every 5 years/digestive health.   . GERD (gastroesophageal reflux disease) 11/27/2015  . Idiopathic scoliosis 01/22/2016  . Osteoporosis 11/27/2015   On prolia.   . Thyroid disease     Past Surgical History:  Procedure Laterality Date  . ABDOMINAL HYSTERECTOMY    . bladder mesh    . ELBOW SURGERY Right   . SPINAL FUSION    . TONSILLECTOMY    . TOTAL HIP ARTHROPLASTY      Social History   Tobacco Use  Smoking Status Never Smoker  Smokeless Tobacco Never Used    Social History   Substance and Sexual Activity  Alcohol Use Yes   Comment: Occasional    Social History   Socioeconomic History  . Marital status: Married    Spouse name: Not on file  . Number of children: 1  . Years of education: Not on file  . Highest education level: Not on file  Occupational History  . Not on file  Social Needs  . Financial resource strain: Not on file  . Food insecurity:    Worry: Not on file    Inability: Not on file  . Transportation needs:    Medical: Not on file    Non-medical: Not on file  Tobacco Use  . Smoking status: Never Smoker  . Smokeless tobacco: Never Used  Substance and Sexual Activity  . Alcohol use: Yes    Comment: Occasional  . Drug  use: No  . Sexual activity: Yes  Lifestyle  . Physical activity:    Days per week: Not on file    Minutes per session: Not on file  . Stress: Not on file  Relationships  . Social connections:    Talks on phone: Not on file    Gets together: Not on file    Attends religious service: Not on file    Active member of club or organization: Not on file    Attends meetings of clubs or organizations: Not on file    Relationship status: Not on file  . Intimate partner violence:    Fear of current or ex partner: Not on file    Emotionally abused: Not on file    Physically abused: Not on file    Forced sexual activity: Not on file  Other Topics Concern  . Not on file  Social History Narrative  . Not on file    Allergies    Allergen Reactions  . Hydromorphone Rash  . Levofloxacin Other (See Comments)    Hallucinations  . Meperidine Nausea And Vomiting and Other (See Comments)    Also "doesn't work well for my pain"  . Broccoli [Brassica Oleracea Italica] Nausea Only and Other (See Comments)    Causes stomach pain also  . Adhesive [Tape] Rash    Current Facility-Administered Medications  Medication Dose Route Frequency Provider Last Rate Last Dose  . 0.9 %  sodium chloride infusion  250 mL Intravenous PRN Lelon Perla, MD      . 0.9% sodium chloride infusion  1 mL/kg/hr Intravenous Continuous Elwin Mocha, MD 57.3 mL/hr at 06/26/17 0709 1 mL/kg/hr at 06/26/17 0709  . acetaminophen (TYLENOL) tablet 650 mg  650 mg Oral Q6H PRN Elwin Mocha, MD       Or  . acetaminophen (TYLENOL) suppository 650 mg  650 mg Rectal Q6H PRN Elwin Mocha, MD      . aspirin tablet 325 mg  325 mg Oral Daily Elwin Mocha, MD   325 mg at 06/26/17 0954  . busPIRone (BUSPAR) tablet 30 mg  30 mg Oral BID Elwin Mocha, MD   30 mg at 06/26/17 0955  . clonazePAM (KLONOPIN) tablet 0.5 mg  0.5 mg Oral QHS Elwin Mocha, MD   0.5 mg at 06/25/17 2152  . desvenlafaxine (PRISTIQ) 24 hr tablet 150 mg  150 mg Oral QHS Elwin Mocha, MD   150 mg at 06/25/17 2153  . escitalopram (LEXAPRO) tablet 20 mg  20 mg Oral QHS Elwin Mocha, MD   20 mg at 06/25/17 2151  . levothyroxine (SYNTHROID, LEVOTHROID) tablet 62.5 mcg  62.5 mcg Oral QAC breakfast Elwin Mocha, MD   62.5 mcg at 06/26/17 0955  . lidocaine (LIDODERM) 5 % 1 patch  1 patch Transdermal Daily PRN Elwin Mocha, MD      . nitroGLYCERIN (NITROSTAT) SL tablet 0.4 mg  0.4 mg Sublingual Q5 min PRN Elwin Mocha, MD      . ondansetron [facility]) tablet 4 mg  4 mg Oral Q6H PRN Elwin Mocha, MD       Or  . ondansetron [facility]) injection 4 mg  4 mg Intravenous Q6H PRN Elwin Mocha, MD      . pantoprazole (PROTONIX) EC tablet 40 mg  40 mg Oral Daily  Elwin Mocha, MD   40 mg at 06/26/17 0954  . sodium chloride flush (NS) 0.9 % injection  3 mL  3 mL Intravenous Q12H Elwin Mocha, MD   3 mL at 06/25/17 2018  . sodium chloride flush (NS) 0.9 % injection 3 mL  3 mL Intravenous Q12H Lelon Perla, MD   3 mL at 06/26/17 0956  . sodium chloride flush (NS) 0.9 % injection 3 mL  3 mL Intravenous PRN Lelon Perla, MD        Medications Prior to Admission  Medication Sig Dispense Refill Last Dose  . albuterol (PROVENTIL HFA;VENTOLIN HFA) 108 (90 Base) MCG/ACT inhaler Inhale 1-2 puffs into the lungs every 4 (four) hours as needed for wheezing or shortness of breath. 1 Inhaler 3 Unk at Unk  . aspirin EC 81 MG tablet Take 81 mg by mouth at bedtime.   06/24/2017 at 2230  . busPIRone (BUSPAR) 30 MG tablet TAKE 1 TABLET BY MOUTH TWICE A DAY (Patient taking differently: Take 30 mg by mouth two times a day) 60 tablet 3 06/25/2017 at am  . calcium-vitamin D (OSCAL WITH D) 500-200 MG-UNIT tablet Take 1 tablet by mouth daily.   06/25/2017 at Unknown time  . clobetasol (TEMOVATE) 0.05 % external solution Apply 1 application topically 2 (two) times daily. (Patient taking differently: Apply 1 application topically 2 (two) times daily as needed (for itching). ) 50 mL 1 Unk at Unk  . clonazePAM (KLONOPIN) 0.5 MG tablet Take 1 tablet (0.5 mg total) by mouth daily as needed for anxiety. (Patient taking differently: Take 0.5 mg by mouth at bedtime. ) 30 tablet 1 06/24/2017 at pm  . denosumab (PROLIA) 60 MG/ML SOLN injection Inject 60 mg into the skin every 6 (six) months. Administer in upper arm, thigh, or abdomen   05/2017 at 05/2017  . desvenlafaxine (PRISTIQ) 100 MG 24 hr tablet TAKE 1 TABLET BY MOUTH EVERY DAY (Patient taking differently: Take 100 mg by mouth at bedtime) 30 tablet 4 06/24/2017 at pm  . desvenlafaxine (PRISTIQ) 50 MG 24 hr tablet TAKE ONE TABLET BY MOUTH DAILY (Patient taking differently: Take 50 mg by mouth at bedtime) 90 tablet 1 06/24/2017 at  pm  . dexlansoprazole (DEXILANT) 60 MG capsule TAKE 1 CAPSULE BY MOUTH EVERY DAY (Patient taking differently: Take 60 mg by mouth daily. ) 90 capsule 3 06/25/2017 at am  . diclofenac sodium (VOLTAREN) 1 % GEL APPLY 4G TOPICALLY 4 TIMES DAILY (Patient taking differently: Apply 4 g topically 4 (four) times daily as needed (to affected painful sites). ) 100 g 12 Unk at Unk  . escitalopram (LEXAPRO) 10 MG tablet Take 20 mg by mouth at bedtime.    06/24/2017 at pm  . flunisolide (NASAREL) 29 MCG/ACT (0.025%) nasal spray Place 2 sprays into the nose daily as needed for rhinitis.    Unk at [employer]  . gabapentin (NEURONTIN) 300 MG capsule TAKE 1 CAP AT BEDTIME X1 WEEK,1 CAP TWICE DAILY X1 WEEK THEN 1 CAP 3X DAILY MAY DOUBLE WEEKLY=3600MG  (Patient taking differently: Take 300 mg by mouth in the morning and 600 mg at bedtime) 180 capsule 3 06/25/2017 at am  . hydrOXYzine (ATARAX/VISTARIL) 25 MG tablet Take 1 tablet (25 mg total) by mouth 3 (three) times daily as needed. For chest tightness and/or scalp itching (Patient taking differently: Take 25 mg by mouth at bedtime. ) 30 tablet 1 06/24/2017 at pm  . levothyroxine (SYNTHROID, LEVOTHROID) 125 MCG tablet TAKE ONE-HALF TABLET BY MOUTH ONCE DAILY (Patient taking differently: Take 62.5 mcg by mouth once a day) 30 tablet 1 06/25/2017 at am  .  lidocaine (LIDODERM) 5 % Place 1 patch onto the skin daily. Remove & Discard patch within 12 hours or as directed by MD (Patient taking differently: Place 1 patch onto the skin daily as needed (for pain). Remove & Discard patch within 12 hours or as directed by MD) 30 patch 12 Unk at Unk  . Menthol, Topical Analgesic, (MINERAL ICE EX) Apply 1 application to affected sites one to two times a day as needed (for pain)   Past Month at Unknown time  . Omega-3 Fatty Acids (FISH OIL PO) Take 1 capsule by mouth daily.    06/25/2017 at am  . pantoprazole (PROTONIX) 40 MG tablet Take 1 tablet (40 mg total) by mouth daily. (Patient taking  differently: Take 40 mg by mouth at bedtime. ) 90 tablet 1 06/24/2017 at pm  . traZODone (DESYREL) 50 MG tablet TAKE 1 TABLET (50 MG TOTAL) BY MOUTH AT BEDTIME. 90 tablet 1 06/24/2017 at pm  . HYDROcodone-acetaminophen (NORCO/VICODIN) 5-325 MG tablet Take 1 tablet by mouth every 8 (eight) hours as needed for moderate pain. After toenail removal. (Patient not taking: Reported on 06/25/2017) 10 tablet 0 Not Taking at Unknown time  . ipratropium (ATROVENT) 0.06 % nasal spray Place 2 sprays into both nostrils 4 (four) times daily. (Patient not taking: Reported on 06/25/2017) 15 mL 1 Not Taking at Unknown time  . meloxicam (MOBIC) 15 MG tablet Take 15 mg by mouth daily.    On hold at On hold  . naproxen sodium (ANAPROX) 220 MG tablet Take 220 mg by mouth as needed (pain).    On hold at On hold  . nortriptyline (PAMELOR) 25 MG capsule TAKE 1 CAPSULE (25 MG TOTAL) BY MOUTH AT BEDTIME (Patient not taking: Reported on 06/25/2017) 30 capsule 1 Not Taking at Unknown time  . triamcinolone ointment (KENALOG) 0.1 % Apply 1 application topically 2 (two) times daily. To affected area(s) as needed. Use no longer than 1-2 weeks. (Patient not taking: Reported on 06/25/2017) 30 g 0 Not Taking at Unknown time    Family History  Problem Relation Age of Onset  . Cancer Mother        lung  . Depression Mother   . Heart attack Father   . Hyperlipidemia Father   . Hypertension Father   . Diabetes Maternal Aunt   . Hyperlipidemia Paternal Aunt   . COPD Paternal Aunt    Review of Systems:   ROS Constitutional: negative Respiratory: positive for asthma and pneumothorax on the right Cardiovascular: positive for exertional chest pressure/discomfort Gastrointestinal: negative Musculoskeletal:positive for scoliosis Neurological: negative     Cardiac Review of Systems: Y or  [    ]= no  Chest Pain [  Y, exertional  ]  Resting SOB [   ] Exertional SOB  [  ]  Orthopnea [  ]   Pedal Edema [   ]    Palpitations [  ] Syncope   [  ]   Presyncope [   ]  General Review of Systems: [Y] = yes [  ]=no Constitional: recent weight change [ n ]; anorexia [  ]; fatigue [  ]; nausea [  ]; night sweats [  ]; fever [  ]; or chills [  ]  Dental: poor dentition[  ]; Last Dentist visit:   Eye : blurred vision [  ]; diplopia [   ]; vision changes [  ];  Amaurosis fugax[  ]; Resp: cough [  ];  wheezing[  ];  hemoptysis[  ]; shortness of breath[ y ]; paroxysmal nocturnal dyspnea[  ]; dyspnea on exertion[  ]; or orthopnea[  ];  GI:  gallstones[  ], vomiting[  ];  dysphagia[  ]; melena[  ];  hematochezia [  ]; heartburn[  ];   Hx of  Colonoscopy[  ]; GU: kidney stones [  ]; hematuria[  ];   dysuria [  ];  nocturia[  ];  history of     obstruction [  ]; urinary frequency [  ]             Skin: rash, swelling[  ];, hair loss[  ];  peripheral edema[  ];  or itching[  ]; Musculosketetal: myalgias[  ];  joint swelling[  ];  joint erythema[  ];  joint pain[  ];  back pain[ y ];  Heme/Lymph: bruising[  ];  bleeding[  ];  anemia[  ];  Neuro: TIA[  ];  headaches[ n ];  stroke[  ];  vertigo[  ];  seizures[  ];   paresthesias[  ];  difficulty walking[  ];  Psych:depression[  ]; anxiety[y  ];  Endocrine: diabetes[  ];  thyroid dysfunction[ y ];  Immunizations: Flu [  ]; Pneumococcal[  ];    Physical Exam: BP 121/75 (BP Location: Right Arm)   Pulse 72   Temp 98.5 F (36.9 C) (Oral)   Resp 18   SpO2 99%    General appearance: alert, cooperative and no distress Head: Normocephalic, without obvious abnormality, atraumatic Lymph nodes: Cervical, supraclavicular, and axillary nodes normal. Resp: clear to auscultation bilaterally Cardio: regular rate and rhythm GI: soft, non-tender; bowel sounds normal; no masses,  no organomegaly Extremities: extremities normal, atraumatic, no cyanosis or edema Neurologic: Grossly normal  Diagnostic Studies & Laboratory data:     Recent Radiology  Findings:   Dg Chest 2 View  Result Date: 06/26/2017 CLINICAL DATA:  Follow-up of pneumothorax. EXAM: CHEST - 2 VIEW COMPARISON:  06/23/2017 FINDINGS: Moderate convex right thoracic spine curvature, distorting the appearance of the chest on both views. Numerous leads and wires project over the chest. Patient rotated to the left. Grossly normal heart size and mediastinal contours. Atherosclerosis in the transverse aorta. No pleural fluid. Right-sided pneumothorax again identified. Visceral pleural line 3.0 cm from chest wall today versus 3.2 cm on the prior exam (when remeasured). Subsegmental atelectasis at the left lung base. IMPRESSION: Minimal decrease in approximately 20% right apical pneumothorax. Aortic Atherosclerosis (ICD10-I70.0). Electronically Signed   By: Abigail Miyamoto M.D.   On: 06/26/2017 08:11   Dg Chest 2 View  Result Date: 06/25/2017 CLINICAL DATA:  Chest pain EXAM: CHEST - 2 VIEW COMPARISON:  09/01/2016 FINDINGS: Cardiac shadow is at the upper limits of normal in size. Scoliosis concave to the left is noted stable from the previous exam. Left lung is well aerated. There is evidence of a spontaneous pneumothorax on the right with approximately 3 cm excursion from the apex. No focal infiltrate is seen. No sizable effusion is noted. No acute bony abnormality is seen. IMPRESSION: New spontaneous pneumothorax on the right with approximately 3 cm excursion from the apex. Critical Value/emergent results were called by telephone at the time of interpretation on 06/25/2017 at 4:05 pm to Dr.  Zenovia Jarred , who verbally acknowledged these results. Electronically Signed   By: Inez Catalina M.D.   On: 06/25/2017 16:09     I have independently reviewed the above radiologic studies.  Recent Lab Findings: Lab Results  Component Value Date   WBC 5.4 06/26/2017   HGB 13.8 06/26/2017   HCT 43.8 06/26/2017   PLT 199 06/26/2017   GLUCOSE 119 (H) 06/26/2017   CHOL 185 02/03/2017   TRIG 90  02/03/2017   HDL 60 02/03/2017   LDLCALC 107 (H) 02/03/2017   ALT 38 (H) 02/03/2017   AST 23 02/03/2017   NA 137 06/26/2017   K 4.2 06/26/2017   CL 102 06/26/2017   CREATININE 0.84 06/26/2017   BUN 14 06/26/2017   CO2 27 06/26/2017   TSH 1.67 02/03/2017   INR 1.0 06/24/2017   HGBA1C 6.3 02/05/2017    Assessment / Plan:      1. Spontaneous Right Sided Pneumothorax- likely due to recent needling procedure for pain relief... Pneumothorax is small and no chest tube is indicated at this time.   Continue supportive therapy and repeat CXR in AM 2. Chest pain- stress test was negative, suppose to have cardiac cath today, patient currently chest pain free 3. Dispo- care per primary, no need for chest tube placement at this time.   Ellwood Handler, PA-C  06/26/2017 9:56 AM   Patient seen her history reviewed with her and examined.  Reviewed the case with the emergency room physician last night and have reviewed x-ray last night and today.  Limited right apical pneumothorax possibly related to acupuncture needles to the right posterior chest yesterday.  At this point I would continue to follow the patient has a pneumothorax is not enlarged and is limited to the right apex.  She will need a follow-up film to ensure continued resolution.  I discussed the options with her and she is agreeable with this approach and understands the diagnosis.     Grace Isaac MD Beeper [phone] Office [phone] 06/26/2017 1:11 PM

## 2017-06-27 ENCOUNTER — Observation Stay (HOSPITAL_COMMUNITY): Payer: BLUE CROSS/BLUE SHIELD

## 2017-06-27 DIAGNOSIS — J9311 Primary spontaneous pneumothorax: Principal | ICD-10-CM

## 2017-06-27 DIAGNOSIS — R0602 Shortness of breath: Secondary | ICD-10-CM | POA: Diagnosis not present

## 2017-06-27 DIAGNOSIS — R079 Chest pain, unspecified: Secondary | ICD-10-CM | POA: Diagnosis not present

## 2017-06-27 NOTE — Progress Notes (Signed)
1520 Patient discharged to home. Verbalizes understanding of all discharge instructions-follow up MD visits. Patient accompanied by husband.

## 2017-06-27 NOTE — Discharge Summary (Signed)
Physician Discharge Summary  Leslie Roberson BSW:[id number] DOB: 1955/05/11 DOA: 06/25/2017  PCP: Donella Stade, PA-C  Admit date: 06/25/2017 Discharge date: 06/27/2017  Time spent: 33 minutes  Recommendations for Outpatient Follow-up:  1. Follow-up with CT surgery    Discharge Diagnoses:  Active Problems:   Pneumothorax   Discharge Condition: Good  Diet recommendation: Regular  Filed Weights   06/27/17 0531  Weight: 60.5 kg (133 lb 6.1 oz)    History of present illness:  Leslie Roberson is a 62 y.o. female with past medical history significant for asthma, thyroid disease and anxiety presents the emergency room with shortness of breath and chest pain.  Patient states that she was at her physical therapy appointment getting needling for pain.  Therapist informed her that the needle could cause alignment collapse.  During the treatment patient developed chest pain shortness of breath.   Hospital Course:  Patient was admitted and chest tube was supposed to be inserted.  Chest x-ray showed pneumothorax on CT surgeon recommended conservative measures.  It was about 20% in the beginning.  Improved to 15%.  Patient was asymptomatic.  Ultimately plan was to discharge patient home without chest tube.  She will have outpatient follow-up with CT surgery.  Procedures:  Consultations: CT surgery  Discharge Exam: Vitals:   06/26/17 2059 06/27/17 0531  BP: 131/76 139/71  Pulse: 83 68  Resp: 18 18  Temp: 98.2 F (36.8 C) 98.6 F (37 C)  SpO2: 100% 99%    General: Stable, NAD Cardiovascular: RRR Respiratory: Good AE bilaterally  Discharge Instructions   Discharge Instructions    Diet - low sodium heart healthy   Complete by:  As directed    Increase activity slowly   Complete by:  As directed      Allergies as of 06/27/2017      Reactions   Hydromorphone Rash   Levofloxacin Other (See Comments)   Hallucinations   Meperidine Nausea And Vomiting, Other (See  Comments)   Also "doesn't work well for my pain"   Broccoli [brassica Oleracea Italica] Nausea Only, Other (See Comments)   Causes stomach pain also   Adhesive [tape] Rash      Medication List    TAKE these medications   albuterol 108 (90 Base) MCG/ACT inhaler Commonly known as:  PROVENTIL HFA;VENTOLIN HFA Inhale 1-2 puffs into the lungs every 4 (four) hours as needed for wheezing or shortness of breath.   aspirin EC 81 MG tablet Take 81 mg by mouth at bedtime.   busPIRone 30 MG tablet Commonly known as:  BUSPAR TAKE 1 TABLET BY MOUTH TWICE A DAY What changed:    how much to take  how to take this  when to take this   calcium-vitamin D 500-200 MG-UNIT tablet Commonly known as:  OSCAL WITH D Take 1 tablet by mouth daily.   clobetasol 0.05 % external solution Commonly known as:  TEMOVATE Apply 1 application topically 2 (two) times daily. What changed:    when to take this  reasons to take this   clonazePAM 0.5 MG tablet Commonly known as:  KLONOPIN Take 1 tablet (0.5 mg total) by mouth daily as needed for anxiety. What changed:  when to take this   denosumab 60 MG/ML Soln injection Commonly known as:  PROLIA Inject 60 mg into the skin every 6 (six) months. Administer in upper arm, thigh, or abdomen   desvenlafaxine 50 MG 24 hr tablet Commonly known as:  PRISTIQ TAKE  ONE TABLET BY MOUTH DAILY What changed:    how much to take  how to take this  when to take this   desvenlafaxine 100 MG 24 hr tablet Commonly known as:  PRISTIQ TAKE 1 TABLET BY MOUTH EVERY DAY What changed:    how much to take  how to take this  when to take this   dexlansoprazole 60 MG capsule Commonly known as:  DEXILANT TAKE 1 CAPSULE BY MOUTH EVERY DAY What changed:    how much to take  how to take this  when to take this  additional instructions   diclofenac sodium 1 % Gel Commonly known as:  VOLTAREN APPLY 4G TOPICALLY 4 TIMES DAILY What changed:    how much  to take  how to take this  when to take this  reasons to take this  additional instructions   escitalopram 10 MG tablet Commonly known as:  LEXAPRO Take 20 mg by mouth at bedtime.   FISH OIL PO Take 1 capsule by mouth daily.   flunisolide 29 MCG/ACT nasal spray Commonly known as:  NASAREL Place 2 sprays into the nose daily as needed for rhinitis.   gabapentin 300 MG capsule Commonly known as:  NEURONTIN TAKE 1 CAP AT BEDTIME X1 WEEK,1 CAP TWICE DAILY X1 WEEK THEN 1 CAP 3X DAILY MAY DOUBLE WEEKLY=3600MG  What changed:  See the new instructions.   HYDROcodone-acetaminophen 5-325 MG tablet Commonly known as:  NORCO/VICODIN Take 1 tablet by mouth every 8 (eight) hours as needed for moderate pain. After toenail removal.   hydrOXYzine 25 MG tablet Commonly known as:  ATARAX/VISTARIL Take 1 tablet (25 mg total) by mouth 3 (three) times daily as needed. For chest tightness and/or scalp itching What changed:    when to take this  additional instructions   ipratropium 0.06 % nasal spray Commonly known as:  ATROVENT Place 2 sprays into both nostrils 4 (four) times daily.   levothyroxine 125 MCG tablet Commonly known as:  SYNTHROID, LEVOTHROID TAKE ONE-HALF TABLET BY MOUTH ONCE DAILY What changed:    how much to take  how to take this  when to take this   lidocaine 5 % Commonly known as:  LIDODERM Place 1 patch onto the skin daily. Remove & Discard patch within 12 hours or as directed by MD What changed:    when to take this  reasons to take this  additional instructions   meloxicam 15 MG tablet Commonly known as:  MOBIC Take 15 mg by mouth daily.   MINERAL ICE EX Apply 1 application to affected sites one to two times a day as needed (for pain)   naproxen sodium 220 MG tablet Commonly known as:  ALEVE Take 220 mg by mouth as needed (pain).   nortriptyline 25 MG capsule Commonly known as:  PAMELOR TAKE 1 CAPSULE (25 MG TOTAL) BY MOUTH AT BEDTIME    pantoprazole 40 MG tablet Commonly known as:  PROTONIX Take 1 tablet (40 mg total) by mouth daily. What changed:  when to take this   traZODone 50 MG tablet Commonly known as:  DESYREL TAKE 1 TABLET (50 MG TOTAL) BY MOUTH AT BEDTIME.   triamcinolone ointment 0.1 % Commonly known as:  KENALOG Apply 1 application topically 2 (two) times daily. To affected area(s) as needed. Use no longer than 1-2 weeks.      Allergies  Allergen Reactions  . Hydromorphone Rash  . Levofloxacin Other (See Comments)    Hallucinations  . Meperidine  Nausea And Vomiting and Other (See Comments)    Also "doesn't work well for my pain"  . Broccoli [Brassica Oleracea Italica] Nausea Only and Other (See Comments)    Causes stomach pain also  . Adhesive [Tape] Rash      The results of significant diagnostics from this hospitalization (including imaging, microbiology, ancillary and laboratory) are listed below for reference.    Significant Diagnostic Studies: Dg Chest 2 View  Result Date: 06/27/2017 CLINICAL DATA:  Shortness of breath and chest pain for 2 days. Follow-up pneumothorax. EXAM: CHEST - 2 VIEW COMPARISON:  06/26/2017 and prior chest radiographs FINDINGS: A RIGHT apical pneumothorax has decreased in size, now 10-15%. Cardiomediastinal silhouette is unchanged. A severe thoracolumbar scoliosis again noted with mild RIGHT basilar atelectasis. No pleural effusion. IMPRESSION: Slightly decreased RIGHT apical pneumothorax now 10-15%, previously 20%. Electronically Signed   By: Margarette Canada M.D.   On: 06/27/2017 14:49   Dg Chest 2 View  Result Date: 06/26/2017 CLINICAL DATA:  Follow-up of pneumothorax. EXAM: CHEST - 2 VIEW COMPARISON:  06/23/2017 FINDINGS: Moderate convex right thoracic spine curvature, distorting the appearance of the chest on both views. Numerous leads and wires project over the chest. Patient rotated to the left. Grossly normal heart size and mediastinal contours. Atherosclerosis in  the transverse aorta. No pleural fluid. Right-sided pneumothorax again identified. Visceral pleural line 3.0 cm from chest wall today versus 3.2 cm on the prior exam (when remeasured). Subsegmental atelectasis at the left lung base. IMPRESSION: Minimal decrease in approximately 20% right apical pneumothorax. Aortic Atherosclerosis (ICD10-I70.0). Electronically Signed   By: Abigail Miyamoto M.D.   On: 06/26/2017 08:11   Dg Chest 2 View  Result Date: 06/25/2017 CLINICAL DATA:  Chest pain EXAM: CHEST - 2 VIEW COMPARISON:  09/01/2016 FINDINGS: Cardiac shadow is at the upper limits of normal in size. Scoliosis concave to the left is noted stable from the previous exam. Left lung is well aerated. There is evidence of a spontaneous pneumothorax on the right with approximately 3 cm excursion from the apex. No focal infiltrate is seen. No sizable effusion is noted. No acute bony abnormality is seen. IMPRESSION: New spontaneous pneumothorax on the right with approximately 3 cm excursion from the apex. Critical Value/emergent results were called by telephone at the time of interpretation on 06/25/2017 at 4:05 pm to Dr. Zenovia Jarred , who verbally acknowledged these results. Electronically Signed   By: Inez Catalina M.D.   On: 06/25/2017 16:09    Microbiology: No results found for this or any previous visit (from the past 240 hour(s)).   Labs: Basic Metabolic Panel: Recent Labs  Lab 06/24/17 1540 06/25/17 1544 06/26/17 0520  NA 140 141 137  K 4.5 3.8 4.2  CL 103 104 102  CO2 30 27 27   GLUCOSE 120* 145* 119*  BUN 18 17 14   CREATININE 1.24* 0.92 0.84  CALCIUM 10.0 9.7 9.0   Liver Function Tests: No results for input(s): AST, ALT, ALKPHOS, BILITOT, PROT, ALBUMIN in the last 168 hours. No results for input(s): LIPASE, AMYLASE in the last 168 hours. No results for input(s): AMMONIA in the last 168 hours. CBC: Recent Labs  Lab 06/24/17 1540 06/25/17 1544 06/26/17 0520  WBC 7.1 7.0 5.4  NEUTROABS  4,622  --   --   HGB 14.9 15.0 13.8  HCT 43.0 46.3* 43.8  MCV 87.6 93.2 93.6  PLT 242 217 199   Cardiac Enzymes: Recent Labs  Lab 06/25/17 2031  TROPONINI <0.03  BNP: BNP (last 3 results) No results for input(s): BNP in the last 8760 hours.  ProBNP (last 3 results) No results for input(s): PROBNP in the last 8760 hours.  CBG: No results for input(s): GLUCAP in the last 168 hours.     SignedBarbette Merino MD.  [facility] 06/27/2017, 2:55 PM

## 2017-06-29 ENCOUNTER — Telehealth: Payer: Self-pay | Admitting: Cardiology

## 2017-06-29 NOTE — Telephone Encounter (Signed)
Routed to [employer]

## 2017-06-29 NOTE — Telephone Encounter (Signed)
°  New message  Pt verbalized that she is calling for RN  She is ready to reschedule her Heart Cath

## 2017-06-30 ENCOUNTER — Ambulatory Visit: Payer: BLUE CROSS/BLUE SHIELD | Admitting: Rehabilitative and Restorative Service Providers"

## 2017-06-30 DIAGNOSIS — R531 Weakness: Secondary | ICD-10-CM

## 2017-06-30 DIAGNOSIS — M545 Low back pain, unspecified: Secondary | ICD-10-CM

## 2017-06-30 DIAGNOSIS — R29898 Other symptoms and signs involving the musculoskeletal system: Secondary | ICD-10-CM | POA: Diagnosis not present

## 2017-06-30 NOTE — Telephone Encounter (Signed)
Schedule fu Kirk Ruths

## 2017-06-30 NOTE — Telephone Encounter (Signed)
Spoke with pt, she reports no recurrent chest pain but she has also not been exerting herself. Will forward for dr Stanford Breed review for okay to reschedule cath or schedule follow up.

## 2017-06-30 NOTE — Therapy (Signed)
[facility] [place], [place], [address] Phone: [phone]   Fax:  [phone]  Physical Therapy Treatment  Patient Details  Name: Leslie Roberson MRN: [id number] Date of Birth: 03/25/1956 Referring Provider: Dr Lynne Leader    Encounter Date: 06/30/2017  PT End of Session - 06/30/17 1105    Visit Number  4    Number of Visits  12    Date for PT Re-Evaluation  07/29/17    PT Start Time  1103    PT Stop Time  1200    PT Time Calculation (min)  57 min       Past Medical History:  Diagnosis Date  . Anxiety   . Asthma, chronic 11/27/2015  . Depression   . Diverticulitis of colon 12/21/2015   Colonoscopy every 5 years/digestive health.   . GERD (gastroesophageal reflux disease) 11/27/2015  . Idiopathic scoliosis 01/22/2016  . Osteoporosis 11/27/2015   On prolia.   . Pneumothorax 06/26/2017  . Pre-diabetes   . Thyroid disease     Past Surgical History:  Procedure Laterality Date  . ABDOMINAL HYSTERECTOMY    . bladder mesh    . ELBOW SURGERY Right   . SPINAL FUSION    . TONSILLECTOMY    . TOTAL HIP ARTHROPLASTY      There were no vitals filed for this visit.  Subjective Assessment - 06/30/17 1106    Subjective  Patient reports that she is doing better. She has no pain in the shoulder blade area but some continued tightness in the Rt lumbar area. She would like to continue with the DN and manual work to this area. She has an appointment to see MD and schedule cardiac cath.     Currently in Pain?  Yes    Pain Score  2     Pain Location  Back    Pain Orientation  Right;Lower    Pain Descriptors / Indicators  Aching    Pain Type  Acute pain;Chronic pain    Pain Onset  1 to 4 weeks ago    Pain Frequency  Intermittent    Aggravating Factors   moving the wrong way    Pain Relieving Factors  heat; icy hot; TENS          [facility] PT Assessment - 06/30/17 0001      Assessment   Medical Diagnosis  Rt  lumbar pain     Referring Provider  Dr Lynne Leader     Onset Date/Surgical Date  05/31/17    Hand Dominance  Right    Next MD Visit  PRN     Prior Therapy  yes for hip and back pain       Palpation   Palpation comment  muscular tightness thoracolumbar musculature; QL; piriformis; hip flexors Rt > Lt                    [facility] Adult PT Treatment/Exercise - 06/30/17 0001      Lumbar Exercises: Stretches   Double Knee to Chest Stretch  3 reps;30 seconds    Piriformis Stretch  Right;3 reps;30 seconds supine travell    Other Lumbar Stretch Exercise  sitting with lateral trunk flexion over bolster for 3 reps of 30 sec hold       Moist Heat Therapy   Number Minutes Moist Heat  20 Minutes    Moist Heat Location  Lumbar Spine      Electrical Stimulation  Electrical Stimulation Location  Rt lumbar/SI area     Chartered certified accountant  IFC    Electrical Stimulation Parameters  to tolerance    Electrical Stimulation Goals  Pain;Tone      Manual Therapy   Manual therapy comments  pt prone     Soft tissue mobilization  soft tissue work Rt lumbar/QL/SI area     Myofascial Release  Rt lumbar/sacral area        Trigger Point Dry Needling - 06/30/17 1246    Consent Given?  Yes    Muscles Treated Lower Body  -- Rt lumbar with estim     Longissimus Response  Palpable increased muscle length Rt lumbar/QL area                 PT Long Term Goals - 06/30/17 1250      PT LONG TERM GOAL #1   Title  independent with HEP (07/29/17)    Time  6    Period  Weeks    Status  On-going      PT LONG TERM GOAL #2   Title  improve lumbar spine mobility by 10-20% throughout  (07/29/17)    Time  6    Period  Weeks    Status  On-going      PT LONG TERM GOAL #3   Title  report ability to walk > 15 min without increase in pain for improved activity tolerance and decreased pain (07/29/17)    Time  6    Period  Weeks    Status  On-going      PT LONG TERM GOAL #4   Title  improve  FOTO to </= 42% limitation (07/29/17)    Time  6    Period  Weeks    Status  On-going      PT LONG TERM GOAL #5   Title  decrease pain in Rt hip by 50-75% allowing patient to return to normal functional activitiy level (07/29/17)    Time  6    Period  Weeks    Status  On-going            Plan - 06/30/17 1247    Clinical Impression Statement  Back pain continues to improve. She has less pain and tightness through the Rt lumbar area but does notice some deep tightness at the Rt waist area. Patient responded well to the DN and manual work in this area - deep tightness in the Rt QL and lumbar paraspinals.     Rehab Potential  Good    PT Frequency  2x / week    PT Duration  6 weeks    PT Treatment/Interventions  Patient/family education;ADLs/Self Care Home Management;Cryotherapy;Electrical Stimulation;Iontophoresis 4mg /ml Dexamethasone;Ultrasound;Dry needling;Manual techniques;Neuromuscular re-education;Therapeutic activities;Therapeutic exercise    PT Next Visit Plan  continue DN for lumbar spine; manual work; ther exercise; modalities as indicated     Consulted and Agree with Plan of Care  Patient       Patient will benefit from skilled therapeutic intervention in order to improve the following deficits and impairments:  Postural dysfunction, Improper body mechanics, Pain, Increased fascial restricitons, Increased muscle spasms, Hypomobility, Decreased mobility, Decreased range of motion, Decreased activity tolerance  Visit Diagnosis: Acute right-sided low back pain without sciatica  Other symptoms and signs involving the musculoskeletal system  Weakness generalized     Problem List Patient Active Problem List   Diagnosis Date Noted  . Pneumothorax 06/25/2017  . Atypical chest pain 05/24/2017  .  Itchy scalp 05/24/2017  . Right leg pain 02/06/2017  . Chronic tension-type headache, intractable 02/05/2017  . Prediabetes 02/05/2017  . Lump in throat 01/27/2017  . Dysphagia  01/27/2017  . No energy 09/15/2016  . Bad taste in mouth 07/26/2016  . Paresthesia 04/03/2016  . Facet hypertrophy of lumbosacral region 04/03/2016  . Chronic back pain 04/03/2016  . Right knee pain 02/19/2016  . Rosacea 02/04/2016  . Vaginal atrophy 01/31/2016  . Idiopathic scoliosis 01/22/2016  . Hip bursitis 12/25/2015  . Biceps tendonitis on right 12/25/2015  . Diverticulitis of colon 12/21/2015  . GERD (gastroesophageal reflux disease) 11/27/2015  . Osteoporosis 11/27/2015  . GAD (generalized anxiety disorder) 11/27/2015  . MDD (major depressive disorder), recurrent, in full remission ([place]) 11/27/2015  . Chronic dryness of both eyes 11/27/2015  . Hypothyroidism 11/27/2015  . Asthma, chronic 11/27/2015  . Insomnia 11/27/2015  . Seborrheic keratoses 11/27/2015    Celyn Nilda Simmer PT, MPH  06/30/2017, 12:52 PM  [facility] [place], [place], [address] Phone: [phone]   Fax:  [phone]  Name: Leslie Roberson MRN: [id number] Date of Birth: May 16, 1955

## 2017-06-30 NOTE — Telephone Encounter (Signed)
Spoke with pt, Follow up scheduled with APP.

## 2017-07-02 ENCOUNTER — Encounter: Payer: Self-pay | Admitting: Rehabilitative and Restorative Service Providers"

## 2017-07-02 ENCOUNTER — Ambulatory Visit (INDEPENDENT_AMBULATORY_CARE_PROVIDER_SITE_OTHER): Payer: BLUE CROSS/BLUE SHIELD | Admitting: Physician Assistant

## 2017-07-02 ENCOUNTER — Ambulatory Visit: Payer: BLUE CROSS/BLUE SHIELD | Admitting: Rehabilitative and Restorative Service Providers"

## 2017-07-02 VITALS — BP 100/58 | HR 92 | Ht <= 58 in | Wt 124.0 lb

## 2017-07-02 DIAGNOSIS — M545 Low back pain, unspecified: Secondary | ICD-10-CM

## 2017-07-02 DIAGNOSIS — R531 Weakness: Secondary | ICD-10-CM

## 2017-07-02 DIAGNOSIS — J939 Pneumothorax, unspecified: Secondary | ICD-10-CM | POA: Diagnosis not present

## 2017-07-02 DIAGNOSIS — I208 Other forms of angina pectoris: Secondary | ICD-10-CM | POA: Diagnosis not present

## 2017-07-02 DIAGNOSIS — R29898 Other symptoms and signs involving the musculoskeletal system: Secondary | ICD-10-CM | POA: Diagnosis not present

## 2017-07-02 NOTE — Therapy (Signed)
[facility] [place], [place], [address] Phone: [phone]   Fax:  [phone]  Physical Therapy Treatment  Patient Details  Name: Leslie Roberson MRN: [id number] Date of Birth: Aug 15, 1955 Referring Provider: Dr Lynne Leader    Encounter Date: 07/02/2017  PT End of Session - 07/02/17 1105    Visit Number  5    Number of Visits  12    Date for PT Re-Evaluation  07/29/17    PT Start Time  1102    PT Stop Time  1200    PT Time Calculation (min)  58 min    Activity Tolerance  Patient tolerated treatment well       Past Medical History:  Diagnosis Date  . Anxiety   . Asthma, chronic 11/27/2015  . Depression   . Diverticulitis of colon 12/21/2015   Colonoscopy every 5 years/digestive health.   . GERD (gastroesophageal reflux disease) 11/27/2015  . Idiopathic scoliosis 01/22/2016  . Osteoporosis 11/27/2015   On prolia.   . Pneumothorax 06/26/2017  . Pre-diabetes   . Thyroid disease     Past Surgical History:  Procedure Laterality Date  . ABDOMINAL HYSTERECTOMY    . bladder mesh    . ELBOW SURGERY Right   . SPINAL FUSION    . TONSILLECTOMY    . TOTAL HIP ARTHROPLASTY      There were no vitals filed for this visit.  Subjective Assessment - 07/02/17 1106    Subjective  Patient saw her massage therapist this week. She worked on the shoulder, back and ip. Leslie Roberson reports that Rt upper trap is really tight today. Having difficulty reaching up into her cabinets in the kitchen has irritated the Rt shoulder.     Currently in Pain?  Yes    Pain Score  5     Pain Location  Back    Pain Orientation  Right;Lower    Pain Type  Acute pain;Chronic pain    Pain Onset  1 to 4 weeks ago    Pain Frequency  Intermittent                       [facility] Adult PT Treatment/Exercise - 07/02/17 0001      Lumbar Exercises: Stretches   Piriformis Stretch  Right;3 reps;30 seconds supine travell    Other  Lumbar Stretch Exercise  sitting with lateral trunk flexion over bolster for 3 reps of 30 sec hold     Other Lumbar Stretch Exercise  pec stretch 3 positions 30 sec x 2 reps each       Moist Heat Therapy   Number Minutes Moist Heat  20 Minutes    Moist Heat Location  Lumbar Spine      Electrical Stimulation   Electrical Stimulation Location  Rt lumbar/SI area to Rt upper trap/scapular area      Electrical Stimulation Action  IFC    Electrical Stimulation Parameters  to tolerance    Electrical Stimulation Goals  Pain;Tone      Ultrasound   Ultrasound Location  Rt lumbar/QL area; Rt upper trap/leveator    Ultrasound Parameters  1.5 w/cm2; 1 mHz; 100%; 10 min     Ultrasound Goals  Pain;Other (Comment) tightness       Manual Therapy   Manual therapy comments  pt prone     Soft tissue mobilization  soft tissue work Rt lumbar/QL/SI area into the Rt scapular/upper trap area  Myofascial Release  Rt lumbar/sacral area              PT Education - 07/02/17 1123    Education provided  Yes    Education Details  HEP    Person(s) Educated  Patient    Methods  Explanation;Demonstration;Tactile cues;Verbal cues;Handout    Comprehension  Verbalized understanding;Returned demonstration;Verbal cues required;Tactile cues required          PT Long Term Goals - 06/30/17 1250      PT LONG TERM GOAL #1   Title  independent with HEP (07/29/17)    Time  6    Period  Weeks    Status  On-going      PT LONG TERM GOAL #2   Title  improve lumbar spine mobility by 10-20% throughout  (07/29/17)    Time  6    Period  Weeks    Status  On-going      PT LONG TERM GOAL #3   Title  report ability to walk > 15 min without increase in pain for improved activity tolerance and decreased pain (07/29/17)    Time  6    Period  Weeks    Status  On-going      PT LONG TERM GOAL #4   Title  improve FOTO to </= 42% limitation (07/29/17)    Time  6    Period  Weeks    Status  On-going      PT LONG TERM  GOAL #5   Title  decrease pain in Rt hip by 50-75% allowing patient to return to normal functional activitiy level (07/29/17)    Time  6    Period  Weeks    Status  On-going            Plan - 07/02/17 1148    Clinical Impression Statement  Continued tightness and pain in the Rt lumbar and thoracic musculature. She does respond well to manual work and modalities with decrease in pain reported and improvement in tissue extensibility noted with palpation.     Rehab Potential  Good    PT Frequency  2x / week    PT Duration  6 weeks    PT Treatment/Interventions  Patient/family education;ADLs/Self Care Home Management;Cryotherapy;Electrical Stimulation;Iontophoresis 4mg /ml Dexamethasone;Ultrasound;Dry needling;Manual techniques;Neuromuscular re-education;Therapeutic activities;Therapeutic exercise    PT Next Visit Plan  continue DN for lumbar spine; manual work; ther exercise; assess response to [place]; modalities as indicated     Consulted and Agree with Plan of Care  Patient       Patient will benefit from skilled therapeutic intervention in order to improve the following deficits and impairments:  Postural dysfunction, Improper body mechanics, Pain, Increased fascial restricitons, Increased muscle spasms, Hypomobility, Decreased mobility, Decreased range of motion, Decreased activity tolerance  Visit Diagnosis: Acute right-sided low back pain without sciatica  Other symptoms and signs involving the musculoskeletal system  Weakness generalized     Problem List Patient Active Problem List   Diagnosis Date Noted  . Pneumothorax 06/25/2017  . Atypical chest pain 05/24/2017  . Itchy scalp 05/24/2017  . Right leg pain 02/06/2017  . Chronic tension-type headache, intractable 02/05/2017  . Prediabetes 02/05/2017  . Lump in throat 01/27/2017  . Dysphagia 01/27/2017  . No energy 09/15/2016  . Bad taste in mouth 07/26/2016  . Paresthesia 04/03/2016  . Facet hypertrophy of lumbosacral  region 04/03/2016  . Chronic back pain 04/03/2016  . Right knee pain 02/19/2016  . Rosacea 02/04/2016  .  Vaginal atrophy 01/31/2016  . Idiopathic scoliosis 01/22/2016  . Hip bursitis 12/25/2015  . Biceps tendonitis on right 12/25/2015  . Diverticulitis of colon 12/21/2015  . GERD (gastroesophageal reflux disease) 11/27/2015  . Osteoporosis 11/27/2015  . GAD (generalized anxiety disorder) 11/27/2015  . MDD (major depressive disorder), recurrent, in full remission ([place]) 11/27/2015  . Chronic dryness of both eyes 11/27/2015  . Hypothyroidism 11/27/2015  . Asthma, chronic 11/27/2015  . Insomnia 11/27/2015  . Seborrheic keratoses 11/27/2015    Leslie Roberson PT, MPH  07/02/2017, 1:03 PM  [facility] [place], [place], [address] Phone: [phone]   Fax:  [phone]  Name: Leslie Roberson MRN: [id number] Date of Birth: 1955/11/14

## 2017-07-02 NOTE — Patient Instructions (Addendum)
No medication changes   SCHEDULE AT CARDIAC CATH AT [facility]  July 07 2017 Your physician has requested that you have a cardiac catheterization. Cardiac catheterization is used to diagnose and/or treat various heart conditions. Doctors may recommend this procedure for a number of different reasons. The most common reason is to evaluate chest pain. Chest pain can be a symptom of coronary artery disease (CAD), and cardiac catheterization can show whether plaque is narrowing or blocking your heart's arteries. This procedure is also used to evaluate the valves, as well as measure the blood flow and oxygen levels in different parts of your heart. For further information please visit [email or web address]. Please follow instruction sheet, as given.   Keep appointment with Dr Stanford Breed - MAY 29 ,2019       [place] [address] [place] [address] Dept: [phone] Loc: [place]  07/02/2017  You are scheduled for a Cardiac Catheterization on Tuesday, April 9 with Dr. Glenetta Hew.  1. Please arrive at the [facility] (Main Entrance A) at [facility]: [address] [place], [place] [address] at 5:30 AM (two hours before your procedure to ensure your preparation). Free valet parking service is available.   Special note: Every effort is made to have your procedure done on time. Please understand that emergencies sometimes delay scheduled procedures.  2. Diet: Do not eat or drink anything after midnight prior to your procedure except sips of water to take medications.  3. Labs: NO LABS NEEDED , LAST SET WAS ON 06/26/17  4. Medication instructions in preparation for your procedure:    On the morning of your procedure, take your Aspirin 81 MG and any morning medicines NOT listed above.  You may use sips of water.  5. Plan for one night stay--bring personal  belongings. 6. Bring a current list of your medications and current insurance cards. 7. You MUST have a responsible person to drive you home. 8. Someone MUST be with you the first 24 hours after you arrive home or your discharge will be delayed. 9. Please wear clothes that are easy to get on and off and wear slip-on shoes.  Thank you for allowing [place] to care for you!   -- [facility]

## 2017-07-02 NOTE — Progress Notes (Signed)
Cardiology Office Note   Date:  07/02/2017   ID:  Dempsey Knotek Rossitto, DOB 1955/10/17, MRN [id number]  PCP:  Donella Stade, PA-C  Cardiologist: Dr. Stanford Breed, 06/24/2017 Rosaria Ferries, PA-C   No chief complaint on file.   History of Present Illness: Leslie Roberson is a 62 y.o. female with a history of asthma, GERD, pre-DM, hypothyroid, CP w/ poor exercise tolerance on ETT  3/27 office visit, symptoms concerning for angina, cardiac cath recommended, ASA 81 mg added Admitted 03/28-30/30/2019 w/ shortness of breath and CP which started at rest the day before her heart cath, PTX on CXR, improved from 20% to 15%, no chest tube needed  [place] presents for cardiology follow up.  Dr Servando Snare felt the punctured lung happed the day she was admitted, did not cause any of her earlier symptoms.   She has been taking it easy since d/c, but did have some CP w/ activity yesterday. It was the same chest tightness she has been having, resolved with rest.  She had some mild shortness of breath with it, but no nausea, vomiting, or diaphoresis.  She has not noticed any new DOE, but has not been active. No orthopnea or PND. No LE edema.   Past Medical History:  Diagnosis Date  . Anxiety   . Asthma, chronic 11/27/2015  . Depression   . Diverticulitis of colon 12/21/2015   Colonoscopy every 5 years/digestive health.   . GERD (gastroesophageal reflux disease) 11/27/2015  . Idiopathic scoliosis 01/22/2016  . Osteoporosis 11/27/2015   On prolia.   . Pneumothorax 06/26/2017  . Pre-diabetes   . Thyroid disease     Past Surgical History:  Procedure Laterality Date  . ABDOMINAL HYSTERECTOMY    . bladder mesh    . ELBOW SURGERY Right   . SPINAL FUSION    . TONSILLECTOMY    . TOTAL HIP ARTHROPLASTY      Current Outpatient Medications  Medication Sig Dispense Refill  . albuterol (PROVENTIL HFA;VENTOLIN HFA) 108 (90 Base) MCG/ACT inhaler Inhale 1-2 puffs into the lungs every 4  (four) hours as needed for wheezing or shortness of breath. 1 Inhaler 3  . aspirin EC 81 MG tablet Take 81 mg by mouth at bedtime.    . busPIRone (BUSPAR) 30 MG tablet TAKE 1 TABLET BY MOUTH TWICE A DAY (Patient taking differently: Take 30 mg by mouth two times a day) 60 tablet 3  . calcium-vitamin D (OSCAL WITH D) 500-200 MG-UNIT tablet Take 1 tablet by mouth daily.    . clobetasol (TEMOVATE) 0.05 % external solution Apply 1 application topically 2 (two) times daily. (Patient taking differently: Apply 1 application topically 2 (two) times daily as needed (for itching). ) 50 mL 1  . clonazePAM (KLONOPIN) 0.5 MG tablet Take 1 tablet (0.5 mg total) by mouth daily as needed for anxiety. (Patient taking differently: Take 0.5 mg by mouth at bedtime. ) 30 tablet 1  . denosumab (PROLIA) 60 MG/ML SOLN injection Inject 60 mg into the skin every 6 (six) months. Administer in upper arm, thigh, or abdomen    . desvenlafaxine (PRISTIQ) 100 MG 24 hr tablet TAKE 1 TABLET BY MOUTH EVERY DAY (Patient taking differently: Take 100 mg by mouth at bedtime) 30 tablet 4  . desvenlafaxine (PRISTIQ) 50 MG 24 hr tablet Take 50 mg by mouth at bedtime 90 tablet 1  . dexlansoprazole (DEXILANT) 60 MG capsule TAKE 1 CAPSULE BY MOUTH EVERY DAY (Patient taking differently: Take 60  mg by mouth daily. ) 90 capsule 3  . diclofenac sodium (VOLTAREN) 1 % GEL APPLY 4G TOPICALLY 4 TIMES DAILY (Patient taking differently: Apply 4 g topically 4 (four) times daily as needed (to affected painful sites). ) 100 g 12  . escitalopram (LEXAPRO) 10 MG tablet Take 20 mg by mouth at bedtime.     . flunisolide (NASAREL) 29 MCG/ACT (0.025%) nasal spray Place 2 sprays into the nose daily as needed for rhinitis.     Marland Kitchen gabapentin (NEURONTIN) 300 MG capsule TAKE 1 CAP AT BEDTIME X1 WEEK,1 CAP TWICE DAILY X1 WEEK THEN 1 CAP 3X DAILY MAY DOUBLE WEEKLY=3600MG  (Patient taking differently: Take 300 mg by mouth in the morning and 600 mg at bedtime) 180 capsule 3  .  HYDROcodone-acetaminophen (NORCO/VICODIN) 5-325 MG tablet Take 1 tablet by mouth every 8 (eight) hours as needed for moderate pain. After toenail removal. (Patient not taking: Reported on 06/25/2017) 10 tablet 0  . hydrOXYzine (ATARAX/VISTARIL) 25 MG tablet Take 1 tablet (25 mg total) by mouth 3 (three) times daily as needed. For chest tightness and/or scalp itching (Patient taking differently: Take 25 mg by mouth at bedtime. ) 30 tablet 1  . ipratropium (ATROVENT) 0.06 % nasal spray Place 2 sprays into both nostrils 4 (four) times daily. (Patient not taking: Reported on 06/25/2017) 15 mL 1  . levothyroxine (SYNTHROID, LEVOTHROID) 125 MCG tablet TAKE ONE-HALF TABLET BY MOUTH ONCE DAILY (Patient taking differently: Take 62.5 mcg by mouth once a day) 30 tablet 1  . lidocaine (LIDODERM) 5 % Place 1 patch onto the skin daily. Remove & Discard patch within 12 hours or as directed by MD (Patient taking differently: Place 1 patch onto the skin daily as needed (for pain). Remove & Discard patch within 12 hours or as directed by MD) 30 patch 12  . meloxicam (MOBIC) 15 MG tablet Take 15 mg by mouth daily.     . Menthol, Topical Analgesic, (MINERAL ICE EX) Apply 1 application to affected sites one to two times a day as needed (for pain)    . naproxen sodium (ANAPROX) 220 MG tablet Take 220 mg by mouth as needed (pain).     . nortriptyline (PAMELOR) 25 MG capsule TAKE 1 CAPSULE (25 MG TOTAL) BY MOUTH AT BEDTIME (Patient not taking: Reported on 06/25/2017) 30 capsule 1  . Omega-3 Fatty Acids (FISH OIL PO) Take 1 capsule by mouth daily.     . pantoprazole (PROTONIX) 40 MG tablet Take 1 tablet (40 mg total) by mouth daily. (Patient taking differently: Take 40 mg by mouth at bedtime. ) 90 tablet 1  . traZODone (DESYREL) 50 MG tablet TAKE 1 TABLET BY MOUTH EVERYDAY AT BEDTIME 90 tablet 1  . triamcinolone ointment (KENALOG) 0.1 % Apply 1 application topically 2 (two) times daily. To affected area(s) as needed. Use no longer  than 1-2 weeks. (Patient not taking: Reported on 06/25/2017) 30 g 0   No current facility-administered medications for this visit.     Allergies:   Hydromorphone; Levofloxacin; Meperidine; Broccoli [brassica oleracea italica]; and Adhesive [tape]    Social History:  The patient  reports that she has never smoked. She has never used smokeless tobacco. She reports that she drinks alcohol. She reports that she does not use drugs.   Family History:  The patient's family history includes COPD in her paternal aunt; Cancer in her mother; Depression in her mother; Diabetes in her maternal aunt; Heart attack in her father; Hyperlipidemia in her father  and paternal aunt; Hypertension in her father.    ROS:  Please see the history of present illness. All other systems are reviewed and negative.    PHYSICAL EXAM: VS:  BP (!) 100/58 (BP Location: Left Arm, Patient Position: Sitting, Cuff Size: Normal)   Pulse 92   Ht 4\' 10"  (1.473 m)   Wt 124 lb (56.2 kg)   SpO2 96%   BMI 25.92 kg/m  , BMI Body mass index is 25.92 kg/m. GEN: Well nourished, well developed, female in no acute distress  HEENT: normal for age  Neck: no JVD, no carotid bruit, no masses Cardiac: RRR; no murmur, no rubs, or gallops Respiratory:  clear to auscultation bilaterally, normal work of breathing GI: soft, nontender, nondistended, + BS MS: no deformity or atrophy; no edema; distal pulses are 2+ in all 4 extremities   Skin: warm and dry, no rash Neuro:  Strength and sensation are intact Psych: euthymic mood, full affect   EKG:  EKG is not ordered today. ECG from 06/25/2017 is reviewed and shows sinus rhythm, heart rate 93, nonspecific ST/T wave changes   Recent Labs: 02/03/2017: ALT 38; TSH 1.67 06/26/2017: BUN 14; Creatinine, Ser 0.84; Hemoglobin 13.8; Platelets 199; Potassium 4.2; Sodium 137    Lipid Panel    Component Value Date/Time   CHOL 185 02/03/2017 1017   TRIG 90 02/03/2017 1017   HDL 60 02/03/2017 1017     CHOLHDL 3.1 02/03/2017 1017   VLDL 29 12/28/2015 1056   LDLCALC 107 (H) 02/03/2017 1017     Wt Readings from Last 3 Encounters:  07/02/17 124 lb (56.2 kg)  06/27/17 133 lb 6.1 oz (60.5 kg)  06/24/17 126 lb 6.4 oz (57.3 kg)     Other studies Reviewed: Additional studies/ records that were reviewed today include: Office notes, hospital records and testing.  ASSESSMENT AND PLAN:  1.  Exertional angina: Her symptoms are relieved by rest in just a few minutes so I will not add sublingual nitroglycerin at this time. -She is not having resting symptoms so does not need admission. -Dr. Jacalyn Lefevre original plan was to get a cardiac catheterization. -Dr. Servando Snare has cleared her for the procedure -We will schedule a cardiac catheterization for next week -Her labs in the system show no significant abnormalities and are within 2 weeks, do not need to be redrawn. -Follow-up with Dr. Stanford Breed after the procedure  2.  Pneumothorax: Patient states that Dr. Servando Snare told her she can go ahead and have the heart catheterization.  She has an appointment with him on Monday and is encouraged to keep it.  Current medicines are reviewed at length with the patient today.  The patient does not have concerns regarding medicines.  The following changes have been made:  no change  Labs/ tests ordered today include:   Orders Placed This Encounter  Procedures  . LEFT HEART CATHETERIZATION WITH CORONARY ANGIOGRAM     Disposition:   FU with Dr. Stanford Breed  Signed, Rosaria Ferries, PA-C  07/02/2017 2:11 PM    [place] Phone: [phone]; Fax: [phone]  This note was written with the assistance of speech recognition software. Please excuse any transcriptional errors.

## 2017-07-02 NOTE — Patient Instructions (Signed)
Scapula Adduction With Pectoralis Stretch: Low - Standing   Shoulders at 45 hands even with shoulders, keeping weight through legs, shift weight forward until you feel pull or stretch through the front of your chest. Hold _30__ seconds. Do _3__ times, _2-4__ times per day.   Scapula Adduction With Pectoralis Stretch: Mid-Range - Standing   Shoulders at 90 elbows even with shoulders, keeping weight through legs, shift weight forward until you feel pull or strength through the front of your chest. Hold __30_ seconds. Do _3__ times, __2-4_ times per day.   Scapula Adduction With Pectoralis Stretch: High - Standing   Shoulders at 120 hands up high on the doorway, keeping weight on feet, shift weight forward until you feel pull or stretch through the front of your chest. Hold _30__ seconds. Do _3__ times, _2-3__ times per day.  

## 2017-07-02 NOTE — H&P (Signed)
Cardiology Office Note   Date:  07/02/2017   ID:  Leslie Roberson, DOB 03-Jul-1955, MRN [id number]  PCP:  Donella Stade, PA-C           Cardiologist: Dr. Stanford Breed, 06/24/2017 Rosaria Ferries, PA-C   No chief complaint on file.   History of Present Illness: Leslie Roberson is a 62 y.o. female with a history of asthma, GERD, pre-DM, hypothyroid, CP w/ poor exercise tolerance on ETT  3/27 office visit, symptoms concerning for angina, cardiac cath recommended, ASA 81 mg added Admitted 03/28-30/30/2019 w/ shortness of breath and CP which started at rest the day before her heart cath, PTX on CXR, improved from 20% to 15%, no chest tube needed  [place] presents for cardiology follow up.  Dr Servando Snare felt the punctured lung happed the day she was admitted, did not cause any of her earlier symptoms.   She has been taking it easy since d/c, but did have some CP w/ activity yesterday. It was the same chest tightness she has been having, resolved with rest.  She had some mild shortness of breath with it, but no nausea, vomiting, or diaphoresis.  She has not noticed any new DOE, but has not been active. No orthopnea or PND. No LE edema.       Past Medical History:  Diagnosis Date  . Anxiety   . Asthma, chronic 11/27/2015  . Depression   . Diverticulitis of colon 12/21/2015   Colonoscopy every 5 years/digestive health.   . GERD (gastroesophageal reflux disease) 11/27/2015  . Idiopathic scoliosis 01/22/2016  . Osteoporosis 11/27/2015   On prolia.   . Pneumothorax 06/26/2017  . Pre-diabetes   . Thyroid disease     Past Surgical History:  Procedure Laterality Date  . ABDOMINAL HYSTERECTOMY    . bladder mesh    . ELBOW SURGERY Right   . SPINAL FUSION    . TONSILLECTOMY    . TOTAL HIP ARTHROPLASTY            Current Outpatient Medications  Medication Sig Dispense Refill  . albuterol (PROVENTIL HFA;VENTOLIN HFA) 108 (90 Base)  MCG/ACT inhaler Inhale 1-2 puffs into the lungs every 4 (four) hours as needed for wheezing or shortness of breath. 1 Inhaler 3  . aspirin EC 81 MG tablet Take 81 mg by mouth at bedtime.    . busPIRone (BUSPAR) 30 MG tablet TAKE 1 TABLET BY MOUTH TWICE A DAY (Patient taking differently: Take 30 mg by mouth two times a day) 60 tablet 3  . calcium-vitamin D (OSCAL WITH D) 500-200 MG-UNIT tablet Take 1 tablet by mouth daily.    . clobetasol (TEMOVATE) 0.05 % external solution Apply 1 application topically 2 (two) times daily. (Patient taking differently: Apply 1 application topically 2 (two) times daily as needed (for itching). ) 50 mL 1  . clonazePAM (KLONOPIN) 0.5 MG tablet Take 1 tablet (0.5 mg total) by mouth daily as needed for anxiety. (Patient taking differently: Take 0.5 mg by mouth at bedtime. ) 30 tablet 1  . denosumab (PROLIA) 60 MG/ML SOLN injection Inject 60 mg into the skin every 6 (six) months. Administer in upper arm, thigh, or abdomen    . desvenlafaxine (PRISTIQ) 100 MG 24 hr tablet TAKE 1 TABLET BY MOUTH EVERY DAY (Patient taking differently: Take 100 mg by mouth at bedtime) 30 tablet 4  . desvenlafaxine (PRISTIQ) 50 MG 24 hr tablet Take 50 mg by mouth at bedtime 90 tablet 1  .  dexlansoprazole (DEXILANT) 60 MG capsule TAKE 1 CAPSULE BY MOUTH EVERY DAY (Patient taking differently: Take 60 mg by mouth daily. ) 90 capsule 3  . diclofenac sodium (VOLTAREN) 1 % GEL APPLY 4G TOPICALLY 4 TIMES DAILY (Patient taking differently: Apply 4 g topically 4 (four) times daily as needed (to affected painful sites). ) 100 g 12  . escitalopram (LEXAPRO) 10 MG tablet Take 20 mg by mouth at bedtime.     . flunisolide (NASAREL) 29 MCG/ACT (0.025%) nasal spray Place 2 sprays into the nose daily as needed for rhinitis.     Marland Kitchen gabapentin (NEURONTIN) 300 MG capsule TAKE 1 CAP AT BEDTIME X1 WEEK,1 CAP TWICE DAILY X1 WEEK THEN 1 CAP 3X DAILY MAY DOUBLE WEEKLY=3600MG  (Patient taking differently: Take 300  mg by mouth in the morning and 600 mg at bedtime) 180 capsule 3  . HYDROcodone-acetaminophen (NORCO/VICODIN) 5-325 MG tablet Take 1 tablet by mouth every 8 (eight) hours as needed for moderate pain. After toenail removal. (Patient not taking: Reported on 06/25/2017) 10 tablet 0  . hydrOXYzine (ATARAX/VISTARIL) 25 MG tablet Take 1 tablet (25 mg total) by mouth 3 (three) times daily as needed. For chest tightness and/or scalp itching (Patient taking differently: Take 25 mg by mouth at bedtime. ) 30 tablet 1  . ipratropium (ATROVENT) 0.06 % nasal spray Place 2 sprays into both nostrils 4 (four) times daily. (Patient not taking: Reported on 06/25/2017) 15 mL 1  . levothyroxine (SYNTHROID, LEVOTHROID) 125 MCG tablet TAKE ONE-HALF TABLET BY MOUTH ONCE DAILY (Patient taking differently: Take 62.5 mcg by mouth once a day) 30 tablet 1  . lidocaine (LIDODERM) 5 % Place 1 patch onto the skin daily. Remove & Discard patch within 12 hours or as directed by MD (Patient taking differently: Place 1 patch onto the skin daily as needed (for pain). Remove & Discard patch within 12 hours or as directed by MD) 30 patch 12  . meloxicam (MOBIC) 15 MG tablet Take 15 mg by mouth daily.     . Menthol, Topical Analgesic, (MINERAL ICE EX) Apply 1 application to affected sites one to two times a day as needed (for pain)    . naproxen sodium (ANAPROX) 220 MG tablet Take 220 mg by mouth as needed (pain).     . nortriptyline (PAMELOR) 25 MG capsule TAKE 1 CAPSULE (25 MG TOTAL) BY MOUTH AT BEDTIME (Patient not taking: Reported on 06/25/2017) 30 capsule 1  . Omega-3 Fatty Acids (FISH OIL PO) Take 1 capsule by mouth daily.     . pantoprazole (PROTONIX) 40 MG tablet Take 1 tablet (40 mg total) by mouth daily. (Patient taking differently: Take 40 mg by mouth at bedtime. ) 90 tablet 1  . traZODone (DESYREL) 50 MG tablet TAKE 1 TABLET BY MOUTH EVERYDAY AT BEDTIME 90 tablet 1  . triamcinolone ointment (KENALOG) 0.1 % Apply 1 application  topically 2 (two) times daily. To affected area(s) as needed. Use no longer than 1-2 weeks. (Patient not taking: Reported on 06/25/2017) 30 g 0   No current facility-administered medications for this visit.     Allergies:   Hydromorphone; Levofloxacin; Meperidine; Broccoli [brassica oleracea italica]; and Adhesive [tape]    Social History:  The patient  reports that she has never smoked. She has never used smokeless tobacco. She reports that she drinks alcohol. She reports that she does not use drugs.   Family History:  The patient's family history includes COPD in her paternal aunt; Cancer in her mother; Depression  in her mother; Diabetes in her maternal aunt; Heart attack in her father; Hyperlipidemia in her father and paternal aunt; Hypertension in her father.    ROS:  Please see the history of present illness. All other systems are reviewed and negative.    PHYSICAL EXAM: VS:  BP (!) 100/58 (BP Location: Left Arm, Patient Position: Sitting, Cuff Size: Normal)   Pulse 92   Ht 4\' 10"  (1.473 m)   Wt 124 lb (56.2 kg)   SpO2 96%   BMI 25.92 kg/m  , BMI Body mass index is 25.92 kg/m. GEN: Well nourished, well developed, female in no acute distress  HEENT: normal for age  Neck: no JVD, no carotid bruit, no masses Cardiac: RRR; no murmur, no rubs, or gallops Respiratory:  clear to auscultation bilaterally, normal work of breathing GI: soft, nontender, nondistended, + BS MS: no deformity or atrophy; no edema; distal pulses are 2+ in all 4 extremities   Skin: warm and dry, no rash Neuro:  Strength and sensation are intact Psych: euthymic mood, full affect   EKG:  EKG is not ordered today. ECG from 06/25/2017 is reviewed and shows sinus rhythm, heart rate 93, nonspecific ST/T wave changes   Recent Labs: 02/03/2017: ALT 38; TSH 1.67 06/26/2017: BUN 14; Creatinine, Ser 0.84; Hemoglobin 13.8; Platelets 199; Potassium 4.2; Sodium 137    Lipid Panel Labs(Brief)            Component Value Date/Time   CHOL 185 02/03/2017 1017   TRIG 90 02/03/2017 1017   HDL 60 02/03/2017 1017   CHOLHDL 3.1 02/03/2017 1017   VLDL 29 12/28/2015 1056   LDLCALC 107 (H) 02/03/2017 1017          Wt Readings from Last 3 Encounters:  07/02/17 124 lb (56.2 kg)  06/27/17 133 lb 6.1 oz (60.5 kg)  06/24/17 126 lb 6.4 oz (57.3 kg)     Other studies Reviewed: Additional studies/ records that were reviewed today include: Office notes, hospital records and testing.  ASSESSMENT AND PLAN:  1.  Exertional angina: Her symptoms are relieved by rest in just a few minutes so I will not add sublingual nitroglycerin at this time. -She is not having resting symptoms so does not need admission. -Dr. Jacalyn Lefevre original plan was to get a cardiac catheterization. -Dr. Servando Snare has cleared her for the procedure -We will schedule a cardiac catheterization for next week -Her labs in the system show no significant abnormalities and are within 2 weeks, do not need to be redrawn. -Follow-up with Dr. Stanford Breed after the procedure  2.  Pneumothorax: Patient states that Dr. Servando Snare told her she can go ahead and have the heart catheterization.  She has an appointment with him on Monday and is encouraged to keep it.  Current medicines are reviewed at length with the patient today.  The patient does not have concerns regarding medicines.  The following changes have been made:  no change  Labs/ tests ordered today include:      Orders Placed This Encounter  Procedures  . LEFT HEART CATHETERIZATION WITH CORONARY ANGIOGRAM     Disposition:   FU with Dr. Stanford Breed  Signed, Rosaria Ferries, PA-C  07/02/2017 2:11 PM    [place] Phone: [phone]; Fax: [phone]  This note was written with the assistance of speech recognition software. Please excuse any transcriptional errors.

## 2017-07-03 ENCOUNTER — Other Ambulatory Visit: Payer: Self-pay | Admitting: Cardiothoracic Surgery

## 2017-07-03 ENCOUNTER — Other Ambulatory Visit: Payer: Self-pay

## 2017-07-03 ENCOUNTER — Other Ambulatory Visit (HOSPITAL_COMMUNITY): Payer: Self-pay

## 2017-07-03 DIAGNOSIS — J939 Pneumothorax, unspecified: Secondary | ICD-10-CM

## 2017-07-03 MED ORDER — ESCITALOPRAM OXALATE 10 MG PO TABS: 20.0000 mg | ORAL_TABLET | Freq: Every day | ORAL | 0 refills | Status: DC

## 2017-07-03 NOTE — Progress Notes (Signed)
Phamacy sent over fax requesting refill on Lexapro. Sent over a one month supply of Lexapro 10 mg.

## 2017-07-06 ENCOUNTER — Encounter: Payer: Self-pay | Admitting: Rehabilitative and Restorative Service Providers"

## 2017-07-06 ENCOUNTER — Ambulatory Visit: Payer: BLUE CROSS/BLUE SHIELD | Admitting: Rehabilitative and Restorative Service Providers"

## 2017-07-06 ENCOUNTER — Telehealth: Payer: Self-pay | Admitting: *Deleted

## 2017-07-06 ENCOUNTER — Ambulatory Visit
Admission: RE | Admit: 2017-07-06 | Discharge: 2017-07-06 | Disposition: A | Discharge status: Home / Self Care | Payer: BLUE CROSS/BLUE SHIELD | Source: Ambulatory Visit | Attending: Cardiothoracic Surgery | Admitting: Cardiothoracic Surgery

## 2017-07-06 ENCOUNTER — Ambulatory Visit: Payer: BLUE CROSS/BLUE SHIELD

## 2017-07-06 VITALS — BP 122/79 | HR 88 | Resp 20 | Ht <= 58 in | Wt 124.0 lb

## 2017-07-06 DIAGNOSIS — J939 Pneumothorax, unspecified: Secondary | ICD-10-CM

## 2017-07-06 DIAGNOSIS — R531 Weakness: Secondary | ICD-10-CM | POA: Diagnosis not present

## 2017-07-06 DIAGNOSIS — M545 Low back pain, unspecified: Secondary | ICD-10-CM

## 2017-07-06 DIAGNOSIS — R29898 Other symptoms and signs involving the musculoskeletal system: Secondary | ICD-10-CM | POA: Diagnosis not present

## 2017-07-06 IMAGING — CR DG CHEST 2V
2 series · 2 of 2 positions shown · non-contrast
Comparison: PA and lateral chest 06/27/2017 and 06/26/2017.

CLINICAL DATA: History of spontaneous right pneumothorax first seen
on plain films of the chest 06/25/2017.

EXAM:
CHEST - 2 VIEW

[w chest pa]
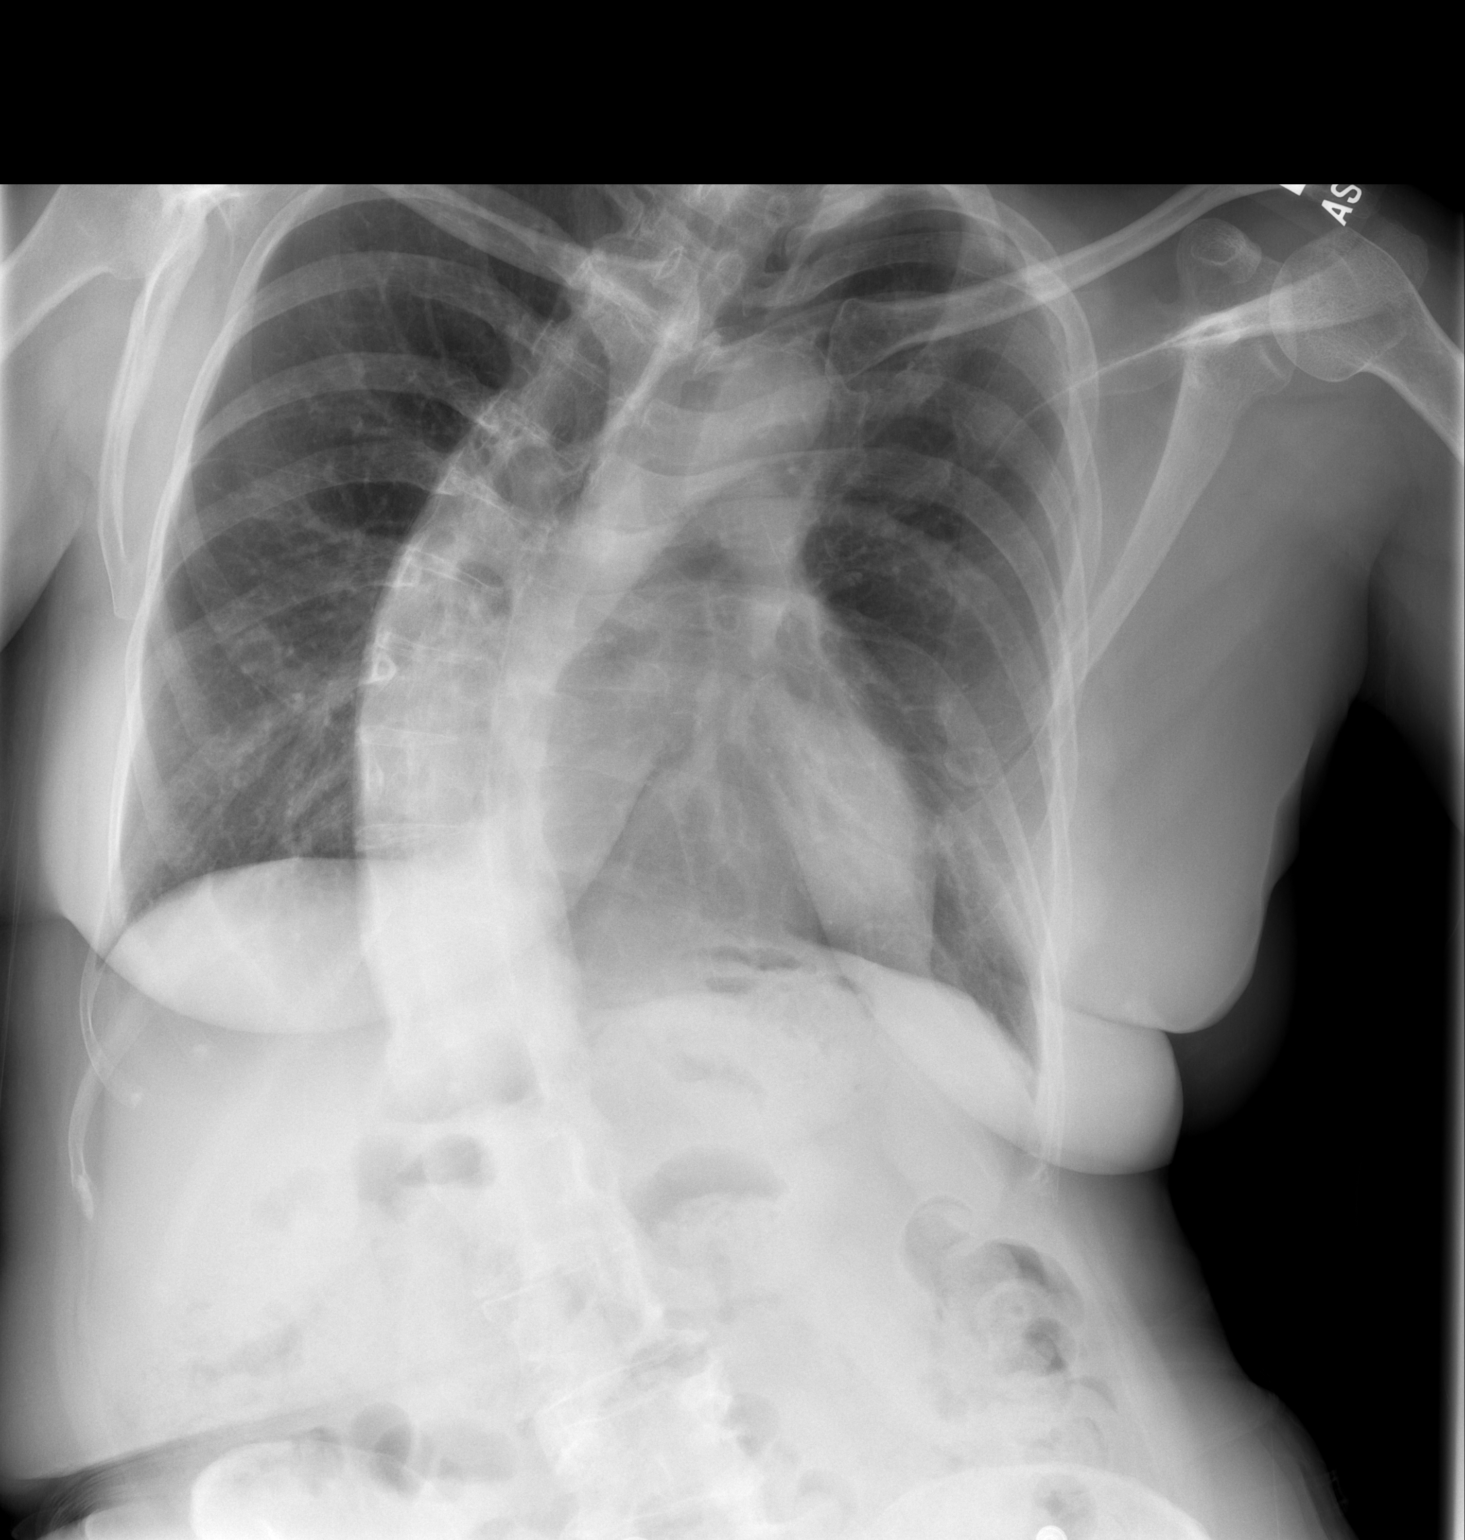

[w chest lat]
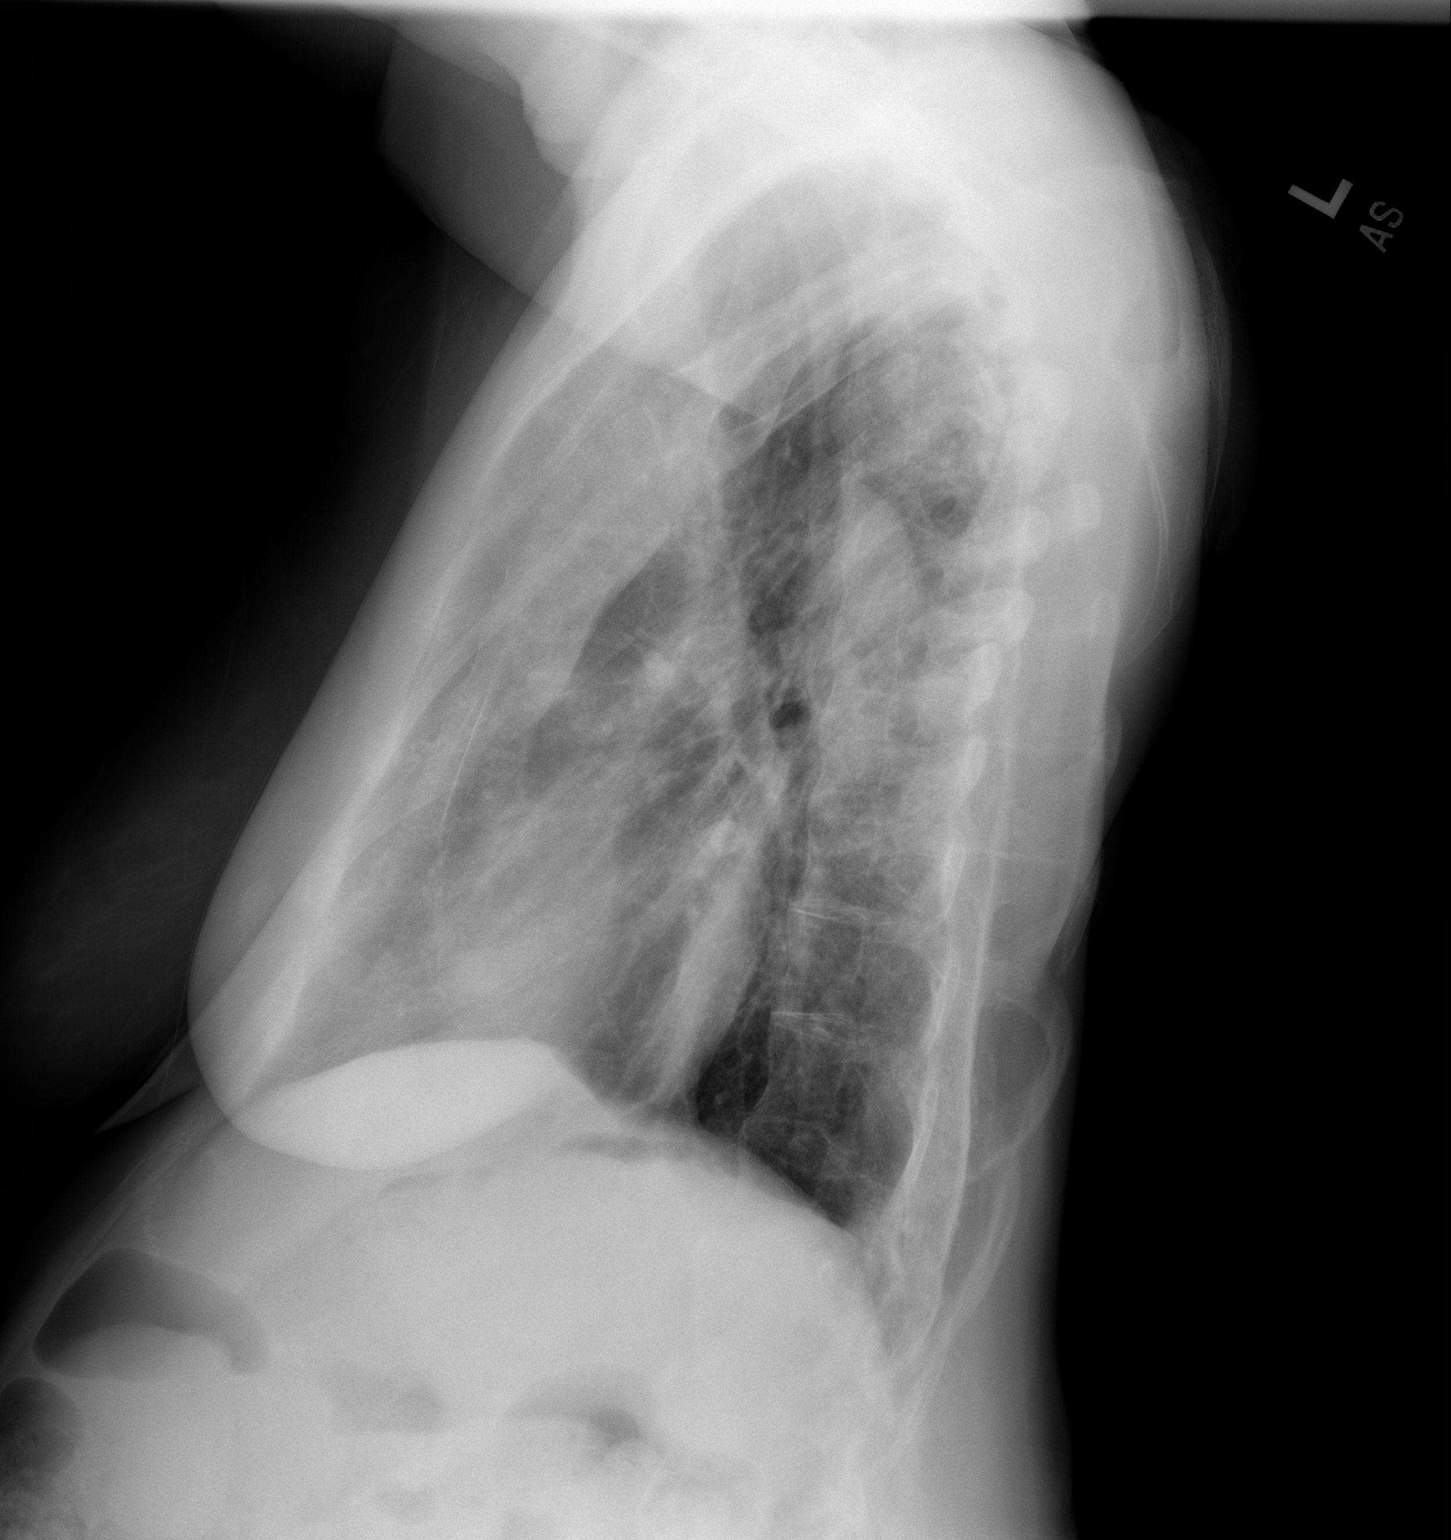

[2 of 2 positions shown; findings below may reference images not displayed]

FINDINGS: Right pneumothorax seen on the comparison examinations has resolved.
The lungs are clear. Heart size is normal. No pleural effusion.
Marked convex right thoracolumbar scoliosis noted.
IMPRESSION: Resolved right pneumothorax.  No acute abnormality.

Scoliosis.

## 2017-07-06 NOTE — Telephone Encounter (Signed)
Pt contacted pre-catheterization scheduled at [facility] for: Tuesday July 07, 2017 7:30 AM Verified arrival time and place: [place] Entr[facility] A/[place] at: 5:30 AM Nothing to eat or drink after midnight prior to cath Verified allergies in Epic. Verified no diabetes medications  Verified AM meds can be  taken pre-cath with sip of water including: ASA 81 mg  Confirmed patient has responsible person to drive home post procedure and observe patient for 24 hours: yes

## 2017-07-06 NOTE — Progress Notes (Unsigned)
HPI:  Patient returns for routine postoperative follow-up having right pigtail placement for spontaneous pneumothorax that occurred as a result of receiving dry needling  At time of discharge her pneumothorax was stable in appearance.  Currently she is asymptomatic denying chest pain and shortness of breath.  She also states Dr. Jola Baptist wants [place] to clear her to proceed with her ablation procedure.    Current Outpatient Medications  Medication Sig Dispense Refill  . albuterol (PROVENTIL HFA;VENTOLIN HFA) 108 (90 Base) MCG/ACT inhaler Inhale 1-2 puffs into the lungs every 4 (four) hours as needed for wheezing or shortness of breath. 1 Inhaler 3  . aspirin EC 81 MG tablet Take 81 mg by mouth at bedtime.    . busPIRone (BUSPAR) 30 MG tablet TAKE 1 TABLET BY MOUTH TWICE A DAY (Patient taking differently: Take 30 mg by mouth two times a day) 60 tablet 3  . calcium-vitamin D (OSCAL WITH D) 500-200 MG-UNIT tablet Take 1 tablet by mouth daily.    . clobetasol (TEMOVATE) 0.05 % external solution Apply 1 application topically 2 (two) times daily. (Patient taking differently: Apply 1 application topically 2 (two) times daily as needed (for itching). ) 50 mL 1  . clonazePAM (KLONOPIN) 0.5 MG tablet Take 1 tablet (0.5 mg total) by mouth daily as needed for anxiety. (Patient taking differently: Take 0.5 mg by mouth at bedtime. ) 30 tablet 1  . denosumab (PROLIA) 60 MG/ML SOLN injection Inject 60 mg into the skin every 6 (six) months. Administer in upper arm, thigh, or abdomen    . desvenlafaxine (PRISTIQ) 100 MG 24 hr tablet TAKE 1 TABLET BY MOUTH EVERY DAY (Patient taking differently: Take 100 mg by mouth at bedtime) 30 tablet 4  . desvenlafaxine (PRISTIQ) 50 MG 24 hr tablet Take 50 mg by mouth at bedtime 90 tablet 1  . dexlansoprazole (DEXILANT) 60 MG capsule TAKE 1 CAPSULE BY MOUTH EVERY DAY (Patient not taking: Reported on 07/02/2017) 90 capsule 3  . diclofenac sodium (VOLTAREN) 1 % GEL APPLY 4G TOPICALLY 4  TIMES DAILY (Patient taking differently: Apply 4 g topically 4 (four) times daily as needed (to affected painful sites). ) 100 g 12  . escitalopram (LEXAPRO) 10 MG tablet Take 2 tablets (20 mg total) by mouth at bedtime. 60 tablet 0  . flunisolide (NASAREL) 29 MCG/ACT (0.025%) nasal spray Place 2 sprays into the nose daily as needed for rhinitis.     Marland Kitchen gabapentin (NEURONTIN) 300 MG capsule TAKE 1 CAP AT BEDTIME X1 WEEK,1 CAP TWICE DAILY X1 WEEK THEN 1 CAP 3X DAILY MAY DOUBLE WEEKLY=3600MG  (Patient taking differently: Take 300 mg by mouth in the morning and 600 mg at bedtime) 180 capsule 3  . hydrOXYzine (ATARAX/VISTARIL) 25 MG tablet Take 1 tablet (25 mg total) by mouth 3 (three) times daily as needed. For chest tightness and/or scalp itching (Patient taking differently: Take 25 mg by mouth at bedtime. ) 30 tablet 1  . ipratropium (ATROVENT) 0.06 % nasal spray Place 2 sprays into both nostrils 4 (four) times daily. 15 mL 1  . levothyroxine (SYNTHROID, LEVOTHROID) 125 MCG tablet TAKE ONE-HALF TABLET BY MOUTH ONCE DAILY (Patient taking differently: Take 62.5 mcg by mouth once a day) 30 tablet 1  . lidocaine (LIDODERM) 5 % Place 1 patch onto the skin daily. Remove & Discard patch within 12 hours or as directed by MD (Patient taking differently: Place 1 patch onto the skin daily as needed (for pain). Remove & Discard patch within 12 hours  or as directed by MD) 30 patch 12  . meloxicam (MOBIC) 15 MG tablet Take 15 mg by mouth daily.     . Menthol, Topical Analgesic, (MINERAL ICE EX) Apply 1 application to affected sites one to two times a day as needed (for pain)    . naproxen sodium (ANAPROX) 220 MG tablet Take 220 mg by mouth as needed (pain).     . nortriptyline (PAMELOR) 25 MG capsule TAKE 1 CAPSULE (25 MG TOTAL) BY MOUTH AT BEDTIME 30 capsule 1  . Omega-3 Fatty Acids (FISH OIL PO) Take 1 capsule by mouth daily.     . pantoprazole (PROTONIX) 40 MG tablet Take 1 tablet (40 mg total) by mouth daily.  (Patient taking differently: Take 40 mg by mouth every morning. ) 90 tablet 1  . traZODone (DESYREL) 50 MG tablet TAKE 1 TABLET BY MOUTH EVERYDAY AT BEDTIME 90 tablet 1  . triamcinolone ointment (KENALOG) 0.1 % Apply 1 application topically 2 (two) times daily. To affected area(s) as needed. Use no longer than 1-2 weeks. 30 g 0   No current facility-administered medications for this visit.     Physical Exam:  BP 122/79   Pulse 88   Resp 20   Ht 4\' 10"  (1.473 m)   Wt 124 lb (56.2 kg)   SpO2 97% Comment: RA  BMI 25.92 kg/m   Gen; no apparent distress Heart: RRR  Lungs; CTA bilaterally Abd; soft- non-tender  Diagnostic Tests:  CXR; resolution of right sided pneumothorax  A/p:  1. Spontaneous pneumothorax- post dry needling procedure,  2. RTC prn 3. Will discuss with dr. Servando Snare when its okay to proceed with ablation procedure.  However being pneumothorax is resolved I would not anticipate any reason to delay.  Ellwood Handler, PA-C [facility] [phone]

## 2017-07-06 NOTE — Therapy (Signed)
[place], [place], [address] Phone: [phone]   Fax:  [phone]  Physical Therapy Treatment  Patient Details  Name: Leslie Roberson MRN: [id number] Date of Birth: 27-Jan-1956 Referring Provider: Dr Lynne Leader    Encounter Date: 07/06/2017  PT End of Session - 07/06/17 1134    Visit Number  6    Number of Visits  12    Date for PT Re-Evaluation  07/29/17    PT Start Time  1104    PT Stop Time  1201    PT Time Calculation (min)  57 min    Activity Tolerance  Patient tolerated treatment well       Past Medical History:  Diagnosis Date  . Anxiety   . Asthma, chronic 11/27/2015  . Depression   . Diverticulitis of colon 12/21/2015   Colonoscopy every 5 years/digestive health.   . GERD (gastroesophageal reflux disease) 11/27/2015  . Idiopathic scoliosis 01/22/2016  . Osteoporosis 11/27/2015   On prolia.   . Pneumothorax 06/26/2017  . Pre-diabetes   . Thyroid disease     Past Surgical History:  Procedure Laterality Date  . ABDOMINAL HYSTERECTOMY    . bladder mesh    . ELBOW SURGERY Right   . SPINAL FUSION    . TONSILLECTOMY    . TOTAL HIP ARTHROPLASTY      There were no vitals filed for this visit.  Subjective Assessment - 07/06/17 1138    Subjective  Rt shoulder and LB are really hurting today. May be related to reaching up with Rt UE. Not sure whay she is flared up.     Currently in Pain?  Yes    Pain Score  7     Pain Location  Back    Pain Orientation  Right;Lower    Pain Descriptors / Indicators  Aching    Pain Type  Acute pain;Chronic pain    Pain Onset  More than a month ago    Pain Frequency  Intermittent                       [facility] Adult PT Treatment/Exercise - 07/06/17 0001      Lumbar Exercises: Stretches   Double Knee to Chest Stretch  3 reps;30 seconds    Piriformis Stretch  Right;3 reps;30 seconds supine travell    Other Lumbar Stretch Exercise   sitting with lateral trunk flexion over bolster for 3 reps of 30 sec hold     Other Lumbar Stretch Exercise  pec stretch 3 positions 30 sec x 2 reps each       Moist Heat Therapy   Number Minutes Moist Heat  20 Minutes    Moist Heat Location  Lumbar Spine      Electrical Stimulation   Electrical Stimulation Location  Rt lumbar/SI area to Rt upper trap/scapular area      Electrical Stimulation Action  IFC    Electrical Stimulation Parameters  to tolerance    Electrical Stimulation Goals  Pain;Tone      Ultrasound   Ultrasound Location  Rt upper trap/leveator     Ultrasound Parameters  1.5 c/cm2; 1 mHz; 100%; 8 min     Ultrasound Goals  Pain;Other (Comment) tightness       Manual Therapy   Manual therapy comments  pt prone     Soft tissue mobilization  soft tissue work Rt lumbar/QL/SI area into the Rt scapular/upper trap area  Myofascial Release  Rt lumbar/sacral area        Trigger Point Dry Needling - 07/06/17 1140    Consent Given?  Yes    Muscles Treated Lower Body  -- Rt lumbar/QL/multifidi with estim     Longissimus Response  Palpable increased muscle length                PT Long Term Goals - 07/06/17 1137      PT LONG TERM GOAL #1   Title  independent with HEP (07/29/17)    Time  6    Period  Weeks    Status  On-going      PT LONG TERM GOAL #2   Title  improve lumbar spine mobility by 10-20% throughout  (07/29/17)    Time  6    Period  Weeks    Status  On-going      PT LONG TERM GOAL #3   Title  report ability to walk > 15 min without increase in pain for improved activity tolerance and decreased pain (07/29/17)    Time  6    Period  Weeks    Status  On-going      PT LONG TERM GOAL #4   Title  improve FOTO to </= 42% limitation (07/29/17)    Time  6    Period  Weeks    Status  On-going      PT LONG TERM GOAL #5   Title  decrease pain in Rt hip by 50-75% allowing patient to return to normal functional activitiy level (07/29/17)    Time  6     Period  Weeks    Status  On-going            Plan - 07/06/17 1135    Clinical Impression Statement  Increased pain in the Rt shoulder girdle area and Rt lumbar spine. Patient responded well to modalities and manual therapy with decreased tightness noted following treatment. Discussed options for reaching up which may be resulting in increased shoulder and back pain. Discussed the importance of consistent exercise. Will add exercises for posterior shoulder girdle at next visit.     Rehab Potential  Good    PT Frequency  2x / week    PT Duration  6 weeks    PT Treatment/Interventions  Patient/family education;ADLs/Self Care Home Management;Cryotherapy;Electrical Stimulation;Iontophoresis 4mg /ml Dexamethasone;Ultrasound;Dry needling;Manual techniques;Neuromuscular re-education;Therapeutic activities;Therapeutic exercise    PT Next Visit Plan  continue DN for lumbar spine; manual work; ther exercise; assess response to [place]; modalities as indicated     Consulted and Agree with Plan of Care  Patient       Patient will benefit from skilled therapeutic intervention in order to improve the following deficits and impairments:  Postural dysfunction, Improper body mechanics, Pain, Increased fascial restricitons, Increased muscle spasms, Hypomobility, Decreased mobility, Decreased range of motion, Decreased activity tolerance  Visit Diagnosis: Acute right-sided low back pain without sciatica  Other symptoms and signs involving the musculoskeletal system  Weakness generalized     Problem List Patient Active Problem List   Diagnosis Date Noted  . Pneumothorax 06/25/2017  . Atypical chest pain 05/24/2017  . Itchy scalp 05/24/2017  . Right leg pain 02/06/2017  . Chronic tension-type headache, intractable 02/05/2017  . Prediabetes 02/05/2017  . Lump in throat 01/27/2017  . Dysphagia 01/27/2017  . No energy 09/15/2016  . Bad taste in mouth 07/26/2016  . Paresthesia 04/03/2016  . Facet  hypertrophy of lumbosacral region 04/03/2016  .  Chronic back pain 04/03/2016  . Right knee pain 02/19/2016  . Rosacea 02/04/2016  . Vaginal atrophy 01/31/2016  . Idiopathic scoliosis 01/22/2016  . Hip bursitis 12/25/2015  . Biceps tendonitis on right 12/25/2015  . Diverticulitis of colon 12/21/2015  . GERD (gastroesophageal reflux disease) 11/27/2015  . Osteoporosis 11/27/2015  . GAD (generalized anxiety disorder) 11/27/2015  . MDD (major depressive disorder), recurrent, in full remission ([place]) 11/27/2015  . Chronic dryness of both eyes 11/27/2015  . Hypothyroidism 11/27/2015  . Asthma, chronic 11/27/2015  . Insomnia 11/27/2015  . Seborrheic keratoses 11/27/2015    Jadin Kagel Nilda Simmer PT, MPH  07/06/2017, 11:42 AM  [facility] [place], [place], [address] Phone: [phone]   Fax:  [phone]  Name: Leslie Roberson MRN: [id number] Date of Birth: [birth year]

## 2017-07-07 ENCOUNTER — Ambulatory Visit (HOSPITAL_COMMUNITY): Payer: Self-pay | Admitting: Licensed Clinical Social Worker

## 2017-07-07 ENCOUNTER — Encounter (HOSPITAL_COMMUNITY): Payer: Self-pay | Admitting: Cardiology

## 2017-07-07 ENCOUNTER — Ambulatory Visit (HOSPITAL_COMMUNITY)
Admission: RE | Disposition: A | Discharge status: Home / Self Care | Payer: Self-pay | Source: Ambulatory Visit | Attending: Cardiology

## 2017-07-07 ENCOUNTER — Other Ambulatory Visit: Payer: Self-pay

## 2017-07-07 ENCOUNTER — Ambulatory Visit (HOSPITAL_COMMUNITY)
Admission: RE | Admit: 2017-07-07 | Discharge: 2017-07-08 | Disposition: A | Discharge status: Home / Self Care | Payer: BLUE CROSS/BLUE SHIELD | Source: Ambulatory Visit | Attending: Cardiology | Admitting: Cardiology

## 2017-07-07 DIAGNOSIS — Z7982 Long term (current) use of aspirin: Secondary | ICD-10-CM | POA: Insufficient documentation

## 2017-07-07 DIAGNOSIS — M810 Age-related osteoporosis without current pathological fracture: Secondary | ICD-10-CM | POA: Insufficient documentation

## 2017-07-07 DIAGNOSIS — I2584 Coronary atherosclerosis due to calcified coronary lesion: Secondary | ICD-10-CM | POA: Diagnosis not present

## 2017-07-07 DIAGNOSIS — Z8249 Family history of ischemic heart disease and other diseases of the circulatory system: Secondary | ICD-10-CM | POA: Insufficient documentation

## 2017-07-07 DIAGNOSIS — F419 Anxiety disorder, unspecified: Secondary | ICD-10-CM | POA: Diagnosis not present

## 2017-07-07 DIAGNOSIS — E039 Hypothyroidism, unspecified: Secondary | ICD-10-CM | POA: Insufficient documentation

## 2017-07-07 DIAGNOSIS — I2510 Atherosclerotic heart disease of native coronary artery without angina pectoris: Secondary | ICD-10-CM

## 2017-07-07 DIAGNOSIS — J45909 Unspecified asthma, uncomplicated: Secondary | ICD-10-CM | POA: Insufficient documentation

## 2017-07-07 DIAGNOSIS — Z9889 Other specified postprocedural states: Secondary | ICD-10-CM | POA: Insufficient documentation

## 2017-07-07 DIAGNOSIS — I25118 Atherosclerotic heart disease of native coronary artery with other forms of angina pectoris: Secondary | ICD-10-CM | POA: Diagnosis not present

## 2017-07-07 DIAGNOSIS — R7303 Prediabetes: Secondary | ICD-10-CM | POA: Diagnosis not present

## 2017-07-07 DIAGNOSIS — I25119 Atherosclerotic heart disease of native coronary artery with unspecified angina pectoris: Secondary | ICD-10-CM

## 2017-07-07 DIAGNOSIS — I209 Angina pectoris, unspecified: Secondary | ICD-10-CM | POA: Clinically undetermined

## 2017-07-07 DIAGNOSIS — F329 Major depressive disorder, single episode, unspecified: Secondary | ICD-10-CM | POA: Diagnosis not present

## 2017-07-07 DIAGNOSIS — K219 Gastro-esophageal reflux disease without esophagitis: Secondary | ICD-10-CM | POA: Insufficient documentation

## 2017-07-07 DIAGNOSIS — Z885 Allergy status to narcotic agent status: Secondary | ICD-10-CM | POA: Insufficient documentation

## 2017-07-07 DIAGNOSIS — Z955 Presence of coronary angioplasty implant and graft: Secondary | ICD-10-CM

## 2017-07-07 HISTORY — DX: Hypothyroidism, unspecified: E03.9

## 2017-07-07 HISTORY — DX: Other chronic pain: G89.29

## 2017-07-07 HISTORY — DX: Unspecified osteoarthritis, unspecified site: M19.90

## 2017-07-07 HISTORY — DX: Dorsalgia, unspecified: M54.9

## 2017-07-07 HISTORY — DX: Personal history of other medical treatment: Z92.89

## 2017-07-07 HISTORY — PX: CORONARY PRESSURE/FFR STUDY: CATH118243

## 2017-07-07 HISTORY — PX: LEFT HEART CATH AND CORONARY ANGIOGRAPHY: CATH118249

## 2017-07-07 HISTORY — PX: INTRAVASCULAR PRESSURE WIRE/FFR STUDY: CATH118243

## 2017-07-07 HISTORY — DX: Atherosclerotic heart disease of native coronary artery without angina pectoris: I25.10

## 2017-07-07 HISTORY — DX: Pneumonia, unspecified organism: J18.9

## 2017-07-07 HISTORY — PX: CORONARY STENT INTERVENTION: CATH118234

## 2017-07-07 LAB — POCT ACTIVATED CLOTTING TIME
ACTIVATED CLOTTING TIME: 246 s
ACTIVATED CLOTTING TIME: 329 s

## 2017-07-07 SURGERY — LEFT HEART CATH AND CORONARY ANGIOGRAPHY
Anesthesia: LOCAL

## 2017-07-07 MED ORDER — SODIUM CHLORIDE 0.9 % IV SOLN: 250.0000 mL | INTRAVENOUS | Status: DC | PRN

## 2017-07-07 MED ORDER — GABAPENTIN 300 MG PO CAPS
300.0000 mg | ORAL_CAPSULE | Freq: Every day | ORAL | Status: DC
  Administered 2017-07-08: 09:00:00 300 mg via ORAL
  Filled 2017-07-07: qty 1

## 2017-07-07 MED ORDER — LEVOTHYROXINE SODIUM 125 MCG PO TABS
62.5000 ug | ORAL_TABLET | Freq: Every day | ORAL | Status: DC
  Administered 2017-07-08: 07:00:00 62.5 ug via ORAL
  Filled 2017-07-07: qty 0.5

## 2017-07-07 MED ORDER — ESCITALOPRAM OXALATE 20 MG PO TABS
20.0000 mg | ORAL_TABLET | Freq: Every day | ORAL | Status: DC
  Administered 2017-07-07: 20 mg via ORAL
  Filled 2017-07-07: qty 1

## 2017-07-07 MED ORDER — SODIUM CHLORIDE 0.9 % WEIGHT BASED INFUSION: 1.0000 mL/kg/h | INTRAVENOUS | Status: DC

## 2017-07-07 MED ORDER — CLOPIDOGREL BISULFATE 75 MG PO TABS
75.0000 mg | ORAL_TABLET | Freq: Every day | ORAL | Status: DC
  Administered 2017-07-08: 09:00:00 75 mg via ORAL
  Filled 2017-07-07: qty 1

## 2017-07-07 MED ORDER — ADENOSINE 12 MG/4ML IV SOLN
INTRAVENOUS | Status: AC
  Filled 2017-07-07: qty 16

## 2017-07-07 MED ORDER — CLONAZEPAM 0.5 MG PO TABS
0.5000 mg | ORAL_TABLET | Freq: Two times a day (BID) | ORAL | Status: DC | PRN
  Administered 2017-07-07: 22:00:00 0.5 mg via ORAL
  Filled 2017-07-07: qty 1

## 2017-07-07 MED ORDER — VERAPAMIL HCL 2.5 MG/ML IV SOLN
INTRAVENOUS | Status: AC
  Filled 2017-07-07: qty 2

## 2017-07-07 MED ORDER — SODIUM CHLORIDE 0.9% FLUSH
3.0000 mL | Freq: Two times a day (BID) | INTRAVENOUS | Status: DC
  Administered 2017-07-07: 22:00:00 3 mL via INTRAVENOUS

## 2017-07-07 MED ORDER — NITROGLYCERIN 1 MG/10 ML FOR IR/CATH LAB
INTRA_ARTERIAL | Status: DC | PRN
  Administered 2017-07-07 (×3): 200 ug via INTRACORONARY

## 2017-07-07 MED ORDER — CLOPIDOGREL BISULFATE 300 MG PO TABS
ORAL_TABLET | ORAL | Status: AC
  Filled 2017-07-07: qty 2

## 2017-07-07 MED ORDER — FAMOTIDINE IN NACL 20-0.9 MG/50ML-% IV SOLN
INTRAVENOUS | Status: AC
  Filled 2017-07-07: qty 50

## 2017-07-07 MED ORDER — SODIUM CHLORIDE 0.9% FLUSH: 3.0000 mL | INTRAVENOUS | Status: DC | PRN

## 2017-07-07 MED ORDER — FENTANYL CITRATE (PF) 100 MCG/2ML IJ SOLN
INTRAMUSCULAR | Status: DC | PRN
  Administered 2017-07-07 (×2): 25 ug via INTRAVENOUS

## 2017-07-07 MED ORDER — FENTANYL CITRATE (PF) 100 MCG/2ML IJ SOLN
INTRAMUSCULAR | Status: AC
  Filled 2017-07-07: qty 2

## 2017-07-07 MED ORDER — ASPIRIN 81 MG PO CHEW: 81.0000 mg | CHEWABLE_TABLET | ORAL | Status: DC

## 2017-07-07 MED ORDER — MIDAZOLAM HCL 2 MG/2ML IJ SOLN
INTRAMUSCULAR | Status: DC | PRN
  Administered 2017-07-07: 1 mg via INTRAVENOUS
  Administered 2017-07-07 (×2): 0.5 mg via INTRAVENOUS

## 2017-07-07 MED ORDER — VERAPAMIL HCL 2.5 MG/ML IV SOLN
INTRAVENOUS | Status: DC | PRN
  Administered 2017-07-07: 10 mL via INTRA_ARTERIAL

## 2017-07-07 MED ORDER — PANTOPRAZOLE SODIUM 40 MG PO TBEC
40.0000 mg | DELAYED_RELEASE_TABLET | Freq: Every day | ORAL | Status: DC
  Administered 2017-07-08: 09:00:00 40 mg via ORAL
  Filled 2017-07-07: qty 1

## 2017-07-07 MED ORDER — ONDANSETRON HCL 4 MG/2ML IJ SOLN: 4.0000 mg | Freq: Four times a day (QID) | INTRAMUSCULAR | Status: DC | PRN

## 2017-07-07 MED ORDER — GABAPENTIN 300 MG PO CAPS
600.0000 mg | ORAL_CAPSULE | Freq: Every day | ORAL | Status: DC
  Administered 2017-07-07: 600 mg via ORAL
  Filled 2017-07-07: qty 2

## 2017-07-07 MED ORDER — ADENOSINE (DIAGNOSTIC) 140MCG/KG/MIN
INTRAVENOUS | Status: DC | PRN
  Administered 2017-07-07: 140 ug/kg/min via INTRAVENOUS

## 2017-07-07 MED ORDER — IPRATROPIUM BROMIDE 0.06 % NA SOLN: 2.0000 | Freq: Four times a day (QID) | NASAL | Status: DC

## 2017-07-07 MED ORDER — FAMOTIDINE IN NACL 20-0.9 MG/50ML-% IV SOLN
INTRAVENOUS | Status: AC | PRN
  Administered 2017-07-07: 20 mg via INTRAVENOUS

## 2017-07-07 MED ORDER — HEPARIN SODIUM (PORCINE) 1000 UNIT/ML IJ SOLN
INTRAMUSCULAR | Status: DC | PRN
  Administered 2017-07-07: 3000 [IU] via INTRAVENOUS
  Administered 2017-07-07: 2000 [IU] via INTRAVENOUS
  Administered 2017-07-07: 3000 [IU] via INTRAVENOUS

## 2017-07-07 MED ORDER — MORPHINE SULFATE (PF) 4 MG/ML IV SOLN
INTRAVENOUS | Status: AC
  Filled 2017-07-07: qty 1

## 2017-07-07 MED ORDER — CLOPIDOGREL BISULFATE 300 MG PO TABS
ORAL_TABLET | ORAL | Status: DC | PRN
  Administered 2017-07-07: 600 mg via ORAL

## 2017-07-07 MED ORDER — MIDAZOLAM HCL 2 MG/2ML IJ SOLN
INTRAMUSCULAR | Status: AC
  Filled 2017-07-07: qty 2

## 2017-07-07 MED ORDER — LIDOCAINE HCL (PF) 1 % IJ SOLN
INTRAMUSCULAR | Status: DC | PRN
  Administered 2017-07-07: 4 mL via SUBCUTANEOUS

## 2017-07-07 MED ORDER — ACETAMINOPHEN 325 MG PO TABS
650.0000 mg | ORAL_TABLET | ORAL | Status: DC | PRN
  Administered 2017-07-07 (×2): 650 mg via ORAL
  Filled 2017-07-07: qty 2

## 2017-07-07 MED ORDER — NITROGLYCERIN 1 MG/10 ML FOR IR/CATH LAB
INTRA_ARTERIAL | Status: AC
  Filled 2017-07-07: qty 10

## 2017-07-07 MED ORDER — ASPIRIN EC 81 MG PO TBEC: 81.0000 mg | DELAYED_RELEASE_TABLET | Freq: Every day | ORAL | Status: DC

## 2017-07-07 MED ORDER — HEPARIN SODIUM (PORCINE) 1000 UNIT/ML IJ SOLN
INTRAMUSCULAR | Status: AC
  Filled 2017-07-07: qty 1

## 2017-07-07 MED ORDER — GABAPENTIN 300 MG PO CAPS
300.0000 mg | ORAL_CAPSULE | Freq: Three times a day (TID) | ORAL | Status: DC
  Administered 2017-07-07: 22:00:00 300 mg via ORAL
  Filled 2017-07-07 (×2): qty 1

## 2017-07-07 MED ORDER — ACETAMINOPHEN 325 MG PO TABS
ORAL_TABLET | ORAL | Status: AC
  Filled 2017-07-07: qty 2

## 2017-07-07 MED ORDER — ANGIOPLASTY BOOK
Freq: Once | Status: AC
  Administered 2017-07-08: 07:00:00
  Filled 2017-07-07: qty 1

## 2017-07-07 MED ORDER — SODIUM CHLORIDE 0.9% FLUSH: 3.0000 mL | Freq: Two times a day (BID) | INTRAVENOUS | Status: DC

## 2017-07-07 MED ORDER — SODIUM CHLORIDE 0.9 % IV SOLN: INTRAVENOUS | Status: AC

## 2017-07-07 MED ORDER — IOHEXOL 350 MG/ML SOLN
INTRAVENOUS | Status: DC | PRN
  Administered 2017-07-07: 170 mL via INTRA_ARTERIAL

## 2017-07-07 MED ORDER — ALBUTEROL SULFATE (2.5 MG/3ML) 0.083% IN NEBU: 2.5000 mg | INHALATION_SOLUTION | RESPIRATORY_TRACT | Status: DC | PRN

## 2017-07-07 MED ORDER — BUSPIRONE HCL 15 MG PO TABS
30.0000 mg | ORAL_TABLET | Freq: Two times a day (BID) | ORAL | Status: DC
  Administered 2017-07-07 – 2017-07-08 (×2): 30 mg via ORAL
  Filled 2017-07-07 (×2): qty 2

## 2017-07-07 MED ORDER — HYDRALAZINE HCL 20 MG/ML IJ SOLN: 5.0000 mg | INTRAMUSCULAR | Status: AC | PRN

## 2017-07-07 MED ORDER — TRAZODONE HCL 50 MG PO TABS
50.0000 mg | ORAL_TABLET | Freq: Every evening | ORAL | Status: DC | PRN
Start: 2017-07-07 — End: 2017-07-08
  Administered 2017-07-07: 22:00:00 50 mg via ORAL
  Filled 2017-07-07: qty 1

## 2017-07-07 MED ORDER — LIDOCAINE HCL 1 % IJ SOLN
INTRAMUSCULAR | Status: AC
  Filled 2017-07-07: qty 20

## 2017-07-07 MED ORDER — SODIUM CHLORIDE 0.9 % WEIGHT BASED INFUSION
3.0000 mL/kg/h | INTRAVENOUS | Status: DC
  Administered 2017-07-07: 3 mL/kg/h via INTRAVENOUS

## 2017-07-07 MED ORDER — LABETALOL HCL 5 MG/ML IV SOLN: 10.0000 mg | INTRAVENOUS | Status: AC | PRN

## 2017-07-07 MED ORDER — MORPHINE SULFATE (PF) 2 MG/ML IV SOLN
2.0000 mg | Freq: Once | INTRAVENOUS | Status: AC
  Administered 2017-07-07: 2 mg via INTRAVENOUS

## 2017-07-07 MED ORDER — HEPARIN (PORCINE) IN NACL 2-0.9 UNIT/ML-% IJ SOLN
INTRAMUSCULAR | Status: AC
  Filled 2017-07-07: qty 1000

## 2017-07-07 SURGICAL SUPPLY — 25 items
BALLN SAPPHIRE 2.5X12 (BALLOONS) ×2
BALLN SAPPHIRE ~~LOC~~ 2.75X12 (BALLOONS) ×2 IMPLANT
BALLN SAPPHIRE ~~LOC~~ 3.25X15 (BALLOONS) ×2 IMPLANT
BALLOON SAPPHIRE 2.5X12 (BALLOONS) ×1 IMPLANT
BAND ZEPHYR COMPRESS 30 LONG (HEMOSTASIS) ×2 IMPLANT
CATH INFINITI 5FR ANG PIGTAIL (CATHETERS) ×2 IMPLANT
CATH LAUNCHER 6FR EBU3.5 (CATHETERS) ×4 IMPLANT
CATH LAUNCHER 6FR JR4 (CATHETERS) ×2 IMPLANT
CATH MICROCATH NAVVUS (MICROCATHETER) ×1 IMPLANT
CATH OPTITORQUE TIG 4.0 5F (CATHETERS) ×2 IMPLANT
GUIDEWIRE INQWIRE 1.5J.035X260 (WIRE) ×1 IMPLANT
INQWIRE 1.5J .035X260CM (WIRE) ×2
KIT ENCORE 26 ADVANTAGE (KITS) ×2 IMPLANT
KIT HEART LEFT (KITS) ×2 IMPLANT
KIT HEMO VALVE WATCHDOG (MISCELLANEOUS) ×2 IMPLANT
MICROCATHETER NAVVUS (MICROCATHETER) ×2
PACK CARDIAC CATHETERIZATION (CUSTOM PROCEDURE TRAY) ×2 IMPLANT
SHEATH RAIN RADIAL 21G 6FR (SHEATH) ×2 IMPLANT
STENT SYNERGY DES 2.5X16 (Permanent Stent) IMPLANT
STENT SYNERGY DES 2.5X28 (Permanent Stent) ×2 IMPLANT
STENT SYNERGY DES 3X16 (Permanent Stent) ×2 IMPLANT
TRANSDUCER W/STOPCOCK (MISCELLANEOUS) ×2 IMPLANT
TUBING CIL FLEX 10 FLL-RA (TUBING) ×2 IMPLANT
WIRE HI TORQ VERSACORE-J 145CM (WIRE) ×2 IMPLANT
WIRE MARVEL STR TIP 190CM (WIRE) ×2 IMPLANT

## 2017-07-07 NOTE — Progress Notes (Signed)
TR BAND REMOVAL  LOCATION:  right radial  DEFLATED PER PROTOCOL:  Yes.    TIME BAND OFF / DRESSING APPLIED:   1500   SITE UPON ARRIVAL:   Level 0  SITE AFTER BAND REMOVAL:  Level 0  CIRCULATION SENSATION AND MOVEMENT:  Within Normal Limits  Yes.    COMMENTS:    

## 2017-07-07 NOTE — Interval H&P Note (Signed)
History and Physical Interval Note:  07/07/2017 7:27 AM  [place]  has presented today for surgery, with the diagnosis of excertional angina (class III) The various methods of treatment have been discussed with the patient and family. After consideration of risks, benefits and other options for treatment, the patient has consented to  Procedure(s): LEFT HEART CATH AND CORONARY ANGIOGRAPHY (N/A) with possible PERCUTANEOUS CORONARY INTERVENTION as a surgical intervention .  The patient's history has been reviewed, patient examined, no change in status, stable for surgery.  I have reviewed the patient's chart and labs.  Questions were answered to the patient's satisfaction.     Cath Lab Visit (complete for each Cath Lab visit)  Clinical Evaluation Leading to the Procedure:   ACS: No.  Non-ACS:    Anginal Classification: CCS III  Anti-ischemic medical therapy: Minimal Therapy (1 class of medications)  Non-Invasive Test Results: Equivocal test results - CONTINUED sX DESPITE NEGATIVE GXT  Prior CABG: No previous CABG   Glenetta Hew

## 2017-07-08 ENCOUNTER — Other Ambulatory Visit: Payer: Self-pay | Admitting: Cardiology

## 2017-07-08 ENCOUNTER — Telehealth: Payer: Self-pay | Admitting: Cardiology

## 2017-07-08 ENCOUNTER — Telehealth (HOSPITAL_COMMUNITY): Payer: Self-pay

## 2017-07-08 ENCOUNTER — Encounter (HOSPITAL_COMMUNITY): Payer: Self-pay | Admitting: Cardiology

## 2017-07-08 DIAGNOSIS — Z7982 Long term (current) use of aspirin: Secondary | ICD-10-CM | POA: Diagnosis not present

## 2017-07-08 DIAGNOSIS — I209 Angina pectoris, unspecified: Secondary | ICD-10-CM | POA: Diagnosis not present

## 2017-07-08 DIAGNOSIS — I25119 Atherosclerotic heart disease of native coronary artery with unspecified angina pectoris: Secondary | ICD-10-CM | POA: Diagnosis not present

## 2017-07-08 DIAGNOSIS — Z885 Allergy status to narcotic agent status: Secondary | ICD-10-CM | POA: Diagnosis not present

## 2017-07-08 DIAGNOSIS — I2584 Coronary atherosclerosis due to calcified coronary lesion: Secondary | ICD-10-CM | POA: Diagnosis not present

## 2017-07-08 DIAGNOSIS — K219 Gastro-esophageal reflux disease without esophagitis: Secondary | ICD-10-CM | POA: Diagnosis not present

## 2017-07-08 DIAGNOSIS — M810 Age-related osteoporosis without current pathological fracture: Secondary | ICD-10-CM | POA: Diagnosis not present

## 2017-07-08 DIAGNOSIS — R7303 Prediabetes: Secondary | ICD-10-CM | POA: Diagnosis not present

## 2017-07-08 DIAGNOSIS — F419 Anxiety disorder, unspecified: Secondary | ICD-10-CM | POA: Diagnosis not present

## 2017-07-08 DIAGNOSIS — F329 Major depressive disorder, single episode, unspecified: Secondary | ICD-10-CM | POA: Diagnosis not present

## 2017-07-08 DIAGNOSIS — J45909 Unspecified asthma, uncomplicated: Secondary | ICD-10-CM | POA: Diagnosis not present

## 2017-07-08 DIAGNOSIS — Z8249 Family history of ischemic heart disease and other diseases of the circulatory system: Secondary | ICD-10-CM | POA: Diagnosis not present

## 2017-07-08 DIAGNOSIS — I25118 Atherosclerotic heart disease of native coronary artery with other forms of angina pectoris: Secondary | ICD-10-CM | POA: Diagnosis not present

## 2017-07-08 DIAGNOSIS — E039 Hypothyroidism, unspecified: Secondary | ICD-10-CM | POA: Diagnosis not present

## 2017-07-08 LAB — CBC
HEMATOCRIT: 39.8 % (ref 36.0–46.0)
HEMOGLOBIN: 12.5 g/dL (ref 12.0–15.0)
MCH: 29.1 pg (ref 26.0–34.0)
MCHC: 31.4 g/dL (ref 30.0–36.0)
MCV: 92.8 fL (ref 78.0–100.0)
Platelets: 199 10*3/uL (ref 150–400)
RBC: 4.29 MIL/uL (ref 3.87–5.11)
RDW: 13.7 % (ref 11.5–15.5)
WBC: 6.6 10*3/uL (ref 4.0–10.5)

## 2017-07-08 LAB — BASIC METABOLIC PANEL
ANION GAP: 9 (ref 5–15)
BUN: 19 mg/dL (ref 6–20)
CHLORIDE: 107 mmol/L (ref 101–111)
CO2: 25 mmol/L (ref 22–32)
Calcium: 8.7 mg/dL — ABNORMAL LOW (ref 8.9–10.3)
Creatinine, Ser: 0.77 mg/dL (ref 0.44–1.00)
GFR calc Af Amer: >60 mL/min (ref >60)
Glucose, Bld: 110 mg/dL — ABNORMAL HIGH (ref 65–99)
POTASSIUM: 4 mmol/L (ref 3.5–5.1)
SODIUM: 141 mmol/L (ref 135–145)

## 2017-07-08 MED ORDER — ATORVASTATIN CALCIUM 80 MG PO TABS: 80.0000 mg | ORAL_TABLET | Freq: Every day | ORAL | 1 refills | Status: DC

## 2017-07-08 MED ORDER — NITROGLYCERIN 0.4 MG SL SUBL: 0.4000 mg | SUBLINGUAL_TABLET | SUBLINGUAL | 2 refills | Status: DC | PRN

## 2017-07-08 MED ORDER — METOPROLOL TARTRATE 25 MG PO TABS: 12.5000 mg | ORAL_TABLET | Freq: Two times a day (BID) | ORAL | 1 refills | Status: DC

## 2017-07-08 MED ORDER — CLOPIDOGREL BISULFATE 75 MG PO TABS: 75.0000 mg | ORAL_TABLET | Freq: Every day | ORAL | 2 refills | Status: DC

## 2017-07-08 MED FILL — Morphine Sulfate Inj 4 MG/ML: INTRAMUSCULAR | Qty: 0.5 | Status: AC

## 2017-07-08 MED FILL — Lidocaine HCl Local Inj 1%: INTRAMUSCULAR | Qty: 20 | Status: AC

## 2017-07-08 MED FILL — Heparin Sodium (Porcine) 2 Unit/ML in Sodium Chloride 0.9%: INTRAMUSCULAR | Qty: 1000 | Status: AC

## 2017-07-08 NOTE — Progress Notes (Signed)
CARDIAC REHAB PHASE I   PRE:  Rate/Rhythm: 80 SR    BP: sitting 137/74    SaO2: 97 RA  MODE:  Ambulation: 450 ft   POST:  Rate/Rhythm: 100 ST    BP: sitting 135/78     SaO2:   Pt able to walk without angina or SOB. Doing well. Ed completed with good comprehension. She is interested in [place] and will refer to [place]. She understands the importance of Plavix/ASA. Reiterated importance of diet (pre DM) and ex. [id number]   [place], ACSM 07/08/2017 10:27 AM

## 2017-07-08 NOTE — Telephone Encounter (Signed)
New Message     TOC barrett 07/16/17 9am

## 2017-07-08 NOTE — Telephone Encounter (Signed)
Currently admitted.

## 2017-07-08 NOTE — Progress Notes (Signed)
Progress Note  Patient Name: Leslie Roberson Date of Encounter: 07/08/2017  Primary Cardiologist: Kirk Ruths, MD   Subjective   1-70-month history of exertional fatigue and chest discomfort.  Abnormal exercise treadmill test led to coronary angiography which identified severe two-vessel coronary disease involving RCA and LAD.  She underwent successful double vessel DES implantation by Dr. Ellyn Hack and has been stable overnight without recurrence of any chest pain symptoms or bleeding issues.  Inpatient Medications    Scheduled Meds: . aspirin EC  81 mg Oral QHS  . busPIRone  30 mg Oral BID  . clopidogrel  75 mg Oral Q breakfast  . escitalopram  20 mg Oral QHS  . gabapentin  300 mg Oral Daily  . gabapentin  600 mg Oral QHS  . levothyroxine  62.5 mcg Oral QAC breakfast  . pantoprazole  40 mg Oral Daily  . sodium chloride flush  3 mL Intravenous Q12H   Continuous Infusions: . sodium chloride     PRN Meds: sodium chloride, acetaminophen, albuterol, clonazePAM, ondansetron (ZOFRAN) IV, sodium chloride flush, traZODone   Vital Signs    Vitals:   07/07/17 2003 07/08/17 0532 07/08/17 0730 07/08/17 0807  BP: 126/76 135/77  (!) 138/52  Pulse: 70 73  75  Resp: (!) 26 18  16   Temp: 98.5 F (36.9 C) 97.9 F (36.6 C)  97.9 F (36.6 C)  TempSrc: Oral Oral  Oral  SpO2: 98% 93% 95% 95%  Weight:  125 lb 10.6 oz (57 kg)    Height:        Intake/Output Summary (Last 24 hours) at 07/08/2017 1003 Last data filed at 07/08/2017 0817 Gross per 24 hour  Intake 1186.67 ml  Output 1100 ml  Net 86.67 ml   Filed Weights   07/07/17 0551 07/08/17 0532  Weight: 125 lb (56.7 kg) 125 lb 10.6 oz (57 kg)    Telemetry    Normal sinus rhythm- Personally Reviewed  ECG    Poor R wave progression V1 through V3.  Normal sinus rhythm with nonspecific ST abnormality. - Personally Reviewed  Physical Exam  Small stature. GEN: No acute distress.   Neck: No JVD Cardiac: RRR, no murmurs,  rubs, or gallops.  No evidence of access site bleeding. Respiratory: Clear to auscultation bilaterally. GI: Soft, nontender, non-distended  MS: No edema; No deformity. Neuro:  Nonfocal  Psych: Normal affect   Labs    Chemistry Recent Labs  Lab 07/08/17 0311  NA 141  K 4.0  CL 107  CO2 25  GLUCOSE 110*  BUN 19  CREATININE 0.77  CALCIUM 8.7*  GFRNONAA >60  GFRAA >60  ANIONGAP 9     Hematology Recent Labs  Lab 07/08/17 0311  WBC 6.6  RBC 4.29  HGB 12.5  HCT 39.8  MCV 92.8  MCH 29.1  MCHC 31.4  RDW 13.7  PLT 199    Cardiac EnzymesNo results for input(s): TROPONINI in the last 168 hours. No results for input(s): TROPIPOC in the last 168 hours.   BNPNo results for input(s): BNP, PROBNP in the last 168 hours.   DDimer No results for input(s): DDIMER in the last 168 hours.   Radiology    Dg Chest 2 View  Result Date: 07/06/2017 CLINICAL DATA:  History of spontaneous right pneumothorax first seen on plain films of the chest 06/25/2017. EXAM: CHEST - 2 VIEW COMPARISON:  PA and lateral chest 06/27/2017 and 06/26/2017. FINDINGS: Right pneumothorax seen on the comparison examinations has resolved. The  lungs are clear. Heart size is normal. No pleural effusion. Marked convex right thoracolumbar scoliosis noted. IMPRESSION: Resolved right pneumothorax.  No acute abnormality. Scoliosis. Electronically Signed   By: Inge Rise M.D.   On: 07/06/2017 15:33    Cardiac Studies   Coronary Diagrams   Diagnostic Diagram       Post-Intervention Diagram          Patient Profile     62 y.o. female  with a history of asthma, GERD, pre-DM, hypothyroid, CP w/ poor exercise tolerance on ETT.  Coronary angiography demonstrated high-grade mid right coronary stenosis and segmental proximal to mid calcified intermediate LAD stenosis.  DES stents were placed in the right coronary LAD based upon FFR 0.73.   Assessment & Plan    1. 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  Double vessel PCI performed successfully without complications.  No chest pain overnight.  Plan is discharged today with clopidogrel/aspirin therapy for at least 6 months and preferably 12 given multiple stent implantation. 2. Aggressive risk factor modification: LDL less than 70, hemoglobin A1c less than 7, blood pressure 130/80 mmHg or less, aerobic activity, and consideration of phase 2 cardiac rehab.  After ambulation and education this morning, the patient will be discharged to follow-up in 7 days.  For questions or updates, please contact [place] Please consult [email or web address] for contact info under Cardiology/STEMI.      Signed, Sinclair Grooms, MD  07/08/2017, 10:03 AM

## 2017-07-08 NOTE — Telephone Encounter (Signed)
Pharmacy sent a fax stating that insurance will not pay for 2 tablets of the Escitalopram 10mg . They will pay for the 20mg  tablet. I called in a months supply of Escitalopram 20mg . Patient is currently in the hospital so I can not change medication in the system at this time.

## 2017-07-08 NOTE — Progress Notes (Unsigned)
.  my

## 2017-07-08 NOTE — Discharge Summary (Addendum)
The patient has been seen in conjunction with Reino Bellis, NP. All aspects of care have been considered and discussed. The patient has been personally interviewed, examined, and all clinical data has been reviewed.   Two-vessel coronary intervention as outlined in my earlier note.  Kidney function, hemoglobin, and access site are unremarkable/without evidence of complication.  Have encouraged phase 2 cardiac rehab enrollment.  Have discussed the importance of dual antiplatelet therapy and aggressive lipid lowering.  Follow-up transition of care appointment with Dr. Stanford Breed or team member in 7-10 days.  Discharge Summary    Patient ID: Leslie Roberson, MRN: [id number], DOB/AGE: 30-Apr-1955 62 y.o.  Admit date: 07/07/2017 Discharge date: 07/08/2017  Primary Care Provider: Donella Stade Primary Cardiologist: Dr. Stanford Breed  Discharge Diagnoses    Principal Problem:   Angina, class III [facility]) Active Problems:   Coronary artery disease involving native coronary artery of native heart with angina pectoris (HCC)   Allergies Allergies  Allergen Reactions  . Hydromorphone Rash  . Levofloxacin Other (See Comments)    Hallucinations  . Meperidine Nausea And Vomiting and Other (See Comments)    Also "doesn't work well for my pain"  . Broccoli [Brassica Oleracea Italica] Nausea Only and Other (See Comments)    Causes stomach pain also  . Adhesive [Tape] Rash    Diagnostic Studies/Procedures    Cath: 07/07/17  Conclusion     Culprit Lesion #1: Mid RCA to Dist RCA lesion is 99% stenosed.  A drug-eluting stent was successfully placed using a STENT SYNERGY DES 3X16. - Post-dilated to 3.3 mm  Post intervention, there is a 0% residual stenosis.  Ost LAD lesion is 30% stenosed. Mid LAD lesion is 30% stenosed. (before & after stent)  Prox LAD-1 lesion is 75% stenosed. Prox LAD-2 lesion is 60% stenosed. -- FFR 0.73  A drug-eluting stent was successfully placed using a  STENT SYNERGY DES 2.5X28. - Post-dilated to 2.8 mm  Post intervention, there is a 0% residual stenosis.  The left ventricular systolic function is normal. The left ventricular ejection fraction is 55-65% by visual estimate.  LV end diastolic pressure is mildly elevated.  There is no aortic valve stenosis.   Severe 2 Vessel CAD - m-dRCA 99%, p-mLAD 75% & 60% (FFR 0.73) -- Successful 2 Vessel DES PCI   Plan: Overnight observation post PCI. Zephyr band removal per protocol DAPT For minimum of 6 months uninterrupted, at that point, would be okay to temporarily hold for procedures if necessary, but preferably 1 year uninterrupted Continue risk factor modification with lipid and glycemic management along with blood pressure control.  Anticipate discharge tomorrow, follow-up with Dr. Stanford Breed   _____________   History of Present Illness     61 y.o.femalewith a history of asthma, GERD, pre-DM, hypothyroid, CP w/ poorexercise tolerance on ETT.  3/27 office visit, symptoms concerning for angina, cardiac cath recommended,ASA81 mg added. Admitted 03/28-30/30/2019 w/shortness of breath andCPwhich started at rest the day before her heart cath, PTX on CXR,improved from 20% to 15%,no chest tube needed.  Dr Servando Snare felt the punctured lung happened the day she was admitted, did not cause any of her earlier symptoms.   She presented back to the office on 07/02/17 for follow up. She has been taking it easy since d/c, but did have some CP w/ activity. It was the same chest tightness she has been having, resolved with rest.She had some mild shortness of breath with it, but no nausea, vomiting, or diaphoresis.  She has not noticed any new DOE, but has not been active. No orthopnea or PND. No LE edema. Original plan was for the patient to undergo cardiac cath. She was given clearance by Dr. Servando Snare to move forward with this. Cath procedure was discussed with the patient at office visit and  she was willing to proceed.    Hospital Course     Underwent cardiac cath noted above with severe 2v disease noted- m/dRCA 99% with PCI/DES x1 and p/mLAD 75% (FFR 0.73) with PCI/DES x2. Plan for DAPT with ASA/plavix for one year. EF normal on LV gram. Observed overnight without any complications noted. Morning labs were stable. Worked well with cardiac rehab. Added high dose statin, and low dose metoprolol 12.5mg  BID prior to discharge.   General: Well developed, well nourished, female appearing in no acute distress. Head: Normocephalic, atraumatic.  Neck: Supple without bruits, JVD. Lungs:  Resp regular and unlabored, CTA. Heart: RRR, S1, S2, no S3, S4, or murmur; no rub. Abdomen: Soft, non-tender, non-distended with normoactive bowel sounds. No hepatomegaly. No rebound/guarding. No obvious abdominal masses. Extremities: No clubbing, cyanosis, edema. Distal pedal pulses are 2+ bilaterally. R radial cath site stable without bruising or hematoma Neuro: Alert and oriented X 3. Moves all extremities spontaneously. Psych: Normal affect.  Leslie Roberson was seen by Dr. Tamala Julian and determined stable for discharge home. Follow up in the office has been arranged. Medications are listed below.   _____________  Discharge Vitals Blood pressure (!) 138/52, pulse 75, temperature 97.9 F (36.6 C), temperature source Oral, resp. rate 16, height 4\' 10"  (1.473 m), weight 125 lb 10.6 oz (57 kg), SpO2 95 %.  Filed Weights   07/07/17 0551 07/08/17 0532  Weight: 125 lb (56.7 kg) 125 lb 10.6 oz (57 kg)    Labs & Radiologic Studies    CBC Recent Labs    07/08/17 0311  WBC 6.6  HGB 12.5  HCT 39.8  MCV 92.8  PLT 854   Basic Metabolic Panel Recent Labs    07/08/17 0311  NA 141  K 4.0  CL 107  CO2 25  GLUCOSE 110*  BUN 19  CREATININE 0.77  CALCIUM 8.7*   Liver Function Tests No results for input(s): AST, ALT, ALKPHOS, BILITOT, PROT, ALBUMIN in the last 72 hours. No results for  input(s): LIPASE, AMYLASE in the last 72 hours. Cardiac Enzymes No results for input(s): CKTOTAL, CKMB, CKMBINDEX, TROPONINI in the last 72 hours. BNP Invalid input(s): POCBNP D-Dimer No results for input(s): DDIMER in the last 72 hours. Hemoglobin A1C No results for input(s): HGBA1C in the last 72 hours. Fasting Lipid Panel No results for input(s): CHOL, HDL, LDLCALC, TRIG, CHOLHDL, LDLDIRECT in the last 72 hours. Thyroid Function Tests No results for input(s): TSH, T4TOTAL, T3FREE, THYROIDAB in the last 72 hours.  Invalid input(s): FREET3 _____________  Dg Chest 2 View  Result Date: 07/06/2017 CLINICAL DATA:  History of spontaneous right pneumothorax first seen on plain films of the chest 06/25/2017. EXAM: CHEST - 2 VIEW COMPARISON:  PA and lateral chest 06/27/2017 and 06/26/2017. FINDINGS: Right pneumothorax seen on the comparison examinations has resolved. The lungs are clear. Heart size is normal. No pleural effusion. Marked convex right thoracolumbar scoliosis noted. IMPRESSION: Resolved right pneumothorax.  No acute abnormality. Scoliosis. Electronically Signed   By: Inge Rise M.D.   On: 07/06/2017 15:33   Dg Chest 2 View  Result Date: 06/27/2017 CLINICAL DATA:  Shortness of breath and chest pain for 2 days.  Follow-up pneumothorax. EXAM: CHEST - 2 VIEW COMPARISON:  06/26/2017 and prior chest radiographs FINDINGS: A RIGHT apical pneumothorax has decreased in size, now 10-15%. Cardiomediastinal silhouette is unchanged. A severe thoracolumbar scoliosis again noted with mild RIGHT basilar atelectasis. No pleural effusion. IMPRESSION: Slightly decreased RIGHT apical pneumothorax now 10-15%, previously 20%. Electronically Signed   By: Margarette Canada M.D.   On: 06/27/2017 14:49   Dg Chest 2 View  Result Date: 06/26/2017 CLINICAL DATA:  Follow-up of pneumothorax. EXAM: CHEST - 2 VIEW COMPARISON:  06/23/2017 FINDINGS: Moderate convex right thoracic spine curvature, distorting the  appearance of the chest on both views. Numerous leads and wires project over the chest. Patient rotated to the left. Grossly normal heart size and mediastinal contours. Atherosclerosis in the transverse aorta. No pleural fluid. Right-sided pneumothorax again identified. Visceral pleural line 3.0 cm from chest wall today versus 3.2 cm on the prior exam (when remeasured). Subsegmental atelectasis at the left lung base. IMPRESSION: Minimal decrease in approximately 20% right apical pneumothorax. Aortic Atherosclerosis (ICD10-I70.0). Electronically Signed   By: Abigail Miyamoto M.D.   On: 06/26/2017 08:11   Dg Chest 2 View  Result Date: 06/25/2017 CLINICAL DATA:  Chest pain EXAM: CHEST - 2 VIEW COMPARISON:  09/01/2016 FINDINGS: Cardiac shadow is at the upper limits of normal in size. Scoliosis concave to the left is noted stable from the previous exam. Left lung is well aerated. There is evidence of a spontaneous pneumothorax on the right with approximately 3 cm excursion from the apex. No focal infiltrate is seen. No sizable effusion is noted. No acute bony abnormality is seen. IMPRESSION: New spontaneous pneumothorax on the right with approximately 3 cm excursion from the apex. Critical Value/emergent results were called by telephone at the time of interpretation on 06/25/2017 at 4:05 pm to Dr. Zenovia Jarred , who verbally acknowledged these results. Electronically Signed   By: Inez Catalina M.D.   On: 06/25/2017 16:09   Disposition   Pt is being discharged home today in good condition.  Follow-up Plans & Appointments    Follow-up Information    Barrett, Evelene Croon, PA-C Follow up on 07/16/2017.   Specialties:  Cardiology, Radiology Why:  at [facility] for your follow up appt.  Contact information: [address] [place] [address] [phone]          Discharge Instructions    Amb Referral to Cardiac Rehabilitation   Complete by:  As directed    Diagnosis:   PTCA Coronary Stents       Diet - low sodium heart healthy   Complete by:  As directed    Discharge instructions   Complete by:  As directed    Radial Site Care Refer to this sheet in the next few weeks. These instructions provide you with information on caring for yourself after your procedure. Your caregiver may also give you more specific instructions. Your treatment has been planned according to current medical practices, but problems sometimes occur. Call your caregiver if you have any problems or questions after your procedure. HOME CARE INSTRUCTIONS You may shower the day after the procedure.Remove the bandage (dressing) and gently wash the site with plain soap and water.Gently pat the site dry.  Do not apply powder or lotion to the site.  Do not submerge the affected site in water for 3 to 5 days.  Inspect the site at least twice daily.  Do not flex or bend the affected arm for 24 hours.  No lifting  over 5 pounds (2.3 kg) for 5 days after your procedure.  Do not drive home if you are discharged the same day of the procedure. Have someone else drive you.  You may drive 24 hours after the procedure unless otherwise instructed by your caregiver.  What to expect: Any bruising will usually fade within 1 to 2 weeks.  Blood that collects in the tissue (hematoma) may be painful to the touch. It should usually decrease in size and tenderness within 1 to 2 weeks.  SEEK IMMEDIATE MEDICAL CARE IF: You have unusual pain at the radial site.  You have redness, warmth, swelling, or pain at the radial site.  You have drainage (other than a small amount of blood on the dressing).  You have chills.  You have a fever or persistent symptoms for more than 72 hours.  You have a fever and your symptoms suddenly get worse.  Your arm becomes pale, cool, tingly, or numb.  You have heavy bleeding from the site. Hold pressure on the site.   PLEASE DO NOT MISS ANY DOSES OF YOUR PLAVIX!!!!! Also keep a log of you blood pressures and  bring back to your follow up appt. Please call the office with any questions.   Patients taking blood thinners should generally stay away from medicines like ibuprofen, Advil, Motrin, naproxen, and Aleve due to risk of stomach bleeding. You may take Tylenol as directed or talk to your primary doctor about alternatives.   Increase activity slowly   Complete by:  As directed       Discharge Medications     Medication List    STOP taking these medications   dexlansoprazole 60 MG capsule Commonly known as:  DEXILANT   meloxicam 15 MG tablet Commonly known as:  MOBIC   naproxen sodium 220 MG tablet Commonly known as:  ALEVE     TAKE these medications   albuterol 108 (90 Base) MCG/ACT inhaler Commonly known as:  PROVENTIL HFA;VENTOLIN HFA Inhale 1-2 puffs into the lungs every 4 (four) hours as needed for wheezing or shortness of breath.   aspirin EC 81 MG tablet Take 81 mg by mouth at bedtime.   atorvastatin 80 MG tablet Commonly known as:  LIPITOR Take 1 tablet (80 mg total) by mouth daily.   busPIRone 30 MG tablet Commonly known as:  BUSPAR TAKE 1 TABLET BY MOUTH TWICE A DAY What changed:    how much to take  how to take this  when to take this   calcium-vitamin D 500-200 MG-UNIT tablet Commonly known as:  OSCAL WITH D Take 1 tablet by mouth daily.   clobetasol 0.05 % external solution Commonly known as:  TEMOVATE Apply 1 application topically 2 (two) times daily. What changed:    when to take this  reasons to take this   clonazePAM 0.5 MG tablet Commonly known as:  KLONOPIN Take 1 tablet (0.5 mg total) by mouth daily as needed for anxiety. What changed:  when to take this   clopidogrel 75 MG tablet Commonly known as:  PLAVIX Take 1 tablet (75 mg total) by mouth daily with breakfast.   denosumab 60 MG/ML Soln injection Commonly known as:  PROLIA Inject 60 mg into the skin every 6 (six) months. Administer in upper arm, thigh, or abdomen     desvenlafaxine 100 MG 24 hr tablet Commonly known as:  PRISTIQ TAKE 1 TABLET BY MOUTH EVERY DAY What changed:    how much to take  how to  take this  when to take this   desvenlafaxine 50 MG 24 hr tablet Commonly known as:  PRISTIQ Take 50 mg by mouth at bedtime What changed:  Another medication with the same name was changed. Make sure you understand how and when to take each.   diclofenac sodium 1 % Gel Commonly known as:  VOLTAREN APPLY 4G TOPICALLY 4 TIMES DAILY What changed:    how much to take  how to take this  when to take this  reasons to take this  additional instructions   escitalopram 10 MG tablet Commonly known as:  LEXAPRO Take 2 tablets (20 mg total) by mouth at bedtime.   FISH OIL PO Take 1 capsule by mouth daily.   flunisolide 29 MCG/ACT nasal spray Commonly known as:  NASAREL Place 2 sprays into the nose daily as needed for rhinitis.   gabapentin 300 MG capsule Commonly known as:  NEURONTIN TAKE 1 CAP AT BEDTIME X1 WEEK,1 CAP TWICE DAILY X1 WEEK THEN 1 CAP 3X DAILY MAY DOUBLE WEEKLY=3600MG  What changed:  See the new instructions.   hydrOXYzine 25 MG tablet Commonly known as:  ATARAX/VISTARIL Take 1 tablet (25 mg total) by mouth 3 (three) times daily as needed. For chest tightness and/or scalp itching What changed:    when to take this  additional instructions   ipratropium 0.06 % nasal spray Commonly known as:  ATROVENT Place 2 sprays into both nostrils 4 (four) times daily.   levothyroxine 125 MCG tablet Commonly known as:  SYNTHROID, LEVOTHROID TAKE ONE-HALF TABLET BY MOUTH ONCE DAILY What changed:    how much to take  how to take this  when to take this   lidocaine 5 % Commonly known as:  LIDODERM Place 1 patch onto the skin daily. Remove & Discard patch within 12 hours or as directed by MD What changed:    when to take this  reasons to take this  additional instructions   metoprolol tartrate 25 MG  tablet Commonly known as:  LOPRESSOR Take 0.5 tablets (12.5 mg total) by mouth 2 (two) times daily.   MINERAL ICE EX Apply 1 application to affected sites one to two times a day as needed (for pain)   nitroGLYCERIN 0.4 MG SL tablet Commonly known as:  NITROSTAT Place 1 tablet (0.4 mg total) under the tongue every 5 (five) minutes as needed.   nortriptyline 25 MG capsule Commonly known as:  PAMELOR TAKE 1 CAPSULE (25 MG TOTAL) BY MOUTH AT BEDTIME   pantoprazole 40 MG tablet Commonly known as:  PROTONIX Take 1 tablet (40 mg total) by mouth daily. What changed:  when to take this   traZODone 50 MG tablet Commonly known as:  DESYREL TAKE 1 TABLET BY MOUTH EVERYDAY AT BEDTIME   triamcinolone ointment 0.1 % Commonly known as:  KENALOG Apply 1 application topically 2 (two) times daily. To affected area(s) as needed. Use no longer than 1-2 weeks.        Aspirin prescribed at discharge?  Yes High Intensity Statin Prescribed? (Lipitor 40-80mg  or Crestor 20-40mg ): Yes Beta Blocker Prescribed? Yes For EF <40%, was ACEI/ARB Prescribed? No: EF ok ADP Receptor Inhibitor Prescribed? (i.e. Plavix etc.-Includes Medically Managed Patients): Yes For EF <40%, Aldosterone Inhibitor Prescribed? No: EF ok Was EF assessed during THIS hospitalization? Yes Was Cardiac Rehab II ordered? (Included Medically managed Patients): Yes   Outstanding Labs/Studies   FLP/LFTs in 6 weeks.   Duration of Discharge Encounter   Greater than 30 minutes  including physician time.  Signed, Reino Bellis NP-C 07/08/2017, 11:00 AM

## 2017-07-09 ENCOUNTER — Telehealth (HOSPITAL_COMMUNITY): Payer: Self-pay

## 2017-07-09 ENCOUNTER — Ambulatory Visit (INDEPENDENT_AMBULATORY_CARE_PROVIDER_SITE_OTHER): Payer: BLUE CROSS/BLUE SHIELD | Admitting: Rehabilitative and Restorative Service Providers"

## 2017-07-09 DIAGNOSIS — M545 Low back pain, unspecified: Secondary | ICD-10-CM

## 2017-07-09 DIAGNOSIS — R29898 Other symptoms and signs involving the musculoskeletal system: Secondary | ICD-10-CM

## 2017-07-09 DIAGNOSIS — R531 Weakness: Secondary | ICD-10-CM | POA: Diagnosis not present

## 2017-07-09 NOTE — Therapy (Signed)
[place], [place], [address] Phone: [phone]   Fax:  [phone]  Physical Therapy Treatment  Patient Details  Name: Leslie Roberson MRN: [id number] Date of Birth: 1956-03-02 Referring Provider: Dr Lynne Leader    Encounter Date: 07/09/2017  PT End of Session - 07/09/17 1304    Visit Number  7    Number of Visits  12    Date for PT Re-Evaluation  07/29/17    PT Start Time  1102    PT Stop Time  1158    PT Time Calculation (min)  56 min    Activity Tolerance  Patient tolerated treatment well       Past Medical History:  Diagnosis Date  . Anxiety   . Arthritis    "right little finger; base of thumb left hand; spine" (07/07/2017)  . Asthma, chronic 11/27/2015  . Chronic back pain   . Coronary artery disease    4/19 PCI/DEx2 to LAD, and PCI/DES x1 to RCA, normal EF  . Depression   . Diverticulitis of colon 12/21/2015   Colonoscopy every 5 years/digestive health.   . GERD (gastroesophageal reflux disease) 11/27/2015  . History of blood transfusion    "related to spinal fusions"  . Hypothyroidism   . Idiopathic scoliosis 01/22/2016  . Osteoporosis 11/27/2015   On prolia.   . Pneumonia 2016  . Pneumothorax 06/26/2017  . Pre-diabetes   . Thyroid disease     Past Surgical History:  Procedure Laterality Date  . ABDOMINAL HYSTERECTOMY    . bladder mesh     S/P bladder sling"  . CORONARY STENT INTERVENTION N/A 07/07/2017   Procedure: CORONARY STENT INTERVENTION;  Surgeon: Leonie Man, MD;  Location: [place] [facility];  Service: Cardiovascular;  Laterality: N/A;  . ELBOW SURGERY Right    "dislocation"  . INCONTINENCE SURGERY     "didn't work"  . INTRAVASCULAR PRESSURE WIRE/FFR STUDY N/A 07/07/2017   Procedure: INTRAVASCULAR PRESSURE WIRE/FFR STUDY;  Surgeon: Leonie Man, MD;  Location: [place] [facility];  Service: Cardiovascular;  Laterality: N/A;  . JOINT REPLACEMENT    . LEFT  HEART CATH AND CORONARY ANGIOGRAPHY N/A 07/07/2017   Procedure: LEFT HEART CATH AND CORONARY ANGIOGRAPHY;  Surgeon: Leonie Man, MD;  Location: [place] [facility];  Service: Cardiovascular;  Laterality: N/A;  . SPINAL FUSION  ~ 1970 X 3   "below neck - lower back"  . TONSILLECTOMY    . TOTAL HIP ARTHROPLASTY Left     There were no vitals filed for this visit.      [facility] PT Assessment - 07/09/17 0001      Assessment   Medical Diagnosis  Rt lumbar pain     Referring Provider  Dr Lynne Leader     Onset Date/Surgical Date  05/31/17    Hand Dominance  Right    Next MD Visit  PRN     Prior Therapy  yes for hip and back pain       AROM   AROM Assessment Site  -- LBP with lateral flexion     Lumbar Flexion  75%    Lumbar Extension  30%    Lumbar - Right Side Bend  5%    Lumbar - Left Side Bend  30%    Lumbar - Right Rotation  20%    Lumbar - Left Rotation  35%      Strength   Overall Strength Comments  grossly 5/5 bilat LE's       Palpation   Palpation comment  muscular tightness thoracolumbar musculature; QL; piriformis; hip flexors Rt > Lt                    [facility] Adult PT Treatment/Exercise - 07/09/17 0001      Lumbar Exercises: Stretches   Double Knee to Chest Stretch  3 reps;30 seconds    Piriformis Stretch  Right;3 reps;30 seconds supine travell    Other Lumbar Stretch Exercise  sitting with lateral trunk flexion over bolster for 3 reps of 30 sec hold     Other Lumbar Stretch Exercise  pec stretch 3 positions 30 sec x 2 reps each       Moist Heat Therapy   Number Minutes Moist Heat  20 Minutes    Moist Heat Location  Lumbar Spine      Electrical Stimulation   Electrical Stimulation Location  Rt lumbar/SI area to Rt upper trap/scapular area      Electrical Stimulation Action  IFC    Electrical Stimulation Parameters  to tolerance    Electrical Stimulation Goals  Pain;Tone      Ultrasound   Ultrasound Location  Rt lumbar/QL/lats at crest of pelvis;  Rt upper trap/leveator     Ultrasound Parameters  1.5 w/cm2; 63mHz; 100%; 8 min     Ultrasound Goals  Pain;Other (Comment) tightness       Manual Therapy   Manual therapy comments  pt prone     Soft tissue mobilization  soft tissue work Rt lumbar/QL/SI area into the Rt scapular/upper trap area      Myofascial Release  Rt lumbar/sacral area                   PT Long Term Goals - 07/09/17 1310      PT LONG TERM GOAL #1   Title  independent with HEP (07/29/17)    Time  6    Period  Weeks    Status  On-going      PT LONG TERM GOAL #2   Title  improve lumbar spine mobility by 10-20% throughout  (07/29/17)    Time  6    Period  Weeks    Status  On-going      PT LONG TERM GOAL #3   Title  report ability to walk > 15 min without increase in pain for improved activity tolerance and decreased pain (07/29/17)    Time  6    Period  Weeks    Status  On-going      PT LONG TERM GOAL #4   Title  improve FOTO to </= 42% limitation (07/29/17)    Time  6    Period  Weeks    Status  On-going      PT LONG TERM GOAL #5   Title  decrease pain in Rt hip by 50-75% allowing patient to return to normal functional activitiy level (07/29/17)    Time  6    Period  Weeks    Status  On-going            Plan - 07/09/17 1305    Clinical Impression Statement  Patient demonstrates improved trunk mobility and ROM. She has continued tightness to palpation through the lumbar and upper trap/shoulder girdle area but pain is decreased today. Patient has had some interuption of PT visits related to medical problems which may have slowed her progress. Patient wil benefit  from continued treatment to further reduce musculoskeletal pain and dysfunction and progress with independent home program.     Rehab Potential  Good    PT Frequency  2x / week    PT Duration  6 weeks    PT Treatment/Interventions  Patient/family education;ADLs/Self Care Home Management;Cryotherapy;Electrical Stimulation;Iontophoresis  4mg /ml Dexamethasone;Ultrasound;Dry needling;Manual techniques;Neuromuscular re-education;Therapeutic activities;Therapeutic exercise    PT Next Visit Plan  continue DN for lumbar spine; manual work; ther exercise; assess response to [place]; modalities as indicated     Consulted and Agree with Plan of Care  Patient       Patient will benefit from skilled therapeutic intervention in order to improve the following deficits and impairments:  Postural dysfunction, Improper body mechanics, Pain, Increased fascial restricitons, Increased muscle spasms, Hypomobility, Decreased mobility, Decreased range of motion, Decreased activity tolerance  Visit Diagnosis: Acute right-sided low back pain without sciatica  Other symptoms and signs involving the musculoskeletal system  Weakness generalized     Problem List Patient Active Problem List   Diagnosis Date Noted  . Angina, class III ([place]) 07/07/2017  . Coronary artery disease involving native coronary artery of native heart with angina pectoris ([place])   . Pneumothorax 06/25/2017  . Atypical chest pain 05/24/2017  . Itchy scalp 05/24/2017  . Right leg pain 02/06/2017  . Chronic tension-type headache, intractable 02/05/2017  . Prediabetes 02/05/2017  . Lump in throat 01/27/2017  . Dysphagia 01/27/2017  . No energy 09/15/2016  . Bad taste in mouth 07/26/2016  . Paresthesia 04/03/2016  . Facet hypertrophy of lumbosacral region 04/03/2016  . Chronic back pain 04/03/2016  . Right knee pain 02/19/2016  . Rosacea 02/04/2016  . Vaginal atrophy 01/31/2016  . Idiopathic scoliosis 01/22/2016  . Hip bursitis 12/25/2015  . Biceps tendonitis on right 12/25/2015  . Diverticulitis of colon 12/21/2015  . GERD (gastroesophageal reflux disease) 11/27/2015  . Osteoporosis 11/27/2015  . GAD (generalized anxiety disorder) 11/27/2015  . MDD (major depressive disorder), recurrent, in full remission ([place]) 11/27/2015  . Chronic dryness of both eyes 11/27/2015  .  Hypothyroidism 11/27/2015  . Asthma, chronic 11/27/2015  . Insomnia 11/27/2015  . Seborrheic keratoses 11/27/2015    Leslie Roberson PT, MPH  07/09/2017, 1:11 PM  [facility] [place], [place], [address] Phone: [phone]   Fax:  [phone]  Name: Leslie Roberson MRN: [id number] Date of Birth: 1955-11-12

## 2017-07-09 NOTE — Telephone Encounter (Signed)
TOC attempt #1, [email or web address]

## 2017-07-09 NOTE — Telephone Encounter (Signed)
Patient contacted regarding discharge from [facility] on 07/08/17.    Patient understands to follow up with provider Rosaria Ferries PA on 07/16/17 at 9 AM at [facility] office.  Patient understands discharge instructions? yes  Patient understands medications and regiment? yes  Patient understands to bring all medications to this visit? yes    Patient states she has a very small "bump" at insertion site, no redness, swelling, or pain.   States very minor bruising.  Advised to monitor and to call if anything changes or worsens.   Patient aware and verbalized understanding.

## 2017-07-09 NOTE — Telephone Encounter (Signed)
Called patient to see if she is interested in the Cardiac Rehab Program - Patient stated she would like to attend Cardiac Rehab at [facility] as location is closer. Closed referral.

## 2017-07-10 ENCOUNTER — Telehealth (HOSPITAL_COMMUNITY): Payer: Self-pay

## 2017-07-10 ENCOUNTER — Other Ambulatory Visit (HOSPITAL_COMMUNITY): Payer: Self-pay

## 2017-07-10 MED ORDER — ESCITALOPRAM OXALATE 20 MG PO TABS: 20.0000 mg | ORAL_TABLET | Freq: Every day | ORAL | 1 refills | Status: DC

## 2017-07-10 NOTE — Telephone Encounter (Signed)
[facility] sent fax requesting per insurance company that we change Lexapro 10 2 tabs daily to 20mg  tabs. Changed Lexapro to 1 20mg  tab per day. Nothing further is needed at this time.

## 2017-07-12 ENCOUNTER — Other Ambulatory Visit: Payer: Self-pay | Admitting: Physician Assistant

## 2017-07-15 ENCOUNTER — Ambulatory Visit (INDEPENDENT_AMBULATORY_CARE_PROVIDER_SITE_OTHER): Payer: BLUE CROSS/BLUE SHIELD | Admitting: Rehabilitative and Restorative Service Providers"

## 2017-07-15 ENCOUNTER — Encounter: Payer: Self-pay | Admitting: Rehabilitative and Restorative Service Providers"

## 2017-07-15 DIAGNOSIS — R29898 Other symptoms and signs involving the musculoskeletal system: Secondary | ICD-10-CM | POA: Diagnosis not present

## 2017-07-15 DIAGNOSIS — M545 Low back pain, unspecified: Secondary | ICD-10-CM

## 2017-07-15 DIAGNOSIS — R531 Weakness: Secondary | ICD-10-CM | POA: Diagnosis not present

## 2017-07-15 DIAGNOSIS — M25551 Pain in right hip: Secondary | ICD-10-CM

## 2017-07-15 NOTE — Therapy (Signed)
[place], [place], [address] Phone: [phone]   Fax:  [phone]  Physical Therapy Treatment  Patient Details  Name: Leslie Roberson MRN: [id number] Date of Birth: 04-Dec-1955 Referring Provider: Dr Lynne Leader    Encounter Date: 07/15/2017  PT End of Session - 07/15/17 1401    Visit Number  8    Number of Visits  12    Date for PT Re-Evaluation  07/29/17    PT Start Time  1401    PT Stop Time  1500    PT Time Calculation (min)  59 min    Activity Tolerance  Patient tolerated treatment well       Past Medical History:  Diagnosis Date  . Anxiety   . Arthritis    "right little finger; base of thumb left hand; spine" (07/07/2017)  . Asthma, chronic 11/27/2015  . Chronic back pain   . Coronary artery disease    4/19 PCI/DEx2 to LAD, and PCI/DES x1 to RCA, normal EF  . Depression   . Diverticulitis of colon 12/21/2015   Colonoscopy every 5 years/digestive health.   . GERD (gastroesophageal reflux disease) 11/27/2015  . History of blood transfusion    "related to spinal fusions"  . Hypothyroidism   . Idiopathic scoliosis 01/22/2016  . Osteoporosis 11/27/2015   On prolia.   . Pneumonia 2016  . Pneumothorax 06/26/2017  . Pre-diabetes   . Thyroid disease     Past Surgical History:  Procedure Laterality Date  . ABDOMINAL HYSTERECTOMY    . bladder mesh     S/P bladder sling"  . CORONARY STENT INTERVENTION N/A 07/07/2017   Procedure: CORONARY STENT INTERVENTION;  Surgeon: Leonie Man, MD;  Location: [place] [facility];  Service: Cardiovascular;  Laterality: N/A;  . ELBOW SURGERY Right    "dislocation"  . INCONTINENCE SURGERY     "didn't work"  . INTRAVASCULAR PRESSURE WIRE/FFR STUDY N/A 07/07/2017   Procedure: INTRAVASCULAR PRESSURE WIRE/FFR STUDY;  Surgeon: Leonie Man, MD;  Location: [place] [facility];  Service: Cardiovascular;  Laterality: N/A;  . JOINT REPLACEMENT    . LEFT  HEART CATH AND CORONARY ANGIOGRAPHY N/A 07/07/2017   Procedure: LEFT HEART CATH AND CORONARY ANGIOGRAPHY;  Surgeon: Leonie Man, MD;  Location: [place] [facility];  Service: Cardiovascular;  Laterality: N/A;  . SPINAL FUSION  ~ 1970 X 3   "below neck - lower back"  . TONSILLECTOMY    . TOTAL HIP ARTHROPLASTY Left     There were no vitals filed for this visit.  Subjective Assessment - 07/15/17 1401    Subjective  Patient reports that she is feeling "pretty good" today. She returns to the cardiologist tomorrow. Patient reports that she has not been using the pillow between her knees at night and that seems to have helped some.     Currently in Pain?  Yes    Pain Score  4     Pain Location  Back    Pain Orientation  Right    Pain Descriptors / Indicators  Aching;Tightness    Pain Type  Chronic pain    Pain Onset  More than a month ago    Pain Frequency  Constant         [facility] PT Assessment - 07/15/17 0001      Assessment   Medical Diagnosis  Rt lumbar pain     Referring Provider  Dr Lynne Leader  Onset Date/Surgical Date  05/31/17    Hand Dominance  Right    Next MD Visit  PRN     Prior Therapy  yes for hip and back pain       Flexibility   Hamstrings  WFL    Quadriceps  WFL    ITB  WFL    Piriformis  WFL      Palpation   Palpation comment  persistent muscular tightness thoracolumbar musculature; QL; piriformis; hip flexors Rt > Lt                    [facility] Adult PT Treatment/Exercise - 07/15/17 0001      Lumbar Exercises: Stretches   Double Knee to Chest Stretch  3 reps;30 seconds    Piriformis Stretch  Right;3 reps;30 seconds supine travell    Other Lumbar Stretch Exercise  sitting with lateral trunk flexion over bolster for 3 reps of 30 sec hold     Other Lumbar Stretch Exercise  pec stretch 3 positions 30 sec x 2 reps each       Moist Heat Therapy   Number Minutes Moist Heat  20 Minutes    Moist Heat Location  Lumbar Spine      Electrical  Stimulation   Electrical Stimulation Location  Rt lumbar/SI area to Rt upper trap/scapular area      Electrical Stimulation Action  IFC    Electrical Stimulation Parameters  to tolerance    Electrical Stimulation Goals  Pain;Tone      Ultrasound   Ultrasound Location  Rt lumbar/ crest of the pelvis/lats area; Rt upper trap/leveator     Ultrasound Parameters  1.5 w/cm2; 1 mHz; 100%; 7 min each area    Ultrasound Goals  Pain;Other (Comment) tightness       Manual Therapy   Manual therapy comments  pt prone     Soft tissue mobilization  soft tissue work Rt lumbar/QL/SI area into the Rt scapular/upper trap area      Myofascial Release  Rt lumbar/sacral area                   PT Long Term Goals - 07/15/17 1409      PT LONG TERM GOAL #1   Title  independent with HEP (07/29/17)    Time  6    Period  Weeks    Status  On-going      PT LONG TERM GOAL #2   Title  improve lumbar spine mobility by 10-20% throughout  (07/29/17)    Time  6    Period  Weeks    Status  On-going      PT LONG TERM GOAL #3   Title  report ability to walk > 15 min without increase in pain for improved activity tolerance and decreased pain (07/29/17)    Time  6    Period  Weeks    Status  On-going      PT LONG TERM GOAL #4   Title  improve FOTO to </= 42% limitation (07/29/17)    Time  6    Period  Weeks    Status  On-going      PT LONG TERM GOAL #5   Title  decrease pain in Rt hip by 50-75% allowing patient to return to normal functional activitiy level (07/29/17)    Time  6    Period  Weeks    Status  On-going  Plan - 07/15/17 1459    Clinical Impression Statement  Improvement in soft tissue extensibility with report of decrease in pain. Patient continues to work on stretching and core strengthening at home. Too much exercise increases the pain in the low back. Gradually progressing toward stated goals of therapy with patient' activity level increasing.     Rehab Potential  Good     PT Frequency  2x / week    PT Duration  6 weeks    PT Treatment/Interventions  Patient/family education;ADLs/Self Care Home Management;Cryotherapy;Electrical Stimulation;Iontophoresis 4mg /ml Dexamethasone;Ultrasound;Dry needling;Manual techniques;Neuromuscular re-education;Therapeutic activities;Therapeutic exercise    PT Next Visit Plan  continue DN for lumbar spine as indicated; manual work; ther exercise; [place]; modalities as indicated     Consulted and Agree with Plan of Care  Patient       Patient will benefit from skilled therapeutic intervention in order to improve the following deficits and impairments:  Postural dysfunction, Improper body mechanics, Pain, Increased fascial restricitons, Increased muscle spasms, Hypomobility, Decreased mobility, Decreased range of motion, Decreased activity tolerance  Visit Diagnosis: Acute right-sided low back pain without sciatica  Other symptoms and signs involving the musculoskeletal system  Weakness generalized  Pain of right hip joint     Problem List Patient Active Problem List   Diagnosis Date Noted  . Angina, class III ([place]) 07/07/2017  . Coronary artery disease involving native coronary artery of native heart with angina pectoris ([place])   . Pneumothorax 06/25/2017  . Atypical chest pain 05/24/2017  . Itchy scalp 05/24/2017  . Right leg pain 02/06/2017  . Chronic tension-type headache, intractable 02/05/2017  . Prediabetes 02/05/2017  . Lump in throat 01/27/2017  . Dysphagia 01/27/2017  . No energy 09/15/2016  . Bad taste in mouth 07/26/2016  . Paresthesia 04/03/2016  . Facet hypertrophy of lumbosacral region 04/03/2016  . Chronic back pain 04/03/2016  . Right knee pain 02/19/2016  . Rosacea 02/04/2016  . Vaginal atrophy 01/31/2016  . Idiopathic scoliosis 01/22/2016  . Hip bursitis 12/25/2015  . Biceps tendonitis on right 12/25/2015  . Diverticulitis of colon 12/21/2015  . GERD (gastroesophageal reflux disease) 11/27/2015   . Osteoporosis 11/27/2015  . GAD (generalized anxiety disorder) 11/27/2015  . MDD (major depressive disorder), recurrent, in full remission ([place]) 11/27/2015  . Chronic dryness of both eyes 11/27/2015  . Hypothyroidism 11/27/2015  . Asthma, chronic 11/27/2015  . Insomnia 11/27/2015  . Seborrheic keratoses 11/27/2015    Loretta Kluender Nilda Simmer PT, MPH 07/15/2017, 3:02 PM  [facility] [place], [place], [address] Phone: [phone]   Fax:  [phone]  Name: Leslie Roberson MRN: [id number] Date of Birth: January 26, 1956

## 2017-07-16 ENCOUNTER — Encounter: Payer: Self-pay | Admitting: Physician Assistant

## 2017-07-16 ENCOUNTER — Ambulatory Visit (HOSPITAL_COMMUNITY): Payer: Self-pay | Admitting: Licensed Clinical Social Worker

## 2017-07-16 ENCOUNTER — Ambulatory Visit (INDEPENDENT_AMBULATORY_CARE_PROVIDER_SITE_OTHER): Payer: BLUE CROSS/BLUE SHIELD | Admitting: Rehabilitative and Restorative Service Providers"

## 2017-07-16 ENCOUNTER — Ambulatory Visit: Payer: BLUE CROSS/BLUE SHIELD | Admitting: Physician Assistant

## 2017-07-16 ENCOUNTER — Encounter: Payer: Self-pay | Admitting: Rehabilitative and Restorative Service Providers"

## 2017-07-16 VITALS — BP 82/50 | HR 63 | Ht <= 58 in | Wt 123.0 lb

## 2017-07-16 DIAGNOSIS — M545 Low back pain, unspecified: Secondary | ICD-10-CM

## 2017-07-16 DIAGNOSIS — R531 Weakness: Secondary | ICD-10-CM

## 2017-07-16 DIAGNOSIS — I2510 Atherosclerotic heart disease of native coronary artery without angina pectoris: Secondary | ICD-10-CM | POA: Diagnosis not present

## 2017-07-16 DIAGNOSIS — E785 Hyperlipidemia, unspecified: Secondary | ICD-10-CM | POA: Diagnosis not present

## 2017-07-16 DIAGNOSIS — R29898 Other symptoms and signs involving the musculoskeletal system: Secondary | ICD-10-CM

## 2017-07-16 MED ORDER — METOPROLOL SUCCINATE ER 25 MG PO TB24: 25.0000 mg | ORAL_TABLET | Freq: Every day | ORAL | 3 refills | Status: DC

## 2017-07-16 NOTE — Patient Instructions (Addendum)
Medication Instructions:  STOP- Metoprolol Tartrate START- Metoprolol Succinate 25 mg 1 tablet daily  If you need a refill on your cardiac medications before your next appointment, please call your pharmacy.  Labwork: CMP and Fasting Lipids done at PCP Office  Testing/Procedures: None Ordered  Follow-Up: Your physician wants you to follow-up in: 3 Months with Dr Stanford Breed.     Thank you for choosing [facility] at [facility]!!

## 2017-07-16 NOTE — Progress Notes (Signed)
Cardiology Office Note   Date:  07/16/2017   ID:  Krista Godsil Holstein, DOB 21-Apr-1955, MRN [id number]  PCP:  Donella Stade, PA-C  Cardiologist: Dr. Stanford Breed 06/24/2017 Rosaria Ferries, PA-C 07/02/2017  Chief Complaint  Patient presents with  . Follow-up    TOC. Patient says that her right arm where she had the procedure done is sore.    History of Present Illness: Ronneisha Jett Madrid is a 62 y.o. female with a history of asthma, GERD, pre-DM, hypothyroid, CP w/ poorexercise tolerance on ETT, PTX 06/27/2017 2nd needling  07/02/2017 office visit, symptoms concerning for CAD.  Patient referred for Cath 4/09 w/ DES LAD & RCA  Dakota presents for cardiology follow up.  Since she got out of the hospital, no chest pain or SOB.   She is going to do cardiac rehab, is scheduled.   She will be visiting family in [place] soon, will pick up cardiac rehab when she comes back.   Her BP runs low all the time. At home, SBP has been 100s-110s. Once it was 76/56.  She felt a little weak then, but is generally not symptomatic with the systolic blood pressure in the 90s-100s.  She is compliant with the aspirin and Plavix, no doses missed.  No side effects from any of the cardiac drugs.  She had soreness in her right antecubital area today when washing her hair, she has an area of ecchymosis from either an IV or blood draw.  Her right wrist where they did the cath is a little bit sore but not bad.   Past Medical History:  Diagnosis Date  . Anxiety   . Arthritis    "right little finger; base of thumb left hand; spine" (07/07/2017)  . Asthma, chronic 11/27/2015  . Chronic back pain   . Coronary artery disease    4/19 PCI/DEx2 to LAD, and PCI/DES x1 to RCA, normal EF  . Depression   . Diverticulitis of colon 12/21/2015   Colonoscopy every 5 years/digestive health.   . GERD (gastroesophageal reflux disease) 11/27/2015  . History of blood transfusion    "related to spinal fusions"  .  Hypothyroidism   . Idiopathic scoliosis 01/22/2016  . Osteoporosis 11/27/2015   On prolia.   . Pneumonia 2016  . Pneumothorax 06/26/2017  . Pre-diabetes   . Thyroid disease     Past Surgical History:  Procedure Laterality Date  . ABDOMINAL HYSTERECTOMY    . bladder mesh     S/P bladder sling"  . CORONARY STENT INTERVENTION N/A 07/07/2017   Procedure: CORONARY STENT INTERVENTION;  Surgeon: Leonie Man, MD;  Location: [facility];  Service: Cardiovascular;  Laterality: N/A;  . ELBOW SURGERY Right    "dislocation"  . INCONTINENCE SURGERY     "didn't work"  . INTRAVASCULAR PRESSURE WIRE/FFR STUDY N/A 07/07/2017   Procedure: INTRAVASCULAR PRESSURE WIRE/FFR STUDY;  Surgeon: Leonie Man, MD;  Location: [facility];  Service: Cardiovascular;  Laterality: N/A;  . JOINT REPLACEMENT    . LEFT HEART CATH AND CORONARY ANGIOGRAPHY N/A 07/07/2017   Procedure: LEFT HEART CATH AND CORONARY ANGIOGRAPHY;  Surgeon: Leonie Man, MD;  Location: [facility];  Service: Cardiovascular;  Laterality: N/A;  . SPINAL FUSION  ~ 1970 X 3   "below neck - lower back"  . TONSILLECTOMY    . TOTAL HIP ARTHROPLASTY Left     Current Outpatient Medications  Medication Sig Dispense Refill  .  albuterol (PROVENTIL HFA;VENTOLIN HFA) 108 (90 Base) MCG/ACT inhaler Inhale 1-2 puffs into the lungs every 4 (four) hours as needed for wheezing or shortness of breath. 1 Inhaler 3  . aspirin EC 81 MG tablet Take 81 mg by mouth at bedtime.    Marland Kitchen atorvastatin (LIPITOR) 80 MG tablet Take 1 tablet (80 mg total) by mouth daily. 30 tablet 1  . busPIRone (BUSPAR) 30 MG tablet TAKE 1 TABLET BY MOUTH TWICE A DAY (Patient taking differently: Take 30 mg by mouth two times a day) 60 tablet 3  . calcium-vitamin D (OSCAL WITH D) 500-200 MG-UNIT tablet Take 1 tablet by mouth daily.    . clonazePAM (KLONOPIN) 0.5 MG tablet Take 1 tablet (0.5 mg total) by mouth daily as needed for anxiety. (Patient taking differently:  Take 0.5 mg by mouth at bedtime. ) 30 tablet 1  . clopidogrel (PLAVIX) 75 MG tablet Take 1 tablet (75 mg total) by mouth daily with breakfast. 90 tablet 2  . denosumab (PROLIA) 60 MG/ML SOLN injection Inject 60 mg into the skin every 6 (six) months. Administer in upper arm, thigh, or abdomen    . desvenlafaxine (PRISTIQ) 100 MG 24 hr tablet TAKE 1 TABLET BY MOUTH EVERY DAY (Patient taking differently: Take 100 mg by mouth at bedtime) 30 tablet 4  . desvenlafaxine (PRISTIQ) 50 MG 24 hr tablet Take 50 mg by mouth at bedtime 90 tablet 1  . diclofenac sodium (VOLTAREN) 1 % GEL APPLY 4G TOPICALLY 4 TIMES DAILY (Patient taking differently: Apply 4 g topically 4 (four) times daily as needed (to affected painful sites). ) 100 g 12  . escitalopram (LEXAPRO) 20 MG tablet Take 1 tablet (20 mg total) by mouth at bedtime. 30 tablet 1  . gabapentin (NEURONTIN) 300 MG capsule TAKE 1 CAP AT BEDTIME X1 WEEK,1 CAP TWICE DAILY X1 WEEK THEN 1 CAP 3X DAILY MAY DOUBLE WEEKLY=3600MG  (Patient taking differently: Take 300 mg by mouth in the morning and 600 mg at bedtime) 180 capsule 3  . hydrOXYzine (ATARAX/VISTARIL) 25 MG tablet Take 1 tablet (25 mg total) by mouth 3 (three) times daily as needed. For chest tightness and/or scalp itching (Patient taking differently: Take 25 mg by mouth at bedtime. ) 30 tablet 1  . levothyroxine (SYNTHROID, LEVOTHROID) 125 MCG tablet TAKE ONE-HALF TABLET BY MOUTH ONCE DAILY (Patient taking differently: Take 62.5 mcg by mouth once a day) 30 tablet 1  . lidocaine (LIDODERM) 5 % Place 1 patch onto the skin daily. Remove & Discard patch within 12 hours or as directed by MD (Patient taking differently: Place 1 patch onto the skin daily as needed (for pain). Remove & Discard patch within 12 hours or as directed by MD) 30 patch 12  . Menthol, Topical Analgesic, (MINERAL ICE EX) Apply 1 application to affected sites one to two times a day as needed (for pain)    . metoprolol tartrate (LOPRESSOR) 25 MG  tablet Take 0.5 tablets (12.5 mg total) by mouth 2 (two) times daily. 60 tablet 1  . nitroGLYCERIN (NITROSTAT) 0.4 MG SL tablet Place 1 tablet (0.4 mg total) under the tongue every 5 (five) minutes as needed. 25 tablet 2  . Omega-3 Fatty Acids (FISH OIL PO) Take 1 capsule by mouth daily.     . pantoprazole (PROTONIX) 40 MG tablet Take 1 tablet (40 mg total) by mouth daily. (Patient taking differently: Take 40 mg by mouth every morning. ) 90 tablet 1  . ranitidine (ZANTAC) 150 MG tablet Take  150 mg by mouth at bedtime.    . traZODone (DESYREL) 50 MG tablet TAKE 1 TABLET BY MOUTH EVERYDAY AT BEDTIME 90 tablet 1   No current facility-administered medications for this visit.     Allergies:   Hydromorphone; Levofloxacin; Meperidine; Broccoli [brassica oleracea italica]; and Adhesive [tape]    Social History:  The patient  reports that she has never smoked. She has never used smokeless tobacco. She reports that she drinks alcohol. She reports that she does not use drugs.   Family History:  The patient's family history includes COPD in her paternal aunt; Cancer in her mother; Depression in her mother; Diabetes in her maternal aunt; Heart attack in her father; Hyperlipidemia in her father and paternal aunt; Hypertension in her father.    ROS:  Please see the history of present illness. All other systems are reviewed and negative.    PHYSICAL EXAM: VS:  BP (!) 82/50 (BP Location: Left Arm, Patient Position: Sitting, Cuff Size: Normal)   Pulse 63   Ht 4\' 10"  (1.473 m)   Wt 123 lb (55.8 kg)   BMI 25.71 kg/m  , BMI Body mass index is 25.71 kg/m. GEN: Well nourished, well developed, female in no acute distress  HEENT: normal for age  Neck: no JVD, no carotid bruit, no masses Cardiac: RRR; no murmur, no rubs, or gallops Respiratory:  clear to auscultation bilaterally, normal work of breathing GI: soft, nontender, nondistended, + BS MS: no deformity or atrophy; no edema; distal pulses are 2+ in  all 4 extremities   Skin: warm and dry, no rash; several areas of ecchymosis from IV sticks and blood draws, resolving Neuro:  Strength and sensation are intact Psych: euthymic mood, full affect   EKG:  EKG is not ordered today.   CATH:   Culprit Lesion #1: Mid RCA to Dist RCA lesion is 99% stenosed.  A drug-eluting stent was successfully placed using a STENT SYNERGY DES 3X16. - Post-dilated to 3.3 mm  Post intervention, there is a 0% residual stenosis.  Ost LAD lesion is 30% stenosed. Mid LAD lesion is 30% stenosed. (before & after stent)  Prox LAD-1 lesion is 75% stenosed. Prox LAD-2 lesion is 60% stenosed. -- FFR 0.73  A drug-eluting stent was successfully placed using a STENT SYNERGY DES 2.5X28. - Post-dilated to 2.8 mm  Post intervention, there is a 0% residual stenosis.  The left ventricular systolic function is normal. The left ventricular ejection fraction is 55-65% by visual estimate.  LV end diastolic pressure is mildly elevated.  There is no aortic valve stenosis.  Severe 2 Vessel CAD - m-dRCA 99%, p-mLAD 75% & 60% (FFR 0.73) -- Successful 2 Vessel DES PCI  Plan: Overnight observation post PCI. Zephyr band removal per protocol DAPT For minimum of 6 months uninterrupted, at that point, would be okay to temporarily hold for procedures if necessary, but preferably 1 year uninterrupted Continue risk factor modification with lipid and glycemic management along with blood pressure control. Post-Intervention Diagram          Recent Labs: 02/03/2017: ALT 38; TSH 1.67 07/08/2017: BUN 19; Creatinine, Ser 0.77; Hemoglobin 12.5; Platelets 199; Potassium 4.0; Sodium 141    Lipid Panel    Component Value Date/Time   CHOL 185 02/03/2017 1017   TRIG 90 02/03/2017 1017   HDL 60 02/03/2017 1017   CHOLHDL 3.1 02/03/2017 1017   VLDL 29 12/28/2015 1056   LDLCALC 107 (H) 02/03/2017 1017     Wt Readings from  Last 3 Encounters:  07/16/17 123 lb (55.8 kg)  07/08/17 125 lb  10.6 oz (57 kg)  07/06/17 124 lb (56.2 kg)     Other studies Reviewed: Additional studies/ records that were reviewed today include: Office notes, hospital records and testing.  ASSESSMENT AND PLAN:  1.  CAD: s/p DES to the LAD and RCA.  Tolerating aspirin and Plavix well.  Also continue metoprolol at current dose and high-dose Lipitor.  Continue to increase activity gradually and reports symptoms.  2.  Hyperlipidemia: I explained that her goal LDL is now less than 70, continue high-dose statin and recheck in 3 months.  3.  Ecchymosis in the right antecubital pain: I explained that she could have some superficial phlebitis because the IV sticks and blood draws.  However, the symptoms should improve.  It is okay to use heat on any sore areas as it is been more than 48 hours since the procedures.  It is okay to use over-the-counter creams such as Aspercreme if it will make it feel better.  Call us back if symptoms do not improve.   Current medicines are reviewed at length with the patient today.  The patient does not have concerns regarding medicines.  The following changes have been made:  no change  Labs/ tests ordered today include:   Orders Placed This Encounter  Procedures  . Comprehensive Metabolic Panel (CMET)  . Lipid panel     Disposition:   FU with Dr. Stanford Breed  Signed, Rosaria Ferries, PA-C  07/16/2017 2:40 PM    [place] Phone: [phone]; Fax: [phone]  This note was written with the assistance of speech recognition software. Please excuse any transcriptional errors.

## 2017-07-16 NOTE — Therapy (Signed)
[place], [place], [address] Phone: [phone]   Fax:  [phone]  Physical Therapy Treatment  Patient Details  Name: Leslie Roberson MRN: [id number] Date of Birth: 1955/11/25 Referring Provider: Dr Lynne Leader    Encounter Date: 07/16/2017  PT End of Session - 07/16/17 1557    Visit Number  9    Number of Visits  12    Date for PT Re-Evaluation  07/29/17    PT Start Time  2202    PT Stop Time  1628    PT Time Calculation (min)  55 min    Activity Tolerance  Patient tolerated treatment well       Past Medical History:  Diagnosis Date  . Anxiety   . Arthritis    "right little finger; base of thumb left hand; spine" (07/07/2017)  . Asthma, chronic 11/27/2015  . Chronic back pain   . Coronary artery disease    4/19 PCI/DEx2 to LAD, and PCI/DES x1 to RCA, normal EF  . Depression   . Diverticulitis of colon 12/21/2015   Colonoscopy every 5 years/digestive health.   . GERD (gastroesophageal reflux disease) 11/27/2015  . History of blood transfusion    "related to spinal fusions"  . Hypothyroidism   . Idiopathic scoliosis 01/22/2016  . Osteoporosis 11/27/2015   On prolia.   . Pneumonia 2016  . Pneumothorax 06/26/2017  . Pre-diabetes   . Thyroid disease     Past Surgical History:  Procedure Laterality Date  . ABDOMINAL HYSTERECTOMY    . bladder mesh     S/P bladder sling"  . CORONARY STENT INTERVENTION N/A 07/07/2017   Procedure: CORONARY STENT INTERVENTION;  Surgeon: Leonie Man, MD;  Location: [facility];  Service: Cardiovascular;  Laterality: N/A;  . ELBOW SURGERY Right    "dislocation"  . INCONTINENCE SURGERY     "didn't work"  . INTRAVASCULAR PRESSURE WIRE/FFR STUDY N/A 07/07/2017   Procedure: INTRAVASCULAR PRESSURE WIRE/FFR STUDY;  Surgeon: Leonie Man, MD;  Location: [facility];  Service: Cardiovascular;  Laterality: N/A;  . JOINT REPLACEMENT    . LEFT  HEART CATH AND CORONARY ANGIOGRAPHY N/A 07/07/2017   Procedure: LEFT HEART CATH AND CORONARY ANGIOGRAPHY;  Surgeon: Leonie Man, MD;  Location: [facility];  Service: Cardiovascular;  Laterality: N/A;  . SPINAL FUSION  ~ 1970 X 3   "below neck - lower back"  . TONSILLECTOMY    . TOTAL HIP ARTHROPLASTY Left     There were no vitals filed for this visit.  Subjective Assessment - 07/16/17 1557    Subjective  LBP is improving - still some tightness along the Rt lumbar and thoracic paraspinals into the Rt shoulder     Currently in Pain?  Yes    Pain Score  3     Pain Location  Back    Pain Orientation  Right    Pain Descriptors / Indicators  Aching;Tightness                       [facility] Adult PT Treatment/Exercise - 07/16/17 0001      Lumbar Exercises: Stretches   Passive Hamstring Stretch  Right;Left;2 reps;30 seconds supine with strap     Double Knee to Chest Stretch  3 reps;30 seconds    Piriformis Stretch  Right;3 reps;30 seconds supine travell    Other Lumbar Stretch Exercise  sitting with lateral trunk  flexion over bolster for 3 reps of 30 sec hold       Lumbar Exercises: Supine   Other Supine Lumbar Exercises  3 part core 10 sec x 10       Moist Heat Therapy   Number Minutes Moist Heat  20 Minutes    Moist Heat Location  Lumbar Spine      Electrical Stimulation   Electrical Stimulation Location  Rt lumbar/SI area to Rt upper trap/scapular area      Electrical Stimulation Action  IFC    Electrical Stimulation Parameters  to tolerance    Electrical Stimulation Goals  Pain;Tone      Ultrasound   Ultrasound Location  Rt lumbar/crest of the illum; Rt upper trap     Ultrasound Parameters  1.5 w/cm2; 1 mHz; 100%; 10 min total     Ultrasound Goals  Pain;Other (Comment) tightness       Manual Therapy   Manual therapy comments  pt prone     Soft tissue mobilization  soft tissue work Rt lumbar/QL/SI area into the Rt scapular/upper trap area       Myofascial Release  Rt lumbar/sacral area                   PT Long Term Goals - 07/15/17 1409      PT LONG TERM GOAL #1   Title  independent with HEP (07/29/17)    Time  6    Period  Weeks    Status  On-going      PT LONG TERM GOAL #2   Title  improve lumbar spine mobility by 10-20% throughout  (07/29/17)    Time  6    Period  Weeks    Status  On-going      PT LONG TERM GOAL #3   Title  report ability to walk > 15 min without increase in pain for improved activity tolerance and decreased pain (07/29/17)    Time  6    Period  Weeks    Status  On-going      PT LONG TERM GOAL #4   Title  improve FOTO to </= 42% limitation (07/29/17)    Time  6    Period  Weeks    Status  On-going      PT LONG TERM GOAL #5   Title  decrease pain in Rt hip by 50-75% allowing patient to return to normal functional activitiy level (07/29/17)    Time  6    Period  Weeks    Status  On-going            Plan - 07/16/17 1621    Clinical Impression Statement  Gradual improvemet with decreased palpable tightness noted through the Rt spinal musculature - low back along thoracic spine into the posterior shoulder girdle.     Rehab Potential  Good    PT Frequency  2x / week    PT Duration  6 weeks    PT Treatment/Interventions  Patient/family education;ADLs/Self Care Home Management;Cryotherapy;Electrical Stimulation;Iontophoresis 4mg /ml Dexamethasone;Ultrasound;Dry needling;Manual techniques;Neuromuscular re-education;Therapeutic activities;Therapeutic exercise    PT Next Visit Plan  continue DN for lumbar spine as indicated; manual work; ther exercise; [place]; modalities as indicated     Consulted and Agree with Plan of Care  Patient       Patient will benefit from skilled therapeutic intervention in order to improve the following deficits and impairments:  Postural dysfunction, Improper body mechanics, Pain, Increased fascial restricitons, Increased muscle  spasms, Hypomobility, Decreased  mobility, Decreased range of motion, Decreased activity tolerance  Visit Diagnosis: Acute right-sided low back pain without sciatica  Other symptoms and signs involving the musculoskeletal system  Weakness generalized     Problem List Patient Active Problem List   Diagnosis Date Noted  . Angina, class III ([place]) 07/07/2017  . Coronary artery disease involving native coronary artery of native heart with angina pectoris ([place])   . Pneumothorax 06/25/2017  . Atypical chest pain 05/24/2017  . Itchy scalp 05/24/2017  . Right leg pain 02/06/2017  . Chronic tension-type headache, intractable 02/05/2017  . Prediabetes 02/05/2017  . Lump in throat 01/27/2017  . Dysphagia 01/27/2017  . No energy 09/15/2016  . Bad taste in mouth 07/26/2016  . Paresthesia 04/03/2016  . Facet hypertrophy of lumbosacral region 04/03/2016  . Chronic back pain 04/03/2016  . Right knee pain 02/19/2016  . Rosacea 02/04/2016  . Vaginal atrophy 01/31/2016  . Idiopathic scoliosis 01/22/2016  . Hip bursitis 12/25/2015  . Biceps tendonitis on right 12/25/2015  . Diverticulitis of colon 12/21/2015  . GERD (gastroesophageal reflux disease) 11/27/2015  . Osteoporosis 11/27/2015  . GAD (generalized anxiety disorder) 11/27/2015  . MDD (major depressive disorder), recurrent, in full remission ([place]) 11/27/2015  . Chronic dryness of both eyes 11/27/2015  . Hypothyroidism 11/27/2015  . Asthma, chronic 11/27/2015  . Insomnia 11/27/2015  . Seborrheic keratoses 11/27/2015    Everardo All PT MPH  07/16/2017, 4:23 PM  [facility] [place], [place], [address] Phone: [phone]   Fax:  [phone]  Name: Leslie Roberson MRN: [id number] Date of Birth: 12-Sep-1955

## 2017-07-19 ENCOUNTER — Other Ambulatory Visit: Payer: Self-pay | Admitting: Physician Assistant

## 2017-07-19 DIAGNOSIS — M7061 Trochanteric bursitis, right hip: Secondary | ICD-10-CM

## 2017-07-20 ENCOUNTER — Encounter: Payer: Self-pay | Admitting: Rehabilitative and Restorative Service Providers"

## 2017-07-20 ENCOUNTER — Ambulatory Visit: Payer: BLUE CROSS/BLUE SHIELD | Admitting: Rehabilitative and Restorative Service Providers"

## 2017-07-20 DIAGNOSIS — R29898 Other symptoms and signs involving the musculoskeletal system: Secondary | ICD-10-CM | POA: Diagnosis not present

## 2017-07-20 DIAGNOSIS — M545 Low back pain, unspecified: Secondary | ICD-10-CM

## 2017-07-20 DIAGNOSIS — R531 Weakness: Secondary | ICD-10-CM

## 2017-07-20 NOTE — Therapy (Signed)
[place], [place], [address] Phone: [phone]   Fax:  [phone]  Physical Therapy Treatment  Patient Details  Name: Leslie Roberson MRN: [id number] Date of Birth: Oct 24, 1955 Referring Provider: Dr Lynne Leader    Encounter Date: 07/20/2017  PT End of Session - 07/20/17 0934    Visit Number  10    Number of Visits  12    Date for PT Re-Evaluation  07/29/17    PT Start Time  0932    PT Stop Time  1028    PT Time Calculation (min)  56 min       Past Medical History:  Diagnosis Date  . Anxiety   . Arthritis    "right little finger; base of thumb left hand; spine" (07/07/2017)  . Asthma, chronic 11/27/2015  . Chronic back pain   . Coronary artery disease    4/19 PCI/DEx2 to LAD, and PCI/DES x1 to RCA, normal EF  . Depression   . Diverticulitis of colon 12/21/2015   Colonoscopy every 5 years/digestive health.   . GERD (gastroesophageal reflux disease) 11/27/2015  . History of blood transfusion    "related to spinal fusions"  . Hypothyroidism   . Idiopathic scoliosis 01/22/2016  . Osteoporosis 11/27/2015   On prolia.   . Pneumonia 2016  . Pneumothorax 06/26/2017  . Pre-diabetes   . Thyroid disease     Past Surgical History:  Procedure Laterality Date  . ABDOMINAL HYSTERECTOMY    . bladder mesh     S/P bladder sling"  . CORONARY STENT INTERVENTION N/A 07/07/2017   Procedure: CORONARY STENT INTERVENTION;  Surgeon: Leonie Man, MD;  Location: [facility];  Service: Cardiovascular;  Laterality: N/A;  . ELBOW SURGERY Right    "dislocation"  . INCONTINENCE SURGERY     "didn't work"  . INTRAVASCULAR PRESSURE WIRE/FFR STUDY N/A 07/07/2017   Procedure: INTRAVASCULAR PRESSURE WIRE/FFR STUDY;  Surgeon: Leonie Man, MD;  Location: [facility];  Service: Cardiovascular;  Laterality: N/A;  . JOINT REPLACEMENT    . LEFT HEART CATH AND CORONARY ANGIOGRAPHY N/A 07/07/2017   Procedure: LEFT HEART CATH AND CORONARY ANGIOGRAPHY;  Surgeon: Leonie Man, MD;  Location: [facility];  Service: Cardiovascular;  Laterality: N/A;  . SPINAL FUSION  ~ 1970 X 3   "below neck - lower back"  . TONSILLECTOMY    . TOTAL HIP ARTHROPLASTY Left     There were no vitals filed for this visit.  Subjective Assessment - 07/20/17 0943    Subjective  LBP is improving - still some tightness along the Rt lumbar and thoracic paraspinals into the Rt shoulder which is the worst pain     Currently in Pain?  Yes    Pain Score  3     Pain Location  Back    Pain Orientation  Right    Pain Descriptors / Indicators  Aching;Tightness    Pain Type  Chronic pain    Pain Onset  More than a month ago                       [facility] Adult PT Treatment/Exercise - 07/20/17 0001      Lumbar Exercises: Stretches   Passive Hamstring Stretch  Right;Left;2 reps;30 seconds supine with strap     Double Knee to Chest Stretch  3 reps;30 seconds    Piriformis Stretch  Right;3 reps;30 seconds supine  travell      Shoulder Exercises: Standing   Other Standing Exercises  axial extension 10 sec x 3; lateral cervical flexion from chin tuck position 10-15 sec hold x 3 - repeated with slight rotation to Lt then Rt; reach for the floor 10 sec x 5       Moist Heat Therapy   Number Minutes Moist Heat  20 Minutes    Moist Heat Location  Lumbar Spine      Electrical Stimulation   Electrical Stimulation Location  Rt lumbar/SI area to Rt upper trap/scapular area      Electrical Stimulation Action  IFC    Electrical Stimulation Parameters  to tolerane    Electrical Stimulation Goals  Pain;Tone      Ultrasound   Ultrasound Location  Rt lumbar/crest of the illum/crest of pelvis; Rt upper trap/leveator     Ultrasound Parameters  1.5 w/cm2, 1 mHz; 100%; 10 min     Ultrasound Goals  Pain;Other (Comment) tightness       Manual Therapy   Manual therapy comments  pt prone     Soft tissue  mobilization  soft tissue work Rt lumbar/QL/SI area into the Rt scapular/upper trap area      Myofascial Release  Rt lumbar/sacral area        Trigger Point Dry Needling - 07/20/17 1233    Consent Given?  Yes    Muscles Treated Lower Body  -- Rt     Upper Trapezius Response  Palpable increased muscle length    Levator Scapulae Response  Palpable increased muscle length                PT Long Term Goals - 07/20/17 1226      PT LONG TERM GOAL #1   Title  independent with HEP (07/29/17)    Time  6    Period  Weeks    Status  On-going      PT LONG TERM GOAL #2   Title  improve lumbar spine mobility by 10-20% throughout  (07/29/17)    Time  6    Period  Weeks    Status  On-going      PT LONG TERM GOAL #3   Title  report ability to walk > 15 min without increase in pain for improved activity tolerance and decreased pain (07/29/17)    Time  6    Period  Weeks    Status  Partially Met      PT LONG TERM GOAL #4   Title  improve FOTO to </= 42% limitation (07/29/17)    Time  6    Period  Weeks    Status  On-going      PT LONG TERM GOAL #5   Title  decrease pain in thoracic and lumbar pain 50-75% allowing patient to return to normal functional activitiy level (07/29/17)    Time  6    Period  Weeks    Status  On-going              Patient will benefit from skilled therapeutic intervention in order to improve the following deficits and impairments:     Visit Diagnosis: Acute right-sided low back pain without sciatica  Other symptoms and signs involving the musculoskeletal system  Weakness generalized     Problem List Patient Active Problem List   Diagnosis Date Noted  . Angina, class III ([place]) 07/07/2017  . Coronary artery disease involving native coronary artery of native heart  with angina pectoris ([place])   . Pneumothorax 06/25/2017  . Atypical chest pain 05/24/2017  . Itchy scalp 05/24/2017  . Right leg pain 02/06/2017  . Chronic tension-type headache,  intractable 02/05/2017  . Prediabetes 02/05/2017  . Lump in throat 01/27/2017  . Dysphagia 01/27/2017  . No energy 09/15/2016  . Bad taste in mouth 07/26/2016  . Paresthesia 04/03/2016  . Facet hypertrophy of lumbosacral region 04/03/2016  . Chronic back pain 04/03/2016  . Right knee pain 02/19/2016  . Rosacea 02/04/2016  . Vaginal atrophy 01/31/2016  . Idiopathic scoliosis 01/22/2016  . Hip bursitis 12/25/2015  . Biceps tendonitis on right 12/25/2015  . Diverticulitis of colon 12/21/2015  . GERD (gastroesophageal reflux disease) 11/27/2015  . Osteoporosis 11/27/2015  . GAD (generalized anxiety disorder) 11/27/2015  . MDD (major depressive disorder), recurrent, in full remission ([place]) 11/27/2015  . Chronic dryness of both eyes 11/27/2015  . Hypothyroidism 11/27/2015  . Asthma, chronic 11/27/2015  . Insomnia 11/27/2015  . Seborrheic keratoses 11/27/2015    Gretchen Weinfeld Nilda Simmer PT, MPH  07/20/2017, 12:34 PM  [facility] [place], [place], [address] Phone: [phone]   Fax:  [phone]  Name: Hennesy Sobalvarro MRN: [id number] Date of Birth: September 26, 1955

## 2017-07-21 ENCOUNTER — Ambulatory Visit (HOSPITAL_COMMUNITY): Payer: BLUE CROSS/BLUE SHIELD | Admitting: Licensed Clinical Social Worker

## 2017-07-21 DIAGNOSIS — F411 Generalized anxiety disorder: Secondary | ICD-10-CM

## 2017-07-21 DIAGNOSIS — F331 Major depressive disorder, recurrent, moderate: Secondary | ICD-10-CM

## 2017-07-21 DIAGNOSIS — Z955 Presence of coronary angioplasty implant and graft: Secondary | ICD-10-CM | POA: Diagnosis not present

## 2017-07-21 DIAGNOSIS — Z48812 Encounter for surgical aftercare following surgery on the circulatory system: Secondary | ICD-10-CM | POA: Diagnosis not present

## 2017-07-21 NOTE — Progress Notes (Signed)
   THERAPIST PROGRESS NOTE  Session Time: 1:05pm-1:55pm  Participation Level: Active  Behavioral Response: Casual  Alert  Euthymic  Type of Therapy: Individual Therapy  Treatment Goals addressed: Reduce GAD score to moderate or less, increase confidence in ability to manage anxiety without use of benzos  Interventions: Promoting self-care  Suicidal/Homicidal: Denied both  Therapist Interventions:    Discussed how patient coped with some significant health issues in the past month.  Acknowledged how a feeling of relief can be experienced when you get confirmation that your suspicions of something being physically wrong with you are correct.  Provided positive feedback regarding her commitment to take care of herself by eating healthier and getting more exercise.   Summary: Patient continued to experience chest pain.  She was referred to a cardiologist.  It was determined that she had clogged arteries in her heart.  Ended up having 2 stints placed.  Patient also discovered she had a collapsed lung.  Fortunately this problem resolved on its own.   Acknowledged she had experienced a noticeable increase in symptoms of depression.  Those symptoms have decreased since increasing the dosage of her antidepressant.  Reported her sleep has improved some.          Plan: Return in approximately one month. Treatment plan update is due September 01, 2017.  Diagnosis:  GAD MDD, recurrent, moderate   Armandina Stammer 07/21/2017

## 2017-07-22 ENCOUNTER — Telehealth: Payer: Self-pay | Admitting: Cardiology

## 2017-07-22 NOTE — Telephone Encounter (Signed)
New Message:     Please call,she says she is bruising from the telemetry patch from Cardiac Rehab.She is very concerned about this.

## 2017-07-22 NOTE — Telephone Encounter (Signed)
Spoke with pt, she has bruising from where the electrode was removed after cardiac rehab. She wants to make sure she is on the correct dose of plavix. Reassurance given to the patient she is on correct dosage.

## 2017-07-24 ENCOUNTER — Ambulatory Visit (INDEPENDENT_AMBULATORY_CARE_PROVIDER_SITE_OTHER): Payer: BLUE CROSS/BLUE SHIELD | Admitting: Rehabilitative and Restorative Service Providers"

## 2017-07-24 ENCOUNTER — Encounter: Payer: Self-pay | Admitting: Rehabilitative and Restorative Service Providers"

## 2017-07-24 DIAGNOSIS — M545 Low back pain, unspecified: Secondary | ICD-10-CM

## 2017-07-24 DIAGNOSIS — R531 Weakness: Secondary | ICD-10-CM | POA: Diagnosis not present

## 2017-07-24 DIAGNOSIS — R29898 Other symptoms and signs involving the musculoskeletal system: Secondary | ICD-10-CM

## 2017-07-24 NOTE — Therapy (Addendum)
[place], [place], [address] Phone: [phone]   Fax:  [phone]  Physical Therapy Treatment  Patient Details  Name: Leslie Roberson MRN: [id number] Date of Birth: 1955/12/03 Referring Provider: Dr Lynne Leader    Encounter Date: 07/24/2017  PT End of Session - 07/24/17 1106    Visit Number  11    Number of Visits  12    Date for PT Re-Evaluation  07/29/17    PT Start Time  1058    PT Stop Time  1206    PT Time Calculation (min)  68 min    Activity Tolerance  Patient tolerated treatment well       Past Medical History:  Diagnosis Date  . Anxiety   . Arthritis    "right little finger; base of thumb left hand; spine" (07/07/2017)  . Asthma, chronic 11/27/2015  . Chronic back pain   . Coronary artery disease    4/19 PCI/DEx2 to LAD, and PCI/DES x1 to RCA, normal EF  . Depression   . Diverticulitis of colon 12/21/2015   Colonoscopy every 5 years/digestive health.   . GERD (gastroesophageal reflux disease) 11/27/2015  . History of blood transfusion    "related to spinal fusions"  . Hypothyroidism   . Idiopathic scoliosis 01/22/2016  . Osteoporosis 11/27/2015   On prolia.   . Pneumonia 2016  . Pneumothorax 06/26/2017  . Pre-diabetes   . Thyroid disease     Past Surgical History:  Procedure Laterality Date  . ABDOMINAL HYSTERECTOMY    . bladder mesh     S/P bladder sling"  . CORONARY STENT INTERVENTION N/A 07/07/2017   Procedure: CORONARY STENT INTERVENTION;  Surgeon: Leonie Man, MD;  Location: [facility];  Service: Cardiovascular;  Laterality: N/A;  . ELBOW SURGERY Right    "dislocation"  . INCONTINENCE SURGERY     "didn't work"  . INTRAVASCULAR PRESSURE WIRE/FFR STUDY N/A 07/07/2017   Procedure: INTRAVASCULAR PRESSURE WIRE/FFR STUDY;  Surgeon: Leonie Man, MD;  Location: [facility];  Service: Cardiovascular;  Laterality: N/A;  . JOINT REPLACEMENT    . LEFT  HEART CATH AND CORONARY ANGIOGRAPHY N/A 07/07/2017   Procedure: LEFT HEART CATH AND CORONARY ANGIOGRAPHY;  Surgeon: Leonie Man, MD;  Location: [facility];  Service: Cardiovascular;  Laterality: N/A;  . SPINAL FUSION  ~ 1970 X 3   "below neck - lower back"  . TONSILLECTOMY    . TOTAL HIP ARTHROPLASTY Left     There were no vitals filed for this visit.  Subjective Assessment - 07/24/17 1107    Subjective  LBP is improving - still some tightness along the Rt lumbar and thoracic paraspinals into the Rt shoulder which is the worst pain. Doing more at home and having less pain. At least 80-85% improved overall. Still having tightness through the upper trap and shoudler girdle area.     Currently in Pain?  Yes    Pain Score  3     Pain Location  Back    Pain Orientation  Right    Pain Descriptors / Indicators  Aching;Discomfort    Pain Type  Chronic pain         [facility] PT Assessment - 07/24/17 0001      Assessment   Medical Diagnosis  Rt lumbar pain     Referring Provider  Dr Lynne Leader     Onset Date/Surgical Date  05/31/17  Hand Dominance  Right    Next MD Visit  PRN     Prior Therapy  yes for hip and back pain       AROM   Lumbar Flexion  75%    Lumbar Extension  30%    Lumbar - Right Side Bend  10%    Lumbar - Left Side Bend  30%    Lumbar - Right Rotation  20%    Lumbar - Left Rotation  35%      Strength   Overall Strength Comments  grossly 5/5 bilat LE's       Palpation   Palpation comment  decreasing muscular tightness thoracolumbar musculature; QL; piriformis; hip flexors Rt > Lt                    [facility] Adult PT Treatment/Exercise - 07/24/17 0001      Lumbar Exercises: Stretches   Passive Hamstring Stretch  Right;Left;2 reps;30 seconds supine with strap     Double Knee to Chest Stretch  3 reps;30 seconds    Piriformis Stretch  Right;3 reps;30 seconds supine travell    Other Lumbar Stretch Exercise  sitting with lateral trunk flexion over  bolster for 3 reps of 30 sec hold     Other Lumbar Stretch Exercise  pec stretch 3 positions 30 sec x 2 reps each       Lumbar Exercises: Supine   Bridge  10 reps    Other Supine Lumbar Exercises  3 part core 10 sec x 10       Shoulder Exercises: Standing   Other Standing Exercises  axial extension 10 sec x 3; lateral cervical flexion from chin tuck position 10-15 sec hold x 3 - repeated with slight rotation to Lt then Rt; reach for the floor 10 sec x 5       Moist Heat Therapy   Number Minutes Moist Heat  20 Minutes    Moist Heat Location  Lumbar Spine      Electrical Stimulation   Electrical Stimulation Location  Rt lumbar/SI area to Rt upper trap/scapular area      Electrical Stimulation Action  IFC    Electrical Stimulation Parameters  to tolerance    Electrical Stimulation Goals  Pain;Tone      Ultrasound   Ultrasound Location  Rt lumbar/crest of pelvis; Rt upper trap/leveator    Ultrasound Parameters  1.5 w/cm2; 1 mHz; 100%; 10 min     Ultrasound Goals  Pain;Other (Comment) tightness       Manual Therapy   Manual therapy comments  pt prone     Soft tissue mobilization  soft tissue work Rt lumbar/QL/SI area into the Rt scapular/upper trap area      Myofascial Release  Rt lumbar/sacral area     Passive ROM  passsive stretch through cervical spine/upper trap to address spinal tightness                   PT Long Term Goals - 07/24/17 1107      PT LONG TERM GOAL #1   Title  independent with HEP (07/29/17)    Time  6    Period  Weeks    Status  On-going      PT LONG TERM GOAL #2   Title  improve lumbar spine mobility by 10-20% throughout  (07/29/17)    Time  6    Period  Weeks    Status  Partially Met  PT LONG TERM GOAL #3   Title  report ability to walk > 15 min without increase in pain for improved activity tolerance and decreased pain (07/29/17)    Time  6    Period  Weeks    Status  Partially Met      PT LONG TERM GOAL #4   Title  improve FOTO to </=  42% limitation (07/29/17)    Time  6    Period  Weeks    Status  On-going      PT LONG TERM GOAL #5   Title  decrease pain in thoracic and lumbar pain 50-75% allowing patient to return to normal functional activitiy level (07/29/17)    Time  6    Period  Weeks    Status  Achieved            Plan - 07/24/17 1154    Clinical Impression Statement  Continued improvement with part of rehab goals accomplished. Patient has less pain and improved mobility. Functional activities have improved. Patient is resting better at night. Progressing well toward remaining goals of therapy. Patient will be out of town visiting daughter and her family for the next 1-2 weeks. Wil schedule additional appointments upon her return as needed.     Rehab Potential  Good    PT Frequency  2x / week    PT Duration  6 weeks    PT Treatment/Interventions  Patient/family education;ADLs/Self Care Home Management;Cryotherapy;Electrical Stimulation;Iontophoresis 37m/ml Dexamethasone;Ultrasound;Dry needling;Manual techniques;Neuromuscular re-education;Therapeutic activities;Therapeutic exercise    PT Next Visit Plan  continue DN for lumbar spine as indicated; manual work; ther exercise; [place] modalities - patient out of town 1-2 weeks - will call to schedule as needed     Consulted and Agree with Plan of Care  Patient       Patient will benefit from skilled therapeutic intervention in order to improve the following deficits and impairments:  Postural dysfunction, Improper body mechanics, Pain, Increased fascial restricitons, Increased muscle spasms, Hypomobility, Decreased mobility, Decreased range of motion, Decreased activity tolerance  Visit Diagnosis: Acute right-sided low back pain without sciatica  Other symptoms and signs involving the musculoskeletal system  Weakness generalized     Problem List Patient Active Problem List   Diagnosis Date Noted  . Angina, class III ([place] 07/07/2017  . Coronary artery  disease involving native coronary artery of native heart with angina pectoris ([place]   . Pneumothorax 06/25/2017  . Atypical chest pain 05/24/2017  . Itchy scalp 05/24/2017  . Right leg pain 02/06/2017  . Chronic tension-type headache, intractable 02/05/2017  . Prediabetes 02/05/2017  . Lump in throat 01/27/2017  . Dysphagia 01/27/2017  . No energy 09/15/2016  . Bad taste in mouth 07/26/2016  . Paresthesia 04/03/2016  . Facet hypertrophy of lumbosacral region 04/03/2016  . Chronic back pain 04/03/2016  . Right knee pain 02/19/2016  . Rosacea 02/04/2016  . Vaginal atrophy 01/31/2016  . Idiopathic scoliosis 01/22/2016  . Hip bursitis 12/25/2015  . Biceps tendonitis on right 12/25/2015  . Diverticulitis of colon 12/21/2015  . GERD (gastroesophageal reflux disease) 11/27/2015  . Osteoporosis 11/27/2015  . GAD (generalized anxiety disorder) 11/27/2015  . MDD (major depressive disorder), recurrent, in full remission ([place] 11/27/2015  . Chronic dryness of both eyes 11/27/2015  . Hypothyroidism 11/27/2015  . Asthma, chronic 11/27/2015  . Insomnia 11/27/2015  . Seborrheic keratoses 11/27/2015    Marrah Vanevery PNilda SimmerPT, MPH  07/24/2017, 11:59 AM  [place] [phone]  [place], [place], [address] Phone: [phone]   Fax:  [phone]  Name: Leslie Roberson MRN: [id number] Date of Birth: 11/30/1955  PHYSICAL THERAPY DISCHARGE SUMMARY  Visits from Start of Care: 11  Current functional level related to goals / functional outcomes: See last progress note for discharge status   Remaining deficits: Unknown    Education / Equipment: HEP Plan: Patient agrees to discharge.  Patient goals were partially met. Patient is being discharged due to being pleased with the current functional level.  ?????    Venkat Ankney P. Helene Kelp PT, MPH 09/07/17 2:04 PM

## 2017-08-01 ENCOUNTER — Other Ambulatory Visit: Payer: Self-pay | Admitting: Physician Assistant

## 2017-08-01 ENCOUNTER — Other Ambulatory Visit (HOSPITAL_COMMUNITY): Payer: Self-pay | Admitting: Psychiatry

## 2017-08-01 DIAGNOSIS — M7061 Trochanteric bursitis, right hip: Secondary | ICD-10-CM

## 2017-08-04 ENCOUNTER — Other Ambulatory Visit: Payer: Self-pay | Admitting: Physician Assistant

## 2017-08-05 ENCOUNTER — Ambulatory Visit (HOSPITAL_COMMUNITY): Payer: Self-pay | Admitting: Psychiatry

## 2017-08-12 ENCOUNTER — Ambulatory Visit (INDEPENDENT_AMBULATORY_CARE_PROVIDER_SITE_OTHER): Payer: BLUE CROSS/BLUE SHIELD | Admitting: Psychiatry

## 2017-08-12 ENCOUNTER — Encounter (HOSPITAL_COMMUNITY): Payer: Self-pay | Admitting: Psychiatry

## 2017-08-12 VITALS — BP 110/76 | HR 70 | Ht <= 58 in | Wt 120.0 lb

## 2017-08-12 DIAGNOSIS — F411 Generalized anxiety disorder: Secondary | ICD-10-CM | POA: Diagnosis not present

## 2017-08-12 DIAGNOSIS — F5102 Adjustment insomnia: Secondary | ICD-10-CM | POA: Diagnosis not present

## 2017-08-12 DIAGNOSIS — F331 Major depressive disorder, recurrent, moderate: Secondary | ICD-10-CM

## 2017-08-12 DIAGNOSIS — Z9149 Other personal history of psychological trauma, not elsewhere classified: Secondary | ICD-10-CM

## 2017-08-12 DIAGNOSIS — Z62810 Personal history of physical and sexual abuse in childhood: Secondary | ICD-10-CM

## 2017-08-12 DIAGNOSIS — Z818 Family history of other mental and behavioral disorders: Secondary | ICD-10-CM

## 2017-08-12 MED ORDER — CLONAZEPAM 0.5 MG PO TABS: 0.5000 mg | ORAL_TABLET | Freq: Every day | ORAL | 1 refills | Status: DC
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
## 2017-08-13 ENCOUNTER — Encounter: Payer: Self-pay | Admitting: Physician Assistant

## 2017-08-13 ENCOUNTER — Ambulatory Visit: Payer: BLUE CROSS/BLUE SHIELD | Admitting: Physician Assistant

## 2017-08-13 VITALS — BP 120/71 | HR 68 | Ht <= 58 in | Wt 120.0 lb

## 2017-08-13 DIAGNOSIS — Z955 Presence of coronary angioplasty implant and graft: Secondary | ICD-10-CM | POA: Diagnosis not present

## 2017-08-13 DIAGNOSIS — K148 Other diseases of tongue: Secondary | ICD-10-CM

## 2017-08-13 DIAGNOSIS — I25119 Atherosclerotic heart disease of native coronary artery with unspecified angina pectoris: Secondary | ICD-10-CM

## 2017-08-13 DIAGNOSIS — E039 Hypothyroidism, unspecified: Secondary | ICD-10-CM

## 2017-08-13 DIAGNOSIS — R220 Localized swelling, mass and lump, head: Secondary | ICD-10-CM | POA: Diagnosis not present

## 2017-08-13 LAB — TSH: TSH: 4.44 m[IU]/L (ref 0.40–4.50)

## 2017-08-13 NOTE — Progress Notes (Deleted)
   Culprit Lesion #1: Mid RCA to Dist RCA lesion is 99% stenosed.  A drug-eluting stent was successfully placed using a STENT SYNERGY DES 3X16. - Post-dilated to 3.3 mm  Post intervention, there is a 0% residual stenosis.  Ost LAD lesion is 30% stenosed. Mid LAD lesion is 30% stenosed. (before & after stent)  Prox LAD-1 lesion is 75% stenosed. Prox LAD-2 lesion is 60% stenosed. -- FFR 0.73  A drug-eluting stent was successfully placed using a STENT SYNERGY DES 2.5X28. - Post-dilated to 2.8 mm  Post intervention, there is a 0% residual stenosis.  The left ventricular systolic function is normal. The left ventricular ejection fraction is 55-65% by visual estimate.  LV end diastolic pressure is mildly elevated.  There is no aortic valve stenosis.

## 2017-08-13 NOTE — Progress Notes (Signed)
Subjective:    Patient ID: Leslie Roberson, female    DOB: Jun 14, 1955, 62 y.o.   MRN: [id number]  HPI  62 year old pleasant appearing female with CAD, hypothyroidism, GERD presents for evaluation of a bump on her tongue. She first noticed this bump 4 days ago on Sunday. It is located towards the tip of her tongue, slightly to the right of midline. It was tender the first day, but has not been tender since. She has not changed her diet lately. She reports that it has gotten slightly better since Sunday. She has not had an excessive amount of sugary foods. Denies redness to the area. Report after stent placement.    Culprit Lesion #1: Mid RCA to Dist RCA lesion is 99% stenosed.  A drug-eluting stent was successfully placed using a STENT SYNERGY DES 3X16. - Post-dilated to 3.3 mm  Post intervention, there is a 0% residual stenosis.  Ost LAD lesion is 30% stenosed. Mid LAD lesion is 30% stenosed. (before & after stent)  Prox LAD-1 lesion is 75% stenosed. Prox LAD-2 lesion is 60% stenosed. -- FFR 0.73  A drug-eluting stent was successfully placed using a STENT SYNERGY DES 2.5X28. - Post-dilated to 2.8 mm  Post intervention, there is a 0% residual stenosis.  The left ventricular systolic function is normal. The left ventricular ejection fraction is 55-65% by visual estimate.  LV end diastolic pressure is mildly elevated.  There is no aortic valve stenosis.  Marland Kitchen. Active Ambulatory Problems    Diagnosis Date Noted  . GERD (gastroesophageal reflux disease) 11/27/2015  . Osteoporosis 11/27/2015  . GAD (generalized anxiety disorder) 11/27/2015  . MDD (major depressive disorder), recurrent, in full remission ([place]) 11/27/2015  . Chronic dryness of both eyes 11/27/2015  . Hypothyroidism 11/27/2015  . Asthma, chronic 11/27/2015  . Insomnia 11/27/2015  . Seborrheic keratoses 11/27/2015  . Diverticulitis of colon 12/21/2015  . Hip bursitis 12/25/2015  . Biceps tendonitis on right  12/25/2015  . Idiopathic scoliosis 01/22/2016  . Vaginal atrophy 01/31/2016  . Rosacea 02/04/2016  . Right knee pain 02/19/2016  . Paresthesia 04/03/2016  . Facet hypertrophy of lumbosacral region 04/03/2016  . Chronic back pain 04/03/2016  . Bad taste in mouth 07/26/2016  . No energy 09/15/2016  . Lump in throat 01/27/2017  . Dysphagia 01/27/2017  . Chronic tension-type headache, intractable 02/05/2017  . Prediabetes 02/05/2017  . Right leg pain 02/06/2017  . Atypical chest pain 05/24/2017  . Itchy scalp 05/24/2017  . Pneumothorax 06/25/2017  . Angina, class III ([place]) 07/07/2017  . Coronary artery disease involving native coronary artery of native heart with angina pectoris [place])    Resolved Ambulatory Problems    Diagnosis Date Noted  . Acute medial meniscus tear 04/23/2016  . Left knee pain 04/28/2016  . Mild intermittent asthma without complication 90/24/0973   Past Medical History:  Diagnosis Date  . Anxiety   . Arthritis   . Asthma, chronic 11/27/2015  . Chronic back pain   . Coronary artery disease   . Depression   . Diverticulitis of colon 12/21/2015  . GERD (gastroesophageal reflux disease) 11/27/2015  . History of blood transfusion   . Hypothyroidism   . Idiopathic scoliosis 01/22/2016  . Osteoporosis 11/27/2015  . Pneumonia 2016  . Pneumothorax 06/26/2017  . Pre-diabetes   . Thyroid disease     Review of Systems  All other systems reviewed and are negative.      Objective:   Physical Exam  Constitutional: She appears well-developed  and well-nourished.  HENT:  Head: Normocephalic and atraumatic.  No redness or discoloration to the tongue upon inspection. Inspection of the underside of the tongue revealed slight swelling of the right side. Small 0.5 x 0.5 cm round bump on the right side of the tip of the tongue, internally.   Cardiovascular: Normal rate, regular rhythm and normal heart sounds.  Pulmonary/Chest: Effort normal. No respiratory distress.   Skin: Skin is warm and dry.  Psychiatric: She has a normal mood and affect. Her behavior is normal. Thought content normal.  Vitals reviewed.     Assessment & Plan:  Marland KitchenMarland KitchenDiagnoses and all orders for this visit:  Tongue mass  Acquired hypothyroidism -     TSH  Coronary artery disease involving native coronary artery of native heart with angina pectoris ([place])   Suspecting that the bump on her tongue is a salivary stone due to the short duration of symptoms and the fact that it has gotten slightly better with time. Advised pt to eat sour things several times per day, and also to massage the tongue several times per day. Suggested warm compresses or warm water soaks as well. Advised her to follow-up if it is not getting any better within a week, if it is getting bigger, or if there becomes any overlying discoloration or redness. At that time we would consider a referral to ENT for further evaluation.  TSH ordered will adjust medication accordingly.   CAD managed with stent/plavix/statin

## 2017-08-13 NOTE — Patient Instructions (Signed)
Salivary Stone A salivary stone is a mineral deposit that builds up in the ducts that drain your salivary glands. Most salivary gland stones are made of calcium. When a stone forms, saliva can back up into the gland and cause painful swelling. Your salivary glands are the glands that produce spit (saliva). You have six major salivary glands. Each gland has a duct that carries saliva into your mouth. Saliva keeps your mouth moist and breaks down the food that you eat. It also helps to prevent tooth decay. Two salivary glands are located just in front of your ears (parotid). The ducts for these glands open up inside your cheeks, near your back teeth. You also have two glands under your tongue (sublingual) and two glands under your jaw (submandibular). The ducts for these glands open under your tongue. A stone can form in any salivary gland. The most common place for a salivary stone to develop is in a submandibular salivary gland. What are the causes? Any condition that reduces the flow of saliva may lead to stone formation. It is not known why some people form stones and others do not. What increases the risk? You may be more likely to develop a salivary stone if you:  Are female.  Do not drink enough water.  Smoke.  Have high blood pressure.  Have gout.  Have diabetes.  What are the signs or symptoms? The main sign of a salivary gland stone is sudden swelling of a salivary gland when eating. This usually happens under the jaw on one side. Other signs and symptoms include:  Swelling of the cheek or under the tongue when eating.  Pain in the swollen area.  Trouble chewing or swallowing.  Swelling that goes down after eating.  How is this diagnosed? Your health care provider may diagnose a salivary gland stone based on your signs and symptoms. The health care provider will also do a physical exam. In many cases, a stone can be felt in a duct inside your mouth. You may need to see an ear,  nose, and throat specialist (ENT or otolaryngologist) for diagnosis and treatment. You may also need to have diagnostic tests. These may include imaging studies to check for a stone, such as:  X-rays.  Ultrasound.  CT scan.  MRI.  How is this treated? Home care may be enough to treat a small stone that is not causing symptoms. Treatment of a stone that is large enough to cause symptoms may include:  Probing and widening the duct to allow the stone to pass.  Inserting a thin, flexible scope (endoscope) into the duct to locate and remove the stone.  Breaking up the stone with sound waves.  Removing the entire salivary gland.  Follow these instructions at home:  Drink enough fluid to keep your urine clear or pale yellow.  Follow these instructions every few hours: ? Suck on a lemon candy to stimulate the flow of saliva. ? Put a hot compress over the gland. ? Gently massage the gland.  Do not use any tobacco products, including cigarettes, chewing tobacco, or electronic cigarettes. If you need help quitting, ask your health care provider. Contact a health care provider if:  You have pain and swelling in your face, jaw, or mouth after eating.  You have persistent swelling in any of these places: ? In front of your ear. ? Under your jaw. ? Inside your mouth. Get help right away if:  You have pain and swelling in your face,   jaw, or mouth that are getting worse.  Your pain and swelling make it hard to swallow or breathe. This information is not intended to replace advice given to you by your health care provider. Make sure you discuss any questions you have with your health care provider. Document Released: 04/24/2004 Document Revised: 08/23/2015 Document Reviewed: 08/17/2013 Elsevier Interactive Patient Education  2018 Elsevier Inc.  

## 2017-08-14 ENCOUNTER — Encounter: Payer: Self-pay | Admitting: Physician Assistant

## 2017-08-14 DIAGNOSIS — R220 Localized swelling, mass and lump, head: Principal | ICD-10-CM

## 2017-08-14 DIAGNOSIS — K148 Other diseases of tongue: Secondary | ICD-10-CM | POA: Insufficient documentation

## 2017-08-14 NOTE — Progress Notes (Signed)
Call pt: TSH in normal range but increased up upper limitis of normal. Would you like to consider increase?

## 2017-08-17 DIAGNOSIS — Z955 Presence of coronary angioplasty implant and graft: Secondary | ICD-10-CM | POA: Diagnosis not present

## 2017-08-19 DIAGNOSIS — Z955 Presence of coronary angioplasty implant and graft: Secondary | ICD-10-CM | POA: Diagnosis not present

## 2017-08-20 DIAGNOSIS — Z955 Presence of coronary angioplasty implant and graft: Secondary | ICD-10-CM | POA: Diagnosis not present

## 2017-08-22 ENCOUNTER — Other Ambulatory Visit: Payer: Self-pay | Admitting: Cardiology

## 2017-08-25 ENCOUNTER — Ambulatory Visit (INDEPENDENT_AMBULATORY_CARE_PROVIDER_SITE_OTHER): Payer: BLUE CROSS/BLUE SHIELD | Admitting: Licensed Clinical Social Worker

## 2017-08-25 DIAGNOSIS — F424 Excoriation (skin-picking) disorder: Secondary | ICD-10-CM

## 2017-08-25 DIAGNOSIS — F411 Generalized anxiety disorder: Secondary | ICD-10-CM | POA: Diagnosis not present

## 2017-08-25 DIAGNOSIS — F331 Major depressive disorder, recurrent, moderate: Secondary | ICD-10-CM | POA: Diagnosis not present

## 2017-08-25 NOTE — Telephone Encounter (Signed)
Rx sent to pharmacy   
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
## 2017-08-26 ENCOUNTER — Ambulatory Visit: Payer: Self-pay | Admitting: Cardiology

## 2017-08-26 ENCOUNTER — Other Ambulatory Visit (HOSPITAL_COMMUNITY): Payer: Self-pay | Admitting: Psychiatry

## 2017-08-26 DIAGNOSIS — Z955 Presence of coronary angioplasty implant and graft: Secondary | ICD-10-CM | POA: Diagnosis not present

## 2017-08-26 DIAGNOSIS — F411 Generalized anxiety disorder: Secondary | ICD-10-CM

## 2017-08-27 DIAGNOSIS — Z955 Presence of coronary angioplasty implant and graft: Secondary | ICD-10-CM | POA: Diagnosis not present

## 2017-08-28 ENCOUNTER — Telehealth: Payer: Self-pay | Admitting: Cardiology

## 2017-08-28 MED ORDER — ATORVASTATIN CALCIUM 80 MG PO TABS: 80.0000 mg | ORAL_TABLET | Freq: Every day | ORAL | 1 refills | Status: DC

## 2017-08-28 NOTE — Telephone Encounter (Signed)
New message     *STAT* If patient is at the pharmacy, call can be transferred to refill team.   1. Which medications need to be refilled? (please list name of each medication and dose if known)  atorvastatin (LIPITOR) 80 MG tablet TAKE 1 TABLET BY MOUTH EVERY DAY        2. Which pharmacy/location (including street and city if local pharmacy) [facility] [place]  3. Do they need a 30 day or 90 day supply?  90  Prescription was sent to [facility] by mistake

## 2017-08-31 DIAGNOSIS — Z955 Presence of coronary angioplasty implant and graft: Secondary | ICD-10-CM | POA: Diagnosis not present

## 2017-09-01 ENCOUNTER — Other Ambulatory Visit (HOSPITAL_COMMUNITY): Payer: Self-pay | Admitting: Psychiatry

## 2017-09-02 DIAGNOSIS — Z955 Presence of coronary angioplasty implant and graft: Secondary | ICD-10-CM | POA: Diagnosis not present

## 2017-09-03 DIAGNOSIS — Z955 Presence of coronary angioplasty implant and graft: Secondary | ICD-10-CM | POA: Diagnosis not present

## 2017-09-07 DIAGNOSIS — Z955 Presence of coronary angioplasty implant and graft: Secondary | ICD-10-CM | POA: Diagnosis not present

## 2017-09-08 ENCOUNTER — Telehealth: Payer: Self-pay

## 2017-09-08 DIAGNOSIS — I25119 Atherosclerotic heart disease of native coronary artery with unspecified angina pectoris: Secondary | ICD-10-CM

## 2017-09-08 DIAGNOSIS — E039 Hypothyroidism, unspecified: Secondary | ICD-10-CM

## 2017-09-08 DIAGNOSIS — Z131 Encounter for screening for diabetes mellitus: Secondary | ICD-10-CM

## 2017-09-08 DIAGNOSIS — Z13220 Encounter for screening for lipoid disorders: Secondary | ICD-10-CM

## 2017-09-08 DIAGNOSIS — Z0000 Encounter for general adult medical examination without abnormal findings: Secondary | ICD-10-CM

## 2017-09-08 NOTE — Telephone Encounter (Signed)
Ok I ordered that she can get labs done before our visit. Will fax to lab.

## 2017-09-08 NOTE — Telephone Encounter (Signed)
Pt advised orders have been placed and she is to come in prior to appt to get labs drawn.  No further needs at this time

## 2017-09-08 NOTE — Telephone Encounter (Signed)
Patient called and scheduled a follow up to discuss her thyroid and also her sugars with Jade.   Thyroid med was last checked on 08-13-17. Pt wanting to have this rechecked along with her A1C prior to her birthday.   Appt scheduled for 09-15-17. OK to put in orders to recheck thyroid prior to appt? Also, do you want to check A1C at visit or do a blood draw?  Please advise

## 2017-09-09 DIAGNOSIS — Z955 Presence of coronary angioplasty implant and graft: Secondary | ICD-10-CM | POA: Diagnosis not present

## 2017-09-10 DIAGNOSIS — Z955 Presence of coronary angioplasty implant and graft: Secondary | ICD-10-CM | POA: Diagnosis not present

## 2017-09-11 ENCOUNTER — Other Ambulatory Visit: Payer: Self-pay

## 2017-09-11 ENCOUNTER — Encounter (HOSPITAL_COMMUNITY): Payer: Self-pay | Admitting: Psychiatry

## 2017-09-11 ENCOUNTER — Ambulatory Visit (INDEPENDENT_AMBULATORY_CARE_PROVIDER_SITE_OTHER): Payer: BLUE CROSS/BLUE SHIELD | Admitting: Psychiatry

## 2017-09-11 VITALS — BP 92/60 | HR 64 | Ht <= 58 in | Wt 116.0 lb

## 2017-09-11 DIAGNOSIS — F411 Generalized anxiety disorder: Secondary | ICD-10-CM

## 2017-09-11 DIAGNOSIS — Z818 Family history of other mental and behavioral disorders: Secondary | ICD-10-CM

## 2017-09-11 DIAGNOSIS — Z62810 Personal history of physical and sexual abuse in childhood: Secondary | ICD-10-CM | POA: Diagnosis not present

## 2017-09-11 DIAGNOSIS — F331 Major depressive disorder, recurrent, moderate: Secondary | ICD-10-CM

## 2017-09-11 DIAGNOSIS — F5102 Adjustment insomnia: Secondary | ICD-10-CM

## 2017-09-11 DIAGNOSIS — Z79899 Other long term (current) drug therapy: Secondary | ICD-10-CM

## 2017-09-11 DIAGNOSIS — G4700 Insomnia, unspecified: Secondary | ICD-10-CM | POA: Diagnosis not present

## 2017-09-11 DIAGNOSIS — Z131 Encounter for screening for diabetes mellitus: Secondary | ICD-10-CM | POA: Diagnosis not present

## 2017-09-11 DIAGNOSIS — Z0000 Encounter for general adult medical examination without abnormal findings: Secondary | ICD-10-CM | POA: Diagnosis not present

## 2017-09-11 DIAGNOSIS — Z13220 Encounter for screening for lipoid disorders: Secondary | ICD-10-CM | POA: Diagnosis not present
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
## 2017-09-12 LAB — CBC WITH DIFFERENTIAL/PLATELET
BASOS PCT: 0.7 %
Basophils Absolute: 38 cells/uL (ref 0–200)
Eosinophils Absolute: 92 cells/uL (ref 15–500)
Eosinophils Relative: 1.7 %
HCT: 40.7 % (ref 35.0–45.0)
Hemoglobin: 13.5 g/dL (ref 11.7–15.5)
Lymphs Abs: 1269 cells/uL (ref 850–3900)
MCH: 29.5 pg (ref 27.0–33.0)
MCHC: 33.2 g/dL (ref 32.0–36.0)
MCV: 88.9 fL (ref 80.0–100.0)
MONOS PCT: 6.5 %
MPV: 12.3 fL (ref 7.5–12.5)
NEUTROS PCT: 67.6 %
Neutro Abs: 3650 cells/uL (ref 1500–7800)
PLATELETS: 198 10*3/uL (ref 140–400)
RBC: 4.58 10*6/uL (ref 3.80–5.10)
RDW: 13.3 % (ref 11.0–15.0)
TOTAL LYMPHOCYTE: 23.5 %
WBC: 5.4 10*3/uL (ref 3.8–10.8)
WBCMIX: 351 {cells}/uL (ref 200–950)

## 2017-09-12 LAB — LIPID PANEL W/REFLEX DIRECT LDL
CHOL/HDL RATIO: 2 (calc) (ref <5.0)
CHOLESTEROL: 104 mg/dL (ref <200)
HDL: 53 mg/dL (ref >50)
LDL CHOLESTEROL (CALC): 37 mg/dL
NON-HDL CHOLESTEROL (CALC): 51 mg/dL (ref <130)
TRIGLYCERIDES: 68 mg/dL (ref <150)

## 2017-09-12 LAB — COMPLETE METABOLIC PANEL WITH GFR
AG RATIO: 1.7 (calc) (ref 1.0–2.5)
ALKALINE PHOSPHATASE (APISO): 65 U/L (ref 33–130)
ALT: 55 U/L — AB (ref 6–29)
AST: 36 U/L — ABNORMAL HIGH (ref 10–35)
Albumin: 4.3 g/dL (ref 3.6–5.1)
BILIRUBIN TOTAL: 0.4 mg/dL (ref 0.2–1.2)
BUN/Creatinine Ratio: 30 (calc) — ABNORMAL HIGH (ref 6–22)
BUN: 26 mg/dL — ABNORMAL HIGH (ref 7–25)
CALCIUM: 9.2 mg/dL (ref 8.6–10.4)
CHLORIDE: 104 mmol/L (ref 98–110)
CO2: 28 mmol/L (ref 20–32)
Creat: 0.88 mg/dL (ref 0.50–0.99)
GFR, EST NON AFRICAN AMERICAN: 71 mL/min/{1.73_m2} (ref >=60)
GFR, Est African American: 82 mL/min/{1.73_m2} (ref >=60)
GLOBULIN: 2.5 g/dL (ref 1.9–3.7)
Glucose, Bld: 114 mg/dL — ABNORMAL HIGH (ref 65–99)
POTASSIUM: 3.9 mmol/L (ref 3.5–5.3)
SODIUM: 140 mmol/L (ref 135–146)
Total Protein: 6.8 g/dL (ref 6.1–8.1)

## 2017-09-12 LAB — TSH: TSH: 3.85 mIU/L (ref 0.40–4.50)

## 2017-09-12 LAB — HEMOGLOBIN A1C
Hgb A1c MFr Bld: 5.8 % of total Hgb — ABNORMAL HIGH (ref <5.7)
Mean Plasma Glucose: 120 (calc)
eAG (mmol/L): 6.6 (calc)

## 2017-09-14 DIAGNOSIS — Z955 Presence of coronary angioplasty implant and graft: Secondary | ICD-10-CM | POA: Diagnosis not present

## 2017-09-15 ENCOUNTER — Other Ambulatory Visit: Payer: Self-pay | Admitting: *Deleted

## 2017-09-15 ENCOUNTER — Ambulatory Visit: Payer: BLUE CROSS/BLUE SHIELD | Admitting: Physician Assistant

## 2017-09-15 ENCOUNTER — Encounter: Payer: Self-pay | Admitting: Physician Assistant

## 2017-09-15 ENCOUNTER — Telehealth: Payer: Self-pay | Admitting: *Deleted

## 2017-09-15 VITALS — BP 105/64 | HR 69 | Temp 98.3°F | Resp 16 | Ht <= 58 in | Wt 115.0 lb

## 2017-09-15 DIAGNOSIS — M41125 Adolescent idiopathic scoliosis, thoracolumbar region: Secondary | ICD-10-CM

## 2017-09-15 DIAGNOSIS — E039 Hypothyroidism, unspecified: Secondary | ICD-10-CM

## 2017-09-15 DIAGNOSIS — F3342 Major depressive disorder, recurrent, in full remission: Secondary | ICD-10-CM

## 2017-09-15 DIAGNOSIS — M810 Age-related osteoporosis without current pathological fracture: Secondary | ICD-10-CM | POA: Diagnosis not present

## 2017-09-15 DIAGNOSIS — G8929 Other chronic pain: Secondary | ICD-10-CM

## 2017-09-15 DIAGNOSIS — R61 Generalized hyperhidrosis: Secondary | ICD-10-CM | POA: Diagnosis not present

## 2017-09-15 DIAGNOSIS — F5102 Adjustment insomnia: Secondary | ICD-10-CM

## 2017-09-15 DIAGNOSIS — E785 Hyperlipidemia, unspecified: Secondary | ICD-10-CM

## 2017-09-15 DIAGNOSIS — M545 Low back pain: Secondary | ICD-10-CM

## 2017-09-15 DIAGNOSIS — I25119 Atherosclerotic heart disease of native coronary artery with unspecified angina pectoris: Secondary | ICD-10-CM

## 2017-09-15 DIAGNOSIS — L219 Seborrheic dermatitis, unspecified: Secondary | ICD-10-CM | POA: Diagnosis not present

## 2017-09-15 MED ORDER — TRAMADOL HCL 50 MG PO TABS: 100.0000 mg | ORAL_TABLET | Freq: Four times a day (QID) | ORAL | 0 refills | Status: DC | PRN

## 2017-09-15 MED ORDER — CLOBETASOL PROPIONATE 0.05 % EX FOAM: Freq: Two times a day (BID) | CUTANEOUS | 1 refills | Status: DC

## 2017-09-15 MED ORDER — ATORVASTATIN CALCIUM 40 MG PO TABS: 40.0000 mg | ORAL_TABLET | Freq: Every day | ORAL | Status: DC

## 2017-09-15 MED ORDER — LEVOTHYROXINE SODIUM 137 MCG PO TABS: 137.0000 ug | ORAL_TABLET | Freq: Every day | ORAL | 1 refills | Status: DC

## 2017-09-15 NOTE — Progress Notes (Signed)
Subjective:    Patient ID: Leslie Roberson, female    DOB: 10/19/1955, 62 y.o.   MRN: [id number]  HPI  Pt is a 62 yo female with CAD, hypothyroidism, asthma, GERD, MDD, GAD  Who presents to the clinic with her husband to follow up.   She would like to go over labs just drawn.   She has a lot of sweating over face and back on neck. Did not start until she start lexapro. She also has tender bumps on the back of her head. She has dry itchy scalp.   Pt is s/p stent placement for CAD. Lipids done and lipitor decreased due to elevated liver enyzmes.   MDD/GAD- down to 1 klonapin a day. She is doing pretty good. Dr. Pricilla Handler is managing.   .. Active Ambulatory Problems    Diagnosis Date Noted  . GERD (gastroesophageal reflux disease) 11/27/2015  . Osteoporosis 11/27/2015  . GAD (generalized anxiety disorder) 11/27/2015  . MDD (major depressive disorder), recurrent, in full remission ([place]) 11/27/2015  . Chronic dryness of both eyes 11/27/2015  . Hypothyroidism 11/27/2015  . Asthma, chronic 11/27/2015  . Insomnia 11/27/2015  . Seborrheic keratoses 11/27/2015  . Diverticulitis of colon 12/21/2015  . Hip bursitis 12/25/2015  . Biceps tendonitis on right 12/25/2015  . Idiopathic scoliosis 01/22/2016  . Vaginal atrophy 01/31/2016  . Rosacea 02/04/2016  . Right knee pain 02/19/2016  . Paresthesia 04/03/2016  . Facet hypertrophy of lumbosacral region 04/03/2016  . Chronic back pain 04/03/2016  . Bad taste in mouth 07/26/2016  . No energy 09/15/2016  . Lump in throat 01/27/2017  . Dysphagia 01/27/2017  . Chronic tension-type headache, intractable 02/05/2017  . Prediabetes 02/05/2017  . Right leg pain 02/06/2017  . Atypical chest pain 05/24/2017  . Itchy scalp 05/24/2017  . Pneumothorax 06/25/2017  . Angina, class III ([place]) 07/07/2017  . Coronary artery disease involving native coronary artery of native heart with angina pectoris ([place])   . Tongue mass 08/14/2017  . Excessive  sweating 09/22/2017   Resolved Ambulatory Problems    Diagnosis Date Noted  . Acute medial meniscus tear 04/23/2016  . Left knee pain 04/28/2016  . Mild intermittent asthma without complication 76/72/0947   Past Medical History:  Diagnosis Date  . Anxiety   . Arthritis   . Asthma, chronic 11/27/2015  . Chronic back pain   . Coronary artery disease   . Depression   . Diverticulitis of colon 12/21/2015  . GERD (gastroesophageal reflux disease) 11/27/2015  . History of blood transfusion   . Hypothyroidism   . Idiopathic scoliosis 01/22/2016  . Osteoporosis 11/27/2015  . Pneumonia 2016  . Pneumothorax 06/26/2017  . Pre-diabetes   . Thyroid disease       Review of Systems See HPI.     Objective:   Physical Exam  Constitutional: She is oriented to person, place, and time. She appears well-developed and well-nourished.  HENT:  Head: Normocephalic and atraumatic.  No sweating observed today. There were some erythematous tiny papules on the back of skull around base.   Cardiovascular: Normal rate and regular rhythm.  Pulmonary/Chest: Effort normal and breath sounds normal.  Neurological: She is alert and oriented to person, place, and time.  Psychiatric: She has a normal mood and affect. Her behavior is normal.          Assessment & Plan:  Marland KitchenMarland KitchenMarthena was seen today for lab results, excessive sweating and bumps and questions about medications.  Diagnoses and all orders  for this visit:  Coronary artery disease involving native coronary artery of native heart with angina pectoris (HCC)  Osteoporosis, unspecified osteoporosis type, unspecified pathological fracture presence  MDD (major depressive disorder), recurrent, in full remission (HCC)  Adjustment insomnia  Excessive sweating  Seborrheic dermatitis of scalp -     clobetasol (OLUX) 0.05 % topical foam; Apply topically 2 (two) times daily.  Adolescent idiopathic scoliosis of thoracolumbar region  Chronic bilateral  low back pain without sciatica -     traMADol (ULTRAM) 50 MG tablet; Take 2 tablets (100 mg total) by mouth every 6 (six) hours as needed.  Acquired hypothyroidism  Other orders -     Discontinue: levothyroxine (SYNTHROID, LEVOTHROID) 137 MCG tablet; Take 1 tablet (137 mcg total) by mouth daily before breakfast.   Discussed labs Dr. Stanford Breed drew and he was going to decrease lipitor in half to 40mg  and recheck slightly elevated liver enzymes.  a1c is great at 5.8. Doing great with diet control.  Thyroid upper end of normal. Will increased levothyroxine. orginally thought she was on 180mcg daily. She calls back to say she is on 1/2 tablet of 125mg  daily. I said to alernate 1/2 tablet and 1 tablet daily.   Sweating appears to be coming from addition of lexapro. Bring this up with Dr. Pricilla Handler. Discussed cutting in half. Certainly see if increase in thyroid medication will make patient feel better.   Discussed seb derm. olux given. HO given.   Tramadol given as needed for low back pain. Not on contract yet. Discussed to use sparingly.

## 2017-09-15 NOTE — Telephone Encounter (Addendum)
pt aware of results and Lab orders mailed to the pt   ----- Message from Lelon Perla, MD sent at 09/12/2017  1:11 AM EDT ----- Change lipitor to 40 mg daily, lipids and liver 4 weeks Kirk Ruths

## 2017-09-16 DIAGNOSIS — Z955 Presence of coronary angioplasty implant and graft: Secondary | ICD-10-CM | POA: Diagnosis not present

## 2017-09-21 DIAGNOSIS — Z955 Presence of coronary angioplasty implant and graft: Secondary | ICD-10-CM | POA: Diagnosis not present

## 2017-09-22 ENCOUNTER — Encounter: Payer: Self-pay | Admitting: Physician Assistant

## 2017-09-22 ENCOUNTER — Ambulatory Visit (INDEPENDENT_AMBULATORY_CARE_PROVIDER_SITE_OTHER): Payer: BLUE CROSS/BLUE SHIELD | Admitting: Licensed Clinical Social Worker

## 2017-09-22 DIAGNOSIS — F411 Generalized anxiety disorder: Secondary | ICD-10-CM | POA: Diagnosis not present

## 2017-09-22 DIAGNOSIS — R61 Generalized hyperhidrosis: Secondary | ICD-10-CM | POA: Insufficient documentation

## 2017-09-22 DIAGNOSIS — F331 Major depressive disorder, recurrent, moderate: Secondary | ICD-10-CM

## 2017-09-22 NOTE — Telephone Encounter (Signed)
Ok

## 2017-09-22 NOTE — Progress Notes (Signed)
   THERAPIST PROGRESS NOTE  Session Time: 11:04am-11:59am  Participation Level: Active  Behavioral Response: Casual  Alert  Mostly euthymic  Type of Therapy: Individual Therapy  Treatment Goals addressed:  Elevate mood and show evidence of usual energy, activities, and socialization Connect with others in the community, find more things to look forward to on a daily basis  Interventions: Behavioral activation, solution focused  Suicidal/Homicidal: Denied both  Therapist Interventions:    Prompted patient to consider the idea of volunteering.  Suggested looking up opportunities through [employer]. Introdcued patient to a tool called the Depression Self-Care Action Plan.  Explained that it involves creating goals in the following categories: staying physically active, engaging in pleasurable activities, spending time with others, relaxation, and setting goals and breaking them down into smaller steps.  Emphasized the importance of giving yourself credit for what you accomplish each day.           Summary: Continues to have insomnia.  Estimates getting 4-7 hours of sleep.  Mood has been low for the most part but noted there have been moments when it has lifted.  Enjoyed a weekend away in [place] visiting family.  Also enjoyed going on a hike with her husband for Father's Day.   Indicated she intends to check out opportunities through [employer]. Said she likes the idea of using the Depression Self-Care Action Plan.  Indicated she thinks it will be helpful to have a concrete plan for addressing her depression.     Plan: Return in approximately one month.  Will check on implementation of Depression Self-Care Action Plan  Treatment plan review is due 12/02/17  Diagnosis:  GAD MDD, recurrent, moderate   Armandina Stammer 09/22/2017

## 2017-09-23 ENCOUNTER — Telehealth (HOSPITAL_COMMUNITY): Payer: Self-pay | Admitting: Psychiatry

## 2017-09-23 ENCOUNTER — Other Ambulatory Visit: Payer: Self-pay | Admitting: *Deleted

## 2017-09-23 DIAGNOSIS — F411 Generalized anxiety disorder: Secondary | ICD-10-CM

## 2017-09-23 DIAGNOSIS — Z955 Presence of coronary angioplasty implant and graft: Secondary | ICD-10-CM | POA: Diagnosis not present

## 2017-09-23 DIAGNOSIS — F3342 Major depressive disorder, recurrent, in full remission: Secondary | ICD-10-CM

## 2017-09-23 MED ORDER — BUSPIRONE HCL 30 MG PO TABS: 30.0000 mg | ORAL_TABLET | Freq: Two times a day (BID) | ORAL | 3 refills | Status: DC

## 2017-09-23 NOTE — Progress Notes (Signed)
Refill Buspar per Luvenia Starch

## 2017-09-23 NOTE — Telephone Encounter (Signed)
Pt states the lexapro is causing excessive sweating.  Also, due to a news report from yesterday, she would like to come off of it. The news report states that lexapro was one of the rx that was causing dementia.  Please advise cb [phone]

## 2017-09-24 DIAGNOSIS — Z955 Presence of coronary angioplasty implant and graft: Secondary | ICD-10-CM | POA: Diagnosis not present

## 2017-09-24 NOTE — Telephone Encounter (Signed)
Patient did not go into detail about the news report, just that she saw it a few nights ago. I informed patient of what Dr. De Nurse stated in previous message. She wants to stop the Lexapro and wants something to replace it.

## 2017-09-24 NOTE — Telephone Encounter (Signed)
Not sure which news report and authenticity. Please explain . In the meantime cut down lexapro to half if wants to be off from it.  Continue pristiq and other meds

## 2017-09-25 NOTE — Telephone Encounter (Signed)
Please bring her in for Gene testing and meds adjustment will be later. For now continue half tablet of lexapro and other meds

## 2017-09-28 ENCOUNTER — Other Ambulatory Visit: Payer: Self-pay | Admitting: Cardiology

## 2017-09-28 DIAGNOSIS — F331 Major depressive disorder, recurrent, moderate: Secondary | ICD-10-CM | POA: Diagnosis not present

## 2017-09-28 DIAGNOSIS — Z955 Presence of coronary angioplasty implant and graft: Secondary | ICD-10-CM | POA: Diagnosis not present

## 2017-09-28 NOTE — Telephone Encounter (Signed)
Called and left a VM informing patient per Dr. De Nurse to continue to take 1/2 tab of lexapro and to come in and get the GeneSight testing done.

## 2017-09-30 ENCOUNTER — Other Ambulatory Visit: Payer: Self-pay | Admitting: Physician Assistant

## 2017-09-30 ENCOUNTER — Encounter: Payer: Self-pay | Admitting: Psychiatry

## 2017-09-30 DIAGNOSIS — Z955 Presence of coronary angioplasty implant and graft: Secondary | ICD-10-CM | POA: Diagnosis not present

## 2017-10-05 DIAGNOSIS — Z955 Presence of coronary angioplasty implant and graft: Secondary | ICD-10-CM | POA: Diagnosis not present

## 2017-10-07 ENCOUNTER — Other Ambulatory Visit: Payer: Self-pay | Admitting: Physician Assistant

## 2017-10-07 DIAGNOSIS — Z955 Presence of coronary angioplasty implant and graft: Secondary | ICD-10-CM | POA: Diagnosis not present

## 2017-10-08 DIAGNOSIS — Z955 Presence of coronary angioplasty implant and graft: Secondary | ICD-10-CM | POA: Diagnosis not present

## 2017-10-12 DIAGNOSIS — Z955 Presence of coronary angioplasty implant and graft: Secondary | ICD-10-CM | POA: Diagnosis not present

## 2017-10-13 ENCOUNTER — Encounter: Payer: Self-pay | Admitting: Physician Assistant

## 2017-10-13 ENCOUNTER — Other Ambulatory Visit: Payer: Self-pay

## 2017-10-13 ENCOUNTER — Encounter (HOSPITAL_COMMUNITY): Payer: Self-pay | Admitting: Psychiatry

## 2017-10-13 ENCOUNTER — Ambulatory Visit (INDEPENDENT_AMBULATORY_CARE_PROVIDER_SITE_OTHER): Payer: BLUE CROSS/BLUE SHIELD | Admitting: Psychiatry

## 2017-10-13 VITALS — BP 108/66 | HR 81 | Ht <= 58 in | Wt 112.0 lb

## 2017-10-13 DIAGNOSIS — F331 Major depressive disorder, recurrent, moderate: Secondary | ICD-10-CM | POA: Diagnosis not present

## 2017-10-13 DIAGNOSIS — E039 Hypothyroidism, unspecified: Secondary | ICD-10-CM

## 2017-10-13 DIAGNOSIS — F411 Generalized anxiety disorder: Secondary | ICD-10-CM | POA: Diagnosis not present

## 2017-10-13 DIAGNOSIS — F5102 Adjustment insomnia: Secondary | ICD-10-CM | POA: Diagnosis not present

## 2017-10-13 MED ORDER — CLONAZEPAM 0.5 MG PO TABS: 0.5000 mg | ORAL_TABLET | Freq: Every day | ORAL | 1 refills | Status: DC

## 2017-10-13 MED ORDER — FLUOXETINE HCL 10 MG PO CAPS: 10.0000 mg | ORAL_CAPSULE | Freq: Every day | ORAL | 0 refills | Status: DC

## 2017-10-13 MED ORDER — TRAZODONE HCL 50 MG PO TABS
ORAL_TABLET | ORAL | 1 refills | Status: DC
Start: 2017-10-13 — End: 2019-02-07
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
## 2017-10-14 DIAGNOSIS — Z955 Presence of coronary angioplasty implant and graft: Secondary | ICD-10-CM | POA: Diagnosis not present

## 2017-10-15 DIAGNOSIS — Z955 Presence of coronary angioplasty implant and graft: Secondary | ICD-10-CM | POA: Diagnosis not present

## 2017-10-19 DIAGNOSIS — Z955 Presence of coronary angioplasty implant and graft: Secondary | ICD-10-CM | POA: Diagnosis not present

## 2017-10-20 ENCOUNTER — Telehealth (HOSPITAL_COMMUNITY): Payer: Self-pay

## 2017-10-20 NOTE — Telephone Encounter (Signed)
Patient called stating that Dr. De Nurse put her on Prozac last week and since she has started taking the medication she has been constipated. Patient says she has been eating fruits, salads, and using mineral oils along with stool softeners. She also claims she is jittery during the day can not seem to relax. Please review and advise.

## 2017-10-21 DIAGNOSIS — Z955 Presence of coronary angioplasty implant and graft: Secondary | ICD-10-CM | POA: Diagnosis not present

## 2017-10-21 NOTE — Telephone Encounter (Signed)
She can stop prozac and also need to cut down buspar to half bid. Not to take 30mg  bid but half dose. Call back in 2 days so we can know before suggesting other med. Report to ED for urgent concerns Drink more water and can take regular klonopine half during the day and half at night

## 2017-10-21 NOTE — Telephone Encounter (Signed)
Informed patient per Dr. De Nurse to stop prozac and cut down buspar to 1/2 bid, drink more water, take regular klonopine, half during the day and half at night. I also told patient to call back in 2 days to let us know how she is doing and to go to ED for urgent concerns. Patient did not want to do what Dr. De Nurse suggests but she says she will give it a try and call back tomorrow instead of the 2 day we suggested.

## 2017-10-22 DIAGNOSIS — Z955 Presence of coronary angioplasty implant and graft: Secondary | ICD-10-CM | POA: Diagnosis not present

## 2017-10-23 ENCOUNTER — Ambulatory Visit (INDEPENDENT_AMBULATORY_CARE_PROVIDER_SITE_OTHER): Payer: BLUE CROSS/BLUE SHIELD | Admitting: Licensed Clinical Social Worker

## 2017-10-23 ENCOUNTER — Other Ambulatory Visit: Payer: Self-pay | Admitting: Physician Assistant

## 2017-10-23 ENCOUNTER — Telehealth (HOSPITAL_COMMUNITY): Payer: Self-pay

## 2017-10-23 DIAGNOSIS — F411 Generalized anxiety disorder: Secondary | ICD-10-CM

## 2017-10-23 DIAGNOSIS — E785 Hyperlipidemia, unspecified: Secondary | ICD-10-CM | POA: Diagnosis not present

## 2017-10-23 DIAGNOSIS — E039 Hypothyroidism, unspecified: Secondary | ICD-10-CM | POA: Diagnosis not present

## 2017-10-23 DIAGNOSIS — F331 Major depressive disorder, recurrent, moderate: Secondary | ICD-10-CM | POA: Diagnosis not present

## 2017-10-23 LAB — LIPID PANEL
Cholesterol: 109 mg/dL (ref <200)
HDL: 59 mg/dL (ref >50)
LDL Cholesterol (Calc): 37 mg/dL (calc)
NON-HDL CHOLESTEROL (CALC): 50 mg/dL (ref <130)
Total CHOL/HDL Ratio: 1.8 (calc) (ref <5.0)
Triglycerides: 58 mg/dL (ref <150)

## 2017-10-23 LAB — HEPATIC FUNCTION PANEL
AG Ratio: 2 (calc) (ref 1.0–2.5)
ALKALINE PHOSPHATASE (APISO): 57 U/L (ref 33–130)
ALT: 58 U/L — ABNORMAL HIGH (ref 6–29)
AST: 31 U/L (ref 10–35)
Albumin: 4.3 g/dL (ref 3.6–5.1)
BILIRUBIN INDIRECT: 0.3 mg/dL (ref 0.2–1.2)
Bilirubin, Direct: 0.1 mg/dL (ref 0.0–0.2)
GLOBULIN: 2.1 g/dL (ref 1.9–3.7)
TOTAL PROTEIN: 6.4 g/dL (ref 6.1–8.1)
Total Bilirubin: 0.4 mg/dL (ref 0.2–1.2)

## 2017-10-23 LAB — TSH: TSH: 0.28 m[IU]/L — AB (ref 0.40–4.50)

## 2017-10-23 NOTE — Progress Notes (Signed)
   THERAPIST PROGRESS NOTE  Session Time: 11:01am-11:45am  Participation Level: Active  Behavioral Response: Casual  Alert  Euthymic  Type of Therapy: Individual Therapy  Treatment Goals addressed:  Elevate mood and show evidence of usual energy, activities, and socialization Connect with others in the community, find more things to look forward to on a daily basis  Interventions: Behavioral activation, assessment  Suicidal/Homicidal: Denied both  Therapist Interventions:    Revisited the Depression Self-Care Action Plan.  Determined that patient has been working on goals in all the different categories.        Summary:  Reported having some down moods in the past month, but they were short lived.  Noted she has had some medication changes.  Took Prozac for about two weeks but found it made her constipated and agitated.  Has reached out to psychiatrist to let him know. Looked into volunteering.  Is considering doing so at [facility] been [facility] to.  Noted she has made a friend there who has been volunteering.        Plan: Return in approximately one month.  Will need to update treatment plan at that time.  Treatment plan review is due 12/02/17  Diagnosis:  GAD MDD, recurrent, moderate   Leslie Roberson 10/23/2017

## 2017-10-23 NOTE — Telephone Encounter (Signed)
Patient is currently in office for therapy. I informed her that Dr. De Nurse wants to to consider Remeron. She states that she has taken Remeron years ago and it made her feel really hungry. She wants to know if Dr. De Nurse can offer another recommendation.

## 2017-10-23 NOTE — Telephone Encounter (Signed)
I want to lower the buspar to half a day only for now as serotonergic drugs together can contribute to anxiety and jitterness. Have stopped prozac. For now a small dose of remeron is recommended as it does not effect serotonin Also to take klonopine half bid regularly for now

## 2017-10-23 NOTE — Telephone Encounter (Signed)
Patient did not pick up. Considering remeron 7.5mg  qhs tablet for depression and further cut down buspar to half a day only . Continue klonopine half twice a day.

## 2017-10-23 NOTE — Telephone Encounter (Signed)
Patient called stating that she still feels anxious since stopping the Prozac and cutting the Buspar in half. She wants to know what she needs to to do. Please review and advise.

## 2017-10-26 ENCOUNTER — Encounter: Payer: Self-pay | Admitting: Physician Assistant

## 2017-10-26 ENCOUNTER — Telehealth: Payer: Self-pay | Admitting: *Deleted

## 2017-10-26 DIAGNOSIS — E785 Hyperlipidemia, unspecified: Secondary | ICD-10-CM

## 2017-10-26 DIAGNOSIS — Z955 Presence of coronary angioplasty implant and graft: Secondary | ICD-10-CM | POA: Diagnosis not present

## 2017-10-26 MED ORDER — ATORVASTATIN CALCIUM 20 MG PO TABS: 20.0000 mg | ORAL_TABLET | Freq: Every day | ORAL | 3 refills | Status: DC

## 2017-10-26 MED ORDER — MIRTAZAPINE 7.5 MG PO TABS: 7.5000 mg | ORAL_TABLET | Freq: Every day | ORAL | 0 refills | Status: DC

## 2017-10-26 NOTE — Telephone Encounter (Signed)
-----   Message from Lelon Perla, MD sent at 10/24/2017  8:02 AM EDT ----- Change lipitor to 20 mg daily; lipids and liver 4 weeks; please ask pt to fu with primary care to adjust thyroid meds Kirk Ruths

## 2017-10-26 NOTE — Progress Notes (Signed)
We are over supplementing a little too much with your thyroid dosage. Remind me before the 137 alternating 1 tablet and 1/2 tablet you were on the 125mg  tablet daily? I just want to confirm this dosing first to try to aim for a TSH of around 2. Does that make sense?

## 2017-10-26 NOTE — Telephone Encounter (Signed)
Spoke with pt, aware of medication change. New script sent to the pharmacy and Lab orders mailed to the pt

## 2017-10-26 NOTE — Telephone Encounter (Signed)
Informed patient of what Dr. De Nurse stated in previous message. She finally decided to try the Remeron for a couple of weeks. I sent in a one month supply of Remeron 7.5mg  to pharmacy. Nothing further is needed at this time.

## 2017-10-27 ENCOUNTER — Encounter: Payer: Self-pay | Admitting: Physician Assistant

## 2017-10-27 NOTE — Progress Notes (Signed)
HPI: Follow-up coronary artery disease.  I initially saw patient in March 2019 with chest pain.  We scheduled a cardiac catheterization but patient subsequently presented with pneumothorax. She ultimately underwent cardiac catheterization April 2019 and was found to have a 99% mid to distal right coronary artery, 75% proximal LAD followed by 60% lesion and normal LV function.  The patient had PCI of both lesions with drug-eluting stents.  Laboratories July 2019 showed low TSH and patient asked to follow-up with primary care.  Since last seen, patient has mild dyspnea in the hot weather but otherwise denies dyspnea on exertion, orthopnea, PND, pedal edema, chest pain or syncope.  Current Outpatient Medications  Medication Sig Dispense Refill  . albuterol (PROVENTIL HFA;VENTOLIN HFA) 108 (90 Base) MCG/ACT inhaler Inhale 1-2 puffs into the lungs every 4 (four) hours as needed for wheezing or shortness of breath. 1 Inhaler 3  . aspirin EC 81 MG tablet Take 81 mg by mouth at bedtime.    Marland Kitchen atorvastatin (LIPITOR) 20 MG tablet Take 1 tablet (20 mg total) by mouth daily. 90 tablet 3  . busPIRone (BUSPAR) 30 MG tablet Take 1 tablet (30 mg total) by mouth 2 (two) times daily. 60 tablet 3  . calcium-vitamin D (OSCAL WITH D) 500-200 MG-UNIT tablet Take 1 tablet by mouth daily.    . clobetasol (OLUX) 0.05 % topical foam Apply topically 2 (two) times daily. 50 g 1  . clonazePAM (KLONOPIN) 0.5 MG tablet Take 1 tablet (0.5 mg total) by mouth at bedtime. 30 tablet 1  . clopidogrel (PLAVIX) 75 MG tablet Take 1 tablet (75 mg total) by mouth daily with breakfast. 90 tablet 2  . denosumab (PROLIA) 60 MG/ML SOLN injection Inject 60 mg into the skin every 6 (six) months. Administer in upper arm, thigh, or abdomen    . desvenlafaxine (PRISTIQ) 100 MG 24 hr tablet Take 100 mg by mouth at bedtime 30 tablet 4  . desvenlafaxine (PRISTIQ) 50 MG 24 hr tablet Take 50 mg by mouth at bedtime 90 tablet 1  . diclofenac sodium  (VOLTAREN) 1 % GEL APPLY 4G TOPICALLY 4 TIMES DAILY (Patient taking differently: Apply 4 g topically 4 (four) times daily as needed (to affected painful sites). ) 100 g 12  . FLUoxetine (PROZAC) 10 MG capsule Take 1 capsule (10 mg total) by mouth daily. 30 capsule 0  . gabapentin (NEURONTIN) 300 MG capsule TAKE 1 CAP AT BEDTIME X1 WEEK,1 CAP TWICE DAILY X1 WEEK THEN 1 CAP 3X DAILY MAY DOUBLE WEEKLY=3600MG  (Patient taking differently: Take 300 mg by mouth in the morning and 600 mg at bedtime) 180 capsule 3  . lidocaine (LIDODERM) 5 % Place 1 patch onto the skin daily. Remove & Discard patch within 12 hours or as directed by MD (Patient taking differently: Place 1 patch onto the skin daily as needed (for pain). Remove & Discard patch within 12 hours or as directed by MD) 30 patch 12  . Menthol, Topical Analgesic, (MINERAL ICE EX) Apply 1 application to affected sites one to two times a day as needed (for pain)    . metoprolol succinate (TOPROL XL) 25 MG 24 hr tablet Take 1 tablet (25 mg total) by mouth daily. 90 tablet 3  . mirtazapine (REMERON) 7.5 MG tablet Take 1 tablet (7.5 mg total) by mouth at bedtime. 30 tablet 0  . nitroGLYCERIN (NITROSTAT) 0.4 MG SL tablet PLACE 1 TABLET (0.4 MG TOTAL) UNDER THE TONGUE EVERY 5 (FIVE) MINUTES AS  NEEDED. 25 tablet 2  . Omega-3 Fatty Acids (FISH OIL PO) Take 1 capsule by mouth daily.     . pantoprazole (PROTONIX) 40 MG tablet Take 1 tablet (40 mg total) by mouth daily. (Patient taking differently: Take 40 mg by mouth every morning. ) 90 tablet 1  . traMADol (ULTRAM) 50 MG tablet Take 2 tablets (100 mg total) by mouth every 6 (six) hours as needed. 40 tablet 0  . traZODone (DESYREL) 50 MG tablet TAKE 1 TABLET BY MOUTH EVERYDAY AT BEDTIME 90 tablet 1   No current facility-administered medications for this visit.      Past Medical History:  Diagnosis Date  . Anxiety   . Arthritis    "right little finger; base of thumb left hand; spine" (07/07/2017)  . Asthma,  chronic 11/27/2015  . Chronic back pain   . Coronary artery disease    4/19 PCI/DEx2 to LAD, and PCI/DES x1 to RCA, normal EF  . Depression   . Diverticulitis of colon 12/21/2015   Colonoscopy every 5 years/digestive health.   . GERD (gastroesophageal reflux disease) 11/27/2015  . History of blood transfusion    "related to spinal fusions"  . Hypothyroidism   . Idiopathic scoliosis 01/22/2016  . Osteoporosis 11/27/2015   On prolia.   . Pneumonia 2016  . Pneumothorax 06/26/2017  . Pre-diabetes   . Thyroid disease     Past Surgical History:  Procedure Laterality Date  . ABDOMINAL HYSTERECTOMY    . bladder mesh     S/P bladder sling"  . CORONARY STENT INTERVENTION N/A 07/07/2017   Procedure: CORONARY STENT INTERVENTION;  Surgeon: Leonie Man, MD;  Location: [place] [facility];  Service: Cardiovascular;  Laterality: N/A;  . ELBOW SURGERY Right    "dislocation"  . INCONTINENCE SURGERY     "didn't work"  . INTRAVASCULAR PRESSURE WIRE/FFR STUDY N/A 07/07/2017   Procedure: INTRAVASCULAR PRESSURE WIRE/FFR STUDY;  Surgeon: Leonie Man, MD;  Location: [place] [facility];  Service: Cardiovascular;  Laterality: N/A;  . JOINT REPLACEMENT    . LEFT HEART CATH AND CORONARY ANGIOGRAPHY N/A 07/07/2017   Procedure: LEFT HEART CATH AND CORONARY ANGIOGRAPHY;  Surgeon: Leonie Man, MD;  Location: [place] [facility];  Service: Cardiovascular;  Laterality: N/A;  . SPINAL FUSION  ~ 1970 X 3   "below neck - lower back"  . TONSILLECTOMY    . TOTAL HIP ARTHROPLASTY Left     Social History   Socioeconomic History  . Marital status: Married    Spouse name: Not on file  . Number of children: 1  . Years of education: Not on file  . Highest education level: Not on file  Occupational History  . Not on file  Social Needs  . Financial resource strain: Not on file  . Food insecurity:    Worry: Not on file    Inability: Not on file  . Transportation needs:    Medical: Not on file     Non-medical: Not on file  Tobacco Use  . Smoking status: Never Smoker  . Smokeless tobacco: Never Used  Substance and Sexual Activity  . Alcohol use: Yes    Comment: 07/07/2017 "5-6 times/year; 1 drink at each occasion"  . Drug use: No  . Sexual activity: Yes  Lifestyle  . Physical activity:    Days per week: Not on file    Minutes per session: Not on file  . Stress: Not on file  Relationships  . Social  connections:    Talks on phone: Not on file    Gets together: Not on file    Attends religious service: Not on file    Active member of club or organization: Not on file    Attends meetings of clubs or organizations: Not on file    Relationship status: Not on file  . Intimate partner violence:    Fear of current or ex partner: Not on file    Emotionally abused: Not on file    Physically abused: Not on file    Forced sexual activity: Not on file  Other Topics Concern  . Not on file  Social History Narrative  . Not on file    Family History  Problem Relation Age of Onset  . Cancer Mother        lung  . Depression Mother   . Heart attack Father   . Hyperlipidemia Father   . Hypertension Father   . Diabetes Maternal Aunt   . Hyperlipidemia Paternal Aunt   . COPD Paternal Aunt     ROS: no fevers or chills, productive cough, hemoptysis, dysphasia, odynophagia, melena, hematochezia, dysuria, hematuria, rash, seizure activity, orthopnea, PND, pedal edema, claudication. Remaining systems are negative.  Physical Exam: Well-developed well-nourished in no acute distress.  Skin is warm and dry.  HEENT is normal.  Back with scoliosis Neck is supple.  Chest is clear to auscultation with normal expansion.  Cardiovascular exam is regular rate and rhythm.  Abdominal exam nontender or distended. No masses palpated. Extremities show no edema. neuro grossly intact  A/P  1 coronary artery disease-patient is status post PCI of LAD and right coronary artery.  Continue aspirin,  Plavix and statin.  She is not having chest pain.  2 hyperlipidemia-continue statin.  Kirk Ruths, MD

## 2017-10-28 ENCOUNTER — Encounter: Payer: Self-pay | Admitting: Cardiology

## 2017-10-28 ENCOUNTER — Ambulatory Visit (INDEPENDENT_AMBULATORY_CARE_PROVIDER_SITE_OTHER): Payer: BLUE CROSS/BLUE SHIELD | Admitting: Cardiology

## 2017-10-28 VITALS — BP 104/63 | HR 64 | Ht <= 58 in | Wt 113.4 lb

## 2017-10-28 DIAGNOSIS — E7800 Pure hypercholesterolemia, unspecified: Secondary | ICD-10-CM | POA: Diagnosis not present

## 2017-10-28 DIAGNOSIS — Z955 Presence of coronary angioplasty implant and graft: Secondary | ICD-10-CM | POA: Diagnosis not present

## 2017-10-28 DIAGNOSIS — I2510 Atherosclerotic heart disease of native coronary artery without angina pectoris: Secondary | ICD-10-CM | POA: Diagnosis not present

## 2017-10-28 NOTE — Patient Instructions (Signed)
Your physician wants you to follow-up in: ONE YEAR WITH DR CRENSHAW You will receive a reminder letter in the mail two months in advance. If you don't receive a letter, please call our office to schedule the follow-up appointment.   If you need a refill on your cardiac medications before your next appointment, please call your pharmacy.  

## 2017-10-29 ENCOUNTER — Telehealth (HOSPITAL_COMMUNITY): Payer: Self-pay | Admitting: Psychiatry

## 2017-10-29 DIAGNOSIS — Z955 Presence of coronary angioplasty implant and graft: Secondary | ICD-10-CM | POA: Diagnosis not present

## 2017-10-29 NOTE — Telephone Encounter (Signed)
Pt calling to report she is still not sleeping. Also when she does sleep then she is having bizarre dreams.  She has been really depressed lately.   Please advise.  She will not be able to answer her phone from 10-12 today.  [phone]

## 2017-10-30 NOTE — Telephone Encounter (Signed)
At this point I would suggest referral for sleep medicine/clinic in regard to sleep issues.  I would also suggest increasing remeron to bid and referral to Partial program for depression and more frequent therapy appointments otherwise with Judson Roch.  Most meds have been tried and Gene testing done  Involve in outdoor or activities to distract from worries Emergency protocol as before

## 2017-10-30 NOTE — Telephone Encounter (Signed)
Patient called back this morning checking to see if Dr. De Nurse answered the messaged from yesterday because per patient, she do not want to go the whole weekend without hearing from him like she did last week.

## 2017-10-30 NOTE — Telephone Encounter (Signed)
Patient is willing to try the Partial program. She will try taking the Remeron bid to see how it does her. She states that she has already had a sleep study done in the past and because of her scoliosis it was hard for her to stay asleep for the study to work.

## 2017-11-02 ENCOUNTER — Telehealth (HOSPITAL_COMMUNITY): Payer: Self-pay | Admitting: Professional

## 2017-11-02 ENCOUNTER — Telehealth (HOSPITAL_COMMUNITY): Payer: Self-pay | Admitting: Psychiatry

## 2017-11-02 DIAGNOSIS — Z955 Presence of coronary angioplasty implant and graft: Secondary | ICD-10-CM | POA: Diagnosis not present

## 2017-11-02 NOTE — Telephone Encounter (Signed)
D:  Pt was referred by PHP.  A:  Placed call to pt to discuss MH-IOP with pt.  Due to scheduling conflicts and an upcoming trip to [place]; pt is declining at this time.  According to pt, she is wanting medication management only.  Pt states she has an upcoming new pt appointment with Dr. Lilia Pro in [place].  Encouraged pt to call writer if she decides she would like to try MH-IOP at a later time.  R:  Pt receptive.     Dellia Nims, M.Ed,CNA

## 2017-11-02 NOTE — Telephone Encounter (Signed)
Whitney will call pt to assess for PHP or IOP need and then schedule appt.

## 2017-11-03 ENCOUNTER — Other Ambulatory Visit: Payer: Self-pay | Admitting: Family Medicine

## 2017-11-04 DIAGNOSIS — Z955 Presence of coronary angioplasty implant and graft: Secondary | ICD-10-CM | POA: Diagnosis not present

## 2017-11-05 DIAGNOSIS — Z955 Presence of coronary angioplasty implant and graft: Secondary | ICD-10-CM | POA: Diagnosis not present

## 2017-11-06 DIAGNOSIS — M47816 Spondylosis without myelopathy or radiculopathy, lumbar region: Secondary | ICD-10-CM | POA: Diagnosis not present

## 2017-11-06 DIAGNOSIS — M419 Scoliosis, unspecified: Secondary | ICD-10-CM | POA: Diagnosis not present

## 2017-11-09 DIAGNOSIS — Z955 Presence of coronary angioplasty implant and graft: Secondary | ICD-10-CM | POA: Diagnosis not present

## 2017-11-10 ENCOUNTER — Encounter: Payer: Self-pay | Admitting: Physician Assistant

## 2017-11-10 ENCOUNTER — Encounter (HOSPITAL_COMMUNITY): Payer: Self-pay | Admitting: Psychiatry

## 2017-11-10 ENCOUNTER — Ambulatory Visit (INDEPENDENT_AMBULATORY_CARE_PROVIDER_SITE_OTHER): Payer: BLUE CROSS/BLUE SHIELD | Admitting: Psychiatry

## 2017-11-10 VITALS — BP 124/72 | HR 74 | Ht <= 58 in | Wt 112.0 lb

## 2017-11-10 DIAGNOSIS — F3342 Major depressive disorder, recurrent, in full remission: Secondary | ICD-10-CM | POA: Diagnosis not present

## 2017-11-10 DIAGNOSIS — F411 Generalized anxiety disorder: Secondary | ICD-10-CM | POA: Diagnosis not present

## 2017-11-10 DIAGNOSIS — F5102 Adjustment insomnia: Secondary | ICD-10-CM | POA: Diagnosis not present

## 2017-11-10 DIAGNOSIS — F331 Major depressive disorder, recurrent, moderate: Secondary | ICD-10-CM | POA: Diagnosis not present

## 2017-11-10 MED ORDER — DESVENLAFAXINE SUCCINATE ER 50 MG PO TB24: ORAL_TABLET | ORAL | 0 refills | Status: DC

## 2017-11-10 MED ORDER — CLONAZEPAM 0.5 MG PO TABS: 0.5000 mg | ORAL_TABLET | Freq: Every day | ORAL | 0 refills | Status: DC

## 2017-11-10 MED ORDER — BUSPIRONE HCL 15 MG PO TABS: 15.0000 mg | ORAL_TABLET | Freq: Every day | ORAL | 0 refills | Status: DC

## 2017-11-10 MED ORDER — MIRTAZAPINE 7.5 MG PO TABS: 7.5000 mg | ORAL_TABLET | Freq: Every day | ORAL | 0 refills | Status: DC

## 2017-11-10 MED ORDER — DESVENLAFAXINE SUCCINATE ER 100 MG PO TB24: ORAL_TABLET | ORAL | 0 refills | Status: DC
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
## 2017-11-10 NOTE — Patient Instructions (Signed)
Discharge patient as of her own request

## 2017-11-16 DIAGNOSIS — M47816 Spondylosis without myelopathy or radiculopathy, lumbar region: Secondary | ICD-10-CM | POA: Diagnosis not present

## 2017-11-17 ENCOUNTER — Encounter: Payer: Self-pay | Admitting: Physician Assistant

## 2017-11-24 ENCOUNTER — Encounter: Payer: Self-pay | Admitting: Physician Assistant

## 2017-11-24 ENCOUNTER — Ambulatory Visit (HOSPITAL_COMMUNITY): Payer: Self-pay | Admitting: Licensed Clinical Social Worker

## 2017-11-24 DIAGNOSIS — E039 Hypothyroidism, unspecified: Secondary | ICD-10-CM

## 2017-11-24 DIAGNOSIS — F333 Major depressive disorder, recurrent, severe with psychotic symptoms: Secondary | ICD-10-CM | POA: Diagnosis not present

## 2017-11-26 ENCOUNTER — Encounter: Payer: Self-pay | Admitting: *Deleted

## 2017-11-26 ENCOUNTER — Ambulatory Visit (HOSPITAL_COMMUNITY): Payer: Self-pay | Admitting: Licensed Clinical Social Worker

## 2017-11-26 DIAGNOSIS — E039 Hypothyroidism, unspecified: Secondary | ICD-10-CM | POA: Diagnosis not present

## 2017-11-26 LAB — TSH: TSH: 0.7 mIU/L (ref 0.40–4.50)

## 2017-11-29 ENCOUNTER — Encounter: Payer: Self-pay | Admitting: Physician Assistant

## 2017-12-01 ENCOUNTER — Encounter: Payer: Self-pay | Admitting: Physician Assistant

## 2017-12-01 ENCOUNTER — Other Ambulatory Visit: Payer: Self-pay | Admitting: Physician Assistant

## 2017-12-01 MED ORDER — LEVOTHYROXINE SODIUM 125 MCG PO TABS: 62.5000 ug | ORAL_TABLET | Freq: Every day | ORAL | 1 refills | Status: DC

## 2017-12-07 ENCOUNTER — Other Ambulatory Visit: Payer: Self-pay

## 2017-12-07 DIAGNOSIS — R202 Paresthesia of skin: Secondary | ICD-10-CM

## 2017-12-07 MED ORDER — GABAPENTIN 300 MG PO CAPS: ORAL_CAPSULE | ORAL | 3 refills | Status: DC

## 2017-12-09 DIAGNOSIS — E785 Hyperlipidemia, unspecified: Secondary | ICD-10-CM | POA: Diagnosis not present

## 2017-12-10 LAB — HEPATIC FUNCTION PANEL
ALT: 37 IU/L — ABNORMAL HIGH (ref 0–32)
AST: 28 IU/L (ref 0–40)
Albumin: 4.4 g/dL (ref 3.6–4.8)
Alkaline Phosphatase: 73 IU/L (ref 39–117)
BILIRUBIN TOTAL: 0.3 mg/dL (ref 0.0–1.2)
BILIRUBIN, DIRECT: 0.1 mg/dL (ref 0.00–0.40)
TOTAL PROTEIN: 6.5 g/dL (ref 6.0–8.5)

## 2017-12-10 LAB — LIPID PANEL
CHOL/HDL RATIO: 1.9 ratio (ref 0.0–4.4)
Cholesterol, Total: 121 mg/dL (ref 100–199)
HDL: 64 mg/dL (ref >39)
LDL Calculated: 46 mg/dL (ref 0–99)
Triglycerides: 54 mg/dL (ref 0–149)
VLDL Cholesterol Cal: 11 mg/dL (ref 5–40)

## 2017-12-14 ENCOUNTER — Other Ambulatory Visit: Payer: Self-pay | Admitting: Physician Assistant

## 2017-12-14 DIAGNOSIS — K2100 Gastro-esophageal reflux disease with esophagitis, without bleeding: Secondary | ICD-10-CM

## 2018-01-01 ENCOUNTER — Encounter: Payer: Self-pay | Admitting: Physician Assistant

## 2018-01-01 ENCOUNTER — Ambulatory Visit (INDEPENDENT_AMBULATORY_CARE_PROVIDER_SITE_OTHER): Payer: BLUE CROSS/BLUE SHIELD | Admitting: Physician Assistant

## 2018-01-01 ENCOUNTER — Telehealth: Payer: Self-pay | Admitting: Physician Assistant

## 2018-01-01 VITALS — BP 102/64 | HR 72 | Wt 113.0 lb

## 2018-01-01 DIAGNOSIS — K2100 Gastro-esophageal reflux disease with esophagitis, without bleeding: Secondary | ICD-10-CM

## 2018-01-01 DIAGNOSIS — M810 Age-related osteoporosis without current pathological fracture: Secondary | ICD-10-CM

## 2018-01-01 DIAGNOSIS — E039 Hypothyroidism, unspecified: Secondary | ICD-10-CM | POA: Diagnosis not present

## 2018-01-01 DIAGNOSIS — I25119 Atherosclerotic heart disease of native coronary artery with unspecified angina pectoris: Secondary | ICD-10-CM | POA: Diagnosis not present

## 2018-01-01 DIAGNOSIS — J3489 Other specified disorders of nose and nasal sinuses: Secondary | ICD-10-CM

## 2018-01-01 MED ORDER — LEVOTHYROXINE SODIUM 125 MCG PO TABS: 62.5000 ug | ORAL_TABLET | Freq: Every day | ORAL | 1 refills | Status: DC

## 2018-01-01 MED ORDER — AZELASTINE HCL 0.1 % NA SOLN: 1.0000 | Freq: Two times a day (BID) | NASAL | 2 refills | Status: DC

## 2018-01-01 NOTE — Progress Notes (Signed)
Subjective:    Patient ID: Leslie Roberson, female    DOB: [birth year], 62 y.o.   MRN: [id number]  HPI  Pt is a 63 yo female with hypothyroidism, CAD, Asthma, depression, anxiety, osteoporosis who presents to the clinic for medication refill.   Pt does note that she has switched physiatrist to Dr. Lilia Pro.   Overall patient feels great. No problems or concerns. Taking medication as directed.   She does have some clear runny nose off and on. No real congestion. Seems to be after she eats some but can be any time.   .. Active Ambulatory Problems    Diagnosis Date Noted  . GERD (gastroesophageal reflux disease) 11/27/2015  . Osteoporosis 11/27/2015  . GAD (generalized anxiety disorder) 11/27/2015  . MDD (major depressive disorder), recurrent, in full remission ([place]) 11/27/2015  . Chronic dryness of both eyes 11/27/2015  . Hypothyroidism 11/27/2015  . Asthma, chronic 11/27/2015  . Insomnia 11/27/2015  . Seborrheic keratoses 11/27/2015  . Diverticulitis of colon 12/21/2015  . Hip bursitis 12/25/2015  . Biceps tendonitis on right 12/25/2015  . Idiopathic scoliosis 01/22/2016  . Vaginal atrophy 01/31/2016  . Rosacea 02/04/2016  . Right knee pain 02/19/2016  . Paresthesia 04/03/2016  . Facet hypertrophy of lumbosacral region 04/03/2016  . Chronic back pain 04/03/2016  . Bad taste in mouth 07/26/2016  . No energy 09/15/2016  . Lump in throat 01/27/2017  . Dysphagia 01/27/2017  . Chronic tension-type headache, intractable 02/05/2017  . Prediabetes 02/05/2017  . Right leg pain 02/06/2017  . Atypical chest pain 05/24/2017  . Itchy scalp 05/24/2017  . Pneumothorax 06/25/2017  . Angina, class III ([place]) 07/07/2017  . Coronary artery disease involving native coronary artery of native heart with angina pectoris ([place])   . Tongue mass 08/14/2017  . Excessive sweating 09/22/2017  . Rhinorrhea 01/04/2018   Resolved Ambulatory Problems    Diagnosis Date Noted  . Acute medial  meniscus tear 04/23/2016  . Left knee pain 04/28/2016  . Mild intermittent asthma without complication 09/47/0962   Past Medical History:  Diagnosis Date  . Anxiety   . Arthritis   . Coronary artery disease   . Depression   . History of blood transfusion   . Pneumonia 2016  . Pre-diabetes   . Thyroid disease      Review of Systems  All other systems reviewed and are negative.      Objective:   Physical Exam  Constitutional: She is oriented to person, place, and time. She appears well-developed and well-nourished.  HENT:  Head: Normocephalic and atraumatic.  Neck: No thyromegaly present.  Cardiovascular: Normal rate and regular rhythm.  Pulmonary/Chest: Effort normal and breath sounds normal.  Musculoskeletal:  Scolosis.   Neurological: She is alert and oriented to person, place, and time.  Psychiatric: She has a normal mood and affect.          Assessment & Plan:  Marland KitchenMarland KitchenSmita was seen today for follow-up.  Diagnoses and all orders for this visit:  Coronary artery disease involving native coronary artery of native heart with angina pectoris (HCC)  Gastroesophageal reflux disease with esophagitis  Acquired hypothyroidism -     levothyroxine (SYNTHROID, LEVOTHROID) 125 MCG tablet; Take 0.5 tablets (62.5 mcg total) by mouth daily.  Age-related osteoporosis without current pathological fracture  Post-nasal drip  Rhinorrhea -     azelastine (ASTELIN) 0.1 % nasal spray; Place 1 spray into both nostrils 2 (two) times daily. Use in each nostril as directed  TSH was checked recently by another provider and in normal range with patient feeling fine. Refilled for 6 months.   Rhinorrhea is clear. Given astelin to try as needed. Could be related to food. Watch out for triggers.   BP is great and on a statin.   Pt is late for prolia. Last injection 05/22/17. Will send msg to [facility] to get ordered and on schedule. Bone density to get with mammogram.

## 2018-01-01 NOTE — Telephone Encounter (Signed)
Past due for prolia 05/22/17 last injection can we get approved and scheduled.

## 2018-01-01 NOTE — Telephone Encounter (Signed)
Prolia ordered. Routing to see if PA is required.

## 2018-01-04 ENCOUNTER — Encounter: Payer: Self-pay | Admitting: Physician Assistant

## 2018-01-04 DIAGNOSIS — M47816 Spondylosis without myelopathy or radiculopathy, lumbar region: Secondary | ICD-10-CM | POA: Diagnosis not present

## 2018-01-04 DIAGNOSIS — F333 Major depressive disorder, recurrent, severe with psychotic symptoms: Secondary | ICD-10-CM | POA: Diagnosis not present

## 2018-01-04 DIAGNOSIS — J3489 Other specified disorders of nose and nasal sinuses: Secondary | ICD-10-CM | POA: Insufficient documentation

## 2018-01-04 NOTE — Telephone Encounter (Signed)
Information has been sent to insurance and waiting on approval.

## 2018-01-11 NOTE — Telephone Encounter (Signed)
Called insurance at [phone] to check on the status of the PA and additional information was required and it has been sent to pharmacist for review.

## 2018-01-11 NOTE — Telephone Encounter (Signed)
Recieved fax from [place] that [place] has been approved from 01/11/2018 through 01/11/2019. Form sent to scan. -hsm.

## 2018-01-12 DIAGNOSIS — M47816 Spondylosis without myelopathy or radiculopathy, lumbar region: Secondary | ICD-10-CM | POA: Diagnosis not present

## 2018-01-14 ENCOUNTER — Ambulatory Visit (INDEPENDENT_AMBULATORY_CARE_PROVIDER_SITE_OTHER): Payer: BLUE CROSS/BLUE SHIELD | Admitting: Family Medicine

## 2018-01-14 ENCOUNTER — Encounter: Payer: Self-pay | Admitting: Family Medicine

## 2018-01-14 VITALS — BP 120/54 | HR 74 | Ht <= 58 in | Wt 116.0 lb

## 2018-01-14 DIAGNOSIS — R202 Paresthesia of skin: Secondary | ICD-10-CM | POA: Diagnosis not present

## 2018-01-14 DIAGNOSIS — G4489 Other headache syndrome: Secondary | ICD-10-CM

## 2018-01-14 MED ORDER — TOPIRAMATE 50 MG PO TABS: ORAL_TABLET | ORAL | 1 refills | Status: DC

## 2018-01-14 MED ORDER — GABAPENTIN 300 MG PO CAPS: ORAL_CAPSULE | ORAL | 3 refills | Status: DC

## 2018-01-14 NOTE — Progress Notes (Signed)
Leslie Roberson is a 62 y.o. female who presents to [facility]: [place] today for headache.  Leslie Roberson notes a 12-month history of chronic headache.  Headache initially was intermittent occurring a few times a month and slowly has been worsening to becoming almost daily and persistent.  She uses Tylenol infrequently and notes that it does not help much.  She denies any head injury weakness numbness radiating pain.  She denies any visual aura or personal history of migraines.  She notes she typically does not have headaches until a few months ago.  There is a pertinent medical history for significant scoliosis.  She takes medications listed below but notes that none of them are particularly new.  ROS as above:  Exam:  BP (!) 120/54   Pulse 74   Ht 4\' 10"  (1.473 m)   Wt 116 lb (52.6 kg)   BMI 24.24 kg/m  Wt Readings from Last 5 Encounters:  01/14/18 116 lb (52.6 kg)  01/01/18 113 lb (51.3 kg)  10/28/17 113 lb 6.4 oz (51.4 kg)  09/15/17 115 lb (52.2 kg)  08/13/17 120 lb (54.4 kg)    Gen: Well NAD HEENT: EOMI,  MMM normal tympanic membranes posterior pharynx bilaterally Lungs: Normal work of breathing. CTABL Heart: RRR no MRG Abd: NABS, Soft. Nondistended, Nontender Exts: Brisk capillary refill, warm and well perfused.  Neuro: Alert and oriented normal coordination balance and gait.  Cranial nerve exam is normal.  Upper and lower extremity strength is equal and normal throughout as are reflexes.  Normal balance gait and rapid alternating motion bilaterally.  Lab and Radiology Results No neuroimaging present upon review   Assessment and Plan: 62 y.o. female with  36-month history of slowly worsening headache becoming more frequent now chronic.  Unclear headache type suspect migraine.  Doubtful for medication overuse as patient only uses Tylenol infrequently.  Patient does  have multiple medications and it is possible headaches are due to the medications however I think this is less likely.  We discussed options.  Plan for work-up including MRI of the brain which should rule out masses or other intracranial structural abnormalities.  We will treat empirically for migraine prophylaxis with Topamax.  Follow-up with PCP in 1 month.  Return sooner if needed.  Of note gabapentin for paresthesia updated with different dosing instruction to reflect what patient is taking.  No prescription was sent. Orders Placed This Encounter  Procedures  . MR Brain Wo Contrast    Standing Status:   Future    Standing Expiration Date:   03/17/2019    Order Specific Question:   What is the patient's sedation requirement?    Answer:   No Sedation    Order Specific Question:   Does the patient have a pacemaker or implanted devices?    Answer:   No    Order Specific Question:   Preferred imaging location?    Answer:   Product/process development scientist (table limit-350lbs)    Order Specific Question:   Radiology Contrast Protocol - do NOT remove file path    Answer:   [email or web address]      Historical information moved to improve visibility of documentation.  Past Medical History:  Diagnosis Date  . Anxiety   . Arthritis    "right little finger; base of thumb left hand; spine" (07/07/2017)  . Asthma, chronic 11/27/2015  . Chronic back pain   . Coronary artery disease    4/19  PCI/DEx2 to LAD, and PCI/DES x1 to RCA, normal EF  . Depression   . Diverticulitis of colon 12/21/2015   Colonoscopy every 5 years/digestive health.   . GERD (gastroesophageal reflux disease) 11/27/2015  . History of blood transfusion    "related to spinal fusions"  . Hypothyroidism   . Idiopathic scoliosis 01/22/2016  . Osteoporosis 11/27/2015   On prolia.   . Pneumonia 2016  . Pneumothorax 06/26/2017  . Pre-diabetes   . Thyroid disease    Past Surgical History:  Procedure  Laterality Date  . ABDOMINAL HYSTERECTOMY    . bladder mesh     S/P bladder sling"  . CORONARY STENT INTERVENTION N/A 07/07/2017   Procedure: CORONARY STENT INTERVENTION;  Surgeon: Leonie Man, MD;  Location: [facility];  Service: Cardiovascular;  Laterality: N/A;  . ELBOW SURGERY Right    "dislocation"  . INCONTINENCE SURGERY     "didn't work"  . INTRAVASCULAR PRESSURE WIRE/FFR STUDY N/A 07/07/2017   Procedure: INTRAVASCULAR PRESSURE WIRE/FFR STUDY;  Surgeon: Leonie Man, MD;  Location: [facility];  Service: Cardiovascular;  Laterality: N/A;  . JOINT REPLACEMENT    . LEFT HEART CATH AND CORONARY ANGIOGRAPHY N/A 07/07/2017   Procedure: LEFT HEART CATH AND CORONARY ANGIOGRAPHY;  Surgeon: Leonie Man, MD;  Location: [facility];  Service: Cardiovascular;  Laterality: N/A;  . SPINAL FUSION  ~ 1970 X 3   "below neck - lower back"  . TONSILLECTOMY    . TOTAL HIP ARTHROPLASTY Left    Social History   Tobacco Use  . Smoking status: Never Smoker  . Smokeless tobacco: Never Used  Substance Use Topics  . Alcohol use: Yes    Comment: 07/07/2017 "5-6 times/year; 1 drink at each occasion"   family history includes COPD in her paternal aunt; Cancer in her mother; Depression in her mother; Diabetes in her maternal aunt; Heart attack in her father; Hyperlipidemia in her father and paternal aunt; Hypertension in her father.  Medications: Current Outpatient Medications  Medication Sig Dispense Refill  . albuterol (PROVENTIL HFA;VENTOLIN HFA) 108 (90 Base) MCG/ACT inhaler Inhale 1-2 puffs into the lungs every 4 (four) hours as needed for wheezing or shortness of breath. 1 Inhaler 3  . aspirin EC 81 MG tablet Take 81 mg by mouth at bedtime.    Marland Kitchen atorvastatin (LIPITOR) 20 MG tablet Take 1 tablet (20 mg total) by mouth daily. 90 tablet 3  . azelastine (ASTELIN) 0.1 % nasal spray Place 1 spray into both nostrils 2 (two) times daily. Use in each nostril as directed 30 mL 2    . busPIRone (BUSPAR) 15 MG tablet Take 1 tablet (15 mg total) by mouth daily. 30 tablet 0  . calcium-vitamin D (OSCAL WITH D) 500-200 MG-UNIT tablet Take 1 tablet by mouth daily.    . clonazePAM (KLONOPIN) 0.5 MG tablet Take 1 tablet (0.5 mg total) by mouth at bedtime. 30 tablet 0  . clopidogrel (PLAVIX) 75 MG tablet Take 1 tablet (75 mg total) by mouth daily with breakfast. 90 tablet 2  . denosumab (PROLIA) 60 MG/ML SOLN injection Inject 60 mg into the skin every 6 (six) months. Administer in upper arm, thigh, or abdomen    . desvenlafaxine (PRISTIQ) 100 MG 24 hr tablet Take 100 mg by mouth at bedtime 30 tablet 0  . desvenlafaxine (PRISTIQ) 50 MG 24 hr tablet TAKE 1 TABLET BY MOUTH AT BEDTIME 90 tablet 1  . diclofenac sodium (VOLTAREN) 1 %  GEL APPLY 4G TOPICALLY 4 TIMES DAILY (Patient taking differently: Apply 4 g topically 4 (four) times daily as needed (to affected painful sites). ) 100 g 12  . gabapentin (NEURONTIN) 300 MG capsule 1 po daily and 2 po qhs 180 capsule 3  . levothyroxine (SYNTHROID, LEVOTHROID) 125 MCG tablet Take 0.5 tablets (62.5 mcg total) by mouth daily. 90 tablet 1  . lidocaine (LIDODERM) 5 % Place 1 patch onto the skin daily. Remove & Discard patch within 12 hours or as directed by MD (Patient taking differently: Place 1 patch onto the skin daily as needed (for pain). Remove & Discard patch within 12 hours or as directed by MD) 30 patch 12  . Menthol, Topical Analgesic, (MINERAL ICE EX) Apply 1 application to affected sites one to two times a day as needed (for pain)    . metoprolol succinate (TOPROL XL) 25 MG 24 hr tablet Take 1 tablet (25 mg total) by mouth daily. 90 tablet 3  . nitroGLYCERIN (NITROSTAT) 0.4 MG SL tablet PLACE 1 TABLET (0.4 MG TOTAL) UNDER THE TONGUE EVERY 5 (FIVE) MINUTES AS NEEDED. 25 tablet 2  . Omega-3 Fatty Acids (FISH OIL PO) Take 1 capsule by mouth daily.     . pantoprazole (PROTONIX) 40 MG tablet TAKE 1 TABLET BY MOUTH EVERY DAY 90 tablet 1  .  traZODone (DESYREL) 50 MG tablet TAKE 1 TABLET BY MOUTH EVERYDAY AT BEDTIME 90 tablet 1  . topiramate (TOPAMAX) 50 MG tablet One half tab by mouth daily for a week, then one tab by mouth daily. 30 tablet 1   No current facility-administered medications for this visit.    Allergies  Allergen Reactions  . Hydromorphone Rash  . Levofloxacin Other (See Comments)    Hallucinations  . Meperidine Nausea And Vomiting and Other (See Comments)    Also "doesn't work well for my pain"  . Broccoli [Brassica Oleracea Italica] Nausea Only and Other (See Comments)    Causes stomach pain also  . Adhesive [Tape] Rash     Discussed warning signs or symptoms. Please see discharge instructions. Patient expresses understanding.

## 2018-01-14 NOTE — Patient Instructions (Signed)
Thank you for coming in today. You should hear about MRI.  Follow up with Jade in 1 month.  Start topamax.  Return sooner if needed.    Migraine Headache A migraine headache is an intense, throbbing pain on one side or both sides of the head. Migraines may also cause other symptoms, such as nausea, vomiting, and sensitivity to light and noise. What are the causes? Doing or taking certain things may also trigger migraines, such as:  Alcohol.  Smoking.  Medicines, such as: ? Medicine used to treat chest pain (nitroglycerine). ? Birth control pills. ? Estrogen pills. ? Certain blood pressure medicines.  Aged cheeses, chocolate, or caffeine.  Foods or drinks that contain nitrates, glutamate, aspartame, or tyramine.  Physical activity.  Other things that may trigger a migraine include:  Menstruation.  Pregnancy.  Hunger.  Stress, lack of sleep, too much sleep, or fatigue.  Weather changes.  What increases the risk? The following factors may make you more likely to experience migraine headaches:  Age. Risk increases with age.  Family history of migraine headaches.  Being Caucasian.  Depression and anxiety.  Obesity.  Being a woman.  Having a hole in the heart (patent foramen ovale) or other heart problems.  What are the signs or symptoms? The main symptom of this condition is pulsating or throbbing pain. Pain may:  Happen in any area of the head, such as on one side or both sides.  Interfere with daily activities.  Get worse with physical activity.  Get worse with exposure to bright lights or loud noises.  Other symptoms may include:  Nausea.  Vomiting.  Dizziness.  General sensitivity to bright lights, loud noises, or smells.  Before you get a migraine, you may get warning signs that a migraine is developing (aura). An aura may include:  Seeing flashing lights or having blind spots.  Seeing bright spots, halos, or zigzag lines.  Having  tunnel vision or blurred vision.  Having numbness or a tingling feeling.  Having trouble talking.  Having muscle weakness.  How is this diagnosed? A migraine headache can be diagnosed based on:  Your symptoms.  A physical exam.  Tests, such as CT scan or MRI of the head. These imaging tests can help rule out other causes of headaches.  Taking fluid from the spine (lumbar puncture) and analyzing it (cerebrospinal fluid analysis, or CSF analysis).  How is this treated? A migraine headache is usually treated with medicines that:  Relieve pain.  Relieve nausea.  Prevent migraines from coming back.  Treatment may also include:  Acupuncture.  Lifestyle changes like avoiding foods that trigger migraines.  Follow these instructions at home: Medicines  Take over-the-counter and prescription medicines only as told by your health care provider.  Do not drive or use heavy machinery while taking prescription pain medicine.  To prevent or treat constipation while you are taking prescription pain medicine, your health care provider may recommend that you: ? Drink enough fluid to keep your urine clear or pale yellow. ? Take over-the-counter or prescription medicines. ? Eat foods that are high in fiber, such as fresh fruits and vegetables, whole grains, and beans. ? Limit foods that are high in fat and processed sugars, such as fried and sweet foods. Lifestyle  Avoid alcohol use.  Do not use any products that contain nicotine or tobacco, such as cigarettes and e-cigarettes. If you need help quitting, ask your health care provider.  Get at least 8 hours of sleep every  night.  Limit your stress. General instructions   Keep a journal to find out what may trigger your migraine headaches. For example, write down: ? What you eat and drink. ? How much sleep you get. ? Any change to your diet or medicines.  If you have a migraine: ? Avoid things that make your symptoms worse,  such as bright lights. ? It may help to lie down in a dark, quiet room. ? Do not drive or use heavy machinery. ? Ask your health care provider what activities are safe for you while you are experiencing symptoms.  Keep all follow-up visits as told by your health care provider. This is important. Contact a health care provider if:  You develop symptoms that are different or more severe than your usual migraine symptoms. Get help right away if:  Your migraine becomes severe.  You have a fever.  You have a stiff neck.  You have vision loss.  Your muscles feel weak or like you cannot control them.  You start to lose your balance often.  You develop trouble walking.  You faint. This information is not intended to replace advice given to you by your health care provider. Make sure you discuss any questions you have with your health care provider. Document Released: 03/17/2005 Document Revised: 10/05/2015 Document Reviewed: 09/03/2015 Elsevier Interactive Patient Education  2017 [employer].   Topiramate tablets What is this medicine? TOPIRAMATE (toe PYRE a mate) is used to treat seizures in adults or children with epilepsy. It is also used for the prevention of migraine headaches. This medicine may be used for other purposes; ask your health care provider or pharmacist if you have questions. COMMON BRAND NAME(S): Topamax, Topiragen What should I tell my health care provider before I take this medicine? They need to know if you have any of these conditions: -bleeding disorders -cirrhosis of the liver or liver disease -diarrhea -glaucoma -kidney stones or kidney disease -low blood counts, like low white cell, platelet, or red cell counts -lung disease like asthma, obstructive pulmonary disease, emphysema -metabolic acidosis -on a ketogenic diet -schedule for surgery or a procedure -suicidal thoughts, plans, or attempt; a previous suicide attempt by you or a family  member -an unusual or allergic reaction to topiramate, other medicines, foods, dyes, or preservatives -pregnant or trying to get pregnant -breast-feeding How should I use this medicine? Take this medicine by mouth with a glass of water. Follow the directions on the prescription label. Do not crush or chew. You may take this medicine with meals. Take your medicine at regular intervals. Do not take it more often than directed. Talk to your pediatrician regarding the use of this medicine in children. Special care may be needed. While this drug may be prescribed for children as young as 63 years of age for selected conditions, precautions do apply. Overdosage: If you think you have taken too much of this medicine contact a poison control center or emergency room at once. NOTE: This medicine is only for you. Do not share this medicine with others. What if I miss a dose? If you miss a dose, take it as soon as you can. If your next dose is to be taken in less than 6 hours, then do not take the missed dose. Take the next dose at your regular time. Do not take double or extra doses. What may interact with this medicine? Do not take this medicine with any of the following medications: -probenecid This medicine may also  interact with the following medications: -acetazolamide -alcohol -amitriptyline -aspirin and aspirin-like medicines -birth control pills -certain medicines for depression -certain medicines for seizures -certain medicines that treat or prevent blood clots like warfarin, enoxaparin, dalteparin, apixaban, dabigatran, and rivaroxaban -digoxin -hydrochlorothiazide -lithium -medicines for pain, sleep, or muscle relaxation -metformin -methazolamide -NSAIDS, medicines for pain and inflammation, like ibuprofen or naproxen -pioglitazone -risperidone This list may not describe all possible interactions. Give your health care provider a list of all the medicines, herbs, non-prescription  drugs, or dietary supplements you use. Also tell them if you smoke, drink alcohol, or use illegal drugs. Some items may interact with your medicine. What should I watch for while using this medicine? Visit your doctor or health care professional for regular checks on your progress. Do not stop taking this medicine suddenly. This increases the risk of seizures if you are using this medicine to control epilepsy. Wear a medical identification bracelet or chain to say you have epilepsy or seizures, and carry a card that lists all your medicines. This medicine can decrease sweating and increase your body temperature. Watch for signs of deceased sweating or fever, especially in children. Avoid extreme heat, hot baths, and saunas. Be careful about exercising, especially in hot weather. Contact your health care provider right away if you notice a fever or decrease in sweating. You should drink plenty of fluids while taking this medicine. If you have had kidney stones in the past, this will help to reduce your chances of forming kidney stones. If you have stomach pain, with nausea or vomiting and yellowing of your eyes or skin, call your doctor immediately. You may get drowsy, dizzy, or have blurred vision. Do not drive, use machinery, or do anything that needs mental alertness until you know how this medicine affects you. To reduce dizziness, do not sit or stand up quickly, especially if you are an older patient. Alcohol can increase drowsiness and dizziness. Avoid alcoholic drinks. If you notice blurred vision, eye pain, or other eye problems, seek medical attention at once for an eye exam. The use of this medicine may increase the chance of suicidal thoughts or actions. Pay special attention to how you are responding while on this medicine. Any worsening of mood, or thoughts of suicide or dying should be reported to your health care professional right away. This medicine may increase the chance of developing  metabolic acidosis. If left untreated, this can cause kidney stones, bone disease, or slowed growth in children. Symptoms include breathing fast, fatigue, loss of appetite, irregular heartbeat, or loss of consciousness. Call your doctor immediately if you experience any of these side effects. Also, tell your doctor about any surgery you plan on having while taking this medicine since this may increase your risk for metabolic acidosis. Birth control pills may not work properly while you are taking this medicine. Talk to your doctor about using an extra method of birth control. Women who become pregnant while using this medicine may enroll in the [place] Pregnancy Registry by calling [phone]. This registry collects information about the safety of antiepileptic drug use during pregnancy. What side effects may I notice from receiving this medicine? Side effects that you should report to your doctor or health care professional as soon as possible: -allergic reactions like skin rash, itching or hives, swelling of the face, lips, or tongue -decreased sweating and/or rise in body temperature -depression -difficulty breathing, fast or irregular breathing patterns -difficulty speaking -difficulty walking or controlling muscle movements -  hearing impairment -redness, blistering, peeling or loosening of the skin, including inside the mouth -tingling, pain or numbness in the hands or feet -unusual bleeding or bruising -unusually weak or tired -worsening of mood, thoughts or actions of suicide or dying Side effects that usually do not require medical attention (report to your doctor or health care professional if they continue or are bothersome): -altered taste -back pain, joint or muscle aches and pains -diarrhea, or constipation -headache -loss of appetite -nausea -stomach upset, indigestion -tremors This list may not describe all possible side effects. Call your doctor  for medical advice about side effects. You may report side effects to FDA at 1-800-FDA-1088. Where should I keep my medicine? Keep out of the reach of children. Store at room temperature between 15 and 30 degrees C (59 and 86 degrees F) in a tightly closed container. Protect from moisture. Throw away any unused medicine after the expiration date. NOTE: This sheet is a summary. It may not cover all possible information. If you have questions about this medicine, talk to your doctor, pharmacist, or health care provider.  2018 Elsevier/Gold Standard (2013-03-21 23:17:57)

## 2018-01-15 ENCOUNTER — Ambulatory Visit (INDEPENDENT_AMBULATORY_CARE_PROVIDER_SITE_OTHER): Payer: BLUE CROSS/BLUE SHIELD

## 2018-01-15 ENCOUNTER — Other Ambulatory Visit: Payer: Self-pay | Admitting: Physician Assistant

## 2018-01-15 ENCOUNTER — Ambulatory Visit (INDEPENDENT_AMBULATORY_CARE_PROVIDER_SITE_OTHER): Payer: BLUE CROSS/BLUE SHIELD | Admitting: Sports Medicine

## 2018-01-15 VITALS — Temp 98.3°F

## 2018-01-15 DIAGNOSIS — Z1231 Encounter for screening mammogram for malignant neoplasm of breast: Secondary | ICD-10-CM

## 2018-01-15 DIAGNOSIS — M810 Age-related osteoporosis without current pathological fracture: Secondary | ICD-10-CM

## 2018-01-15 IMAGING — MG DIGITAL SCREENING BILATERAL MAMMOGRAM WITH TOMO AND CAD
6 of 10 series · 6 of 30 positions shown · non-contrast
Comparison: Previous exam(s).

CLINICAL DATA: Screening.

EXAM:
DIGITAL SCREENING BILATERAL MAMMOGRAM WITH TOMO AND CAD

[R CC synth-2D]
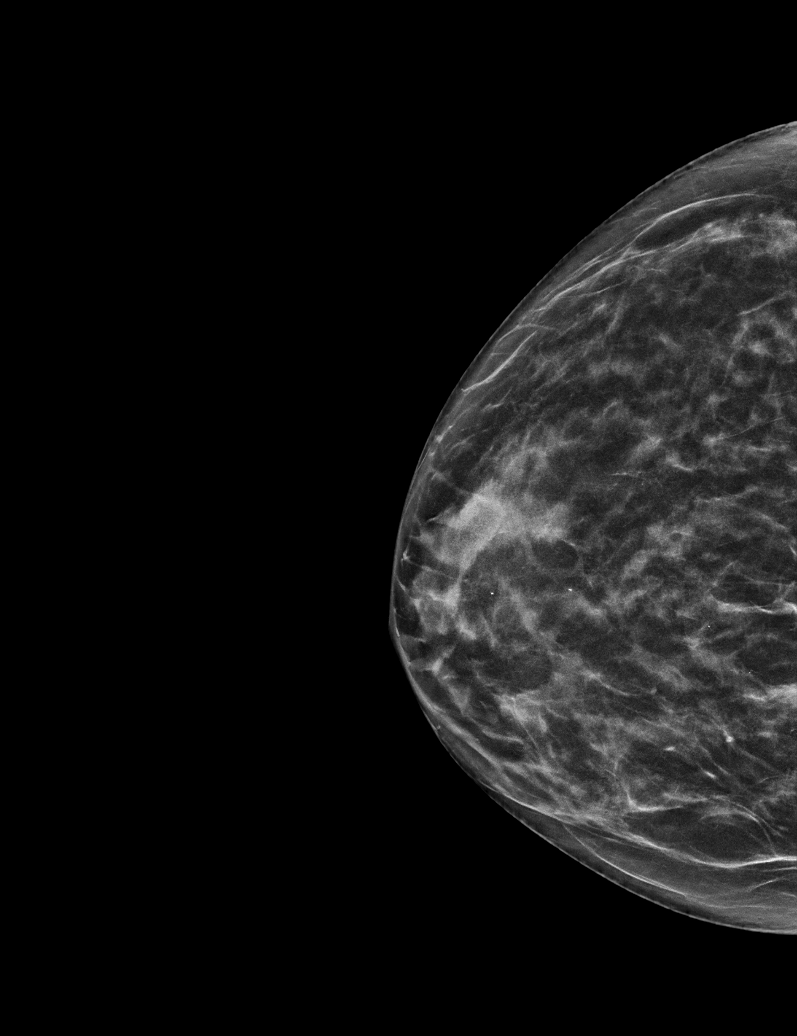

[L MLO synth-2D]
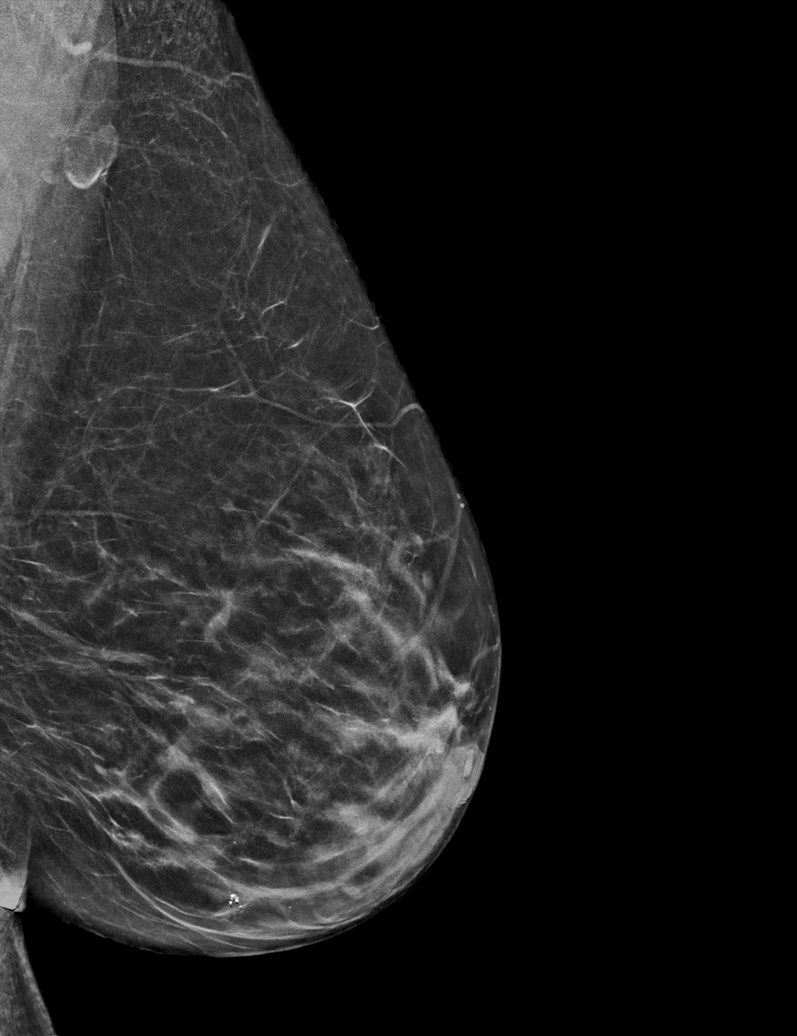

[R MLO synth-2D (1 of 2)]
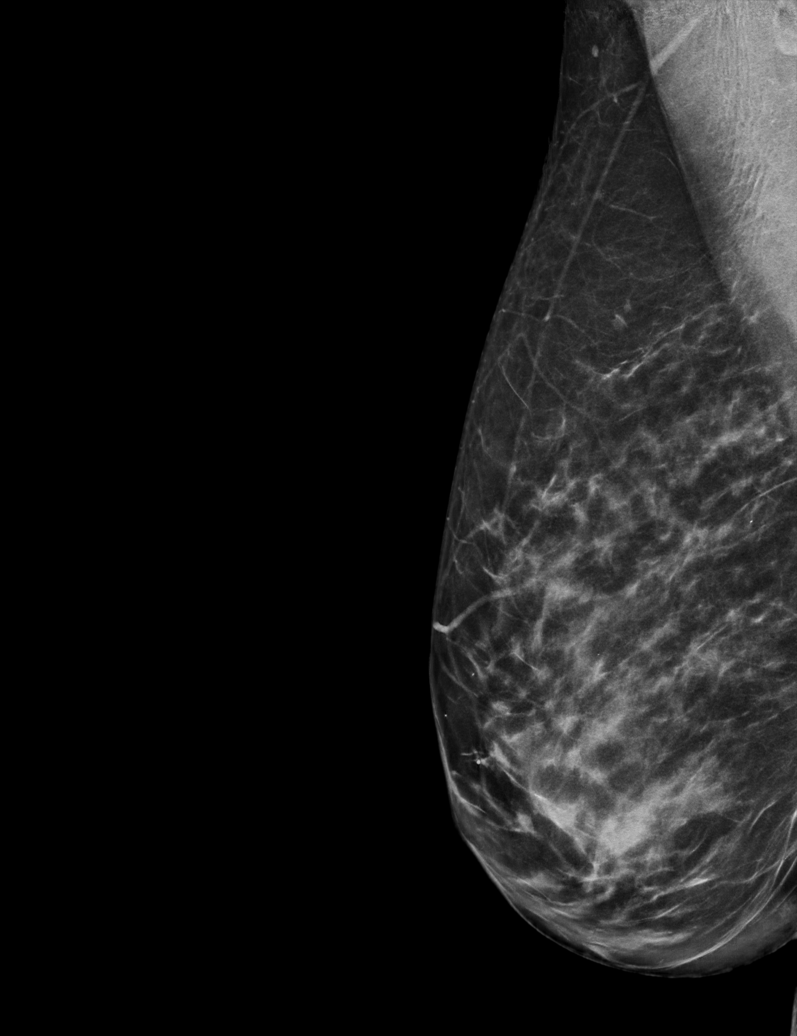

[R MLO synth-2D (2 of 2)]
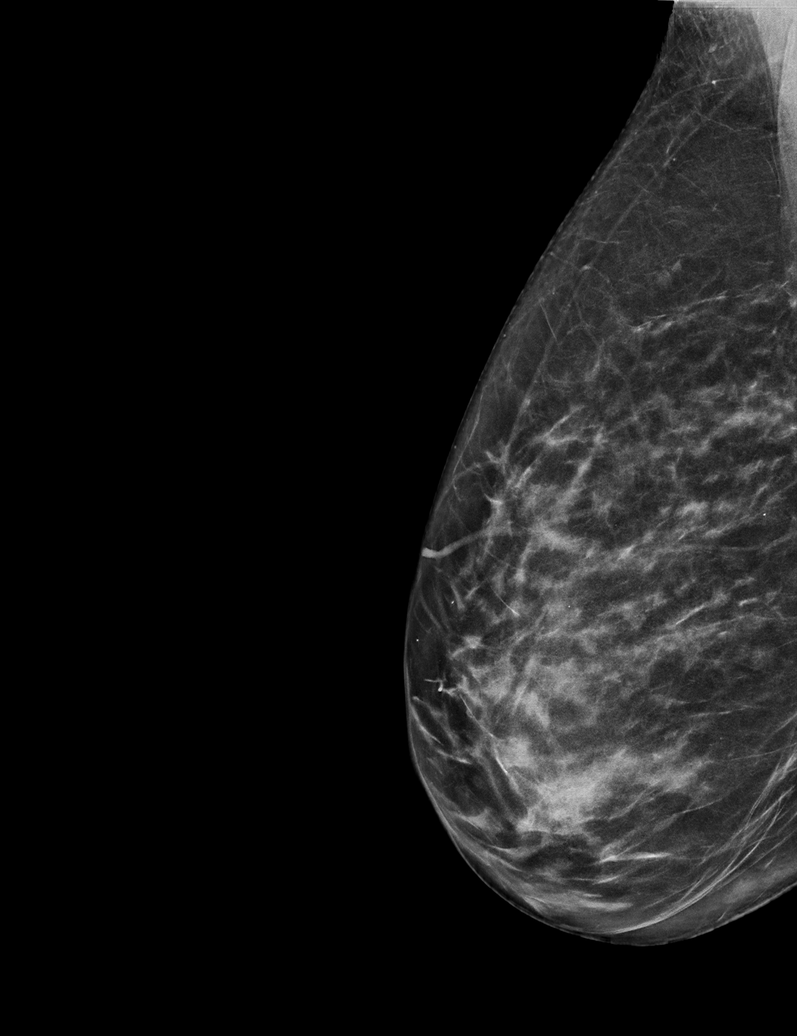

[L CC synth-2D]
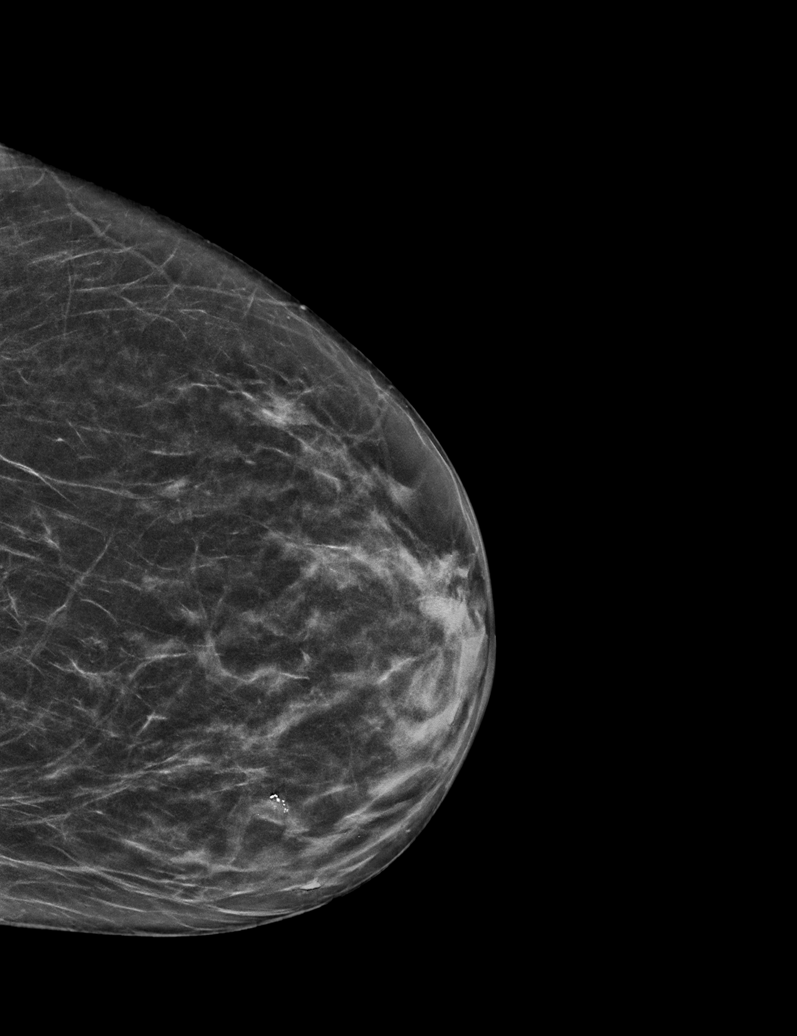

[R CC tomo · tomo slice 25/50.0]
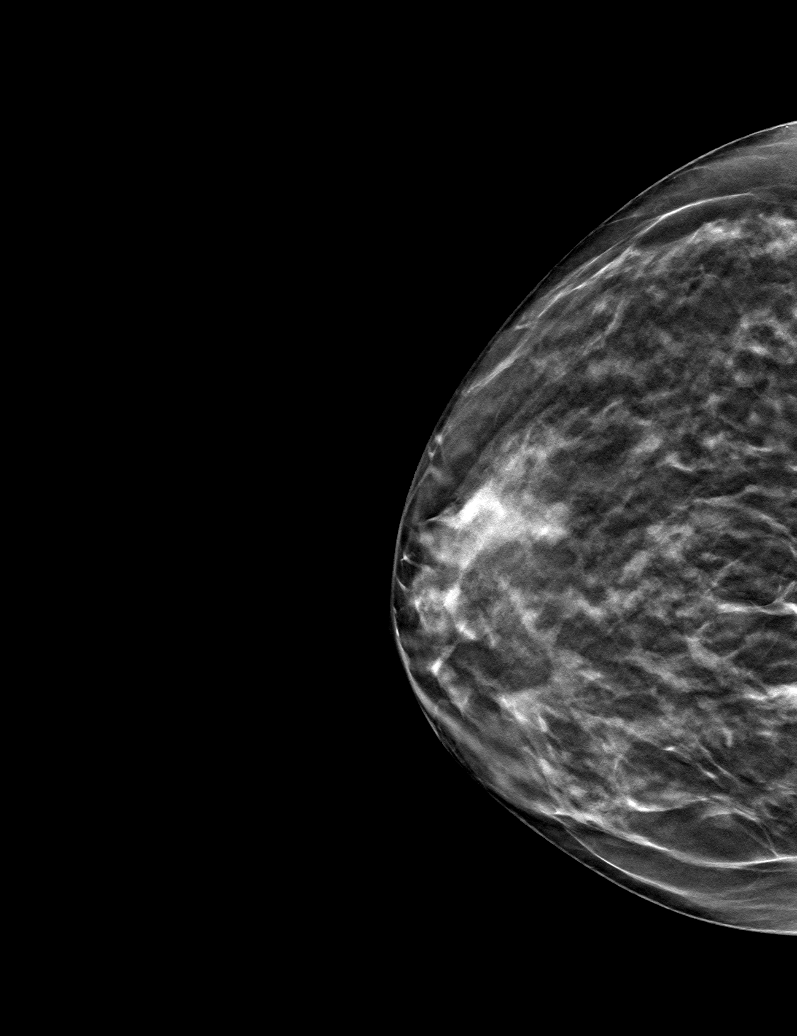

[6 of 30 positions shown; findings below may reference images not displayed]

ACR Breast Density Category c: The breast tissue is heterogeneously
dense, which may obscure small masses.
FINDINGS: There are no findings suspicious for malignancy. Images were
processed with CAD.
IMPRESSION: No mammographic evidence of malignancy. A result letter of this
screening mammogram will be mailed directly to the patient.

RECOMMENDATION:
Screening mammogram in one year. (Code:[id number])

BI-RADS CATEGORY  1: Negative.

## 2018-01-15 MED ORDER — DENOSUMAB 60 MG/ML ~~LOC~~ SOSY
60.0000 mg | PREFILLED_SYRINGE | Freq: Once | SUBCUTANEOUS | Status: AC
  Administered 2018-01-15: 60 mg via SUBCUTANEOUS

## 2018-01-15 NOTE — Progress Notes (Signed)
Patient presents to clinic today for her Prolia injection. Patient states she has had the Prolia injections before and does well with them. Patient denies any recent fevers or illnesses. Patient denies any complications in the past with the Prolia injection. Patient temperature today was 98.3 (oral). Prolia injection given in the left arm (subcutaneous) without complications. Advised patient that she will have to have updated lab work done. Advised patient that her next injection will be in 6 months. No further questions or concerns at this time. Casilda Carls, CMA.

## 2018-01-18 NOTE — Progress Notes (Signed)
Call pt: normal mammogram. Follow up in 1 year.

## 2018-01-20 ENCOUNTER — Telehealth: Payer: Self-pay | Admitting: Family Medicine

## 2018-01-20 NOTE — Telephone Encounter (Signed)
Completed peer to peer for brain MRI. Authorization #[id number] valid until November 16.  Radiology scheduled.

## 2018-02-01 ENCOUNTER — Ambulatory Visit (INDEPENDENT_AMBULATORY_CARE_PROVIDER_SITE_OTHER): Payer: BLUE CROSS/BLUE SHIELD

## 2018-02-01 DIAGNOSIS — G4489 Other headache syndrome: Secondary | ICD-10-CM | POA: Diagnosis not present

## 2018-02-01 DIAGNOSIS — F333 Major depressive disorder, recurrent, severe with psychotic symptoms: Secondary | ICD-10-CM | POA: Diagnosis not present

## 2018-02-01 DIAGNOSIS — R51 Headache: Secondary | ICD-10-CM | POA: Diagnosis not present

## 2018-02-01 IMAGING — MR MR HEAD W/O CM
8 series · 47 of 48 positions shown · non-contrast
Comparison: None.

CLINICAL DATA: Initial evaluation for recurrent headaches for 6
months.

EXAM:
MRI HEAD WITHOUT CONTRAST
TECHNIQUE: Multiplanar, multiecho pulse sequences of the brain and surrounding
structures were obtained without intravenous contrast.

[Series 2: T1 · sagittal · 5.0mm · 0.45mm/px · 3 of 23 slices shown (1 of 2)]
[im 1/23]
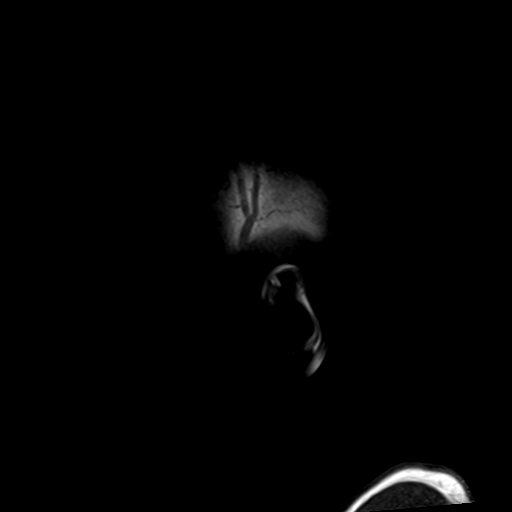
[im 12/23]
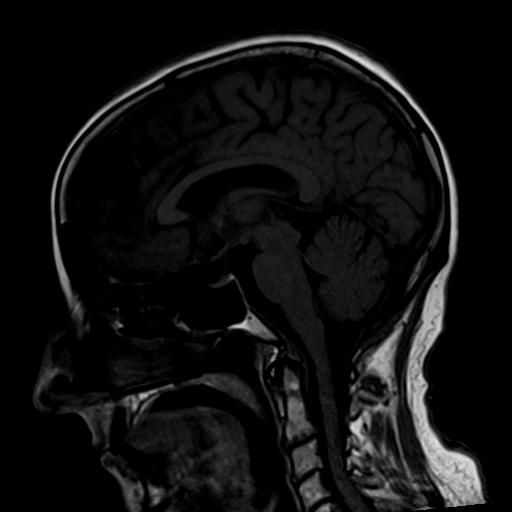
[im 23/23]
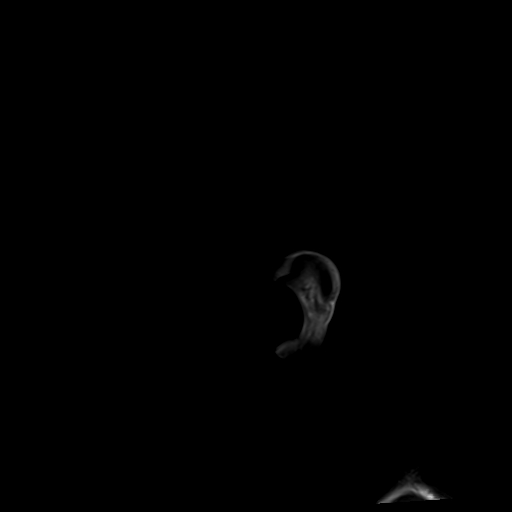

[Series 4: DWI · axial · 3.0mm · 1.20mm/px · z∈[-57,+102]mm · 6 of 53 slices shown (1 of 2)]
[im 1/53]
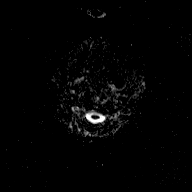
[im 11/53]
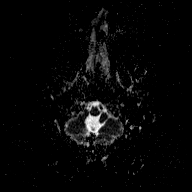
[im 21/53]
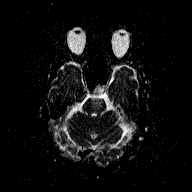
[im 32/53]
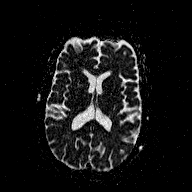
[im 42/53]
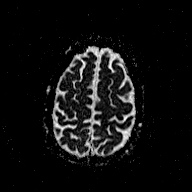
[im 53/53]
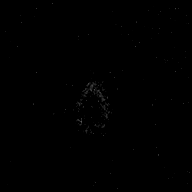

[Series 5: T2 · axial · 5.0mm · 0.72mm/px · z∈[-56,+101]mm · 3 of 24 slices shown (1 of 3)]
[im 1/24]
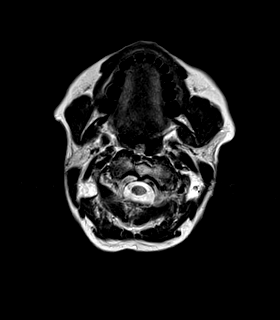
[im 12/24]
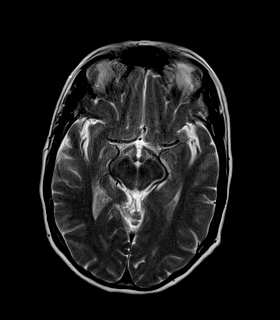
[im 24/24]
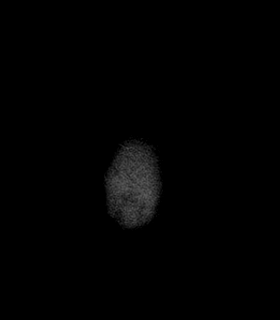

[Series 6: FLAIR · axial · 3.0mm · 0.45mm/px · z∈[-57,+102]mm · 6 of 55 slices shown]
[im 1/55]
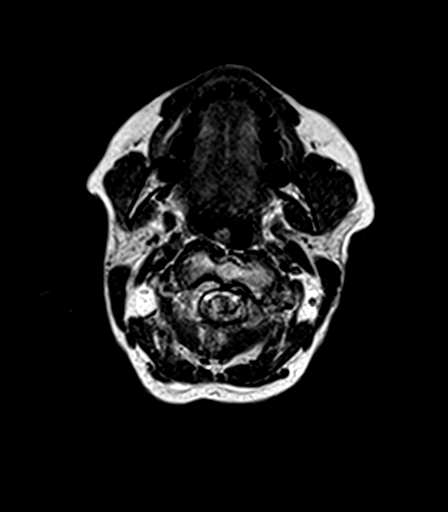
[im 11/55]
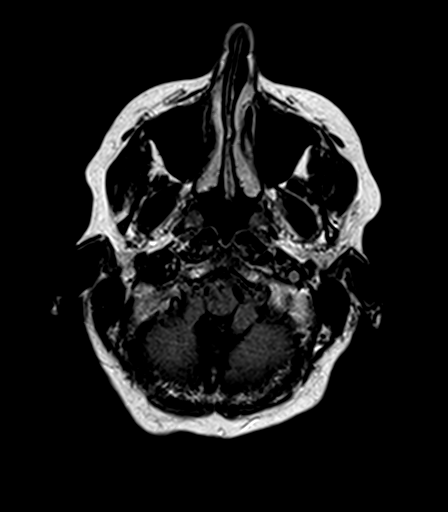
[im 22/55]
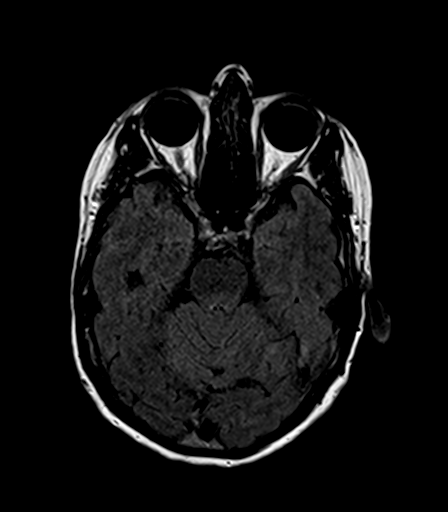
[im 33/55]
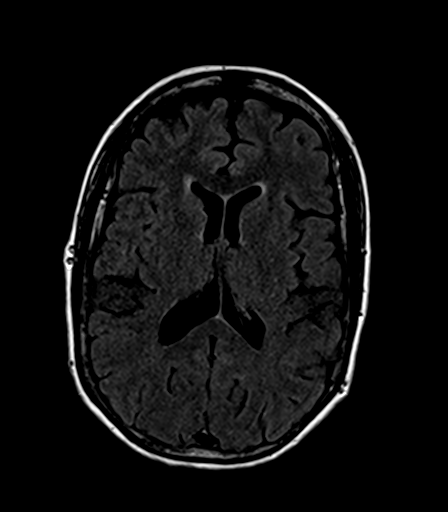
[im 44/55]
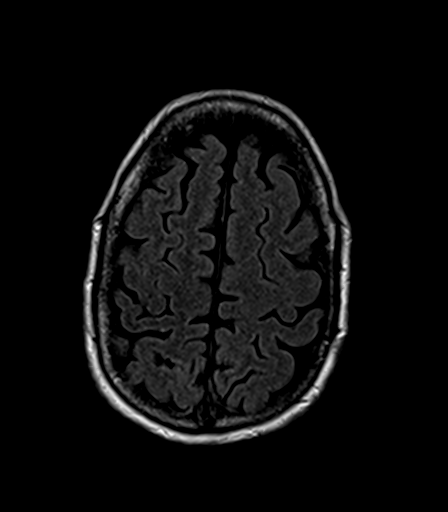
[im 55/55]
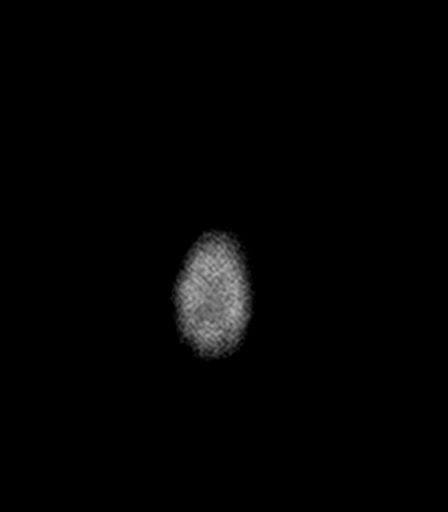

[Series 7: T2 · axial · 5.0mm · 0.72mm/px · z∈[-56,+101]mm · 3 of 24 slices shown (2 of 3)]
[im 1/24]
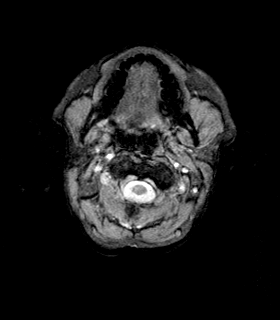
[im 12/24]
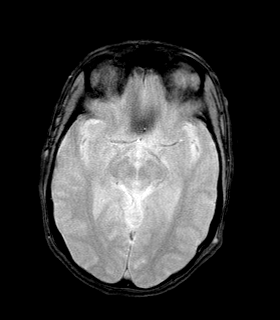
[im 24/24]
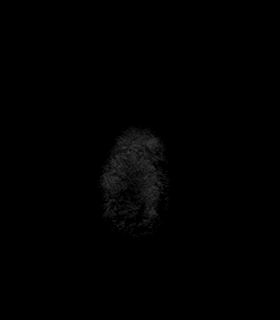

[Series 8: T1 · axial · 1.0mm · 1.00mm/px · z∈[-58,+88]mm · 17 of 160 slices shown (2 of 2)]
[im 1/160]
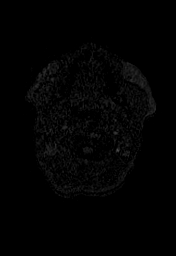
[im 10/160]
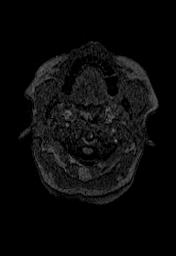
[im 19/160]
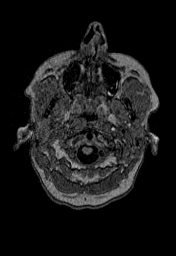
[im 29/160]
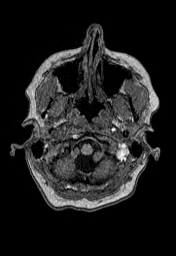
[im 38/160]
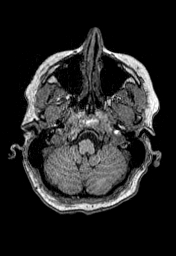
[im 47/160]
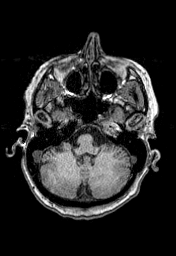
[im 57/160]
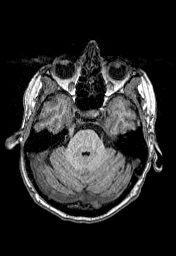
[im 66/160]
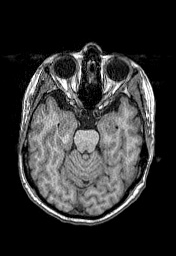
[im 75/160]
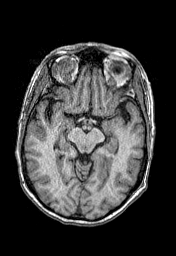
[im 85/160]
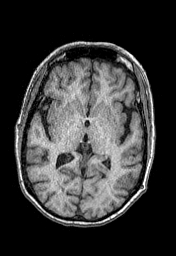
[im 94/160]
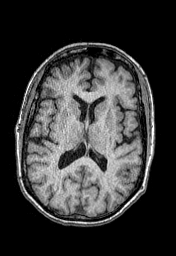
[im 103/160]
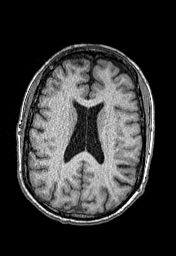
[im 113/160]
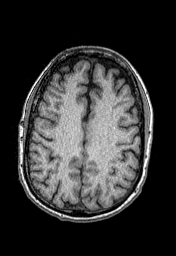
[im 122/160]
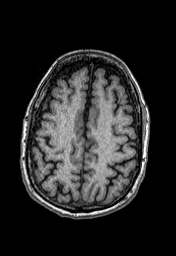
[im 131/160]
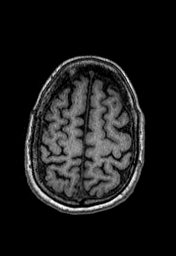
[im 141/160]
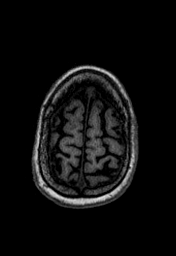
[im 150/160]
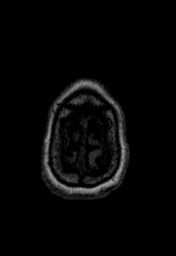

[Series 9: T2 · coronal · 5.0mm · 0.43mm/px · 3 of 29 slices shown (3 of 3)]
[im 1/29]
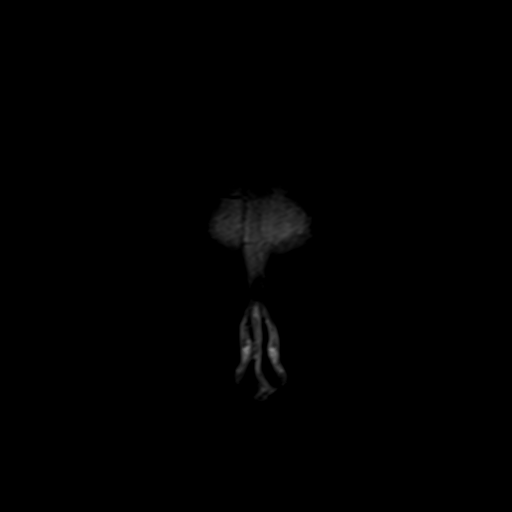
[im 15/29]
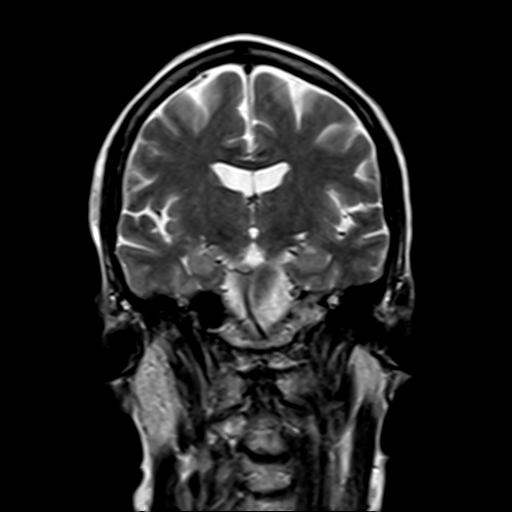
[im 29/29]
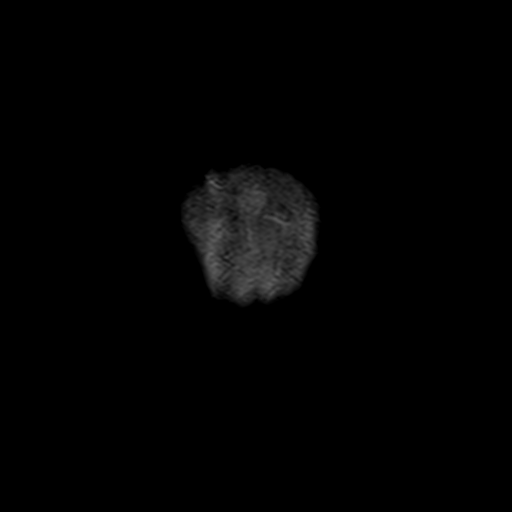

[Series 100: DWI · axial · 3.0mm · 1.20mm/px · z∈[-57,+102]mm · 6 of 54 slices shown (2 of 2)]
[im 1/54]
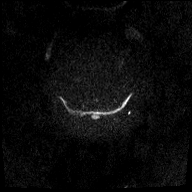
[im 11/54]
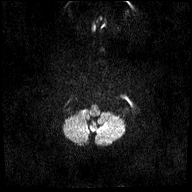
[im 22/54]
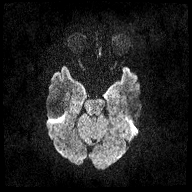
[im 32/54]
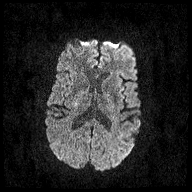
[im 43/54]
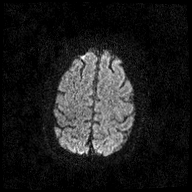
[im 54/54]
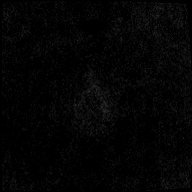

[47 of 48 positions shown; findings below may reference images not displayed]

FINDINGS: Brain: Cerebral volume within normal limits for patient age. No
focal parenchymal signal abnormality identified. No abnormal foci of
restricted diffusion to suggest acute or subacute ischemia.
Gray-white matter differentiation well maintained. No
encephalomalacia to suggest chronic infarction. No foci of
susceptibility artifact to suggest acute or chronic intracranial
hemorrhage.

Mass lesion, midline shift or mass effect. No hydrocephalus. No
extra-axial fluid collection. Major dural sinuses are grossly
patent.

Pituitary gland and suprasellar region are normal. Midline
structures intact and normal.

Vascular: Major intracranial vascular flow voids well maintained and
normal in appearance.

Skull and upper cervical spine: Craniocervical junction normal.
Visualized upper cervical spine within normal limits. Bone marrow
signal intensity normal. No scalp soft tissue abnormality.

Sinuses/Orbits: Globes and orbital soft tissues within normal
limits. Paranasal sinuses are clear. No mastoid effusion. Inner ear
structures normal.

Other: None.
IMPRESSION: Normal brain MRI for age.  No acute intracranial abnormality.

## 2018-02-03 ENCOUNTER — Encounter: Payer: Self-pay | Admitting: Physician Assistant

## 2018-02-03 ENCOUNTER — Ambulatory Visit (INDEPENDENT_AMBULATORY_CARE_PROVIDER_SITE_OTHER): Payer: BLUE CROSS/BLUE SHIELD | Admitting: Physician Assistant

## 2018-02-03 VITALS — BP 107/64 | HR 68 | Ht <= 58 in | Wt 112.0 lb

## 2018-02-03 DIAGNOSIS — J3489 Other specified disorders of nose and nasal sinuses: Secondary | ICD-10-CM

## 2018-02-03 DIAGNOSIS — G44221 Chronic tension-type headache, intractable: Secondary | ICD-10-CM

## 2018-02-03 DIAGNOSIS — M2550 Pain in unspecified joint: Secondary | ICD-10-CM | POA: Diagnosis not present

## 2018-02-03 MED ORDER — TOPIRAMATE 50 MG PO TABS: ORAL_TABLET | ORAL | 2 refills | Status: DC

## 2018-02-03 MED ORDER — BUTALBITAL-APAP-CAFFEINE 50-325-40 MG PO TABS: 1.0000 | ORAL_TABLET | Freq: Four times a day (QID) | ORAL | 0 refills | Status: DC | PRN

## 2018-02-03 MED ORDER — DICLOFENAC SODIUM 1 % TD GEL: TRANSDERMAL | 1 refills | Status: DC

## 2018-02-03 NOTE — Patient Instructions (Signed)
AYR nasal gel for nasal moisturizer.

## 2018-02-04 ENCOUNTER — Telehealth: Payer: Self-pay

## 2018-02-04 MED ORDER — TOPIRAMATE 50 MG PO TABS: ORAL_TABLET | ORAL | 2 refills | Status: DC

## 2018-02-04 NOTE — Telephone Encounter (Signed)
Shaina called and states the topiramate bid. The last prescription read once daily and Jade wanted her to increase to twice daily.

## 2018-02-04 NOTE — Telephone Encounter (Signed)
Done. Sent new rx.

## 2018-02-05 DIAGNOSIS — M810 Age-related osteoporosis without current pathological fracture: Secondary | ICD-10-CM | POA: Diagnosis not present

## 2018-02-05 DIAGNOSIS — M47816 Spondylosis without myelopathy or radiculopathy, lumbar region: Secondary | ICD-10-CM | POA: Diagnosis not present

## 2018-02-05 DIAGNOSIS — Z6823 Body mass index (BMI) 23.0-23.9, adult: Secondary | ICD-10-CM | POA: Diagnosis not present

## 2018-02-05 DIAGNOSIS — M419 Scoliosis, unspecified: Secondary | ICD-10-CM | POA: Diagnosis not present

## 2018-02-05 DIAGNOSIS — M2550 Pain in unspecified joint: Secondary | ICD-10-CM | POA: Insufficient documentation

## 2018-02-05 DIAGNOSIS — M7918 Myalgia, other site: Secondary | ICD-10-CM | POA: Diagnosis not present

## 2018-02-05 NOTE — Progress Notes (Signed)
Subjective:    Patient ID: Leslie Roberson, female    DOB: July 28, 1955, 62 y.o.   MRN: [id number]  HPI Pt is a 62 female with CAD, asthma, MDD who presents to the clinic to follow up on headaches.   She was seen 10/17 by Dr. Georgina Snell for worsening headache for 6 months. previously only using tylenol. Was given topamax 50mg  once a day. MRI no acute findings. topamax seems to be helping. HA's have decreased.   She does complain of all over joint pain.   She was having rhinorrhea. She has been using atrovent and now has nasal dryness. Her nose had bleed some recently.   .. Active Ambulatory Problems    Diagnosis Date Noted  . GERD (gastroesophageal reflux disease) 11/27/2015  . Osteoporosis 11/27/2015  . GAD (generalized anxiety disorder) 11/27/2015  . MDD (major depressive disorder), recurrent, in full remission ([place]) 11/27/2015  . Chronic dryness of both eyes 11/27/2015  . Hypothyroidism 11/27/2015  . Asthma, chronic 11/27/2015  . Insomnia 11/27/2015  . Seborrheic keratoses 11/27/2015  . Diverticulitis of colon 12/21/2015  . Hip bursitis 12/25/2015  . Biceps tendonitis on right 12/25/2015  . Idiopathic scoliosis 01/22/2016  . Vaginal atrophy 01/31/2016  . Rosacea 02/04/2016  . Right knee pain 02/19/2016  . Paresthesia 04/03/2016  . Facet hypertrophy of lumbosacral region 04/03/2016  . Chronic back pain 04/03/2016  . Bad taste in mouth 07/26/2016  . No energy 09/15/2016  . Lump in throat 01/27/2017  . Dysphagia 01/27/2017  . Chronic tension-type headache, intractable 02/05/2017  . Prediabetes 02/05/2017  . Right leg pain 02/06/2017  . Atypical chest pain 05/24/2017  . Itchy scalp 05/24/2017  . Pneumothorax 06/25/2017  . Angina, class III ([place]) 07/07/2017  . Coronary artery disease involving native coronary artery of native heart with angina pectoris ([place])   . Tongue mass 08/14/2017  . Excessive sweating 09/22/2017  . Rhinorrhea 01/04/2018  . Arthralgia 02/05/2018    Resolved Ambulatory Problems    Diagnosis Date Noted  . Acute medial meniscus tear 04/23/2016  . Left knee pain 04/28/2016  . Mild intermittent asthma without complication 43/32/9518   Past Medical History:  Diagnosis Date  . Anxiety   . Arthritis   . Coronary artery disease   . Depression   . History of blood transfusion   . Pneumonia 2016  . Pre-diabetes   . Thyroid disease       Review of Systems See HPI.     Objective:   Physical Exam  Constitutional: She is oriented to person, place, and time. She appears well-developed and well-nourished.  HENT:  Head: Normocephalic and atraumatic.  Cardiovascular: Normal rate and regular rhythm.  Pulmonary/Chest: Effort normal.  Neurological: She is alert and oriented to person, place, and time.  Psychiatric: She has a normal mood and affect. Her behavior is normal.          Assessment & Plan:  Marland KitchenMarland KitchenDiagnoses and all orders for this visit:  Chronic tension-type headache, intractable -     butalbital-acetaminophen-caffeine (FIORICET, ESGIC) 50-325-40 MG tablet; Take 1-2 tablets by mouth every 6 (six) hours as needed for headache.  Arthralgia, unspecified joint -     diclofenac sodium (VOLTAREN) 1 % GEL; APPLY 4G TOPICALLY 4 TIMES DAILY  Nasal dryness  Other orders -     Discontinue: topiramate (TOPAMAX) 50 MG tablet; One half tab by mouth daily for a week, then one tab by mouth daily.   I believe patient is having a combination  of tension headaches and migraines. I believe that if she improves her neck tension migraines with decrease. topamax increased to bid. Discussed massage, voltaren gel, icy/hot patches, heat for neck pain and tightness. fioricet given for more painful headaches.   Use voltaren gel on joints that hurt. Consider tumeric for inflammation and fish oil.   Use ayr for nasal dryness.   Follow up in 2 months.

## 2018-02-05 NOTE — Telephone Encounter (Signed)
Patient advised.

## 2018-02-06 ENCOUNTER — Encounter: Payer: Self-pay | Admitting: Physician Assistant

## 2018-02-06 DIAGNOSIS — J3489 Other specified disorders of nose and nasal sinuses: Secondary | ICD-10-CM | POA: Insufficient documentation

## 2018-02-10 ENCOUNTER — Ambulatory Visit (INDEPENDENT_AMBULATORY_CARE_PROVIDER_SITE_OTHER): Payer: BLUE CROSS/BLUE SHIELD | Admitting: Physician Assistant

## 2018-02-10 ENCOUNTER — Encounter: Payer: Self-pay | Admitting: Physician Assistant

## 2018-02-10 VITALS — BP 116/60 | HR 67 | Ht <= 58 in | Wt 109.0 lb

## 2018-02-10 DIAGNOSIS — R350 Frequency of micturition: Secondary | ICD-10-CM | POA: Diagnosis not present

## 2018-02-10 DIAGNOSIS — N3281 Overactive bladder: Secondary | ICD-10-CM

## 2018-02-10 LAB — POCT URINALYSIS DIPSTICK
Bilirubin, UA: NEGATIVE
Blood, UA: NEGATIVE
Glucose, UA: NEGATIVE
KETONES UA: NEGATIVE
Leukocytes, UA: NEGATIVE
NITRITE UA: NEGATIVE
PROTEIN UA: NEGATIVE
Spec Grav, UA: 1.02 (ref 1.010–1.025)
Urobilinogen, UA: 1 E.U./dL
pH, UA: 7 (ref 5.0–8.0)

## 2018-02-10 MED ORDER — MIRABEGRON ER 25 MG PO TB24: 25.0000 mg | ORAL_TABLET | Freq: Every day | ORAL | 2 refills | Status: DC

## 2018-02-10 NOTE — Progress Notes (Signed)
Subjective:    Patient ID: Leslie Roberson, female    DOB: 12/12/1955, 62 y.o.   MRN: [id number]  HPI  Pt is a 62 yo female who presents to the clinic with frequent urination, leakage and urgency worsening for the last year. 30 years ago she had bladder mesh put in. Since then her urinary symptoms have been much better. She remembers trying an oral medication that made her mouth "so dry". She denies any abdominal or flank pain. No fever, chills, vaginal discharge or itching.   .. Active Ambulatory Problems    Diagnosis Date Noted  . GERD (gastroesophageal reflux disease) 11/27/2015  . Osteoporosis 11/27/2015  . GAD (generalized anxiety disorder) 11/27/2015  . MDD (major depressive disorder), recurrent, in full remission ([place]) 11/27/2015  . Chronic dryness of both eyes 11/27/2015  . Hypothyroidism 11/27/2015  . Asthma, chronic 11/27/2015  . Insomnia 11/27/2015  . Seborrheic keratoses 11/27/2015  . Diverticulitis of colon 12/21/2015  . Hip bursitis 12/25/2015  . Biceps tendonitis on right 12/25/2015  . Idiopathic scoliosis 01/22/2016  . Vaginal atrophy 01/31/2016  . Rosacea 02/04/2016  . Right knee pain 02/19/2016  . Paresthesia 04/03/2016  . Facet hypertrophy of lumbosacral region 04/03/2016  . Chronic back pain 04/03/2016  . Bad taste in mouth 07/26/2016  . No energy 09/15/2016  . Lump in throat 01/27/2017  . Dysphagia 01/27/2017  . Chronic tension-type headache, intractable 02/05/2017  . Prediabetes 02/05/2017  . Right leg pain 02/06/2017  . Atypical chest pain 05/24/2017  . Itchy scalp 05/24/2017  . Pneumothorax 06/25/2017  . Angina, class III ([place]) 07/07/2017  . Coronary artery disease involving native coronary artery of native heart with angina pectoris ([place])   . Tongue mass 08/14/2017  . Excessive sweating 09/22/2017  . Rhinorrhea 01/04/2018  . Arthralgia 02/05/2018  . Nasal dryness 02/06/2018   Resolved Ambulatory Problems    Diagnosis Date Noted  . Acute  medial meniscus tear 04/23/2016  . Left knee pain 04/28/2016  . Mild intermittent asthma without complication 15/40/0867   Past Medical History:  Diagnosis Date  . Anxiety   . Arthritis   . Coronary artery disease   . Depression   . History of blood transfusion   . Pneumonia 2016  . Pre-diabetes   . Thyroid disease         Review of Systems  All other systems reviewed and are negative.      Objective:   Physical Exam  Constitutional: She is oriented to person, place, and time. She appears well-developed and well-nourished.  HENT:  Head: Normocephalic and atraumatic.  Cardiovascular: Normal rate and regular rhythm.  Pulmonary/Chest: Effort normal and breath sounds normal.  No CVA tenderness.   Abdominal: Soft. Bowel sounds are normal. She exhibits no distension and no mass. There is no tenderness. There is no rebound and no guarding.  Neurological: She is alert and oriented to person, place, and time.  Psychiatric: She has a normal mood and affect. Her behavior is normal.          Assessment & Plan:  Marland KitchenMarland KitchenDiagnoses and all orders for this visit:  OAB (overactive bladder) -     mirabegron ER (MYRBETRIQ) 25 MG TB24 tablet; Take 1 tablet (25 mg total) by mouth daily. -     POCT Urinalysis Dipstick -     Urine Culture  Urinary frequency -     POCT Urinalysis Dipstick -     Urine Culture   .Marland Kitchen Results for orders placed  or performed in visit on 02/10/18  Urine Culture  Result Value Ref Range   MICRO NUMBER: [id number]    SPECIMEN QUALITY: ADEQUATE    Sample Source NOT GIVEN    STATUS: FINAL    ISOLATE 1:      Multiple organisms present, each less than 10,000 CFU/mL. These organisms, commonly found on external and internal genitalia, are considered to be colonizers. No further testing performed.  POCT Urinalysis Dipstick  Result Value Ref Range   Color, UA yellow    Clarity, UA clear    Glucose, UA Negative Negative   Bilirubin, UA negative    Ketones, UA  negative    Spec Grav, UA 1.020 1.010 - 1.025   Blood, UA negative    pH, UA 7.0 5.0 - 8.0   Protein, UA Negative Negative   Urobilinogen, UA 1.0 0.2 or 1.0 E.U./dL   Nitrite, UA negative    Leukocytes, UA Negative Negative   Appearance     Odor     UA negative for any signs of infection. Culture returned negative as well. Seems like typical OAB symptoms. Started myrbetriq to avoid side effects of oxybutinin that patient likely tried. Discussed kegals. Pt aware may need to increase myrbetriq and that realistically improvement still may be 50-75 percent. If no improvement may need to consider urology for another surgical consult.

## 2018-02-10 NOTE — Patient Instructions (Addendum)
Mirabegron extended-release tablets What is this medicine? MIRABEGRON (MIR a BEG ron) is used to treat overactive bladder. This medicine reduces the amount of bathroom visits. It may also help to control wetting accidents. This medicine may be used for other purposes; ask your health care provider or pharmacist if you have questions. COMMON BRAND NAME(S): Myrbetriq What should I tell my health care provider before I take this medicine? They need to know if you have any of these conditions: -difficulty passing urine -high blood pressure -kidney disease -liver disease -an unusual or allergic reaction to mirabegron, other medicines, foods, dyes, or preservatives -pregnant or trying to get pregnant -breast-feeding How should I use this medicine? Take this medicine by mouth with a glass of water. Follow the directions on the prescription label. Do not cut, crush or chew this medicine. You can take it with or without food. If it upsets your stomach, take it with food. Take your medicine at regular intervals. Do not take it more often than directed. Do not stop taking except on your doctor's advice. Talk to your pediatrician regarding the use of this medicine in children. Special care may be needed. Overdosage: If you think you have taken too much of this medicine contact a poison control center or emergency room at once. NOTE: This medicine is only for you. Do not share this medicine with others. What if I miss a dose? If you miss a dose, take it as soon as you can. If it is almost time for your next dose, take only that dose. Do not take double or extra doses. What may interact with this medicine? -certain medicines for bladder problems like fesoterodine, oxybutynin, solifenacin, tolterodine -desipramine -digoxin -flecainide -ketoconazole -MAOIs like Carbex, Eldepryl, Marplan, Nardil, and Parnate -metoprolol -propafenone -thioridazine -warfarin This list may not describe all possible  interactions. Give your health care provider a list of all the medicines, herbs, non-prescription drugs, or dietary supplements you use. Also tell them if you smoke, drink alcohol, or use illegal drugs. Some items may interact with your medicine. What should I watch for while using this medicine? It may take 8 weeks to notice the full benefit from this medicine. You may need to limit your intake tea, coffee, caffeinated sodas, and alcohol. These drinks may make your symptoms worse. Visit your doctor or health care professional for regular checks on your progress. Check your blood pressure as directed. Ask your doctor or health care professional what your blood pressure should be and when you should contact him or her. What side effects may I notice from receiving this medicine? Side effects that you should report to your doctor or health care professional as soon as possible: -allergic reactions like skin rash, itching or hives, swelling of the face, lips, or tongue -chest pain or palpitations -severe or sudden headache -high blood pressure -fast, irregular heartbeat -redness, blistering, peeling or loosening of the skin, including inside the mouth -signs of infection like fever or chills; cough; sore throat; pain or difficulty passing urine -trouble passing urine or change in the amount of urine Side effects that usually do not require medical attention (report to your doctor or health care professional if they continue or are bothersome): -constipation -diarrhea -dizziness -dry eyes -joint pain -mild headache -nausea -runny nose This list may not describe all possible side effects. Call your doctor for medical advice about side effects. You may report side effects to FDA at 1-800-FDA-1088. Where should I keep my medicine? Keep out of the reach   of children. Store at room temperature between 15 and 30 degrees C (59 and 86 degrees F). Throw away any unused medicine after the expiration  date. NOTE: This sheet is a summary. It may not cover all possible information. If you have questions about this medicine, talk to your doctor, pharmacist, or health care provider.  2018 Elsevier/Gold Standard (2015-04-19 12:14:30)  Overactive Bladder, Adult Overactive bladder is a group of urinary symptoms. With overactive bladder, you may suddenly feel the need to pass urine (urinate) right away. After feeling this sudden urge, you might also leak urine if you cannot get to the bathroom fast enough (urinary incontinence). These symptoms might interfere with your daily work or social activities. Overactive bladder symptoms may also wake you up at night. Overactive bladder affects the nerve signals between your bladder and your brain. Your bladder may get the signal to empty before it is full. Very sensitive muscles can also make your bladder squeeze too soon. What are the causes? Many things can cause an overactive bladder. Possible causes include:  Urinary tract infection.  Infection of nearby tissues, such as the prostate.  Prostate enlargement.  Being pregnant with twins or more (multiples).  Surgery on the uterus or urethra.  Bladder stones, inflammation, or tumors.  Drinking too much caffeine or alcohol.  Certain medicines, especially those that you take to help your body get rid of extra fluid (diuretics) by increasing urine production.  Muscle or nerve weakness, especially from: ? A spinal cord injury. ? Stroke. ? Multiple sclerosis. ? Parkinson disease.  Diabetes. This can cause a high urine volume that fills the bladder so quickly that the normal urge to urinate is triggered very strongly.  Constipation. A buildup of too much stool can put pressure on your bladder.  What increases the risk? You may be at greater risk for overactive bladder if you:  Are an older adult.  Smoke.  Are going through menopause.  Have prostate problems.  Have a neurological disease,  such as stroke, dementia, Parkinson disease, or multiple sclerosis (MS).  Eat or drink things that irritate the bladder. These include alcohol, spicy food, and caffeine.  Are overweight or obese.  What are the signs or symptoms? The signs and symptoms of an overactive bladder include:  Sudden, strong urges to urinate.  Leaking urine.  Urinating eight or more times per day.  Waking up to urinate two or more times per night.  How is this diagnosed? Your health care provider may suspect overactive bladder based on your symptoms. The health care provider will do a physical exam and take your medical history. Blood or urine tests may also be done. For example, you might need to have a bladder function test to check how well you can hold your urine. You might also need to see a health care provider who specializes in the urinary tract (urologist). How is this treated? Treatment for overactive bladder depends on the cause of your condition and whether it is mild or severe. Certain treatments can be done in your health care provider's office or clinic. You can also make lifestyle changes at home. Options include: Behavioral Treatments  Biofeedback. A specialist uses sensors to help you become aware of your body's signals.  Keeping a daily log of when you need to urinate and what happens after the urge. This may help you manage your condition.  Bladder training. This helps you learn to control the urge to urinate by following a schedule that directs you  to urinate at regular intervals (timed voiding). At first, you might have to wait a few minutes after feeling the urge. In time, you should be able to schedule bathroom visits an hour or more apart.  Kegel exercises. These are exercises to strengthen the pelvic floor muscles, which support the bladder. Toning these muscles can help you control urination, even if your bladder muscles are overactive. A specialist will teach you how to do these  exercises correctly. They require daily practice.  Weight loss. If you are obese or overweight, losing weight might relieve your symptoms of overactive bladder. Talk to your health care provider about losing weight and whether there is a specific program or method that would work best for you.  Diet change. This might help if constipation is making your overactive bladder worse. Your health care provider or a dietitian can explain ways to change what you eat to ease constipation. You might also need to consume less alcohol and caffeine or drink other fluids at different times of the day.  Stopping smoking.  Wearing pads to absorb leakage while you wait for other treatments to take effect. Physical Treatments  Electrical stimulation. Electrodes send gentle pulses of electricity to strengthen the nerves or muscles that help to control the bladder. Sometimes, the electrodes are placed outside of the body. In other cases, they might be placed inside the body (implanted). This treatment can take several months to have an effect.  Supportive devices. Women may need a plastic device that fits into the vagina and supports the bladder (pessary). Medicines Several medicines can help treat overactive bladder and are usually used along with other treatments. Some are injected into the muscles involved in urination. Others come in pill form. Your health care provider may prescribe:  Antispasmodics. These medicines block the signals that the nerves send to the bladder. This keeps the bladder from releasing urine at the wrong time.  Tricyclic antidepressants. These types of antidepressants also relax bladder muscles.  Surgery  You may have a device implanted to help manage the nerve signals that indicate when you need to urinate.  You may have surgery to implant electrodes for electrical stimulation.  Sometimes, very severe cases of overactive bladder require surgery to change the shape of the  bladder. Follow these instructions at home:  Take medicines only as directed by your health care provider.  Use any implants or a pessary as directed by your health care provider.  Make any diet or lifestyle changes that are recommended by your health care provider. These might include: ? Drinking less fluid or drinking at different times of the day. If you need to urinate often during the night, you may need to stop drinking fluids early in the evening. ? Cutting down on caffeine or alcohol. Both can make an overactive bladder worse. Caffeine is found in coffee, tea, and sodas. ? Doing Kegel exercises to strengthen muscles. ? Losing weight if you need to. ? Eating a healthy and balanced diet to prevent constipation.  Keep a journal or log to track how much and when you drink and also when you feel the need to urinate. This will help your health care provider to monitor your condition. Contact a health care provider if:  Your symptoms do not get better after treatment.  Your pain and discomfort are getting worse.  You have more frequent urges to urinate.  You have a fever. Get help right away if: You are not able to control your bladder at  all. This information is not intended to replace advice given to you by your health care provider. Make sure you discuss any questions you have with your health care provider. Document Released: 01/11/2009 Document Revised: 08/23/2015 Document Reviewed: 08/10/2013 Elsevier Interactive Patient Education  [employer].

## 2018-02-11 ENCOUNTER — Telehealth: Payer: Self-pay

## 2018-02-11 LAB — URINE CULTURE
MICRO NUMBER:: 91367814
SPECIMEN QUALITY: ADEQUATE

## 2018-02-11 NOTE — Telephone Encounter (Signed)
Patient advised of recommendations.  

## 2018-02-11 NOTE — Telephone Encounter (Signed)
Absolutely.  Should improve in a few days.

## 2018-02-11 NOTE — Telephone Encounter (Signed)
Leslie Roberson states the Myrbetriq is causing nausea, upset stomach and she was unable to sleep. She wanted to know if these side effects will wear off. Please advise.

## 2018-02-11 NOTE — Progress Notes (Signed)
Call pt: urine culture negative for any bacteria growth. Continue with current treatment plan.

## 2018-02-15 ENCOUNTER — Encounter: Payer: Self-pay | Admitting: Family Medicine

## 2018-02-15 DIAGNOSIS — F333 Major depressive disorder, recurrent, severe with psychotic symptoms: Secondary | ICD-10-CM | POA: Diagnosis not present

## 2018-03-01 ENCOUNTER — Encounter: Payer: Self-pay | Admitting: Physician Assistant

## 2018-03-03 DIAGNOSIS — H2513 Age-related nuclear cataract, bilateral: Secondary | ICD-10-CM | POA: Diagnosis not present

## 2018-03-03 DIAGNOSIS — H524 Presbyopia: Secondary | ICD-10-CM | POA: Diagnosis not present

## 2018-03-04 ENCOUNTER — Ambulatory Visit (INDEPENDENT_AMBULATORY_CARE_PROVIDER_SITE_OTHER): Payer: BLUE CROSS/BLUE SHIELD | Admitting: Family Medicine

## 2018-03-04 ENCOUNTER — Encounter: Payer: Self-pay | Admitting: Family Medicine

## 2018-03-04 VITALS — BP 103/60 | HR 65 | Ht <= 58 in | Wt 109.0 lb

## 2018-03-04 DIAGNOSIS — R05 Cough: Secondary | ICD-10-CM | POA: Diagnosis not present

## 2018-03-04 DIAGNOSIS — J4520 Mild intermittent asthma, uncomplicated: Secondary | ICD-10-CM

## 2018-03-04 DIAGNOSIS — Z780 Asymptomatic menopausal state: Secondary | ICD-10-CM | POA: Diagnosis not present

## 2018-03-04 DIAGNOSIS — R059 Cough, unspecified: Secondary | ICD-10-CM

## 2018-03-04 MED ORDER — BENZONATATE 200 MG PO CAPS: 200.0000 mg | ORAL_CAPSULE | Freq: Three times a day (TID) | ORAL | 0 refills | Status: DC | PRN

## 2018-03-04 MED ORDER — AZITHROMYCIN 250 MG PO TABS: 250.0000 mg | ORAL_TABLET | Freq: Every day | ORAL | 0 refills | Status: DC

## 2018-03-04 MED ORDER — ALBUTEROL SULFATE HFA 108 (90 BASE) MCG/ACT IN AERS: 1.0000 | INHALATION_SPRAY | RESPIRATORY_TRACT | 3 refills | Status: DC | PRN

## 2018-03-04 MED ORDER — PREDNISONE 10 MG PO TABS: 30.0000 mg | ORAL_TABLET | Freq: Every day | ORAL | 0 refills | Status: DC

## 2018-03-04 MED ORDER — GUAIFENESIN-CODEINE 100-10 MG/5ML PO SOLN: 5.0000 mL | Freq: Four times a day (QID) | ORAL | 0 refills | Status: DC | PRN

## 2018-03-04 NOTE — Progress Notes (Signed)
Leslie Roberson is a 62 y.o. female who presents to [facility] today for nasal congestion runny nose wheezing chest tightness productive cough.  Patient symptom has been going on now for about a week.  She became ill when exposed to a sick grandchild.  She is been using over-the-counter medication including chloracetic.  She has used a backup albuterol inhaler which notes that it does help a bit.   ROS as above:  Exam:  BP 103/60   Pulse 65   Ht 4\' 9"  (1.448 m)   Wt 109 lb (49.4 kg)   SpO2 100%   BMI 23.59 kg/m  Wt Readings from Last 5 Encounters:  03/04/18 109 lb (49.4 kg)  02/10/18 109 lb (49.4 kg)  02/03/18 112 lb (50.8 kg)  01/14/18 116 lb (52.6 kg)  01/01/18 113 lb (51.3 kg)    Gen: Well NAD HEENT: EOMI,  MMM clear nasal discharge.  Inflamed nasal turbinates bilaterally.  Normal posterior pharynx and tympanic membranes. Lungs: Normal work of breathing.  Slight wheezing present bilaterally upon expiratory phase. Heart: RRR no MRG Abd: NABS, Soft. Nondistended, Nontender Exts: Brisk capillary refill, warm and well perfused.   Lab and Radiology Results No results found for this or any previous visit (from the past 72 hour(s)). No results found.    Assessment and Plan: 62 y.o. female with bronchitis likely.  Concern for possible early second sickening.  Plan for trial with albuterol, Tessalon Perles, codeine cough syrup.  Will use azithromycin and prednisone as backup if not improved.  Recheck as needed.  Additionally patient due for bone density test which is ordered today as well.   Orders Placed This Encounter  Procedures  . DG Bone Density    Standing Status:   Future    Standing Expiration Date:   05/05/2019    Order Specific Question:   Reason for exam:    Answer:   eval bone density    Order Specific Question:   Preferred imaging  location?    Answer:   [facility]   Meds ordered this encounter  Medications  . azithromycin (ZITHROMAX) 250 MG tablet    Sig: Take 1 tablet (250 mg total) by mouth daily. Take first 2 tablets together, then 1 every day until finished.    Dispense:  6 tablet    Refill:  0  . albuterol (PROVENTIL HFA;VENTOLIN HFA) 108 (90 Base) MCG/ACT inhaler    Sig: Inhale 1-2 puffs into the lungs every 4 (four) hours as needed for wheezing or shortness of breath.    Dispense:  1 Inhaler    Refill:  3  . benzonatate (TESSALON) 200 MG capsule    Sig: Take 1 capsule (200 mg total) by mouth 3 (three) times daily as needed for cough.    Dispense:  45 capsule    Refill:  0  . guaiFENesin-codeine 100-10 MG/5ML syrup    Sig: Take 5 mLs by mouth every 6 (six) hours as needed.    Dispense:  120 mL    Refill:  0  . predniSONE (DELTASONE) 10 MG tablet    Sig: Take 3 tablets (30 mg total) by mouth daily with breakfast.    Dispense:  15 tablet    Refill:  0     Historical information moved to improve visibility of documentation.  Past Medical History:  Diagnosis Date  . Anxiety   . Arthritis    "right  little finger; base of thumb left hand; spine" (07/07/2017)  . Asthma, chronic 11/27/2015  . Chronic back pain   . Coronary artery disease    4/19 PCI/DEx2 to LAD, and PCI/DES x1 to RCA, normal EF  . Depression   . Diverticulitis of colon 12/21/2015   Colonoscopy every 5 years/digestive health.   . GERD (gastroesophageal reflux disease) 11/27/2015  . History of blood transfusion    "related to spinal fusions"  . Hypothyroidism   . Idiopathic scoliosis 01/22/2016  . Osteoporosis 11/27/2015   On prolia.   . Pneumonia 2016  . Pneumothorax 06/26/2017  . Pre-diabetes   . Thyroid disease    Past Surgical History:  Procedure Laterality Date  . ABDOMINAL HYSTERECTOMY    . bladder mesh     S/P bladder sling"  . CORONARY STENT INTERVENTION N/A 07/07/2017   Procedure: CORONARY STENT INTERVENTION;   Surgeon: Leonie Man, MD;  Location: [facility];  Service: Cardiovascular;  Laterality: N/A;  . ELBOW SURGERY Right    "dislocation"  . INCONTINENCE SURGERY     "didn't work"  . INTRAVASCULAR PRESSURE WIRE/FFR STUDY N/A 07/07/2017   Procedure: INTRAVASCULAR PRESSURE WIRE/FFR STUDY;  Surgeon: Leonie Man, MD;  Location: [facility];  Service: Cardiovascular;  Laterality: N/A;  . JOINT REPLACEMENT    . LEFT HEART CATH AND CORONARY ANGIOGRAPHY N/A 07/07/2017   Procedure: LEFT HEART CATH AND CORONARY ANGIOGRAPHY;  Surgeon: Leonie Man, MD;  Location: [facility];  Service: Cardiovascular;  Laterality: N/A;  . SPINAL FUSION  ~ 1970 X 3   "below neck - lower back"  . TONSILLECTOMY    . TOTAL HIP ARTHROPLASTY Left    Social History   Tobacco Use  . Smoking status: Never Smoker  . Smokeless tobacco: Never Used  Substance Use Topics  . Alcohol use: Yes    Comment: 07/07/2017 "5-6 times/year; 1 drink at each occasion"   family history includes COPD in her paternal aunt; Cancer in her mother; Depression in her mother; Diabetes in her maternal aunt; Heart attack in her father; Hyperlipidemia in her father and paternal aunt; Hypertension in her father.  Medications: Current Outpatient Medications  Medication Sig Dispense Refill  . albuterol (PROVENTIL HFA;VENTOLIN HFA) 108 (90 Base) MCG/ACT inhaler Inhale 1-2 puffs into the lungs every 4 (four) hours as needed for wheezing or shortness of breath. 1 Inhaler 3  . aspirin EC 81 MG tablet Take 81 mg by mouth at bedtime.    Marland Kitchen atorvastatin (LIPITOR) 20 MG tablet Take 1 tablet (20 mg total) by mouth daily. 90 tablet 3  . azelastine (ASTELIN) 0.1 % nasal spray Place 1 spray into both nostrils 2 (two) times daily. Use in each nostril as directed 30 mL 2  . busPIRone (BUSPAR) 15 MG tablet Take 1 tablet (15 mg total) by mouth daily. 30 tablet 0  . butalbital-acetaminophen-caffeine (FIORICET, ESGIC) 50-325-40 MG tablet Take  1-2 tablets by mouth every 6 (six) hours as needed for headache. 20 tablet 0  . calcium-vitamin D (OSCAL WITH D) 500-200 MG-UNIT tablet Take 1 tablet by mouth daily.    . clopidogrel (PLAVIX) 75 MG tablet Take 1 tablet (75 mg total) by mouth daily with breakfast. 90 tablet 2  . denosumab (PROLIA) 60 MG/ML SOLN injection Inject 60 mg into the skin every 6 (six) months. Administer in upper arm, thigh, or abdomen    . desvenlafaxine (PRISTIQ) 100 MG 24 hr tablet Take 100 mg by  mouth at bedtime 30 tablet 0  . desvenlafaxine (PRISTIQ) 50 MG 24 hr tablet TAKE 1 TABLET BY MOUTH AT BEDTIME 90 tablet 1  . diclofenac sodium (VOLTAREN) 1 % GEL APPLY 4G TOPICALLY 4 TIMES DAILY 100 g 1  . gabapentin (NEURONTIN) 300 MG capsule 1 po daily and 2 po qhs 180 capsule 3  . levothyroxine (SYNTHROID, LEVOTHROID) 125 MCG tablet Take 0.5 tablets (62.5 mcg total) by mouth daily. 90 tablet 1  . lidocaine (LIDODERM) 5 % Place 1 patch onto the skin daily. Remove & Discard patch within 12 hours or as directed by MD (Patient taking differently: Place 1 patch onto the skin daily as needed (for pain). Remove & Discard patch within 12 hours or as directed by MD) 30 patch 12  . Menthol, Topical Analgesic, (MINERAL ICE EX) Apply 1 application to affected sites one to two times a day as needed (for pain)    . metoprolol succinate (TOPROL XL) 25 MG 24 hr tablet Take 1 tablet (25 mg total) by mouth daily. 90 tablet 3  . mirabegron ER (MYRBETRIQ) 25 MG TB24 tablet Take 1 tablet (25 mg total) by mouth daily. 30 tablet 2  . nitroGLYCERIN (NITROSTAT) 0.4 MG SL tablet PLACE 1 TABLET (0.4 MG TOTAL) UNDER THE TONGUE EVERY 5 (FIVE) MINUTES AS NEEDED. 25 tablet 2  . Omega-3 Fatty Acids (FISH OIL PO) Take 1 capsule by mouth daily.     . pantoprazole (PROTONIX) 40 MG tablet TAKE 1 TABLET BY MOUTH EVERY DAY 90 tablet 1  . topiramate (TOPAMAX) 50 MG tablet One tablet twice a day. 60 tablet 2  . traZODone (DESYREL) 50 MG tablet TAKE 1 TABLET BY MOUTH  EVERYDAY AT BEDTIME 90 tablet 1  . azithromycin (ZITHROMAX) 250 MG tablet Take 1 tablet (250 mg total) by mouth daily. Take first 2 tablets together, then 1 every day until finished. 6 tablet 0  . benzonatate (TESSALON) 200 MG capsule Take 1 capsule (200 mg total) by mouth 3 (three) times daily as needed for cough. 45 capsule 0  . clonazePAM (KLONOPIN) 0.5 MG tablet Take 1 tablet (0.5 mg total) by mouth at bedtime. 30 tablet 0  . guaiFENesin-codeine 100-10 MG/5ML syrup Take 5 mLs by mouth every 6 (six) hours as needed. 120 mL 0  . predniSONE (DELTASONE) 10 MG tablet Take 3 tablets (30 mg total) by mouth daily with breakfast. 15 tablet 0   No current facility-administered medications for this visit.    Allergies  Allergen Reactions  . Hydromorphone Rash  . Levofloxacin Other (See Comments)    Hallucinations  . Meperidine Nausea And Vomiting and Other (See Comments)    Also "doesn't work well for my pain"  . Broccoli [Brassica Oleracea Italica] Nausea Only and Other (See Comments)    Causes stomach pain also  . Adhesive [Tape] Rash     Discussed warning signs or symptoms. Please see discharge instructions. Patient expresses understanding.

## 2018-03-04 NOTE — Patient Instructions (Addendum)
Thank you for coming in today. Take antibiotics Use albuterol as needed for shortness of breath, or wheezing or cough.  Use tessalon pearles as needed for cough.  Use codeine cough syrup at bedtime as needed.   Fill and take back up prednisone if not improving.   Call or go to the emergency room if you get worse, have trouble breathing, have chest pains, or palpitations.   Phone number for xray here is [phone]  Acute Bronchitis, Adult Acute bronchitis is sudden (acute) swelling of the air tubes (bronchi) in the lungs. Acute bronchitis causes these tubes to fill with mucus, which can make it hard to breathe. It can also cause coughing or wheezing. In adults, acute bronchitis usually goes away within 2 weeks. A cough caused by bronchitis may last up to 3 weeks. Smoking, allergies, and asthma can make the condition worse. Repeated episodes of bronchitis may cause further lung problems, such as chronic obstructive pulmonary disease (COPD). What are the causes? This condition can be caused by germs and by substances that irritate the lungs, including:  Cold and flu viruses. This condition is most often caused by the same virus that causes a cold.  Bacteria.  Exposure to tobacco smoke, dust, fumes, and air pollution.  What increases the risk? This condition is more likely to develop in people who:  Have close contact with someone with acute bronchitis.  Are exposed to lung irritants, such as tobacco smoke, dust, fumes, and vapors.  Have a weak immune system.  Have a respiratory condition such as asthma.  What are the signs or symptoms? Symptoms of this condition include:  A cough.  Coughing up clear, yellow, or green mucus.  Wheezing.  Chest congestion.  Shortness of breath.  A fever.  Body aches.  Chills.  A sore throat.  How is this diagnosed? This condition is usually diagnosed with a physical exam. During the exam, your health care provider may order tests,  such as chest X-rays, to rule out other conditions. He or she may also:  Test a sample of your mucus for bacterial infection.  Check the level of oxygen in your blood. This is done to check for pneumonia.  Do a chest X-ray or lung function testing to rule out pneumonia and other conditions.  Perform blood tests.  Your health care provider will also ask about your symptoms and medical history. How is this treated? Most cases of acute bronchitis clear up over time without treatment. Your health care provider may recommend:  Drinking more fluids. Drinking more makes your mucus thinner, which may make it easier to breathe.  Taking a medicine for a fever or cough.  Taking an antibiotic medicine.  Using an inhaler to help improve shortness of breath and to control a cough.  Using a cool mist vaporizer or humidifier to make it easier to breathe.  Follow these instructions at home: Medicines  Take over-the-counter and prescription medicines only as told by your health care provider.  If you were prescribed an antibiotic, take it as told by your health care provider. Do not stop taking the antibiotic even if you start to feel better. General instructions  Get plenty of rest.  Drink enough fluids to keep your urine clear or pale yellow.  Avoid smoking and secondhand smoke. Exposure to cigarette smoke or irritating chemicals will make bronchitis worse. If you smoke and you need help quitting, ask your health care provider. Quitting smoking will help your lungs heal faster.  Use  an inhaler, cool mist vaporizer, or humidifier as told by your health care provider.  Keep all follow-up visits as told by your health care provider. This is important. How is this prevented? To lower your risk of getting this condition again:  Wash your hands often with soap and water. If soap and water are not available, use hand sanitizer.  Avoid contact with people who have cold symptoms.  Try not to  touch your hands to your mouth, nose, or eyes.  Make sure to get the flu shot every year.  Contact a health care provider if:  Your symptoms do not improve in 2 weeks of treatment. Get help right away if:  You cough up blood.  You have chest pain.  You have severe shortness of breath.  You become dehydrated.  You faint or keep feeling like you are going to faint.  You keep vomiting.  You have a severe headache.  Your fever or chills gets worse. This information is not intended to replace advice given to you by your health care provider. Make sure you discuss any questions you have with your health care provider. Document Released: 04/24/2004 Document Revised: 10/10/2015 Document Reviewed: 09/05/2015 Elsevier Interactive Patient Education  [employer].

## 2018-03-09 ENCOUNTER — Encounter: Payer: Self-pay | Admitting: Physician Assistant

## 2018-03-10 ENCOUNTER — Ambulatory Visit (INDEPENDENT_AMBULATORY_CARE_PROVIDER_SITE_OTHER): Payer: BLUE CROSS/BLUE SHIELD

## 2018-03-10 DIAGNOSIS — Z780 Asymptomatic menopausal state: Secondary | ICD-10-CM

## 2018-03-10 DIAGNOSIS — M810 Age-related osteoporosis without current pathological fracture: Secondary | ICD-10-CM | POA: Diagnosis not present

## 2018-03-10 DIAGNOSIS — M85851 Other specified disorders of bone density and structure, right thigh: Secondary | ICD-10-CM | POA: Diagnosis not present

## 2018-03-12 DIAGNOSIS — M47816 Spondylosis without myelopathy or radiculopathy, lumbar region: Secondary | ICD-10-CM | POA: Diagnosis not present

## 2018-03-12 DIAGNOSIS — M7061 Trochanteric bursitis, right hip: Secondary | ICD-10-CM | POA: Diagnosis not present

## 2018-03-12 DIAGNOSIS — Z6823 Body mass index (BMI) 23.0-23.9, adult: Secondary | ICD-10-CM | POA: Diagnosis not present

## 2018-03-12 DIAGNOSIS — M461 Sacroiliitis, not elsewhere classified: Secondary | ICD-10-CM | POA: Diagnosis not present

## 2018-03-14 ENCOUNTER — Other Ambulatory Visit (HOSPITAL_COMMUNITY): Payer: Self-pay | Admitting: Cardiology

## 2018-03-15 NOTE — Telephone Encounter (Signed)
Rx request sent to pharmacy.  

## 2018-03-17 ENCOUNTER — Ambulatory Visit (INDEPENDENT_AMBULATORY_CARE_PROVIDER_SITE_OTHER): Payer: BLUE CROSS/BLUE SHIELD | Admitting: Physician Assistant

## 2018-03-17 ENCOUNTER — Encounter: Payer: Self-pay | Admitting: Physician Assistant

## 2018-03-17 VITALS — BP 120/63 | HR 61 | Ht <= 58 in | Wt 108.0 lb

## 2018-03-17 DIAGNOSIS — Z0000 Encounter for general adult medical examination without abnormal findings: Secondary | ICD-10-CM | POA: Diagnosis not present

## 2018-03-17 DIAGNOSIS — G44221 Chronic tension-type headache, intractable: Secondary | ICD-10-CM | POA: Diagnosis not present

## 2018-03-17 MED ORDER — BUTALBITAL-APAP-CAFFEINE 50-325-40 MG PO TABS: 1.0000 | ORAL_TABLET | Freq: Four times a day (QID) | ORAL | 0 refills | Status: AC | PRN

## 2018-03-17 NOTE — Progress Notes (Signed)
Subjective:     Joane Postel Hsu is a 62 y.o. female and is here for a comprehensive physical exam. The patient reports no problems.  She has been a little more stressed and sad for last couple of weeks. She is worried about her husband who won't take time off work to get CPAP. Her dog is sick and my not make it. She will not be able to make it to see her daughter for christmas.   Social History   Socioeconomic History  . Marital status: Married    Spouse name: Not on file  . Number of children: 1  . Years of education: Not on file  . Highest education level: Not on file  Occupational History  . Not on file  Social Needs  . Financial resource strain: Not on file  . Food insecurity:    Worry: Not on file    Inability: Not on file  . Transportation needs:    Medical: Not on file    Non-medical: Not on file  Tobacco Use  . Smoking status: Never Smoker  . Smokeless tobacco: Never Used  Substance and Sexual Activity  . Alcohol use: Yes    Comment: 07/07/2017 "5-6 times/year; 1 drink at each occasion"  . Drug use: No  . Sexual activity: Yes  Lifestyle  . Physical activity:    Days per week: Not on file    Minutes per session: Not on file  . Stress: Not on file  Relationships  . Social connections:    Talks on phone: Not on file    Gets together: Not on file    Attends religious service: Not on file    Active member of club or organization: Not on file    Attends meetings of clubs or organizations: Not on file    Relationship status: Not on file  . Intimate partner violence:    Fear of current or ex partner: Not on file    Emotionally abused: Not on file    Physically abused: Not on file    Forced sexual activity: Not on file  Other Topics Concern  . Not on file  Social History Narrative  . Not on file   Health Maintenance  Topic Date Due  . PAP SMEAR-Modifier  01/30/2019  . MAMMOGRAM  01/16/2020  . DEXA SCAN  03/10/2020  . COLONOSCOPY  12/19/2020  . TETANUS/TDAP   01/29/2026  . INFLUENZA VACCINE  Completed  . Hepatitis C Screening  Completed  . HIV Screening  Completed    The following portions of the patient's history were reviewed and updated as appropriate: allergies, current medications, past family history, past medical history, past social history, past surgical history and problem list.  Review of Systems A comprehensive review of systems was negative.   Objective:    BP 120/63   Pulse 61   Ht 4' 9.75" (1.467 m)   Wt 108 lb (49 kg)   BMI 22.77 kg/m  General appearance: alert, cooperative and appears stated age Head: Normocephalic, without obvious abnormality, atraumatic Eyes: conjunctivae/corneas clear. PERRL, EOM's intact. Fundi benign. Ears: normal TM's and external ear canals both ears Nose: Nares normal. Septum midline. Mucosa normal. No drainage or sinus tenderness. Throat: lips, mucosa, and tongue normal; teeth and gums normal Neck: no adenopathy, no carotid bruit, no JVD, supple, symmetrical, trachea midline and thyroid not enlarged, symmetric, no tenderness/mass/nodules Back: range of motion normal, scolosis.  Lungs: clear to auscultation bilaterally Heart: regular rate and rhythm, S1,  S2 normal, no murmur, click, rub or gallop Abdomen: soft, non-tender; bowel sounds normal; no masses,  no organomegaly Extremities: extremities normal, atraumatic, no cyanosis or edema Pulses: 2+ and symmetric Skin: Skin color, texture, turgor normal. No rashes or lesions Lymph nodes: Cervical, supraclavicular, and axillary nodes normal. Neurologic: Alert and oriented X 3, normal strength and tone. Normal symmetric reflexes. Normal coordination and gait    Assessment:    Healthy female exam.      Plan:    Marland KitchenMarland KitchenJacayla was seen today for annual exam.  Diagnoses and all orders for this visit:  Routine physical examination  Chronic tension-type headache, intractable -     butalbital-acetaminophen-caffeine (FIORICET, ESGIC) 50-325-40 MG  tablet; Take 1-2 tablets by mouth every 6 (six) hours as needed for headache.   .. Discussed 150 minutes of exercise a week.  Encouraged vitamin D 1000 units and Calcium 1300mg  or 4 servings of dairy a day.  Labs up to date.  Vaccines up to date.  Colonoscopy and mammogram up to date.  Bone density up to date. prolia every 6 months.   Spent some time discussing her anxiety/depression. Encouraged to follow up with psych if symptoms worsen. Pt is not suicidal or harmful to herself or others.  See After Visit Summary for Counseling Recommendations

## 2018-03-17 NOTE — Patient Instructions (Signed)

## 2018-03-18 ENCOUNTER — Encounter: Payer: Self-pay | Admitting: Physician Assistant

## 2018-03-19 ENCOUNTER — Encounter: Payer: Self-pay | Admitting: Physician Assistant

## 2018-03-19 DIAGNOSIS — N8110 Cystocele, unspecified: Secondary | ICD-10-CM

## 2018-03-19 DIAGNOSIS — N3281 Overactive bladder: Secondary | ICD-10-CM

## 2018-03-22 ENCOUNTER — Telehealth: Payer: Self-pay | Admitting: Cardiology

## 2018-03-22 DIAGNOSIS — F333 Major depressive disorder, recurrent, severe with psychotic symptoms: Secondary | ICD-10-CM | POA: Diagnosis not present

## 2018-03-22 NOTE — Telephone Encounter (Signed)
Should be fine with her cardiac medications.  Only would need to watch for any signs of hypotension.

## 2018-03-22 NOTE — Telephone Encounter (Signed)
New message   Pt c/o medication issue:  1. Name of [place]  2. How are you currently taking this medication (dosage and times per day)?n/a  3. Are you having a reaction (difficulty breathing--STAT)?n/a  4. What is your medication issue? Patient wants to know if she can take this medication with heart medication?

## 2018-03-22 NOTE — Telephone Encounter (Signed)
Pt calling stating her therapist is wanting to start her on [place] but was instructed to consult with her cardiologist first to ensure she can take it with her cardiac meds. Will route to Pharm D.

## 2018-03-22 NOTE — Telephone Encounter (Signed)
Pt updated with Pharm D's recommendation. Verbalized understanding.

## 2018-04-13 DIAGNOSIS — M542 Cervicalgia: Secondary | ICD-10-CM | POA: Diagnosis not present

## 2018-04-13 DIAGNOSIS — R202 Paresthesia of skin: Secondary | ICD-10-CM | POA: Diagnosis not present

## 2018-04-13 DIAGNOSIS — M47816 Spondylosis without myelopathy or radiculopathy, lumbar region: Secondary | ICD-10-CM | POA: Diagnosis not present

## 2018-04-13 DIAGNOSIS — M419 Scoliosis, unspecified: Secondary | ICD-10-CM | POA: Diagnosis not present

## 2018-04-14 DIAGNOSIS — F333 Major depressive disorder, recurrent, severe with psychotic symptoms: Secondary | ICD-10-CM | POA: Diagnosis not present

## 2018-04-16 DIAGNOSIS — N8110 Cystocele, unspecified: Secondary | ICD-10-CM | POA: Diagnosis not present

## 2018-04-16 DIAGNOSIS — N816 Rectocele: Secondary | ICD-10-CM | POA: Diagnosis not present

## 2018-04-16 DIAGNOSIS — N952 Postmenopausal atrophic vaginitis: Secondary | ICD-10-CM | POA: Diagnosis not present

## 2018-04-16 DIAGNOSIS — N941 Unspecified dyspareunia: Secondary | ICD-10-CM | POA: Diagnosis not present

## 2018-04-18 ENCOUNTER — Encounter: Payer: Self-pay | Admitting: Physician Assistant

## 2018-04-19 DIAGNOSIS — F333 Major depressive disorder, recurrent, severe with psychotic symptoms: Secondary | ICD-10-CM | POA: Diagnosis not present

## 2018-04-20 ENCOUNTER — Other Ambulatory Visit: Payer: Self-pay | Admitting: Physician Assistant

## 2018-04-20 DIAGNOSIS — R7303 Prediabetes: Secondary | ICD-10-CM

## 2018-04-21 LAB — HEMOGLOBIN A1C
Hgb A1c MFr Bld: 6 % of total Hgb — ABNORMAL HIGH (ref <5.7)
Mean Plasma Glucose: 126 (calc)
eAG (mmol/L): 7 (calc)

## 2018-04-21 NOTE — Progress Notes (Signed)
Call pt: up a little from 6 months ago but still in pre-diabetes range. Continue to use diet and exercise to control sugars.

## 2018-04-24 ENCOUNTER — Other Ambulatory Visit: Payer: Self-pay | Admitting: Physician Assistant

## 2018-04-24 DIAGNOSIS — N3281 Overactive bladder: Secondary | ICD-10-CM

## 2018-04-27 ENCOUNTER — Encounter: Payer: Self-pay | Admitting: Physician Assistant

## 2018-04-27 ENCOUNTER — Ambulatory Visit (INDEPENDENT_AMBULATORY_CARE_PROVIDER_SITE_OTHER): Payer: BLUE CROSS/BLUE SHIELD | Admitting: Physician Assistant

## 2018-04-27 VITALS — BP 104/58 | HR 61 | Temp 98.0°F | Wt 110.0 lb

## 2018-04-27 DIAGNOSIS — J0100 Acute maxillary sinusitis, unspecified: Secondary | ICD-10-CM | POA: Diagnosis not present

## 2018-04-27 DIAGNOSIS — R05 Cough: Secondary | ICD-10-CM | POA: Diagnosis not present

## 2018-04-27 DIAGNOSIS — R059 Cough, unspecified: Secondary | ICD-10-CM

## 2018-04-27 MED ORDER — HYDROCODONE-HOMATROPINE 5-1.5 MG/5ML PO SYRP: 5.0000 mL | ORAL_SOLUTION | Freq: Two times a day (BID) | ORAL | 0 refills | Status: DC | PRN

## 2018-04-27 MED ORDER — AMOXICILLIN-POT CLAVULANATE 875-125 MG PO TABS: 1.0000 | ORAL_TABLET | Freq: Two times a day (BID) | ORAL | 0 refills | Status: DC

## 2018-04-27 NOTE — Progress Notes (Signed)
Subjective:    Patient ID: Leslie Roberson, female    DOB: January 03, 1956, 63 y.o.   MRN: [id number]  HPI: This is a 63 yo female who presents to the clinic today with a chief complaint of a productive cough x1 week. She states that it started as congestion, sinus pressure, and sore throat and has now progressed to a cough that keeps her up at night. Her husband was recently sick with similar symptoms. She has tried [employer] pearls which she is unsure if they helped. She has used Tylenol w/ codeine before and thinks it kept her up at night instead of helping her sleep. She denies fever, fatigue, nausea, vomiting, body aches.   .. Active Ambulatory Problems    Diagnosis Date Noted  . GERD (gastroesophageal reflux disease) 11/27/2015  . Osteoporosis 11/27/2015  . GAD (generalized anxiety disorder) 11/27/2015  . MDD (major depressive disorder), recurrent, in full remission ([place]) 11/27/2015  . Chronic dryness of both eyes 11/27/2015  . Hypothyroidism 11/27/2015  . Asthma, chronic 11/27/2015  . Insomnia 11/27/2015  . Seborrheic keratoses 11/27/2015  . Diverticulitis of colon 12/21/2015  . Hip bursitis 12/25/2015  . Biceps tendonitis on right 12/25/2015  . Idiopathic scoliosis 01/22/2016  . Vaginal atrophy 01/31/2016  . Rosacea 02/04/2016  . Right knee pain 02/19/2016  . Paresthesia 04/03/2016  . Facet hypertrophy of lumbosacral region 04/03/2016  . Chronic back pain 04/03/2016  . Bad taste in mouth 07/26/2016  . No energy 09/15/2016  . Lump in throat 01/27/2017  . Dysphagia 01/27/2017  . Chronic tension-type headache, intractable 02/05/2017  . Prediabetes 02/05/2017  . Right leg pain 02/06/2017  . Atypical chest pain 05/24/2017  . Itchy scalp 05/24/2017  . Pneumothorax 06/25/2017  . Angina, class III ([place]) 07/07/2017  . Coronary artery disease involving native coronary artery of native heart with angina pectoris ([place])   . Tongue mass 08/14/2017  . Excessive sweating 09/22/2017    . Rhinorrhea 01/04/2018  . Arthralgia 02/05/2018  . Nasal dryness 02/06/2018   Resolved Ambulatory Problems    Diagnosis Date Noted  . Acute medial meniscus tear 04/23/2016  . Left knee pain 04/28/2016  . Mild intermittent asthma without complication 75/12/2583   Past Medical History:  Diagnosis Date  . Anxiety   . Arthritis   . Coronary artery disease   . Depression   . History of blood transfusion   . Pneumonia 2016  . Pre-diabetes   . Thyroid disease        Review of Systems  Constitutional: Negative for chills, fatigue and fever.  HENT: Positive for rhinorrhea, sinus pressure and sinus pain. Negative for ear discharge, ear pain and sore throat.   Eyes: Negative for discharge and itching.  Respiratory: Positive for cough. Negative for chest tightness and shortness of breath.   Cardiovascular: Negative for chest pain and palpitations.  Gastrointestinal: Negative for nausea and vomiting.       Objective:   Physical Exam Vitals signs reviewed.  Constitutional:      General: She is not in acute distress.    Appearance: She is not ill-appearing.  HENT:     Head: Normocephalic and atraumatic.      Right Ear: Tympanic membrane, ear canal and external ear normal. There is no impacted cerumen.     Left Ear: Tympanic membrane, ear canal and external ear normal. There is no impacted cerumen.     Nose: Congestion and rhinorrhea present.     Mouth/Throat:  Mouth: Mucous membranes are moist.     Pharynx: Oropharynx is clear. No oropharyngeal exudate or posterior oropharyngeal erythema.  Eyes:     General: No scleral icterus.       Right eye: No discharge.        Left eye: No discharge.     Extraocular Movements: Extraocular movements intact.     Conjunctiva/sclera: Conjunctivae normal.     Pupils: Pupils are equal, round, and reactive to light.  Neck:     Musculoskeletal: Normal range of motion and neck supple. No neck rigidity or muscular tenderness.   Cardiovascular:     Rate and Rhythm: Normal rate.     Pulses: Normal pulses.     Heart sounds: Normal heart sounds.  Pulmonary:     Effort: Pulmonary effort is normal. No respiratory distress.     Breath sounds: Wheezing present. No rhonchi or rales.  Lymphadenopathy:     Cervical: Cervical adenopathy present.  Skin:    General: Skin is warm and dry.     Capillary Refill: Capillary refill takes less than 2 seconds.     Coloration: Skin is not jaundiced.     Findings: No bruising or rash.  Neurological:     Mental Status: She is alert.     Coordination: Coordination normal.     Gait: Gait normal.  Psychiatric:        Mood and Affect: Mood normal.        Behavior: Behavior normal.        Thought Content: Thought content normal.           Assessment & Plan:  Marland KitchenMarland KitchenKinzee was seen today for cough.  Diagnoses and all orders for this visit:  Acute non-recurrent maxillary sinusitis -     amoxicillin-clavulanate (AUGMENTIN) 875-125 MG tablet; Take 1 tablet by mouth 2 (two) times daily.  Cough -     HYDROcodone-homatropine (HYCODAN) 5-1.5 MG/5ML syrup; Take 5 mLs by mouth every 12 (twelve) hours as needed for cough.   Discussed mucinex and flonase. Hycodan given for cough. No concerns with controlled substance. Hopefully this will allow her to sleep. Start abx if not improving in next 2-3 days. HO given. Rest and hydrate.   Call if not improving by Friday.   Marland KitchenVernetta Honey PA-C, have reviewed and agree with the above documentation in it's entirety.

## 2018-04-27 NOTE — Patient Instructions (Signed)

## 2018-04-28 ENCOUNTER — Other Ambulatory Visit: Payer: Self-pay | Admitting: Physician Assistant

## 2018-05-17 DIAGNOSIS — F333 Major depressive disorder, recurrent, severe with psychotic symptoms: Secondary | ICD-10-CM | POA: Diagnosis not present

## 2018-05-25 DIAGNOSIS — M7061 Trochanteric bursitis, right hip: Secondary | ICD-10-CM | POA: Diagnosis not present

## 2018-05-25 DIAGNOSIS — M419 Scoliosis, unspecified: Secondary | ICD-10-CM | POA: Diagnosis not present

## 2018-05-25 DIAGNOSIS — M47816 Spondylosis without myelopathy or radiculopathy, lumbar region: Secondary | ICD-10-CM | POA: Diagnosis not present

## 2018-05-29 ENCOUNTER — Other Ambulatory Visit: Payer: Self-pay | Admitting: Physician Assistant

## 2018-05-29 DIAGNOSIS — K2100 Gastro-esophageal reflux disease with esophagitis, without bleeding: Secondary | ICD-10-CM

## 2018-06-03 ENCOUNTER — Other Ambulatory Visit: Payer: Self-pay

## 2018-06-03 MED ORDER — METOPROLOL SUCCINATE ER 25 MG PO TB24: 25.0000 mg | ORAL_TABLET | Freq: Every day | ORAL | 3 refills | Status: DC

## 2018-06-07 DIAGNOSIS — F333 Major depressive disorder, recurrent, severe with psychotic symptoms: Secondary | ICD-10-CM | POA: Diagnosis not present

## 2018-06-07 DIAGNOSIS — L298 Other pruritus: Secondary | ICD-10-CM | POA: Diagnosis not present

## 2018-06-07 DIAGNOSIS — D239 Other benign neoplasm of skin, unspecified: Secondary | ICD-10-CM | POA: Diagnosis not present

## 2018-06-21 ENCOUNTER — Other Ambulatory Visit: Payer: Self-pay | Admitting: Physician Assistant

## 2018-06-23 DIAGNOSIS — F333 Major depressive disorder, recurrent, severe with psychotic symptoms: Secondary | ICD-10-CM | POA: Diagnosis not present

## 2018-06-27 ENCOUNTER — Other Ambulatory Visit: Payer: Self-pay | Admitting: Physician Assistant

## 2018-06-27 DIAGNOSIS — N3281 Overactive bladder: Secondary | ICD-10-CM

## 2018-06-28 ENCOUNTER — Other Ambulatory Visit: Payer: Self-pay

## 2018-06-28 ENCOUNTER — Encounter: Payer: Self-pay | Admitting: Physician Assistant

## 2018-06-28 ENCOUNTER — Ambulatory Visit (INDEPENDENT_AMBULATORY_CARE_PROVIDER_SITE_OTHER): Payer: BC Managed Care – PPO | Admitting: Physician Assistant

## 2018-06-28 VITALS — BP 100/61 | HR 77 | Temp 98.6°F | Ht <= 58 in | Wt 111.0 lb

## 2018-06-28 DIAGNOSIS — R197 Diarrhea, unspecified: Secondary | ICD-10-CM

## 2018-06-28 MED ORDER — DIPHENOXYLATE-ATROPINE 2.5-0.025 MG PO TABS: 1.0000 | ORAL_TABLET | Freq: Four times a day (QID) | ORAL | 0 refills | Status: DC | PRN

## 2018-06-28 NOTE — Progress Notes (Signed)
Subjective:    Patient ID: Leslie Roberson, female    DOB: 1955-05-29, 63 y.o.   MRN: [id number]  HPI  Pt is a 63 yo female who calls in via webex to discuss one week of diarrhea. She has no known trigger. She is having 5-6 watery dark brown stools a day. She has lost 2lbs. She is feeling a little weak. She denies any fever, chills, cough, sOB, hematuria, melena, hematochezia, abdominal pain, dysuria. No recent abx. No recent travel. Pt drinks from city water. No recent lake water. She has been eating the same things. No recent gardening. She has one dog who only goes out on a leash.  Her husband is not sick. Colonoscopy up to date 2017 with 5 year follow up due to polyps.   .. Active Ambulatory Problems    Diagnosis Date Noted  . GERD (gastroesophageal reflux disease) 11/27/2015  . Osteoporosis 11/27/2015  . GAD (generalized anxiety disorder) 11/27/2015  . MDD (major depressive disorder), recurrent, in full remission ([place]) 11/27/2015  . Chronic dryness of both eyes 11/27/2015  . Hypothyroidism 11/27/2015  . Asthma, chronic 11/27/2015  . Insomnia 11/27/2015  . Seborrheic keratoses 11/27/2015  . Diverticulitis of colon 12/21/2015  . Hip bursitis 12/25/2015  . Biceps tendonitis on right 12/25/2015  . Idiopathic scoliosis 01/22/2016  . Vaginal atrophy 01/31/2016  . Rosacea 02/04/2016  . Right knee pain 02/19/2016  . Paresthesia 04/03/2016  . Facet hypertrophy of lumbosacral region 04/03/2016  . Chronic back pain 04/03/2016  . Bad taste in mouth 07/26/2016  . No energy 09/15/2016  . Lump in throat 01/27/2017  . Dysphagia 01/27/2017  . Chronic tension-type headache, intractable 02/05/2017  . Prediabetes 02/05/2017  . Right leg pain 02/06/2017  . Atypical chest pain 05/24/2017  . Itchy scalp 05/24/2017  . Pneumothorax 06/25/2017  . Angina, class III ([place]) 07/07/2017  . Coronary artery disease involving native coronary artery of native heart with angina pectoris ([place])   .  Tongue mass 08/14/2017  . Excessive sweating 09/22/2017  . Rhinorrhea 01/04/2018  . Arthralgia 02/05/2018  . Nasal dryness 02/06/2018   Resolved Ambulatory Problems    Diagnosis Date Noted  . Acute medial meniscus tear 04/23/2016  . Left knee pain 04/28/2016  . Mild intermittent asthma without complication 90/24/0973   Past Medical History:  Diagnosis Date  . Anxiety   . Arthritis   . Coronary artery disease   . Depression   . History of blood transfusion   . Pneumonia 2016  . Pre-diabetes   . Thyroid disease       Review of Systems See HPI>     Objective:   Physical Exam Vitals signs reviewed.  Constitutional:      Appearance: Normal appearance.  HENT:     Head: Normocephalic.  Cardiovascular:     Rate and Rhythm: Normal rate.  Pulmonary:     Effort: Pulmonary effort is normal.  Neurological:     General: No focal deficit present.     Mental Status: She is alert and oriented to person, place, and time.  Psychiatric:        Mood and Affect: Mood normal.           Assessment & Plan:  Marland KitchenMarland KitchenArdean was seen today for diarrhea.  Diagnoses and all orders for this visit:  Diarrhea, unspecified type -     Stool culture -     Clostridium difficile culture-fecal -     Ova and parasite examination -  diphenoxylate-atropine (LOMOTIL) 2.5-0.025 MG tablet; Take 1 tablet by mouth 4 (four) times daily as needed for diarrhea or loose stools.   Unclear etiology of diarrhea. No known risk factors for c.diff or parasites. Will check with stool culture. She has not started or stopped any recent medications. She feels fine otherwise. No abdominal pain to suspect diverticulitis. Start probiotic. Hold lomotil until we get stool testing back. Follow up as needed or with worsening symptoms.

## 2018-06-29 ENCOUNTER — Encounter: Payer: Self-pay | Admitting: Physician Assistant

## 2018-06-29 DIAGNOSIS — R197 Diarrhea, unspecified: Secondary | ICD-10-CM | POA: Diagnosis not present

## 2018-06-30 DIAGNOSIS — R197 Diarrhea, unspecified: Secondary | ICD-10-CM | POA: Diagnosis not present

## 2018-07-01 ENCOUNTER — Other Ambulatory Visit: Payer: Self-pay | Admitting: Neurology

## 2018-07-01 DIAGNOSIS — E039 Hypothyroidism, unspecified: Secondary | ICD-10-CM

## 2018-07-01 NOTE — Progress Notes (Signed)
Call pt: no ova and parasites. Stool culture looks negative but just a pre-lim report. Start lomotil if symptoms persist. Find out how she is doing?

## 2018-07-01 NOTE — Telephone Encounter (Signed)
Received paper refill request from [facility] [address] in [place] for Levothyroxine 137 mcg.   Reviewing notes it looks like patient takes this one tablet twice weekly (while taking Levothyroxine 125 mcg - 1/2 tablet on other days). Please advise on refill.

## 2018-07-02 MED ORDER — LEVOTHYROXINE SODIUM 137 MCG PO TABS: 137.0000 ug | ORAL_TABLET | ORAL | 0 refills | Status: DC

## 2018-07-03 LAB — STOOL CULTURE
MICRO NUMBER:: 367040
MICRO NUMBER:: 367041
MICRO NUMBER:: 367042
SHIGA RESULT:: NOT DETECTED
SPECIMEN QUALITY:: ADEQUATE
SPECIMEN QUALITY:: ADEQUATE
SPECIMEN QUALITY:: ADEQUATE

## 2018-07-03 LAB — OVA AND PARASITE EXAMINATION
CONCENTRATE RESULT:: NONE SEEN
MICRO NUMBER:: 366578
SPECIMEN QUALITY:: ADEQUATE
TRICHROME RESULT:: NONE SEEN

## 2018-07-04 LAB — CLOSTRIDIUM DIFFICILE CULTURE-FECAL

## 2018-07-04 NOTE — Progress Notes (Signed)
Call pt: see how she is doing with bowel movements. Negative for cdiff, bacteria, ova and parasites.

## 2018-07-05 ENCOUNTER — Other Ambulatory Visit: Payer: Self-pay | Admitting: Physician Assistant

## 2018-07-05 NOTE — Progress Notes (Signed)
Probiotics as long as you want but at least 1 month. Lomotil 5 days.

## 2018-07-07 DIAGNOSIS — F333 Major depressive disorder, recurrent, severe with psychotic symptoms: Secondary | ICD-10-CM | POA: Diagnosis not present

## 2018-07-08 ENCOUNTER — Encounter: Payer: Self-pay | Admitting: Neurology

## 2018-07-08 ENCOUNTER — Ambulatory Visit (INDEPENDENT_AMBULATORY_CARE_PROVIDER_SITE_OTHER): Payer: BC Managed Care – PPO | Admitting: Family Medicine

## 2018-07-08 ENCOUNTER — Encounter: Payer: Self-pay | Admitting: Family Medicine

## 2018-07-08 ENCOUNTER — Telehealth: Payer: Self-pay | Admitting: Neurology

## 2018-07-08 VITALS — BP 100/62 | HR 62 | Ht <= 58 in | Wt 113.0 lb

## 2018-07-08 DIAGNOSIS — G44221 Chronic tension-type headache, intractable: Secondary | ICD-10-CM

## 2018-07-08 DIAGNOSIS — R197 Diarrhea, unspecified: Secondary | ICD-10-CM

## 2018-07-08 MED ORDER — TOPIRAMATE 50 MG PO TABS: 50.0000 mg | ORAL_TABLET | Freq: Two times a day (BID) | ORAL | 2 refills | Status: DC

## 2018-07-08 NOTE — Telephone Encounter (Signed)
Patient left vm stating she is completely of out Topamax for her headaches and refill was denied.  RX denied due to refilled for 90 day supply at end of January and needs a visit to discuss. Per Dr. Madilyn Fireman to schedule for virtual visit with a doctor in the office today. Patient is agreeable to this plan. Patient has seen Dr. Georgina Snell in the past and requested to see him. Message sent to front desk to get her scheduled.  Dr. Georgina Snell - FYI.

## 2018-07-08 NOTE — Progress Notes (Addendum)
Virtual Visit  via Phone Note  I connected with      Leslie Roberson  by a telemedicine application and verified that I am speaking with the correct person using two identifiers.   I discussed the limitations of evaluation and management by telemedicine and the availability of in person appointments. The patient expressed understanding and agreed to proceed.  History of Present Illness: Leslie Roberson is a 63 y.o. female who would like to discuss chronic headache and topiramate.  Leslie Roberson is currently taking 50 mg of topiramate twice daily for management of her chronic headaches.  She notes that prior to starting this treatment she was having headaches almost every day and since she started it she does not have any all over the last month.  She feels pretty well denied significant side effects.  When asked she notes occasionally she will have some tingling sensation but notes her symptoms are quite mild.  She is very happy with how things are going.  She wonders if she could reduce the dose to daily dosing.  Additionally she was seen on March 30 for an episode of diarrhea.  She was prescribed Lomotil and has since stopped the Lomotil and is feeling completely better again.  She notes that she did not have other symptoms aside from diarrhea.  She denies any cough congestion fevers chills or body aches.  She feels great now.    Observations/Objective: BP 100/62   Pulse 62   Ht 4\' 10"  (1.473 m)   Wt 113 lb (51.3 kg)   BMI 23.62 kg/m  Wt Readings from Last 5 Encounters:  07/08/18 113 lb (51.3 kg)  06/28/18 111 lb (50.3 kg)  04/27/18 110 lb (49.9 kg)  03/17/18 108 lb (49 kg)  03/04/18 109 lb (49.4 kg)   Exam: Normal Speech.  No shortness of breath.  Lab and Radiology Results No results found for this or any previous visit (from the past 72 hour(s)). No results found.   Assessment and Plan: 63 y.o. female with  Chronic headaches: Doing well.  Plan to continue Topamax  at current dosages.  We did gust potentially reducing dosing.  I think it is reasonable to continue current dose because is working quite well but if she would like she could try and reduce the dose and let us know how it goes after about a month.  Diarrhea: Resolved.  Likely was self-limiting mild viral condition.  COVID-19.  Likely given absence of other symptoms.  PDMP not reviewed this encounter. No orders of the defined types were placed in this encounter.  Meds ordered this encounter  Medications  . topiramate (TOPAMAX) 50 MG tablet    Sig: Take 1 tablet (50 mg total) by mouth 2 (two) times daily.    Dispense:  180 tablet    Refill:  2    Follow Up Instructions:    I discussed the assessment and treatment plan with the patient. The patient was provided an opportunity to ask questions and all were answered. The patient agreed with the plan and demonstrated an understanding of the instructions.   The patient was advised to call back or seek an in-person evaluation if the symptoms worsen or if the condition fails to improve as anticipated.  I provided 11 minutes of non-face-to-face time during this encounter.    Historical information moved to improve visibility of documentation.  Past Medical History:  Diagnosis Date  . Anxiety   . Arthritis    "  right little finger; base of thumb left hand; spine" (07/07/2017)  . Asthma, chronic 11/27/2015  . Chronic back pain   . Coronary artery disease    4/19 PCI/DEx2 to LAD, and PCI/DES x1 to RCA, normal EF  . Depression   . Diverticulitis of colon 12/21/2015   Colonoscopy every 5 years/digestive health.   . GERD (gastroesophageal reflux disease) 11/27/2015  . History of blood transfusion    "related to spinal fusions"  . Hypothyroidism   . Idiopathic scoliosis 01/22/2016  . Osteoporosis 11/27/2015   On prolia.   . Pneumonia 2016  . Pneumothorax 06/26/2017  . Pre-diabetes   . Thyroid disease    Past Surgical History:  Procedure  Laterality Date  . ABDOMINAL HYSTERECTOMY    . bladder mesh     S/P bladder sling"  . CORONARY STENT INTERVENTION N/A 07/07/2017   Procedure: CORONARY STENT INTERVENTION;  Surgeon: Leonie Man, MD;  Location: [facility];  Service: Cardiovascular;  Laterality: N/A;  . ELBOW SURGERY Right    "dislocation"  . INCONTINENCE SURGERY     "didn't work"  . INTRAVASCULAR PRESSURE WIRE/FFR STUDY N/A 07/07/2017   Procedure: INTRAVASCULAR PRESSURE WIRE/FFR STUDY;  Surgeon: Leonie Man, MD;  Location: [facility];  Service: Cardiovascular;  Laterality: N/A;  . JOINT REPLACEMENT    . LEFT HEART CATH AND CORONARY ANGIOGRAPHY N/A 07/07/2017   Procedure: LEFT HEART CATH AND CORONARY ANGIOGRAPHY;  Surgeon: Leonie Man, MD;  Location: [facility];  Service: Cardiovascular;  Laterality: N/A;  . SPINAL FUSION  ~ 1970 X 3   "below neck - lower back"  . TONSILLECTOMY    . TOTAL HIP ARTHROPLASTY Left    Social History   Tobacco Use  . Smoking status: Never Smoker  . Smokeless tobacco: Never Used  Substance Use Topics  . Alcohol use: Yes    Comment: 07/07/2017 "5-6 times/year; 1 drink at each occasion"   family history includes COPD in her paternal aunt; Cancer in her mother; Depression in her mother; Diabetes in her maternal aunt; Heart attack in her father; Hyperlipidemia in her father and paternal aunt; Hypertension in her father.  Medications: Current Outpatient Medications  Medication Sig Dispense Refill  . albuterol (PROVENTIL HFA;VENTOLIN HFA) 108 (90 Base) MCG/ACT inhaler Inhale 1-2 puffs into the lungs every 4 (four) hours as needed for wheezing or shortness of breath. 1 Inhaler 3  . aspirin EC 81 MG tablet Take 81 mg by mouth at bedtime.    Marland Kitchen atorvastatin (LIPITOR) 20 MG tablet Take 1 tablet (20 mg total) by mouth daily. 90 tablet 3  . azelastine (ASTELIN) 0.1 % nasal spray Place 1 spray into both nostrils 2 (two) times daily. Use in each nostril as directed 30 mL 2   . busPIRone (BUSPAR) 15 MG tablet Take 1 tablet (15 mg total) by mouth daily. 30 tablet 0  . butalbital-acetaminophen-caffeine (FIORICET, ESGIC) 50-325-40 MG tablet Take 1-2 tablets by mouth every 6 (six) hours as needed for headache. 20 tablet 0  . calcium-vitamin D (OSCAL WITH D) 500-200 MG-UNIT tablet Take 1 tablet by mouth daily.    . clopidogrel (PLAVIX) 75 MG tablet TAKE 1 TABLET (75 MG TOTAL) BY MOUTH DAILY WITH BREAKFAST. 90 tablet 2  . denosumab (PROLIA) 60 MG/ML SOLN injection Inject 60 mg into the skin every 6 (six) months. Administer in upper arm, thigh, or abdomen    . desvenlafaxine (PRISTIQ) 100 MG 24 hr tablet Take 100 mg  by mouth at bedtime 30 tablet 0  . desvenlafaxine (PRISTIQ) 50 MG 24 hr tablet TAKE 1 TABLET BY MOUTH AT BEDTIME 90 tablet 1  . diclofenac sodium (VOLTAREN) 1 % GEL APPLY 4G TOPICALLY 4 TIMES DAILY 100 g 1  . diphenoxylate-atropine (LOMOTIL) 2.5-0.025 MG tablet Take 1 tablet by mouth 4 (four) times daily as needed for diarrhea or loose stools. 30 tablet 0  . gabapentin (NEURONTIN) 300 MG capsule 1 po daily and 2 po qhs 180 capsule 3  . levothyroxine (SYNTHROID, LEVOTHROID) 125 MCG tablet Take 0.5 tablets (62.5 mcg total) by mouth daily. 90 tablet 1  . levothyroxine (SYNTHROID, LEVOTHROID) 137 MCG tablet Take 1 tablet (137 mcg total) by mouth 2 (two) times a week. 24 tablet 0  . lidocaine (LIDODERM) 5 % Place 1 patch onto the skin daily. Remove & Discard patch within 12 hours or as directed by MD (Patient taking differently: Place 1 patch onto the skin daily as needed (for pain). Remove & Discard patch within 12 hours or as directed by MD) 30 patch 12  . Menthol, Topical Analgesic, (MINERAL ICE EX) Apply 1 application to affected sites one to two times a day as needed (for pain)    . metoprolol succinate (TOPROL XL) 25 MG 24 hr tablet Take 1 tablet (25 mg total) by mouth daily. 90 tablet 3  . MYRBETRIQ 25 MG TB24 tablet TAKE 1 TABLET BY MOUTH EVERY DAY 30 tablet 2   . nitroGLYCERIN (NITROSTAT) 0.4 MG SL tablet PLACE 1 TABLET (0.4 MG TOTAL) UNDER THE TONGUE EVERY 5 (FIVE) MINUTES AS NEEDED. 25 tablet 2  . Omega-3 Fatty Acids (FISH OIL PO) Take 1 capsule by mouth daily.     . pantoprazole (PROTONIX) 40 MG tablet TAKE 1 TABLET BY MOUTH EVERY DAY 90 tablet 1  . topiramate (TOPAMAX) 50 MG tablet Take 1 tablet (50 mg total) by mouth 2 (two) times daily. 180 tablet 2  . traZODone (DESYREL) 50 MG tablet TAKE 1 TABLET BY MOUTH EVERYDAY AT BEDTIME 90 tablet 1  . clonazePAM (KLONOPIN) 0.5 MG tablet Take 1 tablet (0.5 mg total) by mouth at bedtime. 30 tablet 0   No current facility-administered medications for this visit.    Allergies  Allergen Reactions  . Hydromorphone Rash  . Levofloxacin Other (See Comments)    Hallucinations  . Meperidine Nausea And Vomiting and Other (See Comments)    Also "doesn't work well for my pain"  . Broccoli [Brassica Oleracea Italica] Nausea Only and Other (See Comments)    Causes stomach pain also  . Adhesive [Tape] Rash     Addendum to correct incorrect date due to templating error

## 2018-07-08 NOTE — Telephone Encounter (Signed)
This encounter was created in error - please disregard.

## 2018-07-19 ENCOUNTER — Other Ambulatory Visit: Payer: Self-pay | Admitting: Family Medicine

## 2018-07-19 ENCOUNTER — Other Ambulatory Visit: Payer: Self-pay | Admitting: Physician Assistant

## 2018-07-19 DIAGNOSIS — E039 Hypothyroidism, unspecified: Secondary | ICD-10-CM

## 2018-07-21 ENCOUNTER — Encounter: Payer: Self-pay | Admitting: Physician Assistant

## 2018-07-21 DIAGNOSIS — F333 Major depressive disorder, recurrent, severe with psychotic symptoms: Secondary | ICD-10-CM | POA: Diagnosis not present

## 2018-07-22 ENCOUNTER — Other Ambulatory Visit: Payer: Self-pay | Admitting: Physician Assistant

## 2018-07-22 DIAGNOSIS — M810 Age-related osteoporosis without current pathological fracture: Secondary | ICD-10-CM

## 2018-07-22 NOTE — Progress Notes (Signed)
Orders placed for CMP prior to Prolia.

## 2018-07-26 DIAGNOSIS — M419 Scoliosis, unspecified: Secondary | ICD-10-CM | POA: Diagnosis not present

## 2018-07-26 DIAGNOSIS — M7918 Myalgia, other site: Secondary | ICD-10-CM | POA: Diagnosis not present

## 2018-07-26 DIAGNOSIS — M47816 Spondylosis without myelopathy or radiculopathy, lumbar region: Secondary | ICD-10-CM | POA: Diagnosis not present

## 2018-07-27 ENCOUNTER — Encounter: Payer: Self-pay | Admitting: Physician Assistant

## 2018-07-28 DIAGNOSIS — M810 Age-related osteoporosis without current pathological fracture: Secondary | ICD-10-CM | POA: Diagnosis not present

## 2018-07-28 LAB — COMPLETE METABOLIC PANEL WITH GFR
AG Ratio: 1.9 (calc) (ref 1.0–2.5)
ALT: 24 U/L (ref 6–29)
AST: 18 U/L (ref 10–35)
Albumin: 4.2 g/dL (ref 3.6–5.1)
Alkaline phosphatase (APISO): 45 U/L (ref 37–153)
BUN/Creatinine Ratio: 17 (calc) (ref 6–22)
BUN: 19 mg/dL (ref 7–25)
CO2: 28 mmol/L (ref 20–32)
Calcium: 10.2 mg/dL (ref 8.6–10.4)
Chloride: 109 mmol/L (ref 98–110)
Creat: 1.12 mg/dL — ABNORMAL HIGH (ref 0.50–0.99)
GFR, Est African American: 61 mL/min/{1.73_m2} (ref >=60)
GFR, Est Non African American: 53 mL/min/{1.73_m2} — ABNORMAL LOW (ref >=60)
Globulin: 2.2 g/dL (calc) (ref 1.9–3.7)
Glucose, Bld: 100 mg/dL — ABNORMAL HIGH (ref 65–99)
Potassium: 4.3 mmol/L (ref 3.5–5.3)
Sodium: 144 mmol/L (ref 135–146)
Total Bilirubin: 0.4 mg/dL (ref 0.2–1.2)
Total Protein: 6.4 g/dL (ref 6.1–8.1)

## 2018-07-29 ENCOUNTER — Other Ambulatory Visit: Payer: Self-pay

## 2018-07-29 DIAGNOSIS — N289 Disorder of kidney and ureter, unspecified: Secondary | ICD-10-CM

## 2018-07-29 NOTE — Progress Notes (Signed)
Dr. Stanford Breed sent me CMP he drew. Serum creatine/kidney function up some from your baseline. Just barely out of range. Make sure staying hydrated. I would recheck in 4 weeks with bmp.  Any new medications prescription or OTC?

## 2018-08-07 ENCOUNTER — Other Ambulatory Visit: Payer: Self-pay | Admitting: Family Medicine

## 2018-08-07 DIAGNOSIS — R202 Paresthesia of skin: Secondary | ICD-10-CM

## 2018-08-09 DIAGNOSIS — F333 Major depressive disorder, recurrent, severe with psychotic symptoms: Secondary | ICD-10-CM | POA: Diagnosis not present

## 2018-08-10 ENCOUNTER — Other Ambulatory Visit: Payer: Self-pay | Admitting: *Deleted

## 2018-08-12 ENCOUNTER — Other Ambulatory Visit: Payer: Self-pay | Admitting: Family Medicine

## 2018-08-12 DIAGNOSIS — R202 Paresthesia of skin: Secondary | ICD-10-CM

## 2018-08-12 NOTE — Telephone Encounter (Signed)
Pt requesting 90 day supply

## 2018-08-17 ENCOUNTER — Other Ambulatory Visit: Payer: Self-pay | Admitting: Neurology

## 2018-08-17 ENCOUNTER — Other Ambulatory Visit: Payer: Self-pay | Admitting: Physician Assistant

## 2018-08-17 MED ORDER — LEVOTHYROXINE SODIUM 137 MCG PO TABS: ORAL_TABLET | ORAL | 1 refills | Status: DC

## 2018-08-17 NOTE — Telephone Encounter (Signed)
Patient states she is taking Levothyroxine 137 mcg 1/2 tablet 5 days a week, 1 tablet 2 days a week. She states she has been taking it like that for months, back when her last TSH was drawn, but it wasn't reported that way when medications were last reconciled.   She wants a refill on Levothyroxine to take this way. She states no longer on 125 mcg at all. Please advise if okay to fill? She is going out of town tomorrow.

## 2018-08-17 NOTE — Telephone Encounter (Signed)
Patient made aware RX sent to pharmacy. 125 mcg dose deleted from med list so no further confusion.

## 2018-08-17 NOTE — Addendum Note (Signed)
Addended byAnnamaria Helling on: 08/17/2018 04:09 PM   Modules accepted: Orders

## 2018-08-26 DIAGNOSIS — N289 Disorder of kidney and ureter, unspecified: Secondary | ICD-10-CM | POA: Diagnosis not present

## 2018-08-27 DIAGNOSIS — M47816 Spondylosis without myelopathy or radiculopathy, lumbar region: Secondary | ICD-10-CM | POA: Diagnosis not present

## 2018-08-27 DIAGNOSIS — M7061 Trochanteric bursitis, right hip: Secondary | ICD-10-CM | POA: Diagnosis not present

## 2018-08-27 LAB — BASIC METABOLIC PANEL WITH GFR
BUN: 15 mg/dL (ref 7–25)
CO2: 27 mmol/L (ref 20–32)
Calcium: 9.5 mg/dL (ref 8.6–10.4)
Chloride: 107 mmol/L (ref 98–110)
Creat: 0.97 mg/dL (ref 0.50–0.99)
GFR, Est African American: 73 mL/min/{1.73_m2} (ref >=60)
GFR, Est Non African American: 63 mL/min/{1.73_m2} (ref >=60)
Glucose, Bld: 95 mg/dL (ref 65–99)
Potassium: 4.1 mmol/L (ref 3.5–5.3)
Sodium: 142 mmol/L (ref 135–146)

## 2018-08-27 NOTE — Progress Notes (Signed)
Labs look great. Make sure scheduled for 6 month prolia injection.

## 2018-08-31 DIAGNOSIS — M47816 Spondylosis without myelopathy or radiculopathy, lumbar region: Secondary | ICD-10-CM | POA: Diagnosis not present

## 2018-09-01 DIAGNOSIS — F333 Major depressive disorder, recurrent, severe with psychotic symptoms: Secondary | ICD-10-CM | POA: Diagnosis not present

## 2018-09-06 DIAGNOSIS — F333 Major depressive disorder, recurrent, severe with psychotic symptoms: Secondary | ICD-10-CM | POA: Diagnosis not present

## 2018-09-07 ENCOUNTER — Ambulatory Visit (INDEPENDENT_AMBULATORY_CARE_PROVIDER_SITE_OTHER): Payer: BC Managed Care – PPO | Admitting: Osteopathic Medicine

## 2018-09-07 ENCOUNTER — Other Ambulatory Visit: Payer: Self-pay

## 2018-09-07 VITALS — BP 111/53 | HR 63 | Wt 117.0 lb

## 2018-09-07 DIAGNOSIS — M810 Age-related osteoporosis without current pathological fracture: Secondary | ICD-10-CM | POA: Diagnosis not present

## 2018-09-07 MED ORDER — DENOSUMAB 60 MG/ML ~~LOC~~ SOSY
60.0000 mg | PREFILLED_SYRINGE | Freq: Once | SUBCUTANEOUS | Status: AC
  Administered 2018-09-07: 60 mg via SUBCUTANEOUS

## 2018-09-07 NOTE — Progress Notes (Signed)
   Subjective:    Patient ID: Leslie Roberson, female    DOB: 1955-08-09, 63 y.o.   MRN: [id number]  HPI Patient is here for a Prolia injection. Patient reports taking calcium and vitamin D daily. Patient's calcium levels and kidney function were within normal limits    Review of Systems     Objective:   Physical Exam        Assessment & Plan:  Patient tolerated injection well in left arm without any complications.

## 2018-09-10 DIAGNOSIS — K219 Gastro-esophageal reflux disease without esophagitis: Secondary | ICD-10-CM | POA: Diagnosis not present

## 2018-09-10 DIAGNOSIS — R1031 Right lower quadrant pain: Secondary | ICD-10-CM | POA: Diagnosis not present

## 2018-09-10 DIAGNOSIS — F329 Major depressive disorder, single episode, unspecified: Secondary | ICD-10-CM | POA: Diagnosis not present

## 2018-09-10 DIAGNOSIS — Z7901 Long term (current) use of anticoagulants: Secondary | ICD-10-CM | POA: Diagnosis not present

## 2018-09-10 DIAGNOSIS — Z7982 Long term (current) use of aspirin: Secondary | ICD-10-CM | POA: Diagnosis not present

## 2018-09-10 DIAGNOSIS — N132 Hydronephrosis with renal and ureteral calculous obstruction: Secondary | ICD-10-CM | POA: Diagnosis not present

## 2018-09-10 DIAGNOSIS — Z888 Allergy status to other drugs, medicaments and biological substances status: Secondary | ICD-10-CM | POA: Diagnosis not present

## 2018-09-10 DIAGNOSIS — R350 Frequency of micturition: Secondary | ICD-10-CM | POA: Diagnosis not present

## 2018-09-10 DIAGNOSIS — Z885 Allergy status to narcotic agent status: Secondary | ICD-10-CM | POA: Diagnosis not present

## 2018-09-10 DIAGNOSIS — Z79899 Other long term (current) drug therapy: Secondary | ICD-10-CM | POA: Diagnosis not present

## 2018-09-10 DIAGNOSIS — F419 Anxiety disorder, unspecified: Secondary | ICD-10-CM | POA: Diagnosis not present

## 2018-09-10 DIAGNOSIS — N202 Calculus of kidney with calculus of ureter: Secondary | ICD-10-CM | POA: Diagnosis not present

## 2018-09-14 DIAGNOSIS — F333 Major depressive disorder, recurrent, severe with psychotic symptoms: Secondary | ICD-10-CM | POA: Diagnosis not present

## 2018-09-14 DIAGNOSIS — R3915 Urgency of urination: Secondary | ICD-10-CM | POA: Diagnosis not present

## 2018-09-14 DIAGNOSIS — N202 Calculus of kidney with calculus of ureter: Secondary | ICD-10-CM | POA: Diagnosis not present

## 2018-09-22 ENCOUNTER — Other Ambulatory Visit: Payer: Self-pay | Admitting: Physician Assistant

## 2018-09-22 DIAGNOSIS — N3281 Overactive bladder: Secondary | ICD-10-CM

## 2018-09-27 ENCOUNTER — Encounter: Payer: Self-pay | Admitting: Physician Assistant

## 2018-09-27 ENCOUNTER — Ambulatory Visit (INDEPENDENT_AMBULATORY_CARE_PROVIDER_SITE_OTHER): Payer: BC Managed Care – PPO | Admitting: Physician Assistant

## 2018-09-27 VITALS — BP 106/68 | HR 84 | Temp 97.9°F | Ht <= 58 in | Wt 118.0 lb

## 2018-09-27 DIAGNOSIS — L600 Ingrowing nail: Secondary | ICD-10-CM

## 2018-09-27 NOTE — Patient Instructions (Signed)
Fingernail or Toenail Removal, Adult, Care After This sheet gives you information about how to care for yourself after your procedure. Your health care provider may also give you more specific instructions. If you have problems or questions, contact your health care provider. What can I expect after the procedure? After the procedure, it is common to have:  Pain.  Redness.  Swelling.  Soreness. Follow these instructions at home:  If you have a splint: ? Do not put pressure on any part of the splint until it is fully hardened. This may take several hours. ? Wear the splint as told by your health care provider. Remove it only as told by your health care provider. ? Loosen the splint if your fingers or toes tingle, become numb, or turn cold and blue. ? Keep the splint clean. ? If the splint is not waterproof:  Do not let it get wet.  Cover it with a watertight covering when you take a bath or a shower. Wound care   Follow instructions from your health care provider about how to take care of your wound. Make sure you: ? Wash your hands with soap and water before you change your bandage (dressing). If soap and water are not available, use hand sanitizer. ? Change your dressing as told by your health care provider. ? Keep your dressing dry until your health care provider says it can be removed. ? Leave stitches (sutures), skin glue, or adhesive strips in place. These skin closures may need to stay in place for 2 weeks or longer. If adhesive strip edges start to loosen and curl up, you may trim the loose edges. Do not remove adhesive strips completely unless your health care provider tells you to do that.  Check your wound every day for signs of infection. Check for: ? More redness, swelling, or pain. ? More fluid or blood. ? Warmth. ? Pus or a bad smell. Managing pain, stiffness, and swelling  Move your fingers or toes often to avoid stiffness and to lessen swelling.  Raise  (elevate) the injured area above the level of your heart while you are sitting or lying down. You may need to keep your finger or toe raised or supported on a pillow for 24 hours or as told by your health care provider.  Soak your hand or foot in warm, soapy water for 10-20 minutes, 3 times a day or as told by your health care provider. Medicine  Take over-the-counter and prescription medicines only as told by your health care provider.  If you were prescribed an antibiotic medicine, use it as told by your health care provider. Do not stop using the antibiotic even if your condition improves. General instructions  If you were given a shoe to wear, wear it as told by your health care provider.  Keep all follow-up visits as told by your health care provider. This is important. Contact a health care provider if:  You have more redness, swelling, or pain around your wound.  You have more fluid or blood coming from your wound.  Your wound feels warm to the touch.  You have pus or a bad smell coming from your wound.  You have a fever.  Your finger or toe looks blue or black. This information is not intended to replace advice given to you by your health care provider. Make sure you discuss any questions you have with your health care provider. Document Released: 04/07/2014 Document Revised: 02/27/2017 Document Reviewed: 09/24/2015 Elsevier Patient  Education  2020 Elsevier Inc.  

## 2018-09-27 NOTE — Progress Notes (Signed)
Subjective:    Patient ID: Leslie Roberson, female    DOB: 06-27-1955, 63 y.o.   MRN: [id number]  HPI Pt presents to the clinic with left medial great toe pain from ingrown toenail. She has been trying to see if it would just "grow out". No improvement and seems to be worsening. She would like removed.   .. Active Ambulatory Problems    Diagnosis Date Noted  . GERD (gastroesophageal reflux disease) 11/27/2015  . Osteoporosis 11/27/2015  . GAD (generalized anxiety disorder) 11/27/2015  . MDD (major depressive disorder), recurrent, in full remission ([place]) 11/27/2015  . Chronic dryness of both eyes 11/27/2015  . Hypothyroidism 11/27/2015  . Asthma, chronic 11/27/2015  . Insomnia 11/27/2015  . Seborrheic keratoses 11/27/2015  . Diverticulitis of colon 12/21/2015  . Hip bursitis 12/25/2015  . Biceps tendonitis on right 12/25/2015  . Idiopathic scoliosis 01/22/2016  . Vaginal atrophy 01/31/2016  . Rosacea 02/04/2016  . Right knee pain 02/19/2016  . Paresthesia 04/03/2016  . Facet hypertrophy of lumbosacral region 04/03/2016  . Chronic back pain 04/03/2016  . Bad taste in mouth 07/26/2016  . No energy 09/15/2016  . Lump in throat 01/27/2017  . Dysphagia 01/27/2017  . Chronic tension-type headache, intractable 02/05/2017  . Prediabetes 02/05/2017  . Right leg pain 02/06/2017  . Atypical chest pain 05/24/2017  . Itchy scalp 05/24/2017  . Pneumothorax 06/25/2017  . Angina, class III ([place]) 07/07/2017  . Coronary artery disease involving native coronary artery of native heart with angina pectoris ([place])   . Tongue mass 08/14/2017  . Excessive sweating 09/22/2017  . Rhinorrhea 01/04/2018  . Arthralgia 02/05/2018  . Nasal dryness 02/06/2018   Resolved Ambulatory Problems    Diagnosis Date Noted  . Acute medial meniscus tear 04/23/2016  . Left knee pain 04/28/2016  . Mild intermittent asthma without complication 62/44/6950   Past Medical History:  Diagnosis Date  . Anxiety    . Arthritis   . Coronary artery disease   . Depression   . History of blood transfusion   . Pneumonia 2016  . Pre-diabetes   . Thyroid disease       Review of Systems  All other systems reviewed and are negative.      Objective:   Physical Exam Vitals signs reviewed.  Constitutional:      Appearance: Normal appearance.  HENT:     Head: Normocephalic.  Cardiovascular:     Rate and Rhythm: Normal rate and regular rhythm.  Skin:    Comments: Left medial great toenail pain to palpation at the lateral base. No warmth or redness.   Neurological:     General: No focal deficit present.     Mental Status: She is alert and oriented to person, place, and time.  Psychiatric:        Mood and Affect: Mood normal.           Assessment & Plan:  Marland KitchenMarland KitchenElidia was seen today for ingrown toenail.  Diagnoses and all orders for this visit:  Ingrown toenail of left foot   Toenail Avulsion Procedure Note  Pre-operative Diagnosis: Left Ingrown Great toenail   Post-operative Diagnosis: Left Ingrown Great toenail  Indications: pain  Anesthesia: Lidocaine 1% without epinephrine without added sodium bicarbonate  Procedure Details  History of allergy to iodine: no  The risks (including bleeding and infection) and benefits of the  procedure and Verbal informed consent obtained.  After digital block anesthesia was obtained, a tourniquet was applied for hemostasis during  the procedure.  After prepping with Betadine, the offending edge of the nail was freed from the nailbed and perionychium, and then split with scissors and removed with  forceps.  All visible granulation tissue is debrided. Antibiotic and bulky dressing was applied.   Findings: Ingrown toenail  Complications: none.  Plan: 1. Soak the foot twice daily. Change dressing twice daily until healed over. 2. Warning signs of infection were reviewed.   3. Recommended that the patient use OTC acetaminophen as needed for  pain.  4. Return in 2 weeks.

## 2018-09-29 DIAGNOSIS — F333 Major depressive disorder, recurrent, severe with psychotic symptoms: Secondary | ICD-10-CM | POA: Diagnosis not present

## 2018-10-05 DIAGNOSIS — M47816 Spondylosis without myelopathy or radiculopathy, lumbar region: Secondary | ICD-10-CM | POA: Diagnosis not present

## 2018-10-12 ENCOUNTER — Other Ambulatory Visit: Payer: Self-pay | Admitting: Cardiology

## 2018-10-12 DIAGNOSIS — E785 Hyperlipidemia, unspecified: Secondary | ICD-10-CM

## 2018-10-13 DIAGNOSIS — F333 Major depressive disorder, recurrent, severe with psychotic symptoms: Secondary | ICD-10-CM | POA: Diagnosis not present

## 2018-10-18 ENCOUNTER — Other Ambulatory Visit (HOSPITAL_COMMUNITY): Payer: Self-pay | Admitting: Physician Assistant

## 2018-11-15 DIAGNOSIS — F333 Major depressive disorder, recurrent, severe with psychotic symptoms: Secondary | ICD-10-CM | POA: Diagnosis not present

## 2018-11-18 DIAGNOSIS — M7061 Trochanteric bursitis, right hip: Secondary | ICD-10-CM | POA: Diagnosis not present

## 2018-11-18 DIAGNOSIS — M47816 Spondylosis without myelopathy or radiculopathy, lumbar region: Secondary | ICD-10-CM | POA: Diagnosis not present

## 2018-11-21 ENCOUNTER — Other Ambulatory Visit: Payer: Self-pay | Admitting: Physician Assistant

## 2018-11-21 DIAGNOSIS — K2100 Gastro-esophageal reflux disease with esophagitis, without bleeding: Secondary | ICD-10-CM

## 2018-11-22 ENCOUNTER — Telehealth: Payer: Self-pay | Admitting: *Deleted

## 2018-11-22 NOTE — Telephone Encounter (Signed)
   [facility] Pre-operative Risk Assessment    Request for surgical clearance:  1. What type of surgery is being performed? EPIDURAL STEROID INJECTION  2. When is this surgery scheduled? TBD  3. What type of clearance is required (medical clearance vs. Pharmacy clearance to hold med vs. Both)? BOTH  4. Are there any medications that need to be held prior to surgery and how long? PLAVIX- 7 DAYS  5. Practice name and name of physician performing surgery? Zonia Kief MD  6. What is your office phone number [phone]   7.   What is your office fax number  [phone]  8.   Anesthesia type (None, local, MAC, general) ? TOPICAL   Fredia Beets 11/22/2018, 7:40 AM  _________________________________________________________________   (provider comments below)

## 2018-11-22 NOTE — Telephone Encounter (Signed)
Appt scheduled 11-29-2018 with [facility] cor cardiac clearance, pt will arrive early alone and wear a mask

## 2018-11-22 NOTE — Telephone Encounter (Signed)
Forwarded via [employer] function to requesting providers office

## 2018-11-22 NOTE — Telephone Encounter (Signed)
Dr. Stanford Breed, Leslie Roberson has a PMH of asthma, GERD, pre-DM, hypothyroidism and CAD. She had LHC with 2 vessel disease treated with DES to RCA and LAD 06/2017. She had residual (30%) stenosis in the LAD proximal and distal to the stent placed.  She is scheduled for an epidural steroid injection. She is still on ASA and plavix. She was last seen in clinic 09/2017 and was doing well.  Since she more than 1 year out from her stent, may she hold plavix for 7 days prior to injection?   Since she has not been seen in 1 year, I will schedule a follow up appt for her. Moving forward, do you want to keep her on DAPT?  Thanks Angie

## 2018-11-22 NOTE — Telephone Encounter (Signed)
   Primary Cardiologist:Brian Stanford Breed, MD  Chart reviewed as part of pre-operative protocol coverage. Because of Leslie Roberson's past medical history and time since last visit, he/she will require a follow-up visit in order to better assess preoperative cardiovascular risk.  Pre-op covering staff: - Please schedule appointment and call patient to inform them. - Please contact requesting surgeon's office via preferred method (i.e, phone, fax) to inform them of need for appointment prior to surgery.  If applicable, this message will also be routed to pharmacy pool and/or primary cardiologist for input on holding anticoagulant/antiplatelet agent as requested below so that this information is available at time of patient's appointment.   Ledora Bottcher, PA  11/22/2018, 8:36 AM

## 2018-11-22 NOTE — Telephone Encounter (Signed)
Plavix can be discontinued as it has been greater than 1 year since last PCI. Leslie Roberson

## 2018-11-22 NOTE — Telephone Encounter (Signed)
Pt will be glad to stop the Plavix

## 2018-11-23 DIAGNOSIS — M5126 Other intervertebral disc displacement, lumbar region: Secondary | ICD-10-CM | POA: Diagnosis not present

## 2018-11-24 ENCOUNTER — Telehealth: Payer: Self-pay

## 2018-11-24 NOTE — Telephone Encounter (Signed)
Refill request received via fax for clopidogrel. Per Dr. Stanford Breed, D/C clopidogrel. Patient already aware. Spoke with Cordelia Pen at [place], [place] to pharmacy.

## 2018-11-29 ENCOUNTER — Encounter: Payer: Self-pay | Admitting: Cardiology

## 2018-11-29 ENCOUNTER — Telehealth: Payer: Self-pay

## 2018-11-29 ENCOUNTER — Telehealth (INDEPENDENT_AMBULATORY_CARE_PROVIDER_SITE_OTHER): Payer: BC Managed Care – PPO | Admitting: Cardiology

## 2018-11-29 VITALS — BP 117/71 | HR 68 | Ht <= 58 in | Wt 125.0 lb

## 2018-11-29 DIAGNOSIS — I2510 Atherosclerotic heart disease of native coronary artery without angina pectoris: Secondary | ICD-10-CM

## 2018-11-29 DIAGNOSIS — F411 Generalized anxiety disorder: Secondary | ICD-10-CM

## 2018-11-29 DIAGNOSIS — M47817 Spondylosis without myelopathy or radiculopathy, lumbosacral region: Secondary | ICD-10-CM | POA: Insufficient documentation

## 2018-11-29 DIAGNOSIS — E785 Hyperlipidemia, unspecified: Secondary | ICD-10-CM

## 2018-11-29 DIAGNOSIS — Z01818 Encounter for other preprocedural examination: Secondary | ICD-10-CM | POA: Insufficient documentation

## 2018-11-29 HISTORY — DX: Hyperlipidemia, unspecified: E78.5

## 2018-11-29 NOTE — Progress Notes (Signed)
Virtual Visit via Video Note   This visit type was conducted due to national recommendations for restrictions regarding the COVID-19 Pandemic (e.g. social distancing) in an effort to limit this patient's exposure and mitigate transmission in our community.  Due to her co-morbid illnesses, this patient is at least at moderate risk for complications without adequate follow up.  This format is felt to be most appropriate for this patient at this time.  All issues noted in this document were discussed and addressed.  A limited physical exam was performed with this format.  Please refer to the patient's chart for her consent to telehealth for [facility].   Date:  11/29/2018   ID:  Leslie Roberson, DOB 15-May-1955, MRN [id number]  Patient Location: Home Provider Location: Home  PCP:  Donella Stade, PA-C  Cardiologist:  Kirk Ruths, MD  Electrophysiologist:  None   Evaluation Performed:  Follow-Up Visit  Chief Complaint:  None  History of Present Illness:    Leslie Roberson is a 63 y.o. female with CAD, status post RCA and LAD PCI with DES April 2019.  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  She last saw Dr. Stanford Breed July 2019.  She is done well from a cardiac standpoint since.  She has been having back problems with scoliosis and arthritis and is to have a back injection.  Dr. Jacalyn Lefevre told her she can stop the Plavix for this and not resume.  She was contacted today for a video visit for preop clearance.  She is not exercising regularly because of her back problems but is able to get around and do her housework and go up and down stairs without chest pain.  The patient does not have symptoms concerning for COVID-19 infection (fever, chills, cough, or new shortness of breath).    Past Medical History:  Diagnosis Date  . Anxiety   . Arthritis    "right little finger; base of thumb left hand; spine"  (07/07/2017)  . Asthma, chronic 11/27/2015  . Chronic back pain   . Coronary artery disease    4/19 PCI/DEx2 to LAD, and PCI/DES x1 to RCA, normal EF  . Depression   . Diverticulitis of colon 12/21/2015   Colonoscopy every 5 years/digestive health.   . GERD (gastroesophageal reflux disease) 11/27/2015  . History of blood transfusion    "related to spinal fusions"  . Hypothyroidism   . Idiopathic scoliosis 01/22/2016  . Osteoporosis 11/27/2015   On prolia.   . Pneumonia 2016  . Pneumothorax 06/26/2017  . Pre-diabetes   . Thyroid disease    Past Surgical History:  Procedure Laterality Date  . ABDOMINAL HYSTERECTOMY    . bladder mesh     S/P bladder sling"  . CORONARY STENT INTERVENTION N/A 07/07/2017   Procedure: CORONARY STENT INTERVENTION;  Surgeon: Leonie Man, MD;  Location: [place] [facility];  Service: Cardiovascular;  Laterality: N/A;  . ELBOW SURGERY Right    "dislocation"  . INCONTINENCE SURGERY     "didn't work"  . INTRAVASCULAR PRESSURE WIRE/FFR STUDY N/A 07/07/2017   Procedure: INTRAVASCULAR PRESSURE WIRE/FFR STUDY;  Surgeon: Leonie Man, MD;  Location: [place] [facility];  Service: Cardiovascular;  Laterality: N/A;  . JOINT REPLACEMENT    . LEFT HEART CATH AND CORONARY ANGIOGRAPHY N/A 07/07/2017   Procedure: LEFT HEART CATH AND CORONARY ANGIOGRAPHY;  Surgeon: Leonie Man, MD;  Location: [place]  [facility];  Service: Cardiovascular;  Laterality: N/A;  . SPINAL FUSION  ~ 1970 X 3   "below neck - lower back"  . TONSILLECTOMY    . TOTAL HIP ARTHROPLASTY Left      Current Meds  Medication Sig  . albuterol (PROVENTIL HFA;VENTOLIN HFA) 108 (90 Base) MCG/ACT inhaler Inhale 1-2 puffs into the lungs every 4 (four) hours as needed for wheezing or shortness of breath.  Marland Kitchen aspirin EC 81 MG tablet Take 81 mg by mouth at bedtime.  Marland Kitchen atorvastatin (LIPITOR) 20 MG tablet Take 1 tablet (20 mg total) by mouth daily. Please schedule appt for future refills. 1st attempt  .  azelastine (ASTELIN) 0.1 % nasal spray Place 1 spray into both nostrils 2 (two) times daily. Use in each nostril as directed  . busPIRone (BUSPAR) 15 MG tablet Take 1 tablet (15 mg total) by mouth daily.  . butalbital-acetaminophen-caffeine (FIORICET, ESGIC) 50-325-40 MG tablet Take 1-2 tablets by mouth every 6 (six) hours as needed for headache.  . calcium-vitamin D (OSCAL WITH D) 500-200 MG-UNIT tablet Take 1 tablet by mouth daily.  . clonazePAM (KLONOPIN) 0.5 MG tablet TAKE 1/2 TABLET BY MOUTH TWICE A DAY AS NEEDED FOR ANXIETY  . denosumab (PROLIA) 60 MG/ML SOLN injection Inject 60 mg into the skin every 6 (six) months. Administer in upper arm, thigh, or abdomen  . desvenlafaxine (PRISTIQ) 100 MG 24 hr tablet Take 100 mg by mouth at bedtime  . desvenlafaxine (PRISTIQ) 50 MG 24 hr tablet TAKE 1 TABLET BY MOUTH AT BEDTIME  . diclofenac sodium (VOLTAREN) 1 % GEL APPLY 4G TOPICALLY 4 TIMES DAILY  . gabapentin (NEURONTIN) 300 MG capsule TAKE 1 CAP AT BEDTIME X1 WEEK,1 CAP TWICE DAILY X1 WEEK THEN 1 CAP 3X DAILY.MAY DOUBLE WEEKLY=3600MG  (Patient taking differently: Patient take 1 in the am and 2 at bedtime)  . levothyroxine (SYNTHROID) 137 MCG tablet 1/2 tablet daily 5 days a week, 1 tablet daily 2 days a week  . lidocaine (LIDODERM) 5 % Place 1 patch onto the skin daily. Remove & Discard patch within 12 hours or as directed by MD (Patient taking differently: Place 1 patch onto the skin daily as needed (for pain). Remove & Discard patch within 12 hours or as directed by MD)  . Menthol, Topical Analgesic, (MINERAL ICE EX) Apply 1 application to affected sites one to two times a day as needed (for pain)  . metoprolol succinate (TOPROL XL) 25 MG 24 hr tablet Take 1 tablet (25 mg total) by mouth daily.  Marland Kitchen MYRBETRIQ 50 MG TB24 tablet Take 50 mg by mouth daily.  . nitroGLYCERIN (NITROSTAT) 0.4 MG SL tablet PLACE 1 TABLET (0.4 MG TOTAL) UNDER THE TONGUE EVERY 5 (FIVE) MINUTES AS NEEDED.  Marland Kitchen Omega-3 Fatty Acids  (FISH OIL PO) Take 1 capsule by mouth daily.   . pantoprazole (PROTONIX) 40 MG tablet TAKE 1 TABLET BY MOUTH EVERY DAY  . REXULTI 1 MG TABS Take 1 tablet by mouth daily.   Marland Kitchen topiramate (TOPAMAX) 50 MG tablet Take 1 tablet (50 mg total) by mouth 2 (two) times daily. (Patient taking differently: Take 50 mg by mouth at bedtime. )     Allergies:   Hydromorphone, Levofloxacin, Meperidine, Broccoli [brassica oleracea italica], and Adhesive [tape]   Social History   Tobacco Use  . Smoking status: Never Smoker  . Smokeless tobacco: Never Used  Substance Use Topics  . Alcohol use: Yes    Comment: 07/07/2017 "5-6 times/year; 1 drink at  each occasion"  . Drug use: No     Family Hx: The patient's family history includes COPD in her paternal aunt; Cancer in her mother; Depression in her mother; Diabetes in her maternal aunt; Heart attack in her father; Hyperlipidemia in her father and paternal aunt; Hypertension in her father.  ROS:   Please see the history of present illness.    All other systems reviewed and are negative.   Prior CV studies:   The following studies were reviewed today:  Cath/ PCI April 2019  Labs/Other Tests and Data Reviewed:    EKG:  No ECG reviewed.  Recent Labs: 07/28/2018: ALT 24 08/26/2018: BUN 15; Creat 0.97; Potassium 4.1; Sodium 142   Recent Lipid Panel Lab Results  Component Value Date/Time   CHOL 121 12/09/2017 09:09 AM   TRIG 54 12/09/2017 09:09 AM   HDL 64 12/09/2017 09:09 AM   CHOLHDL 1.9 12/09/2017 09:09 AM   CHOLHDL 1.8 10/23/2017 10:22 AM   LDLCALC 46 12/09/2017 09:09 AM   LDLCALC 37 10/23/2017 10:22 AM    Wt Readings from Last 3 Encounters:  11/29/18 125 lb (56.7 kg)  09/27/18 118 lb (53.5 kg)  09/07/18 117 lb (53.1 kg)     Objective:    Vital Signs:  BP 117/71   Pulse 68   Ht 4\' 10"  (1.473 m)   Wt 125 lb (56.7 kg)   BMI 26.13 kg/m    VITAL SIGNS:  reviewed  ASSESSMENT & PLAN:    Pre op clearance: Pt is an acceptable risk for  proposed procedure without further work up.  Dr Stanford Breed has told her she can stop Plavix.   CAD: S/P LAD and RCA PCI with DES April 2019.  HLD: LDL 46 Sept 2019  DJD: L-spine, scoliosis   Plan: I will forward official clearance to the operating surgeon.  F/U OV with Dr Stanford Breed in 6 months.   COVID-19 Education: The signs and symptoms of COVID-19 were discussed with the patient and how to seek care for testing (follow up with PCP or arrange E-visit).  The importance of social distancing was discussed today.  Time:   Today, I have spent 15 minutes with the patient with telehealth technology discussing the above problems.     Medication Adjustments/Labs and Tests Ordered: Current medicines are reviewed at length with the patient today.  Concerns regarding medicines are outlined above.   Tests Ordered: No orders of the defined types were placed in this encounter.   Medication Changes: No orders of the defined types were placed in this encounter.   Follow Up:  In Person Dr Stanford Breed 6 months  Signed, Kerin Ransom, PA-C  11/29/2018 1:54 PM    [place]

## 2018-11-29 NOTE — Telephone Encounter (Signed)
Virtual Visit Pre-Appointment Phone Call  "Combine, I am calling you today to discuss your upcoming appointment. We are currently trying to limit exposure to the virus that causes COVID-19 by seeing patients at home rather than in the office."  1. "What is the BEST phone number to call the day of the visit?" - include this in appointment notes  2. "Do you have or have access to (through a family member/friend) a smartphone with video capability that we can use for your visit?" a. If yes - list this number in appt notes as "cell" (if different from BEST phone #) and list the appointment type as a VIDEO visit in appointment notes b. If no - list the appointment type as a PHONE visit in appointment notes  3. Confirm consent - "In the setting of the current Covid19 crisis, you are scheduled for a (phone or video) visit with your provider on (date) at (time).  Just as we do with many in-office visits, in order for you to participate in this visit, we must obtain consent.  If you'd like, I can send this to your mychart (if signed up) or email for you to review.  Otherwise, I can obtain your verbal consent now.  All virtual visits are billed to your insurance company just like a normal visit would be.  By agreeing to a virtual visit, we'd like you to understand that the technology does not allow for your provider to perform an examination, and thus may limit your provider's ability to fully assess your condition. If your provider identifies any concerns that need to be evaluated in person, we will make arrangements to do so.  Finally, though the technology is pretty good, we cannot assure that it will always work on either your or our end, and in the setting of a video visit, we may have to convert it to a phone-only visit.  In either situation, we cannot ensure that we have a secure connection.  Are you willing to proceed?" STAFF: Did the patient verbally acknowledge consent to telehealth  visit? Document YES/NO here: YES  4. Advise patient to be prepared - "Two hours prior to your appointment, go ahead and check your blood pressure, pulse, oxygen saturation, and your weight (if you have the equipment to check those) and write them all down. When your visit starts, your provider will ask you for this information. If you have an Apple Watch or Kardia device, please plan to have heart rate information ready on the day of your appointment. Please have a pen and paper handy nearby the day of the visit as well."  5. Give patient instructions for MyChart download to smartphone OR Doximity/Doxy.me as below if video visit (depending on what platform provider is using)  6. Inform patient they will receive a phone call 15 minutes prior to their appointment time (may be from unknown caller ID) so they should be prepared to answer    Leslie Roberson has been deemed a candidate for a follow-up tele-health visit to limit community exposure during the Covid-19 pandemic. I spoke with the patient via phone to ensure availability of phone/video source, confirm preferred email & phone number, and discuss instructions and expectations.  I reminded Leslie Roberson to be prepared with any vital sign and/or heart rhythm information that could potentially be obtained via home monitoring, at the time of her visit. I reminded Leslie Roberson to expect a phone  call prior to her visit.  Harold Hedge, CMA 11/29/2018 1:34 PM   INSTRUCTIONS FOR DOWNLOADING THE MYCHART APP TO SMARTPHONE  - The patient must first make sure to have activated MyChart and know their login information - If Apple, go to [employer] and type in MyChart in the search bar and download the app. If Android, ask patient to go to [employer] and type in [place] in the search bar and download the app. The app is free but as with any other app downloads, their phone may require them to verify saved payment  information or Apple/Android password.  - The patient will need to then log into the app with their MyChart username and password, and select [facility] as their healthcare provider to link the account. When it is time for your visit, go to the MyChart app, find appointments, and click Begin Video Visit. Be sure to Select Allow for your device to access the Microphone and Camera for your visit. You will then be connected, and your provider will be with you shortly.  **If they have any issues connecting, or need assistance please contact MyChart service desk [phone])**  **If using a computer, in order to ensure the best quality for their visit they will need to use either of the following Internet Browsers: [employer], or Google Chrome**  IF USING DOXIMITY or DOXY.ME - The patient will receive a link just prior to their visit by text.     FULL LENGTH CONSENT FOR TELE-HEALTH VISIT   I hereby voluntarily request, consent and authorize [facility] and its employed or contracted physicians, physician assistants, nurse practitioners or other licensed health care professionals (the Practitioner), to provide me with telemedicine health care services (the "Services") as deemed necessary by the treating Practitioner. I acknowledge and consent to receive the Services by the Practitioner via telemedicine. I understand that the telemedicine visit will involve communicating with the Practitioner through live audiovisual communication technology and the disclosure of certain medical information by electronic transmission. I acknowledge that I have been given the opportunity to request an in-person assessment or other available alternative prior to the telemedicine visit and am voluntarily participating in the telemedicine visit.  I understand that I have the right to withhold or withdraw my consent to the use of telemedicine in the course of my care at any time, without affecting my right  to future care or treatment, and that the Practitioner or I may terminate the telemedicine visit at any time. I understand that I have the right to inspect all information obtained and/or recorded in the course of the telemedicine visit and may receive copies of available information for a reasonable fee.  I understand that some of the potential risks of receiving the Services via telemedicine include:  Marland Kitchen Delay or interruption in medical evaluation due to technological equipment failure or disruption; . Information transmitted may not be sufficient (e.g. poor resolution of images) to allow for appropriate medical decision making by the Practitioner; and/or  . In rare instances, security protocols could fail, causing a breach of personal health information.  Furthermore, I acknowledge that it is my responsibility to provide information about my medical history, conditions and care that is complete and accurate to the best of my ability. I acknowledge that Practitioner's advice, recommendations, and/or decision may be based on factors not within their control, such as incomplete or inaccurate data provided by me or distortions of diagnostic images or specimens that may result  from electronic transmissions. I understand that the practice of medicine is not an exact science and that Practitioner makes no warranties or guarantees regarding treatment outcomes. I acknowledge that I will receive a copy of this consent concurrently upon execution via email to the email address I last provided but may also request a printed copy by calling the office of [facility].    I understand that my insurance will be billed for this visit.   I have read or had this consent read to me. . I understand the contents of this consent, which adequately explains the benefits and risks of the Services being provided via telemedicine.  . I have been provided ample opportunity to ask questions regarding this consent and the Services  and have had my questions answered to my satisfaction. . I give my informed consent for the services to be provided through the use of telemedicine in my medical care  By participating in this telemedicine visit I agree to the above.

## 2018-11-29 NOTE — Telephone Encounter (Addendum)
LEFT MESSAGE FOR PATIENT WITH [facility]'S RECOMMENDATIONS TO STOP PLAVIX AND FOLLOW UP WITH DR CRENSHAW IN 6 MONTHS. INFORMED PATIENT TO CALL THE OFFICE IN December TO SCHEDULE HER FOLLOW UP APPT AND THAT SHE MAY RECEIVE A LETTER AND A PHONE CALL AROUND THAT TIME. INFORMED PATIENT THAT I WOULD MAIL HER A COPY OF HER AVS WHICH IS PAPERWORK SHE WOULD NORMALLY GET IN THE OFFICE. ADVISED PATIENT TO CONTACT OFFICE WITH ANY QUESTIONS OR CONCERNS.

## 2018-11-29 NOTE — Patient Instructions (Signed)
Medication Instructions:  STOP PLAVIX If you need a refill on your cardiac medications before your next appointment, please call your pharmacy.   Lab work: None  If you have labs (blood work) drawn today and your tests are completely normal, you will receive your results only by: Marland Kitchen MyChart Message (if you have MyChart) OR . A paper copy in the mail If you have any lab test that is abnormal or we need to change your treatment, we will call you to review the results.  Testing/Procedures: None   Follow-Up: At [facility], you and your health needs are our priority.  As part of our continuing mission to provide you with exceptional heart care, we have created designated Provider Care Teams.  These Care Teams include your primary Cardiologist (physician) and Advanced Practice Providers (APPs -  Physician Assistants and Nurse Practitioners) who all work together to provide you with the care you need, when you need it. You will need a follow up appointment in 6 months.  Please call our office 2 months in advance to schedule this appointment.  You may see Kirk Ruths, MD or one of the following Advanced Practice Providers on your designated Care Team:   Kerin Ransom, PA-C Roby Lofts, [place] . Sande Rives, PA-C  Any Other Special Instructions Will Be Listed Below (If Applicable).

## 2018-11-29 NOTE — Telephone Encounter (Signed)
   Primary Cardiologist: Kirk Ruths, MD  Chart reviewed and patient contacted by phone today as part of pre-operative protocol coverage. Given past medical history and time since last visit, based on ACC/AHA guidelines, [address] would be at acceptable risk for the planned procedure without further cardiovascular testing.   Her Plavix has been discontinued, OK to hold aspirin pre op, resume when safe post op.   I will route this recommendation to the requesting party via Epic fax function and remove from pre-op pool.  Please call with questions.  Kerin Ransom, PA-C 11/29/2018, 2:04 PM

## 2018-12-07 ENCOUNTER — Other Ambulatory Visit: Payer: Self-pay | Admitting: Physician Assistant

## 2018-12-07 DIAGNOSIS — F333 Major depressive disorder, recurrent, severe with psychotic symptoms: Secondary | ICD-10-CM | POA: Diagnosis not present

## 2018-12-08 DIAGNOSIS — M5136 Other intervertebral disc degeneration, lumbar region: Secondary | ICD-10-CM | POA: Diagnosis not present

## 2018-12-21 DIAGNOSIS — F333 Major depressive disorder, recurrent, severe with psychotic symptoms: Secondary | ICD-10-CM | POA: Diagnosis not present

## 2018-12-29 DIAGNOSIS — M5136 Other intervertebral disc degeneration, lumbar region: Secondary | ICD-10-CM | POA: Diagnosis not present

## 2018-12-29 DIAGNOSIS — M7061 Trochanteric bursitis, right hip: Secondary | ICD-10-CM | POA: Diagnosis not present

## 2018-12-29 DIAGNOSIS — M47816 Spondylosis without myelopathy or radiculopathy, lumbar region: Secondary | ICD-10-CM | POA: Diagnosis not present

## 2019-01-04 ENCOUNTER — Encounter: Payer: Self-pay | Admitting: Physical Therapy

## 2019-01-04 ENCOUNTER — Other Ambulatory Visit: Payer: Self-pay

## 2019-01-04 ENCOUNTER — Other Ambulatory Visit: Payer: Self-pay | Admitting: Cardiology

## 2019-01-04 ENCOUNTER — Ambulatory Visit (INDEPENDENT_AMBULATORY_CARE_PROVIDER_SITE_OTHER): Payer: BC Managed Care – PPO | Admitting: Physical Therapy

## 2019-01-04 DIAGNOSIS — M545 Low back pain, unspecified: Secondary | ICD-10-CM

## 2019-01-04 DIAGNOSIS — E785 Hyperlipidemia, unspecified: Secondary | ICD-10-CM

## 2019-01-04 DIAGNOSIS — M6281 Muscle weakness (generalized): Secondary | ICD-10-CM

## 2019-01-04 DIAGNOSIS — R29898 Other symptoms and signs involving the musculoskeletal system: Secondary | ICD-10-CM

## 2019-01-04 NOTE — Therapy (Signed)
[facility] [place], [place], [address] Phone: [phone]   Fax:  [phone]  Physical Therapy Evaluation  Patient Details  Name: Leslie Roberson MRN: [id number] Date of Birth: 05/23/1955 Referring Provider (PT): Zonia Kief, MD   Encounter Date: 01/04/2019  PT End of Session - 01/04/19 1205    Visit Number  1    Number of Visits  12    Date for PT Re-Evaluation  02/15/19    PT Start Time  0802    PT Stop Time  0843    PT Time Calculation (min)  41 min    Activity Tolerance  Patient tolerated treatment well    Behavior During Therapy  [facility] for tasks assessed/performed       Past Medical History:  Diagnosis Date  . Anxiety   . Arthritis    "right little finger; base of thumb left hand; spine" (07/07/2017)  . Asthma, chronic 11/27/2015  . Chronic back pain   . Coronary artery disease    4/19 PCI/DEx2 to LAD, and PCI/DES x1 to RCA, normal EF  . Depression   . Diverticulitis of colon 12/21/2015   Colonoscopy every 5 years/digestive health.   . GERD (gastroesophageal reflux disease) 11/27/2015  . History of blood transfusion    "related to spinal fusions"  . Hypothyroidism   . Idiopathic scoliosis 01/22/2016  . Osteoporosis 11/27/2015   On prolia.   . Pneumonia 2016  . Pneumothorax 06/26/2017  . Pre-diabetes   . Thyroid disease     Past Surgical History:  Procedure Laterality Date  . ABDOMINAL HYSTERECTOMY    . bladder mesh     S/P bladder sling"  . CORONARY STENT INTERVENTION N/A 07/07/2017   Procedure: CORONARY STENT INTERVENTION;  Surgeon: Leonie Man, MD;  Location: [facility];  Service: Cardiovascular;  Laterality: N/A;  . ELBOW SURGERY Right    "dislocation"  . INCONTINENCE SURGERY     "didn't work"  . INTRAVASCULAR PRESSURE WIRE/FFR STUDY N/A 07/07/2017   Procedure: INTRAVASCULAR PRESSURE WIRE/FFR STUDY;  Surgeon: Leonie Man, MD;  Location: [facility];   Service: Cardiovascular;  Laterality: N/A;  . JOINT REPLACEMENT    . LEFT HEART CATH AND CORONARY ANGIOGRAPHY N/A 07/07/2017   Procedure: LEFT HEART CATH AND CORONARY ANGIOGRAPHY;  Surgeon: Leonie Man, MD;  Location: [facility];  Service: Cardiovascular;  Laterality: N/A;  . SPINAL FUSION  ~ 1970 X 3   "below neck - lower back"  . TONSILLECTOMY    . TOTAL HIP ARTHROPLASTY Left     There were no vitals filed for this visit.   Subjective Assessment - 01/04/19 0807    Subjective  Pt is a 63 y/o female who presents to OPPT for low back pain.  Pt with hx of scoliosis, and reporting the vertebrae "are now rotating the other way, causing spasms in my back."  Pt reports recent onset of pain x 9 months.  Presents today requesting dry needling.    Limitations  Walking;Sitting    Patient Stated Goals  move comfortably, be able to lift Rt leg    Currently in Pain?  Yes    Pain Score  6    up to 7.5/10; at best 6/10   Pain Location  Back    Pain Orientation  Right    Pain Descriptors / Indicators  Sharp;Cramping;Aching    Pain Type  Chronic pain;Acute pain    Pain  Onset  More than a month ago    Pain Frequency  Constant    Aggravating Factors   lifting leg up (putting on shoes, getting into car), "just the way I move"    Pain Relieving Factors  "sometimes I can't find anything," reports sometimes lidocaine works         [facility] PT Assessment - 01/04/19 0812      Assessment   Medical Diagnosis  lumbar spondylosis    Referring Provider (PT)  Zonia Kief, MD    Onset Date/Surgical Date  --   9 months   Hand Dominance  Right    Next MD Visit  5-6 weeks    Prior Therapy  multiple episodes of care      Precautions   Precautions  None      Restrictions   Weight Bearing Restrictions  No      Balance Screen   Has the patient fallen in the past 6 months  No    Has the patient had a decrease in activity level because of a fear of falling?   No    Is the patient reluctant to  leave their home because of a fear of falling?   No      Home Film/video editor residence    Living Arrangements  Spouse/significant other    Additional Comments  no stairs at home      Prior Function   Level of [place]  Retired    Medical sales representative - art projects; no regular exercise      Cognition   Overall Cognitive Status  No family/caregiver present to determine baseline cognitive functioning      Observation/Other Assessments   Focus on Therapeutic Outcomes (FOTO)   40 (60% limited; predicted 51% limited)      Posture/Postural Control   Posture/Postural Control  Postural limitations    Postural Limitations  Rounded Shoulders;Forward head   scoliosis curve; convex Rt lumbar spine     ROM / Strength   AROM / PROM / Strength  AROM;Strength      AROM   AROM Assessment Site  Lumbar    Lumbar Flexion  WNL    Lumbar Extension  WNL    Lumbar - Right Side Bend  unable - baseline with pain    Lumbar - Left Side Bend  limited 25%    Lumbar - Right Rotation  WNL    Lumbar - Left Rotation  WNL      Strength   Overall Strength Comments  unable to lie prone    Strength Assessment Site  Hip    Right/Left Hip  Right;Left    Right Hip Flexion  5/5    Right Hip ABduction  3/5    Left Hip Flexion  5/5    Left Hip ABduction  3/5      Flexibility   Soft Tissue Assessment /Muscle Length  yes    Hamstrings  WNL    Piriformis  tightness bil      Palpation   Palpation comment  trigger points in Rt glute med/min      Special Tests    Special Tests  Lumbar    Lumbar Tests  Straight Leg Raise      Straight Leg Raise   Findings  Negative                Objective measurements completed on examination:  See above findings.      [facility] Adult PT Treatment/Exercise - 01/04/19 0812      Exercises   Exercises  Lumbar      Lumbar Exercises: Stretches   Single Knee to Chest Stretch Limitations   instructed for HEP    Double Knee to Chest Stretch Limitations  instructed for HEP    Piriformis Stretch Limitations  instructed for HEP      Manual Therapy   Manual Therapy  Soft tissue mobilization    Manual therapy comments  skilled monitoring and palpation of soft tissue during DN; pt Lt sidelying    Soft tissue mobilization  Rt glute med/min       Trigger Point Dry Needling - 01/04/19 1202    Consent Given?  Yes    Education Handout Provided  Yes    Muscles Treated Back/Hip  Gluteus minimus;Gluteus medius    Gluteus Minimus Response  Twitch response elicited;Palpable increased muscle length    Gluteus Medius Response  Twitch response elicited;Palpable increased muscle length           PT Education - 01/04/19 1205    Education Details  HEP, DN    Person(s) Educated  Patient    Methods  Explanation;Handout    Comprehension  Verbalized understanding;Returned demonstration;Need further instruction          PT Long Term Goals - 01/04/19 1210      PT LONG TERM GOAL #1   Title  independent with HEP    Status  New    Target Date  02/15/19      PT LONG TERM GOAL #2   Title  improve FOTO score to </=51% limited for improved function    Status  New    Target Date  02/15/19      PT LONG TERM GOAL #3   Title  report pain < 4/10 with activity for improved function    Status  New    Target Date  02/15/19      PT LONG TERM GOAL #4   Title  demonstrate at least 4/5 bil hip abduction for improved function    Status  New    Target Date  02/15/19      PT LONG TERM GOAL #5   Title  n/a             Plan - 01/04/19 1205    Clinical Impression Statement  Pt is a 63 y/o female who presents to OPPT for Rt sided LBP.  Pt demonstrates decreased ROM (mostly at baseline due to scoliosis hx) and decreased strength with active trigger points.  Pt will benefit from PT to address deficits listed.    Personal Factors and Comorbidities  Past/Current Experience;Comorbidity 3+     Comorbidities  osteoporosis, arthritis, scoliosis, anxiety, depression, CAD, asthma, multiple orthopedic surgeries    Examination-Activity Limitations  Locomotion Level;Sit    Examination-Participation Restrictions  Volunteer;Meal Prep    Stability/Clinical Decision Making  Evolving/Moderate complexity    Clinical Decision Making  Moderate    Rehab Potential  Good    PT Frequency  2x / week    PT Duration  6 weeks    PT Treatment/Interventions  ADLs/Self Care Home Management;Cryotherapy;Electrical Stimulation;Moist Heat;Ultrasound;Therapeutic exercise;Therapeutic activities;Functional mobility training;Iontophoresis 4mg /ml Dexamethasone;Neuromuscular re-education;Patient/family education;Manual techniques;Dry needling;Taping    PT Next Visit Plan  review HEP, assess response to DN, progress strengthening    PT Home Exercise Plan  Access Code: [email or web address]    Consulted and Agree with  Plan of Care  Patient       Patient will benefit from skilled therapeutic intervention in order to improve the following deficits and impairments:  Decreased range of motion, Increased fascial restricitons, Increased muscle spasms, Pain, Postural dysfunction, Decreased strength, Impaired flexibility  Visit Diagnosis: Acute right-sided low back pain without sciatica - Plan: PT plan of care cert/re-cert  Other symptoms and signs involving the musculoskeletal system - Plan: PT plan of care cert/re-cert  Muscle weakness (generalized) - Plan: PT plan of care cert/re-cert     Problem List Patient Active Problem List   Diagnosis Date Noted  . Dyslipidemia, goal LDL below 70 11/29/2018  . DJD (degenerative joint disease), lumbosacral 11/29/2018  . Pre-operative clearance 11/29/2018  . Nasal dryness 02/06/2018  . Arthralgia 02/05/2018  . Rhinorrhea 01/04/2018  . Excessive sweating 09/22/2017  . Tongue mass 08/14/2017  . Angina, class III ([place]) 07/07/2017  . CAD S/P percutaneous coronary angioplasty   .  Pneumothorax 06/25/2017  . Atypical chest pain 05/24/2017  . Itchy scalp 05/24/2017  . Right leg pain 02/06/2017  . Chronic tension-type headache, intractable 02/05/2017  . Prediabetes 02/05/2017  . Lump in throat 01/27/2017  . Dysphagia 01/27/2017  . No energy 09/15/2016  . Bad taste in mouth 07/26/2016  . Paresthesia 04/03/2016  . Facet hypertrophy of lumbosacral region 04/03/2016  . Chronic back pain 04/03/2016  . Right knee pain 02/19/2016  . Rosacea 02/04/2016  . Vaginal atrophy 01/31/2016  . Idiopathic scoliosis 01/22/2016  . Hip bursitis 12/25/2015  . Biceps tendonitis on right 12/25/2015  . Diverticulitis of colon 12/21/2015  . GERD (gastroesophageal reflux disease) 11/27/2015  . Osteoporosis 11/27/2015  . GAD (generalized anxiety disorder) 11/27/2015  . MDD (major depressive disorder), recurrent, in full remission ([place]) 11/27/2015  . Chronic dryness of both eyes 11/27/2015  . Hypothyroidism 11/27/2015  . Asthma, chronic 11/27/2015  . Insomnia 11/27/2015  . Seborrheic keratoses 11/27/2015      Laureen Abrahams, PT, DPT 01/04/19 12:14 PM     [facility] [place], [place], [address] Phone: [phone]   Fax:  [phone]  Name: Leslie Roberson MRN: [id number] Date of Birth: 1955-04-23

## 2019-01-04 NOTE — Patient Instructions (Signed)
Access Code: [email or web address]  URL: [email or web address]  Date: 01/04/2019  Prepared by: Faustino Congress   Exercises  Hooklying Single Knee to Chest - 3 reps - 1 sets - 30 sec hold - 2x daily - 7x weekly  Supine Double Knee to Chest - 3 reps - 1 sets - 30 sec hold - 2x daily - 7x weekly  Supine Piriformis Stretch with Foot on Ground - 3 reps - 1 sets - 30 sec hold - 2x daily - 7x weekly  Patient Education  Trigger Point Dry Needling

## 2019-01-06 ENCOUNTER — Ambulatory Visit (INDEPENDENT_AMBULATORY_CARE_PROVIDER_SITE_OTHER): Payer: BC Managed Care – PPO | Admitting: Physical Therapy

## 2019-01-06 ENCOUNTER — Other Ambulatory Visit: Payer: Self-pay

## 2019-01-06 ENCOUNTER — Encounter: Payer: Self-pay | Admitting: Physical Therapy

## 2019-01-06 DIAGNOSIS — R29898 Other symptoms and signs involving the musculoskeletal system: Secondary | ICD-10-CM | POA: Diagnosis not present

## 2019-01-06 DIAGNOSIS — M6281 Muscle weakness (generalized): Secondary | ICD-10-CM

## 2019-01-06 DIAGNOSIS — M545 Low back pain, unspecified: Secondary | ICD-10-CM

## 2019-01-06 NOTE — Therapy (Signed)
[place], [place], [address] Phone: [phone]   Fax:  [phone]  Physical Therapy Treatment  Patient Details  Name: Leslie Roberson MRN: [id number] Date of Birth: April 24, 1955 Referring Provider (PT): Zonia Kief, MD   Encounter Date: 01/06/2019  PT End of Session - 01/06/19 1229    Visit Number  2    Number of Visits  12    Date for PT Re-Evaluation  02/15/19    PT Start Time  6226    PT Stop Time  1224    PT Time Calculation (min)  39 min    Activity Tolerance  Patient tolerated treatment well    Behavior During Therapy  [facility] for tasks assessed/performed       Past Medical History:  Diagnosis Date  . Anxiety   . Arthritis    "right little finger; base of thumb left hand; spine" (07/07/2017)  . Asthma, chronic 11/27/2015  . Chronic back pain   . Coronary artery disease    4/19 PCI/DEx2 to LAD, and PCI/DES x1 to RCA, normal EF  . Depression   . Diverticulitis of colon 12/21/2015   Colonoscopy every 5 years/digestive health.   . GERD (gastroesophageal reflux disease) 11/27/2015  . History of blood transfusion    "related to spinal fusions"  . Hypothyroidism   . Idiopathic scoliosis 01/22/2016  . Osteoporosis 11/27/2015   On prolia.   . Pneumonia 2016  . Pneumothorax 06/26/2017  . Pre-diabetes   . Thyroid disease     Past Surgical History:  Procedure Laterality Date  . ABDOMINAL HYSTERECTOMY    . bladder mesh     S/P bladder sling"  . CORONARY STENT INTERVENTION N/A 07/07/2017   Procedure: CORONARY STENT INTERVENTION;  Surgeon: Leonie Man, MD;  Location: [place] [facility];  Service: Cardiovascular;  Laterality: N/A;  . ELBOW SURGERY Right    "dislocation"  . INCONTINENCE SURGERY     "didn't work"  . INTRAVASCULAR PRESSURE WIRE/FFR STUDY N/A 07/07/2017   Procedure: INTRAVASCULAR PRESSURE WIRE/FFR STUDY;  Surgeon: Leonie Man, MD;  Location: [place] [facility];   Service: Cardiovascular;  Laterality: N/A;  . JOINT REPLACEMENT    . LEFT HEART CATH AND CORONARY ANGIOGRAPHY N/A 07/07/2017   Procedure: LEFT HEART CATH AND CORONARY ANGIOGRAPHY;  Surgeon: Leonie Man, MD;  Location: [place] [facility];  Service: Cardiovascular;  Laterality: N/A;  . SPINAL FUSION  ~ 1970 X 3   "below neck - lower back"  . TONSILLECTOMY    . TOTAL HIP ARTHROPLASTY Left     There were no vitals filed for this visit.  Subjective Assessment - 01/06/19 1147    Subjective  was doing well until this morning, started to feel sore again; pain was down to a 1-2/10 and back up to 5-6/10    Limitations  Walking;Sitting    Patient Stated Goals  move comfortably, be able to lift Rt leg    Currently in Pain?  Yes    Pain Score  6     Pain Location  Back    Pain Orientation  Right    Pain Descriptors / Indicators  Aching;Cramping;Sharp    Pain Type  Chronic pain;Acute pain    Pain Onset  More than a month ago    Pain Frequency  Constant    Aggravating Factors   lifting leg up (putting on shoes, getting into car)    Pain Relieving Factors  "  sometimes I can't find anything," reports sometimes lidocaine works                       [facility] Adult PT Treatment/Exercise - 01/06/19 1149      Lumbar Exercises: Stretches   Figure 4 Stretch  4 reps;30 seconds;With overpressure;Seated    Figure 4 Stretch Limitations  2 reps prior to DN/manual; 2 reps following with improvement in symptoms      Lumbar Exercises: Aerobic   Nustep  L5 x 6 min      Manual Therapy   Manual Therapy  Soft tissue mobilization    Manual therapy comments  skilled monitoring and palpation of soft tissue during DN; pt Lt sidelying    Soft tissue mobilization  IASTM Rt glute med/min       Trigger Point Dry Needling - 01/06/19 1154    Consent Given?  Yes    Education Handout Provided  Previously provided    Muscles Treated Back/Hip  Gluteus minimus;Gluteus medius    Electrical Stimulation  Performed with Dry Needling  Yes    E-stim with Dry Needling Details  10 mHz freq to intensity tolerance x 5 in    Gluteus Minimus Response  Twitch response elicited;Palpable increased muscle length    Gluteus Medius Response  Twitch response elicited;Palpable increased muscle length                PT Long Term Goals - 01/04/19 1210      PT LONG TERM GOAL #1   Title  independent with HEP    Status  New    Target Date  02/15/19      PT LONG TERM GOAL #2   Title  improve FOTO score to </=51% limited for improved function    Status  New    Target Date  02/15/19      PT LONG TERM GOAL #3   Title  report pain < 4/10 with activity for improved function    Status  New    Target Date  02/15/19      PT LONG TERM GOAL #4   Title  demonstrate at least 4/5 bil hip abduction for improved function    Status  New    Target Date  02/15/19      PT LONG TERM GOAL #5   Title  n/a            Plan - 01/06/19 1229    Clinical Impression Statement  Pt reported reduction in pain after last session lasting until today, with pain back up to 5/10.  Pain reduced at end of session and pt reporting decreased tightness with stretches following manual therapy and DN.  No goals met as only 2nd visit.    Personal Factors and Comorbidities  Past/Current Experience;Comorbidity 3+    Comorbidities  osteoporosis, arthritis, scoliosis, anxiety, depression, CAD, asthma, multiple orthopedic surgeries    Examination-Activity Limitations  Locomotion Level;Sit    Examination-Participation Restrictions  Volunteer;Meal Prep    Stability/Clinical Decision Making  Evolving/Moderate complexity    Rehab Potential  Good    PT Frequency  2x / week    PT Duration  6 weeks    PT Treatment/Interventions  ADLs/Self Care Home Management;Cryotherapy;Electrical Stimulation;Moist Heat;Ultrasound;Therapeutic exercise;Therapeutic activities;Functional mobility training;Iontophoresis 37m/ml Dexamethasone;Neuromuscular  re-education;Patient/family education;Manual techniques;Dry needling;Taping    PT Next Visit Plan  review HEP, progress strengthening; manual/DN PRN    PT Home Exercise Plan  Access Code: [email or web address]  Consulted and Agree with Plan of Care  Patient       Patient will benefit from skilled therapeutic intervention in order to improve the following deficits and impairments:  Decreased range of motion, Increased fascial restricitons, Increased muscle spasms, Pain, Postural dysfunction, Decreased strength, Impaired flexibility  Visit Diagnosis: Acute right-sided low back pain without sciatica  Other symptoms and signs involving the musculoskeletal system  Muscle weakness (generalized)     Problem List Patient Active Problem List   Diagnosis Date Noted  . Dyslipidemia, goal LDL below 70 11/29/2018  . DJD (degenerative joint disease), lumbosacral 11/29/2018  . Pre-operative clearance 11/29/2018  . Nasal dryness 02/06/2018  . Arthralgia 02/05/2018  . Rhinorrhea 01/04/2018  . Excessive sweating 09/22/2017  . Tongue mass 08/14/2017  . Angina, class III ([place]) 07/07/2017  . CAD S/P percutaneous coronary angioplasty   . Pneumothorax 06/25/2017  . Atypical chest pain 05/24/2017  . Itchy scalp 05/24/2017  . Right leg pain 02/06/2017  . Chronic tension-type headache, intractable 02/05/2017  . Prediabetes 02/05/2017  . Lump in throat 01/27/2017  . Dysphagia 01/27/2017  . No energy 09/15/2016  . Bad taste in mouth 07/26/2016  . Paresthesia 04/03/2016  . Facet hypertrophy of lumbosacral region 04/03/2016  . Chronic back pain 04/03/2016  . Right knee pain 02/19/2016  . Rosacea 02/04/2016  . Vaginal atrophy 01/31/2016  . Idiopathic scoliosis 01/22/2016  . Hip bursitis 12/25/2015  . Biceps tendonitis on right 12/25/2015  . Diverticulitis of colon 12/21/2015  . GERD (gastroesophageal reflux disease) 11/27/2015  . Osteoporosis 11/27/2015  . GAD (generalized anxiety disorder) 11/27/2015   . MDD (major depressive disorder), recurrent, in full remission ([place]) 11/27/2015  . Chronic dryness of both eyes 11/27/2015  . Hypothyroidism 11/27/2015  . Asthma, chronic 11/27/2015  . Insomnia 11/27/2015  . Seborrheic keratoses 11/27/2015      Laureen Abrahams, PT, DPT 01/06/19 12:33 PM     [facility] [place], [place], [address] Phone: [phone]   Fax:  [phone]  Name: Royalti Schauf MRN: [id number] Date of Birth: 1955-07-05

## 2019-01-17 ENCOUNTER — Encounter: Payer: Self-pay | Admitting: Physical Therapy

## 2019-01-17 ENCOUNTER — Other Ambulatory Visit: Payer: Self-pay

## 2019-01-17 ENCOUNTER — Ambulatory Visit (INDEPENDENT_AMBULATORY_CARE_PROVIDER_SITE_OTHER): Payer: BC Managed Care – PPO | Admitting: Physical Therapy

## 2019-01-17 DIAGNOSIS — M545 Low back pain, unspecified: Secondary | ICD-10-CM

## 2019-01-17 DIAGNOSIS — M6281 Muscle weakness (generalized): Secondary | ICD-10-CM

## 2019-01-17 DIAGNOSIS — R29898 Other symptoms and signs involving the musculoskeletal system: Secondary | ICD-10-CM | POA: Diagnosis not present

## 2019-01-17 NOTE — Patient Instructions (Signed)
Access Code: [email or web address]  URL: [email or web address]  Date: 01/17/2019  Prepared by: Faustino Congress   Exercises  Hooklying Single Knee to Chest - 3 reps - 1 sets - 30 sec hold - 2x daily - 7x weekly  Supine Double Knee to Chest - 3 reps - 1 sets - 30 sec hold - 2x daily - 7x weekly  Supine Piriformis Stretch with Foot on Ground - 3 reps - 1 sets - 30 sec hold - 2x daily - 7x weekly  Supine Posterior Pelvic Tilt - 10 reps - 1 sets - 5 sec hold - 2x daily - 7x weekly  Bent Knee Fallouts - 10 reps - 1-2 sets - 2x daily - 7x weekly  Supine Bridge - 10 reps - 1 sets - 5 sec hold - 2x daily - 7x weekly  Clamshell - 10 reps - 3 sets - 1x daily - 7x weekly  Patient Education  Trigger Point Dry Needling

## 2019-01-17 NOTE — Therapy (Signed)
[place], [place], [address] Phone: [phone]   Fax:  [phone]  Physical Therapy Treatment  Patient Details  Name: Leslie Roberson MRN: [id number] Date of Birth: 08-27-1955 Referring Provider (PT): Zonia Kief, MD   Encounter Date: 01/17/2019  PT End of Session - 01/17/19 1511    Visit Number  3    Number of Visits  12    Date for PT Re-Evaluation  02/15/19    PT Start Time  1430    PT Stop Time  1521    PT Time Calculation (min)  51 min    Activity Tolerance  Patient tolerated treatment well    Behavior During Therapy  [facility] for tasks assessed/performed       Past Medical History:  Diagnosis Date  . Anxiety   . Arthritis    "right little finger; base of thumb left hand; spine" (07/07/2017)  . Asthma, chronic 11/27/2015  . Chronic back pain   . Coronary artery disease    4/19 PCI/DEx2 to LAD, and PCI/DES x1 to RCA, normal EF  . Depression   . Diverticulitis of colon 12/21/2015   Colonoscopy every 5 years/digestive health.   . GERD (gastroesophageal reflux disease) 11/27/2015  . History of blood transfusion    "related to spinal fusions"  . Hypothyroidism   . Idiopathic scoliosis 01/22/2016  . Osteoporosis 11/27/2015   On prolia.   . Pneumonia 2016  . Pneumothorax 06/26/2017  . Pre-diabetes   . Thyroid disease     Past Surgical History:  Procedure Laterality Date  . ABDOMINAL HYSTERECTOMY    . bladder mesh     S/P bladder sling"  . CORONARY STENT INTERVENTION N/A 07/07/2017   Procedure: CORONARY STENT INTERVENTION;  Surgeon: Leonie Man, MD;  Location: [place] [facility];  Service: Cardiovascular;  Laterality: N/A;  . ELBOW SURGERY Right    "dislocation"  . INCONTINENCE SURGERY     "didn't work"  . INTRAVASCULAR PRESSURE WIRE/FFR STUDY N/A 07/07/2017   Procedure: INTRAVASCULAR PRESSURE WIRE/FFR STUDY;  Surgeon: Leonie Man, MD;  Location: [place] [facility];   Service: Cardiovascular;  Laterality: N/A;  . JOINT REPLACEMENT    . LEFT HEART CATH AND CORONARY ANGIOGRAPHY N/A 07/07/2017   Procedure: LEFT HEART CATH AND CORONARY ANGIOGRAPHY;  Surgeon: Leonie Man, MD;  Location: [place] [facility];  Service: Cardiovascular;  Laterality: N/A;  . SPINAL FUSION  ~ 1970 X 3   "below neck - lower back"  . TONSILLECTOMY    . TOTAL HIP ARTHROPLASTY Left     There were no vitals filed for this visit.  Subjective Assessment - 01/17/19 1427    Subjective  "I was so sore after the last therapy session. I'm okay now."  lidocaine patch was helpful    Limitations  Walking;Sitting    Patient Stated Goals  move comfortably, be able to lift Rt leg    Currently in Pain?  Yes    Pain Score  5     Pain Location  Back    Pain Orientation  Right    Pain Descriptors / Indicators  Aching;Cramping;Sharp    Pain Type  Chronic pain;Acute pain    Pain Onset  More than a month ago    Pain Frequency  Constant    Aggravating Factors   lifting leg up (putting on shoes, getting into car)    Pain Relieving Factors  "sometimes I can't find anything",  lidocaine                       [facility] Adult PT Treatment/Exercise - 01/17/19 1434      Lumbar Exercises: Stretches   Passive Hamstring Stretch  Right;3 reps;30 seconds    Single Knee to Chest Stretch  3 reps;30 seconds;Right;Left    Figure 4 Stretch  3 reps;30 seconds;Seated;With overpressure      Lumbar Exercises: Aerobic   Nustep  L5 x 6 min      Lumbar Exercises: Supine   Pelvic Tilt  10 reps;5 seconds    Clam  10 reps    Clam Limitations  with core engaged      Modalities   Modalities  Electrical Stimulation;Moist Heat      Moist Heat Therapy   Number Minutes Moist Heat  12 Minutes    Moist Heat Location  Lumbar Spine      Electrical Stimulation   Electrical Stimulation Location  Rt low back    Electrical Stimulation Action  premod    Electrical Stimulation Parameters  to tolerance     Electrical Stimulation Goals  Pain      Manual Therapy   Manual Therapy  Soft tissue mobilization    Soft tissue mobilization  Rt glute med and QL             PT Education - 01/17/19 1511    Education Details  strengthening HEP    Person(s) Educated  Patient    Methods  Explanation;Demonstration;Handout    Comprehension  Verbalized understanding;Returned demonstration;Need further instruction          PT Long Term Goals - 01/04/19 1210      PT LONG TERM GOAL #1   Title  independent with HEP    Status  New    Target Date  02/15/19      PT LONG TERM GOAL #2   Title  improve FOTO score to </=51% limited for improved function    Status  New    Target Date  02/15/19      PT LONG TERM GOAL #3   Title  report pain < 4/10 with activity for improved function    Status  New    Target Date  02/15/19      PT LONG TERM GOAL #4   Title  demonstrate at least 4/5 bil hip abduction for improved function    Status  New    Target Date  02/15/19      PT LONG TERM GOAL #5   Title  n/a            Plan - 01/17/19 1511    Clinical Impression Statement  Pt very sore after last session, but able to tolerate drive to [place].  Overall minimal progress, but only 3rd visit and pt hasn't been to PT in over a week.  Progressing slowly with PT.    Personal Factors and Comorbidities  Past/Current Experience;Comorbidity 3+    Comorbidities  osteoporosis, arthritis, scoliosis, anxiety, depression, CAD, asthma, multiple orthopedic surgeries    Examination-Activity Limitations  Locomotion Level;Sit    Examination-Participation Restrictions  Volunteer;Meal Prep    Stability/Clinical Decision Making  Evolving/Moderate complexity    Rehab Potential  Good    PT Frequency  2x / week    PT Duration  6 weeks    PT Treatment/Interventions  ADLs/Self Care Home Management;Cryotherapy;Electrical Stimulation;Moist Heat;Ultrasound;Therapeutic exercise;Therapeutic activities;Functional mobility  training;Iontophoresis 4mg /ml Dexamethasone;Neuromuscular re-education;Patient/family education;Manual techniques;Dry needling;Taping  PT Next Visit Plan  review updated HEP; manual/DN PRN    PT Home Exercise Plan  Access Code: [email or web address]    Consulted and Agree with Plan of Care  Patient       Patient will benefit from skilled therapeutic intervention in order to improve the following deficits and impairments:  Decreased range of motion, Increased fascial restricitons, Increased muscle spasms, Pain, Postural dysfunction, Decreased strength, Impaired flexibility  Visit Diagnosis: Acute right-sided low back pain without sciatica  Other symptoms and signs involving the musculoskeletal system  Muscle weakness (generalized)     Problem List Patient Active Problem List   Diagnosis Date Noted  . Dyslipidemia, goal LDL below 70 11/29/2018  . DJD (degenerative joint disease), lumbosacral 11/29/2018  . Pre-operative clearance 11/29/2018  . Nasal dryness 02/06/2018  . Arthralgia 02/05/2018  . Rhinorrhea 01/04/2018  . Excessive sweating 09/22/2017  . Tongue mass 08/14/2017  . Angina, class III ([place]) 07/07/2017  . CAD S/P percutaneous coronary angioplasty   . Pneumothorax 06/25/2017  . Atypical chest pain 05/24/2017  . Itchy scalp 05/24/2017  . Right leg pain 02/06/2017  . Chronic tension-type headache, intractable 02/05/2017  . Prediabetes 02/05/2017  . Lump in throat 01/27/2017  . Dysphagia 01/27/2017  . No energy 09/15/2016  . Bad taste in mouth 07/26/2016  . Paresthesia 04/03/2016  . Facet hypertrophy of lumbosacral region 04/03/2016  . Chronic back pain 04/03/2016  . Right knee pain 02/19/2016  . Rosacea 02/04/2016  . Vaginal atrophy 01/31/2016  . Idiopathic scoliosis 01/22/2016  . Hip bursitis 12/25/2015  . Biceps tendonitis on right 12/25/2015  . Diverticulitis of colon 12/21/2015  . GERD (gastroesophageal reflux disease) 11/27/2015  . Osteoporosis 11/27/2015  . GAD  (generalized anxiety disorder) 11/27/2015  . MDD (major depressive disorder), recurrent, in full remission ([place]) 11/27/2015  . Chronic dryness of both eyes 11/27/2015  . Hypothyroidism 11/27/2015  . Asthma, chronic 11/27/2015  . Insomnia 11/27/2015  . Seborrheic keratoses 11/27/2015      Laureen Abrahams, PT, DPT 01/17/19 3:13 PM    [facility] [place], [place], [address] Phone: [phone]   Fax:  [phone]  Name: Leslie Roberson MRN: [id number] Date of Birth: 1955-10-08

## 2019-01-18 DIAGNOSIS — F333 Major depressive disorder, recurrent, severe with psychotic symptoms: Secondary | ICD-10-CM | POA: Diagnosis not present

## 2019-01-20 ENCOUNTER — Other Ambulatory Visit: Payer: Self-pay

## 2019-01-20 ENCOUNTER — Encounter: Payer: Self-pay | Admitting: Physical Therapy

## 2019-01-20 ENCOUNTER — Ambulatory Visit (INDEPENDENT_AMBULATORY_CARE_PROVIDER_SITE_OTHER): Payer: BC Managed Care – PPO | Admitting: Physical Therapy

## 2019-01-20 DIAGNOSIS — M545 Low back pain, unspecified: Secondary | ICD-10-CM

## 2019-01-20 DIAGNOSIS — M6281 Muscle weakness (generalized): Secondary | ICD-10-CM

## 2019-01-20 DIAGNOSIS — R29898 Other symptoms and signs involving the musculoskeletal system: Secondary | ICD-10-CM | POA: Diagnosis not present

## 2019-01-20 NOTE — Therapy (Signed)
[place], [place], [address] Phone: [phone]   Fax:  [phone]  Physical Therapy Treatment  Patient Details  Name: Leslie Roberson MRN: [id number] Date of Birth: 04-21-1955 Referring Provider (PT): Zonia Kief, MD   Encounter Date: 01/20/2019  PT End of Session - 01/20/19 1524    Visit Number  4    Number of Visits  12    Date for PT Re-Evaluation  02/15/19    PT Start Time  [id number]    PT Stop Time  1524    PT Time Calculation (min)  38 min    Activity Tolerance  Patient tolerated treatment well    Behavior During Therapy  [facility] for tasks assessed/performed       Past Medical History:  Diagnosis Date  . Anxiety   . Arthritis    "right little finger; base of thumb left hand; spine" (07/07/2017)  . Asthma, chronic 11/27/2015  . Chronic back pain   . Coronary artery disease    4/19 PCI/DEx2 to LAD, and PCI/DES x1 to RCA, normal EF  . Depression   . Diverticulitis of colon 12/21/2015   Colonoscopy every 5 years/digestive health.   . GERD (gastroesophageal reflux disease) 11/27/2015  . History of blood transfusion    "related to spinal fusions"  . Hypothyroidism   . Idiopathic scoliosis 01/22/2016  . Osteoporosis 11/27/2015   On prolia.   . Pneumonia 2016  . Pneumothorax 06/26/2017  . Pre-diabetes   . Thyroid disease     Past Surgical History:  Procedure Laterality Date  . ABDOMINAL HYSTERECTOMY    . bladder mesh     S/P bladder sling"  . CORONARY STENT INTERVENTION N/A 07/07/2017   Procedure: CORONARY STENT INTERVENTION;  Surgeon: Leonie Man, MD;  Location: [place] [facility];  Service: Cardiovascular;  Laterality: N/A;  . ELBOW SURGERY Right    "dislocation"  . INCONTINENCE SURGERY     "didn't work"  . INTRAVASCULAR PRESSURE WIRE/FFR STUDY N/A 07/07/2017   Procedure: INTRAVASCULAR PRESSURE WIRE/FFR STUDY;  Surgeon: Leonie Man, MD;  Location: [place] [facility];   Service: Cardiovascular;  Laterality: N/A;  . JOINT REPLACEMENT    . LEFT HEART CATH AND CORONARY ANGIOGRAPHY N/A 07/07/2017   Procedure: LEFT HEART CATH AND CORONARY ANGIOGRAPHY;  Surgeon: Leonie Man, MD;  Location: [place] [facility];  Service: Cardiovascular;  Laterality: N/A;  . SPINAL FUSION  ~ 1970 X 3   "below neck - lower back"  . TONSILLECTOMY    . TOTAL HIP ARTHROPLASTY Left     There were no vitals filed for this visit.  Subjective Assessment - 01/20/19 1450    Subjective  feels "about the same." tennis ball gets painful.    Limitations  Walking;Sitting    Patient Stated Goals  move comfortably, be able to lift Rt leg    Currently in Pain?  Yes    Pain Score  5     Pain Location  Back    Pain Orientation  Right    Pain Descriptors / Indicators  Aching;Cramping;Sharp    Pain Type  Chronic pain;Acute pain    Pain Onset  More than a month ago    Pain Frequency  Constant    Aggravating Factors   lifting leg up (putting on shoes, getting into car)    Pain Relieving Factors  lidocaine  [facility] Adult PT Treatment/Exercise - 01/20/19 1453      Lumbar Exercises: Aerobic   Nustep  L5 x 6 min      Lumbar Exercises: Standing   Row  Both;15 reps;Theraband    Theraband Level (Row)  Level 3 (Green)    Row Limitations  5 sec hold      Lumbar Exercises: Supine   Pelvic Tilt  10 reps;5 seconds    Bridge  10 reps;5 seconds    Other Supine Lumbar Exercises  mini crunch (hands on thighs to knees) x 10 reps      Lumbar Exercises: Sidelying   Clam  Both;10 reps      Manual Therapy   Manual Therapy  Soft tissue mobilization    Soft tissue mobilization  Rt glute med and QL       Trigger Point Dry Needling - 01/20/19 1523    Consent Given?  Yes    Education Handout Provided  Previously provided    Muscles Treated Back/Hip  Gluteus minimus;Gluteus medius    Gluteus Minimus Response  Twitch response elicited;Palpable increased muscle  length    Gluteus Medius Response  Twitch response elicited;Palpable increased muscle length                PT Long Term Goals - 01/04/19 1210      PT LONG TERM GOAL #1   Title  independent with HEP    Status  New    Target Date  02/15/19      PT LONG TERM GOAL #2   Title  improve FOTO score to </=51% limited for improved function    Status  New    Target Date  02/15/19      PT LONG TERM GOAL #3   Title  report pain < 4/10 with activity for improved function    Status  New    Target Date  02/15/19      PT LONG TERM GOAL #4   Title  demonstrate at least 4/5 bil hip abduction for improved function    Status  New    Target Date  02/15/19      PT LONG TERM GOAL #5   Title  n/a            Plan - 01/20/19 1524    Clinical Impression Statement  Pt demonstrates minimal improvement at this time, but feels DN is beneficial (without estim).  Tolerated exercise progression well today.    Personal Factors and Comorbidities  Past/Current Experience;Comorbidity 3+    Comorbidities  osteoporosis, arthritis, scoliosis, anxiety, depression, CAD, asthma, multiple orthopedic surgeries    Examination-Activity Limitations  Locomotion Level;Sit    Examination-Participation Restrictions  Volunteer;Meal Prep    Stability/Clinical Decision Making  Evolving/Moderate complexity    Rehab Potential  Good    PT Frequency  2x / week    PT Duration  6 weeks    PT Treatment/Interventions  ADLs/Self Care Home Management;Cryotherapy;Electrical Stimulation;Moist Heat;Ultrasound;Therapeutic exercise;Therapeutic activities;Functional mobility training;Iontophoresis 4mg /ml Dexamethasone;Neuromuscular re-education;Patient/family education;Manual techniques;Dry needling;Taping    PT Next Visit Plan  review updated HEP; manual/DN PRN    PT Home Exercise Plan  Access Code: [email or web address]    Consulted and Agree with Plan of Care  Patient       Patient will benefit from skilled therapeutic intervention in  order to improve the following deficits and impairments:  Decreased range of motion, Increased fascial restricitons, Increased muscle spasms, Pain, Postural dysfunction, Decreased strength, Impaired flexibility  Visit Diagnosis:  Acute right-sided low back pain without sciatica  Other symptoms and signs involving the musculoskeletal system  Muscle weakness (generalized)     Problem List Patient Active Problem List   Diagnosis Date Noted  . Dyslipidemia, goal LDL below 70 11/29/2018  . DJD (degenerative joint disease), lumbosacral 11/29/2018  . Pre-operative clearance 11/29/2018  . Nasal dryness 02/06/2018  . Arthralgia 02/05/2018  . Rhinorrhea 01/04/2018  . Excessive sweating 09/22/2017  . Tongue mass 08/14/2017  . Angina, class III ([place]) 07/07/2017  . CAD S/P percutaneous coronary angioplasty   . Pneumothorax 06/25/2017  . Atypical chest pain 05/24/2017  . Itchy scalp 05/24/2017  . Right leg pain 02/06/2017  . Chronic tension-type headache, intractable 02/05/2017  . Prediabetes 02/05/2017  . Lump in throat 01/27/2017  . Dysphagia 01/27/2017  . No energy 09/15/2016  . Bad taste in mouth 07/26/2016  . Paresthesia 04/03/2016  . Facet hypertrophy of lumbosacral region 04/03/2016  . Chronic back pain 04/03/2016  . Right knee pain 02/19/2016  . Rosacea 02/04/2016  . Vaginal atrophy 01/31/2016  . Idiopathic scoliosis 01/22/2016  . Hip bursitis 12/25/2015  . Biceps tendonitis on right 12/25/2015  . Diverticulitis of colon 12/21/2015  . GERD (gastroesophageal reflux disease) 11/27/2015  . Osteoporosis 11/27/2015  . GAD (generalized anxiety disorder) 11/27/2015  . MDD (major depressive disorder), recurrent, in full remission ([place]) 11/27/2015  . Chronic dryness of both eyes 11/27/2015  . Hypothyroidism 11/27/2015  . Asthma, chronic 11/27/2015  . Insomnia 11/27/2015  . Seborrheic keratoses 11/27/2015      Laureen Abrahams, PT, DPT 01/20/19 3:27 PM     [facility] [place], [place], [address] Phone: [phone]   Fax:  [phone]  Name: Leeloo Longie MRN: [id number] Date of Birth: [birth year]

## 2019-01-24 ENCOUNTER — Other Ambulatory Visit: Payer: Self-pay

## 2019-01-24 ENCOUNTER — Encounter: Payer: Self-pay | Admitting: Physical Therapy

## 2019-01-24 ENCOUNTER — Ambulatory Visit (INDEPENDENT_AMBULATORY_CARE_PROVIDER_SITE_OTHER): Payer: BC Managed Care – PPO | Admitting: Physical Therapy

## 2019-01-24 DIAGNOSIS — M6281 Muscle weakness (generalized): Secondary | ICD-10-CM

## 2019-01-24 DIAGNOSIS — R29898 Other symptoms and signs involving the musculoskeletal system: Secondary | ICD-10-CM

## 2019-01-24 DIAGNOSIS — M545 Low back pain, unspecified: Secondary | ICD-10-CM

## 2019-01-24 NOTE — Therapy (Signed)
[place], [place], [address] Phone: [phone]   Fax:  [phone]  Physical Therapy Treatment  Patient Details  Name: Leslie Roberson MRN: [id number] Date of Birth: 03-22-1956 Referring Provider (PT): Zonia Kief, MD   Encounter Date: 01/24/2019  PT End of Session - 01/24/19 1354    Visit Number  5    Number of Visits  12    Date for PT Re-Evaluation  02/15/19    PT Start Time  [id number]    PT Stop Time  1442    PT Time Calculation (min)  50 min    Activity Tolerance  Patient tolerated treatment well    Behavior During Therapy  [facility] for tasks assessed/performed       Past Medical History:  Diagnosis Date  . Anxiety   . Arthritis    "right little finger; base of thumb left hand; spine" (07/07/2017)  . Asthma, chronic 11/27/2015  . Chronic back pain   . Coronary artery disease    4/19 PCI/DEx2 to LAD, and PCI/DES x1 to RCA, normal EF  . Depression   . Diverticulitis of colon 12/21/2015   Colonoscopy every 5 years/digestive health.   . GERD (gastroesophageal reflux disease) 11/27/2015  . History of blood transfusion    "related to spinal fusions"  . Hypothyroidism   . Idiopathic scoliosis 01/22/2016  . Osteoporosis 11/27/2015   On prolia.   . Pneumonia 2016  . Pneumothorax 06/26/2017  . Pre-diabetes   . Thyroid disease     Past Surgical History:  Procedure Laterality Date  . ABDOMINAL HYSTERECTOMY    . bladder mesh     S/P bladder sling"  . CORONARY STENT INTERVENTION N/A 07/07/2017   Procedure: CORONARY STENT INTERVENTION;  Surgeon: Leonie Man, MD;  Location: [place] [facility];  Service: Cardiovascular;  Laterality: N/A;  . ELBOW SURGERY Right    "dislocation"  . INCONTINENCE SURGERY     "didn't work"  . INTRAVASCULAR PRESSURE WIRE/FFR STUDY N/A 07/07/2017   Procedure: INTRAVASCULAR PRESSURE WIRE/FFR STUDY;  Surgeon: Leonie Man, MD;  Location: [place] [facility];   Service: Cardiovascular;  Laterality: N/A;  . JOINT REPLACEMENT    . LEFT HEART CATH AND CORONARY ANGIOGRAPHY N/A 07/07/2017   Procedure: LEFT HEART CATH AND CORONARY ANGIOGRAPHY;  Surgeon: Leonie Man, MD;  Location: [place] [facility];  Service: Cardiovascular;  Laterality: N/A;  . SPINAL FUSION  ~ 1970 X 3   "below neck - lower back"  . TONSILLECTOMY    . TOTAL HIP ARTHROPLASTY Left     There were no vitals filed for this visit.  Subjective Assessment - 01/24/19 1354    Subjective  Patient reports continued pain in Right low back.    Patient Stated Goals  move comfortably, be able to lift Rt leg    Currently in Pain?  Yes    Pain Score  7     Pain Location  Back    Pain Orientation  Right    Pain Descriptors / Indicators  Aching                       [facility] Adult PT Treatment/Exercise - 01/24/19 0001      Lumbar Exercises: Stretches   Other Lumbar Stretch Exercise  supine reverse C stretch 2x30 sec      Lumbar Exercises: Aerobic   Nustep  L5 x 4 min  Lumbar Exercises: Standing   Row  Both;15 reps;Theraband    Theraband Level (Row)  Level 3 (Green)      Lumbar Exercises: Supine   Pelvic Tilt  10 reps;5 seconds    Clam  10 reps    Clam Limitations  with core engaged    Bridge  5 seconds;20 reps      Modalities   Modalities  Electrical Stimulation;Moist Heat      Moist Heat Therapy   Number Minutes Moist Heat  12 Minutes    Moist Heat Location  Lumbar Spine      Electrical Stimulation   Electrical Stimulation Location  Rt low back    Electrical Stimulation Action  premod    Electrical Stimulation Parameters  to tolerance    Electrical Stimulation Goals  Pain      Manual Therapy   Manual Therapy  Soft tissue mobilization    Soft tissue mobilization  Rt gluteals and lumbar paraspinals        Trigger Point Dry Needling - 01/24/19 0001    Consent Given?  Yes    Education Handout Provided  Previously provided    Muscles Treated Back/Hip   Gluteus medius;Lumbar multifidi    Dry Needling Comments  right    Gluteus Minimus Response  Twitch response elicited;Palpable increased muscle length    Gluteus Medius Response  Twitch response elicited;Palpable increased muscle length    Lumbar multifidi Response  Twitch response elicited;Palpable increased muscle length                PT Long Term Goals - 01/04/19 1210      PT LONG TERM GOAL #1   Title  independent with HEP    Status  New    Target Date  02/15/19      PT LONG TERM GOAL #2   Title  improve FOTO score to </=51% limited for improved function    Status  New    Target Date  02/15/19      PT LONG TERM GOAL #3   Title  report pain < 4/10 with activity for improved function    Status  New    Target Date  02/15/19      PT LONG TERM GOAL #4   Title  demonstrate at least 4/5 bil hip abduction for improved function    Status  New    Target Date  02/15/19      PT LONG TERM GOAL #5   Title  n/a            Plan - 01/24/19 1636    Clinical Impression Statement  Patient responded well to DN today and stretches to left lumbar. She will benefit from further stretching to left side.    PT Treatment/Interventions  ADLs/Self Care Home Management;Cryotherapy;Electrical Stimulation;Moist Heat;Ultrasound;Therapeutic exercise;Therapeutic activities;Functional mobility training;Iontophoresis 4mg /ml Dexamethasone;Neuromuscular re-education;Patient/family education;Manual techniques;Dry needling;Taping    PT Next Visit Plan  continue left lumbar stretching, strengthening  manual/DN PRN    PT Home Exercise Plan  Access Code: [email or web address]    Consulted and Agree with Plan of Care  Patient       Patient will benefit from skilled therapeutic intervention in order to improve the following deficits and impairments:  Decreased range of motion, Increased fascial restricitons, Increased muscle spasms, Pain, Postural dysfunction, Decreased strength, Impaired flexibility  Visit  Diagnosis: Acute right-sided low back pain without sciatica  Other symptoms and signs involving the musculoskeletal system  Muscle weakness (generalized)  Problem List Patient Active Problem List   Diagnosis Date Noted  . Dyslipidemia, goal LDL below 70 11/29/2018  . DJD (degenerative joint disease), lumbosacral 11/29/2018  . Pre-operative clearance 11/29/2018  . Nasal dryness 02/06/2018  . Arthralgia 02/05/2018  . Rhinorrhea 01/04/2018  . Excessive sweating 09/22/2017  . Tongue mass 08/14/2017  . Angina, class III ([place]) 07/07/2017  . CAD S/P percutaneous coronary angioplasty   . Pneumothorax 06/25/2017  . Atypical chest pain 05/24/2017  . Itchy scalp 05/24/2017  . Right leg pain 02/06/2017  . Chronic tension-type headache, intractable 02/05/2017  . Prediabetes 02/05/2017  . Lump in throat 01/27/2017  . Dysphagia 01/27/2017  . No energy 09/15/2016  . Bad taste in mouth 07/26/2016  . Paresthesia 04/03/2016  . Facet hypertrophy of lumbosacral region 04/03/2016  . Chronic back pain 04/03/2016  . Right knee pain 02/19/2016  . Rosacea 02/04/2016  . Vaginal atrophy 01/31/2016  . Idiopathic scoliosis 01/22/2016  . Hip bursitis 12/25/2015  . Biceps tendonitis on right 12/25/2015  . Diverticulitis of colon 12/21/2015  . GERD (gastroesophageal reflux disease) 11/27/2015  . Osteoporosis 11/27/2015  . GAD (generalized anxiety disorder) 11/27/2015  . MDD (major depressive disorder), recurrent, in full remission ([place]) 11/27/2015  . Chronic dryness of both eyes 11/27/2015  . Hypothyroidism 11/27/2015  . Asthma, chronic 11/27/2015  . Insomnia 11/27/2015  . Seborrheic keratoses 11/27/2015    Madelyn Flavors PT 01/24/2019, 4:39 PM  [facility] [place], [place], [address] Phone: [phone]   Fax:  [phone]  Name: Leslie Roberson MRN: [id number] Date of Birth: 05/15/1955

## 2019-01-27 ENCOUNTER — Encounter: Payer: BC Managed Care – PPO | Admitting: Physical Therapy

## 2019-01-28 ENCOUNTER — Encounter: Payer: Self-pay | Admitting: Physician Assistant

## 2019-01-28 DIAGNOSIS — Z0000 Encounter for general adult medical examination without abnormal findings: Secondary | ICD-10-CM

## 2019-01-28 DIAGNOSIS — Z13220 Encounter for screening for lipoid disorders: Secondary | ICD-10-CM

## 2019-01-28 DIAGNOSIS — R7303 Prediabetes: Secondary | ICD-10-CM

## 2019-01-28 DIAGNOSIS — M810 Age-related osteoporosis without current pathological fracture: Secondary | ICD-10-CM

## 2019-01-28 DIAGNOSIS — E039 Hypothyroidism, unspecified: Secondary | ICD-10-CM

## 2019-01-28 NOTE — Telephone Encounter (Signed)
Labs pended, please review and advise.

## 2019-01-31 ENCOUNTER — Other Ambulatory Visit: Payer: Self-pay

## 2019-01-31 ENCOUNTER — Other Ambulatory Visit: Payer: Self-pay | Admitting: Physician Assistant

## 2019-01-31 ENCOUNTER — Encounter: Payer: Self-pay | Admitting: Physical Therapy

## 2019-01-31 ENCOUNTER — Ambulatory Visit (INDEPENDENT_AMBULATORY_CARE_PROVIDER_SITE_OTHER): Payer: BC Managed Care – PPO | Admitting: Physical Therapy

## 2019-01-31 DIAGNOSIS — M545 Low back pain, unspecified: Secondary | ICD-10-CM

## 2019-01-31 DIAGNOSIS — Z1231 Encounter for screening mammogram for malignant neoplasm of breast: Secondary | ICD-10-CM

## 2019-01-31 DIAGNOSIS — F333 Major depressive disorder, recurrent, severe with psychotic symptoms: Secondary | ICD-10-CM | POA: Diagnosis not present

## 2019-01-31 DIAGNOSIS — R29898 Other symptoms and signs involving the musculoskeletal system: Secondary | ICD-10-CM | POA: Diagnosis not present

## 2019-01-31 DIAGNOSIS — M6281 Muscle weakness (generalized): Secondary | ICD-10-CM

## 2019-01-31 NOTE — Therapy (Signed)
[place], [place], [address] Phone: [phone]   Fax:  [phone]  Physical Therapy Treatment  Patient Details  Name: Leslie Roberson MRN: [id number] Date of Birth: [birth year] Referring Provider (PT): Zonia Kief, MD   Encounter Date: 01/31/2019  PT End of Session - 01/31/19 1433    Visit Number  6    Number of Visits  12    Date for PT Re-Evaluation  02/15/19    PT Start Time  [id number]    PT Stop Time  1516    PT Time Calculation (min)  43 min    Activity Tolerance  Patient tolerated treatment well    Behavior During Therapy  [facility] for tasks assessed/performed       Past Medical History:  Diagnosis Date  . Anxiety   . Arthritis    "right little finger; base of thumb left hand; spine" (07/07/2017)  . Asthma, chronic 11/27/2015  . Chronic back pain   . Coronary artery disease    4/19 PCI/DEx2 to LAD, and PCI/DES x1 to RCA, normal EF  . Depression   . Diverticulitis of colon 12/21/2015   Colonoscopy every 5 years/digestive health.   . GERD (gastroesophageal reflux disease) 11/27/2015  . History of blood transfusion    "related to spinal fusions"  . Hypothyroidism   . Idiopathic scoliosis 01/22/2016  . Osteoporosis 11/27/2015   On prolia.   . Pneumonia 2016  . Pneumothorax 06/26/2017  . Pre-diabetes   . Thyroid disease     Past Surgical History:  Procedure Laterality Date  . ABDOMINAL HYSTERECTOMY    . bladder mesh     S/P bladder sling"  . CORONARY STENT INTERVENTION N/A 07/07/2017   Procedure: CORONARY STENT INTERVENTION;  Surgeon: Leonie Man, MD;  Location: [place] [facility];  Service: Cardiovascular;  Laterality: N/A;  . ELBOW SURGERY Right    "dislocation"  . INCONTINENCE SURGERY     "didn't work"  . INTRAVASCULAR PRESSURE WIRE/FFR STUDY N/A 07/07/2017   Procedure: INTRAVASCULAR PRESSURE WIRE/FFR STUDY;  Surgeon: Leonie Man, MD;  Location: [place] [facility];   Service: Cardiovascular;  Laterality: N/A;  . JOINT REPLACEMENT    . LEFT HEART CATH AND CORONARY ANGIOGRAPHY N/A 07/07/2017   Procedure: LEFT HEART CATH AND CORONARY ANGIOGRAPHY;  Surgeon: Leonie Man, MD;  Location: [place] [facility];  Service: Cardiovascular;  Laterality: N/A;  . SPINAL FUSION  ~ 1970 X 3   "below neck - lower back"  . TONSILLECTOMY    . TOTAL HIP ARTHROPLASTY Left     There were no vitals filed for this visit.  Subjective Assessment - 01/31/19 1434    Subjective  Patient presents with increased pain starting right after she got up today. She did well last week after DN. She can't recall doing anything to set it off.    Patient Stated Goals  move comfortably, be able to lift Rt leg    Currently in Pain?  Yes    Pain Score  8     Pain Location  Back    Pain Orientation  Right    Pain Descriptors / Indicators  Aching    Pain Type  Chronic pain         [facility] PT Assessment - 01/31/19 0001      Strength   Right Hip ABduction  5/5    Left Hip ABduction  5/5  [place] Adult PT Treatment/Exercise - 01/31/19 0001      Lumbar Exercises: Stretches   Quadruped Mid Back Stretch  20 seconds    Quadruped Mid Back Stretch Limitations  and bil x 20 sec ea      Lumbar Exercises: Aerobic   Nustep  L1 x 78min      Lumbar Exercises: Standing   Other Standing Lumbar Exercises  lumbar ext x 10      Lumbar Exercises: Seated   Other Seated Lumbar Exercises  lumbar flex/ext x 5      Lumbar Exercises: Supine   Pelvic Tilt  10 reps      Lumbar Exercises: Quadruped   [place]  10 reps      Manual Therapy   Manual Therapy  Soft tissue mobilization    Soft tissue mobilization  Rt gluteals and bil lumbar paraspinals       Trigger Point Dry Needling - 01/31/19 0001    Consent Given?  Yes    Education Handout Provided  Previously provided    Muscles Treated Back/Hip  Gluteus medius    Dry Needling Comments  right near PSIS; 2  needles    Gluteus Medius Response  Palpable increased muscle length           PT Education - 01/31/19 1634    Education Details  standing extension with self blocking at L5/S1 using thumb or tennis ball    Person(s) Educated  Patient    Methods  Explanation;Demonstration    Comprehension  Verbalized understanding;Returned demonstration          PT Long Term Goals - 01/31/19 1635      PT LONG TERM GOAL #1   Title  independent with HEP    Status  On-going      PT LONG TERM GOAL #3   Title  report pain < 4/10 with activity for improved function    Status  On-going      PT LONG TERM GOAL #4   Title  demonstrate at least 4/5 bil hip abduction for improved function    Baseline  5/5 ABD bil    Status  Achieved            Plan - 01/31/19 1627    Clinical Impression Statement  Patient presented with increased pain today of insidious onset. She responded very well to extension exercises and manual therapy and reported decreased pain to 5/10 at end of treatment. Overall her pain is staying around 4-5/10 with ADLS. She will benefit from continued lumbar mobility exercises as well as flexibilty in left trunk/LB.    Comorbidities  osteoporosis, arthritis, scoliosis, anxiety, depression, CAD, asthma, multiple orthopedic surgeries    PT Treatment/Interventions  ADLs/Self Care Home Management;Cryotherapy;Electrical Stimulation;Moist Heat;Ultrasound;Therapeutic exercise;Therapeutic activities;Functional mobility training;Iontophoresis 4mg /ml Dexamethasone;Neuromuscular re-education;Patient/family education;Manual techniques;Dry needling;Taping    PT Next Visit Plan  continue left lumbar stretching, strengthening  manual/DN PRN    PT Home Exercise Plan  Access Code: [email or web address]       Patient will benefit from skilled therapeutic intervention in order to improve the following deficits and impairments:  Decreased range of motion, Increased fascial restricitons, Increased muscle spasms,  Pain, Postural dysfunction, Decreased strength, Impaired flexibility  Visit Diagnosis: Acute right-sided low back pain without sciatica  Other symptoms and signs involving the musculoskeletal system  Muscle weakness (generalized)     Problem List Patient Active Problem List   Diagnosis Date Noted  . Dyslipidemia, goal LDL below 70 11/29/2018  .  DJD (degenerative joint disease), lumbosacral 11/29/2018  . Pre-operative clearance 11/29/2018  . Nasal dryness 02/06/2018  . Arthralgia 02/05/2018  . Rhinorrhea 01/04/2018  . Excessive sweating 09/22/2017  . Tongue mass 08/14/2017  . Angina, class III ([place]) 07/07/2017  . CAD S/P percutaneous coronary angioplasty   . Pneumothorax 06/25/2017  . Atypical chest pain 05/24/2017  . Itchy scalp 05/24/2017  . Right leg pain 02/06/2017  . Chronic tension-type headache, intractable 02/05/2017  . Prediabetes 02/05/2017  . Lump in throat 01/27/2017  . Dysphagia 01/27/2017  . No energy 09/15/2016  . Bad taste in mouth 07/26/2016  . Paresthesia 04/03/2016  . Facet hypertrophy of lumbosacral region 04/03/2016  . Chronic back pain 04/03/2016  . Right knee pain 02/19/2016  . Rosacea 02/04/2016  . Vaginal atrophy 01/31/2016  . Idiopathic scoliosis 01/22/2016  . Hip bursitis 12/25/2015  . Biceps tendonitis on right 12/25/2015  . Diverticulitis of colon 12/21/2015  . GERD (gastroesophageal reflux disease) 11/27/2015  . Osteoporosis 11/27/2015  . GAD (generalized anxiety disorder) 11/27/2015  . MDD (major depressive disorder), recurrent, in full remission ([place]) 11/27/2015  . Chronic dryness of both eyes 11/27/2015  . Hypothyroidism 11/27/2015  . Asthma, chronic 11/27/2015  . Insomnia 11/27/2015  . Seborrheic keratoses 11/27/2015   Madelyn Flavors PT 01/31/2019, 4:37 PM  [facility] [place], [place], [address] Phone: [phone]   Fax:  [phone]  Name: Leslie Roberson MRN: [id number] Date of Birth: April 02, 1955

## 2019-02-02 ENCOUNTER — Encounter: Payer: Self-pay | Admitting: Physical Therapy

## 2019-02-02 ENCOUNTER — Other Ambulatory Visit: Payer: Self-pay

## 2019-02-02 ENCOUNTER — Ambulatory Visit (INDEPENDENT_AMBULATORY_CARE_PROVIDER_SITE_OTHER): Payer: BC Managed Care – PPO | Admitting: Physical Therapy

## 2019-02-02 DIAGNOSIS — M545 Low back pain, unspecified: Secondary | ICD-10-CM

## 2019-02-02 DIAGNOSIS — R29898 Other symptoms and signs involving the musculoskeletal system: Secondary | ICD-10-CM | POA: Diagnosis not present

## 2019-02-02 NOTE — Therapy (Signed)
[facility] [place], [place], [address] Phone: [phone]   Fax:  [phone]  Physical Therapy Treatment  Patient Details  Name: Leslie Roberson MRN: [id number] Date of Birth: 26-Apr-1955 Referring Provider (PT): Zonia Kief, MD   Encounter Date: 02/02/2019  PT End of Session - 02/02/19 1105    Visit Number  7    Number of Visits  12    Date for PT Re-Evaluation  02/15/19    PT Start Time  1102    PT Stop Time  1148    PT Time Calculation (min)  46 min    Activity Tolerance  Patient tolerated treatment well    Behavior During Therapy  [facility] for tasks assessed/performed       Past Medical History:  Diagnosis Date  . Anxiety   . Arthritis    "right little finger; base of thumb left hand; spine" (07/07/2017)  . Asthma, chronic 11/27/2015  . Chronic back pain   . Coronary artery disease    4/19 PCI/DEx2 to LAD, and PCI/DES x1 to RCA, normal EF  . Depression   . Diverticulitis of colon 12/21/2015   Colonoscopy every 5 years/digestive health.   . GERD (gastroesophageal reflux disease) 11/27/2015  . History of blood transfusion    "related to spinal fusions"  . Hypothyroidism   . Idiopathic scoliosis 01/22/2016  . Osteoporosis 11/27/2015   On prolia.   . Pneumonia 2016  . Pneumothorax 06/26/2017  . Pre-diabetes   . Thyroid disease     Past Surgical History:  Procedure Laterality Date  . ABDOMINAL HYSTERECTOMY    . bladder mesh     S/P bladder sling"  . CORONARY STENT INTERVENTION N/A 07/07/2017   Procedure: CORONARY STENT INTERVENTION;  Surgeon: Leonie Man, MD;  Location: [facility];  Service: Cardiovascular;  Laterality: N/A;  . ELBOW SURGERY Right    "dislocation"  . INCONTINENCE SURGERY     "didn't work"  . INTRAVASCULAR PRESSURE WIRE/FFR STUDY N/A 07/07/2017   Procedure: INTRAVASCULAR PRESSURE WIRE/FFR STUDY;  Surgeon: Leonie Man, MD;  Location: [facility];   Service: Cardiovascular;  Laterality: N/A;  . JOINT REPLACEMENT    . LEFT HEART CATH AND CORONARY ANGIOGRAPHY N/A 07/07/2017   Procedure: LEFT HEART CATH AND CORONARY ANGIOGRAPHY;  Surgeon: Leonie Man, MD;  Location: [facility];  Service: Cardiovascular;  Laterality: N/A;  . SPINAL FUSION  ~ 1970 X 3   "below neck - lower back"  . TONSILLECTOMY    . TOTAL HIP ARTHROPLASTY Left     There were no vitals filed for this visit.  Subjective Assessment - 02/02/19 1105    Subjective  Patient reports she had some relief after last treatment but only until that afternoon. she has had 7/10 pain since with pain to right gluteal fold.    Pertinent History  Osteopenia, scoliosis, CAD    Patient Stated Goals  move comfortably, be able to lift Rt leg    Currently in Pain?  Yes    Pain Score  7     Pain Location  Back    Pain Orientation  Right    Pain Descriptors / Indicators  Aching    Pain Type  Chronic pain    Pain Relieving Factors  lying on right side in certain posiition  [facility] PT Treatment/Exercise - 02/02/19 0001      Lumbar Exercises: Stretches   Lower Trunk Rotation  30 seconds;3 reps    Lower Trunk Rotation Limitations  to the left with left foot giving overpressure    Quadruped Mid Back Stretch  20 seconds    Quadruped Mid Back Stretch Limitations  with hips to right side    Other Lumbar Stretch Exercise  standing "7" flexion stretch at counter for gentle traction 3x 20 sec      Lumbar Exercises: Aerobic   Nustep  L1 x 6 min      Lumbar Exercises: Supine   Pelvic Tilt  10 reps    Bridge Limitations  painful into right gluteal today      Lumbar Exercises: Sidelying   Other Sidelying Lumbar Exercises  Right SDLY abolishes buttock pain and decreases back pain to 4/10.       Lumbar Exercises: Prone   Straight Leg Raise  10 reps   bil; 2nd set with pillow under waist   Other Prone Lumbar Exercises  pelvic press series 5 x 5  sec hold; unilateral and bil knee bends x 5 ea      Lumbar Exercises: Quadruped   Straight Leg Raises Limitations  attempted but very difficult; modified to do 3 reps each with toe tap extension, but then returned to prone      Manual Therapy   Manual Therapy  Joint mobilization;Manual Traction    Joint Mobilization  Mob with movement L5/S1 right: in sitting 2x10; standing with active lumbar extension 2x10 and SB 2x10    Manual Traction  gentle Long leg traction right  3 x 30 sec                  PT Long Term Goals - 01/31/19 1635      PT LONG TERM GOAL #1   Title  independent with HEP    Status  On-going      PT LONG TERM GOAL #3   Title  report pain < 4/10 with activity for improved function    Status  On-going      PT LONG TERM GOAL #4   Title  demonstrate at least 4/5 bil hip abduction for improved function    Baseline  5/5 ABD bil    Status  Achieved            Plan - 02/02/19 1304    Clinical Impression Statement  Patient presents with continued pain referring into right buttock. She can abolish buttock pain in right SDLY and we were able to eliminate pain in L5/S1 with extension with overpressure by PT. Stretches performed to keep joint space open and moving.    Comorbidities  osteoporosis, arthritis, scoliosis, anxiety, depression, CAD, asthma, multiple orthopedic surgeries    PT Treatment/Interventions  ADLs/Self Care Home Management;Cryotherapy;Electrical Stimulation;Moist Heat;Ultrasound;Therapeutic exercise;Therapeutic activities;Functional mobility training;Iontophoresis 4mg /ml Dexamethasone;Neuromuscular re-education;Patient/family education;Manual techniques;Dry needling;Taping    PT Next Visit Plan  MD note for pt to carry to appt after PT;  continue left lumbar stretching, strengthening  manual/DN PRN    PT Home Exercise Plan  Access Code: [email or web address]       Patient will benefit from skilled therapeutic intervention in order to improve the following  deficits and impairments:  Decreased range of motion, Increased fascial restricitons, Increased muscle spasms, Pain, Postural dysfunction, Decreased strength, Impaired flexibility  Visit Diagnosis: Acute right-sided low back pain without sciatica  Other symptoms and signs involving the  musculoskeletal system     Problem List Patient Active Problem List   Diagnosis Date Noted  . Dyslipidemia, goal LDL below 70 11/29/2018  . DJD (degenerative joint disease), lumbosacral 11/29/2018  . Pre-operative clearance 11/29/2018  . Nasal dryness 02/06/2018  . Arthralgia 02/05/2018  . Rhinorrhea 01/04/2018  . Excessive sweating 09/22/2017  . Tongue mass 08/14/2017  . Angina, class III ([facility]) 07/07/2017  . CAD S/P percutaneous coronary angioplasty   . Pneumothorax 06/25/2017  . Atypical chest pain 05/24/2017  . Itchy scalp 05/24/2017  . Right leg pain 02/06/2017  . Chronic tension-type headache, intractable 02/05/2017  . Prediabetes 02/05/2017  . Lump in throat 01/27/2017  . Dysphagia 01/27/2017  . No energy 09/15/2016  . Bad taste in mouth 07/26/2016  . Paresthesia 04/03/2016  . Facet hypertrophy of lumbosacral region 04/03/2016  . Chronic back pain 04/03/2016  . Right knee pain 02/19/2016  . Rosacea 02/04/2016  . Vaginal atrophy 01/31/2016  . Idiopathic scoliosis 01/22/2016  . Hip bursitis 12/25/2015  . Biceps tendonitis on right 12/25/2015  . Diverticulitis of colon 12/21/2015  . GERD (gastroesophageal reflux disease) 11/27/2015  . Osteoporosis 11/27/2015  . GAD (generalized anxiety disorder) 11/27/2015  . MDD (major depressive disorder), recurrent, in full remission ([place]) 11/27/2015  . Chronic dryness of both eyes 11/27/2015  . Hypothyroidism 11/27/2015  . Asthma, chronic 11/27/2015  . Insomnia 11/27/2015  . Seborrheic keratoses 11/27/2015   Madelyn Flavors PT 02/02/2019, 1:09 PM  [facility] [place], [place], [address] Phone: [phone]   Fax:  [phone]  Name: Leslie Roberson MRN: [id number] Date of Birth: Apr 26, 1955

## 2019-02-03 DIAGNOSIS — Z0000 Encounter for general adult medical examination without abnormal findings: Secondary | ICD-10-CM | POA: Diagnosis not present

## 2019-02-03 DIAGNOSIS — Z13220 Encounter for screening for lipoid disorders: Secondary | ICD-10-CM | POA: Diagnosis not present

## 2019-02-03 DIAGNOSIS — R7303 Prediabetes: Secondary | ICD-10-CM | POA: Diagnosis not present

## 2019-02-03 DIAGNOSIS — E039 Hypothyroidism, unspecified: Secondary | ICD-10-CM | POA: Diagnosis not present

## 2019-02-04 LAB — TSH: TSH: 0.71 mIU/L (ref 0.40–4.50)

## 2019-02-04 LAB — COMPLETE METABOLIC PANEL WITH GFR
AG Ratio: 1.8 (calc) (ref 1.0–2.5)
ALT: 35 U/L — ABNORMAL HIGH (ref 6–29)
AST: 22 U/L (ref 10–35)
Albumin: 4.1 g/dL (ref 3.6–5.1)
Alkaline phosphatase (APISO): 64 U/L (ref 37–153)
BUN/Creatinine Ratio: 19 (calc) (ref 6–22)
BUN: 20 mg/dL (ref 7–25)
CO2: 29 mmol/L (ref 20–32)
Calcium: 9.6 mg/dL (ref 8.6–10.4)
Chloride: 108 mmol/L (ref 98–110)
Creat: 1.08 mg/dL — ABNORMAL HIGH (ref 0.50–0.99)
GFR, Est African American: 63 mL/min/{1.73_m2} (ref >=60)
GFR, Est Non African American: 55 mL/min/{1.73_m2} — ABNORMAL LOW (ref >=60)
Globulin: 2.3 g/dL (calc) (ref 1.9–3.7)
Glucose, Bld: 104 mg/dL — ABNORMAL HIGH (ref 65–99)
Potassium: 4.3 mmol/L (ref 3.5–5.3)
Sodium: 142 mmol/L (ref 135–146)
Total Bilirubin: 0.4 mg/dL (ref 0.2–1.2)
Total Protein: 6.4 g/dL (ref 6.1–8.1)

## 2019-02-04 LAB — HEMOGLOBIN A1C
Hgb A1c MFr Bld: 6.5 % of total Hgb — ABNORMAL HIGH (ref <5.7)
Mean Plasma Glucose: 140 (calc)
eAG (mmol/L): 7.7 (calc)

## 2019-02-04 LAB — LIPID PANEL W/REFLEX DIRECT LDL
Cholesterol: 140 mg/dL (ref <200)
HDL: 85 mg/dL (ref >=50)
LDL Cholesterol (Calc): 42 mg/dL (calc)
Non-HDL Cholesterol (Calc): 55 mg/dL (calc) (ref <130)
Total CHOL/HDL Ratio: 1.6 (calc) (ref <5.0)
Triglycerides: 57 mg/dL (ref <150)

## 2019-02-04 NOTE — Telephone Encounter (Signed)
Will talk about at appt.  Thyroid looks good.  Cholesterol looks fabulous.  A!C is not in diabetic range at 6.5.  Kidney function up a bit.  One liver enzyme elevated.   Luvenia Starch

## 2019-02-07 ENCOUNTER — Ambulatory Visit (INDEPENDENT_AMBULATORY_CARE_PROVIDER_SITE_OTHER): Payer: BC Managed Care – PPO | Admitting: Physician Assistant

## 2019-02-07 ENCOUNTER — Other Ambulatory Visit: Payer: Self-pay

## 2019-02-07 VITALS — BP 131/65 | HR 74 | Ht <= 58 in | Wt 132.0 lb

## 2019-02-07 DIAGNOSIS — Z0000 Encounter for general adult medical examination without abnormal findings: Secondary | ICD-10-CM

## 2019-02-07 DIAGNOSIS — F3342 Major depressive disorder, recurrent, in full remission: Secondary | ICD-10-CM

## 2019-02-07 DIAGNOSIS — R635 Abnormal weight gain: Secondary | ICD-10-CM

## 2019-02-07 DIAGNOSIS — F411 Generalized anxiety disorder: Secondary | ICD-10-CM | POA: Diagnosis not present

## 2019-02-07 DIAGNOSIS — G43009 Migraine without aura, not intractable, without status migrainosus: Secondary | ICD-10-CM

## 2019-02-07 DIAGNOSIS — N3281 Overactive bladder: Secondary | ICD-10-CM

## 2019-02-07 DIAGNOSIS — E039 Hypothyroidism, unspecified: Secondary | ICD-10-CM

## 2019-02-07 DIAGNOSIS — E785 Hyperlipidemia, unspecified: Secondary | ICD-10-CM | POA: Diagnosis not present

## 2019-02-07 DIAGNOSIS — R0683 Snoring: Secondary | ICD-10-CM

## 2019-02-07 DIAGNOSIS — E119 Type 2 diabetes mellitus without complications: Secondary | ICD-10-CM

## 2019-02-07 DIAGNOSIS — G478 Other sleep disorders: Secondary | ICD-10-CM

## 2019-02-07 MED ORDER — METFORMIN HCL 500 MG PO TABS: 500.0000 mg | ORAL_TABLET | Freq: Two times a day (BID) | ORAL | 0 refills | Status: DC

## 2019-02-07 MED ORDER — TOPIRAMATE 50 MG PO TABS: 50.0000 mg | ORAL_TABLET | Freq: Two times a day (BID) | ORAL | 2 refills | Status: DC

## 2019-02-07 MED ORDER — LEVOTHYROXINE SODIUM 137 MCG PO TABS: ORAL_TABLET | ORAL | 3 refills | Status: DC

## 2019-02-07 MED ORDER — ATORVASTATIN CALCIUM 20 MG PO TABS: 20.0000 mg | ORAL_TABLET | Freq: Every day | ORAL | 3 refills | Status: DC

## 2019-02-07 MED ORDER — MYRBETRIQ 50 MG PO TB24: 50.0000 mg | ORAL_TABLET | Freq: Every day | ORAL | 3 refills | Status: DC

## 2019-02-07 NOTE — Patient Instructions (Addendum)
Overactive Bladder, Adult  Overactive bladder refers to a condition in which a person has a sudden need to pass urine. The person may leak urine if he or she cannot get to the bathroom fast enough (urinary incontinence). A person with this condition may also wake up several times in the night to go to the bathroom. Overactive bladder is associated with poor nerve signals between your bladder and your brain. Your bladder may get the signal to empty before it is full. You may also have very sensitive muscles that make your bladder squeeze too soon. These symptoms might interfere with daily work or social activities. What are the causes? This condition may be associated with or caused by:  Urinary tract infection.  Infection of nearby tissues, such as the prostate.  Prostate enlargement.  Surgery on the uterus or urethra.  Bladder stones, inflammation, or tumors.  Drinking too much caffeine or alcohol.  Certain medicines, especially medicines that get rid of extra fluid in the body (diuretics).  Muscle or nerve weakness, especially from: ? A spinal cord injury. ? Stroke. ? Multiple sclerosis. ? Parkinson's disease.  Diabetes.  Constipation. What increases the risk? You may be at greater risk for overactive bladder if you:  Are an older adult.  Smoke.  Are going through menopause.  Have prostate problems.  Have a neurological disease, such as stroke, dementia, Parkinson's disease, or multiple sclerosis (MS).  Eat or drink things that irritate the bladder. These include alcohol, spicy food, and caffeine.  Are overweight or obese. What are the signs or symptoms? Symptoms of this condition include:  Sudden, strong urge to urinate.  Leaking urine.  Urinating 8 or more times a day.  Waking up to urinate 2 or more times a night. How is this diagnosed? Your health care provider may suspect overactive bladder based on your symptoms. He or she will diagnose this condition  by:  A physical exam and medical history.  Blood or urine tests. You might need bladder or urine tests to help determine what is causing your overactive bladder. You might also need to see a health care provider who specializes in urinary tract problems (urologist). How is this treated? Treatment for overactive bladder depends on the cause of your condition and whether it is mild or severe. You can also make lifestyle changes at home. Options include:  Bladder training. This may include: ? Learning to control the urge to urinate by following a schedule that directs you to urinate at regular intervals (timed voiding). ? Doing Kegel exercises to strengthen your pelvic floor muscles, which support your bladder. Toning these muscles can help you control urination, even if your bladder muscles are overactive.  Special devices. This may include: ? Biofeedback, which uses sensors to help you become aware of your body's signals. ? Electrical stimulation, which uses electrodes placed inside the body (implanted) or outside the body. These electrodes send gentle pulses of electricity to strengthen the nerves or muscles that control the bladder. ? Women may use a plastic device that fits into the vagina and supports the bladder (pessary).  Medicines. ? Antibiotics to treat bladder infection. ? Antispasmodics to stop the bladder from releasing urine at the wrong time. ? Tricyclic antidepressants to relax bladder muscles. ? Injections of botulinum toxin type A directly into the bladder tissue to relax bladder muscles.  Lifestyle changes. This may include: ? Weight loss. Talk to your health care provider about weight loss methods that would work best for you. ?   Diet changes. This may include reducing how much alcohol and caffeine you consume, or drinking fluids at different times of the day. ? Not smoking. Do not use any products that contain nicotine or tobacco, such as cigarettes and e-cigarettes. If  you need help quitting, ask your health care provider.  Surgery. ? A device may be implanted to help manage the nerve signals that control urination. ? An electrode may be implanted to stimulate electrical signals in the bladder. ? A procedure may be done to change the shape of the bladder. This is done only in very severe cases. Follow these instructions at home: Lifestyle  Make any diet or lifestyle changes that are recommended by your health care provider. These may include: ? Drinking less fluid or drinking fluids at different times of the day. ? Cutting down on caffeine or alcohol. ? Doing Kegel exercises. ? Losing weight if needed. ? Eating a healthy and balanced diet to prevent constipation. This may include:  Eating foods that are high in fiber, such as fresh fruits and vegetables, whole grains, and beans.  Limiting foods that are high in fat and processed sugars, such as fried and sweet foods. General instructions  Take over-the-counter and prescription medicines only as told by your health care provider.  If you were prescribed an antibiotic medicine, take it as told by your health care provider. Do not stop taking the antibiotic even if you start to feel better.  Use any implants or pessary as told by your health care provider.  If needed, wear pads to absorb urine leakage.  Keep a journal or log to track how much and when you drink and when you feel the need to urinate. This will help your health care provider monitor your condition.  Keep all follow-up visits as told by your health care provider. This is important. Contact a health care provider if:  You have a fever.  Your symptoms do not get better with treatment.  Your pain and discomfort get worse.  You have more frequent urges to urinate. Get help right away if:  You are not able to control your bladder. Summary  Overactive bladder refers to a condition in which a person has a sudden need to pass urine.   Several conditions may lead to an overactive bladder.  Treatment for overactive bladder depends on the cause and severity of your condition.  Follow your health care provider's instructions about lifestyle changes, doing Kegel exercises, keeping a journal, and taking medicines. This information is not intended to replace advice given to you by your health care provider. Make sure you discuss any questions you have with your health care provider. Document Released: 01/11/2009 Document Revised: 07/08/2018 Document Reviewed: 04/02/2017 Elsevier Patient Education  2020 [employer] Maintenance After Age 20 After age 39, you are at a higher risk for certain long-term diseases and infections as well as injuries from falls. Falls are a major cause of broken bones and head injuries in people who are older than age 37. Getting regular preventive care can help to keep you healthy and well. Preventive care includes getting regular testing and making lifestyle changes as recommended by your health care provider. Talk with your health care provider about:  Which screenings and tests you should have. A screening is a test that checks for a disease when you have no symptoms.  A diet and exercise plan that is right for you. What should I know about screenings and tests to prevent falls?  Screening and testing are the best ways to find a health problem early. Early diagnosis and treatment give you the best chance of managing medical conditions that are common after age 2. Certain conditions and lifestyle choices may make you more likely to have a fall. Your health care provider may recommend:  Regular vision checks. Poor vision and conditions such as cataracts can make you more likely to have a fall. If you wear glasses, make sure to get your prescription updated if your vision changes.  Medicine review. Work with your health care provider to regularly review all of the medicines you are taking,  including over-the-counter medicines. Ask your health care provider about any side effects that may make you more likely to have a fall. Tell your health care provider if any medicines that you take make you feel dizzy or sleepy.  Osteoporosis screening. Osteoporosis is a condition that causes the bones to get weaker. This can make the bones weak and cause them to break more easily.  Blood pressure screening. Blood pressure changes and medicines to control blood pressure can make you feel dizzy.  Strength and balance checks. Your health care provider may recommend certain tests to check your strength and balance while standing, walking, or changing positions.  Foot health exam. Foot pain and numbness, as well as not wearing proper footwear, can make you more likely to have a fall.  Depression screening. You may be more likely to have a fall if you have a fear of falling, feel emotionally low, or feel unable to do activities that you used to do.  Alcohol use screening. Using too much alcohol can affect your balance and may make you more likely to have a fall. What actions can I take to lower my risk of falls? General instructions  Talk with your health care provider about your risks for falling. Tell your health care provider if: ? You fall. Be sure to tell your health care provider about all falls, even ones that seem minor. ? You feel dizzy, sleepy, or off-balance.  Take over-the-counter and prescription medicines only as told by your health care provider. These include any supplements.  Eat a healthy diet and maintain a healthy weight. A healthy diet includes low-fat dairy products, low-fat (lean) meats, and fiber from whole grains, beans, and lots of fruits and vegetables. Home safety  Remove any tripping hazards, such as rugs, cords, and clutter.  Install safety equipment such as grab bars in bathrooms and safety rails on stairs.  Keep rooms and walkways well-lit. Activity    Follow a regular exercise program to stay fit. This will help you maintain your balance. Ask your health care provider what types of exercise are appropriate for you.  If you need a cane or walker, use it as recommended by your health care provider.  Wear supportive shoes that have nonskid soles. Lifestyle  Do not drink alcohol if your health care provider tells you not to drink.  If you drink alcohol, limit how much you have: ? 0-1 drink a day for women. ? 0-2 drinks a day for men.  Be aware of how much alcohol is in your drink. In the [place] one drink equals one typical bottle of beer (12 oz), one-half glass of wine (5 oz), or one shot of hard liquor (1 oz).  Do not use any products that contain nicotine or tobacco, such as cigarettes and e-cigarettes. If you need help quitting, ask your health care provider. Summary  Having  a healthy lifestyle and getting preventive care can help to protect your health and wellness after age 68.  Screening and testing are the best way to find a health problem early and help you avoid having a fall. Early diagnosis and treatment give you the best chance for managing medical conditions that are more common for people who are older than age 58.  Falls are a major cause of broken bones and head injuries in people who are older than age 68. Take precautions to prevent a fall at home.  Work with your health care provider to learn what changes you can make to improve your health and wellness and to prevent falls. This information is not intended to replace advice given to you by your health care provider. Make sure you discuss any questions you have with your health care provider. Document Released: 01/28/2017 Document Revised: 07/08/2018 Document Reviewed: 01/28/2017 Elsevier Patient Education  [employer].  Diabetes Mellitus and Nutrition, Adult When you have diabetes (diabetes mellitus), it is very important to have healthy eating habits because  your blood sugar (glucose) levels are greatly affected by what you eat and drink. Eating healthy foods in the appropriate amounts, at about the same times every day, can help you:  Control your blood glucose.  Lower your risk of heart disease.  Improve your blood pressure.  Reach or maintain a healthy weight. Every person with diabetes is different, and each person has different needs for a meal plan. Your health care provider may recommend that you work with a diet and nutrition specialist (dietitian) to make a meal plan that is best for you. Your meal plan may vary depending on factors such as:  The calories you need.  The medicines you take.  Your weight.  Your blood glucose, blood pressure, and cholesterol levels.  Your activity level.  Other health conditions you have, such as heart or kidney disease. How do carbohydrates affect me? Carbohydrates, also called carbs, affect your blood glucose level more than any other type of food. Eating carbs naturally raises the amount of glucose in your blood. Carb counting is a method for keeping track of how many carbs you eat. Counting carbs is important to keep your blood glucose at a healthy level, especially if you use insulin or take certain oral diabetes medicines. It is important to know how many carbs you can safely have in each meal. This is different for every person. Your dietitian can help you calculate how many carbs you should have at each meal and for each snack. Foods that contain carbs include:  Bread, cereal, rice, pasta, and crackers.  Potatoes and corn.  Peas, beans, and lentils.  Milk and yogurt.  Fruit and juice.  Desserts, such as cakes, cookies, ice cream, and candy. How does alcohol affect me? Alcohol can cause a sudden decrease in blood glucose (hypoglycemia), especially if you use insulin or take certain oral diabetes medicines. Hypoglycemia can be a life-threatening condition. Symptoms of hypoglycemia  (sleepiness, dizziness, and confusion) are similar to symptoms of having too much alcohol. If your health care provider says that alcohol is safe for you, follow these guidelines:  Limit alcohol intake to no more than 1 drink per day for nonpregnant women and 2 drinks per day for men. One drink equals 12 oz of beer, 5 oz of wine, or 1 oz of hard liquor.  Do not drink on an empty stomach.  Keep yourself hydrated with water, diet soda, or unsweetened iced  tea.  Keep in mind that regular soda, juice, and other mixers may contain a lot of sugar and must be counted as carbs. What are tips for following this plan?  Reading food labels  Start by checking the serving size on the "Nutrition Facts" label of packaged foods and drinks. The amount of calories, carbs, fats, and other nutrients listed on the label is based on one serving of the item. Many items contain more than one serving per package.  Check the total grams (g) of carbs in one serving. You can calculate the number of servings of carbs in one serving by dividing the total carbs by 15. For example, if a food has 30 g of total carbs, it would be equal to 2 servings of carbs.  Check the number of grams (g) of saturated and trans fats in one serving. Choose foods that have low or no amount of these fats.  Check the number of milligrams (mg) of salt (sodium) in one serving. Most people should limit total sodium intake to less than 2,300 mg per day.  Always check the nutrition information of foods labeled as "low-fat" or "nonfat". These foods may be higher in added sugar or refined carbs and should be avoided.  Talk to your dietitian to identify your daily goals for nutrients listed on the label. Shopping  Avoid buying canned, premade, or processed foods. These foods tend to be high in fat, sodium, and added sugar.  Shop around the outside edge of the grocery store. This includes fresh fruits and vegetables, bulk grains, fresh meats, and  fresh dairy. Cooking  Use low-heat cooking methods, such as baking, instead of high-heat cooking methods like deep frying.  Cook using healthy oils, such as olive, canola, or sunflower oil.  Avoid cooking with butter, cream, or high-fat meats. Meal planning  Eat meals and snacks regularly, preferably at the same times every day. Avoid going long periods of time without eating.  Eat foods high in fiber, such as fresh fruits, vegetables, beans, and whole grains. Talk to your dietitian about how many servings of carbs you can eat at each meal.  Eat 4-6 ounces (oz) of lean protein each day, such as lean meat, chicken, fish, eggs, or tofu. One oz of lean protein is equal to: ? 1 oz of meat, chicken, or fish. ? 1 egg. ?  cup of tofu.  Eat some foods each day that contain healthy fats, such as avocado, nuts, seeds, and fish. Lifestyle  Check your blood glucose regularly.  Exercise regularly as told by your health care provider. This may include: ? 150 minutes of moderate-intensity or vigorous-intensity exercise each week. This could be brisk walking, biking, or water aerobics. ? Stretching and doing strength exercises, such as yoga or weightlifting, at least 2 times a week.  Take medicines as told by your health care provider.  Do not use any products that contain nicotine or tobacco, such as cigarettes and e-cigarettes. If you need help quitting, ask your health care provider.  Work with a Social worker or diabetes educator to identify strategies to manage stress and any emotional and social challenges. Questions to ask a health care provider  Do I need to meet with a diabetes educator?  Do I need to meet with a dietitian?  What number can I call if I have questions?  When are the best times to check my blood glucose? Where to find more information:  American Diabetes Association: diabetes.org  Academy of Nutrition and  Dietetics: www.eatright.[employer] of Diabetes  and Digestive and Kidney Diseases (NIH): [employer] Summary  A healthy meal plan will help you control your blood glucose and maintain a healthy lifestyle.  Working with a diet and nutrition specialist (dietitian) can help you make a meal plan that is best for you.  Keep in mind that carbohydrates (carbs) and alcohol have immediate effects on your blood glucose levels. It is important to count carbs and to use alcohol carefully. This information is not intended to replace advice given to you by your health care provider. Make sure you discuss any questions you have with your health care provider. Document Released: 12/12/2004 Document Revised: 02/27/2017 Document Reviewed: 04/21/2016 Elsevier Patient Education  2020 [employer].

## 2019-02-07 NOTE — Progress Notes (Signed)
Subjective:     Leslie Roberson is a 63 y.o. female and is here for a comprehensive physical exam. The patient reports problems - pt is gaining weight. she is snoring more and wakes up not feeling rested. she can'ts seem to stop breathing through her mouth.    Social History   Socioeconomic History  . Marital status: Married    Spouse name: Not on file  . Number of children: 1  . Years of education: Not on file  . Highest education level: Not on file  Occupational History  . Not on file  Social Needs  . Financial resource strain: Not on file  . Food insecurity    Worry: Not on file    Inability: Not on file  . Transportation needs    Medical: Not on file    Non-medical: Not on file  Tobacco Use  . Smoking status: Never Smoker  . Smokeless tobacco: Never Used  Substance and Sexual Activity  . Alcohol use: Yes    Comment: 07/07/2017 "5-6 times/year; 1 drink at each occasion"  . Drug use: No  . Sexual activity: Yes  Lifestyle  . Physical activity    Days per week: Not on file    Minutes per session: Not on file  . Stress: Not on file  Relationships  . Social Herbalist on phone: Not on file    Gets together: Not on file    Attends religious service: Not on file    Active member of club or organization: Not on file    Attends meetings of clubs or organizations: Not on file    Relationship status: Not on file  . Intimate partner violence    Fear of current or ex partner: Not on file    Emotionally abused: Not on file    Physically abused: Not on file    Forced sexual activity: Not on file  Other Topics Concern  . Not on file  Social History Narrative  . Not on file   Health Maintenance  Topic Date Due  . FOOT EXAM  09/17/1965  . OPHTHALMOLOGY EXAM  09/17/1965  . URINE MICROALBUMIN  09/17/1965  . HEMOGLOBIN A1C  08/03/2019  . MAMMOGRAM  01/16/2020  . DEXA SCAN  03/10/2020  . COLONOSCOPY  12/19/2020  . PAP SMEAR-Modifier  01/29/2021  . TETANUS/TDAP   01/29/2026  . INFLUENZA VACCINE  Completed  . PNEUMOCOCCAL POLYSACCHARIDE VACCINE AGE 59-64 HIGH RISK  Completed  . Hepatitis C Screening  Completed  . HIV Screening  Completed    The following portions of the patient's history were reviewed and updated as appropriate: allergies, current medications, past family history, past medical history, past social history, past surgical history and problem list.  Review of Systems Pertinent items noted in HPI and remainder of comprehensive ROS otherwise negative.   Objective:    BP 131/65   Pulse 74   Ht 4\' 10"  (1.473 m)   Wt 132 lb (59.9 kg)   SpO2 98%   BMI 27.59 kg/m  General appearance: alert, cooperative and appears stated age Head: Normocephalic, without obvious abnormality, atraumatic Eyes: conjunctivae/corneas clear. PERRL, EOM's intact. Fundi benign. Ears: normal TM's and external ear canals both ears Nose: Nares normal. Septum midline. Mucosa normal. No drainage or sinus tenderness. Throat: lips, mucosa, and tongue normal; teeth and gums normal Neck: no adenopathy, no carotid bruit, no JVD, supple, symmetrical, trachea midline and thyroid not enlarged, symmetric, no tenderness/mass/nodules Back: scolosis.  Lungs: clear to auscultation bilaterally Heart: regular rate and rhythm, S1, S2 normal, no murmur, click, rub or gallop Abdomen: soft, non-tender; bowel sounds normal; no masses,  no organomegaly Extremities: extremities normal, atraumatic, no cyanosis or edema Pulses: 2+ and symmetric Skin: Skin color, texture, turgor normal. No rashes or lesions Lymph nodes: Cervical, supraclavicular, and axillary nodes normal. Neurologic: Alert and oriented X 3, normal strength and tone. Normal symmetric reflexes. Normal coordination and gait    Assessment:    Healthy female exam.      Plan:      Marland KitchenMarland KitchenRachele was seen today for annual exam.  Diagnoses and all orders for this visit:  Routine physical examination  Hyperlipidemia LDL  goal <70 -     atorvastatin (LIPITOR) 20 MG tablet; Take 1 tablet (20 mg total) by mouth daily.  GAD (generalized anxiety disorder)  MDD (major depressive disorder), recurrent, in full remission ([place])  Diabetes mellitus without complication ([place]) -     metFORMIN (GLUCOPHAGE) 500 MG tablet; Take 1 tablet (500 mg total) by mouth 2 (two) times daily with a meal.  Acquired hypothyroidism -     levothyroxine (SYNTHROID) 137 MCG tablet; TAKE 1/2 TABLET DAILY 5 DAYS A WEEK AND 1 TABLET DAILY 2 DAYS A WEEK.  OAB (overactive bladder) -     MYRBETRIQ 50 MG TB24 tablet; Take 1 tablet (50 mg total) by mouth daily.  Migraine without aura and without status migrainosus, not intractable -     topiramate (TOPAMAX) 50 MG tablet; Take 1 tablet (50 mg total) by mouth 2 (two) times daily.   .. Depression screen [facility] 2/9 02/07/2019 02/10/2018 05/22/2017 10/23/2016  Decreased Interest 1 1 1 1   Down, Depressed, Hopeless 0 1 2 0  PHQ - 2 Score 1 2 3 1   Altered sleeping 1 2 3 3   Tired, decreased energy 1 1 2 2   Change in appetite 0 0 3 3  Feeling bad or failure about yourself  0 1 1 0  Trouble concentrating 0 0 0 0  Moving slowly or fidgety/restless 0 0 0 0  Suicidal thoughts 0 0 0 0  PHQ-9 Score 3 6 12 9   Difficult doing work/chores Not difficult at all Somewhat difficult Somewhat difficult -  Some encounter information is confidential and restricted. Go to Review Flowsheets activity to see all data.  Some recent data might be hidden   .Marland Kitchen Discussed 150 minutes of exercise a week.  Encouraged vitamin D 1000 units and Calcium 1300mg  or 4 servings of dairy a day.  Discussed labs.  Pap UTD. Mammogram scheduled.  DEXA UTD.next prolia 03/2019.  Colonoscopy UTD. Shingles UTD.  Flu and pneumonia UTD.   New dx of diabetes with a1c 6.5.  Discussed CV increased risk and complication.  Discussed DM diet. Encouraged exercise.  Started metformin. Discussed SE.  On STATN. BP controlled.  Vaccines up to  date.  Needs eye exam.  Follow up in 3 months.   Discussed OAB. Continue on myrbetriq. Discussed kegals.  Limit caffiene.  May need to consider urology for bladder tack surgery etc.   Snoring/weight gain/diabetes. Will order sleep study.    See After Visit Summary for Counseling Recommendations

## 2019-02-08 ENCOUNTER — Encounter: Payer: Self-pay | Admitting: Physician Assistant

## 2019-02-08 DIAGNOSIS — G43009 Migraine without aura, not intractable, without status migrainosus: Secondary | ICD-10-CM | POA: Insufficient documentation

## 2019-02-08 DIAGNOSIS — N3281 Overactive bladder: Secondary | ICD-10-CM | POA: Insufficient documentation

## 2019-02-09 ENCOUNTER — Ambulatory Visit (INDEPENDENT_AMBULATORY_CARE_PROVIDER_SITE_OTHER): Payer: BC Managed Care – PPO | Admitting: Physical Therapy

## 2019-02-09 ENCOUNTER — Encounter: Payer: Self-pay | Admitting: Physical Therapy

## 2019-02-09 ENCOUNTER — Other Ambulatory Visit: Payer: Self-pay

## 2019-02-09 DIAGNOSIS — M5136 Other intervertebral disc degeneration, lumbar region: Secondary | ICD-10-CM | POA: Diagnosis not present

## 2019-02-09 DIAGNOSIS — R29898 Other symptoms and signs involving the musculoskeletal system: Secondary | ICD-10-CM | POA: Diagnosis not present

## 2019-02-09 DIAGNOSIS — M6281 Muscle weakness (generalized): Secondary | ICD-10-CM

## 2019-02-09 DIAGNOSIS — M545 Low back pain, unspecified: Secondary | ICD-10-CM

## 2019-02-09 DIAGNOSIS — M47816 Spondylosis without myelopathy or radiculopathy, lumbar region: Secondary | ICD-10-CM | POA: Diagnosis not present

## 2019-02-09 NOTE — Therapy (Signed)
[place], [place], [address] Phone: [phone]   Fax:  [phone]  Physical Therapy Treatment  Patient Details  Name: Leslie Roberson MRN: [id number] Date of Birth: 1955/04/18 Referring Provider (PT): Zonia Kief, MD   Encounter Date: 02/09/2019  PT End of Session - 02/09/19 1104    Visit Number  8    Number of Visits  12    Date for PT Re-Evaluation  02/15/19    PT Start Time  1105    PT Stop Time  1145    PT Time Calculation (min)  40 min    Activity Tolerance  Patient tolerated treatment well    Behavior During Therapy  [facility] for tasks assessed/performed       Past Medical History:  Diagnosis Date  . Anxiety   . Arthritis    "right little finger; base of thumb left hand; spine" (07/07/2017)  . Asthma, chronic 11/27/2015  . Chronic back pain   . Coronary artery disease    4/19 PCI/DEx2 to LAD, and PCI/DES x1 to RCA, normal EF  . Depression   . Diverticulitis of colon 12/21/2015   Colonoscopy every 5 years/digestive health.   . GERD (gastroesophageal reflux disease) 11/27/2015  . History of blood transfusion    "related to spinal fusions"  . Hypothyroidism   . Idiopathic scoliosis 01/22/2016  . Osteoporosis 11/27/2015   On prolia.   . Pneumonia 2016  . Pneumothorax 06/26/2017  . Pre-diabetes   . Thyroid disease     Past Surgical History:  Procedure Laterality Date  . ABDOMINAL HYSTERECTOMY    . bladder mesh     S/P bladder sling"  . CORONARY STENT INTERVENTION N/A 07/07/2017   Procedure: CORONARY STENT INTERVENTION;  Surgeon: Leonie Man, MD;  Location: [place];  Service: Cardiovascular;  Laterality: N/A;  . ELBOW SURGERY Right    "dislocation"  . INCONTINENCE SURGERY     "didn't work"  . INTRAVASCULAR PRESSURE WIRE/FFR STUDY N/A 07/07/2017   Procedure: INTRAVASCULAR PRESSURE WIRE/FFR STUDY;  Surgeon: Leonie Man, MD;  Location: [place] [facility];   Service: Cardiovascular;  Laterality: N/A;  . JOINT REPLACEMENT    . LEFT HEART CATH AND CORONARY ANGIOGRAPHY N/A 07/07/2017   Procedure: LEFT HEART CATH AND CORONARY ANGIOGRAPHY;  Surgeon: Leonie Man, MD;  Location: [place] [facility];  Service: Cardiovascular;  Laterality: N/A;  . SPINAL FUSION  ~ 1970 X 3   "below neck - lower back"  . TONSILLECTOMY    . TOTAL HIP ARTHROPLASTY Left     There were no vitals filed for this visit.  Subjective Assessment - 02/09/19 1105    Subjective  Patient reports no leg pain since last visit. Now pain is just in right side of back. Patient still having difficulty lifting right leg especially with donning socks.    Pertinent History  Osteopenia, scoliosis, CAD    Patient Stated Goals  move comfortably, be able to lift Rt leg    Currently in Pain?  Yes    Pain Score  8     Pain Location  Back    Pain Orientation  Right    Pain Descriptors / Indicators  Aching    Pain Type  Chronic pain         [facility] PT Assessment - 02/09/19 0001      Assessment   Medical Diagnosis  Lumbar spondylosis    Referring Provider (PT)  Zonia Kief, MD      Observation/Other Assessments   Focus on Therapeutic Outcomes (FOTO)   51% limited      Strength   Right Hip Flexion  4+/5                   [facility] Adult PT Treatment/Exercise - 02/09/19 0001      Self-Care   Self-Care  Other Self-Care Comments    Other Self-Care Comments   Discussed POC and role of scoliosis on LBP      Lumbar Exercises: Stretches   Lower Trunk Rotation  1 rep;60 seconds    Lower Trunk Rotation Limitations  to the left with left foot giving overpressure    Hip Flexor Stretch  Right;2 reps;30 seconds    Hip Flexor Stretch Limitations  seated      Lumbar Exercises: Aerobic   Nustep  L5 x 8 min      Lumbar Exercises: Supine   Other Supine Lumbar Exercises  90/90 position with red Tband around feet: isometric hip flex Rt with active leg press left x 10      Manual  Therapy   Manual Therapy  Soft tissue mobilization;Myofascial release    Manual therapy comments  skilled palpation and monitoring of soft tissue during DN      Myofascial Release  to right QL in left [facility]       Trigger Point Dry Needling - 02/09/19 0001    Consent Given?  Yes    Education Handout Provided  Previously provided    Muscles Treated Back/Hip  Gluteus medius    Dry Needling Comments  right near PSIS    Gluteus Medius Response  Palpable increased muscle length;Twitch response elicited                PT Long Term Goals - 02/09/19 1139      PT LONG TERM GOAL #1   Title  independent with HEP    Period  Weeks    Status  Partially Met      PT LONG TERM GOAL #2   Title  improve FOTO score to </=51% limited for improved function    Time  6    Period  Weeks    Status  Achieved      PT LONG TERM GOAL #3   Title  report pain < 4/10 with activity for improved function    Baseline  8/10 in right low back on average    Status  Not Met      PT LONG TERM GOAL #4   Title  demonstrate at least 4/5 bil hip abduction for improved function    Status  Achieved            Plan - 02/09/19 1140    Clinical Impression Statement  Patient is has met some LTGs, but continues to have pain in her Right LB at L5/S1. Pain with extension and relief with flexion. Right buttock pain has resoloved x 1 week. She has tightness in her right hip flexo and weakness with hip flexion affecting donning socks and other ADLS. Pain awakes her at night and radiates to hip. She has responded well to DN in gluteals to help with pain, but it does not last.    Comorbidities  osteoporosis, arthritis, scoliosis, anxiety, depression, CAD, asthma, multiple orthopedic surgeries    PT Frequency  2x / week    PT Duration  6 weeks    PT Treatment/Interventions  ADLs/Self  Care Home Management;Cryotherapy;Electrical Stimulation;Moist Heat;Ultrasound;Therapeutic exercise;Therapeutic activities;Functional  mobility training;Iontophoresis 57m/ml Dexamethasone;Neuromuscular re-education;Patient/family education;Manual techniques;Dry needling;Taping    PT Next Visit Plan  continue left lumbar stretching,core strengthening  manual/DN PRN    Consulted and Agree with Plan of Care  Patient       Patient will benefit from skilled therapeutic intervention in order to improve the following deficits and impairments:  Decreased range of motion, Increased fascial restricitons, Increased muscle spasms, Pain, Postural dysfunction, Decreased strength, Impaired flexibility  Visit Diagnosis: Acute right-sided low back pain without sciatica  Other symptoms and signs involving the musculoskeletal system  Muscle weakness (generalized)     Problem List Patient Active Problem List   Diagnosis Date Noted  . Migraine without aura and without status migrainosus, not intractable 02/08/2019  . OAB (overactive bladder) 02/08/2019  . Diabetes mellitus without complication ([facility] 187/86/7672 . Hyperlipidemia LDL goal <70 11/29/2018  . DJD (degenerative joint disease), lumbosacral 11/29/2018  . Pre-operative clearance 11/29/2018  . Nasal dryness 02/06/2018  . Arthralgia 02/05/2018  . Rhinorrhea 01/04/2018  . Excessive sweating 09/22/2017  . Tongue mass 08/14/2017  . Angina, class III ([place] 07/07/2017  . CAD S/P percutaneous coronary angioplasty   . Pneumothorax 06/25/2017  . Atypical chest pain 05/24/2017  . Itchy scalp 05/24/2017  . Right leg pain 02/06/2017  . Chronic tension-type headache, intractable 02/05/2017  . Prediabetes 02/05/2017  . Lump in throat 01/27/2017  . Dysphagia 01/27/2017  . No energy 09/15/2016  . Bad taste in mouth 07/26/2016  . Paresthesia 04/03/2016  . Facet hypertrophy of lumbosacral region 04/03/2016  . Chronic back pain 04/03/2016  . Right knee pain 02/19/2016  . Rosacea 02/04/2016  . Vaginal atrophy 01/31/2016  . Idiopathic scoliosis 01/22/2016  . Hip bursitis 12/25/2015   . Biceps tendonitis on right 12/25/2015  . Diverticulitis of colon 12/21/2015  . GERD (gastroesophageal reflux disease) 11/27/2015  . Osteoporosis 11/27/2015  . GAD (generalized anxiety disorder) 11/27/2015  . MDD (major depressive disorder), recurrent, in full remission ([place] 11/27/2015  . Chronic dryness of both eyes 11/27/2015  . Acquired hypothyroidism 11/27/2015  . Asthma, chronic 11/27/2015  . Insomnia 11/27/2015  . Seborrheic keratoses 11/27/2015    JMadelyn FlavorsPT 02/09/2019, 7:17 PM  [facility] [address]Phone: [phone]  Fax:  [phone] Name: MArlean ThiesMRN: [id number] of Birth: 630-Oct-1957

## 2019-02-11 ENCOUNTER — Ambulatory Visit (INDEPENDENT_AMBULATORY_CARE_PROVIDER_SITE_OTHER): Payer: BC Managed Care – PPO | Admitting: Physical Therapy

## 2019-02-11 ENCOUNTER — Other Ambulatory Visit: Payer: Self-pay

## 2019-02-11 DIAGNOSIS — R531 Weakness: Secondary | ICD-10-CM

## 2019-02-11 DIAGNOSIS — M6281 Muscle weakness (generalized): Secondary | ICD-10-CM | POA: Diagnosis not present

## 2019-02-11 DIAGNOSIS — M545 Low back pain, unspecified: Secondary | ICD-10-CM

## 2019-02-11 DIAGNOSIS — M25551 Pain in right hip: Secondary | ICD-10-CM

## 2019-02-11 DIAGNOSIS — R29898 Other symptoms and signs involving the musculoskeletal system: Secondary | ICD-10-CM

## 2019-02-11 NOTE — Therapy (Signed)
[place], [place], [address] Phone: [phone]   Fax:  [phone]  Physical Therapy Treatment  Patient Details  Name: Leslie Roberson MRN: [id number] Date of Birth: 03/06/1956 Referring Provider (PT): Zonia Kief, MD   Encounter Date: 02/11/2019  PT End of Session - 02/11/19 1412    Visit Number  9    Number of Visits  12    Date for PT Re-Evaluation  02/15/19    PT Start Time  1401    PT Stop Time  1450    PT Time Calculation (min)  49 min    Activity Tolerance  Patient tolerated treatment well    Behavior During Therapy  [facility] for tasks assessed/performed       Past Medical History:  Diagnosis Date  . Anxiety   . Arthritis    "right little finger; base of thumb left hand; spine" (07/07/2017)  . Asthma, chronic 11/27/2015  . Chronic back pain   . Coronary artery disease    4/19 PCI/DEx2 to LAD, and PCI/DES x1 to RCA, normal EF  . Depression   . Diverticulitis of colon 12/21/2015   Colonoscopy every 5 years/digestive health.   . GERD (gastroesophageal reflux disease) 11/27/2015  . History of blood transfusion    "related to spinal fusions"  . Hypothyroidism   . Idiopathic scoliosis 01/22/2016  . Osteoporosis 11/27/2015   On prolia.   . Pneumonia 2016  . Pneumothorax 06/26/2017  . Pre-diabetes   . Thyroid disease     Past Surgical History:  Procedure Laterality Date  . ABDOMINAL HYSTERECTOMY    . bladder mesh     S/P bladder sling"  . CORONARY STENT INTERVENTION N/A 07/07/2017   Procedure: CORONARY STENT INTERVENTION;  Surgeon: Leonie Man, MD;  Location: [place] [facility];  Service: Cardiovascular;  Laterality: N/A;  . ELBOW SURGERY Right    "dislocation"  . INCONTINENCE SURGERY     "didn't work"  . INTRAVASCULAR PRESSURE WIRE/FFR STUDY N/A 07/07/2017   Procedure: INTRAVASCULAR PRESSURE WIRE/FFR STUDY;  Surgeon: Leonie Man, MD;  Location: [place] [facility];   Service: Cardiovascular;  Laterality: N/A;  . JOINT REPLACEMENT    . LEFT HEART CATH AND CORONARY ANGIOGRAPHY N/A 07/07/2017   Procedure: LEFT HEART CATH AND CORONARY ANGIOGRAPHY;  Surgeon: Leonie Man, MD;  Location: [place] [facility];  Service: Cardiovascular;  Laterality: N/A;  . SPINAL FUSION  ~ 1970 X 3   "below neck - lower back"  . TONSILLECTOMY    . TOTAL HIP ARTHROPLASTY Left     There were no vitals filed for this visit.  Subjective Assessment - 02/11/19 1417    Subjective  Pt reports she is to get a nerve ablation; waiting on insurance before it is scheduled.      Pain is 7/10 in Rt low back; unsure what relieves pain, extension and walking increase pain.      [place] [facility] Adult PT Treatment/Exercise - 02/11/19 0001      Lumbar Exercises: Stretches   Hip Flexor Stretch  Right;2 reps;60 seconds   Lt side x 1 rep   Hip Flexor Stretch Limitations  seated    Quadruped Mid Back Stretch  --   verbally reviewed cat/cow   Piriformis Stretch  Right;2 reps;Left;1 rep;30 seconds   modified table stretch   Other Lumbar Stretch Exercise  supine Rt knee towards opp shoulder to stretch glute x 60 sec  Lumbar Exercises: Standing   Row  Strengthening;Both;10 reps    Theraband Level (Row)  Level 3 (Green)    Other Standing Lumbar Exercises  standing Lt lateral shifts x 10 sec x 4 reps       Lumbar Exercises: Supine   Other Supine Lumbar Exercises  supine reverse c stretch - increased pain; switched to standing lateral shift with improved tolerance      Moist Heat Therapy   Number Minutes Moist Heat  12 Minutes    Moist Heat Location  Lumbar Spine   pt in Lt sidelying     Electrical Stimulation   Electrical Stimulation Location  Rt low back / glute     Electrical Stimulation Action  IFC    Electrical Stimulation Parameters  to tolerance    Electrical Stimulation Goals  Pain      Manual Therapy   Soft tissue mobilization  Rt glute and Rt lumbar paraspinals, QL.      Manual Traction  gentle Long leg traction right  3 x 30 sec        PT Long Term Goals - 02/09/19 1139      PT LONG TERM GOAL #1   Title  independent with HEP    Period  Weeks    Status  Partially Met      PT LONG TERM GOAL #2   Title  improve FOTO score to </=51% limited for improved function    Time  6    Period  Weeks    Status  Achieved      PT LONG TERM GOAL #3   Title  report pain < 4/10 with activity for improved function    Baseline  8/10 in right low back on average    Status  Not Met      PT LONG TERM GOAL #4   Title  demonstrate at least 4/5 bil hip abduction for improved function    Status  Achieved       Assessment:  Pt reporting minimal change in pain since last visit.  Pt tolerated most exercises well, except supine C stretch.  Pt reported reduction of pain with use of estim/MHP at end of session.  No new goals met this visit.   Plan:  continue left lumbar stretching,core strengthening manual/DN PRN. Assess goals and need for further therapy vs hold/ D/C.       Patient will benefit from skilled therapeutic intervention in order to improve the following deficits and impairments:     Visit Diagnosis: Acute right-sided low back pain without sciatica  Other symptoms and signs involving the musculoskeletal system  Muscle weakness (generalized)  Weakness generalized  Pain of right hip joint     Problem List Patient Active Problem List   Diagnosis Date Noted  . Migraine without aura and without status migrainosus, not intractable 02/08/2019  . OAB (overactive bladder) 02/08/2019  . Diabetes mellitus without complication ([place]) 38/18/2993  . Hyperlipidemia LDL goal <70 11/29/2018  . DJD (degenerative joint disease), lumbosacral 11/29/2018  . Pre-operative clearance 11/29/2018  . Nasal dryness 02/06/2018  . Arthralgia 02/05/2018  . Rhinorrhea 01/04/2018  . Excessive sweating 09/22/2017  . Tongue mass 08/14/2017  . Angina, class III ([place])  07/07/2017  . CAD S/P percutaneous coronary angioplasty   . Pneumothorax 06/25/2017  . Atypical chest pain 05/24/2017  . Itchy scalp 05/24/2017  . Right leg pain 02/06/2017  . Chronic tension-type headache, intractable 02/05/2017  . Prediabetes 02/05/2017  . Lump in  throat 01/27/2017  . Dysphagia 01/27/2017  . No energy 09/15/2016  . Bad taste in mouth 07/26/2016  . Paresthesia 04/03/2016  . Facet hypertrophy of lumbosacral region 04/03/2016  . Chronic back pain 04/03/2016  . Right knee pain 02/19/2016  . Rosacea 02/04/2016  . Vaginal atrophy 01/31/2016  . Idiopathic scoliosis 01/22/2016  . Hip bursitis 12/25/2015  . Biceps tendonitis on right 12/25/2015  . Diverticulitis of colon 12/21/2015  . GERD (gastroesophageal reflux disease) 11/27/2015  . Osteoporosis 11/27/2015  . GAD (generalized anxiety disorder) 11/27/2015  . MDD (major depressive disorder), recurrent, in full remission ([place]) 11/27/2015  . Chronic dryness of both eyes 11/27/2015  . Acquired hypothyroidism 11/27/2015  . Asthma, chronic 11/27/2015  . Insomnia 11/27/2015  . Seborrheic keratoses 11/27/2015   Kerin Perna, PTA 02/11/19 4:46 PM  [facility], [place], [address] Phone: [phone]   Fax:  [phone]  Name: Leslie Roberson MRN: [id number] Date of Birth: 1955-12-12

## 2019-02-14 DIAGNOSIS — F333 Major depressive disorder, recurrent, severe with psychotic symptoms: Secondary | ICD-10-CM | POA: Diagnosis not present

## 2019-02-16 ENCOUNTER — Encounter: Payer: Self-pay | Admitting: Physician Assistant

## 2019-02-16 ENCOUNTER — Ambulatory Visit (INDEPENDENT_AMBULATORY_CARE_PROVIDER_SITE_OTHER): Payer: BC Managed Care – PPO

## 2019-02-16 ENCOUNTER — Other Ambulatory Visit: Payer: Self-pay

## 2019-02-16 DIAGNOSIS — Z1231 Encounter for screening mammogram for malignant neoplasm of breast: Secondary | ICD-10-CM

## 2019-02-16 IMAGING — MG DIGITAL SCREENING BILAT W/ TOMO W/ CAD
6 of 10 series · 6 of 30 positions shown · non-contrast
Comparison: Previous exam(s).

CLINICAL DATA: Screening.

EXAM:
DIGITAL SCREENING BILATERAL MAMMOGRAM WITH TOMO AND CAD

[R MLO synth-2D (1 of 2)]
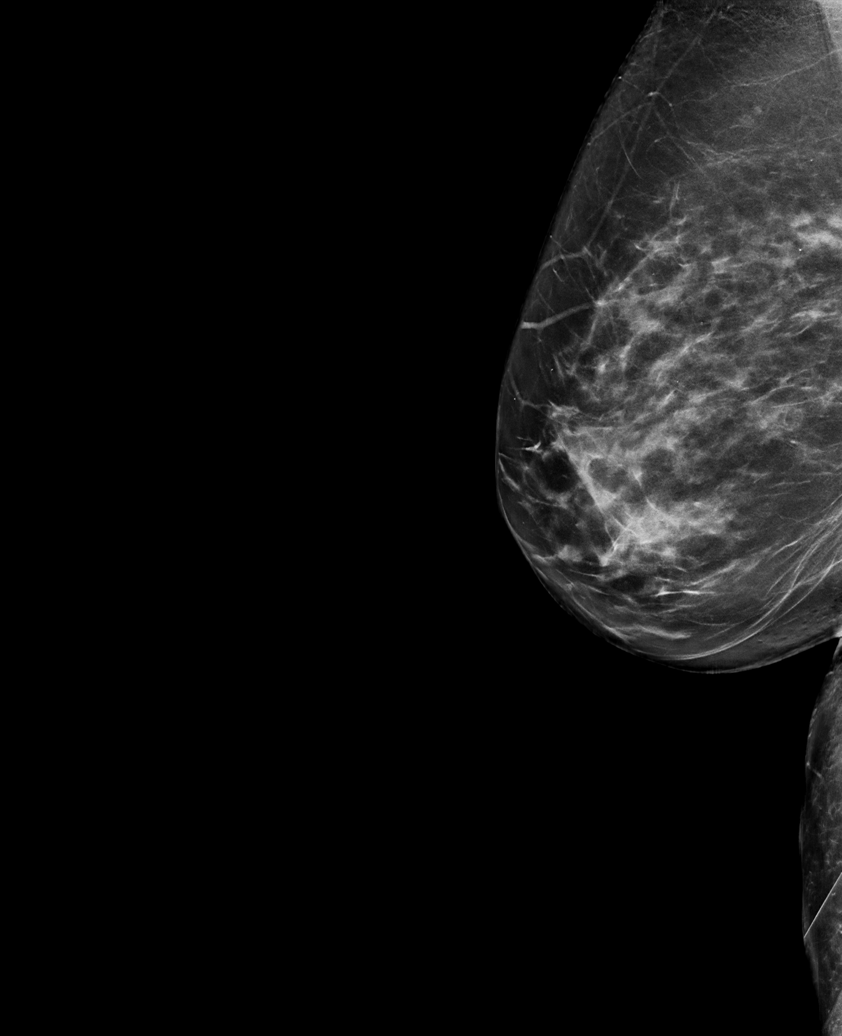

[L MLO synth-2D]
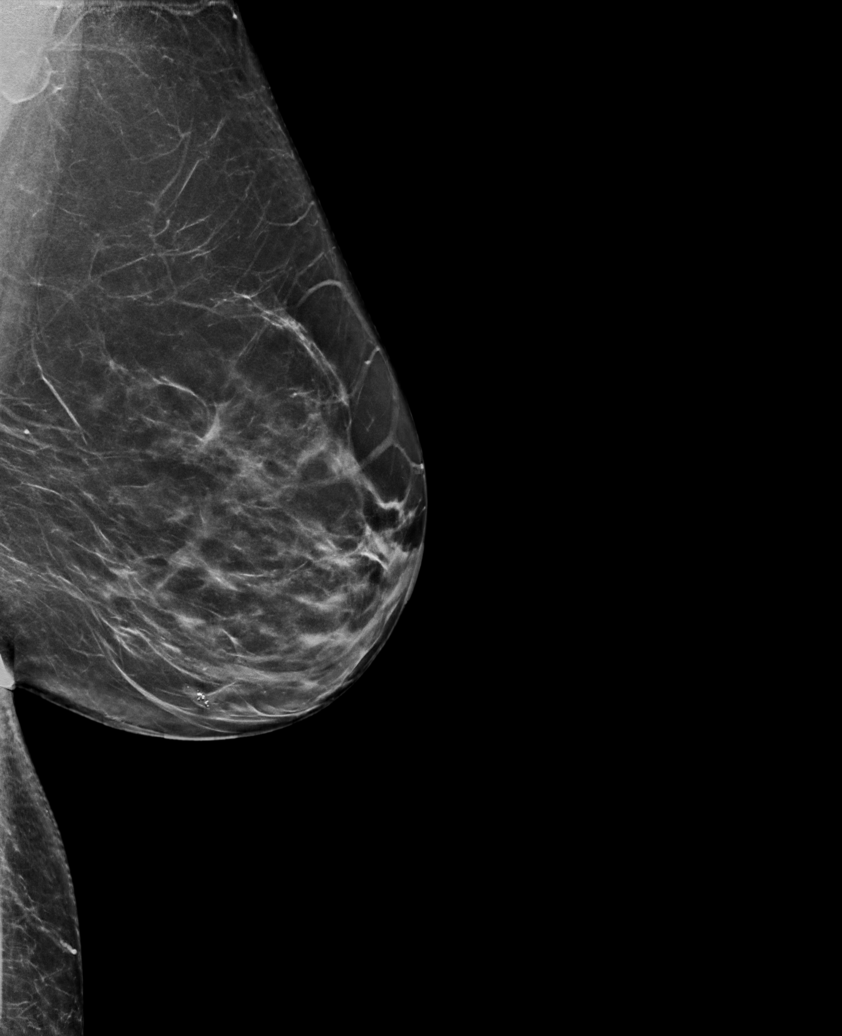

[R MLO synth-2D (2 of 2)]
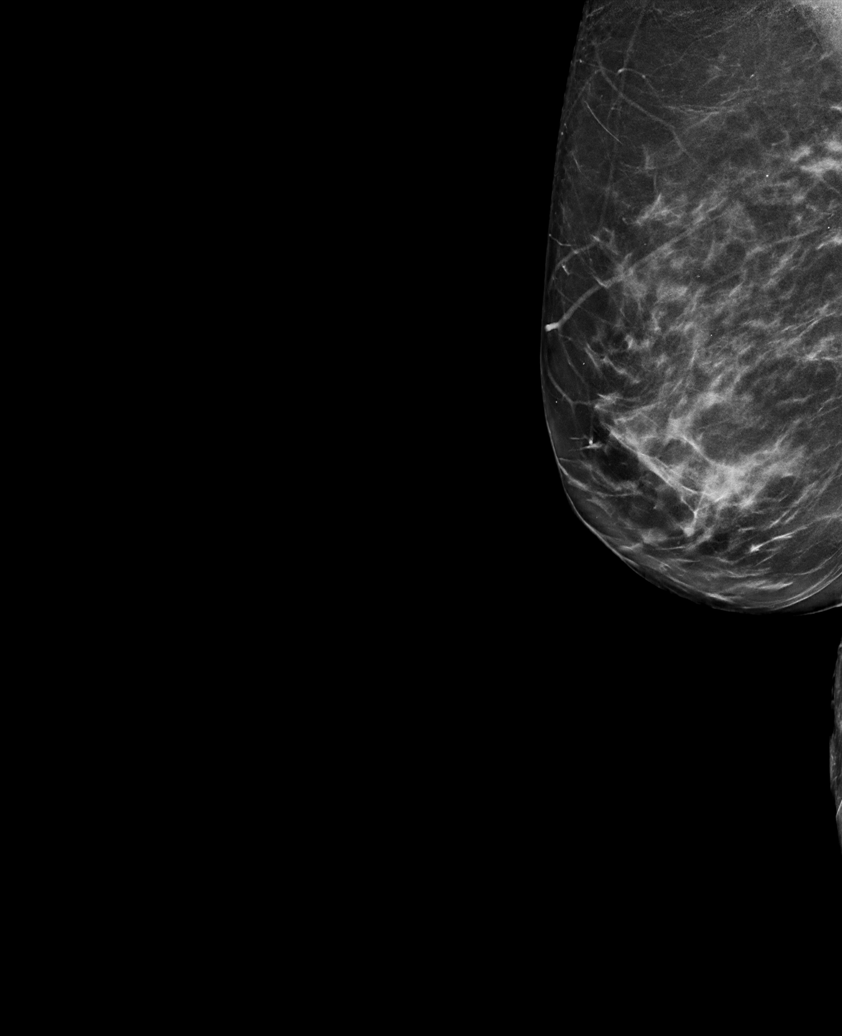

[L CC synth-2D]
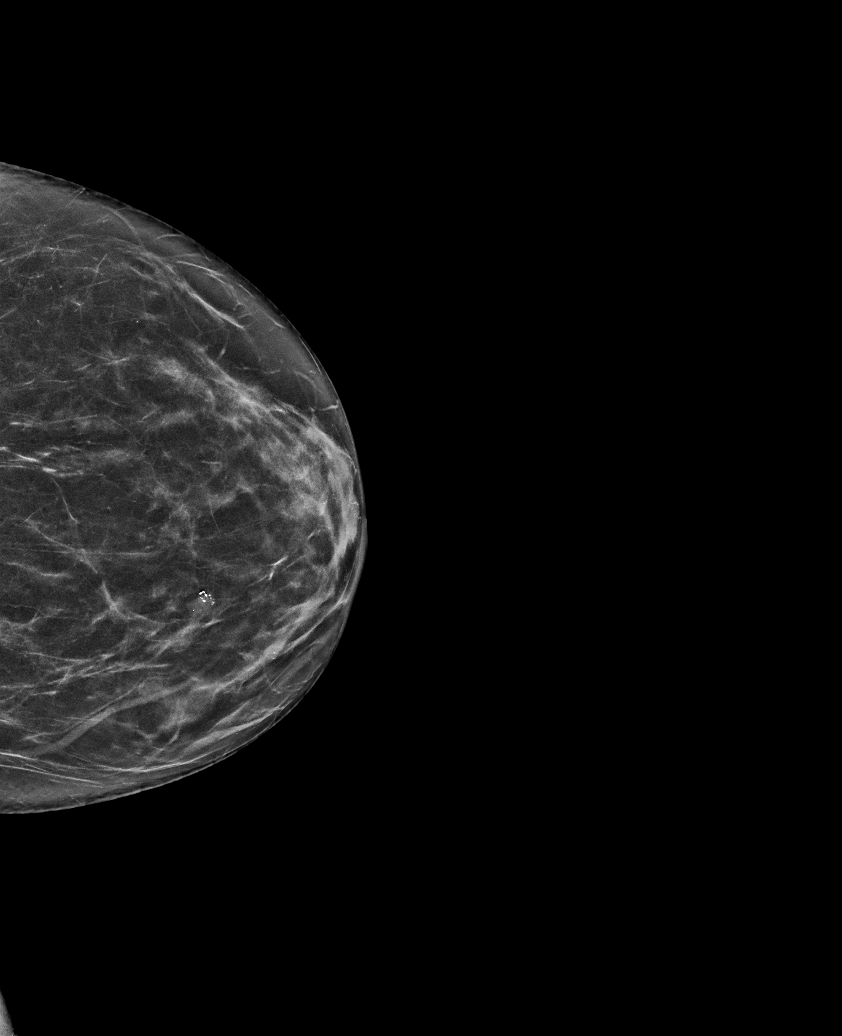

[R CC synth-2D]
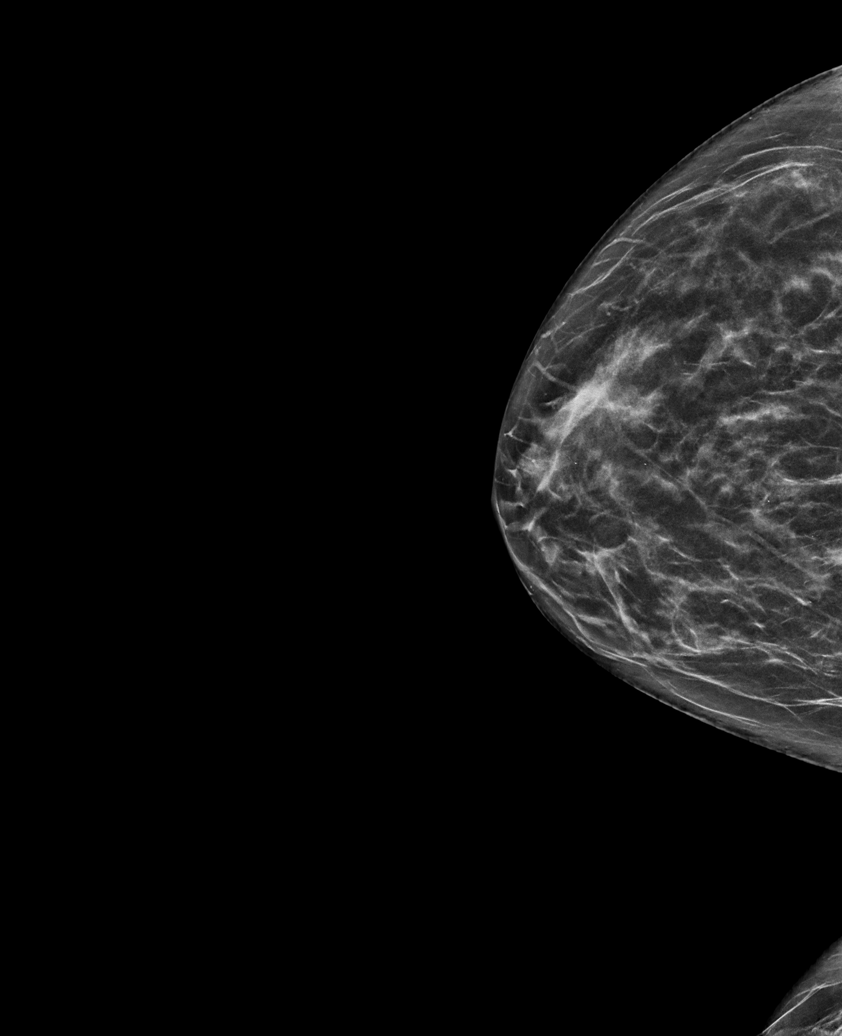

[R MLO tomo · tomo slice 39/76.0]
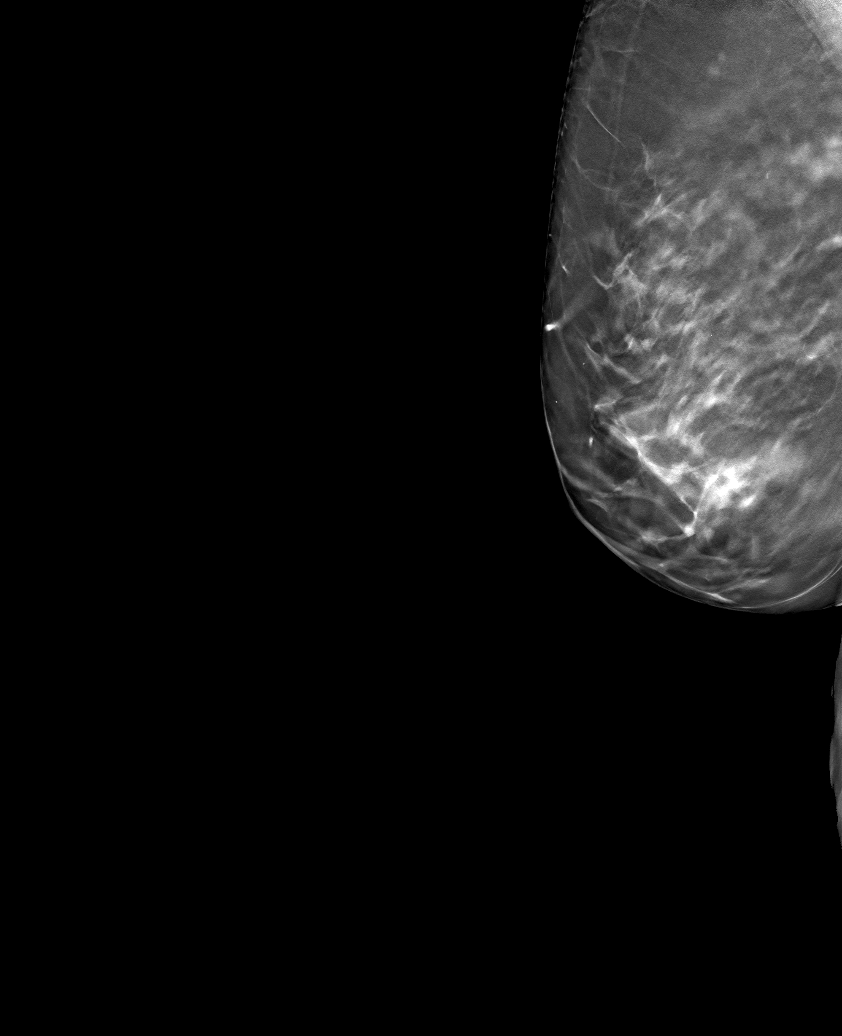

[6 of 30 positions shown; findings below may reference images not displayed]

ACR Breast Density Category c: The breast tissue is heterogeneously
dense, which may obscure small masses.
FINDINGS: There are no findings suspicious for malignancy. Images were
processed with CAD.
IMPRESSION: No mammographic evidence of malignancy. A result letter of this
screening mammogram will be mailed directly to the patient.

RECOMMENDATION:
Screening mammogram in one year. (Code:FT-U-LHB)

BI-RADS CATEGORY  1: Negative.

## 2019-02-16 NOTE — Progress Notes (Signed)
Normal mammogram. Follow up in 1 year.

## 2019-02-22 ENCOUNTER — Ambulatory Visit (INDEPENDENT_AMBULATORY_CARE_PROVIDER_SITE_OTHER): Payer: BC Managed Care – PPO | Admitting: Physical Therapy

## 2019-02-22 ENCOUNTER — Encounter: Payer: Self-pay | Admitting: Physical Therapy

## 2019-02-22 ENCOUNTER — Other Ambulatory Visit: Payer: Self-pay

## 2019-02-22 DIAGNOSIS — M545 Low back pain, unspecified: Secondary | ICD-10-CM

## 2019-02-22 DIAGNOSIS — M6281 Muscle weakness (generalized): Secondary | ICD-10-CM | POA: Diagnosis not present

## 2019-02-22 DIAGNOSIS — R29898 Other symptoms and signs involving the musculoskeletal system: Secondary | ICD-10-CM

## 2019-02-22 NOTE — Therapy (Signed)
[facility] [place], [place], [address] Phone: [phone]   Fax:  [phone]  Physical Therapy Treatment  Patient Details  Name: Leslie Roberson MRN: [id number] Date of Birth: [birth year] Referring Provider (PT): Zonia Kief, MD   Encounter Date: 02/22/2019  PT End of Session - 02/22/19 1432    Visit Number  10    Number of Visits  12    Date for PT Re-Evaluation  02/15/19    PT Start Time  1694    PT Stop Time  1515    PT Time Calculation (min)  43 min    Activity Tolerance  Patient tolerated treatment well    Behavior During Therapy  [facility] for tasks assessed/performed       Past Medical History:  Diagnosis Date  . Anxiety   . Arthritis    "right little finger; base of thumb left hand; spine" (07/07/2017)  . Asthma, chronic 11/27/2015  . Chronic back pain   . Coronary artery disease    4/19 PCI/DEx2 to LAD, and PCI/DES x1 to RCA, normal EF  . Depression   . Diverticulitis of colon 12/21/2015   Colonoscopy every 5 years/digestive health.   . GERD (gastroesophageal reflux disease) 11/27/2015  . History of blood transfusion    "related to spinal fusions"  . Hypothyroidism   . Idiopathic scoliosis 01/22/2016  . Osteoporosis 11/27/2015   On prolia.   . Pneumonia 2016  . Pneumothorax 06/26/2017  . Pre-diabetes   . Thyroid disease     Past Surgical History:  Procedure Laterality Date  . ABDOMINAL HYSTERECTOMY    . bladder mesh     S/P bladder sling"  . CORONARY STENT INTERVENTION N/A 07/07/2017   Procedure: CORONARY STENT INTERVENTION;  Surgeon: Leonie Man, MD;  Location: [facility];  Service: Cardiovascular;  Laterality: N/A;  . ELBOW SURGERY Right    "dislocation"  . INCONTINENCE SURGERY     "didn't work"  . INTRAVASCULAR PRESSURE WIRE/FFR STUDY N/A 07/07/2017   Procedure: INTRAVASCULAR PRESSURE WIRE/FFR STUDY;  Surgeon: Leonie Man, MD;  Location: [facility];   Service: Cardiovascular;  Laterality: N/A;  . JOINT REPLACEMENT    . LEFT HEART CATH AND CORONARY ANGIOGRAPHY N/A 07/07/2017   Procedure: LEFT HEART CATH AND CORONARY ANGIOGRAPHY;  Surgeon: Leonie Man, MD;  Location: [facility];  Service: Cardiovascular;  Laterality: N/A;  . SPINAL FUSION  ~ 1970 X 3   "below neck - lower back"  . TONSILLECTOMY    . TOTAL HIP ARTHROPLASTY Left     There were no vitals filed for this visit.  Subjective Assessment - 02/22/19 1435    Subjective  Patient states she feels pretty good today. She still has some difficulty getting in/out of the car depending on the side. It is better but still painful.    Pertinent History  Osteopenia, scoliosis, CAD    Patient Stated Goals  move comfortably, be able to lift Rt leg    Currently in Pain?  Yes    Pain Score  6     Pain Location  Back    Pain Orientation  Right    Pain Descriptors / Indicators  Aching    Pain Type  Chronic pain                       [facility] Adult PT Treatment/Exercise - 02/22/19 0001  Lumbar Exercises: Stretches   Lower Trunk Rotation  1 rep;60 seconds    Lower Trunk Rotation Limitations  to the left with left foot giving overpressure    Hip Flexor Stretch  Right;2 reps;60 seconds    Hip Flexor Stretch Limitations  seated    Piriformis Stretch  Right;Left;60 seconds   modified table stretch   Other Lumbar Stretch Exercise  lateral stretch to low back 2x30 sec      Lumbar Exercises: Aerobic   Nustep  L5 x 6 min      Lumbar Exercises: Standing   Row  Strengthening;Both;20 reps;Theraband   3 sec hold   Theraband Level (Row)  Level 3 (Green)      Lumbar Exercises: Supine   Pelvic Tilt  10 reps    Clam  10 reps   each side   Clam Limitations  with core engaged    Bridge  20 reps;3 seconds      Lumbar Exercises: Sidelying   Clam  Both;10 reps    Clam Limitations  green band             PT Education - 02/22/19 1512    Education Details   progressed final HEP    Person(s) Educated  Patient    Methods  Explanation;Demonstration;Handout    Comprehension  Verbalized understanding;Returned demonstration          PT Long Term Goals - 02/22/19 1442      PT LONG TERM GOAL #1   Title  independent with HEP    Status  Achieved      PT LONG TERM GOAL #2   Title  improve FOTO score to </=51% limited for improved function    Baseline  53% limited; had met goal previous visit, but d/c FOTO was higher.    Status  Partially Met      PT LONG TERM GOAL #3   Title  report pain < 4/10 with activity for improved function    Baseline  6.5/10 on average    Status  Not Met      PT LONG TERM GOAL #4   Title  demonstrate at least 4/5 bil hip abduction for improved function    Status  Achieved            Plan - 02/22/19 1536    Clinical Impression Statement  Patient presents today with continued c/o right low back pain. She has met or partially met her LTG's except for her pain goal. She is scheduled for an ablation 03/08/19. HEP was progressed and finalized for discharge today.    Comorbidities  osteoporosis, arthritis, scoliosis, anxiety, depression, CAD, asthma, multiple orthopedic surgeries    PT Frequency  2x / week    PT Duration  6 weeks    PT Treatment/Interventions  ADLs/Self Care Home Management;Cryotherapy;Electrical Stimulation;Moist Heat;Ultrasound;Therapeutic exercise;Therapeutic activities;Functional mobility training;Iontophoresis 90m/ml Dexamethasone;Neuromuscular re-education;Patient/family education;Manual techniques;Dry needling;Taping    PT Next Visit Plan  see d/c summary    PT Home Exercise Plan  Access Code: [email or web address]    Consulted and Agree with Plan of Care  Patient       Patient will benefit from skilled therapeutic intervention in order to improve the following deficits and impairments:  Decreased range of motion, Increased fascial restricitons, Increased muscle spasms, Pain, Postural dysfunction,  Decreased strength, Impaired flexibility  Visit Diagnosis: Acute right-sided low back pain without sciatica  Other symptoms and signs involving the musculoskeletal system  Muscle weakness (generalized)  Problem List Patient Active Problem List   Diagnosis Date Noted  . Migraine without aura and without status migrainosus, not intractable 02/08/2019  . OAB (overactive bladder) 02/08/2019  . Diabetes mellitus without complication ([place]) 60/60/0459  . Hyperlipidemia LDL goal <70 11/29/2018  . DJD (degenerative joint disease), lumbosacral 11/29/2018  . Pre-operative clearance 11/29/2018  . Nasal dryness 02/06/2018  . Arthralgia 02/05/2018  . Rhinorrhea 01/04/2018  . Excessive sweating 09/22/2017  . Tongue mass 08/14/2017  . Angina, class III ([place]) 07/07/2017  . CAD S/P percutaneous coronary angioplasty   . Pneumothorax 06/25/2017  . Atypical chest pain 05/24/2017  . Itchy scalp 05/24/2017  . Right leg pain 02/06/2017  . Chronic tension-type headache, intractable 02/05/2017  . Prediabetes 02/05/2017  . Lump in throat 01/27/2017  . Dysphagia 01/27/2017  . No energy 09/15/2016  . Bad taste in mouth 07/26/2016  . Paresthesia 04/03/2016  . Facet hypertrophy of lumbosacral region 04/03/2016  . Chronic back pain 04/03/2016  . Right knee pain 02/19/2016  . Rosacea 02/04/2016  . Vaginal atrophy 01/31/2016  . Idiopathic scoliosis 01/22/2016  . Hip bursitis 12/25/2015  . Biceps tendonitis on right 12/25/2015  . Diverticulitis of colon 12/21/2015  . GERD (gastroesophageal reflux disease) 11/27/2015  . Osteoporosis 11/27/2015  . GAD (generalized anxiety disorder) 11/27/2015  . MDD (major depressive disorder), recurrent, in full remission ([place]) 11/27/2015  . Chronic dryness of both eyes 11/27/2015  . Acquired hypothyroidism 11/27/2015  . Asthma, chronic 11/27/2015  . Insomnia 11/27/2015  . Seborrheic keratoses 11/27/2015    Madelyn Flavors PT 02/22/2019, 3:38 PM  [facility] [place], [place], [address] Phone: [phone]   Fax:  [phone]  Name: Angles Trevizo MRN: [id number] Date of Birth: Aug 17, 1955  PHYSICAL THERAPY DISCHARGE SUMMARY  Visits from Start of Care: 10  Current functional level related to goals / functional outcomes: See above   Remaining deficits: See above   Education / Equipment: HEP  Plan: Patient agrees to discharge.  Patient goals were partially met. Patient is being discharged due to lack of progress.  ?????     Madelyn Flavors, PT 02/22/19 3:40 PM  [facility] [address] [place], [place] [address]  [phone] (office) [phone] (fax)

## 2019-02-22 NOTE — Patient Instructions (Signed)
Access Code: [email or web address]  URL: [email or web address]  Date: 02/22/2019  Prepared by: Madelyn Flavors   Exercises Hooklying Single Knee to Chest - 3 reps - 1 sets - 30 sec hold - 2x daily - 7x weekly Supine Posterior Pelvic Tilt - 10 reps - 1 sets - 5 sec hold - 2x daily - 7x weekly Bent Knee Fallouts - 10 reps - 1-2 sets - 2x daily - 7x weekly Supine Bridge - 10 reps - 1 sets - 5 sec hold - 2x daily - 7x weekly Clamshell - 10 reps - 3 sets - 1x daily - 7x weekly Left Standing Lateral Shift Correction at [place] - 3 reps - 1 sets - 30 sec hold - 2x daily - 7x weekly Seated Hip Flexor Stretch - 3 reps - 1 sets - 30-60 sec hold - 2x daily - 7x weekly Seated Table Piriformis Stretch - 3 reps - 1 sets - 30 sec hold - 2x daily - 7x weekly Supine Quadratus Lumborum Stretch - 3 reps - 1 sets - 60 sec hold - 2x daily - 7x weekly

## 2019-02-23 ENCOUNTER — Encounter: Payer: Self-pay | Admitting: Physician Assistant

## 2019-02-23 MED ORDER — TAMSULOSIN HCL 0.4 MG PO CAPS: 0.4000 mg | ORAL_CAPSULE | Freq: Every day | ORAL | 0 refills | Status: DC

## 2019-02-23 NOTE — Telephone Encounter (Signed)
Double book 1:00 clock for virtual?

## 2019-03-01 DIAGNOSIS — F333 Major depressive disorder, recurrent, severe with psychotic symptoms: Secondary | ICD-10-CM | POA: Diagnosis not present

## 2019-03-09 ENCOUNTER — Encounter: Payer: Self-pay | Admitting: Physician Assistant

## 2019-03-14 DIAGNOSIS — E119 Type 2 diabetes mellitus without complications: Secondary | ICD-10-CM | POA: Diagnosis not present

## 2019-03-14 DIAGNOSIS — H2513 Age-related nuclear cataract, bilateral: Secondary | ICD-10-CM | POA: Diagnosis not present

## 2019-03-16 ENCOUNTER — Encounter: Payer: Self-pay | Admitting: Physician Assistant

## 2019-03-17 ENCOUNTER — Other Ambulatory Visit: Payer: Self-pay | Admitting: Physician Assistant

## 2019-03-17 DIAGNOSIS — N200 Calculus of kidney: Secondary | ICD-10-CM | POA: Diagnosis not present

## 2019-03-22 ENCOUNTER — Telehealth: Payer: Self-pay

## 2019-03-22 NOTE — Telephone Encounter (Signed)
Leslie Roberson is wanting to schedule her Prolia injection. She last had a CMP at the beginning of November. Do we need to get labs or will the calcium in November work?

## 2019-03-23 NOTE — Telephone Encounter (Signed)
It has to be within 30days of shot.

## 2019-03-23 NOTE — Telephone Encounter (Signed)
Tried to call patient, no answer, no voicemail.

## 2019-03-29 ENCOUNTER — Other Ambulatory Visit: Payer: Self-pay | Admitting: Physician Assistant

## 2019-03-29 DIAGNOSIS — M818 Other osteoporosis without current pathological fracture: Secondary | ICD-10-CM

## 2019-03-30 ENCOUNTER — Other Ambulatory Visit: Payer: Self-pay

## 2019-03-30 ENCOUNTER — Ambulatory Visit (INDEPENDENT_AMBULATORY_CARE_PROVIDER_SITE_OTHER): Payer: BC Managed Care – PPO | Admitting: Physician Assistant

## 2019-03-30 VITALS — BP 113/67 | HR 81 | Temp 98.4°F | Wt 128.0 lb

## 2019-03-30 DIAGNOSIS — M818 Other osteoporosis without current pathological fracture: Secondary | ICD-10-CM | POA: Diagnosis not present

## 2019-03-30 LAB — COMPLETE METABOLIC PANEL WITH GFR
AG Ratio: 2 (calc) (ref 1.0–2.5)
ALT: 37 U/L — ABNORMAL HIGH (ref 6–29)
AST: 27 U/L (ref 10–35)
Albumin: 4.5 g/dL (ref 3.6–5.1)
Alkaline phosphatase (APISO): 59 U/L (ref 37–153)
BUN: 18 mg/dL (ref 7–25)
CO2: 27 mmol/L (ref 20–32)
Calcium: 10.3 mg/dL (ref 8.6–10.4)
Chloride: 105 mmol/L (ref 98–110)
Creat: 0.93 mg/dL (ref 0.50–0.99)
GFR, Est African American: 76 mL/min/{1.73_m2} (ref >=60)
GFR, Est Non African American: 65 mL/min/{1.73_m2} (ref >=60)
Globulin: 2.3 g/dL (calc) (ref 1.9–3.7)
Glucose, Bld: 54 mg/dL — ABNORMAL LOW (ref 65–99)
Potassium: 4.2 mmol/L (ref 3.5–5.3)
Sodium: 143 mmol/L (ref 135–146)
Total Bilirubin: 0.3 mg/dL (ref 0.2–1.2)
Total Protein: 6.8 g/dL (ref 6.1–8.1)

## 2019-03-30 MED ORDER — DENOSUMAB 60 MG/ML ~~LOC~~ SOSY
60.0000 mg | PREFILLED_SYRINGE | Freq: Once | SUBCUTANEOUS | Status: AC
  Administered 2019-03-30: 15:00:00 60 mg via SUBCUTANEOUS

## 2019-03-30 NOTE — Progress Notes (Signed)
Pt in for prolia injection. Pt tolerated injection well.

## 2019-03-30 NOTE — Progress Notes (Signed)
Patient ID: Leslie Roberson, female   DOB: [birth year], 63 y.o.   MRN: [id number] Agree with plan.

## 2019-03-30 NOTE — Progress Notes (Signed)
Ok for prolia shot. Kidney function looks great. It has improved.   Your glucose is pretty low at 54 this could cause hypoglycemic(low blood sugar symptoms) such as fatigue, clammy, confusion, weakness, shaking etc. Make sure eating regular meals and healthy snacks.

## 2019-03-30 NOTE — Telephone Encounter (Signed)
Patient scheduled. Labs normal.

## 2019-04-05 DIAGNOSIS — M7918 Myalgia, other site: Secondary | ICD-10-CM | POA: Diagnosis not present

## 2019-04-05 DIAGNOSIS — M5136 Other intervertebral disc degeneration, lumbar region: Secondary | ICD-10-CM | POA: Diagnosis not present

## 2019-04-05 DIAGNOSIS — M47816 Spondylosis without myelopathy or radiculopathy, lumbar region: Secondary | ICD-10-CM | POA: Diagnosis not present

## 2019-04-14 ENCOUNTER — Telehealth: Payer: Self-pay | Admitting: Neurology

## 2019-04-14 NOTE — Telephone Encounter (Signed)
[facility] called stating patient's home sleep test needs peer-to-peer before it can be authorized. She states she called them and it had to be the physician. Phone number is [phone]. (she did want [place] to know she had to wait two hours on the phone).

## 2019-04-26 ENCOUNTER — Encounter: Payer: Self-pay | Admitting: Physician Assistant

## 2019-04-26 NOTE — Telephone Encounter (Signed)
Lets try [place] [employer] [facility] sleep study and see if we can get.

## 2019-04-27 NOTE — Telephone Encounter (Signed)
Sent order to [facility] for home sleep study to [phone] with confirmation received.

## 2019-04-28 ENCOUNTER — Encounter: Payer: Self-pay | Admitting: Physician Assistant

## 2019-04-29 ENCOUNTER — Telehealth (INDEPENDENT_AMBULATORY_CARE_PROVIDER_SITE_OTHER): Payer: BC Managed Care – PPO | Admitting: Physician Assistant

## 2019-04-29 VITALS — BP 128/77 | Temp 98.7°F | Ht <= 58 in | Wt 128.0 lb

## 2019-04-29 DIAGNOSIS — G478 Other sleep disorders: Secondary | ICD-10-CM

## 2019-04-29 DIAGNOSIS — T887XXA Unspecified adverse effect of drug or medicament, initial encounter: Secondary | ICD-10-CM | POA: Diagnosis not present

## 2019-04-29 DIAGNOSIS — E119 Type 2 diabetes mellitus without complications: Secondary | ICD-10-CM | POA: Diagnosis not present

## 2019-04-29 DIAGNOSIS — R0683 Snoring: Secondary | ICD-10-CM | POA: Diagnosis not present

## 2019-04-29 MED ORDER — METFORMIN HCL ER 500 MG PO TB24: 500.0000 mg | ORAL_TABLET | Freq: Every day | ORAL | 1 refills | Status: DC

## 2019-04-29 NOTE — Progress Notes (Signed)
Wants to discuss sleep study before she completes.  She did here from [facility] about getting home study sent to her. Also wanted to know about repeating lab work before refilling Metformin.   She is checking blood pressure and temperature and will give numbers to [facility].

## 2019-05-01 ENCOUNTER — Other Ambulatory Visit: Payer: Self-pay | Admitting: Physician Assistant

## 2019-05-01 DIAGNOSIS — E119 Type 2 diabetes mellitus without complications: Secondary | ICD-10-CM

## 2019-05-02 ENCOUNTER — Encounter: Payer: Self-pay | Admitting: Physician Assistant

## 2019-05-02 DIAGNOSIS — R0683 Snoring: Secondary | ICD-10-CM | POA: Insufficient documentation

## 2019-05-02 DIAGNOSIS — G478 Other sleep disorders: Secondary | ICD-10-CM | POA: Insufficient documentation

## 2019-05-02 NOTE — Progress Notes (Signed)
Patient ID: Leslie Roberson, female   DOB: [birth year], 64 y.o.   MRN: [id number] .Marland KitchenVirtual Visit via Video Note  I connected with Leslie Roberson on 04/29/2019 at  1:00 PM EST by a video enabled telemedicine application and verified that I am speaking with the correct person using two identifiers.  Location: Patient: home Provider: clinic   I discussed the limitations of evaluation and management by telemedicine and the availability of in person appointments. The patient expressed understanding and agreed to proceed.  History of Present Illness: Pt is a 64 yo female who presents to the clinic to discuss getting labs before refilling metformin.  She is having some GI side effects with metformin and does not always take twice a day. She is not having any other issues. She is not checking her sugars. She is trying to make diet changes. She is not exercising.   .. Lab Results  Component Value Date   HGBA1C 6.5 (H) 02/03/2019   She did get SNAP info. She continues to snore and have non-restorative sleep.    Active Ambulatory Problems    Diagnosis Date Noted  . GERD (gastroesophageal reflux disease) 11/27/2015  . Osteoporosis 11/27/2015  . GAD (generalized anxiety disorder) 11/27/2015  . MDD (major depressive disorder), recurrent, in full remission ([place]) 11/27/2015  . Chronic dryness of both eyes 11/27/2015  . Acquired hypothyroidism 11/27/2015  . Asthma, chronic 11/27/2015  . Insomnia 11/27/2015  . Seborrheic keratoses 11/27/2015  . Diverticulitis of colon 12/21/2015  . Hip bursitis 12/25/2015  . Biceps tendonitis on right 12/25/2015  . Idiopathic scoliosis 01/22/2016  . Vaginal atrophy 01/31/2016  . Rosacea 02/04/2016  . Right knee pain 02/19/2016  . Paresthesia 04/03/2016  . Facet hypertrophy of lumbosacral region 04/03/2016  . Chronic back pain 04/03/2016  . Bad taste in mouth 07/26/2016  . No energy 09/15/2016  . Lump in throat 01/27/2017  . Dysphagia 01/27/2017  .  Chronic tension-type headache, intractable 02/05/2017  . Prediabetes 02/05/2017  . Right leg pain 02/06/2017  . Atypical chest pain 05/24/2017  . Itchy scalp 05/24/2017  . Pneumothorax 06/25/2017  . Angina, class III ([place]) 07/07/2017  . CAD S/P percutaneous coronary angioplasty   . Tongue mass 08/14/2017  . Excessive sweating 09/22/2017  . Rhinorrhea 01/04/2018  . Arthralgia 02/05/2018  . Nasal dryness 02/06/2018  . Hyperlipidemia LDL goal <70 11/29/2018  . DJD (degenerative joint disease), lumbosacral 11/29/2018  . Pre-operative clearance 11/29/2018  . Diabetes mellitus without complication ([place]) [id number]  . Migraine without aura and without status migrainosus, not intractable 02/08/2019  . OAB (overactive bladder) 02/08/2019  . Snoring 05/02/2019  . Non-restorative sleep 05/02/2019   Resolved Ambulatory Problems    Diagnosis Date Noted  . Acute medial meniscus tear 04/23/2016  . Left knee pain 04/28/2016  . Mild intermittent asthma without complication [id number]   Past Medical History:  Diagnosis Date  . Anxiety   . Arthritis   . Coronary artery disease   . Depression   . History of blood transfusion   . Hypothyroidism   . Pneumonia 2016  . Pre-diabetes   . Thyroid disease         Observations/Objective: No acute distress.  Normal mood and appearance.  Normal breathing.   .. Today's Vitals   04/29/19 1132  BP: 128/77  Temp: 98.7 F (37.1 C)  Weight: 128 lb (58.1 kg)  Height: 4\' 10"  (1.473 m)   Body mass index is 26.75 kg/m.    Assessment and Plan: Marland KitchenMarland Kitchen  Diagnoses and all orders for this visit:  Diabetes mellitus without complication ([place]) -     Hemoglobin A1c -     metFORMIN (GLUCOPHAGE XR) 500 MG 24 hr tablet; Take 1 tablet (500 mg total) by mouth daily with breakfast.  Non-restorative sleep  Snoring  Medication side effect  encouraged patient to do home sleep study for her apnea symptoms.   Ordered A1C. Will try extended release  metformin to see if easier on GI system. Continue to watch sugars and carbs. Get yearly eye exam. On statin. BP to goal.      Follow Up Instructions:    I discussed the assessment and treatment plan with the patient. The patient was provided an opportunity to ask questions and all were answered. The patient agreed with the plan and demonstrated an understanding of the instructions.   The patient was advised to call back or seek an in-person evaluation if the symptoms worsen or if the condition fails to improve as anticipated.   Iran Planas, PA-C

## 2019-05-06 DIAGNOSIS — E119 Type 2 diabetes mellitus without complications: Secondary | ICD-10-CM | POA: Diagnosis not present

## 2019-05-07 LAB — HEMOGLOBIN A1C
Hgb A1c MFr Bld: 6.1 % of total Hgb — ABNORMAL HIGH (ref <5.7)
Mean Plasma Glucose: 128 (calc)
eAG (mmol/L): 7.1 (calc)

## 2019-05-09 ENCOUNTER — Encounter: Payer: Self-pay | Admitting: Physician Assistant

## 2019-05-09 DIAGNOSIS — G4733 Obstructive sleep apnea (adult) (pediatric): Secondary | ICD-10-CM

## 2019-05-09 NOTE — Progress Notes (Signed)
Leslie Roberson,   A1c is better than 3 months ago. Metformin is helping. Along with lowering sugars and carbs and regular exercise you can keep a1C under 6.5.

## 2019-05-10 ENCOUNTER — Ambulatory Visit: Payer: BC Managed Care – PPO | Admitting: Physician Assistant

## 2019-05-15 ENCOUNTER — Other Ambulatory Visit: Payer: Self-pay | Admitting: Physician Assistant

## 2019-05-15 DIAGNOSIS — K2100 Gastro-esophageal reflux disease with esophagitis, without bleeding: Secondary | ICD-10-CM

## 2019-05-20 DIAGNOSIS — Z9989 Dependence on other enabling machines and devices: Secondary | ICD-10-CM | POA: Insufficient documentation

## 2019-05-20 DIAGNOSIS — G4733 Obstructive sleep apnea (adult) (pediatric): Secondary | ICD-10-CM | POA: Insufficient documentation

## 2019-05-20 NOTE — Telephone Encounter (Signed)
Please call patient and let her know she has moderate sleep apnea. It is suggested that she use a CPAP at night to help with this. We will send order to get this machine approved and out to use. Follow up after using for the next 4 weeks.   Leslie Roberson, pap pressure should be at 5cm H20 ok to send to [place] at [employer].

## 2019-05-20 NOTE — Telephone Encounter (Signed)
CPAP order sent to [facility] at [phone] with confirmation received. They will contact patient directly. Mychart message sent to patient.

## 2019-05-22 ENCOUNTER — Other Ambulatory Visit: Payer: Self-pay | Admitting: Cardiology

## 2019-05-23 DIAGNOSIS — E119 Type 2 diabetes mellitus without complications: Secondary | ICD-10-CM | POA: Diagnosis not present

## 2019-05-23 DIAGNOSIS — Z79899 Other long term (current) drug therapy: Secondary | ICD-10-CM | POA: Diagnosis not present

## 2019-05-30 DIAGNOSIS — F333 Major depressive disorder, recurrent, severe with psychotic symptoms: Secondary | ICD-10-CM | POA: Diagnosis not present

## 2019-05-30 NOTE — Progress Notes (Signed)
HPI: FU CAD.  I initially saw patient in March 2019 with chest pain.  We scheduled a cardiac catheterization but patient subsequently presented with pneumothorax. She ultimately underwent cardiac catheterization April 2019 and was found to have a 99% mid to distal right coronary artery, 75% proximal LAD followed by 60% lesion and normal LV function. The patient had PCI of both lesions with drug-eluting stents. Since last seen, the patient denies any dyspnea on exertion, orthopnea, PND, pedal edema, palpitations, syncope or chest pain.   Current Outpatient Medications  Medication Sig Dispense Refill  . albuterol (PROVENTIL HFA;VENTOLIN HFA) 108 (90 Base) MCG/ACT inhaler Inhale 1-2 puffs into the lungs every 4 (four) hours as needed for wheezing or shortness of breath. 1 Inhaler 3  . aspirin EC 81 MG tablet Take 81 mg by mouth at bedtime.    Marland Kitchen atorvastatin (LIPITOR) 20 MG tablet Take 1 tablet (20 mg total) by mouth daily. 90 tablet 3  . busPIRone (BUSPAR) 15 MG tablet Take 1 tablet (15 mg total) by mouth daily. 30 tablet 0  . calcium-vitamin D (OSCAL WITH D) 500-200 MG-UNIT tablet Take 1 tablet by mouth daily.    . clonazePAM (KLONOPIN) 0.5 MG tablet TAKE 1/2 TABLET BY MOUTH TWICE A DAY AS NEEDED FOR ANXIETY    . denosumab (PROLIA) 60 MG/ML SOLN injection Inject 60 mg into the skin every 6 (six) months. Administer in upper arm, thigh, or abdomen    . desvenlafaxine (PRISTIQ) 100 MG 24 hr tablet Take 100 mg by mouth at bedtime 30 tablet 0  . diclofenac sodium (VOLTAREN) 1 % GEL APPLY 4G TOPICALLY 4 TIMES DAILY 100 g 1  . gabapentin (NEURONTIN) 300 MG capsule TAKE 1 CAP AT BEDTIME X1 WEEK,1 CAP TWICE DAILY X1 WEEK THEN 1 CAP 3X DAILY.MAY DOUBLE WEEKLY=3600MG  (Patient taking differently: Patient take 1 in the am and 2 at bedtime) 540 capsule 2  . levothyroxine (SYNTHROID) 137 MCG tablet TAKE 1/2 TABLET DAILY 5 DAYS A WEEK AND 1 TABLET DAILY 2 DAYS A WEEK. 180 tablet 3  . lidocaine (LIDODERM) 5 %  Place 1 patch onto the skin daily. Remove & Discard patch within 12 hours or as directed by MD 30 patch 12  . Menthol, Topical Analgesic, (MINERAL ICE EX) Apply 1 application to affected sites one to two times a day as needed (for pain)    . metFORMIN (GLUCOPHAGE XR) 500 MG 24 hr tablet Take 1 tablet (500 mg total) by mouth daily with breakfast. 90 tablet 1  . metoprolol succinate (TOPROL-XL) 25 MG 24 hr tablet Take 1 tablet (25 mg total) by mouth daily. Please keep upcoming appt for refills. Thank you 90 tablet 0  . MYRBETRIQ 50 MG TB24 tablet Take 1 tablet (50 mg total) by mouth daily. 90 tablet 3  . nitroGLYCERIN (NITROSTAT) 0.4 MG SL tablet PLACE 1 TABLET (0.4 MG TOTAL) UNDER THE TONGUE EVERY 5 (FIVE) MINUTES AS NEEDED. 25 tablet 2  . Omega-3 Fatty Acids (FISH OIL PO) Take 1 capsule by mouth daily.     . pantoprazole (PROTONIX) 40 MG tablet TAKE 1 TABLET BY MOUTH EVERY DAY 90 tablet 1  . REXULTI 1 MG TABS Take 1 tablet by mouth daily.     . tamsulosin (FLOMAX) 0.4 MG CAPS capsule Take 1 capsule (0.4 mg total) by mouth daily. For next 15-30 days. 30 capsule 0  . topiramate (TOPAMAX) 50 MG tablet Take 1 tablet (50 mg total) by mouth 2 (two)  times daily. 180 tablet 2   No current facility-administered medications for this visit.     Past Medical History:  Diagnosis Date  . Anxiety   . Arthritis    "right little finger; base of thumb left hand; spine" (07/07/2017)  . Asthma, chronic 11/27/2015  . Chronic back pain   . Coronary artery disease    4/19 PCI/DEx2 to LAD, and PCI/DES x1 to RCA, normal EF  . Depression   . Diverticulitis of colon 12/21/2015   Colonoscopy every 5 years/digestive health.   . GERD (gastroesophageal reflux disease) 11/27/2015  . History of blood transfusion    "related to spinal fusions"  . Hypothyroidism   . Idiopathic scoliosis 01/22/2016  . Osteoporosis 11/27/2015   On prolia.   . Pneumonia 2016  . Pneumothorax 06/26/2017  . Pre-diabetes   . Thyroid disease       Past Surgical History:  Procedure Laterality Date  . ABDOMINAL HYSTERECTOMY    . bladder mesh     S/P bladder sling"  . CORONARY STENT INTERVENTION N/A 07/07/2017   Procedure: CORONARY STENT INTERVENTION;  Surgeon: Leonie Man, MD;  Location: [facility];  Service: Cardiovascular;  Laterality: N/A;  . ELBOW SURGERY Right    "dislocation"  . INCONTINENCE SURGERY     "didn't work"  . INTRAVASCULAR PRESSURE WIRE/FFR STUDY N/A 07/07/2017   Procedure: INTRAVASCULAR PRESSURE WIRE/FFR STUDY;  Surgeon: Leonie Man, MD;  Location: [facility];  Service: Cardiovascular;  Laterality: N/A;  . JOINT REPLACEMENT    . LEFT HEART CATH AND CORONARY ANGIOGRAPHY N/A 07/07/2017   Procedure: LEFT HEART CATH AND CORONARY ANGIOGRAPHY;  Surgeon: Leonie Man, MD;  Location: [facility];  Service: Cardiovascular;  Laterality: N/A;  . SPINAL FUSION  ~ 1970 X 3   "below neck - lower back"  . TONSILLECTOMY    . TOTAL HIP ARTHROPLASTY Left     Social History   Socioeconomic History  . Marital status: Married    Spouse name: Not on file  . Number of children: 1  . Years of education: Not on file  . Highest education level: Not on file  Occupational History  . Not on file  Tobacco Use  . Smoking status: Never Smoker  . Smokeless tobacco: Never Used  Substance and Sexual Activity  . Alcohol use: Yes    Comment: 07/07/2017 "5-6 times/year; 1 drink at each occasion"  . Drug use: No  . Sexual activity: Yes  Other Topics Concern  . Not on file  Social History Narrative  . Not on file   Social Determinants of Health   Financial Resource Strain:   . Difficulty of Paying Living Expenses: Not on file  Food Insecurity:   . Worried About Charity fundraiser in the Last Year: Not on file  . Ran Out of Food in the Last Year: Not on file  Transportation Needs:   . Lack of Transportation (Medical): Not on file  . Lack of Transportation (Non-Medical): Not on file  Physical  Activity:   . Days of Exercise per Week: Not on file  . Minutes of Exercise per Session: Not on file  Stress:   . Feeling of Stress : Not on file  Social Connections:   . Frequency of Communication with Friends and Family: Not on file  . Frequency of Social Gatherings with Friends and Family: Not on file  . Attends Religious Services: Not on file  . Active  Member of Clubs or Organizations: Not on file  . Attends Archivist Meetings: Not on file  . Marital Status: Not on file  Intimate Partner Violence:   . Fear of Current or Ex-Partner: Not on file  . Emotionally Abused: Not on file  . Physically Abused: Not on file  . Sexually Abused: Not on file    Family History  Problem Relation Age of Onset  . Cancer Mother        lung  . Depression Mother   . Heart attack Father   . Hyperlipidemia Father   . Hypertension Father   . Diabetes Maternal Aunt   . Hyperlipidemia Paternal Aunt   . COPD Paternal Aunt     ROS: no fevers or chills, productive cough, hemoptysis, dysphasia, odynophagia, melena, hematochezia, dysuria, hematuria, rash, seizure activity, orthopnea, PND, pedal edema, claudication. Remaining systems are negative.  Physical Exam: Well-developed well-nourished in no acute distress.  Skin is warm and dry.  HEENT is normal.  Neck is supple.  Chest is clear to auscultation with normal expansion.  Cardiovascular exam is regular rate and rhythm.  Abdominal exam nontender or distended. No masses palpated. Extremities show no edema. neuro grossly intact  ECG-sinus rhythm at a rate of 71, nonspecific ST changes.  Personally reviewed  A/P  1 CAD-patient is status post PCI of LAD and RCA.  Continue aspirin and statin.  2 hyperlipidemia-continue statin.  Kirk Ruths, MD

## 2019-06-02 DIAGNOSIS — G4733 Obstructive sleep apnea (adult) (pediatric): Secondary | ICD-10-CM | POA: Diagnosis not present

## 2019-06-02 DIAGNOSIS — M47816 Spondylosis without myelopathy or radiculopathy, lumbar region: Secondary | ICD-10-CM | POA: Diagnosis not present

## 2019-06-02 DIAGNOSIS — M5136 Other intervertebral disc degeneration, lumbar region: Secondary | ICD-10-CM | POA: Diagnosis not present

## 2019-06-02 DIAGNOSIS — M791 Myalgia, unspecified site: Secondary | ICD-10-CM | POA: Diagnosis not present

## 2019-06-02 DIAGNOSIS — M7061 Trochanteric bursitis, right hip: Secondary | ICD-10-CM | POA: Diagnosis not present

## 2019-06-03 DIAGNOSIS — G4733 Obstructive sleep apnea (adult) (pediatric): Secondary | ICD-10-CM | POA: Diagnosis not present

## 2019-06-07 ENCOUNTER — Encounter: Payer: Self-pay | Admitting: Cardiology

## 2019-06-07 ENCOUNTER — Other Ambulatory Visit: Payer: Self-pay

## 2019-06-07 ENCOUNTER — Ambulatory Visit: Payer: BC Managed Care – PPO | Admitting: Cardiology

## 2019-06-07 VITALS — BP 124/66 | HR 71 | Temp 97.5°F | Ht <= 58 in | Wt 129.0 lb

## 2019-06-07 DIAGNOSIS — E785 Hyperlipidemia, unspecified: Secondary | ICD-10-CM | POA: Diagnosis not present

## 2019-06-07 DIAGNOSIS — I2510 Atherosclerotic heart disease of native coronary artery without angina pectoris: Secondary | ICD-10-CM | POA: Diagnosis not present

## 2019-06-07 DIAGNOSIS — Z9861 Coronary angioplasty status: Secondary | ICD-10-CM | POA: Diagnosis not present

## 2019-06-07 NOTE — Patient Instructions (Signed)

## 2019-06-15 ENCOUNTER — Encounter: Payer: Self-pay | Admitting: Physician Assistant

## 2019-06-15 DIAGNOSIS — R202 Paresthesia of skin: Secondary | ICD-10-CM

## 2019-06-16 ENCOUNTER — Ambulatory Visit: Payer: BC Managed Care – PPO | Attending: Internal Medicine

## 2019-06-16 DIAGNOSIS — Z23 Encounter for immunization: Secondary | ICD-10-CM

## 2019-06-16 MED ORDER — GABAPENTIN 300 MG PO CAPS: ORAL_CAPSULE | ORAL | 3 refills | Status: DC

## 2019-06-16 NOTE — Progress Notes (Signed)
   Covid-19 Vaccination Clinic  Name:  Erick Ocheltree    MRN: [id number] DOB: 04-22-1955  06/16/2019  Ms. Paulette was observed post Covid-19 immunization for 15 minutes without incident. She was provided with Vaccine Information Sheet and instruction to access the V-Safe system.   Ms. Kubina was instructed to call 911 with any severe reactions post vaccine: Marland Kitchen Difficulty breathing  . Swelling of face and throat  . A fast heartbeat  . A bad rash all over body  . Dizziness and weakness   Immunizations Administered    Name Date Dose VIS Date Route   Pfizer COVID-19 Vaccine 06/16/2019  2:39 PM 0.3 mL 03/11/2019 Intramuscular   Manufacturer: [place]   Lot: [id number]   [place]: [id number]

## 2019-06-23 ENCOUNTER — Telehealth: Payer: Self-pay | Admitting: Neurology

## 2019-06-23 NOTE — Telephone Encounter (Signed)
[facility] set up PAP therapy 06/03/2019 Equipment: AirSense 10 Auto For Her Mask: respironic dreamwear nasal Pressure setting: 5-15 Follow up needed 07/04/2019-09/01/2019  Notes to be faxed to: [phone]

## 2019-07-04 DIAGNOSIS — G4733 Obstructive sleep apnea (adult) (pediatric): Secondary | ICD-10-CM | POA: Diagnosis not present

## 2019-07-11 ENCOUNTER — Ambulatory Visit: Payer: BC Managed Care – PPO | Attending: Internal Medicine

## 2019-07-11 DIAGNOSIS — Z23 Encounter for immunization: Secondary | ICD-10-CM

## 2019-07-11 NOTE — Progress Notes (Signed)
   Covid-19 Vaccination Clinic  Name:  Nelli Larin    MRN: [id number] DOB: [birth year]  07/11/2019  Ms. Markuson was observed post Covid-19 immunization for 15 minutes without incident. She was provided with Vaccine Information Sheet and instruction to access the V-Safe system.   Ms. Loesch was instructed to call 911 with any severe reactions post vaccine: Marland Kitchen Difficulty breathing  . Swelling of face and throat  . A fast heartbeat  . A bad rash all over body  . Dizziness and weakness   Immunizations Administered    Name Date Dose VIS Date Route   Pfizer COVID-19 Vaccine 07/11/2019  3:06 PM 0.3 mL 03/11/2019 Intramuscular   Manufacturer: [place]   Lot: [id number]   [place]: [id number]

## 2019-07-18 ENCOUNTER — Other Ambulatory Visit: Payer: Self-pay

## 2019-07-18 ENCOUNTER — Encounter: Payer: Self-pay | Admitting: Physician Assistant

## 2019-07-18 ENCOUNTER — Ambulatory Visit (INDEPENDENT_AMBULATORY_CARE_PROVIDER_SITE_OTHER): Payer: BC Managed Care – PPO | Admitting: Physician Assistant

## 2019-07-18 VITALS — BP 98/60 | HR 70 | Ht <= 58 in | Wt 128.0 lb

## 2019-07-18 DIAGNOSIS — M25552 Pain in left hip: Secondary | ICD-10-CM | POA: Diagnosis not present

## 2019-07-18 DIAGNOSIS — Z96642 Presence of left artificial hip joint: Secondary | ICD-10-CM | POA: Diagnosis not present

## 2019-07-18 DIAGNOSIS — L600 Ingrowing nail: Secondary | ICD-10-CM | POA: Diagnosis not present

## 2019-07-18 DIAGNOSIS — G4733 Obstructive sleep apnea (adult) (pediatric): Secondary | ICD-10-CM

## 2019-07-18 DIAGNOSIS — Z9989 Dependence on other enabling machines and devices: Secondary | ICD-10-CM

## 2019-07-18 NOTE — Progress Notes (Signed)
Subjective:    Patient ID: Leslie Roberson, female    DOB: 04/18/1955, 64 y.o.   MRN: [id number]  HPI Patient is a 64 year old female with struct of sleep apnea and on CPAP for about 1 month.  She does not like using the machine.  She does not like the mask.  She is currently using a nasal cannula.  She feels like this keeps her up and is affecting her sleep.  She struggles going to sleep and then staying asleep. Currently she is getting no known benefit from CPAP.  She is using it nightly at least 4 hours.  She feels like she stays up just to the point where she can take the mask off.  She has not called the company and look for any other mask options.  She is having some right great toenail pain.  She has a history of ingrown toenails.  Hurts in the lateral side.  Denies any redness, swelling, discharge.  She wears loosefitting skews and denies any trauma.  She has started having more left hip pain. Hip replacement in 99. Plastic repaired about 10 years ago. Wants referral to surgeon.   .. Active Ambulatory Problems    Diagnosis Date Noted  . GERD (gastroesophageal reflux disease) 11/27/2015  . Osteoporosis 11/27/2015  . GAD (generalized anxiety disorder) 11/27/2015  . MDD (major depressive disorder), recurrent, in full remission ([place]) 11/27/2015  . Chronic dryness of both eyes 11/27/2015  . Acquired hypothyroidism 11/27/2015  . Asthma, chronic 11/27/2015  . Insomnia 11/27/2015  . Seborrheic keratoses 11/27/2015  . Diverticulitis of colon 12/21/2015  . Hip bursitis 12/25/2015  . Biceps tendonitis on right 12/25/2015  . Idiopathic scoliosis 01/22/2016  . Vaginal atrophy 01/31/2016  . Rosacea 02/04/2016  . Right knee pain 02/19/2016  . Paresthesia 04/03/2016  . Facet hypertrophy of lumbosacral region 04/03/2016  . Chronic back pain 04/03/2016  . Bad taste in mouth 07/26/2016  . No energy 09/15/2016  . Lump in throat 01/27/2017  . Dysphagia 01/27/2017  . Chronic tension-type  headache, intractable 02/05/2017  . Prediabetes 02/05/2017  . Right leg pain 02/06/2017  . Atypical chest pain 05/24/2017  . Itchy scalp 05/24/2017  . Pneumothorax 06/25/2017  . Angina, class III ([place]) 07/07/2017  . CAD S/P percutaneous coronary angioplasty   . Tongue mass 08/14/2017  . Excessive sweating 09/22/2017  . Rhinorrhea 01/04/2018  . Arthralgia 02/05/2018  . Nasal dryness 02/06/2018  . Hyperlipidemia LDL goal <70 11/29/2018  . DJD (degenerative joint disease), lumbosacral 11/29/2018  . Pre-operative clearance 11/29/2018  . Diabetes mellitus without complication ([place]) [id number]  . Migraine without aura and without status migrainosus, not intractable 02/08/2019  . OAB (overactive bladder) 02/08/2019  . Snoring 05/02/2019  . Non-restorative sleep 05/02/2019  . OSA on CPAP 05/20/2019  . Ingrown right greater toenail 07/19/2019  . Status post hip replacement, left 07/19/2019  . Left hip pain 07/19/2019   Resolved Ambulatory Problems    Diagnosis Date Noted  . Acute medial meniscus tear 04/23/2016  . Left knee pain 04/28/2016  . Mild intermittent asthma without complication [id number]   Past Medical History:  Diagnosis Date  . Anxiety   . Arthritis   . Coronary artery disease   . Depression   . History of blood transfusion   . Hypothyroidism   . Pneumonia 2016  . Pre-diabetes   . Thyroid disease        Review of Systems  All other systems reviewed and are negative.  Objective:   Physical Exam Vitals reviewed.  Constitutional:      Appearance: Normal appearance.  Cardiovascular:     Rate and Rhythm: Normal rate.     Pulses: Normal pulses.  Pulmonary:     Effort: Pulmonary effort is normal.  Musculoskeletal:     Comments: Right lateral toenail ingrown. No erythema, discharge or swelling. Tender to palpation.   Neurological:     General: No focal deficit present.     Mental Status: She is alert and oriented to person, place, and time.   Psychiatric:        Mood and Affect: Mood normal.           Assessment & Plan:  Marland KitchenMarland KitchenRosamary was seen today for follow-up.  Diagnoses and all orders for this visit:  OSA on CPAP  Ingrown right greater toenail  Left hip pain -     Ambulatory referral to Orthopedic Surgery  Status post hip replacement, left -     Ambulatory referral to Orthopedic Surgery   Patient is using CPAP at least 4 hours every night.  She is not happy with results or how it is keeping her up at night.  She is using the nasal cannula mask.  I believe there is a full breathing mass.  I would like for her air care to come out and get her set up with that.  I would like for patient to still stay consistent for at least another month or 2 to see if she can get any benefit of regular CPAP therapy.  Scheduled for Thursday for partial toenail removal due to ingrown toenail.  No signs of infection today.  Referral placed for ortho surgery. Left hip pain. ? Needing plastic replaced again. Replacement 99, 10 years ago plastic replaced and now for last few months pain again.

## 2019-07-19 ENCOUNTER — Encounter: Payer: Self-pay | Admitting: Physician Assistant

## 2019-07-19 DIAGNOSIS — L600 Ingrowing nail: Secondary | ICD-10-CM | POA: Insufficient documentation

## 2019-07-19 DIAGNOSIS — Z96642 Presence of left artificial hip joint: Secondary | ICD-10-CM | POA: Insufficient documentation

## 2019-07-19 DIAGNOSIS — M25552 Pain in left hip: Secondary | ICD-10-CM | POA: Insufficient documentation

## 2019-07-19 NOTE — Progress Notes (Signed)
Note sent to [facility] at [phone] with confirmation received.

## 2019-07-21 ENCOUNTER — Encounter: Payer: Self-pay | Admitting: Physician Assistant

## 2019-07-21 ENCOUNTER — Other Ambulatory Visit: Payer: Self-pay

## 2019-07-21 ENCOUNTER — Ambulatory Visit (INDEPENDENT_AMBULATORY_CARE_PROVIDER_SITE_OTHER): Payer: BC Managed Care – PPO | Admitting: Physician Assistant

## 2019-07-21 VITALS — BP 119/60 | HR 84 | Ht <= 58 in | Wt 128.0 lb

## 2019-07-21 DIAGNOSIS — L600 Ingrowing nail: Secondary | ICD-10-CM

## 2019-07-21 DIAGNOSIS — E119 Type 2 diabetes mellitus without complications: Secondary | ICD-10-CM

## 2019-07-21 LAB — POCT UA - MICROALBUMIN
Albumin/Creatinine Ratio, Urine, POC: <30
Creatinine, POC: 300 mg/dL
Microalbumin Ur, POC: 30 mg/L

## 2019-07-21 MED ORDER — HYDROCODONE-ACETAMINOPHEN 5-325 MG PO TABS: 1.0000 | ORAL_TABLET | Freq: Four times a day (QID) | ORAL | 0 refills | Status: AC | PRN

## 2019-07-21 NOTE — Patient Instructions (Signed)
Fingernail or Toenail Removal, Adult, Care After °This sheet gives you information about how to care for yourself after your procedure. Your health care provider may also give you more specific instructions. If you have problems or questions, contact your health care provider. °What can I expect after the procedure? °After the procedure, it is common to have: °· Pain. °· Redness. °· Swelling. °· Soreness. °Follow these instructions at home: °Medicines °· Take over-the-counter and prescription medicines only as told by your health care provider. °· If you were prescribed an antibiotic medicine, take or apply it as told by your health care provider. Do not stop using the antibiotic even if you start to feel better. °Wound care °· Follow instructions from your health care provider about how to take care of your wound. Make sure you: °? Wash your hands with soap and water for at least 20 seconds before and after you change your bandage (dressing). If soap and water are not available, use hand sanitizer. °? Change your dressing as told by your health care provider. °? Keep your dressing dry until your health care provider says it can be removed. °· Check your wound every day for signs of infection. Check for: °? More redness, swelling, or pain. °? More fluid or blood. °? Warmth. °? Pus or a bad smell. °If you have a splint: ° °· Wear the splint as told by your health care provider. Remove it only as told by your health care provider. °· Loosen the splint if your fingers tingle, become numb, or turn cold and blue. °· Keep the splint clean. °· If the splint is not waterproof: °? Do not let it get wet. °? Cover it with a watertight covering when you take a bath or a shower. °Managing pain, stiffness, and swelling °· Move your fingers or toes often to reduce stiffness and swelling. °· Raise (elevate) the injured area above the level of your heart while you are sitting or lying down. You may need to keep your hand or foot  raised or supported on a pillow for 24 hours or as told by your health care provider. °General instructions °· If you were given a shoe to wear, wear it as told by your health care provider. °· Keep all follow-up visits as told by your health care provider. This is important. °Contact a health care provider if: °· You have more redness, swelling, or pain around your wound. °· You have more fluid or blood coming from your wound. °· You have pus or a bad smell coming from your wound. °· Your wound feels warm to the touch. °· You have a fever. °Get help right away if: °· Your finger or toe looks pale, blue, or black. °· You are not able to move your finger or toe. °Summary °· After the procedure, it is common to have pain and swelling. °· Keep the hand or foot raised (elevated) or supported on a pillow as told by your health care provider. °· Take over-the-counter and prescription medicines only as told by your health care provider. °· Check your wound every day for signs of infection. °This information is not intended to replace advice given to you by your health care provider. Make sure you discuss any questions you have with your health care provider. °Document Revised: 11/08/2018 Document Reviewed: 11/08/2018 °Elsevier Patient Education © 2020 Elsevier Inc. ° °

## 2019-07-21 NOTE — Progress Notes (Signed)
Subjective:    Patient ID: Leslie Roberson, female    DOB: 29-Jul-1955, 64 y.o.   MRN: [id number]  HPI Pt comes in to have her right ingrown toenail removed today. No redness, swelling, discharge. She is having a lot of pain. Hx of ingrown toenails.   .. Active Ambulatory Problems    Diagnosis Date Noted  . GERD (gastroesophageal reflux disease) 11/27/2015  . Osteoporosis 11/27/2015  . GAD (generalized anxiety disorder) 11/27/2015  . MDD (major depressive disorder), recurrent, in full remission ([place]) 11/27/2015  . Chronic dryness of both eyes 11/27/2015  . Acquired hypothyroidism 11/27/2015  . Asthma, chronic 11/27/2015  . Insomnia 11/27/2015  . Seborrheic keratoses 11/27/2015  . Diverticulitis of colon 12/21/2015  . Hip bursitis 12/25/2015  . Biceps tendonitis on right 12/25/2015  . Idiopathic scoliosis 01/22/2016  . Vaginal atrophy 01/31/2016  . Rosacea 02/04/2016  . Right knee pain 02/19/2016  . Paresthesia 04/03/2016  . Facet hypertrophy of lumbosacral region 04/03/2016  . Chronic back pain 04/03/2016  . Bad taste in mouth 07/26/2016  . No energy 09/15/2016  . Lump in throat 01/27/2017  . Dysphagia 01/27/2017  . Chronic tension-type headache, intractable 02/05/2017  . Prediabetes 02/05/2017  . Right leg pain 02/06/2017  . Atypical chest pain 05/24/2017  . Itchy scalp 05/24/2017  . Pneumothorax 06/25/2017  . Angina, class III ([place]) 07/07/2017  . CAD S/P percutaneous coronary angioplasty   . Tongue mass 08/14/2017  . Excessive sweating 09/22/2017  . Rhinorrhea 01/04/2018  . Arthralgia 02/05/2018  . Nasal dryness 02/06/2018  . Hyperlipidemia LDL goal <70 11/29/2018  . DJD (degenerative joint disease), lumbosacral 11/29/2018  . Pre-operative clearance 11/29/2018  . Diabetes mellitus without complication ([name]) Q000111Q  . Migraine without aura and without status migrainosus, not intractable 02/08/2019  . OAB (overactive bladder) 02/08/2019  . Snoring  05/02/2019  . Non-restorative sleep 05/02/2019  . OSA on CPAP 05/20/2019  . Ingrown right greater toenail 07/19/2019  . Status post hip replacement, left 07/19/2019  . Left hip pain 07/19/2019   Resolved Ambulatory Problems    Diagnosis Date Noted  . Acute medial meniscus tear 04/23/2016  . Left knee pain 04/28/2016  . Mild intermittent asthma without complication [id number]   Past Medical History:  Diagnosis Date  . Anxiety   . Arthritis   . Coronary artery disease   . Depression   . History of blood transfusion   . Hypothyroidism   . Pneumonia 2016  . Pre-diabetes   . Thyroid disease       Review of Systems See hpi.    Objective:   Physical Exam Musculoskeletal:     Comments: Right lateral ingrown toenail. No redness, swelling, discharge. Pain to lateral nail edge.            Assessment & Plan:  Marland KitchenMarland KitchenJaydalyn was seen today for nail problem.  Diagnoses and all orders for this visit:  Ingrown right greater toenail -     HYDROcodone-acetaminophen (NORCO/VICODIN) 5-325 MG tablet; Take 1 tablet by mouth every 6 (six) hours as needed for up to 5 days for moderate pain.  Diabetes mellitus without complication ([place]) -     POCT UA - Microalbumin   Toenail Avulsion Procedure Note  Pre-operative Diagnosis: Right Ingrown Great toenail   Post-operative Diagnosis: Right Ingrown Great toenail  Indications: pain  Anesthesia: Lidocaine 1% without epinephrine without added sodium bicarbonate  Procedure Details  History of allergy to iodine: no  The risks (including bleeding and infection) and  benefits of the  procedure and Verbal informed consent obtained.  After digital block anesthesia was obtained, a tourniquet was applied for hemostasis during the procedure.  After prepping with Betadine, the offending edge of the nail was freed from the nailbed and perionychium, and then split with scissors and removed with  forceps.  All visible granulation tissue is debrided.  Antibiotic and bulky dressing was applied.   Findings: Ingrown toenail  Complications: none.  Plan: 1. Soak the foot twice daily. Change dressing twice daily until healed over. 2. Warning signs of infection were reviewed.   3. Recommended that the patient use norco as needed for pain.   .. Results for orders placed or performed in visit on 07/21/19  POCT UA - Microalbumin  Result Value Ref Range   Microalbumin Ur, POC 30 mg/L   Creatinine, POC 300 mg/dL   Albumin/Creatinine Ratio, Urine, POC <30

## 2019-07-27 ENCOUNTER — Encounter: Payer: Self-pay | Admitting: Orthopaedic Surgery

## 2019-07-27 ENCOUNTER — Ambulatory Visit: Payer: Self-pay

## 2019-07-27 ENCOUNTER — Ambulatory Visit: Payer: BC Managed Care – PPO | Admitting: Orthopaedic Surgery

## 2019-07-27 ENCOUNTER — Other Ambulatory Visit: Payer: Self-pay

## 2019-07-27 DIAGNOSIS — Z96649 Presence of unspecified artificial hip joint: Secondary | ICD-10-CM | POA: Diagnosis not present

## 2019-07-27 DIAGNOSIS — M25552 Pain in left hip: Secondary | ICD-10-CM

## 2019-07-27 DIAGNOSIS — Z96642 Presence of left artificial hip joint: Secondary | ICD-10-CM | POA: Diagnosis not present

## 2019-07-27 DIAGNOSIS — T84068A Wear of articular bearing surface of other internal prosthetic joint, initial encounter: Secondary | ICD-10-CM

## 2019-07-27 NOTE — Progress Notes (Signed)
Office Visit Note   Patient: Leslie Roberson           Date of Birth: Mar 16, 1956           MRN: [id number] Visit Date: 07/27/2019              Requested by: Donella Stade, PA-C [place],  [place] [address] PCP: Donella Stade, PA-C   Assessment & Plan: Visit Diagnoses:  1. Pain in left hip   2. History of total left hip replacement   3. 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  There is some evidence of polyliner wear.  Right now we are going to treat at least for acute left hip pain with stretching exercises of the shoulder to try as well as Voltaren gel over this area.  This will be on the trochanteric area of the left hip laterally.  I did recommend a steroid injection but she is deferring this right now before at least trying stretching and the Voltaren gel.  I would like to see her back in at least 6 months for repeat standing low AP pelvis and lateral of her left hip for comparison purposes to assess polyliner wear.  If she continues to have this dull aching pain I would order a three-phase bone scan to also rule out prosthetic loosening.  All questions and concerns were answered and addressed.  Follow-Up Instructions: Return in about 6 months (around 01/26/2020).   Orders:  Orders Placed This Encounter  Procedures  . XR HIP UNILAT W OR W/O PELVIS 1V LEFT   No orders of the defined types were placed in this encounter.     Procedures: No procedures performed   Clinical Data: No additional findings.   Subjective: Chief Complaint  Patient presents with  . Left Hip - Pain  The patient is a very pleasant 64 year old female I am seeing for the first time.  She is an active 64 year old who has been having left hip pain just recently.  Is more of an aching pain.  She started exercising again  and the pain is occurring around her left hip when she walks around the block.  She does feel occasional like it is popping when she walks.  She does have a history of a left total hip arthroplasty in 1999 through a posterior approach.  She then had a polyliner exchange in 2009 due to polyliner wear.  She also has a history of significant lumbar spine scoliosis.  She does have a leg length discrepancy she reports with her left operative side actually being shorter than the right side.  They did try to lengthen her she states with her surgery.  She unfortunately developed severe arthritis at a young age that did necessitate her hip replacement surgery in 1999.  She denies any active medical issues currently.  Her husband is with her.  She does have a history of osteoporosis.  She has a history of heart disease in the past.  There also is a list of other medical issues that can be seen in her epic chart.  She is on Pristiq and Prolia as well as several other medications.  She denies any groin pain at the left hip but does report pain on the lateral aspect of her left hip.  HPI  Review of  Systems She currently denies any headache, chest pain, shortness of breath, fever, chills, nausea, vomiting  Objective: Vital Signs: There were no vitals taken for this visit.  Physical Exam She is alert and orient x3 and in no acute distress Ortho Exam On examination standing you can see that she has an obvious scoliosis and leans more to the right side.  When I had her lay in a supine position there is definitely a leg length discrepancy with her left operative site shorter than the right.  She has smooth and fluid motion of her left hip with no pain in the groin at all.  Her incisions well-healed from the posterior aspect of her hip.  There is pain to palpation over the trochanteric area and the proximal IT band. Specialty Comments:  No specialty comments available.  Imaging: XR HIP UNILAT W OR W/O PELVIS 1V  LEFT  Result Date: 07/27/2019 An AP pelvis and lateral left hip shows a total hip arthroplasty.  There is some evidence of polyliner wear with the femoral head and just slightly off center position slightly more superior.  There is no significant evidence of osteolysis with no lytic changes or cystic changes around either acetabular component or femoral component or evidence of significant loosening.    PMFS History: Patient Active Problem List   Diagnosis Date Noted  . 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  . Ingrown right greater toenail 07/19/2019  . Status post hip replacement, left 07/19/2019  . Left hip pain 07/19/2019  . OSA on CPAP 05/20/2019  . Snoring 05/02/2019  . Non-restorative sleep 05/02/2019  . Migraine without aura and without status migrainosus, not intractable 02/08/2019  . OAB (overactive bladder) 02/08/2019  . Diabetes mellitus without complication ([place]) Q000111Q  . Hyperlipidemia LDL goal <70 11/29/2018  . DJD (degenerative joint disease), lumbosacral 11/29/2018  . Pre-operative clearance 11/29/2018  . Nasal dryness 02/06/2018  . Arthralgia 02/05/2018  . Rhinorrhea 01/04/2018  . Excessive sweating 09/22/2017  . Tongue mass 08/14/2017  . Angina, class III ([place]) 07/07/2017  . CAD S/P percutaneous coronary angioplasty   . Pneumothorax 06/25/2017  . Atypical chest pain 05/24/2017  . Itchy scalp 05/24/2017  . Right leg pain 02/06/2017  . Chronic tension-type headache, intractable 02/05/2017  . Prediabetes 02/05/2017  . Lump in throat 01/27/2017  . Dysphagia 01/27/2017  . No energy 09/15/2016  . Bad taste in mouth 07/26/2016  . Paresthesia 04/03/2016  . Facet hypertrophy of lumbosacral region 04/03/2016  . Chronic back pain 04/03/2016  . Right knee pain 02/19/2016  . Rosacea 02/04/2016  . Vaginal atrophy 01/31/2016  . Idiopathic scoliosis 01/22/2016  . Hip bursitis 12/25/2015  . Biceps tendonitis on right 12/25/2015  . Diverticulitis of  colon 12/21/2015  . GERD (gastroesophageal reflux disease) 11/27/2015  . Osteoporosis 11/27/2015  . GAD (generalized anxiety disorder) 11/27/2015  . MDD (major depressive disorder), recurrent, in full remission ([place]) 11/27/2015  . Chronic dryness of both eyes 11/27/2015  . Acquired hypothyroidism 11/27/2015  . Asthma, chronic 11/27/2015  . Insomnia 11/27/2015  . Seborrheic keratoses 11/27/2015   Past Medical History:  Diagnosis Date  . Anxiety   . Arthritis    "right little finger; base of thumb left hand; spine" (07/07/2017)  . Asthma, chronic 11/27/2015  . Chronic back pain   . Coronary artery disease    4/19 PCI/DEx2 to LAD, and PCI/DES x1 to RCA, normal EF  . Depression   . Diverticulitis of colon 12/21/2015   Colonoscopy every 5  years/digestive health.   . GERD (gastroesophageal reflux disease) 11/27/2015  . History of blood transfusion    "related to spinal fusions"  . Hypothyroidism   . Idiopathic scoliosis 01/22/2016  . Osteoporosis 11/27/2015   On prolia.   . Pneumonia 2016  . Pneumothorax 06/26/2017  . Pre-diabetes   . Thyroid disease     Family History  Problem Relation Age of Onset  . Cancer Mother        lung  . Depression Mother   . Heart attack Father   . Hyperlipidemia Father   . Hypertension Father   . Diabetes Maternal Aunt   . Hyperlipidemia Paternal Aunt   . COPD Paternal Aunt     Past Surgical History:  Procedure Laterality Date  . ABDOMINAL HYSTERECTOMY    . bladder mesh     S/P bladder sling"  . CORONARY STENT INTERVENTION N/A 07/07/2017   Procedure: CORONARY STENT INTERVENTION;  Surgeon: Leonie Man, MD;  Location: [facility];  Service: Cardiovascular;  Laterality: N/A;  . ELBOW SURGERY Right    "dislocation"  . INCONTINENCE SURGERY     "didn't work"  . INTRAVASCULAR PRESSURE WIRE/FFR STUDY N/A 07/07/2017   Procedure: INTRAVASCULAR PRESSURE WIRE/FFR STUDY;  Surgeon: Leonie Man, MD;  Location: [facility];  Service:  Cardiovascular;  Laterality: N/A;  . JOINT REPLACEMENT    . LEFT HEART CATH AND CORONARY ANGIOGRAPHY N/A 07/07/2017   Procedure: LEFT HEART CATH AND CORONARY ANGIOGRAPHY;  Surgeon: Leonie Man, MD;  Location: [facility];  Service: Cardiovascular;  Laterality: N/A;  . SPINAL FUSION  ~ 1970 X 3   "below neck - lower back"  . TONSILLECTOMY    . TOTAL HIP ARTHROPLASTY Left    Social History   Occupational History  . Not on file  Tobacco Use  . Smoking status: Never Smoker  . Smokeless tobacco: Never Used  Substance and Sexual Activity  . Alcohol use: Yes    Comment: 07/07/2017 "5-6 times/year; 1 drink at each occasion"  . Drug use: No  . Sexual activity: Yes

## 2019-07-28 DIAGNOSIS — M47816 Spondylosis without myelopathy or radiculopathy, lumbar region: Secondary | ICD-10-CM | POA: Diagnosis not present

## 2019-07-28 DIAGNOSIS — M7061 Trochanteric bursitis, right hip: Secondary | ICD-10-CM | POA: Diagnosis not present

## 2019-07-28 DIAGNOSIS — M5136 Other intervertebral disc degeneration, lumbar region: Secondary | ICD-10-CM | POA: Diagnosis not present

## 2019-08-03 DIAGNOSIS — G4733 Obstructive sleep apnea (adult) (pediatric): Secondary | ICD-10-CM | POA: Diagnosis not present

## 2019-08-15 ENCOUNTER — Other Ambulatory Visit: Payer: Self-pay | Admitting: Cardiology

## 2019-08-22 DIAGNOSIS — F333 Major depressive disorder, recurrent, severe with psychotic symptoms: Secondary | ICD-10-CM | POA: Diagnosis not present

## 2019-09-01 DIAGNOSIS — G4733 Obstructive sleep apnea (adult) (pediatric): Secondary | ICD-10-CM | POA: Diagnosis not present

## 2019-09-03 DIAGNOSIS — G4733 Obstructive sleep apnea (adult) (pediatric): Secondary | ICD-10-CM | POA: Diagnosis not present

## 2019-09-30 ENCOUNTER — Ambulatory Visit (INDEPENDENT_AMBULATORY_CARE_PROVIDER_SITE_OTHER): Payer: BC Managed Care – PPO | Admitting: Nurse Practitioner

## 2019-09-30 ENCOUNTER — Encounter: Payer: Self-pay | Admitting: Nurse Practitioner

## 2019-09-30 VITALS — BP 119/78 | HR 75 | Wt 130.0 lb

## 2019-09-30 DIAGNOSIS — L237 Allergic contact dermatitis due to plants, except food: Secondary | ICD-10-CM

## 2019-09-30 MED ORDER — TRIAMCINOLONE ACETONIDE 0.1 % EX OINT: 1.0000 "application " | TOPICAL_OINTMENT | Freq: Two times a day (BID) | CUTANEOUS | 1 refills | Status: DC

## 2019-09-30 NOTE — Patient Instructions (Signed)
If it starts to open up or get infected let me know through MyChart while you are out of town [facility] Valyncia Wiens) and I can send in an antibiotic cream for you if needed.    Contact Dermatitis Dermatitis is redness, soreness, and swelling (inflammation) of the skin. Contact dermatitis is a reaction to something that touches the skin. There are two types of contact dermatitis:  Irritant contact dermatitis. This happens when something bothers (irritates) your skin, like soap.  Allergic contact dermatitis. This is caused when you are exposed to something that you are allergic to, such as poison ivy. What are the causes?  Common causes of irritant contact dermatitis include: ? Makeup. ? Soaps. ? Detergents. ? Bleaches. ? Acids. ? Metals, such as nickel.  Common causes of allergic contact dermatitis include: ? Plants. ? Chemicals. ? Jewelry. ? Latex. ? Medicines. ? Preservatives in products, such as clothing. What increases the risk?  Having a job that exposes you to things that bother your skin.  Having asthma or eczema. What are the signs or symptoms? Symptoms may happen anywhere the irritant has touched your skin. Symptoms include:  Dry or flaky skin.  Redness.  Cracks.  Itching.  Pain or a burning feeling.  Blisters.  Blood or clear fluid draining from skin cracks. With allergic contact dermatitis, swelling may occur. This may happen in places such as the eyelids, mouth, or genitals. How is this treated?  This condition is treated by checking for the cause of the reaction and protecting your skin. Treatment may also include: ? Steroid creams, ointments, or medicines. ? Antibiotic medicines or other ointments, if you have a skin infection. ? Lotion or medicines to help with itching. ? A bandage (dressing). Follow these instructions at home: Skin care  Moisturize your skin as needed.  Put cool cloths on your skin.  Put a baking soda paste on your skin. Stir  water into baking soda until it looks like a paste.  Do not scratch your skin.  Avoid having things rub up against your skin.  Avoid the use of soaps, perfumes, and dyes. Medicines  Take or apply over-the-counter and prescription medicines only as told by your doctor.  If you were prescribed an antibiotic medicine, take or apply it as told by your doctor. Do not stop using it even if your condition starts to get better. Bathing  Take a bath with: ? Epsom salts. ? Baking soda. ? Colloidal oatmeal.  Bathe less often.  Bathe in warm water. Avoid using hot water. Bandage care  If you were given a bandage, change it as told by your health care provider.  Wash your hands with soap and water before and after you change your bandage. If soap and water are not available, use hand sanitizer. General instructions  Avoid the things that caused your reaction. If you do not know what caused it, keep a journal. Write down: ? What you eat. ? What skin products you use. ? What you drink. ? What you wear in the area that has symptoms. This includes jewelry.  Check the affected areas every day for signs of infection. Check for: ? More redness, swelling, or pain. ? More fluid or blood. ? Warmth. ? Pus or a bad smell.  Keep all follow-up visits as told by your doctor. This is important. Contact a doctor if:  You do not get better with treatment.  Your condition gets worse.  You have signs of infection, such as: ? More  swelling. ? Tenderness. ? More redness. ? Soreness. ? Warmth.  You have a fever.  You have new symptoms. Get help right away if:  You have a very bad headache.  You have neck pain.  Your neck is stiff.  You throw up (vomit).  You feel very sleepy.  You see red streaks coming from the area.  Your bone or joint near the area hurts after the skin has healed.  The area turns darker.  You have trouble breathing. Summary  Dermatitis is redness,  soreness, and swelling of the skin.  Symptoms may occur where the irritant has touched you.  Treatment may include medicines and skin care.  If you do not know what caused your reaction, keep a journal.  Contact a doctor if your condition gets worse or you have signs of infection. This information is not intended to replace advice given to you by your health care provider. Make sure you discuss any questions you have with your health care provider. Document Revised: 07/07/2018 Document Reviewed: 09/30/2017 Elsevier Patient Education  [employer].

## 2019-09-30 NOTE — Progress Notes (Signed)
Acute Office Visit  Subjective:    Patient ID: Leslie Roberson, female    DOB: 08/11/1955, 64 y.o.   MRN: [id number]  Chief Complaint  Patient presents with  . Rash    lower left leg rash x 1 week    HPI Patient is in today for rash on the lateral portion of the lower left leg that started about a week ago after working outside in her plants. She believes she may have been stuck by a holly bush leaf, but she may have also been bit by an insect. She reports the area has become red and warm with intense itching.   She denies rash in any other location, difficulty walking, muscle or joint pain, fever, or chills.   She has been using over the counter itch cream, but that has not been helpful. She leaves tomorrow for [place] and wants to make sure she has something to help eliminate the itch while she is in the heat.   Past Medical History:  Diagnosis Date  . Anxiety   . Arthritis    "right little finger; base of thumb left hand; spine" (07/07/2017)  . Asthma, chronic 11/27/2015  . Chronic back pain   . Coronary artery disease    4/19 PCI/DEx2 to LAD, and PCI/DES x1 to RCA, normal EF  . Depression   . Diverticulitis of colon 12/21/2015   Colonoscopy every 5 years/digestive health.   . GERD (gastroesophageal reflux disease) 11/27/2015  . History of blood transfusion    "related to spinal fusions"  . Hypothyroidism   . Idiopathic scoliosis 01/22/2016  . Osteoporosis 11/27/2015   On prolia.   . Pneumonia 2016  . Pneumothorax 06/26/2017  . Pre-diabetes   . Thyroid disease     Past Surgical History:  Procedure Laterality Date  . ABDOMINAL HYSTERECTOMY    . bladder mesh     S/P bladder sling"  . CORONARY STENT INTERVENTION N/A 07/07/2017   Procedure: CORONARY STENT INTERVENTION;  Surgeon: Leonie Man, MD;  Location: [facility];  Service: Cardiovascular;  Laterality: N/A;  . ELBOW SURGERY Right    "dislocation"  . INCONTINENCE SURGERY     "didn't work"  .  INTRAVASCULAR PRESSURE WIRE/FFR STUDY N/A 07/07/2017   Procedure: INTRAVASCULAR PRESSURE WIRE/FFR STUDY;  Surgeon: Leonie Man, MD;  Location: [facility];  Service: Cardiovascular;  Laterality: N/A;  . JOINT REPLACEMENT    . LEFT HEART CATH AND CORONARY ANGIOGRAPHY N/A 07/07/2017   Procedure: LEFT HEART CATH AND CORONARY ANGIOGRAPHY;  Surgeon: Leonie Man, MD;  Location: [facility];  Service: Cardiovascular;  Laterality: N/A;  . SPINAL FUSION  ~ 1970 X 3   "below neck - lower back"  . TONSILLECTOMY    . TOTAL HIP ARTHROPLASTY Left     Family History  Problem Relation Age of Onset  . Cancer Mother        lung  . Depression Mother   . Heart attack Father   . Hyperlipidemia Father   . Hypertension Father   . Diabetes Maternal Aunt   . Hyperlipidemia Paternal Aunt   . COPD Paternal Aunt     Social History   Socioeconomic History  . Marital status: Married    Spouse name: Not on file  . Number of children: 1  . Years of education: Not on file  . Highest education level: Not on file  Occupational History  . Not on file  Tobacco Use  .  Smoking status: Never Smoker  . Smokeless tobacco: Never Used  Vaping Use  . Vaping Use: Never used  Substance and Sexual Activity  . Alcohol use: Yes    Comment: 07/07/2017 "5-6 times/year; 1 drink at each occasion"  . Drug use: No  . Sexual activity: Yes  Other Topics Concern  . Not on file  Social History Narrative  . Not on file   Social Determinants of Health   Financial Resource Strain:   . Difficulty of Paying Living Expenses:   Food Insecurity:   . Worried About Charity fundraiser in the Last Year:   . Arboriculturist in the Last Year:   Transportation Needs:   . Film/video editor (Medical):   Marland Kitchen Lack of Transportation (Non-Medical):   Physical Activity:   . Days of Exercise per Week:   . Minutes of Exercise per Session:   Stress:   . Feeling of Stress :   Social Connections:   . Frequency of  Communication with Friends and Family:   . Frequency of Social Gatherings with Friends and Family:   . Attends Religious Services:   . Active Member of Clubs or Organizations:   . Attends Archivist Meetings:   Marland Kitchen Marital Status:   Intimate Partner Violence:   . Fear of Current or Ex-Partner:   . Emotionally Abused:   Marland Kitchen Physically Abused:   . Sexually Abused:     Outpatient Medications Prior to Visit  Medication Sig Dispense Refill  . albuterol (PROVENTIL HFA;VENTOLIN HFA) 108 (90 Base) MCG/ACT inhaler Inhale 1-2 puffs into the lungs every 4 (four) hours as needed for wheezing or shortness of breath. 1 Inhaler 3  . aspirin EC 81 MG tablet Take 81 mg by mouth at bedtime.    Marland Kitchen atorvastatin (LIPITOR) 20 MG tablet Take 1 tablet (20 mg total) by mouth daily. 90 tablet 3  . busPIRone (BUSPAR) 15 MG tablet Take 1 tablet (15 mg total) by mouth daily. 30 tablet 0  . calcium-vitamin D (OSCAL WITH D) 500-200 MG-UNIT tablet Take 1 tablet by mouth daily.    . clonazePAM (KLONOPIN) 0.5 MG tablet TAKE 1/2 TABLET BY MOUTH TWICE A DAY AS NEEDED FOR ANXIETY    . denosumab (PROLIA) 60 MG/ML SOLN injection Inject 60 mg into the skin every 6 (six) months. Administer in upper arm, thigh, or abdomen    . desvenlafaxine (PRISTIQ) 100 MG 24 hr tablet Take 100 mg by mouth at bedtime 30 tablet 0  . diclofenac sodium (VOLTAREN) 1 % GEL APPLY 4G TOPICALLY 4 TIMES DAILY 100 g 1  . gabapentin (NEURONTIN) 300 MG capsule Patient take 1 in the am and 2 at bedtime 270 capsule 3  . levothyroxine (SYNTHROID) 137 MCG tablet TAKE 1/2 TABLET DAILY 5 DAYS A WEEK AND 1 TABLET DAILY 2 DAYS A WEEK. 180 tablet 3  . lidocaine (LIDODERM) 5 % Place 1 patch onto the skin daily. Remove & Discard patch within 12 hours or as directed by MD 30 patch 12  . Menthol, Topical Analgesic, (MINERAL ICE EX) Apply 1 application to affected sites one to two times a day as needed (for pain)    . metFORMIN (GLUCOPHAGE XR) 500 MG 24 hr tablet  Take 1 tablet (500 mg total) by mouth daily with breakfast. 90 tablet 1  . metoprolol succinate (TOPROL-XL) 25 MG 24 hr tablet TAKE 1 TABLET (25 MG TOTAL) BY MOUTH DAILY. PLEASE KEEP UPCOMING APPT FOR REFILLS 90 tablet  3  . MYRBETRIQ 50 MG TB24 tablet Take 1 tablet (50 mg total) by mouth daily. 90 tablet 3  . nitroGLYCERIN (NITROSTAT) 0.4 MG SL tablet PLACE 1 TABLET (0.4 MG TOTAL) UNDER THE TONGUE EVERY 5 (FIVE) MINUTES AS NEEDED. 25 tablet 2  . Omega-3 Fatty Acids (FISH OIL PO) Take 1 capsule by mouth daily.     . pantoprazole (PROTONIX) 40 MG tablet TAKE 1 TABLET BY MOUTH EVERY DAY 90 tablet 1  . REXULTI 1 MG TABS Take 1 tablet by mouth daily.     . tamsulosin (FLOMAX) 0.4 MG CAPS capsule Take 1 capsule (0.4 mg total) by mouth daily. For next 15-30 days. 30 capsule 0  . topiramate (TOPAMAX) 50 MG tablet Take 1 tablet (50 mg total) by mouth 2 (two) times daily. 180 tablet 2   No facility-administered medications prior to visit.    Allergies  Allergen Reactions  . Hydromorphone Rash  . Levofloxacin Other (See Comments)    Hallucinations  . Meperidine Nausea And Vomiting and Other (See Comments)    Also "doesn't work well for my pain"  . Broccoli [Brassica Oleracea] Nausea Only and Other (See Comments)    Causes stomach pain also  . Adhesive [Tape] Rash      Objective:    Physical Exam Vitals and nursing note reviewed.  Constitutional:      Appearance: Normal appearance.  HENT:     Head: Normocephalic.  Eyes:     Extraocular Movements: Extraocular movements intact.     Conjunctiva/sclera: Conjunctivae normal.     Pupils: Pupils are equal, round, and reactive to light.  Cardiovascular:     Rate and Rhythm: Normal rate.  Pulmonary:     Effort: Pulmonary effort is normal.     Breath sounds: Normal breath sounds.  Musculoskeletal:        General: Normal range of motion.  Skin:    General: Skin is warm and dry.     Capillary Refill: Capillary refill takes less than 2 seconds.       Findings: Bruising, erythema and rash present.       Neurological:     General: No focal deficit present.     Mental Status: She is alert and oriented to person, place, and time.  Psychiatric:        Mood and Affect: Mood normal.        Behavior: Behavior normal.        Thought Content: Thought content normal.        Judgment: Judgment normal.     BP 119/78   Pulse 75   Wt 130 lb (59 kg)   SpO2 97%   BMI 27.17 kg/m  Wt Readings from Last 3 Encounters:  09/30/19 130 lb (59 kg)  07/21/19 128 lb (58.1 kg)  07/18/19 128 lb (58.1 kg)    There are no preventive care reminders to display for this patient.  There are no preventive care reminders to display for this patient.   Lab Results  Component Value Date   TSH 0.71 02/03/2019   Lab Results  Component Value Date   WBC 5.4 09/11/2017   HGB 13.5 09/11/2017   HCT 40.7 09/11/2017   MCV 88.9 09/11/2017   PLT 198 09/11/2017   Lab Results  Component Value Date   NA 143 03/29/2019   K 4.2 03/29/2019   CO2 27 03/29/2019   GLUCOSE 54 (L) 03/29/2019   BUN 18 03/29/2019   CREATININE 0.93 03/29/2019  BILITOT 0.3 03/29/2019   ALKPHOS 73 12/09/2017   AST 27 03/29/2019   ALT 37 (H) 03/29/2019   PROT 6.8 03/29/2019   ALBUMIN 4.4 12/09/2017   CALCIUM 10.3 03/29/2019   ANIONGAP 9 07/08/2017   Lab Results  Component Value Date   CHOL 140 02/03/2019   Lab Results  Component Value Date   HDL 85 02/03/2019   Lab Results  Component Value Date   LDLCALC 42 02/03/2019   Lab Results  Component Value Date   TRIG 57 02/03/2019   Lab Results  Component Value Date   CHOLHDL 1.6 02/03/2019   Lab Results  Component Value Date   HGBA1C 6.1 (H) 05/06/2019       Assessment & Plan:   1. Allergic contact dermatitis due to plants, except food Symptoms and presentation consistent with allergic contact dermatitis possibly due to stick with [employer]. There is underlying ecchymosis to the area, most likely due to  scratching of the area due to intense pruritis. Will trial triamcinolone ointment to the area twice a day to help with pruritis and inflammation. Discussed with the patient over the counter treatments that may also help with symptoms. We also discussed the importance of monitoring for signs of draining lesions or infection and to notify me via MyChart if this occurs. She will be going out of town in the morning and will not be available for office visit.   PLAN: - triamcinolone ointment (KENALOG) 0.1 %; Apply 1 application topically 2 (two) times daily. To affected area(s) as needed, sparing use to avoid whitening/thinning skin  Dispense: 30 g; Refill: 1 - Keep area clean and dry - If skin opening occurs, notify me via mychart - will plan for topical mupirocin and possible oral antibiotics depending on the severity of infection.   Follow-up if symptoms worsen or fail to improve  Orma Render, NP

## 2019-10-03 DIAGNOSIS — G4733 Obstructive sleep apnea (adult) (pediatric): Secondary | ICD-10-CM | POA: Diagnosis not present

## 2019-10-19 ENCOUNTER — Other Ambulatory Visit: Payer: Self-pay | Admitting: Physician Assistant

## 2019-10-19 DIAGNOSIS — E119 Type 2 diabetes mellitus without complications: Secondary | ICD-10-CM

## 2019-10-28 ENCOUNTER — Telehealth: Payer: Self-pay | Admitting: Physician Assistant

## 2019-10-28 DIAGNOSIS — M818 Other osteoporosis without current pathological fracture: Secondary | ICD-10-CM

## 2019-10-28 NOTE — Telephone Encounter (Signed)
Prolia never given in our office. Is it ok to give Prolia?

## 2019-10-28 NOTE — Telephone Encounter (Signed)
Pt called. She wants to get her Prolia injection but she  just realized that it was due June 30th. She said we would need to call her insurance for approval.   Thank you.

## 2019-10-31 NOTE — Telephone Encounter (Signed)
It was documented given on 03/30/19. Make sure she has cmp labs but then ok to give once approved.

## 2019-11-03 DIAGNOSIS — G4733 Obstructive sleep apnea (adult) (pediatric): Secondary | ICD-10-CM | POA: Diagnosis not present

## 2019-11-06 ENCOUNTER — Other Ambulatory Visit: Payer: Self-pay | Admitting: Physician Assistant

## 2019-11-06 DIAGNOSIS — K2100 Gastro-esophageal reflux disease with esophagitis, without bleeding: Secondary | ICD-10-CM

## 2019-11-08 NOTE — Telephone Encounter (Signed)
PA is still good until December 2021. Patient scheduled for injection and advised to go to the lab. Order sent under [facility].

## 2019-11-11 ENCOUNTER — Telehealth (INDEPENDENT_AMBULATORY_CARE_PROVIDER_SITE_OTHER): Payer: BC Managed Care – PPO | Admitting: Nurse Practitioner

## 2019-11-11 ENCOUNTER — Encounter: Payer: Self-pay | Admitting: Nurse Practitioner

## 2019-11-11 VITALS — Temp 97.7°F | Ht <= 58 in | Wt 130.0 lb

## 2019-11-11 DIAGNOSIS — J0100 Acute maxillary sinusitis, unspecified: Secondary | ICD-10-CM | POA: Diagnosis not present

## 2019-11-11 DIAGNOSIS — R05 Cough: Secondary | ICD-10-CM

## 2019-11-11 DIAGNOSIS — R059 Cough, unspecified: Secondary | ICD-10-CM

## 2019-11-11 MED ORDER — AMOXICILLIN-POT CLAVULANATE 875-125 MG PO TABS: 1.0000 | ORAL_TABLET | Freq: Two times a day (BID) | ORAL | 0 refills | Status: DC

## 2019-11-11 MED ORDER — BENZONATATE 200 MG PO CAPS: 200.0000 mg | ORAL_CAPSULE | Freq: Three times a day (TID) | ORAL | 0 refills | Status: DC | PRN

## 2019-11-11 NOTE — Patient Instructions (Signed)
Sinusitis, Adult Sinusitis is inflammation of your sinuses. Sinuses are hollow spaces in the bones around your face. Your sinuses are located:  Around your eyes.  In the middle of your forehead.  Behind your nose.  In your cheekbones. Mucus normally drains out of your sinuses. When your nasal tissues become inflamed or swollen, mucus can become trapped or blocked. This allows bacteria, viruses, and fungi to grow, which leads to infection. Most infections of the sinuses are caused by a virus. Sinusitis can develop quickly. It can last for up to 4 weeks (acute) or for more than 12 weeks (chronic). Sinusitis often develops after a cold. What are the causes? This condition is caused by anything that creates swelling in the sinuses or stops mucus from draining. This includes:  Allergies.  Asthma.  Infection from bacteria or viruses.  Deformities or blockages in your nose or sinuses.  Abnormal growths in the nose (nasal polyps).  Pollutants, such as chemicals or irritants in the air.  Infection from fungi (rare). What increases the risk? You are more likely to develop this condition if you:  Have a weak body defense system (immune system).  Do a lot of swimming or diving.  Overuse nasal sprays.  Smoke. What are the signs or symptoms? The main symptoms of this condition are pain and a feeling of pressure around the affected sinuses. Other symptoms include:  Stuffy nose or congestion.  Thick drainage from your nose.  Swelling and warmth over the affected sinuses.  Headache.  Upper toothache.  A cough that may get worse at night.  Extra mucus that collects in the throat or the back of the nose (postnasal drip).  Decreased sense of smell and taste.  Fatigue.  A fever.  Sore throat.  Bad breath. How is this diagnosed? This condition is diagnosed based on:  Your symptoms.  Your medical history.  A physical exam.  Tests to find out if your condition is  acute or chronic. This may include: ? Checking your nose for nasal polyps. ? Viewing your sinuses using a device that has a light (endoscope). ? Testing for allergies or bacteria. ? Imaging tests, such as an MRI or CT scan. In rare cases, a bone biopsy may be done to rule out more serious types of fungal sinus disease. How is this treated? Treatment for sinusitis depends on the cause and whether your condition is chronic or acute.  If caused by a virus, your symptoms should go away on their own within 10 days. You may be given medicines to relieve symptoms. They include: ? Medicines that shrink swollen nasal passages (topical intranasal decongestants). ? Medicines that treat allergies (antihistamines). ? A spray that eases inflammation of the nostrils (topical intranasal corticosteroids). ? Rinses that help get rid of thick mucus in your nose (nasal saline washes).  If caused by bacteria, your health care provider may recommend waiting to see if your symptoms improve. Most bacterial infections will get better without antibiotic medicine. You may be given antibiotics if you have: ? A severe infection. ? A weak immune system.  If caused by narrow nasal passages or nasal polyps, you may need to have surgery. Follow these instructions at home: Medicines  Take, use, or apply over-the-counter and prescription medicines only as told by your health care provider. These may include nasal sprays.  If you were prescribed an antibiotic medicine, take it as told by your health care provider. Do not stop taking the antibiotic even if you start   to feel better. Hydrate and humidify   Drink enough fluid to keep your urine pale yellow. Staying hydrated will help to thin your mucus.  Use a cool mist humidifier to keep the humidity level in your home above 50%.  Inhale steam for 10-15 minutes, 3-4 times a day, or as told by your health care provider. You can do this in the bathroom while a hot shower is  running.  Limit your exposure to cool or dry air. Rest  Rest as much as possible.  Sleep with your head raised (elevated).  Make sure you get enough sleep each night. General instructions   Apply a warm, moist washcloth to your face 3-4 times a day or as told by your health care provider. This will help with discomfort.  Wash your hands often with soap and water to reduce your exposure to germs. If soap and water are not available, use hand sanitizer.  Do not smoke. Avoid being around people who are smoking (secondhand smoke).  Keep all follow-up visits as told by your health care provider. This is important. Contact a health care provider if:  You have a fever.  Your symptoms get worse.  Your symptoms do not improve within 10 days. Get help right away if:  You have a severe headache.  You have persistent vomiting.  You have severe pain or swelling around your face or eyes.  You have vision problems.  You develop confusion.  Your neck is stiff.  You have trouble breathing. Summary  Sinusitis is soreness and inflammation of your sinuses. Sinuses are hollow spaces in the bones around your face.  This condition is caused by nasal tissues that become inflamed or swollen. The swelling traps or blocks the flow of mucus. This allows bacteria, viruses, and fungi to grow, which leads to infection.  If you were prescribed an antibiotic medicine, take it as told by your health care provider. Do not stop taking the antibiotic even if you start to feel better.  Keep all follow-up visits as told by your health care provider. This is important. This information is not intended to replace advice given to you by your health care provider. Make sure you discuss any questions you have with your health care provider. Document Revised: 08/17/2017 Document Reviewed: 08/17/2017 Elsevier Patient Education  2020 [employer]  

## 2019-11-11 NOTE — Progress Notes (Signed)
Started last Saturday Getting progressively worse Cough (productive, green phlegm) - worse at night Right eye - weeping/itchy Congestion Diarrhea last week, thinks unrelated Some chills  No sore throat No loss of taste/smell No nausea/vomiting No fevers  Taking mucinex DM, no other OTC meds

## 2019-11-11 NOTE — Progress Notes (Signed)
Virtual Video Visit via MyChart Note  I connected with  Leslie Roberson on 11/11/19 at  2:30 PM EDT by the video enabled telemedicine application for , MyChart, and verified that I am speaking with the correct person using two identifiers.   I introduced myself as a Designer, jewellery with the practice. We discussed the limitations of evaluation and management by telemedicine and the availability of in person appointments. The patient expressed understanding and agreed to proceed.  The patient is: at home I am: in the office  Subjective:    CC:  Chief Complaint  Patient presents with  . Cough    HPI: Leslie Roberson is a 64 y.o. y/o female presenting via [place] today for productive cough with green mucous, watery eyes, congestion in chest, sinus pain and pressure, chills, and diarrhea. She reports her symptoms first started last Saturday and have been progressively worsening. She has been taking mucinex with little relief.   She denies fever, shortness of breath, chest pain, ear pain, or body aches.   Past medical history, Surgical history, Family history not pertinant except as noted below, Social history, Allergies, and medications have been entered into the medical record, reviewed, and corrections made.   Review of Systems:  See HPI for pertinent positive and negatives  Objective:    General: Speaking clearly in complete sentences without any shortness of breath.   Alert and oriented x3.   Normal judgment.  No apparent acute distress.   Impression and Recommendations:    1. Acute non-recurrent maxillary sinusitis 2. Cough Symptoms and presentation consistent with acute sinusitis with cough. Plan to treat with augmentin and tessalon pearles. May continue mucinex and OK to take Delsym for cough.  Follow-up if symptoms worsen or fail to resolve.  - amoxicillin-clavulanate (AUGMENTIN) 875-125 MG tablet; Take 1 tablet by mouth 2 (two) times daily.  Dispense: 14 tablet;  Refill: 0 - benzonatate (TESSALON) 200 MG capsule; Take 1 capsule (200 mg total) by mouth 3 (three) times daily as needed for cough.  Dispense: 30 capsule; Refill: 0      I discussed the assessment and treatment plan with the patient. The patient was provided an opportunity to ask questions and all were answered. The patient agreed with the plan and demonstrated an understanding of the instructions.   The patient was advised to call back or seek an in-person evaluation if the symptoms worsen or if the condition fails to improve as anticipated.  I provided 20 minutes of non-face-to-face interaction with this [place] visit including intake, same-day documentation, and chart review.   Orma Render, NP

## 2019-11-14 DIAGNOSIS — M818 Other osteoporosis without current pathological fracture: Secondary | ICD-10-CM | POA: Diagnosis not present

## 2019-11-15 LAB — COMPLETE METABOLIC PANEL WITH GFR
AG Ratio: 1.5 (calc) (ref 1.0–2.5)
ALT: 17 U/L (ref 6–29)
AST: 16 U/L (ref 10–35)
Albumin: 3.9 g/dL (ref 3.6–5.1)
Alkaline phosphatase (APISO): 68 U/L (ref 37–153)
BUN: 14 mg/dL (ref 7–25)
CO2: 24 mmol/L (ref 20–32)
Calcium: 9.7 mg/dL (ref 8.6–10.4)
Chloride: 107 mmol/L (ref 98–110)
Creat: 0.91 mg/dL (ref 0.50–0.99)
GFR, Est African American: 77 mL/min/{1.73_m2} (ref >=60)
GFR, Est Non African American: 67 mL/min/{1.73_m2} (ref >=60)
Globulin: 2.6 g/dL (calc) (ref 1.9–3.7)
Glucose, Bld: 100 mg/dL — ABNORMAL HIGH (ref 65–99)
Potassium: 4.1 mmol/L (ref 3.5–5.3)
Sodium: 142 mmol/L (ref 135–146)
Total Bilirubin: 0.2 mg/dL (ref 0.2–1.2)
Total Protein: 6.5 g/dL (ref 6.1–8.1)

## 2019-11-15 NOTE — Telephone Encounter (Signed)
[place] for prolia to be done.

## 2019-11-16 ENCOUNTER — Other Ambulatory Visit: Payer: Self-pay

## 2019-11-16 ENCOUNTER — Ambulatory Visit (INDEPENDENT_AMBULATORY_CARE_PROVIDER_SITE_OTHER): Payer: BC Managed Care – PPO | Admitting: Physician Assistant

## 2019-11-16 VITALS — BP 114/59 | HR 66 | Wt 127.0 lb

## 2019-11-16 DIAGNOSIS — M818 Other osteoporosis without current pathological fracture: Secondary | ICD-10-CM | POA: Diagnosis not present

## 2019-11-16 MED ORDER — DENOSUMAB 60 MG/ML ~~LOC~~ SOSY
60.0000 mg | PREFILLED_SYRINGE | Freq: Once | SUBCUTANEOUS | Status: AC
  Administered 2019-11-16: 60 mg via SUBCUTANEOUS

## 2019-11-16 NOTE — Progress Notes (Signed)
Established Patient Office Visit  Subjective:  Patient ID: Leslie Roberson, female    DOB: 10-18-1955  Age: 64 y.o. MRN: [id number]  CC:  Chief Complaint  Patient presents with  . Osteoporosis    HPI Leslie Roberson presents for Prolia injection. CMP done prior to injection.   Past Medical History:  Diagnosis Date  . Anxiety   . Arthritis    "right little finger; base of thumb left hand; spine" (07/07/2017)  . Asthma, chronic 11/27/2015  . Chronic back pain   . Coronary artery disease    4/19 PCI/DEx2 to LAD, and PCI/DES x1 to RCA, normal EF  . Depression   . Diverticulitis of colon 12/21/2015   Colonoscopy every 5 years/digestive health.   . GERD (gastroesophageal reflux disease) 11/27/2015  . History of blood transfusion    "related to spinal fusions"  . Hypothyroidism   . Idiopathic scoliosis 01/22/2016  . Osteoporosis 11/27/2015   On prolia.   . Pneumonia 2016  . Pneumothorax 06/26/2017  . Pre-diabetes   . Thyroid disease     Past Surgical History:  Procedure Laterality Date  . ABDOMINAL HYSTERECTOMY    . bladder mesh     S/P bladder sling"  . CORONARY STENT INTERVENTION N/A 07/07/2017   Procedure: CORONARY STENT INTERVENTION;  Surgeon: Leonie Man, MD;  Location: [facility];  Service: Cardiovascular;  Laterality: N/A;  . ELBOW SURGERY Right    "dislocation"  . INCONTINENCE SURGERY     "didn't work"  . INTRAVASCULAR PRESSURE WIRE/FFR STUDY N/A 07/07/2017   Procedure: INTRAVASCULAR PRESSURE WIRE/FFR STUDY;  Surgeon: Leonie Man, MD;  Location: [facility];  Service: Cardiovascular;  Laterality: N/A;  . JOINT REPLACEMENT    . LEFT HEART CATH AND CORONARY ANGIOGRAPHY N/A 07/07/2017   Procedure: LEFT HEART CATH AND CORONARY ANGIOGRAPHY;  Surgeon: Leonie Man, MD;  Location: [facility];  Service: Cardiovascular;  Laterality: N/A;  . SPINAL FUSION  ~ 1970 X 3   "below neck - lower back"  . TONSILLECTOMY    . TOTAL HIP  ARTHROPLASTY Left     Family History  Problem Relation Age of Onset  . Cancer Mother        lung  . Depression Mother   . Heart attack Father   . Hyperlipidemia Father   . Hypertension Father   . Diabetes Maternal Aunt   . Hyperlipidemia Paternal Aunt   . COPD Paternal Aunt     Social History   Socioeconomic History  . Marital status: Married    Spouse name: Not on file  . Number of children: 1  . Years of education: Not on file  . Highest education level: Not on file  Occupational History  . Not on file  Tobacco Use  . Smoking status: Never Smoker  . Smokeless tobacco: Never Used  Vaping Use  . Vaping Use: Never used  Substance and Sexual Activity  . Alcohol use: Yes    Comment: 07/07/2017 "5-6 times/year; 1 drink at each occasion"  . Drug use: No  . Sexual activity: Yes  Other Topics Concern  . Not on file  Social History Narrative  . Not on file   Social Determinants of Health   Financial Resource Strain:   . Difficulty of Paying Living Expenses:   Food Insecurity:   . Worried About Charity fundraiser in the Last Year:   . [place] in the Last  Year:   Transportation Needs:   . Film/video editor (Medical):   Marland Kitchen Lack of Transportation (Non-Medical):   Physical Activity:   . Days of Exercise per Week:   . Minutes of Exercise per Session:   Stress:   . Feeling of Stress :   Social Connections:   . Frequency of Communication with Friends and Family:   . Frequency of Social Gatherings with Friends and Family:   . Attends Religious Services:   . Active Member of Clubs or Organizations:   . Attends Archivist Meetings:   Marland Kitchen Marital Status:   Intimate Partner Violence:   . Fear of Current or Ex-Partner:   . Emotionally Abused:   Marland Kitchen Physically Abused:   . Sexually Abused:     Outpatient Medications Prior to Visit  Medication Sig Dispense Refill  . albuterol (PROVENTIL HFA;VENTOLIN HFA) 108 (90 Base) MCG/ACT inhaler Inhale 1-2 puffs  into the lungs every 4 (four) hours as needed for wheezing or shortness of breath. 1 Inhaler 3  . amoxicillin-clavulanate (AUGMENTIN) 875-125 MG tablet Take 1 tablet by mouth 2 (two) times daily. 14 tablet 0  . aspirin EC 81 MG tablet Take 81 mg by mouth at bedtime.    Marland Kitchen atorvastatin (LIPITOR) 20 MG tablet Take 1 tablet (20 mg total) by mouth daily. 90 tablet 3  . benzonatate (TESSALON) 200 MG capsule Take 1 capsule (200 mg total) by mouth 3 (three) times daily as needed for cough. 30 capsule 0  . busPIRone (BUSPAR) 15 MG tablet Take 1 tablet (15 mg total) by mouth daily. 30 tablet 0  . calcium-vitamin D (OSCAL WITH D) 500-200 MG-UNIT tablet Take 1 tablet by mouth daily.    . clonazePAM (KLONOPIN) 0.5 MG tablet TAKE 1/2 TABLET BY MOUTH TWICE A DAY AS NEEDED FOR ANXIETY    . denosumab (PROLIA) 60 MG/ML SOLN injection Inject 60 mg into the skin every 6 (six) months. Administer in upper arm, thigh, or abdomen    . desvenlafaxine (PRISTIQ) 100 MG 24 hr tablet Take 100 mg by mouth at bedtime 30 tablet 0  . diclofenac sodium (VOLTAREN) 1 % GEL APPLY 4G TOPICALLY 4 TIMES DAILY 100 g 1  . gabapentin (NEURONTIN) 300 MG capsule Patient take 1 in the am and 2 at bedtime 270 capsule 3  . levothyroxine (SYNTHROID) 137 MCG tablet TAKE 1/2 TABLET DAILY 5 DAYS A WEEK AND 1 TABLET DAILY 2 DAYS A WEEK. 180 tablet 3  . lidocaine (LIDODERM) 5 % Place 1 patch onto the skin daily. Remove & Discard patch within 12 hours or as directed by MD 30 patch 12  . Menthol, Topical Analgesic, (MINERAL ICE EX) Apply 1 application to affected sites one to two times a day as needed (for pain)    . metFORMIN (GLUCOPHAGE-XR) 500 MG 24 hr tablet Take 1 tablet (500 mg total) by mouth daily with breakfast. Needs appt 90 tablet 0  . metoprolol succinate (TOPROL-XL) 25 MG 24 hr tablet TAKE 1 TABLET (25 MG TOTAL) BY MOUTH DAILY. PLEASE KEEP UPCOMING APPT FOR REFILLS 90 tablet 3  . MYRBETRIQ 50 MG TB24 tablet Take 1 tablet (50 mg total) by  mouth daily. 90 tablet 3  . nitroGLYCERIN (NITROSTAT) 0.4 MG SL tablet PLACE 1 TABLET (0.4 MG TOTAL) UNDER THE TONGUE EVERY 5 (FIVE) MINUTES AS NEEDED. (Patient not taking: Reported on 11/11/2019) 25 tablet 2  . Omega-3 Fatty Acids (FISH OIL PO) Take 1 capsule by mouth daily.     Marland Kitchen  pantoprazole (PROTONIX) 40 MG tablet TAKE 1 TABLET BY MOUTH EVERY DAY 90 tablet 3  . REXULTI 1 MG TABS Take 1 tablet by mouth daily.     . tamsulosin (FLOMAX) 0.4 MG CAPS capsule Take 1 capsule (0.4 mg total) by mouth daily. For next 15-30 days. 30 capsule 0  . topiramate (TOPAMAX) 50 MG tablet Take 1 tablet (50 mg total) by mouth 2 (two) times daily. 180 tablet 2  . triamcinolone ointment (KENALOG) 0.1 % Apply 1 application topically 2 (two) times daily. To affected area(s) as needed, sparing use to avoid whitening/thinning skin 30 g 1   No facility-administered medications prior to visit.    Allergies  Allergen Reactions  . Hydromorphone Rash  . Levofloxacin Other (See Comments)    Hallucinations  . Meperidine Nausea And Vomiting and Other (See Comments)    Also "doesn't work well for my pain"  . Broccoli [Brassica Oleracea] Nausea Only and Other (See Comments)    Causes stomach pain also  . Adhesive [Tape] Rash    ROS Review of Systems    Objective:    Physical Exam  BP (!) 114/59   Pulse 66   Wt 127 lb (57.6 kg)   SpO2 98%   BMI 26.54 kg/m  Wt Readings from Last 3 Encounters:  11/16/19 127 lb (57.6 kg)  11/11/19 130 lb (59 kg)  09/30/19 130 lb (59 kg)     Health Maintenance Due  Topic Date Due  . INFLUENZA VACCINE  10/30/2019  . HEMOGLOBIN A1C  11/03/2019    There are no preventive care reminders to display for this patient.  Lab Results  Component Value Date   TSH 0.71 02/03/2019   Lab Results  Component Value Date   WBC 5.4 09/11/2017   HGB 13.5 09/11/2017   HCT 40.7 09/11/2017   MCV 88.9 09/11/2017   PLT 198 09/11/2017   Lab Results  Component Value Date   NA 142  11/14/2019   K 4.1 11/14/2019   CO2 24 11/14/2019   GLUCOSE 100 (H) 11/14/2019   BUN 14 11/14/2019   CREATININE 0.91 11/14/2019   BILITOT 0.2 11/14/2019   ALKPHOS 73 12/09/2017   AST 16 11/14/2019   ALT 17 11/14/2019   PROT 6.5 11/14/2019   ALBUMIN 4.4 12/09/2017   CALCIUM 9.7 11/14/2019   ANIONGAP 9 07/08/2017   Lab Results  Component Value Date   CHOL 140 02/03/2019   Lab Results  Component Value Date   HDL 85 02/03/2019   Lab Results  Component Value Date   LDLCALC 42 02/03/2019   Lab Results  Component Value Date   TRIG 57 02/03/2019   Lab Results  Component Value Date   CHOLHDL 1.6 02/03/2019   Lab Results  Component Value Date   HGBA1C 6.1 (H) 05/06/2019      Assessment & Plan:  Osteoporosis - Patient tolerated injection well without complications. Patient advised to schedule next injection 6 month from today. Patient advised to call the office a week before for authorization and labs.   Problem List Items Addressed This Visit    Osteoporosis - Primary      Meds ordered this encounter  Medications  . denosumab (PROLIA) injection 60 mg    Follow-up: Return in about 6 months (around 05/18/2020) for Prolia injection. Durene Romans, Monico Blitz, CMA   Agree with above plan. Iran Planas PA-C

## 2019-11-21 DIAGNOSIS — F333 Major depressive disorder, recurrent, severe with psychotic symptoms: Secondary | ICD-10-CM | POA: Diagnosis not present

## 2019-11-22 DIAGNOSIS — I872 Venous insufficiency (chronic) (peripheral): Secondary | ICD-10-CM | POA: Diagnosis not present

## 2019-11-30 ENCOUNTER — Telehealth (INDEPENDENT_AMBULATORY_CARE_PROVIDER_SITE_OTHER): Payer: BC Managed Care – PPO | Admitting: Physician Assistant

## 2019-11-30 VITALS — Temp 98.1°F | Ht <= 58 in | Wt 127.0 lb

## 2019-11-30 DIAGNOSIS — R221 Localized swelling, mass and lump, neck: Secondary | ICD-10-CM

## 2019-11-30 DIAGNOSIS — J029 Acute pharyngitis, unspecified: Secondary | ICD-10-CM | POA: Diagnosis not present

## 2019-11-30 MED ORDER — METHYLPREDNISOLONE 4 MG PO TBPK: ORAL_TABLET | ORAL | 0 refills | Status: DC

## 2019-11-30 NOTE — Progress Notes (Signed)
Patient ID: Leslie Roberson, female   DOB: 03/30/1956, 64 y.o.   MRN: [id number] .Marland KitchenVirtual Visit via Video Note  I connected with Leslie Roberson on 11/30/2019 at  2:20 PM EDT by a video enabled telemedicine application and verified that I am speaking with the correct person using two identifiers.  Location: Patient: home Provider: clinic   I discussed the limitations of evaluation and management by telemedicine and the availability of in person appointments. The patient expressed understanding and agreed to proceed.  History of Present Illness: Patient is a 64 year old female who calls into the clinic with ST after sinus infection earlier this month. She feels like "there is a lump in my throat". Finished abx and congestion and cough has cleared up. Seems to be worse at night and in the mornings. No fever, chills, body aches, SOB, nausea, vomiting, diarrhea, abdominal pain. No melena or hematochezia. Has had tonsil removed. Taking OTC tylenol for pain and helps some.  She is on protonix for GERD. No changes made to this medication.  .. Active Ambulatory Problems    Diagnosis Date Noted   GERD (gastroesophageal reflux disease) 11/27/2015   Osteoporosis 11/27/2015   GAD (generalized anxiety disorder) 11/27/2015   MDD (major depressive disorder), recurrent, in full remission ([place]) 11/27/2015   Chronic dryness of both eyes 11/27/2015   Acquired hypothyroidism 11/27/2015   Asthma, chronic 11/27/2015   Insomnia 11/27/2015   Seborrheic keratoses 11/27/2015   Diverticulitis of colon 12/21/2015   Hip bursitis 12/25/2015   Biceps tendonitis on right 12/25/2015   Idiopathic scoliosis 01/22/2016   Vaginal atrophy 01/31/2016   Rosacea 02/04/2016   Right knee pain 02/19/2016   Paresthesia 04/03/2016   Facet hypertrophy of lumbosacral region 04/03/2016   Chronic back pain 04/03/2016   Bad taste in mouth 07/26/2016   No energy 09/15/2016   Lump in throat 01/27/2017    Dysphagia 01/27/2017   Chronic tension-type headache, intractable 02/05/2017   Prediabetes 02/05/2017   Right leg pain 02/06/2017   Atypical chest pain 05/24/2017   Itchy scalp 05/24/2017   Pneumothorax 06/25/2017   Angina, class III ([place]) 07/07/2017   CAD S/P percutaneous coronary angioplasty    Tongue mass 08/14/2017   Excessive sweating 09/22/2017   Rhinorrhea 01/04/2018   Arthralgia 02/05/2018   Nasal dryness 02/06/2018   Hyperlipidemia LDL goal <70 11/29/2018   DJD (degenerative joint disease), lumbosacral 11/29/2018   Pre-operative clearance 11/29/2018   Diabetes mellitus without complication ([place]) 48/27/0786   Migraine without aura and without status migrainosus, not intractable 02/08/2019   OAB (overactive bladder) 02/08/2019   Snoring 05/02/2019   Non-restorative sleep 05/02/2019   OSA on CPAP 05/20/2019   Ingrown right greater toenail 07/19/2019   Status post hip replacement, left 07/19/2019   Left hip pain 07/19/2019   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   Resolved Ambulatory Problems    Diagnosis Date Noted   Acute medial meniscus tear 04/23/2016   Left knee pain 04/28/2016   Mild intermittent asthma without complication 75/44/9201   Past Medical History:  Diagnosis Date   Anxiety    Arthritis    Coronary artery disease    Depression    History of blood transfusion    Hypothyroidism    Pneumonia 2016   Pre-diabetes    Thyroid disease    Reviewed med, allergy, problem list.    Observations/Objective: No acute distress No cough or labored breathing Normal mood and appearance.   .. Today's Vitals   11/30/19  1348  Temp: 98.1 F (36.7 C)  TempSrc: Oral  Weight: 127 lb (57.6 kg)  Height: 4\' 10"  (1.473 m)   Body mass index is 26.54 kg/m.    Assessment and Plan: Marland KitchenMarland KitchenReneisha was seen today for sore throat.  Diagnoses and all orders for this visit:  Sore throat -     methylPREDNISolone (MEDROL  DOSEPAK) 4 MG TBPK tablet; Take as directed by package insert.  Lump in throat -     methylPREDNISolone (MEDROL DOSEPAK) 4 MG TBPK tablet; Take as directed by package insert.   DDX worsening GERD or post viral inflammation due to PND. Just finished abx. No signs of bacterial infection. Pt has had tonsils removed. Start medrol dose pack and gargle with salt water. Ok to treat pain with tylenol and or motrin. Follow up as needed or if symptoms worsen.    Follow Up Instructions:    I discussed the assessment and treatment plan with the patient. The patient was provided an opportunity to ask questions and all were answered. The patient agreed with the plan and demonstrated an understanding of the instructions.   The patient was advised to call back or seek an in-person evaluation if the symptoms worsen or if the condition fails to improve as anticipated.  I provided 9 minutes of non-face-to-face time during this encounter.   Iran Planas, PA-C

## 2019-11-30 NOTE — Progress Notes (Signed)
Sore throat - "feels like lump in throat"  Going on since she had a cold, was on antibiotics Issues with swallowing, worse last night  Cough (particularly in the AM)  No fever, body aches, chills No nausea, vomiting, diarrhea  Taking tylenol OTC - to try to help with swelling, helped some

## 2019-12-01 ENCOUNTER — Encounter: Payer: Self-pay | Admitting: Physician Assistant

## 2019-12-01 DIAGNOSIS — M7061 Trochanteric bursitis, right hip: Secondary | ICD-10-CM | POA: Diagnosis not present

## 2019-12-01 DIAGNOSIS — M47816 Spondylosis without myelopathy or radiculopathy, lumbar region: Secondary | ICD-10-CM | POA: Diagnosis not present

## 2019-12-01 DIAGNOSIS — M5136 Other intervertebral disc degeneration, lumbar region: Secondary | ICD-10-CM | POA: Diagnosis not present

## 2019-12-01 DIAGNOSIS — M7551 Bursitis of right shoulder: Secondary | ICD-10-CM | POA: Diagnosis not present

## 2019-12-06 ENCOUNTER — Encounter: Payer: Self-pay | Admitting: Physician Assistant

## 2019-12-06 MED ORDER — AMOXICILLIN-POT CLAVULANATE 875-125 MG PO TABS: 1.0000 | ORAL_TABLET | Freq: Two times a day (BID) | ORAL | 0 refills | Status: DC

## 2019-12-24 ENCOUNTER — Other Ambulatory Visit: Payer: Self-pay

## 2019-12-24 ENCOUNTER — Emergency Department
Admission: EM | Admit: 2019-12-24 | Discharge: 2019-12-24 | Disposition: A | Discharge status: Home / Self Care | Payer: BC Managed Care – PPO | Source: Home / Self Care | Attending: Family Medicine | Admitting: Family Medicine

## 2019-12-24 DIAGNOSIS — R1032 Left lower quadrant pain: Secondary | ICD-10-CM

## 2019-12-24 LAB — POCT URINALYSIS DIP (MANUAL ENTRY)
Bilirubin, UA: NEGATIVE
Blood, UA: NEGATIVE
Glucose, UA: NEGATIVE mg/dL
Ketones, POC UA: NEGATIVE mg/dL
Leukocytes, UA: NEGATIVE
Nitrite, UA: NEGATIVE
Protein Ur, POC: NEGATIVE mg/dL
Spec Grav, UA: >=1.03 — AB (ref 1.010–1.025)
Urobilinogen, UA: 0.2 E.U./dL
pH, UA: 5.5 (ref 5.0–8.0)

## 2019-12-24 MED ORDER — AMOXICILLIN-POT CLAVULANATE 875-125 MG PO TABS: ORAL_TABLET | ORAL | 0 refills | Status: DC

## 2019-12-24 NOTE — Discharge Instructions (Addendum)
Begin clear liquids for about 24 hours, then may begin a BRAT diet (Bananas, Rice, Applesauce, Toast) when abdominal pain improved.  Then gradually advance to a regular diet as tolerated.  May take Tylenol as needed for pain.  If symptoms become significantly worse during the night or over the weekend, proceed to the local emergency room.

## 2019-12-24 NOTE — ED Triage Notes (Signed)
Pt states that she has some pain on her left side (lower abdominal side) x2 days. Pt states that she has been vaccinated

## 2019-12-24 NOTE — ED Provider Notes (Signed)
Leslie Roberson CARE    CSN: [id number] Arrival date & time: 12/24/19  1419      History   Chief Complaint Abdominal pain   HPI Leslie Roberson is a 64 y.o. female.   Two nights ago after walking patient developed vague discomfort in her left lower quadrant.  The pain has now become constant, worse with cough and standing from a chair.  She recalls no injury.  She denies fevers, chills, and sweats and urinary symptoms.  She has had no changes in her bowel movements. Past surgical history of hysterectomy and partial colectomy.  The history is provided by the patient.  Abdominal Pain Pain location:  LLQ Pain quality: aching   Pain radiates to:  Does not radiate Pain severity:  Mild Onset quality:  Gradual Timing:  Constant Progression:  Worsening Chronicity:  New Context: previous surgery   Context: not diet changes, not eating, not recent illness, not recent travel and not trauma   Relieved by:  Nothing Worsened by:  Position changes Ineffective treatments:  None tried Associated symptoms: no anorexia, no belching, no chest pain, no chills, no constipation, no cough, no diarrhea, no dysuria, no fatigue, no fever, no hematemesis, no hematochezia, no hematuria, no melena, no nausea and no vomiting   Risk factors: multiple surgeries     Past Medical History:  Diagnosis Date  . Anxiety   . Arthritis    "right little finger; base of thumb left hand; spine" (07/07/2017)  . Asthma, chronic 11/27/2015  . Chronic back pain   . Coronary artery disease    4/19 PCI/DEx2 to LAD, and PCI/DES x1 to RCA, normal EF  . Depression   . Diverticulitis of colon 12/21/2015   Colonoscopy every 5 years/digestive health.   . GERD (gastroesophageal reflux disease) 11/27/2015  . History of blood transfusion    "related to spinal fusions"  . Hypothyroidism   . Idiopathic scoliosis 01/22/2016  . Osteoporosis 11/27/2015   On prolia.   . Pneumonia 2016  . Pneumothorax 06/26/2017  .  Pre-diabetes   . Thyroid disease     Patient Active Problem List   Diagnosis Date Noted  . 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  . Ingrown right greater toenail 07/19/2019  . Status post hip replacement, left 07/19/2019  . Left hip pain 07/19/2019  . OSA on CPAP 05/20/2019  . Snoring 05/02/2019  . Non-restorative sleep 05/02/2019  . Migraine without aura and without status migrainosus, not intractable 02/08/2019  . OAB (overactive bladder) 02/08/2019  . Diabetes mellitus without complication ([place]) 11/94/1740  . Hyperlipidemia LDL goal <70 11/29/2018  . DJD (degenerative joint disease), lumbosacral 11/29/2018  . Pre-operative clearance 11/29/2018  . Nasal dryness 02/06/2018  . Arthralgia 02/05/2018  . Rhinorrhea 01/04/2018  . Excessive sweating 09/22/2017  . Tongue mass 08/14/2017  . Angina, class III ([place]) 07/07/2017  . CAD S/P percutaneous coronary angioplasty   . Pneumothorax 06/25/2017  . Atypical chest pain 05/24/2017  . Itchy scalp 05/24/2017  . Right leg pain 02/06/2017  . Chronic tension-type headache, intractable 02/05/2017  . Prediabetes 02/05/2017  . Lump in throat 01/27/2017  . Dysphagia 01/27/2017  . No energy 09/15/2016  . Bad taste in mouth 07/26/2016  . Paresthesia 04/03/2016  . Facet hypertrophy of lumbosacral region 04/03/2016  . Chronic back pain 04/03/2016  . Right knee pain 02/19/2016  . Rosacea 02/04/2016  . Vaginal atrophy 01/31/2016  . Idiopathic scoliosis 01/22/2016  . Hip bursitis 12/25/2015  . Biceps tendonitis  on right 12/25/2015  . Diverticulitis of colon 12/21/2015  . GERD (gastroesophageal reflux disease) 11/27/2015  . Osteoporosis 11/27/2015  . GAD (generalized anxiety disorder) 11/27/2015  . MDD (major depressive disorder), recurrent, in full remission ([place]) 11/27/2015  . Chronic dryness of both eyes 11/27/2015  . Acquired hypothyroidism 11/27/2015  . Asthma, chronic 11/27/2015  . Insomnia 11/27/2015  . Seborrheic  keratoses 11/27/2015    Past Surgical History:  Procedure Laterality Date  . ABDOMINAL HYSTERECTOMY    . bladder mesh     S/P bladder sling"  . CORONARY STENT INTERVENTION N/A 07/07/2017   Procedure: CORONARY STENT INTERVENTION;  Surgeon: Leonie Man, MD;  Location: [place] [facility];  Service: Cardiovascular;  Laterality: N/A;  . ELBOW SURGERY Right    "dislocation"  . INCONTINENCE SURGERY     "didn't work"  . INTRAVASCULAR PRESSURE WIRE/FFR STUDY N/A 07/07/2017   Procedure: INTRAVASCULAR PRESSURE WIRE/FFR STUDY;  Surgeon: Leonie Man, MD;  Location: [place] [facility];  Service: Cardiovascular;  Laterality: N/A;  . JOINT REPLACEMENT    . LEFT HEART CATH AND CORONARY ANGIOGRAPHY N/A 07/07/2017   Procedure: LEFT HEART CATH AND CORONARY ANGIOGRAPHY;  Surgeon: Leonie Man, MD;  Location: [place] [facility];  Service: Cardiovascular;  Laterality: N/A;  . SPINAL FUSION  ~ 1970 X 3   "below neck - lower back"  . TONSILLECTOMY    . TOTAL HIP ARTHROPLASTY Left     OB History   No obstetric history on file.      Home Medications    Prior to Admission medications   Medication Sig Start Date End Date Taking? Authorizing Provider  albuterol (PROVENTIL HFA;VENTOLIN HFA) 108 (90 Base) MCG/ACT inhaler Inhale 1-2 puffs into the lungs every 4 (four) hours as needed for wheezing or shortness of breath. 03/04/18  Yes Gregor Hams, MD  aspirin EC 81 MG tablet Take 81 mg by mouth at bedtime.   Yes [provider]  atorvastatin (LIPITOR) 20 MG tablet Take 1 tablet (20 mg total) by mouth daily. 02/07/19  Yes Breeback, Jade L, PA-C  busPIRone (BUSPAR) 15 MG tablet Take 1 tablet (15 mg total) by mouth daily. 11/10/17  Yes Merian Capron, MD  calcium-vitamin D (OSCAL WITH D) 500-200 MG-UNIT tablet Take 1 tablet by mouth daily.   Yes [provider]  clonazePAM (KLONOPIN) 0.5 MG tablet TAKE 1/2 TABLET BY MOUTH TWICE A DAY AS NEEDED FOR ANXIETY 06/15/18  Yes [provider]  denosumab (PROLIA) 60 MG/ML SOLN injection Inject 60 mg into the skin every 6 (six) months. Administer in upper arm, thigh, or abdomen   Yes [provider]  desvenlafaxine (PRISTIQ) 100 MG 24 hr tablet Take 100 mg by mouth at bedtime 11/10/17  Yes Merian Capron, MD  diclofenac sodium (VOLTAREN) 1 % GEL APPLY 4G TOPICALLY 4 TIMES DAILY 02/03/18  Yes Breeback, Jade L, PA-C  gabapentin (NEURONTIN) 300 MG capsule Patient take 1 in the am and 2 at bedtime 06/16/19  Yes Breeback, Jade L, PA-C  levothyroxine (SYNTHROID) 137 MCG tablet TAKE 1/2 TABLET DAILY 5 DAYS A WEEK AND 1 TABLET DAILY 2 DAYS A WEEK. 02/07/19  Yes Breeback, Jade L, PA-C  lidocaine (LIDODERM) 5 % Place 1 patch onto the skin daily. Remove & Discard patch within 12 hours or as directed by MD 06/11/17  Yes Gregor Hams, MD  Menthol, Topical Analgesic, (MINERAL ICE EX) Apply 1 application to affected sites one to two times a  day as needed (for pain)   Yes [provider]  metFORMIN (GLUCOPHAGE-XR) 500 MG 24 hr tablet Take 1 tablet (500 mg total) by mouth daily with breakfast. Needs appt 10/19/19  Yes Breeback, Jade L, PA-C  methylPREDNISolone (MEDROL DOSEPAK) 4 MG TBPK tablet Take as directed by package insert. 11/30/19  Yes Breeback, Jade L, PA-C  metoprolol succinate (TOPROL-XL) 25 MG 24 hr tablet TAKE 1 TABLET (25 MG TOTAL) BY MOUTH DAILY. PLEASE KEEP UPCOMING APPT FOR REFILLS 08/16/19  Yes Crenshaw, Denice Bors, MD  MYRBETRIQ 50 MG TB24 tablet Take 1 tablet (50 mg total) by mouth daily. 02/07/19  Yes Breeback, Jade L, PA-C  nitroGLYCERIN (NITROSTAT) 0.4 MG SL tablet PLACE 1 TABLET (0.4 MG TOTAL) UNDER THE TONGUE EVERY 5 (FIVE) MINUTES AS NEEDED. 09/28/17  Yes Crenshaw, Denice Bors, MD  Omega-3 Fatty Acids (FISH OIL PO) Take 1 capsule by mouth daily.    Yes [provider]  pantoprazole (PROTONIX) 40 MG tablet TAKE 1 TABLET BY MOUTH EVERY DAY 11/07/19  Yes Breeback, Jade L, PA-C  REXULTI 1 MG TABS Take 1 tablet by  mouth daily.  07/16/18  Yes [provider]  tamsulosin (FLOMAX) 0.4 MG CAPS capsule Take 1 capsule (0.4 mg total) by mouth daily. For next 15-30 days. 02/23/19  Yes Breeback, Jade L, PA-C  topiramate (TOPAMAX) 50 MG tablet Take 1 tablet (50 mg total) by mouth 2 (two) times daily. 02/07/19  Yes Breeback, Jade L, PA-C  triamcinolone ointment (KENALOG) 0.1 % Apply 1 application topically 2 (two) times daily. To affected area(s) as needed, sparing use to avoid whitening/thinning skin 09/30/19  Yes Early, Coralee Pesa, NP  amoxicillin-clavulanate (AUGMENTIN) 875-125 MG tablet Take one tab PO Q8hr 12/24/19   Kandra Nicolas, MD  FLUoxetine (PROZAC) 10 MG capsule Take 1 capsule (10 mg total) by mouth daily. 10/13/17 11/10/17  Merian Capron, MD    Family History Family History  Problem Relation Age of Onset  . Cancer Mother        lung  . Depression Mother   . Heart attack Father   . Hyperlipidemia Father   . Hypertension Father   . Diabetes Maternal Aunt   . Hyperlipidemia Paternal Aunt   . COPD Paternal Aunt     Social History Social History   Tobacco Use  . Smoking status: Never Smoker  . Smokeless tobacco: Never Used  Vaping Use  . Vaping Use: Never used  Substance Use Topics  . Alcohol use: Yes    Comment: 07/07/2017 "5-6 times/year; 1 drink at each occasion"  . Drug use: No     Allergies   Hydromorphone, Levofloxacin, Meperidine, Broccoli [brassica oleracea], and Adhesive [tape]   Review of Systems Review of Systems  Constitutional: Negative for appetite change, chills, fatigue and fever.  Respiratory: Negative for cough.   Cardiovascular: Negative for chest pain.  Gastrointestinal: Positive for abdominal pain. Negative for anorexia, constipation, diarrhea, hematemesis, hematochezia, melena, nausea and vomiting.  Genitourinary: Negative for dysuria, flank pain, frequency, hematuria, pelvic pain and urgency.  All other systems reviewed and are negative.    Physical  Exam Triage Vital Signs ED Triage Vitals  Enc Vitals Group     BP 12/24/19 1440 106/69     Pulse Rate 12/24/19 1440 66     Resp --      Temp 12/24/19 1440 99.1 F (37.3 C)     Temp Source 12/24/19 1440 Oral     SpO2 12/24/19 1440 95 %  Weight 12/24/19 1438 128 lb (58.1 kg)     Height 12/24/19 1438 4\' 10"  (1.473 m)     Head Circumference --      Peak Flow --      Pain Score 12/24/19 1438 7     Pain Loc --      Pain Edu? --      Excl. in [place]? --    No data found.  Updated Vital Signs BP 106/69 (BP Location: Left Arm)   Pulse 66   Temp 99.1 F (37.3 C) (Oral)   Ht 4\' 10"  (1.473 m)   Wt 58.1 kg   SpO2 95%   BMI 26.75 kg/m   Visual Acuity Right Eye Distance:   Left Eye Distance:   Bilateral Distance:    Right Eye Near:   Left Eye Near:    Bilateral Near:     Physical Exam Vitals and nursing note reviewed.  Constitutional:      General: She is not in acute distress. HENT:     Head: Normocephalic.     Nose: Nose normal.     Mouth/Throat:     Pharynx: Oropharynx is clear.  Eyes:     Conjunctiva/sclera: Conjunctivae normal.     Pupils: Pupils are equal, round, and reactive to light.  Cardiovascular:     Rate and Rhythm: Normal rate and regular rhythm.     Heart sounds: Normal heart sounds.  Pulmonary:     Breath sounds: Normal breath sounds.  Abdominal:     General: Bowel sounds are normal.     Palpations: Abdomen is soft. There is no hepatomegaly, splenomegaly or mass.     Tenderness: There is abdominal tenderness in the left lower quadrant. There is no right CVA tenderness, left CVA tenderness or guarding.     Hernia: There is no hernia in the umbilical area or ventral area.       Comments: Distinct tenderness to palpation left lower quadrant as noted on diagram.   Musculoskeletal:        General: No swelling.     Cervical back: Neck supple.  Lymphadenopathy:     Cervical: No cervical adenopathy.  Skin:    General: Skin is warm and dry.      Findings: No rash.  Neurological:     Mental Status: She is alert and oriented to person, place, and time.      UC Treatments / Results  Labs (all labs ordered are listed, but only abnormal results are displayed) Labs Reviewed  POCT URINALYSIS DIP (MANUAL ENTRY) - Abnormal; Notable for the following components:      Result Value   Spec Grav, UA >=1.030 (*)    All other components within normal limits  POCT CBC W AUTO DIFF ([facility]):  WBC 5.7; LY 27.1; MO 2.6; GR 70.3; Hgb 12.2; Platelets 182     EKG   Radiology No results found.  Procedures Procedures (including critical care time)  Medications Ordered in UC Medications - No data to display  Initial Impression / Assessment and Plan / UC Course  I have reviewed the triage vital signs and the nursing notes.  Pertinent labs & imaging results that were available during my care of the patient were reviewed by me and considered in my medical decision making (see chart for details).    Normal CBC and unremarkable urinalysis reassuring. Suspect early diverticulitis.  Begin clear liquid diet and Augmentin. Return tomorrow for KUB. Followup with Family Doctor if  not improved in about 5 days.   Final Clinical Impressions(s) / UC Diagnoses   Final diagnoses:  Abdominal pain, left lower quadrant     Discharge Instructions     Begin clear liquids for about 24 hours, then may begin a BRAT diet (Bananas, Rice, Applesauce, Toast) when abdominal pain improved.  Then gradually advance to a regular diet as tolerated.  May take Tylenol as needed for pain.  If symptoms become significantly worse during the night or over the weekend, proceed to the local emergency room.     ED Prescriptions    Medication Sig Dispense Auth. Provider   amoxicillin-clavulanate (AUGMENTIN) 875-125 MG tablet Take one tab PO Q8hr 15 tablet Kandra Nicolas, MD        Kandra Nicolas, MD 12/28/19 1259

## 2019-12-25 ENCOUNTER — Emergency Department (INDEPENDENT_AMBULATORY_CARE_PROVIDER_SITE_OTHER): Payer: BC Managed Care – PPO

## 2019-12-25 ENCOUNTER — Emergency Department (INDEPENDENT_AMBULATORY_CARE_PROVIDER_SITE_OTHER)
Admission: EM | Admit: 2019-12-25 | Discharge: 2019-12-25 | Disposition: A | Discharge status: Home / Self Care | Payer: BC Managed Care – PPO | Source: Home / Self Care | Attending: Family Medicine | Admitting: Family Medicine

## 2019-12-25 ENCOUNTER — Other Ambulatory Visit: Payer: Self-pay | Admitting: Physician Assistant

## 2019-12-25 DIAGNOSIS — R1032 Left lower quadrant pain: Secondary | ICD-10-CM | POA: Diagnosis not present

## 2019-12-25 DIAGNOSIS — M419 Scoliosis, unspecified: Secondary | ICD-10-CM | POA: Diagnosis not present

## 2019-12-25 DIAGNOSIS — N3281 Overactive bladder: Secondary | ICD-10-CM

## 2019-12-25 IMAGING — DX DG ABDOMEN 2V
2 series · 2 of 2 positions shown · non-contrast
Comparison: None.

CLINICAL DATA: Left lower quadrant abdominal pain for 4 days.

EXAM:
X-RAY ABDOMEN 2 VIEWS

[abdomen erect]
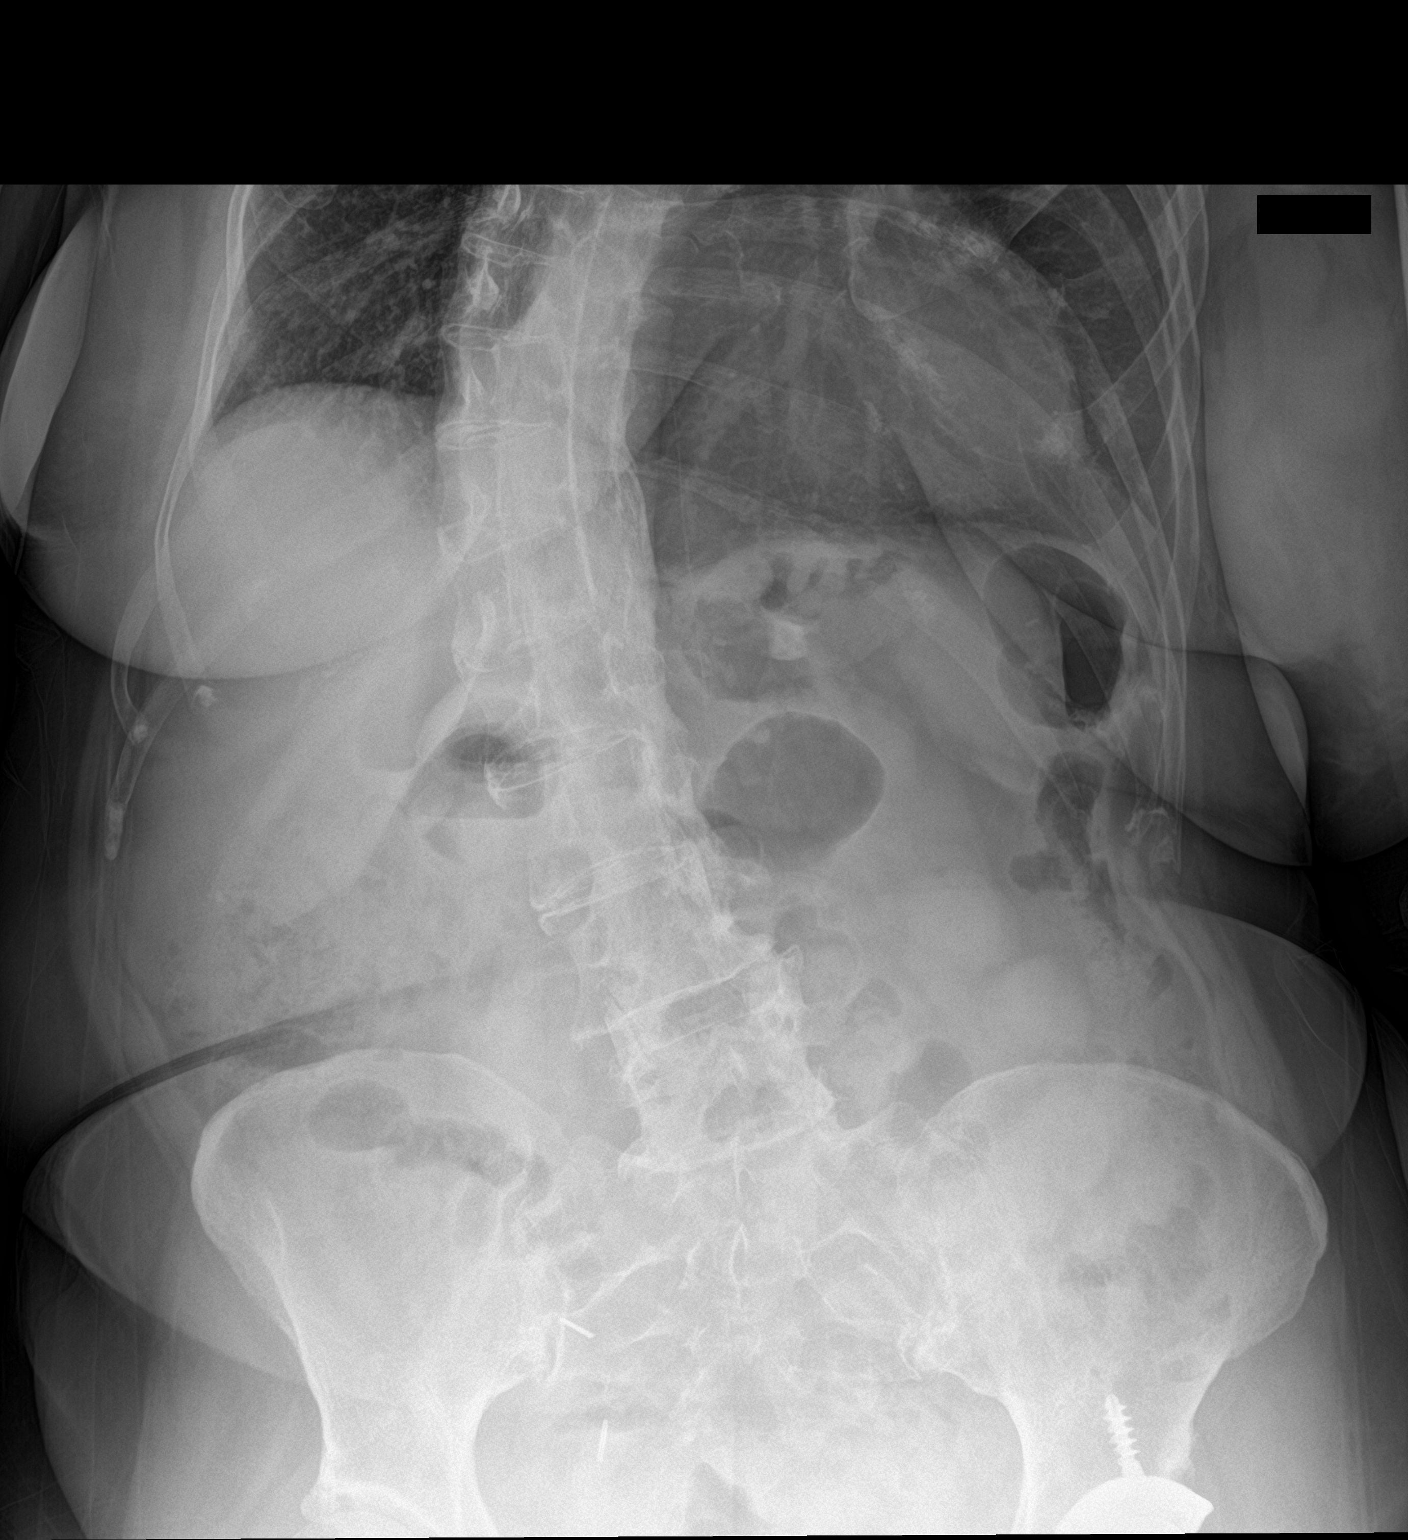

[abdomen supine]
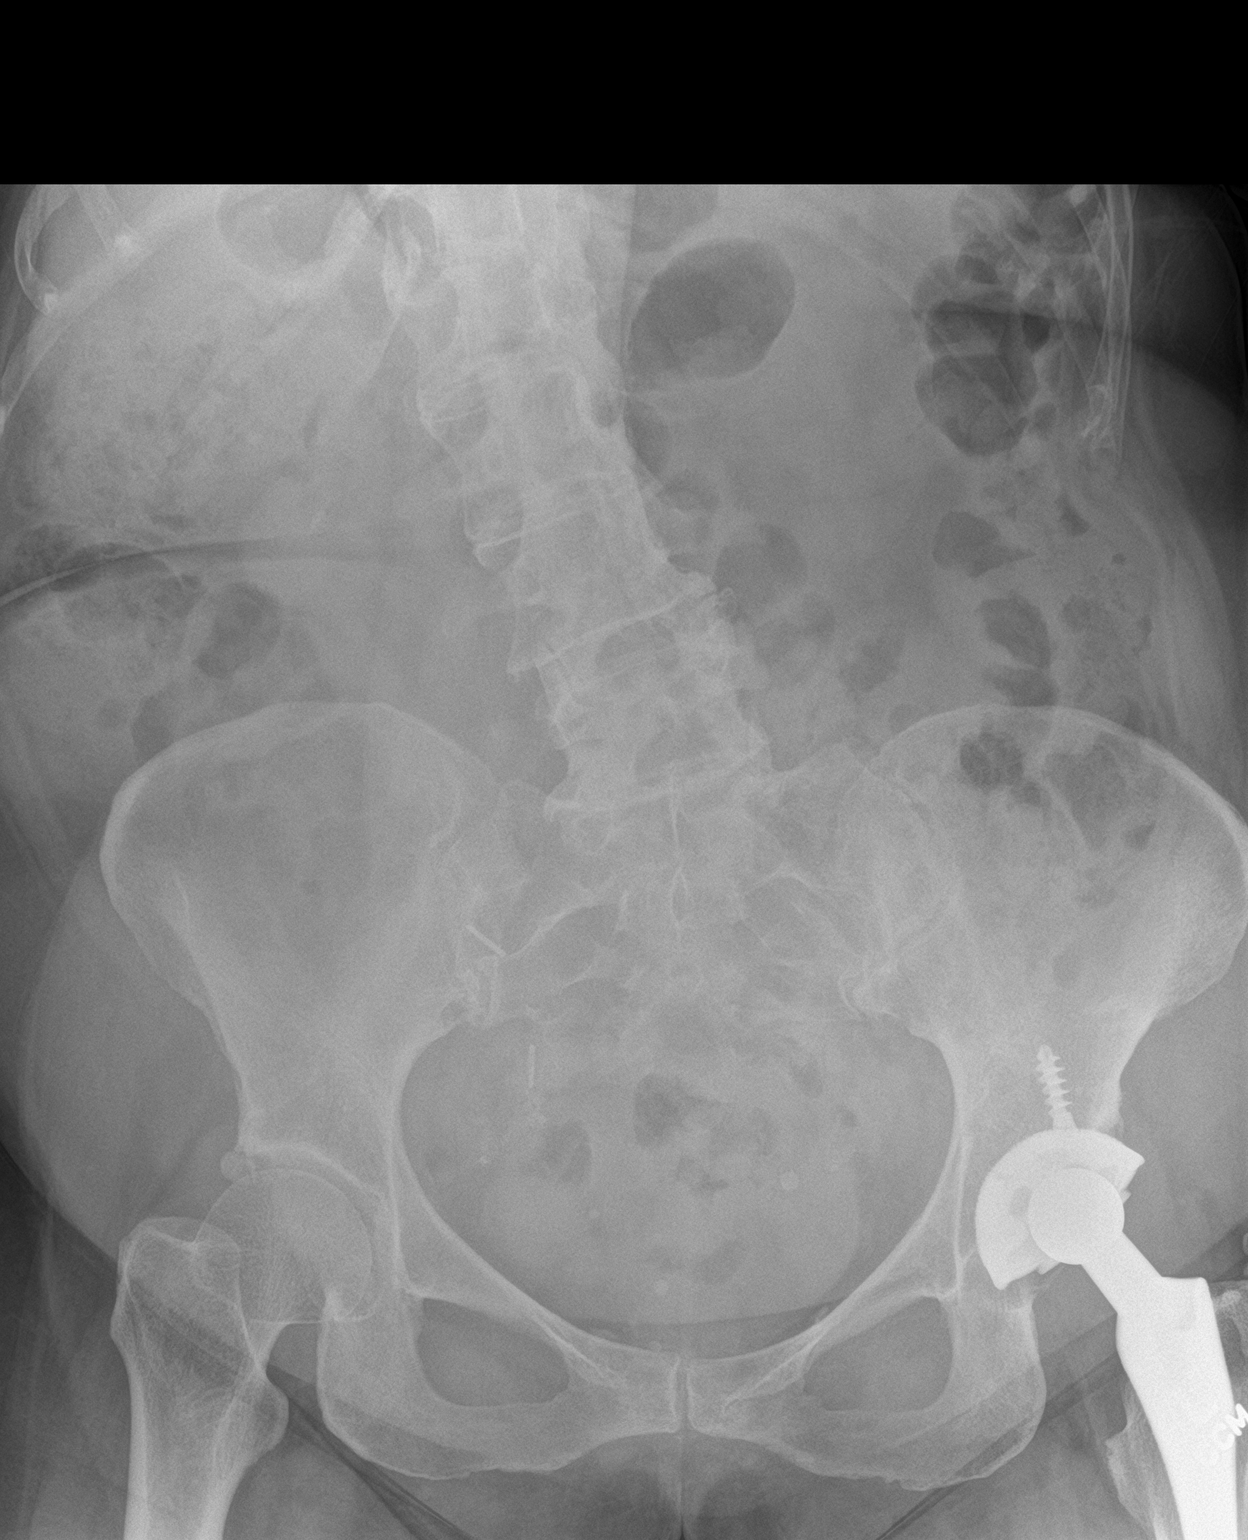

[2 of 2 positions shown; findings below may reference images not displayed]

FINDINGS: The lung bases are clear.

Significant thoracolumbar scoliosis.

The bowel gas pattern is unremarkable. No findings for obstruction
or perforation. The soft tissue shadows are maintained.

No significant bony findings.  Total left hip arthroplasty noted.
IMPRESSION: No acute abdominal findings.

## 2019-12-27 DIAGNOSIS — L821 Other seborrheic keratosis: Secondary | ICD-10-CM | POA: Diagnosis not present

## 2019-12-27 DIAGNOSIS — I872 Venous insufficiency (chronic) (peripheral): Secondary | ICD-10-CM | POA: Diagnosis not present

## 2019-12-27 DIAGNOSIS — L578 Other skin changes due to chronic exposure to nonionizing radiation: Secondary | ICD-10-CM | POA: Diagnosis not present

## 2019-12-27 DIAGNOSIS — L570 Actinic keratosis: Secondary | ICD-10-CM | POA: Diagnosis not present

## 2019-12-28 NOTE — ED Provider Notes (Signed)
Leslie Roberson CARE    CSN: [id number] Arrival date & time: 12/25/19  1154      History   Chief Complaint Presents for X-ray  HPI Leslie Roberson is a 64 y.o. female.   HPI See previous note Past Medical History:  Diagnosis Date  . Anxiety   . Arthritis    "right little finger; base of thumb left hand; spine" (07/07/2017)  . Asthma, chronic 11/27/2015  . Chronic back pain   . Coronary artery disease    4/19 PCI/DEx2 to LAD, and PCI/DES x1 to RCA, normal EF  . Depression   . Diverticulitis of colon 12/21/2015   Colonoscopy every 5 years/digestive health.   . GERD (gastroesophageal reflux disease) 11/27/2015  . History of blood transfusion    "related to spinal fusions"  . Hypothyroidism   . Idiopathic scoliosis 01/22/2016  . Osteoporosis 11/27/2015   On prolia.   . Pneumonia 2016  . Pneumothorax 06/26/2017  . Pre-diabetes   . Thyroid disease     Patient Active Problem List   Diagnosis Date Noted  . 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  . Ingrown right greater toenail 07/19/2019  . Status post hip replacement, left 07/19/2019  . Left hip pain 07/19/2019  . OSA on CPAP 05/20/2019  . Snoring 05/02/2019  . Non-restorative sleep 05/02/2019  . Migraine without aura and without status migrainosus, not intractable 02/08/2019  . OAB (overactive bladder) 02/08/2019  . Diabetes mellitus without complication ([place]) 16/94/5038  . Hyperlipidemia LDL goal <70 11/29/2018  . DJD (degenerative joint disease), lumbosacral 11/29/2018  . Pre-operative clearance 11/29/2018  . Nasal dryness 02/06/2018  . Arthralgia 02/05/2018  . Rhinorrhea 01/04/2018  . Excessive sweating 09/22/2017  . Tongue mass 08/14/2017  . Angina, class III ([place]) 07/07/2017  . CAD S/P percutaneous coronary angioplasty   . Pneumothorax 06/25/2017  . Atypical chest pain 05/24/2017  . Itchy scalp 05/24/2017  . Right leg pain 02/06/2017  . Chronic tension-type headache, intractable 02/05/2017   . Prediabetes 02/05/2017  . Lump in throat 01/27/2017  . Dysphagia 01/27/2017  . No energy 09/15/2016  . Bad taste in mouth 07/26/2016  . Paresthesia 04/03/2016  . Facet hypertrophy of lumbosacral region 04/03/2016  . Chronic back pain 04/03/2016  . Right knee pain 02/19/2016  . Rosacea 02/04/2016  . Vaginal atrophy 01/31/2016  . Idiopathic scoliosis 01/22/2016  . Hip bursitis 12/25/2015  . Biceps tendonitis on right 12/25/2015  . Diverticulitis of colon 12/21/2015  . GERD (gastroesophageal reflux disease) 11/27/2015  . Osteoporosis 11/27/2015  . GAD (generalized anxiety disorder) 11/27/2015  . MDD (major depressive disorder), recurrent, in full remission ([place]) 11/27/2015  . Chronic dryness of both eyes 11/27/2015  . Acquired hypothyroidism 11/27/2015  . Asthma, chronic 11/27/2015  . Insomnia 11/27/2015  . Seborrheic keratoses 11/27/2015    Past Surgical History:  Procedure Laterality Date  . ABDOMINAL HYSTERECTOMY    . bladder mesh     S/P bladder sling"  . CORONARY STENT INTERVENTION N/A 07/07/2017   Procedure: CORONARY STENT INTERVENTION;  Surgeon: Leonie Man, MD;  Location: [place] [facility];  Service: Cardiovascular;  Laterality: N/A;  . ELBOW SURGERY Right    "dislocation"  . INCONTINENCE SURGERY     "didn't work"  . INTRAVASCULAR PRESSURE WIRE/FFR STUDY N/A 07/07/2017   Procedure: INTRAVASCULAR PRESSURE WIRE/FFR STUDY;  Surgeon: Leonie Man, MD;  Location: [place] [facility];  Service: Cardiovascular;  Laterality: N/A;  . JOINT REPLACEMENT    .  LEFT HEART CATH AND CORONARY ANGIOGRAPHY N/A 07/07/2017   Procedure: LEFT HEART CATH AND CORONARY ANGIOGRAPHY;  Surgeon: Leonie Man, MD;  Location: [place] [facility];  Service: Cardiovascular;  Laterality: N/A;  . SPINAL FUSION  ~ 1970 X 3   "below neck - lower back"  . TONSILLECTOMY    . TOTAL HIP ARTHROPLASTY Left     OB History   No obstetric history on file.      Home Medications    Prior to  Admission medications   Medication Sig Start Date End Date Taking? Authorizing Provider  albuterol (PROVENTIL HFA;VENTOLIN HFA) 108 (90 Base) MCG/ACT inhaler Inhale 1-2 puffs into the lungs every 4 (four) hours as needed for wheezing or shortness of breath. 03/04/18   Gregor Hams, MD  amoxicillin-clavulanate (AUGMENTIN) 4191573298 MG tablet Take one tab PO Q8hr 12/24/19   Kandra Nicolas, MD  aspirin EC 81 MG tablet Take 81 mg by mouth at bedtime.    [provider]  atorvastatin (LIPITOR) 20 MG tablet Take 1 tablet (20 mg total) by mouth daily. 02/07/19   Breeback, Jade L, PA-C  busPIRone (BUSPAR) 15 MG tablet Take 1 tablet (15 mg total) by mouth daily. 11/10/17   Merian Capron, MD  calcium-vitamin D (OSCAL WITH D) 500-200 MG-UNIT tablet Take 1 tablet by mouth daily.    [provider]  clonazePAM (KLONOPIN) 0.5 MG tablet TAKE 1/2 TABLET BY MOUTH TWICE A DAY AS NEEDED FOR ANXIETY 06/15/18   [provider]  denosumab (PROLIA) 60 MG/ML SOLN injection Inject 60 mg into the skin every 6 (six) months. Administer in upper arm, thigh, or abdomen    [provider]  desvenlafaxine (PRISTIQ) 100 MG 24 hr tablet Take 100 mg by mouth at bedtime 11/10/17   Merian Capron, MD  diclofenac sodium (VOLTAREN) 1 % GEL APPLY 4G TOPICALLY 4 TIMES DAILY 02/03/18   Breeback, Jade L, PA-C  gabapentin (NEURONTIN) 300 MG capsule Patient take 1 in the am and 2 at bedtime 06/16/19   Breeback, Jade L, PA-C  levothyroxine (SYNTHROID) 137 MCG tablet TAKE 1/2 TABLET DAILY 5 DAYS A WEEK AND 1 TABLET DAILY 2 DAYS A WEEK. 02/07/19   Breeback, Jade L, PA-C  lidocaine (LIDODERM) 5 % Place 1 patch onto the skin daily. Remove & Discard patch within 12 hours or as directed by MD 06/11/17   Gregor Hams, MD  Menthol, Topical Analgesic, (MINERAL ICE EX) Apply 1 application to affected sites one to two times a day as needed (for pain)    [provider]  metFORMIN (GLUCOPHAGE-XR) 500 MG 24 hr tablet  Take 1 tablet (500 mg total) by mouth daily with breakfast. Needs appt 10/19/19   Iran Planas L, PA-C  methylPREDNISolone (MEDROL DOSEPAK) 4 MG TBPK tablet Take as directed by package insert. 11/30/19   Breeback, Jade L, PA-C  metoprolol succinate (TOPROL-XL) 25 MG 24 hr tablet TAKE 1 TABLET (25 MG TOTAL) BY MOUTH DAILY. PLEASE KEEP UPCOMING APPT FOR REFILLS 08/16/19   Lelon Perla, MD  MYRBETRIQ 50 MG TB24 tablet TAKE 1 TABLET BY MOUTH EVERY DAY 12/26/19   Breeback, Jade L, PA-C  nitroGLYCERIN (NITROSTAT) 0.4 MG SL tablet PLACE 1 TABLET (0.4 MG TOTAL) UNDER THE TONGUE EVERY 5 (FIVE) MINUTES AS NEEDED. 09/28/17   Lelon Perla, MD  Omega-3 Fatty Acids (FISH OIL PO) Take 1 capsule by mouth daily.     [provider]  pantoprazole (PROTONIX) 40 MG tablet  TAKE 1 TABLET BY MOUTH EVERY DAY 11/07/19   Breeback, Jade L, PA-C  REXULTI 1 MG TABS Take 1 tablet by mouth daily.  07/16/18   [provider]  tamsulosin (FLOMAX) 0.4 MG CAPS capsule Take 1 capsule (0.4 mg total) by mouth daily. For next 15-30 days. 02/23/19   Breeback, Jade L, PA-C  topiramate (TOPAMAX) 50 MG tablet Take 1 tablet (50 mg total) by mouth 2 (two) times daily. 02/07/19   Breeback, Jade L, PA-C  triamcinolone ointment (KENALOG) 0.1 % Apply 1 application topically 2 (two) times daily. To affected area(s) as needed, sparing use to avoid whitening/thinning skin 09/30/19   Early, Coralee Pesa, NP  FLUoxetine (PROZAC) 10 MG capsule Take 1 capsule (10 mg total) by mouth daily. 10/13/17 11/10/17  Merian Capron, MD    Family History Family History  Problem Relation Age of Onset  . Cancer Mother        lung  . Depression Mother   . Heart attack Father   . Hyperlipidemia Father   . Hypertension Father   . Diabetes Maternal Aunt   . Hyperlipidemia Paternal Aunt   . COPD Paternal Aunt     Social History Social History   Tobacco Use  . Smoking status: Never Smoker  . Smokeless tobacco: Never Used  Vaping Use  . Vaping  Use: Never used  Substance Use Topics  . Alcohol use: Yes    Comment: 07/07/2017 "5-6 times/year; 1 drink at each occasion"  . Drug use: No     Allergies   Hydromorphone, Levofloxacin, Meperidine, Broccoli [brassica oleracea], and Adhesive [tape]   Review of Systems Review of Systems Patient reports that she feels somewhat better.  Physical Exam Triage Vital Signs ED Triage Vitals  Enc Vitals Group     BP      Pulse      Resp      Temp      Temp src      SpO2      Weight      Height      Head Circumference      Peak Flow      Pain Score      Pain Loc      Pain Edu?      Excl. in [place]?    No data found.  Updated Vital Signs There were no vitals taken for this visit.  Visual Acuity Right Eye Distance:   Left Eye Distance:   Bilateral Distance:    Right Eye Near:   Left Eye Near:    Bilateral Near:     Physical Exam Patient not examined.  UC Treatments / Results  Labs (all labs ordered are listed, but only abnormal results are displayed) Labs Reviewed - No data to display  EKG   Radiology Narrative & Impression  CLINICAL DATA:  Left lower quadrant abdominal pain for 4 days.  EXAM: X-RAY ABDOMEN 2 VIEWS  COMPARISON:  None.  FINDINGS: The lung bases are clear.  Significant thoracolumbar scoliosis.  The bowel gas pattern is unremarkable. No findings for obstruction or perforation. The soft tissue shadows are maintained.  No significant bony findings.  Total left hip arthroplasty noted.  IMPRESSION: No acute abdominal findings.   Electronically Signed   By: Marijo Sanes M.D.   On: 12/25/2019 13:03     Procedures Procedures (including critical care time)  Medications Ordered in UC Medications - No data to display  Initial Impression / Assessment and Plan /  UC Course  I have reviewed the triage vital signs and the nursing notes.  Pertinent labs & imaging results that were available during my care of the patient were  reviewed by me and considered in my medical decision making (see chart for details).    Negative KUB reassuring Continue Augmentin.  Final Clinical Impressions(s) / UC Diagnoses   Final diagnoses:  Left lower quadrant abdominal pain   Discharge Instructions   Continue Augmenting    ED Prescriptions    None        Kandra Nicolas, MD 12/28/19 1305

## 2019-12-30 ENCOUNTER — Ambulatory Visit (INDEPENDENT_AMBULATORY_CARE_PROVIDER_SITE_OTHER): Payer: BC Managed Care – PPO | Admitting: Physician Assistant

## 2019-12-30 VITALS — BP 106/62 | HR 72 | Ht <= 58 in | Wt 126.0 lb

## 2019-12-30 DIAGNOSIS — K5732 Diverticulitis of large intestine without perforation or abscess without bleeding: Secondary | ICD-10-CM | POA: Diagnosis not present

## 2019-12-30 DIAGNOSIS — Z9861 Coronary angioplasty status: Secondary | ICD-10-CM

## 2019-12-30 DIAGNOSIS — I2510 Atherosclerotic heart disease of native coronary artery without angina pectoris: Secondary | ICD-10-CM | POA: Diagnosis not present

## 2019-12-30 DIAGNOSIS — E039 Hypothyroidism, unspecified: Secondary | ICD-10-CM | POA: Diagnosis not present

## 2019-12-30 DIAGNOSIS — E118 Type 2 diabetes mellitus with unspecified complications: Secondary | ICD-10-CM

## 2019-12-30 LAB — POCT GLYCOSYLATED HEMOGLOBIN (HGB A1C): Hemoglobin A1C: 6.1 % — AB (ref 4.0–5.6)

## 2019-12-30 MED ORDER — METFORMIN HCL ER 500 MG PO TB24: 500.0000 mg | ORAL_TABLET | Freq: Every day | ORAL | 1 refills | Status: DC

## 2019-12-30 NOTE — Patient Instructions (Addendum)
Diverticulitis  Diverticulitis is when small pockets in your large intestine (colon) get infected or swollen. This causes stomach pain and watery poop (diarrhea). These pouches are called diverticula. They form in people who have a condition called diverticulosis. Follow these instructions at home: Medicines  Take over-the-counter and prescription medicines only as told by your doctor. These include: ? Antibiotics. ? Pain medicines. ? Fiber pills. ? Probiotics. ? Stool softeners.  Do not drive or use heavy machinery while taking prescription pain medicine.  If you were prescribed an antibiotic, take it as told. Do not stop taking it even if you feel better. General instructions   Follow a diet as told by your doctor.  When you feel better, your doctor may tell you to change your diet. You may need to eat a lot of fiber. Fiber makes it easier to poop (have bowel movements). Healthy foods with fiber include: ? Berries. ? Beans. ? Lentils. ? Green vegetables.  Exercise 3 or more times a week. Aim for 30 minutes each time. Exercise enough to sweat and make your heart beat faster.  Keep all follow-up visits as told. This is important. You may need to have an exam of the large intestine. This is called a colonoscopy. Contact a doctor if:  Your pain does not get better.  You have a hard time eating or drinking.  You are not pooping like normal. Get help right away if:  Your pain gets worse.  Your problems do not get better.  Your problems get worse very fast.  You have a fever.  You throw up (vomit) more than one time.  You have poop that is: ? Bloody. ? Black. ? Tarry. Summary  Diverticulitis is when small pockets in your large intestine (colon) get infected or swollen.  Take medicines only as told by your doctor.  Follow a diet as told by your doctor. This information is not intended to replace advice given to you by your health care provider. Make sure you  discuss any questions you have with your health care provider. Document Revised: 02/27/2017 Document Reviewed: 04/03/2016 Elsevier Patient Education  [place]. Chronic Venous Insufficiency Chronic venous insufficiency is a condition where the leg veins cannot effectively pump blood from the legs to the heart. This happens when the vein walls are either stretched, weakened, or damaged, or when the valves inside the vein are damaged. With the right treatment, you should be able to continue with an active life. This condition is also called venous stasis. What are the causes? Common causes of this condition include:  High blood pressure inside the veins (venous hypertension).  Sitting or standing too long, causing increased blood pressure in the leg veins.  A blood clot that blocks blood flow in a vein (deep vein thrombosis, DVT).  Inflammation of a vein (phlebitis) that causes a blood clot to form.  Tumors in the pelvis that cause blood to back up. What increases the risk? The following factors may make you more likely to develop this condition:  Having a family history of this condition.  Obesity.  Pregnancy.  Living without enough regular physical activity or exercise (sedentary lifestyle).  Smoking.  Having a job that requires long periods of standing or sitting in one place.  Being a certain age. Women in their 52s and 40s and men in their 76s are more likely to develop this condition. What are the signs or symptoms? Symptoms of this condition include:  Veins that are enlarged,  bulging, or twisted (varicose veins).  Skin breakdown or ulcers.  Reddened skin or dark discoloration of skin on the leg between the knee and ankle.  Brown, smooth, tight, and painful skin just above the ankle, usually on the inside of the leg (lipodermatosclerosis).  Swelling of the legs. How is this diagnosed? This condition may be diagnosed based on:  Your medical history.  A  physical exam.  Tests, such as: ? A procedure that creates an image of a blood vessel and nearby organs and provides information about blood flow through the blood vessel (duplex ultrasound). ? A procedure that tests blood flow (plethysmography). ? A procedure that looks at the veins using X-ray and dye (venogram). How is this treated? The goals of treatment are to help you return to an active life and to minimize pain or disability. Treatment depends on the severity of your condition, and it may include:  Wearing compression stockings. These can help relieve symptoms and help prevent your condition from getting worse. However, they do not cure the condition.  Sclerotherapy. This procedure involves an injection of a solution that shrinks damaged veins.  Surgery. This may involve: ? Removing a diseased vein (vein stripping). ? Cutting off blood flow through the vein (laser ablation surgery). ? Repairing or reconstructing a valve within the affected vein. Follow these instructions at home:      Wear compression stockings as told by your health care provider. These stockings help to prevent blood clots and reduce swelling in your legs.  Take over-the-counter and prescription medicines only as told by your health care provider.  Stay active by exercising, walking, or doing different activities. Ask your health care provider what activities are safe for you and how much exercise you need.  Drink enough fluid to keep your urine pale yellow.  Do not use any products that contain nicotine or tobacco, such as cigarettes, e-cigarettes, and chewing tobacco. If you need help quitting, ask your health care provider.  Keep all follow-up visits as told by your health care provider. This is important. Contact a health care provider if you:  Have redness, swelling, or more pain in the affected area.  See a red streak or line that goes up or down from the affected area.  Have skin breakdown or  skin loss in the affected area, even if the breakdown is small.  Get an injury in the affected area. Get help right away if:  You get an injury and an open wound in the affected area.  You have: ? Severe pain that does not get better with medicine. ? Sudden numbness or weakness in the foot or ankle below the affected area. ? Trouble moving your foot or ankle. ? A fever. ? Worse or persistent symptoms. ? Chest pain. ? Shortness of breath. Summary  Chronic venous insufficiency is a condition where the leg veins cannot effectively pump blood from the legs to the heart.  Chronic venous insufficiency occurs when the vein walls become stretched, weakened, or damaged, or when valves within the vein are damaged.  Treatment depends on how severe your condition is. It often involves wearing compression stockings and may involve having a procedure.  Make sure you stay active by exercising, walking, or doing different activities. Ask your health care provider what activities are safe for you and how much exercise you need. This information is not intended to replace advice given to you by your health care provider. Make sure you discuss any questions you have  with your health care provider. Document Revised: 12/08/2017 Document Reviewed: 12/08/2017 Elsevier Patient Education  [place].

## 2019-12-30 NOTE — Progress Notes (Signed)
Subjective:    Patient ID: Leslie Roberson, female    DOB: [birth year], 64 y.o.   MRN: [id number]  HPI  Pt is a 64 yo female with T2DM, hypothyroidism, CAD, migraines who presents to the clinic for 3 month follow up.   Pt did just go to UC on 12/23/2019 and treated for diverticulitis. She feels much better. That was her first episode.   Migraines are controlled. She is tapering off topamax.   Not checking sugars. No hypoglycemic events. No open sores or wounds. Taking metfromin daily.   Have some intermittent bilateral feet and ankle swelling. No pain. Better in am. No injuries. No SOB.  Marland Kitchen. Active Ambulatory Problems    Diagnosis Date Noted  . GERD (gastroesophageal reflux disease) 11/27/2015  . Osteoporosis 11/27/2015  . GAD (generalized anxiety disorder) 11/27/2015  . MDD (major depressive disorder), recurrent, in full remission ([place]) 11/27/2015  . Chronic dryness of both eyes 11/27/2015  . Acquired hypothyroidism 11/27/2015  . Asthma, chronic 11/27/2015  . Insomnia 11/27/2015  . Seborrheic keratoses 11/27/2015  . Diverticulitis of colon 12/21/2015  . Hip bursitis 12/25/2015  . Biceps tendonitis on right 12/25/2015  . Idiopathic scoliosis 01/22/2016  . Vaginal atrophy 01/31/2016  . Rosacea 02/04/2016  . Right knee pain 02/19/2016  . Paresthesia 04/03/2016  . Facet hypertrophy of lumbosacral region 04/03/2016  . Chronic back pain 04/03/2016  . Bad taste in mouth 07/26/2016  . No energy 09/15/2016  . Lump in throat 01/27/2017  . Dysphagia 01/27/2017  . Chronic tension-type headache, intractable 02/05/2017  . Prediabetes 02/05/2017  . Right leg pain 02/06/2017  . Atypical chest pain 05/24/2017  . Itchy scalp 05/24/2017  . Pneumothorax 06/25/2017  . Angina, class III ([place]) 07/07/2017  . CAD S/P percutaneous coronary angioplasty   . Tongue mass 08/14/2017  . Excessive sweating 09/22/2017  . Rhinorrhea 01/04/2018  . Arthralgia 02/05/2018  . Nasal dryness 02/06/2018   . Hyperlipidemia LDL goal <70 11/29/2018  . DJD (degenerative joint disease), lumbosacral 11/29/2018  . Pre-operative clearance 11/29/2018  . Diabetes mellitus without complication ([name]) 40/98/1191  . Migraine without aura and without status migrainosus, not intractable 02/08/2019  . OAB (overactive bladder) 02/08/2019  . Snoring 05/02/2019  . Non-restorative sleep 05/02/2019  . OSA on CPAP 05/20/2019  . Ingrown right greater toenail 07/19/2019  . Status post hip replacement, left 07/19/2019  . Left hip pain 07/19/2019  . 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   Resolved Ambulatory Problems    Diagnosis Date Noted  . Acute medial meniscus tear 04/23/2016  . Left knee pain 04/28/2016  . Mild intermittent asthma without complication 47/82/9562   Past Medical History:  Diagnosis Date  . Anxiety   . Arthritis   . Coronary artery disease   . Depression   . History of blood transfusion   . Hypothyroidism   . Pneumonia 2016  . Pre-diabetes   . Thyroid disease      Review of Systems  All other systems reviewed and are negative.      Objective:   Physical Exam Vitals reviewed.  Constitutional:      Appearance: Normal appearance.  HENT:     Head: Normocephalic.  Cardiovascular:     Rate and Rhythm: Normal rate and regular rhythm.     Pulses: Normal pulses.  Pulmonary:     Effort: Pulmonary effort is normal.  Neurological:     General: No focal deficit present.     Mental Status: She is  alert and oriented to person, place, and time.  Psychiatric:        Mood and Affect: Mood normal.       .. Lab Results  Component Value Date   HGBA1C 6.1 (A) 12/30/2019       Assessment & Plan:  Marland KitchenMarland KitchenLama was seen today for diverticulitis.  Diagnoses and all orders for this visit:  Controlled type 2 diabetes mellitus with complication, without long-term current use of insulin (HCC) -     POCT glycosylated hemoglobin (Hb A1C) -     metFORMIN (GLUCOPHAGE-XR) 500  MG 24 hr tablet; Take 1 tablet (500 mg total) by mouth daily with breakfast.  Acquired hypothyroidism  CAD S/P percutaneous coronary angioplasty  Diverticulitis of colon   A1C controlled.  Continue on metformin.  Foot exam UTD.  On statin.  BP to goal.  Flu/covid/pneumonia UTD.   Ok to transitiion off topamax for migraines.   TSH UTD. Pt on medication.   Discussed diverticulitis prevention with diet. Follow up with future flares.     Reminder she is due for her covid booster in November.   Follow up in 3 months.

## 2020-01-04 ENCOUNTER — Other Ambulatory Visit: Payer: Self-pay | Admitting: Physician Assistant

## 2020-01-04 DIAGNOSIS — G43009 Migraine without aura, not intractable, without status migrainosus: Secondary | ICD-10-CM

## 2020-01-06 ENCOUNTER — Other Ambulatory Visit: Payer: Self-pay | Admitting: Physician Assistant

## 2020-01-06 DIAGNOSIS — Z1231 Encounter for screening mammogram for malignant neoplasm of breast: Secondary | ICD-10-CM

## 2020-01-09 ENCOUNTER — Encounter: Payer: Self-pay | Admitting: Physician Assistant

## 2020-01-09 LAB — POCT CBC W AUTO DIFF (K'VILLE URGENT CARE)

## 2020-01-19 ENCOUNTER — Ambulatory Visit (INDEPENDENT_AMBULATORY_CARE_PROVIDER_SITE_OTHER): Payer: BC Managed Care – PPO | Admitting: Orthopaedic Surgery

## 2020-01-19 ENCOUNTER — Ambulatory Visit: Payer: Self-pay

## 2020-01-19 DIAGNOSIS — Z96642 Presence of left artificial hip joint: Secondary | ICD-10-CM | POA: Diagnosis not present

## 2020-01-19 DIAGNOSIS — M25552 Pain in left hip: Secondary | ICD-10-CM | POA: Diagnosis not present

## 2020-01-19 NOTE — Progress Notes (Signed)
Consult Note   Patient: Leslie Roberson             Date of Birth: [birth year]           MRN: [id number]             Visit Date: 01/19/2020  Requested by: Donella Stade, PA-C [address] [place],  [place] [id number] PCP: Donella Stade, [place]  Thank you for asking me to evaluate this patient for Pain of the Left Hip  Below are my findings.     Assessment & Plan: Visit Diagnoses:  1. Pain in left hip   2. History of total left hip replacement     Plan: At some point it is essential that we find out the type of components that she has because she will eventually need a polyliner and hip ball exchange.  Right now she is asymptomatic.  Her husband will look into where she had her hip replaced in [place] to see if they can find out what the components are.  I can see her back in 1 year for repeat AP pelvis and lateral of the left hip.  If there is any issues before then they will let us know.  Follow Up Instructions: Return in about 1 year (around 01/18/2021).  Orders: Orders Placed This Encounter  Procedures  . XR HIP UNILAT W OR W/O PELVIS 2-3 VIEWS LEFT   No orders of the defined types were placed in this encounter.    Procedures: No procedures performed   Clinical Data: No additional findings.   Subjective:  Chief Complaint  Patient presents with  . Left Hip - Pain  The patient is a 64 year old female that I have seen before.  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  She then had that revised with a polyliner exchange in 2009.  She has developed some pain around her left hip.  I saw her back in April of this year and that was more of a bursitis type of pain.  That is since resolved.  She does get spasming and cramping in the back of her thigh.  She does have known significant scoliosis of her lumbar spine.  She denies any groin pain on that left side.  She denies hip pain with  weightbearing.  HPI  Review of Systems There is currently listed no headache, chest pain, shortness of breath, fever, chills, nausea, vomiting  Objective: Vital Signs: There were no vitals taken for this visit.  Physical Exam  She is alert and orient x3 and in no acute distress Ortho Exam  Examination of her left hip shows that it moves smoothly and fluidly with no pain in the groin at all.  There is no pain when I compress or stressing the left hip. Specialty Comments:  No specialty comments available.  Imaging:  XR HIP UNILAT W OR W/O PELVIS 2-3 VIEWS LEFT  Result Date: 01/19/2020 An AP pelvis and lateral left hip shows a well-seated total hip arthroplasty on the left side.  There is evidence of polyliner wear in terms of the hip bolt placement within the socket being a little more high riding.  There is no evidence of osteolysis around the components themselves.    PMFS History: Patient Active Problem List   Diagnosis Date Noted  . 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  . Ingrown right greater toenail 07/19/2019  . Status post  hip replacement, left 07/19/2019  . Left hip pain 07/19/2019  . OSA on CPAP 05/20/2019  . Snoring 05/02/2019  . Non-restorative sleep 05/02/2019  . Migraine without aura and without status migrainosus, not intractable 02/08/2019  . OAB (overactive bladder) 02/08/2019  . Diabetes mellitus without complication ([employer]) 36/14/4315  . Hyperlipidemia LDL goal <70 11/29/2018  . DJD (degenerative joint disease), lumbosacral 11/29/2018  . Pre-operative clearance 11/29/2018  . Nasal dryness 02/06/2018  . Arthralgia 02/05/2018  . Rhinorrhea 01/04/2018  . Excessive sweating 09/22/2017  . Tongue mass 08/14/2017  . Angina, class III ([place]) 07/07/2017  . CAD S/P percutaneous coronary angioplasty   . Pneumothorax 06/25/2017  . Atypical chest pain 05/24/2017  . Itchy scalp 05/24/2017  . Right leg pain 02/06/2017  . Chronic tension-type headache,  intractable 02/05/2017  . Prediabetes 02/05/2017  . Lump in throat 01/27/2017  . Dysphagia 01/27/2017  . No energy 09/15/2016  . Bad taste in mouth 07/26/2016  . Paresthesia 04/03/2016  . Facet hypertrophy of lumbosacral region 04/03/2016  . Chronic back pain 04/03/2016  . Right knee pain 02/19/2016  . Rosacea 02/04/2016  . Vaginal atrophy 01/31/2016  . Idiopathic scoliosis 01/22/2016  . Hip bursitis 12/25/2015  . Biceps tendonitis on right 12/25/2015  . Diverticulitis of colon 12/21/2015  . GERD (gastroesophageal reflux disease) 11/27/2015  . Osteoporosis 11/27/2015  . GAD (generalized anxiety disorder) 11/27/2015  . MDD (major depressive disorder), recurrent, in full remission ([place]) 11/27/2015  . Chronic dryness of both eyes 11/27/2015  . Acquired hypothyroidism 11/27/2015  . Asthma, chronic 11/27/2015  . Insomnia 11/27/2015  . Seborrheic keratoses 11/27/2015   Past Medical History:  Diagnosis Date  . Anxiety   . Arthritis    "right little finger; base of thumb left hand; spine" (07/07/2017)  . Asthma, chronic 11/27/2015  . Chronic back pain   . Coronary artery disease    4/19 PCI/DEx2 to LAD, and PCI/DES x1 to RCA, normal EF  . Depression   . Diverticulitis of colon 12/21/2015   Colonoscopy every 5 years/digestive health.   . GERD (gastroesophageal reflux disease) 11/27/2015  . History of blood transfusion    "related to spinal fusions"  . Hypothyroidism   . Idiopathic scoliosis 01/22/2016  . Osteoporosis 11/27/2015   On prolia.   . Pneumonia 2016  . Pneumothorax 06/26/2017  . Pre-diabetes   . Thyroid disease     Family History  Problem Relation Age of Onset  . Cancer Mother        lung  . Depression Mother   . Heart attack Father   . Hyperlipidemia Father   . Hypertension Father   . Diabetes Maternal Aunt   . Hyperlipidemia Paternal Aunt   . COPD Paternal Aunt    Past Surgical History:  Procedure Laterality Date  . ABDOMINAL HYSTERECTOMY    . bladder  mesh     S/P bladder sling"  . CORONARY STENT INTERVENTION N/A 07/07/2017   Procedure: CORONARY STENT INTERVENTION;  Surgeon: Leonie Man, MD;  Location: [facility];  Service: Cardiovascular;  Laterality: N/A;  . ELBOW SURGERY Right    "dislocation"  . INCONTINENCE SURGERY     "didn't work"  . INTRAVASCULAR PRESSURE WIRE/FFR STUDY N/A 07/07/2017   Procedure: INTRAVASCULAR PRESSURE WIRE/FFR STUDY;  Surgeon: Leonie Man, MD;  Location: [facility];  Service: Cardiovascular;  Laterality: N/A;  . JOINT REPLACEMENT    . LEFT HEART CATH AND CORONARY ANGIOGRAPHY N/A 07/07/2017   Procedure: LEFT  HEART CATH AND CORONARY ANGIOGRAPHY;  Surgeon: Leonie Man, MD;  Location: [facility];  Service: Cardiovascular;  Laterality: N/A;  . SPINAL FUSION  ~ 1970 X 3   "below neck - lower back"  . TONSILLECTOMY    . TOTAL HIP ARTHROPLASTY Left    Social History   Occupational History  . Not on file  Tobacco Use  . Smoking status: Never Smoker  . Smokeless tobacco: Never Used  Vaping Use  . Vaping Use: Never used  Substance and Sexual Activity  . Alcohol use: Yes    Comment: 07/07/2017 "5-6 times/year; 1 drink at each occasion"  . Drug use: No  . Sexual activity: Yes

## 2020-01-23 ENCOUNTER — Ambulatory Visit (INDEPENDENT_AMBULATORY_CARE_PROVIDER_SITE_OTHER): Payer: BC Managed Care – PPO | Admitting: Nurse Practitioner

## 2020-01-23 ENCOUNTER — Other Ambulatory Visit: Payer: Self-pay

## 2020-01-23 ENCOUNTER — Encounter: Payer: Self-pay | Admitting: Nurse Practitioner

## 2020-01-23 VITALS — BP 132/73 | HR 64 | Temp 97.5°F | Ht <= 58 in | Wt 127.2 lb

## 2020-01-23 DIAGNOSIS — H1033 Unspecified acute conjunctivitis, bilateral: Secondary | ICD-10-CM | POA: Diagnosis not present

## 2020-01-23 MED ORDER — AZELASTINE HCL 0.05 % OP SOLN: 2.0000 [drp] | Freq: Two times a day (BID) | OPHTHALMIC | 11 refills | Status: DC

## 2020-01-23 MED ORDER — AZITHROMYCIN 1 % OP SOLN: 1.0000 [drp] | Freq: Two times a day (BID) | OPHTHALMIC | 0 refills | Status: DC

## 2020-01-23 NOTE — Progress Notes (Signed)
Acute Office Visit  Subjective:    Patient ID: Leslie Roberson, female    DOB: 1955/11/06, 64 y.o.   MRN: [id number]  Chief Complaint  Patient presents with  . Eye Drainage    bilateral eye drainage at night, eye crusting in the mornings    HPI Patient is in today for drainage from her eyes bilaterally that has been going on for about a week. She reports that at night her eyes drain and when she wakes in the morning she has a crust on her eyelids and lashes that must be washed away. She is unsure the color of the drainage. She endorses mild redness and gritty sensation in the eyes.   She denies any sinus, allergy, or upper respiratory symptoms. She denies fever, chills, vision changes, headaches, or pain in her eyes.   She denies any new pets, furniture, or bedding that could contribute to allergy symptoms.   Past Medical History:  Diagnosis Date  . Anxiety   . Arthritis    "right little finger; base of thumb left hand; spine" (07/07/2017)  . Asthma, chronic 11/27/2015  . Chronic back pain   . Coronary artery disease    4/19 PCI/DEx2 to LAD, and PCI/DES x1 to RCA, normal EF  . Depression   . Diverticulitis of colon 12/21/2015   Colonoscopy every 5 years/digestive health.   . GERD (gastroesophageal reflux disease) 11/27/2015  . History of blood transfusion    "related to spinal fusions"  . Hypothyroidism   . Idiopathic scoliosis 01/22/2016  . Osteoporosis 11/27/2015   On prolia.   . Pneumonia 2016  . Pneumothorax 06/26/2017  . Pre-diabetes   . Thyroid disease     Past Surgical History:  Procedure Laterality Date  . ABDOMINAL HYSTERECTOMY    . bladder mesh     S/P bladder sling"  . CORONARY STENT INTERVENTION N/A 07/07/2017   Procedure: CORONARY STENT INTERVENTION;  Surgeon: Leonie Man, MD;  Location: [facility];  Service: Cardiovascular;  Laterality: N/A;  . ELBOW SURGERY Right    "dislocation"  . INCONTINENCE SURGERY     "didn't work"  . INTRAVASCULAR  PRESSURE WIRE/FFR STUDY N/A 07/07/2017   Procedure: INTRAVASCULAR PRESSURE WIRE/FFR STUDY;  Surgeon: Leonie Man, MD;  Location: [facility];  Service: Cardiovascular;  Laterality: N/A;  . JOINT REPLACEMENT    . LEFT HEART CATH AND CORONARY ANGIOGRAPHY N/A 07/07/2017   Procedure: LEFT HEART CATH AND CORONARY ANGIOGRAPHY;  Surgeon: Leonie Man, MD;  Location: [facility];  Service: Cardiovascular;  Laterality: N/A;  . SPINAL FUSION  ~ 1970 X 3   "below neck - lower back"  . TONSILLECTOMY    . TOTAL HIP ARTHROPLASTY Left     Family History  Problem Relation Age of Onset  . Cancer Mother        lung  . Depression Mother   . Heart attack Father   . Hyperlipidemia Father   . Hypertension Father   . Diabetes Maternal Aunt   . Hyperlipidemia Paternal Aunt   . COPD Paternal Aunt     Social History   Socioeconomic History  . Marital status: Married    Spouse name: Not on file  . Number of children: 1  . Years of education: Not on file  . Highest education level: Not on file  Occupational History  . Not on file  Tobacco Use  . Smoking status: Never Smoker  . Smokeless tobacco: Never  Used  Vaping Use  . Vaping Use: Never used  Substance and Sexual Activity  . Alcohol use: Yes    Comment: 07/07/2017 "5-6 times/year; 1 drink at each occasion"  . Drug use: No  . Sexual activity: Yes  Other Topics Concern  . Not on file  Social History Narrative  . Not on file   Social Determinants of Health   Financial Resource Strain:   . Difficulty of Paying Living Expenses: Not on file  Food Insecurity:   . Worried About Charity fundraiser in the Last Year: Not on file  . Ran Out of Food in the Last Year: Not on file  Transportation Needs:   . Lack of Transportation (Medical): Not on file  . Lack of Transportation (Non-Medical): Not on file  Physical Activity:   . Days of Exercise per Week: Not on file  . Minutes of Exercise per Session: Not on file  Stress:   .  Feeling of Stress : Not on file  Social Connections:   . Frequency of Communication with Friends and Family: Not on file  . Frequency of Social Gatherings with Friends and Family: Not on file  . Attends Religious Services: Not on file  . Active Member of Clubs or Organizations: Not on file  . Attends Archivist Meetings: Not on file  . Marital Status: Not on file  Intimate Partner Violence:   . Fear of Current or Ex-Partner: Not on file  . Emotionally Abused: Not on file  . Physically Abused: Not on file  . Sexually Abused: Not on file    Outpatient Medications Prior to Visit  Medication Sig Dispense Refill  . albuterol (PROVENTIL HFA;VENTOLIN HFA) 108 (90 Base) MCG/ACT inhaler Inhale 1-2 puffs into the lungs every 4 (four) hours as needed for wheezing or shortness of breath. 1 Inhaler 3  . aspirin EC 81 MG tablet Take 81 mg by mouth at bedtime.    Marland Kitchen atorvastatin (LIPITOR) 20 MG tablet Take 1 tablet (20 mg total) by mouth daily. 90 tablet 3  . busPIRone (BUSPAR) 15 MG tablet Take 1 tablet (15 mg total) by mouth daily. 30 tablet 0  . calcium-vitamin D (OSCAL WITH D) 500-200 MG-UNIT tablet Take 1 tablet by mouth daily.    . clonazePAM (KLONOPIN) 0.5 MG tablet TAKE 1/2 TABLET BY MOUTH TWICE A DAY AS NEEDED FOR ANXIETY    . denosumab (PROLIA) 60 MG/ML SOLN injection Inject 60 mg into the skin every 6 (six) months. Administer in upper arm, thigh, or abdomen    . desvenlafaxine (PRISTIQ) 100 MG 24 hr tablet Take 100 mg by mouth at bedtime 30 tablet 0  . diclofenac sodium (VOLTAREN) 1 % GEL APPLY 4G TOPICALLY 4 TIMES DAILY 100 g 1  . gabapentin (NEURONTIN) 300 MG capsule Patient take 1 in the am and 2 at bedtime 270 capsule 3  . levothyroxine (SYNTHROID) 137 MCG tablet TAKE 1/2 TABLET DAILY 5 DAYS A WEEK AND 1 TABLET DAILY 2 DAYS A WEEK. 180 tablet 3  . lidocaine (LIDODERM) 5 % Place 1 patch onto the skin daily. Remove & Discard patch within 12 hours or as directed by MD 30 patch 12   . Menthol, Topical Analgesic, (MINERAL ICE EX) Apply 1 application to affected sites one to two times a day as needed (for pain)    . metFORMIN (GLUCOPHAGE-XR) 500 MG 24 hr tablet Take 1 tablet (500 mg total) by mouth daily with breakfast. 90 tablet 1  .  metoprolol succinate (TOPROL-XL) 25 MG 24 hr tablet TAKE 1 TABLET (25 MG TOTAL) BY MOUTH DAILY. PLEASE KEEP UPCOMING APPT FOR REFILLS 90 tablet 3  . MYRBETRIQ 50 MG TB24 tablet TAKE 1 TABLET BY MOUTH EVERY DAY 90 tablet 3  . nitroGLYCERIN (NITROSTAT) 0.4 MG SL tablet PLACE 1 TABLET (0.4 MG TOTAL) UNDER THE TONGUE EVERY 5 (FIVE) MINUTES AS NEEDED. (Patient not taking: Reported on 12/30/2019) 25 tablet 2  . Omega-3 Fatty Acids (FISH OIL PO) Take 1 capsule by mouth daily.     . pantoprazole (PROTONIX) 40 MG tablet TAKE 1 TABLET BY MOUTH EVERY DAY 90 tablet 3  . REXULTI 1 MG TABS Take 1 tablet by mouth daily.     Marland Kitchen topiramate (TOPAMAX) 50 MG tablet TAKE 1 TABLET BY MOUTH TWICE A DAY 180 tablet 2  . triamcinolone ointment (KENALOG) 0.1 % Apply 1 application topically 2 (two) times daily. To affected area(s) as needed, sparing use to avoid whitening/thinning skin 30 g 1   No facility-administered medications prior to visit.    Allergies  Allergen Reactions  . Hydromorphone Rash  . Levofloxacin Other (See Comments)    Hallucinations  . Meperidine Nausea And Vomiting and Other (See Comments)    Also "doesn't work well for my pain"  . Broccoli [Brassica Oleracea] Nausea Only and Other (See Comments)    Causes stomach pain also  . Adhesive [Tape] Rash    Review of Systems All ROS negative except for what is listed in HPI    Objective:    Physical Exam Vitals and nursing note reviewed.  Constitutional:      Appearance: Normal appearance.  HENT:     Head: Normocephalic.     Nose: Nose normal.     Mouth/Throat:     Mouth: Mucous membranes are moist.     Pharynx: Oropharynx is clear.  Eyes:     General: Lids are normal. Lids are everted,  no foreign bodies appreciated. Vision grossly intact. Gaze aligned appropriately. No allergic shiner or visual field deficit.       Right eye: Discharge present.        Left eye: Discharge present.    Extraocular Movements: Extraocular movements intact.     Conjunctiva/sclera:     Right eye: Right conjunctiva is injected. Exudate present.     Left eye: Left conjunctiva is injected. Exudate present.     Pupils: Pupils are equal, round, and reactive to light.  Cardiovascular:     Rate and Rhythm: Normal rate and regular rhythm.     Pulses: Normal pulses.     Heart sounds: Normal heart sounds.  Pulmonary:     Effort: Pulmonary effort is normal.     Breath sounds: Normal breath sounds.  Musculoskeletal:     Cervical back: Normal range of motion.  Lymphadenopathy:     Cervical: No cervical adenopathy.  Skin:    General: Skin is warm and dry.     Capillary Refill: Capillary refill takes less than 2 seconds.  Neurological:     General: No focal deficit present.     Mental Status: She is alert and oriented to person, place, and time.  Psychiatric:        Mood and Affect: Mood normal.        Behavior: Behavior normal.        Thought Content: Thought content normal.        Judgment: Judgment normal.     BP 132/73   Pulse  64   Temp (!) 97.5 F (36.4 C) (Oral)   Ht 4\' 10"  (1.473 m)   Wt 127 lb 3.2 oz (57.7 kg)   SpO2 97%   BMI 26.58 kg/m  Wt Readings from Last 3 Encounters:  01/23/20 127 lb 3.2 oz (57.7 kg)  12/30/19 126 lb (57.2 kg)  12/24/19 128 lb (58.1 kg)    There are no preventive care reminders to display for this patient.  There are no preventive care reminders to display for this patient.   Lab Results  Component Value Date   TSH 0.71 02/03/2019   Lab Results  Component Value Date   WBC 5.4 09/11/2017   HGB 13.5 09/11/2017   HCT 40.7 09/11/2017   MCV 88.9 09/11/2017   PLT 198 09/11/2017   Lab Results  Component Value Date   NA 142 11/14/2019   K 4.1  11/14/2019   CO2 24 11/14/2019   GLUCOSE 100 (H) 11/14/2019   BUN 14 11/14/2019   CREATININE 0.91 11/14/2019   BILITOT 0.2 11/14/2019   ALKPHOS 73 12/09/2017   AST 16 11/14/2019   ALT 17 11/14/2019   PROT 6.5 11/14/2019   ALBUMIN 4.4 12/09/2017   CALCIUM 9.7 11/14/2019   ANIONGAP 9 07/08/2017   Lab Results  Component Value Date   CHOL 140 02/03/2019   Lab Results  Component Value Date   HDL 85 02/03/2019   Lab Results  Component Value Date   LDLCALC 42 02/03/2019   Lab Results  Component Value Date   TRIG 57 02/03/2019   Lab Results  Component Value Date   CHOLHDL 1.6 02/03/2019   Lab Results  Component Value Date   HGBA1C 6.1 (A) 12/30/2019       Assessment & Plan:   Problem List Items Addressed This Visit      Other   Acute conjunctivitis of both eyes - Primary    Acute conjunctivitis bilaterally.   Is unclear at this time if it is a bacterial or possibly viral origin however the patient is getting ready to leave to go out of the state for an extended period of time therefore we will treat empirically with azithromycin drops bilaterally twice a day for 7 days. Adding azelastine drops twice day for 7 days as well to help with symptoms. Patient educated on warm and cool compresses, avoiding touching the eyes, and gently washing the eyes morning and nightly. Please follow-up if symptoms worsen or fail to improve.      Relevant Medications   azithromycin (AZASITE) 1 % ophthalmic solution   azelastine (OPTIVAR) 0.05 % ophthalmic solution       Meds ordered this encounter  Medications  . azithromycin (AZASITE) 1 % ophthalmic solution    Sig: Place 1 drop into both eyes 2 (two) times daily.    Dispense:  2.5 mL    Refill:  0  . azelastine (OPTIVAR) 0.05 % ophthalmic solution    Sig: Place 2 drops into both eyes 2 (two) times daily.    Dispense:  6 mL    Refill:  11     Orma Render, NP

## 2020-01-23 NOTE — Assessment & Plan Note (Addendum)
Acute conjunctivitis bilaterally.   Is unclear at this time if it is a bacterial or possibly viral origin however the patient is getting ready to leave to go out of the state for an extended period of time therefore we will treat empirically with azithromycin drops bilaterally twice a day for 7 days. Adding azelastine drops twice day for 7 days as well to help with symptoms. Patient educated on warm and cool compresses, avoiding touching the eyes, and gently washing the eyes morning and nightly. Please follow-up if symptoms worsen or fail to improve.

## 2020-01-23 NOTE — Patient Instructions (Addendum)
The azithromycin is the antibiotic drops. This is the most important, if you can only get one then this is the one I would like you to get.   The azelastine is a mast stabilizer which helps with some of the redness, drainage, and gritty feeling in the eyes.   Use the azelastine first by putting 2 drops in each eye in the morning and at night.  About 5-10 minutes later use the azithromycin by putting 1 drop in each eye in the morning and the night.  Use these for 10 days total.   You can increase your pantoprazole (Protonix) to take one pill in the morning and one pill at night for 7 days to see if this helps with the cough at night.    Bacterial Conjunctivitis, Adult Bacterial conjunctivitis is an infection of your conjunctiva. This is the clear membrane that covers the white part of your eye and the inner part of your eyelid. This infection can make your eye:  Red or pink.  Itchy. This condition spreads easily from person to person (is contagious) and from one eye to the other eye. What are the causes?  This condition is caused by germs (bacteria). You may get the infection if you come into close contact with: ? A person who has the infection. ? Items that have germs on them (are contaminated), such as face towels, contact lens solution, or eye makeup. What increases the risk? You are more likely to get this condition if you:  Have contact with people who have the infection.  Wear contact lenses.  Have a sinus infection.  Have had a recent eye injury or surgery.  Have a weak body defense system (immune system).  Have dry eyes. What are the signs or symptoms?   Thick, yellowish discharge from the eye.  Tearing or watery eyes.  Itchy eyes.  Burning feeling in your eyes.  Eye redness.  Swollen eyelids.  Blurred vision. How is this treated?   Antibiotic eye drops or ointment.  Antibiotic medicine taken by mouth. This is used for infections that do not get better  with drops or ointment or that last more than 10 days.  Cool, wet cloths placed on the eyes.  Artificial tears used 2-6 times a day. Follow these instructions at home: Medicines  Take or apply your antibiotic medicine as told by your doctor. Do not stop taking or applying the antibiotic even if you start to feel better.  Take or apply over-the-counter and prescription medicines only as told by your doctor.  Do not touch your eyelid with the eye-drop bottle or the ointment tube. Managing discomfort  Wipe any fluid from your eye with a warm, wet washcloth or a cotton ball.  Place a clean, cool, wet cloth on your eye. Do this for 10-20 minutes, 3-4 times per day. General instructions  Do not wear contacts until the infection is gone. Wear glasses until your doctor says it is okay to wear contacts again.  Do not wear eye makeup until the infection is gone. Throw away old eye makeup.  Change or wash your pillowcase every day.  Do not share towels or washcloths.  Wash your hands often with soap and water. Use paper towels to dry your hands.  Do not touch or rub your eyes.  Do not drive or use heavy machinery if your vision is blurred. Contact a doctor if:  You have a fever.  You do not get better after 10 days. Get  help right away if:  You have a fever and your symptoms get worse all of a sudden.  You have very bad pain when you move your eye.  Your face: ? Hurts. ? Is red. ? Is swollen.  You have sudden loss of vision. Summary  Bacterial conjunctivitis is an infection of your conjunctiva.  This infection spreads easily from person to person.  Wash your hands often with soap and water. Use paper towels to dry your hands.  Take or apply your antibiotic medicine as told by your doctor.  Contact a doctor if you have a fever or you do not get better after 10 days. This information is not intended to replace advice given to you by your health care provider. Make sure  you discuss any questions you have with your health care provider. Document Revised: 07/06/2018 Document Reviewed: 10/21/2017 Elsevier Patient Education  [employer].

## 2020-02-08 ENCOUNTER — Encounter: Payer: BC Managed Care – PPO | Admitting: Physician Assistant

## 2020-02-10 ENCOUNTER — Ambulatory Visit (INDEPENDENT_AMBULATORY_CARE_PROVIDER_SITE_OTHER): Payer: BC Managed Care – PPO | Admitting: Podiatry

## 2020-02-10 ENCOUNTER — Other Ambulatory Visit: Payer: Self-pay

## 2020-02-10 DIAGNOSIS — B351 Tinea unguium: Secondary | ICD-10-CM | POA: Diagnosis not present

## 2020-02-10 DIAGNOSIS — M79675 Pain in left toe(s): Secondary | ICD-10-CM | POA: Diagnosis not present

## 2020-02-10 DIAGNOSIS — L600 Ingrowing nail: Secondary | ICD-10-CM | POA: Diagnosis not present

## 2020-02-10 DIAGNOSIS — M79674 Pain in right toe(s): Secondary | ICD-10-CM

## 2020-02-10 MED ORDER — CEPHALEXIN 500 MG PO CAPS: 500.0000 mg | ORAL_CAPSULE | Freq: Three times a day (TID) | ORAL | 0 refills | Status: DC

## 2020-02-10 NOTE — Patient Instructions (Addendum)

## 2020-02-10 NOTE — Progress Notes (Signed)
Subjective:   Patient ID: [name] Mahl, female   DOB: 64 y.o.   MRN: [id number]   HPI 64 year old female presents the office today for concerns of chronic ingrown toenails of both corners of both big toes.  She states the previously cut out before.  She has recently trim her self which helps some.  However this becomes an ongoing issue.  She denies any redness or drainage at this time and no drainage or pus.  She has no other concerns today.  Her last A1c was 6.1 on May 06, 2019.   Review of Systems  All other systems reviewed and are negative.  Past Medical History:  Diagnosis Date  . Anxiety   . Arthritis    "right little finger; base of thumb left hand; spine" (07/07/2017)  . Asthma, chronic 11/27/2015  . Chronic back pain   . Coronary artery disease    4/19 PCI/DEx2 to LAD, and PCI/DES x1 to RCA, normal EF  . Depression   . Diverticulitis of colon 12/21/2015   Colonoscopy every 5 years/digestive health.   . GERD (gastroesophageal reflux disease) 11/27/2015  . History of blood transfusion    "related to spinal fusions"  . Hypothyroidism   . Idiopathic scoliosis 01/22/2016  . Osteoporosis 11/27/2015   On prolia.   . Pneumonia 2016  . Pneumothorax 06/26/2017  . Pre-diabetes   . Thyroid disease     Past Surgical History:  Procedure Laterality Date  . ABDOMINAL HYSTERECTOMY    . bladder mesh     S/P bladder sling"  . CORONARY STENT INTERVENTION N/A 07/07/2017   Procedure: CORONARY STENT INTERVENTION;  Surgeon: Leonie Man, MD;  Location: [place] [facility];  Service: Cardiovascular;  Laterality: N/A;  . ELBOW SURGERY Right    "dislocation"  . INCONTINENCE SURGERY     "didn't work"  . INTRAVASCULAR PRESSURE WIRE/FFR STUDY N/A 07/07/2017   Procedure: INTRAVASCULAR PRESSURE WIRE/FFR STUDY;  Surgeon: Leonie Man, MD;  Location: [place] [facility];  Service: Cardiovascular;  Laterality: N/A;  . JOINT REPLACEMENT    . LEFT HEART CATH AND CORONARY ANGIOGRAPHY N/A  07/07/2017   Procedure: LEFT HEART CATH AND CORONARY ANGIOGRAPHY;  Surgeon: Leonie Man, MD;  Location: [place] [facility];  Service: Cardiovascular;  Laterality: N/A;  . SPINAL FUSION  ~ 1970 X 3   "below neck - lower back"  . TONSILLECTOMY    . TOTAL HIP ARTHROPLASTY Left      Current Outpatient Medications:  .  albuterol (PROVENTIL HFA;VENTOLIN HFA) 108 (90 Base) MCG/ACT inhaler, Inhale 1-2 puffs into the lungs every 4 (four) hours as needed for wheezing or shortness of breath., Disp: 1 Inhaler, Rfl: 3 .  aspirin EC 81 MG tablet, Take 81 mg by mouth at bedtime., Disp: , Rfl:  .  atorvastatin (LIPITOR) 20 MG tablet, Take 1 tablet (20 mg total) by mouth daily., Disp: 90 tablet, Rfl: 3 .  azelastine (OPTIVAR) 0.05 % ophthalmic solution, Place 2 drops into both eyes 2 (two) times daily., Disp: 6 mL, Rfl: 11 .  azithromycin (AZASITE) 1 % ophthalmic solution, Place 1 drop into both eyes 2 (two) times daily., Disp: 2.5 mL, Rfl: 0 .  busPIRone (BUSPAR) 15 MG tablet, Take 1 tablet (15 mg total) by mouth daily., Disp: 30 tablet, Rfl: 0 .  calcium-vitamin D (OSCAL WITH D) 500-200 MG-UNIT tablet, Take 1 tablet by mouth daily., Disp: , Rfl:  .  cephALEXin (KEFLEX) 500 MG capsule, Take 1 capsule (  500 mg total) by mouth 3 (three) times daily., Disp: 21 capsule, Rfl: 0 .  clonazePAM (KLONOPIN) 0.5 MG tablet, TAKE 1/2 TABLET BY MOUTH TWICE A DAY AS NEEDED FOR ANXIETY, Disp: , Rfl:  .  denosumab (PROLIA) 60 MG/ML SOLN injection, Inject 60 mg into the skin every 6 (six) months. Administer in upper arm, thigh, or abdomen, Disp: , Rfl:  .  desvenlafaxine (PRISTIQ) 100 MG 24 hr tablet, Take 100 mg by mouth at bedtime, Disp: 30 tablet, Rfl: 0 .  diclofenac sodium (VOLTAREN) 1 % GEL, APPLY 4G TOPICALLY 4 TIMES DAILY, Disp: 100 g, Rfl: 1 .  gabapentin (NEURONTIN) 300 MG capsule, Patient take 1 in the am and 2 at bedtime, Disp: 270 capsule, Rfl: 3 .  levothyroxine (SYNTHROID) 137 MCG tablet, TAKE 1/2 TABLET DAILY  5 DAYS A WEEK AND 1 TABLET DAILY 2 DAYS A WEEK., Disp: 180 tablet, Rfl: 3 .  lidocaine (LIDODERM) 5 %, Place 1 patch onto the skin daily. Remove & Discard patch within 12 hours or as directed by MD, Disp: 30 patch, Rfl: 12 .  Menthol, Topical Analgesic, (MINERAL ICE EX), Apply 1 application to affected sites one to two times a day as needed (for pain), Disp: , Rfl:  .  metFORMIN (GLUCOPHAGE-XR) 500 MG 24 hr tablet, Take 1 tablet (500 mg total) by mouth daily with breakfast., Disp: 90 tablet, Rfl: 1 .  metoprolol succinate (TOPROL-XL) 25 MG 24 hr tablet, TAKE 1 TABLET (25 MG TOTAL) BY MOUTH DAILY. PLEASE KEEP UPCOMING APPT FOR REFILLS, Disp: 90 tablet, Rfl: 3 .  MYRBETRIQ 50 MG TB24 tablet, TAKE 1 TABLET BY MOUTH EVERY DAY, Disp: 90 tablet, Rfl: 3 .  nitroGLYCERIN (NITROSTAT) 0.4 MG SL tablet, PLACE 1 TABLET (0.4 MG TOTAL) UNDER THE TONGUE EVERY 5 (FIVE) MINUTES AS NEEDED. (Patient not taking: Reported on 12/30/2019), Disp: 25 tablet, Rfl: 2 .  Omega-3 Fatty Acids (FISH OIL PO), Take 1 capsule by mouth daily. , Disp: , Rfl:  .  pantoprazole (PROTONIX) 40 MG tablet, TAKE 1 TABLET BY MOUTH EVERY DAY, Disp: 90 tablet, Rfl: 3 .  REXULTI 1 MG TABS, Take 1 tablet by mouth daily. , Disp: , Rfl:  .  topiramate (TOPAMAX) 50 MG tablet, TAKE 1 TABLET BY MOUTH TWICE A DAY, Disp: 180 tablet, Rfl: 2 .  triamcinolone ointment (KENALOG) 0.1 %, Apply 1 application topically 2 (two) times daily. To affected area(s) as needed, sparing use to avoid whitening/thinning skin, Disp: 30 g, Rfl: 1  Allergies  Allergen Reactions  . Hydromorphone Rash  . Levofloxacin Other (See Comments)    Hallucinations  . Meperidine Nausea And Vomiting and Other (See Comments)    Also "doesn't work well for my pain"  . Broccoli [Brassica Oleracea] Nausea Only and Other (See Comments)    Causes stomach pain also  . Adhesive [Tape] Rash        Objective:  Physical Exam  General: AAO x3, NAD  Dermatological: Bilateral hallux nails  are mildly hypertrophic, dystrophic with yellow discoloration and there is incurvation present to both the medial and lateral aspects of bilateral hallux toenails with tenderness palpation of the nail borders.  There is no edema, erythema or signs of infection.  No open lesions.  Vascular: Dorsalis Pedis artery and Posterior Tibial artery pedal pulses are 2/4 bilateral with immedate capillary fill time.  There is no pain with calf compression, swelling, warmth, erythema.   Neruologic: Grossly intact via light touch bilateral.   Musculoskeletal: Tenderness  of the ingrown toenail borders without any other areas of discomfort bilaterally. Muscular strength 5/5 in all groups tested bilateral.  Gait: Unassisted, Nonantalgic.       Assessment:   Bilateral hallux ingrown toenails     Plan:  -Treatment options discussed including all alternatives, risks, and complications -Etiology of symptoms were discussed -We discussed both conservative as well as surgical options.  She brought up removing of the thick toenails completely.  After discussion we decided proceed with partial nail avulsion with chemical matricectomy.  Discussed likely fungus is also contribute this.  After the procedure is healed we can use of urea cream or fungus medication to help with the toenails. -At this time, the patient is requesting partial nail removal with chemical matricectomy to the symptomatic portion of the nail. Risks and complications were discussed with the patient for which they understand and written consent was obtained. Under sterile conditions a total of 3 mL of a mixture of 2% lidocaine plain and 0.5% Marcaine plain was infiltrated in a hallux block fashion. Once anesthetized, the skin was prepped in sterile fashion. A tourniquet was then applied. Next the medial/lateral aspect of hallux nail border was then sharply excised making sure to remove the entire offending nail border. Once the nails were ensured to be  removed area was debrided and the underlying skin was intact. There is no purulence identified in the procedure. Next phenol was then applied under standard conditions and copiously irrigated. Silvadene was applied. A dry sterile dressing was applied. After application of the dressing the tourniquet was removed and there is found to be an immediate capillary refill time to the digit. The patient tolerated the procedure well any complications. Post procedure instructions were discussed the patient for which he verbally understood. Follow-up in one week for nail check or sooner if any problems are to arise. Discussed signs/symptoms of infection and directed to call the office immediately should any occur or go directly to the emergency room. In the meantime, encouraged to call the office with any questions, concerns, changes symptoms. -Keflex  No follow-ups on file.  Trula Slade DPM

## 2020-02-15 ENCOUNTER — Other Ambulatory Visit: Payer: Self-pay

## 2020-02-15 ENCOUNTER — Ambulatory Visit (INDEPENDENT_AMBULATORY_CARE_PROVIDER_SITE_OTHER): Payer: BC Managed Care – PPO | Admitting: Physician Assistant

## 2020-02-15 VITALS — BP 102/56 | HR 79 | Ht <= 58 in | Wt 126.0 lb

## 2020-02-15 DIAGNOSIS — E039 Hypothyroidism, unspecified: Secondary | ICD-10-CM

## 2020-02-15 DIAGNOSIS — Z0000 Encounter for general adult medical examination without abnormal findings: Secondary | ICD-10-CM

## 2020-02-15 DIAGNOSIS — Z131 Encounter for screening for diabetes mellitus: Secondary | ICD-10-CM

## 2020-02-15 DIAGNOSIS — E785 Hyperlipidemia, unspecified: Secondary | ICD-10-CM | POA: Diagnosis not present

## 2020-02-15 DIAGNOSIS — M810 Age-related osteoporosis without current pathological fracture: Secondary | ICD-10-CM

## 2020-02-15 MED ORDER — LEVOTHYROXINE SODIUM 137 MCG PO TABS: ORAL_TABLET | ORAL | 3 refills | Status: DC

## 2020-02-15 MED ORDER — ATORVASTATIN CALCIUM 20 MG PO TABS: 20.0000 mg | ORAL_TABLET | Freq: Every day | ORAL | 3 refills | Status: DC

## 2020-02-15 NOTE — Progress Notes (Signed)
Subjective:     Leslie Roberson is a 64 y.o. female and is here for a comprehensive physical exam. The patient reports no problems.    Social History   Socioeconomic History  . Marital status: Married    Spouse name: Not on file  . Number of children: 1  . Years of education: Not on file  . Highest education level: Not on file  Occupational History  . Not on file  Tobacco Use  . Smoking status: Never Smoker  . Smokeless tobacco: Never Used  Vaping Use  . Vaping Use: Never used  Substance and Sexual Activity  . Alcohol use: Yes    Comment: 07/07/2017 "5-6 times/year; 1 drink at each occasion"  . Drug use: No  . Sexual activity: Yes  Other Topics Concern  . Not on file  Social History Narrative  . Not on file   Social Determinants of Health   Financial Resource Strain:   . Difficulty of Paying Living Expenses: Not on file  Food Insecurity:   . Worried About Charity fundraiser in the Last Year: Not on file  . Ran Out of Food in the Last Year: Not on file  Transportation Needs:   . Lack of Transportation (Medical): Not on file  . Lack of Transportation (Non-Medical): Not on file  Physical Activity:   . Days of Exercise per Week: Not on file  . Minutes of Exercise per Session: Not on file  Stress:   . Feeling of Stress : Not on file  Social Connections:   . Frequency of Communication with Friends and Family: Not on file  . Frequency of Social Gatherings with Friends and Family: Not on file  . Attends Religious Services: Not on file  . Active Member of Clubs or Organizations: Not on file  . Attends Archivist Meetings: Not on file  . Marital Status: Not on file  Intimate Partner Violence:   . Fear of Current or Ex-Partner: Not on file  . Emotionally Abused: Not on file  . Physically Abused: Not on file  . Sexually Abused: Not on file   Health Maintenance  Topic Date Due  . OPHTHALMOLOGY EXAM  02/29/2020  . DEXA SCAN  03/10/2020  . HEMOGLOBIN A1C   06/29/2020  . FOOT EXAM  07/17/2020  . URINE MICROALBUMIN  07/20/2020  . COLONOSCOPY  12/19/2020  . PAP SMEAR-Modifier  01/29/2021  . MAMMOGRAM  02/15/2021  . TETANUS/TDAP  01/29/2026  . INFLUENZA VACCINE  Completed  . COVID-19 Vaccine  Completed  . Hepatitis C Screening  Completed  . HIV Screening  Completed    The following portions of the patient's history were reviewed and updated as appropriate: allergies, current medications, past family history, past medical history, past social history, past surgical history and problem list.  Review of Systems A comprehensive review of systems was negative.   Objective:    BP (!) 102/56   Pulse 79   Ht 4\' 10"  (1.473 m)   Wt 126 lb (57.2 kg)   SpO2 98%   BMI 26.33 kg/m  General appearance: alert, cooperative and appears stated age Head: Normocephalic, without obvious abnormality, atraumatic Eyes: conjunctivae/corneas clear. PERRL, EOM's intact. Fundi benign. Ears: normal TM's and external ear canals both ears Nose: Nares normal. Septum midline. Mucosa normal. No drainage or sinus tenderness. Throat: lips, mucosa, and tongue normal; teeth and gums normal Neck: no adenopathy, no carotid bruit, no JVD, supple, symmetrical, trachea midline and thyroid not  enlarged, symmetric, no tenderness/mass/nodules Back: significant scolosis Lungs: clear to auscultation bilaterally Heart: regular rate and rhythm, S1, S2 normal, no murmur, click, rub or gallop Abdomen: soft, non-tender; bowel sounds normal; no masses,  no organomegaly Extremities: extremities normal, atraumatic, no cyanosis or edema Pulses: 2+ and symmetric Skin: Skin color, texture, turgor normal. No rashes or lesions Lymph nodes: Cervical, supraclavicular, and axillary nodes normal. Neurologic: Alert and oriented X 3, normal strength and tone. Normal symmetric reflexes. Normal coordination and gait   .Marland Kitchen Depression screen [facility] 2/9 02/15/2020 07/18/2019 02/07/2019 02/10/2018 05/22/2017   Decreased Interest 1 0 1 1 1   Down, Depressed, Hopeless 1 0 0 1 2  PHQ - 2 Score 2 0 1 2 3   Altered sleeping - 2 1 2 3   Tired, decreased energy - 1 1 1 2   Change in appetite - 1 0 0 3  Feeling bad or failure about yourself  - 0 0 1 1  Trouble concentrating - 0 0 0 0  Moving slowly or fidgety/restless - 0 0 0 0  Suicidal thoughts - 0 0 0 0  PHQ-9 Score - 4 3 6 12   Difficult doing work/chores - - Not difficult at all Somewhat difficult Somewhat difficult  Some encounter information is confidential and restricted. Go to Review Flowsheets activity to see all data.  Some recent data might be hidden   .Marland Kitchen GAD 7 : Generalized Anxiety Score 07/18/2019 02/07/2019 02/10/2018 05/22/2017  Nervous, Anxious, on Edge 0 0 1 2  Control/stop worrying 0 0 0 1  Worry too much - different things 0 0 0 1  Trouble relaxing 0 0 1 2  Restless 0 0 0 0  Easily annoyed or irritable 0 0 0 1  Afraid - awful might happen 0 0 0 1  Total GAD 7 Score 0 0 2 8  Anxiety Difficulty Not difficult at all Not difficult at all Somewhat difficult Somewhat difficult  Some encounter information is confidential and restricted. Go to Review Flowsheets activity to see all data.     Assessment:    Healthy female exam.      Plan:    Marland KitchenMarland KitchenArcelia was seen today for annual exam.  Diagnoses and all orders for this visit:  Routine physical examination -     Lipid Panel w/reflex Direct LDL -     COMPLETE METABOLIC PANEL WITH GFR -     TSH -     DG Bone Density  Age-related osteoporosis without current pathological fracture -     COMPLETE METABOLIC PANEL WITH GFR -     DG Bone Density  Hyperlipidemia LDL goal <70 -     Lipid Panel w/reflex Direct LDL -     atorvastatin (LIPITOR) 20 MG tablet; Take 1 tablet (20 mg total) by mouth daily.  Acquired hypothyroidism -     TSH -     levothyroxine (SYNTHROID) 137 MCG tablet; TAKE 1/2 TABLET DAILY 5 DAYS A WEEK AND 1 TABLET DAILY 2 DAYS A WEEK.  Screening for diabetes mellitus -      COMPLETE METABOLIC PANEL WITH GFR   .Marland Kitchen Discussed 150 minutes of exercise a week.  Encouraged vitamin D 1000 units and Calcium 1300mg  or 4 servings of dairy a day.  Get labs when due for next prolia.  Bone density ordered with upcoming mammogram.  No need for pap.  Colonoscopy UTD.  Covid/flu/shingles/pneumonia vaccine UTD.  [place] for covid booster.   Recheck TSH and adjust medication accordingly.  See After  Visit Summary for Counseling Recommendations

## 2020-02-15 NOTE — Patient Instructions (Signed)

## 2020-02-17 ENCOUNTER — Encounter: Payer: Self-pay | Admitting: Physician Assistant

## 2020-02-27 ENCOUNTER — Ambulatory Visit: Payer: BC Managed Care – PPO | Admitting: Podiatry

## 2020-02-28 DIAGNOSIS — F333 Major depressive disorder, recurrent, severe with psychotic symptoms: Secondary | ICD-10-CM | POA: Diagnosis not present

## 2020-02-29 ENCOUNTER — Ambulatory Visit: Payer: BC Managed Care – PPO

## 2020-02-29 ENCOUNTER — Other Ambulatory Visit: Payer: Self-pay | Admitting: Physician Assistant

## 2020-02-29 DIAGNOSIS — E039 Hypothyroidism, unspecified: Secondary | ICD-10-CM

## 2020-03-01 ENCOUNTER — Ambulatory Visit: Payer: BC Managed Care – PPO

## 2020-03-01 DIAGNOSIS — M5136 Other intervertebral disc degeneration, lumbar region: Secondary | ICD-10-CM | POA: Diagnosis not present

## 2020-03-01 DIAGNOSIS — M47816 Spondylosis without myelopathy or radiculopathy, lumbar region: Secondary | ICD-10-CM | POA: Diagnosis not present

## 2020-03-01 DIAGNOSIS — M7061 Trochanteric bursitis, right hip: Secondary | ICD-10-CM | POA: Diagnosis not present

## 2020-03-02 ENCOUNTER — Other Ambulatory Visit: Payer: Self-pay

## 2020-03-02 ENCOUNTER — Ambulatory Visit (INDEPENDENT_AMBULATORY_CARE_PROVIDER_SITE_OTHER): Payer: BC Managed Care – PPO | Admitting: Podiatry

## 2020-03-02 DIAGNOSIS — M79675 Pain in left toe(s): Secondary | ICD-10-CM | POA: Diagnosis not present

## 2020-03-02 DIAGNOSIS — L600 Ingrowing nail: Secondary | ICD-10-CM | POA: Diagnosis not present

## 2020-03-02 DIAGNOSIS — M79674 Pain in right toe(s): Secondary | ICD-10-CM | POA: Diagnosis not present

## 2020-03-02 NOTE — Progress Notes (Signed)
Subjective: Leslie Roberson is a 64 y.o.  female returns to office today for follow up evaluation after having bilateral Hallux medial and lateral partial nail avulsion performed. Patient has been soaking using epsom salts and applying topical antibiotic covered with bandaid daily. Patient denies fevers, chills, nausea, vomiting. Denies any calf pain, chest pain, SOB.   Objective:  General: Well developed, nourished, in no acute distress, alert and oriented x3   Dermatology: Skin is warm, dry and supple bilateral. Left and right hallux nail border appears to be clean, dry,with minimal scab present. No granulation tissue present. There is no surrounding erythema, edema, drainage/purulence. The remaining nails appear unremarkable at this time. There are no other lesions or other signs of infection present.  Neurovascular status: Intact. No lower extremity swelling; No pain with calf compression bilateral.  Musculoskeletal: No tenderness to palpation of the bilateral hallux nail folds. Muscular strength within normal limits bilateral.   Assesement and Plan: S/p partial nail avulsion, doing well.   -Continue soaking in epsom salts twice a day followed by antibiotic ointment and a band-aid. Can leave uncovered at night. Continue this until completely healed.  -If the area has not healed in 2 weeks, call the office for follow-up appointment, or sooner if any problems arise.  -Monitor for any signs/symptoms of infection. Call the office immediately if any occur or go directly to the emergency room. Call with any questions/concerns.  Celesta Gentile, DPM

## 2020-03-15 ENCOUNTER — Other Ambulatory Visit: Payer: Self-pay

## 2020-03-15 ENCOUNTER — Ambulatory Visit: Payer: BC Managed Care – PPO

## 2020-03-21 DIAGNOSIS — E119 Type 2 diabetes mellitus without complications: Secondary | ICD-10-CM | POA: Diagnosis not present

## 2020-03-21 DIAGNOSIS — H2513 Age-related nuclear cataract, bilateral: Secondary | ICD-10-CM | POA: Diagnosis not present

## 2020-03-21 DIAGNOSIS — H04123 Dry eye syndrome of bilateral lacrimal glands: Secondary | ICD-10-CM | POA: Diagnosis not present

## 2020-03-21 DIAGNOSIS — Z79899 Other long term (current) drug therapy: Secondary | ICD-10-CM | POA: Diagnosis not present

## 2020-03-21 LAB — HM DIABETES EYE EXAM

## 2020-04-04 ENCOUNTER — Ambulatory Visit (INDEPENDENT_AMBULATORY_CARE_PROVIDER_SITE_OTHER): Payer: BC Managed Care – PPO

## 2020-04-04 ENCOUNTER — Other Ambulatory Visit: Payer: Self-pay

## 2020-04-04 DIAGNOSIS — R29890 Loss of height: Secondary | ICD-10-CM | POA: Diagnosis not present

## 2020-04-04 DIAGNOSIS — E039 Hypothyroidism, unspecified: Secondary | ICD-10-CM | POA: Diagnosis not present

## 2020-04-04 DIAGNOSIS — Z1231 Encounter for screening mammogram for malignant neoplasm of breast: Secondary | ICD-10-CM

## 2020-04-04 DIAGNOSIS — Z13820 Encounter for screening for osteoporosis: Secondary | ICD-10-CM

## 2020-04-04 DIAGNOSIS — M810 Age-related osteoporosis without current pathological fracture: Secondary | ICD-10-CM | POA: Diagnosis not present

## 2020-04-04 DIAGNOSIS — Z780 Asymptomatic menopausal state: Secondary | ICD-10-CM | POA: Diagnosis not present

## 2020-04-04 DIAGNOSIS — Z8739 Personal history of other diseases of the musculoskeletal system and connective tissue: Secondary | ICD-10-CM | POA: Diagnosis not present

## 2020-04-04 IMAGING — MG DIGITAL SCREENING BILAT W/ TOMO W/ CAD
6 of 10 series · 6 of 30 positions shown · non-contrast
Comparison: Previous exam(s).

CLINICAL DATA: Screening.

EXAM:
DIGITAL SCREENING BILATERAL MAMMOGRAM WITH TOMO AND CAD

[L MLO synth-2D]
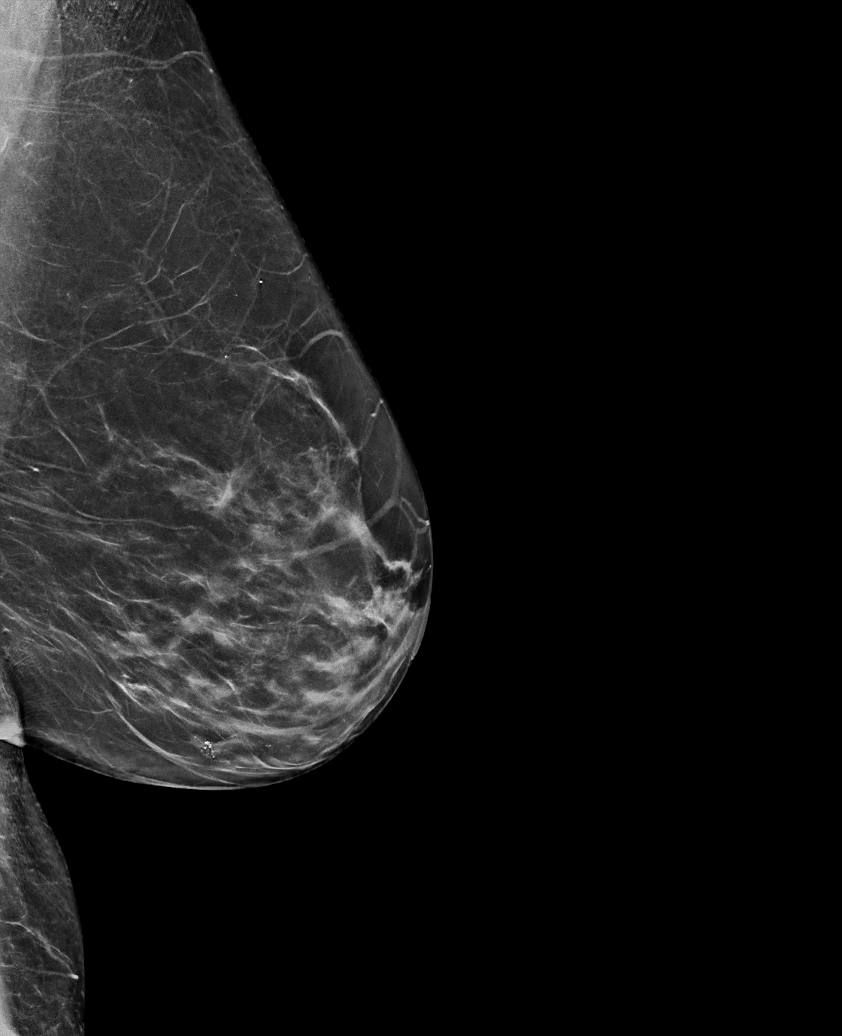

[L CC synth-2D]
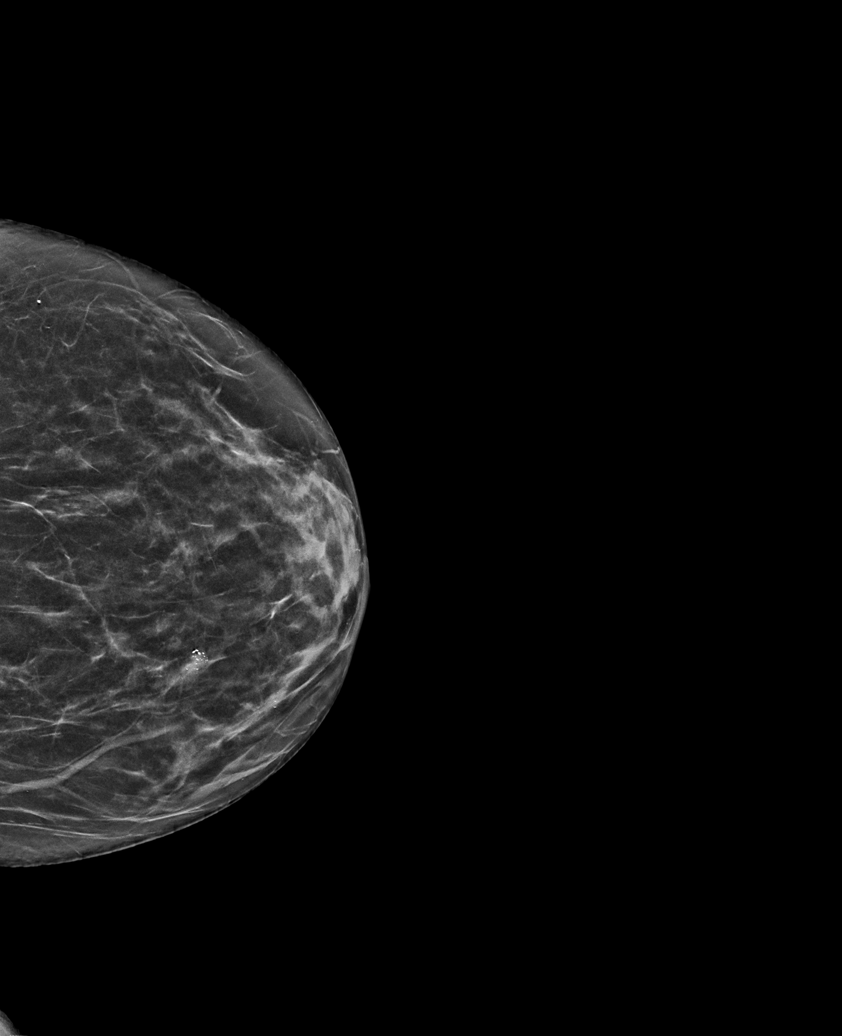

[R CC synth-2D]
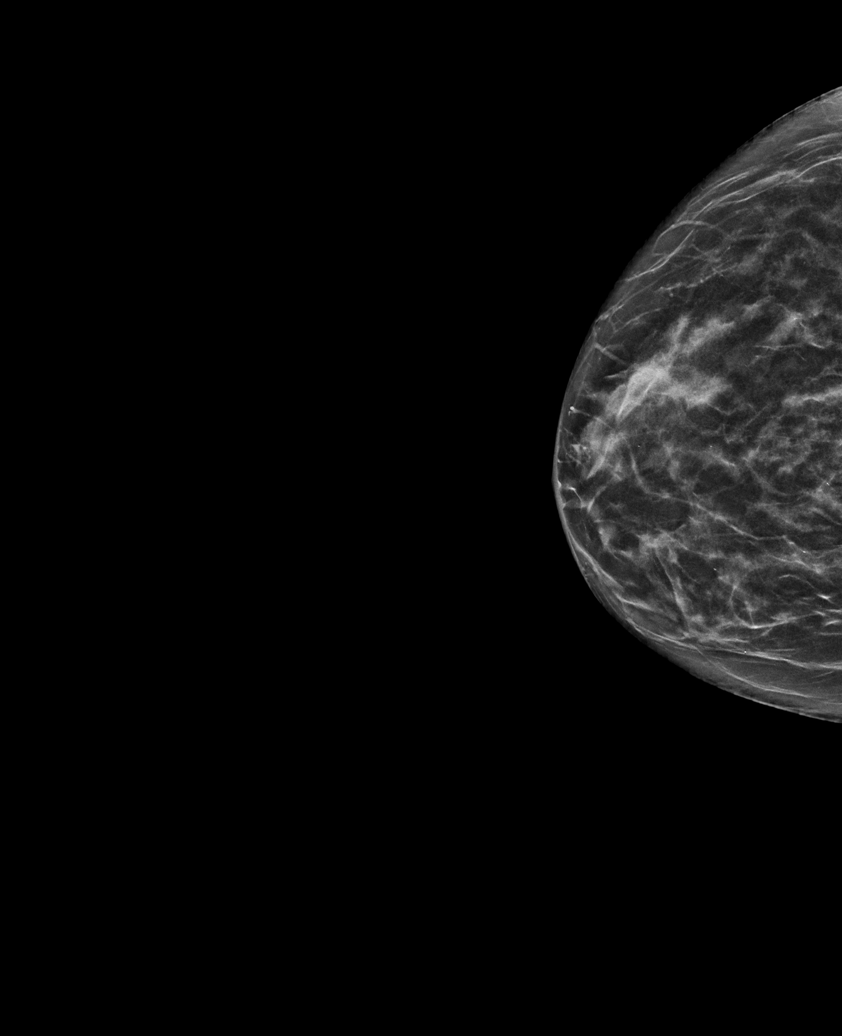

[R MLO synth-2D (1 of 2)]
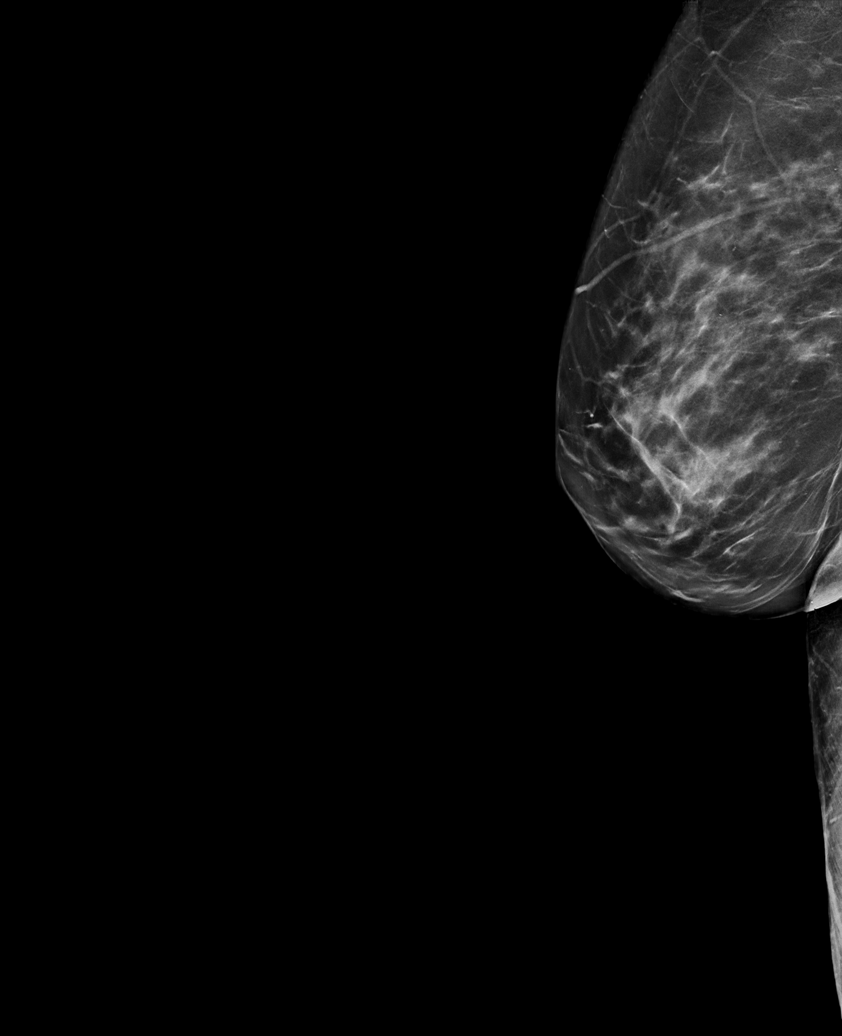

[R MLO synth-2D (2 of 2)]
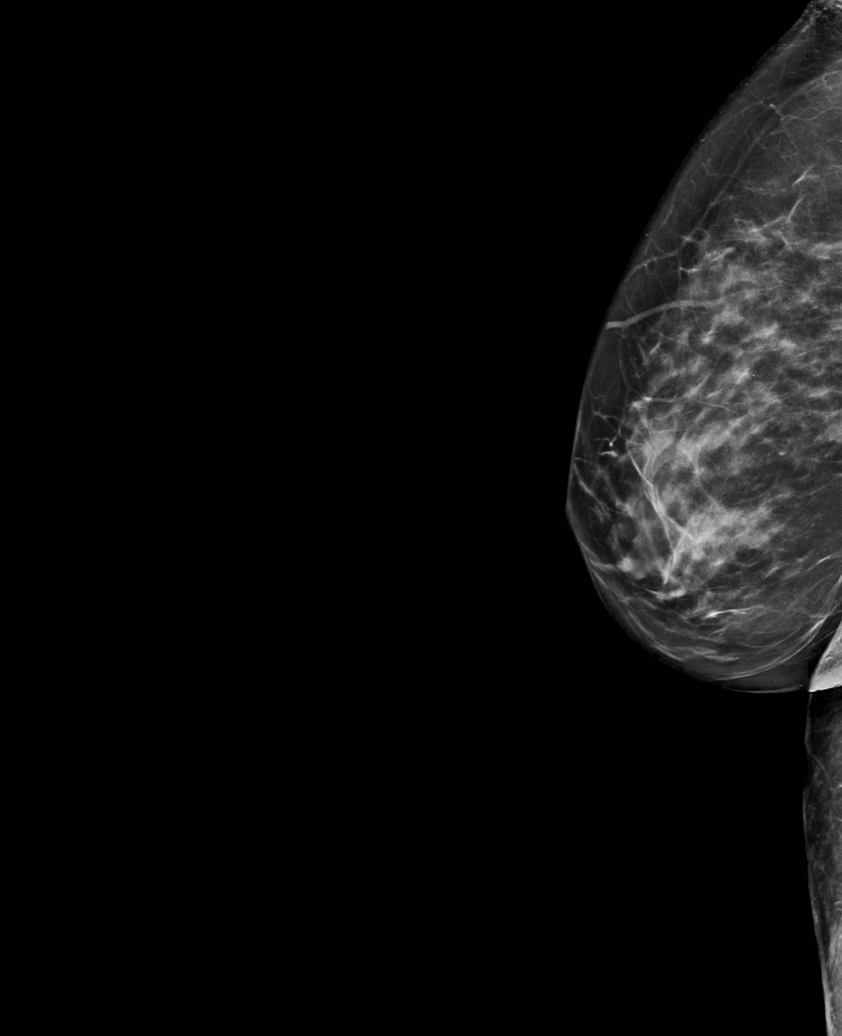

[R MLO tomo · tomo slice 34/67.0]
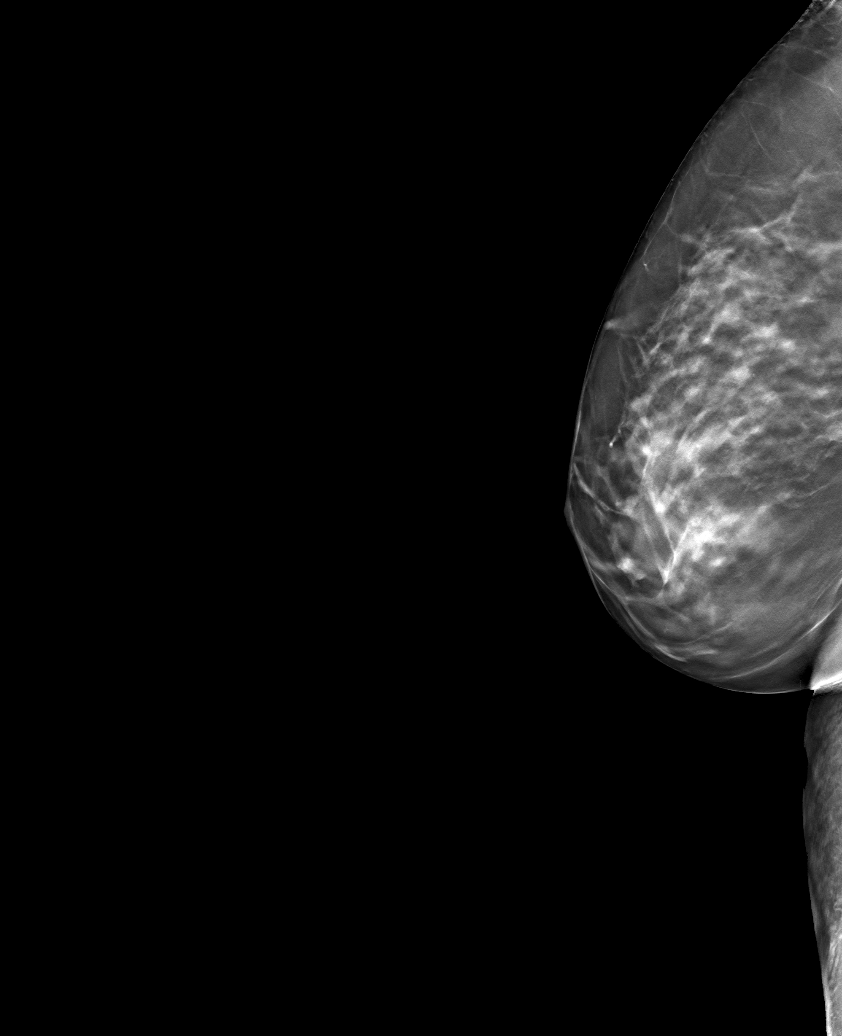

[6 of 30 positions shown; findings below may reference images not displayed]

ACR Breast Density Category c: The breast tissue is heterogeneously
dense, which may obscure small masses.
FINDINGS: There are no findings suspicious for malignancy. Images were
processed with CAD.
IMPRESSION: No mammographic evidence of malignancy. A result letter of this
screening mammogram will be mailed directly to the patient.

RECOMMENDATION:
Screening mammogram in one year. (Code:[id number]-U-LHB)

BI-RADS CATEGORY  1: Negative.

## 2020-04-04 NOTE — Progress Notes (Signed)
Last bone density was -2.5. today -2.6 stable on prolia.

## 2020-04-04 NOTE — Progress Notes (Signed)
Normal mammogram. Follow up in 1 year.

## 2020-04-06 DIAGNOSIS — M7061 Trochanteric bursitis, right hip: Secondary | ICD-10-CM | POA: Diagnosis not present

## 2020-04-06 DIAGNOSIS — M5136 Other intervertebral disc degeneration, lumbar region: Secondary | ICD-10-CM | POA: Diagnosis not present

## 2020-04-06 DIAGNOSIS — M419 Scoliosis, unspecified: Secondary | ICD-10-CM | POA: Diagnosis not present

## 2020-04-15 ENCOUNTER — Other Ambulatory Visit: Payer: Self-pay | Admitting: Physician Assistant

## 2020-04-15 DIAGNOSIS — E118 Type 2 diabetes mellitus with unspecified complications: Secondary | ICD-10-CM

## 2020-04-15 DIAGNOSIS — R202 Paresthesia of skin: Secondary | ICD-10-CM

## 2020-04-16 ENCOUNTER — Encounter: Payer: Self-pay | Admitting: Physician Assistant

## 2020-04-17 DIAGNOSIS — M5416 Radiculopathy, lumbar region: Secondary | ICD-10-CM | POA: Diagnosis not present

## 2020-04-18 ENCOUNTER — Other Ambulatory Visit: Payer: Self-pay

## 2020-04-18 ENCOUNTER — Ambulatory Visit (INDEPENDENT_AMBULATORY_CARE_PROVIDER_SITE_OTHER): Payer: BC Managed Care – PPO | Admitting: Physical Therapy

## 2020-04-18 DIAGNOSIS — M545 Low back pain, unspecified: Secondary | ICD-10-CM

## 2020-04-18 DIAGNOSIS — M6281 Muscle weakness (generalized): Secondary | ICD-10-CM

## 2020-04-18 DIAGNOSIS — M542 Cervicalgia: Secondary | ICD-10-CM | POA: Diagnosis not present

## 2020-04-18 DIAGNOSIS — G8929 Other chronic pain: Secondary | ICD-10-CM | POA: Diagnosis not present

## 2020-04-18 NOTE — Patient Instructions (Addendum)
Access Code: [email or web address] URL: [email or web address] Date: 04/18/2020 Prepared by: Isabelle Course  Exercises Supine Bridge - 1 x daily - 7 x weekly - 2 sets - 10 reps Supine Pelvic Tilt - 1 x daily - 7 x weekly - 2 sets - 10 reps - 3-5 seconds hold Lower Trunk Rotations - 1 x daily - 7 x weekly - 3 sets - 10 reps Gentle Levator Scapulae Stretch - 1 x daily - 7 x weekly - 3 sets - 1 reps - 20-30 seconds hold Supine Chin Tuck - 1 x daily - 7 x weekly - 1 sets - 10 reps - 3-5 seconds hold

## 2020-04-18 NOTE — Therapy (Signed)
[place], [place], [address] Phone: [phone]   Fax:  [phone]  Physical Therapy Evaluation  Patient Details  Name: Leslie Roberson MRN: [id number] Date of Birth: 06/13/1955 Referring Provider (PT): Zonia Kief   Encounter Date: 04/18/2020   PT End of Session - 04/18/20 1619    Visit Number 1    Number of Visits 12    Date for PT Re-Evaluation 05/30/20    PT Start Time 1430    PT Stop Time 1510    PT Time Calculation (min) 40 min    Activity Tolerance Patient tolerated treatment well    Behavior During Therapy [facility] for tasks assessed/performed           Past Medical History:  Diagnosis Date  . Anxiety   . Arthritis    "right little finger; base of thumb left hand; spine" (07/07/2017)  . Asthma, chronic 11/27/2015  . Chronic back pain   . Coronary artery disease    4/19 PCI/DEx2 to LAD, and PCI/DES x1 to RCA, normal EF  . Depression   . Diverticulitis of colon 12/21/2015   Colonoscopy every 5 years/digestive health.   . GERD (gastroesophageal reflux disease) 11/27/2015  . History of blood transfusion    "related to spinal fusions"  . Hypothyroidism   . Idiopathic scoliosis 01/22/2016  . Osteoporosis 11/27/2015   On prolia.   . Pneumonia 2016  . Pneumothorax 06/26/2017  . Pre-diabetes   . Thyroid disease     Past Surgical History:  Procedure Laterality Date  . ABDOMINAL HYSTERECTOMY    . bladder mesh     S/P bladder sling"  . CORONARY STENT INTERVENTION N/A 07/07/2017   Procedure: CORONARY STENT INTERVENTION;  Surgeon: Leonie Man, MD;  Location: [place] [facility];  Service: Cardiovascular;  Laterality: N/A;  . ELBOW SURGERY Right    "dislocation"  . INCONTINENCE SURGERY     "didn't work"  . INTRAVASCULAR PRESSURE WIRE/FFR STUDY N/A 07/07/2017   Procedure: INTRAVASCULAR PRESSURE WIRE/FFR STUDY;  Surgeon: Leonie Man, MD;  Location: [place] [facility];  Service:  Cardiovascular;  Laterality: N/A;  . JOINT REPLACEMENT    . LEFT HEART CATH AND CORONARY ANGIOGRAPHY N/A 07/07/2017   Procedure: LEFT HEART CATH AND CORONARY ANGIOGRAPHY;  Surgeon: Leonie Man, MD;  Location: [place] [facility];  Service: Cardiovascular;  Laterality: N/A;  . SPINAL FUSION  ~ 1970 X 3   "below neck - lower back"  . TONSILLECTOMY    . TOTAL HIP ARTHROPLASTY Left     There were no vitals filed for this visit.    Subjective Assessment - 04/18/20 1435    Subjective Pt has scoliosis and states "my back has flared up again". Pt states back pain has been worse x 4 months. Pt also states her neck bothers her when riding in the car as a passenger, not driver. Pain is worse with turning neck, standing and twisting back, rolling in bed.    Pertinent History spinal fusion due to scoliosis about 50 years ago    Limitations Standing;Walking    How long can you sit comfortably? 2 hours    How long can you stand comfortably? 5-10 minutes    How long can you walk comfortably? 5 minutes    Diagnostic tests x ray shows    Patient Stated Goals decrease pain in neck and back    Currently in Pain? No/denies  [facility] PT Assessment - 04/18/20 0001      Assessment   Medical Diagnosis cervical and lumbar pain, Rt hip bursitis    Referring Provider (PT) saullo, thomas      Balance Screen   Has the patient fallen in the past 6 months No      Prior Function   Level of Independence Independent      Observation/Other Assessments   Focus on Therapeutic Outcomes (FOTO)  35 functional status measure      Posture/Postural Control   Posture Comments significant scoliosis with repair convex to Lt T5/6      ROM / Strength   AROM / PROM / Strength AROM;Strength      AROM   AROM Assessment Site Cervical;Lumbar    Cervical Flexion WFL    Cervical Extension WFL    Cervical - Right Side Bend limited 75%    Cervical - Left Side Bend limited 75%    Cervical - Right Rotation  limited 25%    Cervical - Left Rotation limited 25%    Lumbar Flexion WFL    Lumbar Extension limited 25%    Lumbar - Right Side Bend unable due to fusion    Lumbar - Left Side Bend limited 75%    Lumbar - Right Rotation limited 50%    Lumbar - Left Rotation limited 25%      Strength   Overall Strength Comments bilat shoulder strength grossly 3+/5    Strength Assessment Site Hip    Right/Left Hip Right;Left    Right Hip Flexion 4-/5    Right Hip Extension 3/5    Right Hip ABduction 3+/5    Left Hip Flexion 4/5    Left Hip Extension 3/5    Left Hip ABduction 3/5      Palpation   Palpation comment TTP Rt lumbar paraspinals, glutes      Special Tests    Special Tests Lumbar;Sacrolliac Tests    Lumbar Tests FABER test      FABER test   findings Positive    Side Right                      Objective measurements completed on examination: See above findings.       [facility] Adult PT Treatment/Exercise - 04/18/20 0001      Exercises   Exercises Lumbar;Neck      Neck Exercises: Seated   Other Seated Exercise levator stretch 2 x 30 sec  bilat      Neck Exercises: Supine   Neck Retraction 10 reps;5 secs      Lumbar Exercises: Supine   Pelvic Tilt 5 reps;5 seconds    Bridge 10 reps    Bridge Limitations in pain free range    Other Supine Lumbar Exercises lower trunk rotation 2 x 10                  PT Education - 04/18/20 1514    Education Details PT POC and goals, HEP    Person(s) Educated Patient    Methods Explanation;Demonstration;Handout    Comprehension Returned demonstration;Verbalized understanding               PT Long Term Goals - 04/18/20 1625      PT LONG TERM GOAL #1   Title Pt will be independent with HEP    Time 6    Period Weeks    Status New    Target Date 05/30/20  PT LONG TERM GOAL #2   Title Pt will improve functional status measure in FOTO to >= 53 to demo improved functional mobility    Time 6    Period  Weeks    Status New    Target Date 05/30/20      PT LONG TERM GOAL #3   Title Pt will drive or ride in car x 2 hours with pain <= 2/10    Time 6    Period Weeks    Status New    Target Date 05/30/20      PT LONG TERM GOAL #4   Title Pt will improve bilat LE strength to 4+/5 to tolerate standing and walking with decreased pain    Time 6    Period Weeks    Status New    Target Date 05/30/20                  Plan - 04/18/20 1623    Clinical Impression Statement Pt presents with increased pain in neck and low back, decreased mobility and strength and will benefit from skilled PT to address deficits and improve functional mobility.    Personal Factors and Comorbidities Comorbidity 1;Time since onset of injury/illness/exacerbation;Past/Current Experience    Examination-Activity Limitations Stand;Sit    Examination-Participation Restrictions Driving    Stability/Clinical Decision Making Stable/Uncomplicated    Clinical Decision Making Low    Rehab Potential Good    PT Frequency 2x / week    PT Duration 6 weeks    PT Treatment/Interventions Cryotherapy;Electrical Stimulation;Neuromuscular re-education;Moist Heat;Iontophoresis 4mg /ml Dexamethasone;Patient/family education;Manual techniques;Taping;Dry needling;Therapeutic exercise;Therapeutic activities;Passive range of motion    PT Next Visit Plan assess HEP, progress core strength    PT Home Exercise Plan Access Code: [email or web address]    Consulted and Agree with Plan of Care Patient           Patient will benefit from skilled therapeutic intervention in order to improve the following deficits and impairments:  Decreased strength,Postural dysfunction,Decreased mobility,Impaired flexibility,Decreased activity tolerance,Pain  Visit Diagnosis: Cervicalgia - Plan: PT plan of care cert/re-cert  Muscle weakness (generalized) - Plan: PT plan of care cert/re-cert  Chronic bilateral low back pain without sciatica - Plan: PT plan of care  cert/re-cert     Problem List Patient Active Problem List   Diagnosis Date Noted  . Acute conjunctivitis of both eyes 01/23/2020  . 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  . Ingrown right greater toenail 07/19/2019  . Status post hip replacement, left 07/19/2019  . Left hip pain 07/19/2019  . OSA on CPAP 05/20/2019  . Snoring 05/02/2019  . Non-restorative sleep 05/02/2019  . Migraine without aura and without status migrainosus, not intractable 02/08/2019  . OAB (overactive bladder) 02/08/2019  . Diabetes mellitus without complication ([place]) 26/71/2458  . Hyperlipidemia LDL goal <70 11/29/2018  . DJD (degenerative joint disease), lumbosacral 11/29/2018  . Pre-operative clearance 11/29/2018  . Nasal dryness 02/06/2018  . Arthralgia 02/05/2018  . Rhinorrhea 01/04/2018  . Excessive sweating 09/22/2017  . Tongue mass 08/14/2017  . Angina, class III ([place]) 07/07/2017  . S/P cardiac cath 07/07/2017  . CAD S/P percutaneous coronary angioplasty   . Pneumothorax 06/25/2017  . Atypical chest pain 05/24/2017  . Itchy scalp 05/24/2017  . Right leg pain 02/06/2017  . Chronic tension-type headache, intractable 02/05/2017  . Prediabetes 02/05/2017  . Lump in throat 01/27/2017  . Dysphagia 01/27/2017  . No energy 09/15/2016  . Bad taste in mouth 07/26/2016  . Paresthesia 04/03/2016  .  Facet hypertrophy of lumbosacral region 04/03/2016  . Chronic back pain 04/03/2016  . Right knee pain 02/19/2016  . Rosacea 02/04/2016  . Vaginal atrophy 01/31/2016  . Idiopathic scoliosis 01/22/2016  . Hip bursitis 12/25/2015  . Biceps tendonitis on right 12/25/2015  . Diverticulitis of colon 12/21/2015  . GERD (gastroesophageal reflux disease) 11/27/2015  . Osteoporosis 11/27/2015  . GAD (generalized anxiety disorder) 11/27/2015  . MDD (major depressive disorder), recurrent, in full remission ([place]) 11/27/2015  . Chronic dryness of both eyes 11/27/2015  . Acquired hypothyroidism 11/27/2015   . Asthma, chronic 11/27/2015  . Insomnia 11/27/2015  . Seborrheic keratoses 11/27/2015  . CAD in native artery 11/27/2015   [facility], PT  Leslie Roberson 04/18/2020, 4:30 PM  [facility] [place], [place], [address] Phone: [phone]   Fax:  [phone]  Name: Leslie Roberson MRN: [id number] Date of Birth: 09-Feb-1956

## 2020-04-25 ENCOUNTER — Other Ambulatory Visit: Payer: Self-pay

## 2020-04-25 ENCOUNTER — Encounter: Payer: Self-pay | Admitting: Physical Therapy

## 2020-04-25 ENCOUNTER — Ambulatory Visit (INDEPENDENT_AMBULATORY_CARE_PROVIDER_SITE_OTHER): Payer: BC Managed Care – PPO | Admitting: Physical Therapy

## 2020-04-25 ENCOUNTER — Encounter: Payer: Self-pay | Admitting: Physician Assistant

## 2020-04-25 DIAGNOSIS — G8929 Other chronic pain: Secondary | ICD-10-CM | POA: Diagnosis not present

## 2020-04-25 DIAGNOSIS — M6281 Muscle weakness (generalized): Secondary | ICD-10-CM

## 2020-04-25 DIAGNOSIS — M542 Cervicalgia: Secondary | ICD-10-CM | POA: Diagnosis not present

## 2020-04-25 DIAGNOSIS — R32 Unspecified urinary incontinence: Secondary | ICD-10-CM

## 2020-04-25 DIAGNOSIS — M545 Low back pain, unspecified: Secondary | ICD-10-CM | POA: Diagnosis not present

## 2020-04-25 NOTE — Patient Instructions (Signed)
Access Code: [email or web address] URL: [email or web address] Date: 04/25/2020 Prepared by: Isabelle Course  Exercises Supine Bridge - 1 x daily - 7 x weekly - 2 sets - 10 reps Supine Pelvic Tilt - 1 x daily - 7 x weekly - 2 sets - 10 reps - 3-5 seconds hold Hooklying Clamshell with Resistance - 1 x daily - 7 x weekly - 3 sets - 10 reps Lower Trunk Rotations - 1 x daily - 7 x weekly - 3 sets - 10 reps Supine Chin Tuck - 1 x daily - 7 x weekly - 1 sets - 10 reps - 3-5 seconds hold Supine Shoulder Horizontal Abduction with Resistance - 1 x daily - 7 x weekly - 3 sets - 10 reps Gentle Levator Scapulae Stretch - 1 x daily - 7 x weekly - 3 sets - 1 reps - 20-30 seconds hold

## 2020-04-25 NOTE — Therapy (Signed)
[place], [place], [address] Phone: [phone]   Fax:  [phone]  Physical Therapy Treatment  Patient Details  Name: Leslie Roberson MRN: [id number] Date of Birth: July 07, 1955 Referring Provider (PT): Zonia Kief   Encounter Date: 04/25/2020   PT End of Session - 04/25/20 1435    Visit Number 2    Number of Visits 12    Date for PT Re-Evaluation 05/30/20    PT Start Time 3875    PT Stop Time 1440    PT Time Calculation (min) 55 min    Activity Tolerance Patient tolerated treatment well    Behavior During Therapy [facility] for tasks assessed/performed           Past Medical History:  Diagnosis Date  . Anxiety   . Arthritis    "right little finger; base of thumb left hand; spine" (07/07/2017)  . Asthma, chronic 11/27/2015  . Chronic back pain   . Coronary artery disease    4/19 PCI/DEx2 to LAD, and PCI/DES x1 to RCA, normal EF  . Depression   . Diverticulitis of colon 12/21/2015   Colonoscopy every 5 years/digestive health.   . GERD (gastroesophageal reflux disease) 11/27/2015  . History of blood transfusion    "related to spinal fusions"  . Hypothyroidism   . Idiopathic scoliosis 01/22/2016  . Osteoporosis 11/27/2015   On prolia.   . Pneumonia 2016  . Pneumothorax 06/26/2017  . Pre-diabetes   . Thyroid disease     Past Surgical History:  Procedure Laterality Date  . ABDOMINAL HYSTERECTOMY    . bladder mesh     S/P bladder sling"  . CORONARY STENT INTERVENTION N/A 07/07/2017   Procedure: CORONARY STENT INTERVENTION;  Surgeon: Leonie Man, MD;  Location: [place] [facility];  Service: Cardiovascular;  Laterality: N/A;  . ELBOW SURGERY Right    "dislocation"  . INCONTINENCE SURGERY     "didn't work"  . INTRAVASCULAR PRESSURE WIRE/FFR STUDY N/A 07/07/2017   Procedure: INTRAVASCULAR PRESSURE WIRE/FFR STUDY;  Surgeon: Leonie Man, MD;  Location: [place] [facility];  Service:  Cardiovascular;  Laterality: N/A;  . JOINT REPLACEMENT    . LEFT HEART CATH AND CORONARY ANGIOGRAPHY N/A 07/07/2017   Procedure: LEFT HEART CATH AND CORONARY ANGIOGRAPHY;  Surgeon: Leonie Man, MD;  Location: [place] [facility];  Service: Cardiovascular;  Laterality: N/A;  . SPINAL FUSION  ~ 1970 X 3   "below neck - lower back"  . TONSILLECTOMY    . TOTAL HIP ARTHROPLASTY Left     There were no vitals filed for this visit.   Subjective Assessment - 04/25/20 1348    Subjective Pt states her neck feels better, back is "a little achey". Pt states she drove to [place] and  back with no real issues this past week.    Patient Stated Goals decrease pain in neck and back    Currently in Pain? Yes    Pain Score 6     Pain Location Back    Pain Orientation Mid;Lower    Pain Type Chronic pain    Pain Onset More than a month ago    Pain Frequency Intermittent                             [facility] Adult PT Treatment/Exercise - 04/25/20 0001      Neck Exercises: Theraband   Horizontal ABduction 20  reps;Red      Neck Exercises: Standing   Other Standing Exercises shoulder extension 2 x 10 red TB    Other Standing Exercises rows 2 x 10 red TB      Neck Exercises: Supine   Neck Retraction 10 reps;5 secs      Lumbar Exercises: Aerobic   Nustep L5 x 5 mins for warm up      Lumbar Exercises: Supine   Pelvic Tilt 10 reps;5 seconds    Clam 20 reps    Clam Limitations red TB    Bridge 20 reps    Bridge Limitations pain free range    Other Supine Lumbar Exercises lower trunk rotation x 20      Modalities   Modalities Electrical Stimulation;Moist Heat      Moist Heat Therapy   Number Minutes Moist Heat 10 Minutes    Moist Heat Location Cervical;Lumbar Spine      Electrical Stimulation   Electrical Stimulation Location low back    Electrical Stimulation Action IFC    Electrical Stimulation Parameters to tolerance    Electrical Stimulation Goals Pain      Manual  Therapy   Manual Therapy Soft tissue mobilization    Soft tissue mobilization STM bilat lumbar paraspinals, suboccipital release bilat                       PT Long Term Goals - 04/18/20 1625      PT LONG TERM GOAL #1   Title Pt will be independent with HEP    Time 6    Period Weeks    Status New    Target Date 05/30/20      PT LONG TERM GOAL #2   Title Pt will improve functional status measure in FOTO to >= 53 to demo improved functional mobility    Time 6    Period Weeks    Status New    Target Date 05/30/20      PT LONG TERM GOAL #3   Title Pt will drive or ride in car x 2 hours with pain <= 2/10    Time 6    Period Weeks    Status New    Target Date 05/30/20      PT LONG TERM GOAL #4   Title Pt will improve bilat LE strength to 4+/5 to tolerate standing and walking with decreased pain    Time 6    Period Weeks    Status New    Target Date 05/30/20                 Plan - 04/25/20 1435    Clinical Impression Statement Pt progressing well with cervical and lumbar exercises. Responds well to [facility] to lumbar and cervical paraspinals.    PT Treatment/Interventions Cryotherapy;Electrical Stimulation;Neuromuscular re-education;Moist Heat;Iontophoresis 4mg /ml Dexamethasone;Patient/family education;Manual techniques;Taping;Dry needling;Therapeutic exercise;Therapeutic activities;Passive range of motion    PT Next Visit Plan assess HEP, progress core strength, manual and modalities as indicated    PT Home Exercise Plan Access Code: [email or web address]    Consulted and Agree with Plan of Care Patient           Patient will benefit from skilled therapeutic intervention in order to improve the following deficits and impairments:     Visit Diagnosis: Cervicalgia  Muscle weakness (generalized)  Chronic bilateral low back pain without sciatica     Problem List Patient Active Problem List   Diagnosis Date Noted  . Acute  conjunctivitis of both eyes 01/23/2020   . 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  . Ingrown right greater toenail 07/19/2019  . Status post hip replacement, left 07/19/2019  . Left hip pain 07/19/2019  . OSA on CPAP 05/20/2019  . Snoring 05/02/2019  . Non-restorative sleep 05/02/2019  . Migraine without aura and without status migrainosus, not intractable 02/08/2019  . OAB (overactive bladder) 02/08/2019  . Diabetes mellitus without complication ([place]) [id number]  . Hyperlipidemia LDL goal <70 11/29/2018  . DJD (degenerative joint disease), lumbosacral 11/29/2018  . Pre-operative clearance 11/29/2018  . Nasal dryness 02/06/2018  . Arthralgia 02/05/2018  . Rhinorrhea 01/04/2018  . Excessive sweating 09/22/2017  . Tongue mass 08/14/2017  . Angina, class III ([place]) 07/07/2017  . S/P cardiac cath 07/07/2017  . CAD S/P percutaneous coronary angioplasty   . Pneumothorax 06/25/2017  . Atypical chest pain 05/24/2017  . Itchy scalp 05/24/2017  . Right leg pain 02/06/2017  . Chronic tension-type headache, intractable 02/05/2017  . Prediabetes 02/05/2017  . Lump in throat 01/27/2017  . Dysphagia 01/27/2017  . No energy 09/15/2016  . Bad taste in mouth 07/26/2016  . Paresthesia 04/03/2016  . Facet hypertrophy of lumbosacral region 04/03/2016  . Chronic back pain 04/03/2016  . Right knee pain 02/19/2016  . Rosacea 02/04/2016  . Vaginal atrophy 01/31/2016  . Idiopathic scoliosis 01/22/2016  . Hip bursitis 12/25/2015  . Biceps tendonitis on right 12/25/2015  . Diverticulitis of colon 12/21/2015  . GERD (gastroesophageal reflux disease) 11/27/2015  . Osteoporosis 11/27/2015  . GAD (generalized anxiety disorder) 11/27/2015  . MDD (major depressive disorder), recurrent, in full remission ([place]) 11/27/2015  . Chronic dryness of both eyes 11/27/2015  . Acquired hypothyroidism 11/27/2015  . Asthma, chronic 11/27/2015  . Insomnia 11/27/2015  . Seborrheic keratoses 11/27/2015  . CAD in native artery 11/27/2015    [facility], PT  Leslie Roberson 04/25/2020, 2:37 PM  [facility] [place], [place], [address] Phone: [phone]   Fax:  [phone]  Name: Leslie Roberson MRN: [id number] Date of Birth: 1955/08/19

## 2020-04-27 ENCOUNTER — Other Ambulatory Visit: Payer: Self-pay

## 2020-04-27 ENCOUNTER — Ambulatory Visit (INDEPENDENT_AMBULATORY_CARE_PROVIDER_SITE_OTHER): Payer: BC Managed Care – PPO | Admitting: Physical Therapy

## 2020-04-27 ENCOUNTER — Encounter: Payer: Self-pay | Admitting: Physical Therapy

## 2020-04-27 DIAGNOSIS — M542 Cervicalgia: Secondary | ICD-10-CM | POA: Diagnosis not present

## 2020-04-27 DIAGNOSIS — M545 Low back pain, unspecified: Secondary | ICD-10-CM

## 2020-04-27 DIAGNOSIS — G8929 Other chronic pain: Secondary | ICD-10-CM | POA: Diagnosis not present

## 2020-04-27 DIAGNOSIS — M6281 Muscle weakness (generalized): Secondary | ICD-10-CM | POA: Diagnosis not present

## 2020-04-27 NOTE — Therapy (Signed)
[facility] [place] [address] [place] [address] [place], [place], [address] Phone: [phone]   Fax:  [phone]  Physical Therapy Treatment  Patient Details  Name: Leslie Roberson MRN: [id number] Date of Birth: 07-26-1955 Referring Provider (PT): Letta Kocher   Encounter Date: 04/27/2020   PT End of Session - 04/27/20 1441    Visit Number 3    Number of Visits 12    Date for PT Re-Evaluation 05/30/20    PT Start Time 1400    PT Stop Time 1450    PT Time Calculation (min) 50 min    Activity Tolerance Patient tolerated treatment well    Behavior During Therapy [facility] for tasks assessed/performed           Past Medical History:  Diagnosis Date  . Anxiety   . Arthritis    "right little finger; base of thumb left hand; spine" (07/07/2017)  . Asthma, chronic 11/27/2015  . Chronic back pain   . Coronary artery disease    4/19 PCI/DEx2 to LAD, and PCI/DES x1 to RCA, normal EF  . Depression   . Diverticulitis of colon 12/21/2015   Colonoscopy every 5 years/digestive health.   . GERD (gastroesophageal reflux disease) 11/27/2015  . History of blood transfusion    "related to spinal fusions"  . Hypothyroidism   . Idiopathic scoliosis 01/22/2016  . Osteoporosis 11/27/2015   On prolia.   . Pneumonia 2016  . Pneumothorax 06/26/2017  . Pre-diabetes   . Thyroid disease     Past Surgical History:  Procedure Laterality Date  . ABDOMINAL HYSTERECTOMY    . bladder mesh     S/P bladder sling"  . CORONARY STENT INTERVENTION N/A 07/07/2017   Procedure: CORONARY STENT INTERVENTION;  Surgeon: Marykay Lex, MD;  Location: [facility] INVASIVE CV LAB;  Service: Cardiovascular;  Laterality: N/A;  . ELBOW SURGERY Right    "dislocation"  . INCONTINENCE SURGERY     "didn't work"  . INTRAVASCULAR PRESSURE WIRE/FFR STUDY N/A 07/07/2017   Procedure: INTRAVASCULAR PRESSURE WIRE/FFR STUDY;  Surgeon: Marykay Lex, MD;  Location: [facility] INVASIVE CV LAB;  Service:  Cardiovascular;  Laterality: N/A;  . JOINT REPLACEMENT    . LEFT HEART CATH AND CORONARY ANGIOGRAPHY N/A 07/07/2017   Procedure: LEFT HEART CATH AND CORONARY ANGIOGRAPHY;  Surgeon: Marykay Lex, MD;  Location: [facility] INVASIVE CV LAB;  Service: Cardiovascular;  Laterality: N/A;  . SPINAL FUSION  ~ 1970 X 3   "below neck - lower back"  . TONSILLECTOMY    . TOTAL HIP ARTHROPLASTY Left     There were no vitals filed for this visit.   Subjective Assessment - 04/27/20 1408    Subjective pt states she feels a "pinching" in her lower back. Neck feels good    Patient Stated Goals decrease pain in neck and back    Currently in Pain? Yes    Pain Score 3     Pain Location Back    Pain Orientation Lower    Pain Descriptors / Indicators Other (Comment)   pinching   Pain Onset More than a month ago                             [facility] Adult PT Treatment/Exercise - 04/27/20 0001      Neck Exercises: Theraband   Shoulder Extension 20 reps;Red    Rows 20 reps;Red    Horizontal ABduction 20 reps;Red  Lumbar Exercises: Stretches   Single Knee to Chest Stretch Right;2 reps;20 seconds      Lumbar Exercises: Aerobic   Nustep L5 x 5 mins for warm up      Lumbar Exercises: Supine   Pelvic Tilt 10 reps;5 seconds    Clam 20 reps    Clam Limitations red TB    Bridge 20 reps    Other Supine Lumbar Exercises lower trunk rotation x 20      Moist Heat Therapy   Number Minutes Moist Heat 10 Minutes    Moist Heat Location Cervical;Lumbar Spine      Electrical Stimulation   Electrical Stimulation Location low back    Electrical Stimulation Action IFC    Electrical Stimulation Parameters to tolerance    Electrical Stimulation Goals Pain      Manual Therapy   Soft tissue mobilization STM lumbar paraspinals, cervical paraspinals, suboccipitals                       PT Long Term Goals - 04/18/20 1625      PT LONG TERM GOAL #1   Title Pt will be independent with  HEP    Time 6    Period Weeks    Status New    Target Date 05/30/20      PT LONG TERM GOAL #2   Title Pt will improve functional status measure in FOTO to >= 53 to demo improved functional mobility    Time 6    Period Weeks    Status New    Target Date 05/30/20      PT LONG TERM GOAL #3   Title Pt will drive or ride in car x 2 hours with pain <= 2/10    Time 6    Period Weeks    Status New    Target Date 05/30/20      PT LONG TERM GOAL #4   Title Pt will improve bilat LE strength to 4+/5 to tolerate standing and walking with decreased pain    Time 6    Period Weeks    Status New    Target Date 05/30/20                 Plan - 04/27/20 1442    Clinical Impression Statement Pt with good posture and form during exercises, responds well to STM and estim.    PT Next Visit Plan add standing exercises, manual and modalities as indicated    PT Home Exercise Plan Access Code: [email or web address]    Consulted and Agree with Plan of Care Patient           Patient will benefit from skilled therapeutic intervention in order to improve the following deficits and impairments:     Visit Diagnosis: Cervicalgia  Muscle weakness (generalized)  Chronic bilateral low back pain without sciatica     Problem List Patient Active Problem List   Diagnosis Date Noted  . Acute conjunctivitis of both eyes 01/23/2020  . 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  . Ingrown right greater toenail 07/19/2019  . Status post hip replacement, left 07/19/2019  . Left hip pain 07/19/2019  . OSA on CPAP 05/20/2019  . Snoring 05/02/2019  . Non-restorative sleep 05/02/2019  . Migraine without aura and without status migrainosus, not intractable 02/08/2019  . OAB (overactive bladder) 02/08/2019  . Diabetes mellitus without complication ([place]) 96/29/5284  . Hyperlipidemia LDL goal <70 11/29/2018  . DJD (degenerative joint  disease), lumbosacral 11/29/2018  . Pre-operative clearance 11/29/2018   . Nasal dryness 02/06/2018  . Arthralgia 02/05/2018  . Rhinorrhea 01/04/2018  . Excessive sweating 09/22/2017  . Tongue mass 08/14/2017  . Angina, class III ([place]) 07/07/2017  . S/P cardiac cath 07/07/2017  . CAD S/P percutaneous coronary angioplasty   . Pneumothorax 06/25/2017  . Atypical chest pain 05/24/2017  . Itchy scalp 05/24/2017  . Right leg pain 02/06/2017  . Chronic tension-type headache, intractable 02/05/2017  . Prediabetes 02/05/2017  . Lump in throat 01/27/2017  . Dysphagia 01/27/2017  . No energy 09/15/2016  . Bad taste in mouth 07/26/2016  . Paresthesia 04/03/2016  . Facet hypertrophy of lumbosacral region 04/03/2016  . Chronic back pain 04/03/2016  . Right knee pain 02/19/2016  . Rosacea 02/04/2016  . Vaginal atrophy 01/31/2016  . Idiopathic scoliosis 01/22/2016  . Hip bursitis 12/25/2015  . Biceps tendonitis on right 12/25/2015  . Diverticulitis of colon 12/21/2015  . GERD (gastroesophageal reflux disease) 11/27/2015  . Osteoporosis 11/27/2015  . GAD (generalized anxiety disorder) 11/27/2015  . MDD (major depressive disorder), recurrent, in full remission ([place]) 11/27/2015  . Chronic dryness of both eyes 11/27/2015  . Acquired hypothyroidism 11/27/2015  . Asthma, chronic 11/27/2015  . Insomnia 11/27/2015  . Seborrheic keratoses 11/27/2015  . CAD in native artery 11/27/2015   [facility], PT  Rohail Klees 04/27/2020, 2:46 PM  [facility] [place], [place], [address] Phone: [phone]   Fax:  [phone]  Name: Freddy Spadafora MRN: [id number] Date of Birth: 1955-06-22

## 2020-04-29 ENCOUNTER — Other Ambulatory Visit: Payer: Self-pay | Admitting: Cardiology

## 2020-05-01 DIAGNOSIS — M5416 Radiculopathy, lumbar region: Secondary | ICD-10-CM | POA: Diagnosis not present

## 2020-05-01 DIAGNOSIS — M5136 Other intervertebral disc degeneration, lumbar region: Secondary | ICD-10-CM | POA: Diagnosis not present

## 2020-05-02 ENCOUNTER — Ambulatory Visit: Payer: BC Managed Care – PPO | Admitting: Physical Therapy

## 2020-05-02 ENCOUNTER — Other Ambulatory Visit: Payer: Self-pay

## 2020-05-02 ENCOUNTER — Encounter: Payer: Self-pay | Admitting: Physical Therapy

## 2020-05-02 DIAGNOSIS — M542 Cervicalgia: Secondary | ICD-10-CM

## 2020-05-02 DIAGNOSIS — M6281 Muscle weakness (generalized): Secondary | ICD-10-CM | POA: Diagnosis not present

## 2020-05-02 DIAGNOSIS — M545 Low back pain, unspecified: Secondary | ICD-10-CM | POA: Diagnosis not present

## 2020-05-02 DIAGNOSIS — G8929 Other chronic pain: Secondary | ICD-10-CM | POA: Diagnosis not present

## 2020-05-02 NOTE — Therapy (Signed)
[place], [place], [address] Phone: [phone]   Fax:  [phone]  Physical Therapy Treatment  Patient Details  Name: Leslie Roberson MRN: [id number] Date of Birth: 21-Mar-1956 Referring Provider (PT): Zonia Kief   Encounter Date: 05/02/2020   PT End of Session - 05/02/20 1441    Visit Number 4    Number of Visits 12    Date for PT Re-Evaluation 05/30/20    PT Start Time 7510    PT Stop Time 1526    PT Time Calculation (min) 50 min    Activity Tolerance Patient tolerated treatment well    Behavior During Therapy [facility] for tasks assessed/performed           Past Medical History:  Diagnosis Date  . Anxiety   . Arthritis    "right little finger; base of thumb left hand; spine" (07/07/2017)  . Asthma, chronic 11/27/2015  . Chronic back pain   . Coronary artery disease    4/19 PCI/DEx2 to LAD, and PCI/DES x1 to RCA, normal EF  . Depression   . Diverticulitis of colon 12/21/2015   Colonoscopy every 5 years/digestive health.   . GERD (gastroesophageal reflux disease) 11/27/2015  . History of blood transfusion    "related to spinal fusions"  . Hypothyroidism   . Idiopathic scoliosis 01/22/2016  . Osteoporosis 11/27/2015   On prolia.   . Pneumonia 2016  . Pneumothorax 06/26/2017  . Pre-diabetes   . Thyroid disease     Past Surgical History:  Procedure Laterality Date  . ABDOMINAL HYSTERECTOMY    . bladder mesh     S/P bladder sling"  . CORONARY STENT INTERVENTION N/A 07/07/2017   Procedure: CORONARY STENT INTERVENTION;  Surgeon: Leonie Man, MD;  Location: [place] CV LAB;  Service: Cardiovascular;  Laterality: N/A;  . ELBOW SURGERY Right    "dislocation"  . INCONTINENCE SURGERY     "didn't work"  . INTRAVASCULAR PRESSURE WIRE/FFR STUDY N/A 07/07/2017   Procedure: INTRAVASCULAR PRESSURE WIRE/FFR STUDY;  Surgeon: Leonie Man, MD;  Location: [place] CV LAB;  Service:  Cardiovascular;  Laterality: N/A;  . JOINT REPLACEMENT    . LEFT HEART CATH AND CORONARY ANGIOGRAPHY N/A 07/07/2017   Procedure: LEFT HEART CATH AND CORONARY ANGIOGRAPHY;  Surgeon: Leonie Man, MD;  Location: [place] CV LAB;  Service: Cardiovascular;  Laterality: N/A;  . SPINAL FUSION  ~ 1970 X 3   "below neck - lower back"  . TONSILLECTOMY    . TOTAL HIP ARTHROPLASTY Left     There were no vitals filed for this visit.   Subjective Assessment - 05/02/20 1439    Subjective has some bursitis in the Rt hip and it is acting up now, the steriod shot has worn off.    Patient Stated Goals decrease pain in neck and back    Currently in Pain? No/denies   had toradol shot yesterday in hip and it is helping                            [facility] Adult PT Treatment/Exercise - 05/02/20 0001      Lumbar Exercises: Stretches   ITB Stretch Left;Right;3 reps;20 seconds   cross body with strap then hip adduction stetch   Other Lumbar Stretch Exercise thoracic mobilization - lying over rolled up yoga mat,overhead reach and T stretch , 10 reps knees rotating in /out -  windshield wiper stretch      Lumbar Exercises: Standing   Other Standing Lumbar Exercises trunk side bend isometric to correct currature - Rt toes on wall, UE on table, Lt UE reaching up wall, 2 sets of 5x10 sec isometric press down into Rt UE      Lumbar Exercises: Supine   Other Supine Lumbar Exercises 2x10 hooklying reaching to rt foot side bending      Lumbar Exercises: Sidelying   Other Sidelying Lumbar Exercises open booklying over rolled up yoga mat 10 reps      Moist Heat Therapy   Number Minutes Moist Heat 10 Minutes    Moist Heat Location Cervical;Lumbar Spine      Electrical Stimulation   Electrical Stimulation Location paraspinals lower thoracic through lumbar    Electrical Stimulation Action IFC    Electrical Stimulation Parameters to tolearnce    Electrical Stimulation Goals Tone                        PT Long Term Goals - 04/18/20 1625      PT LONG TERM GOAL #1   Title Pt will be independent with HEP    Time 6    Period Weeks    Status New    Target Date 05/30/20      PT LONG TERM GOAL #2   Title Pt will improve functional status measure in FOTO to >= 53 to demo improved functional mobility    Time 6    Period Weeks    Status New    Target Date 05/30/20      PT LONG TERM GOAL #3   Title Pt will drive or ride in car x 2 hours with pain <= 2/10    Time 6    Period Weeks    Status New    Target Date 05/30/20      PT LONG TERM GOAL #4   Title Pt will improve bilat LE strength to 4+/5 to tolerate standing and walking with decreased pain    Time 6    Period Weeks    Status New    Target Date 05/30/20                 Plan - 05/02/20 1518    Clinical Impression Statement Parisha Amey reports that her Rt hip has started to hurt and the tramodol shot from her MD helped her some.  She tolerated todays exercise without increase in pain.  She reports feeling more loose at end of tx.    Rehab Potential Good    PT Frequency 2x / week    PT Duration 6 weeks    PT Treatment/Interventions Cryotherapy;Electrical Stimulation;Neuromuscular re-education;Moist Heat;Iontophoresis 4mg /ml Dexamethasone;Patient/family education;Manual techniques;Taping;Dry needling;Therapeutic exercise;Therapeutic activities;Passive range of motion    PT Next Visit Plan add standing exercises, manual and modalities as indicated    PT Home Exercise Plan Access Code: [email or web address]    Consulted and Agree with Plan of Care Patient           Patient will benefit from skilled therapeutic intervention in order to improve the following deficits and impairments:  Decreased strength,Postural dysfunction,Decreased mobility,Impaired flexibility,Decreased activity tolerance,Pain  Visit Diagnosis: Cervicalgia  Muscle weakness (generalized)  Chronic bilateral low back pain without  sciatica  Acute right-sided low back pain without sciatica     Problem List Patient Active Problem List   Diagnosis Date Noted  . Acute conjunctivitis of both eyes 01/23/2020  .  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  . Ingrown right greater toenail 07/19/2019  . Status post hip replacement, left 07/19/2019  . Left hip pain 07/19/2019  . OSA on CPAP 05/20/2019  . Snoring 05/02/2019  . Non-restorative sleep 05/02/2019  . Migraine without aura and without status migrainosus, not intractable 02/08/2019  . OAB (overactive bladder) 02/08/2019  . Diabetes mellitus without complication ([place]) 27/78/2423  . Hyperlipidemia LDL goal <70 11/29/2018  . DJD (degenerative joint disease), lumbosacral 11/29/2018  . Pre-operative clearance 11/29/2018  . Nasal dryness 02/06/2018  . Arthralgia 02/05/2018  . Rhinorrhea 01/04/2018  . Excessive sweating 09/22/2017  . Tongue mass 08/14/2017  . Angina, class III ([place]) 07/07/2017  . S/P cardiac cath 07/07/2017  . CAD S/P percutaneous coronary angioplasty   . Pneumothorax 06/25/2017  . Atypical chest pain 05/24/2017  . Itchy scalp 05/24/2017  . Right leg pain 02/06/2017  . Chronic tension-type headache, intractable 02/05/2017  . Prediabetes 02/05/2017  . Lump in throat 01/27/2017  . Dysphagia 01/27/2017  . No energy 09/15/2016  . Bad taste in mouth 07/26/2016  . Paresthesia 04/03/2016  . Facet hypertrophy of lumbosacral region 04/03/2016  . Chronic back pain 04/03/2016  . Right knee pain 02/19/2016  . Rosacea 02/04/2016  . Vaginal atrophy 01/31/2016  . Idiopathic scoliosis 01/22/2016  . Hip bursitis 12/25/2015  . Biceps tendonitis on right 12/25/2015  . Diverticulitis of colon 12/21/2015  . GERD (gastroesophageal reflux disease) 11/27/2015  . Osteoporosis 11/27/2015  . GAD (generalized anxiety disorder) 11/27/2015  . MDD (major depressive disorder), recurrent, in full remission ([place]) 11/27/2015  . Chronic dryness of both eyes  11/27/2015  . Acquired hypothyroidism 11/27/2015  . Asthma, chronic 11/27/2015  . Insomnia 11/27/2015  . Seborrheic keratoses 11/27/2015  . CAD in native artery 11/27/2015    Manuela Schwartz Soniya Ashraf PT  05/02/2020, 3:22 PM  [facility] [place], [place], [address] Phone: [phone]   Fax:  [phone]  Name: Brooklee Michelin MRN: [id number] Date of Birth: 1956-02-18

## 2020-05-04 ENCOUNTER — Other Ambulatory Visit: Payer: Self-pay

## 2020-05-04 ENCOUNTER — Ambulatory Visit: Payer: BC Managed Care – PPO | Admitting: Physical Therapy

## 2020-05-04 DIAGNOSIS — M545 Low back pain, unspecified: Secondary | ICD-10-CM

## 2020-05-04 DIAGNOSIS — G8929 Other chronic pain: Secondary | ICD-10-CM | POA: Diagnosis not present

## 2020-05-04 DIAGNOSIS — M6281 Muscle weakness (generalized): Secondary | ICD-10-CM

## 2020-05-04 DIAGNOSIS — M542 Cervicalgia: Secondary | ICD-10-CM | POA: Diagnosis not present

## 2020-05-04 NOTE — Therapy (Signed)
[place], [place], [address] Phone: [phone]   Fax:  [phone]  Physical Therapy Treatment  Patient Details  Name: Leslie Roberson MRN: [id number] Date of Birth: 24-Nov-1955 Referring Provider (PT): Zonia Kief   Encounter Date: 05/04/2020   PT End of Session - 05/04/20 1356    Visit Number 5    Number of Visits 12    Date for PT Re-Evaluation 05/30/20    PT Start Time 1315    PT Stop Time 1356    PT Time Calculation (min) 41 min    Activity Tolerance Patient tolerated treatment well    Behavior During Therapy [facility] for tasks assessed/performed           Past Medical History:  Diagnosis Date  . Anxiety   . Arthritis    "right little finger; base of thumb left hand; spine" (07/07/2017)  . Asthma, chronic 11/27/2015  . Chronic back pain   . Coronary artery disease    4/19 PCI/DEx2 to LAD, and PCI/DES x1 to RCA, normal EF  . Depression   . Diverticulitis of colon 12/21/2015   Colonoscopy every 5 years/digestive health.   . GERD (gastroesophageal reflux disease) 11/27/2015  . History of blood transfusion    "related to spinal fusions"  . Hypothyroidism   . Idiopathic scoliosis 01/22/2016  . Osteoporosis 11/27/2015   On prolia.   . Pneumonia 2016  . Pneumothorax 06/26/2017  . Pre-diabetes   . Thyroid disease     Past Surgical History:  Procedure Laterality Date  . ABDOMINAL HYSTERECTOMY    . bladder mesh     S/P bladder sling"  . CORONARY STENT INTERVENTION N/A 07/07/2017   Procedure: CORONARY STENT INTERVENTION;  Surgeon: Leonie Man, MD;  Location: [place] CV LAB;  Service: Cardiovascular;  Laterality: N/A;  . ELBOW SURGERY Right    "dislocation"  . INCONTINENCE SURGERY     "didn't work"  . INTRAVASCULAR PRESSURE WIRE/FFR STUDY N/A 07/07/2017   Procedure: INTRAVASCULAR PRESSURE WIRE/FFR STUDY;  Surgeon: Leonie Man, MD;  Location: [place] CV LAB;  Service:  Cardiovascular;  Laterality: N/A;  . JOINT REPLACEMENT    . LEFT HEART CATH AND CORONARY ANGIOGRAPHY N/A 07/07/2017   Procedure: LEFT HEART CATH AND CORONARY ANGIOGRAPHY;  Surgeon: Leonie Man, MD;  Location: [place] CV LAB;  Service: Cardiovascular;  Laterality: N/A;  . SPINAL FUSION  ~ 1970 X 3   "below neck - lower back"  . TONSILLECTOMY    . TOTAL HIP ARTHROPLASTY Left     There were no vitals filed for this visit.   Subjective Assessment - 05/04/20 1316    Subjective Rt hip is still bothersome, unable to get another steroid shot for 2 months    Patient Stated Goals decrease pain in neck and back    Currently in Pain? Yes    Pain Score 3     Pain Location Hip    Pain Orientation Right    Pain Descriptors / Indicators Aching    Pain Type Chronic pain                             [facility] Adult PT Treatment/Exercise - 05/04/20 0001      Neck Exercises: Standing   Other Standing Exercises rows 2 x 10 red TB      Lumbar Exercises: Stretches   Single Knee to Chest Stretch  Right;Left;20 seconds    ITB Stretch Right;Left;2 reps;30 seconds   cross body with strap     Lumbar Exercises: Aerobic   Nustep L5 x 5 mins for warm up      Lumbar Exercises: Standing   Wall Slides Limitations mini squat x 10 with 5# KB    Shoulder Adduction Limitations trunk side bend to reduce curvature    Other Standing Lumbar Exercises pallof press x 10 bilat with red TB    Other Standing Lumbar Exercises dead lift 5# KB x 10      Lumbar Exercises: Supine   Pelvic Tilt 5 reps;5 seconds    Pelvic Tilt Limitations then x 10 pelvic tilt with marching LEs    Clam 20 reps    Clam Limitations green TB    Bridge with clamshell 10 reps   green TB                 PT Education - 05/04/20 1341    Education Details updated HEP    Person(s) Educated Patient    Methods Explanation;Demonstration;Handout    Comprehension Verbalized understanding;Returned demonstration                PT Long Term Goals - 04/18/20 1625      PT LONG TERM GOAL #1   Title Pt will be independent with HEP    Time 6    Period Weeks    Status New    Target Date 05/30/20      PT LONG TERM GOAL #2   Title Pt will improve functional status measure in FOTO to >= 53 to demo improved functional mobility    Time 6    Period Weeks    Status New    Target Date 05/30/20      PT LONG TERM GOAL #3   Title Pt will drive or ride in car x 2 hours with pain <= 2/10    Time 6    Period Weeks    Status New    Target Date 05/30/20      PT LONG TERM GOAL #4   Title Pt will improve bilat LE strength to 4+/5 to tolerate standing and walking with decreased pain    Time 6    Period Weeks    Status New    Target Date 05/30/20                 Plan - 05/04/20 1357    Clinical Impression Statement Pt with difficulty with squats. PT educated pt on importance of using a squat technique to perform lifting at home to decrease strain on back. Pt with good tolerance to exercise progression    PT Next Visit Plan squats, lifting, core strength    PT Home Exercise Plan Access Code: [email or web address]    Consulted and Agree with Plan of Care Patient           Patient will benefit from skilled therapeutic intervention in order to improve the following deficits and impairments:     Visit Diagnosis: Cervicalgia  Muscle weakness (generalized)  Chronic bilateral low back pain without sciatica     Problem List Patient Active Problem List   Diagnosis Date Noted  . Acute conjunctivitis of both eyes 01/23/2020  . 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  . Ingrown right greater toenail 07/19/2019  . Status post hip replacement, left 07/19/2019  . Left hip pain 07/19/2019  . OSA on CPAP 05/20/2019  . Snoring  05/02/2019  . Non-restorative sleep 05/02/2019  . Migraine without aura and without status migrainosus, not intractable 02/08/2019  . OAB (overactive bladder) 02/08/2019  .  Diabetes mellitus without complication ([place]) 02/40/9735  . Hyperlipidemia LDL goal <70 11/29/2018  . DJD (degenerative joint disease), lumbosacral 11/29/2018  . Pre-operative clearance 11/29/2018  . Nasal dryness 02/06/2018  . Arthralgia 02/05/2018  . Rhinorrhea 01/04/2018  . Excessive sweating 09/22/2017  . Tongue mass 08/14/2017  . Angina, class III ([place]) 07/07/2017  . S/P cardiac cath 07/07/2017  . CAD S/P percutaneous coronary angioplasty   . Pneumothorax 06/25/2017  . Atypical chest pain 05/24/2017  . Itchy scalp 05/24/2017  . Right leg pain 02/06/2017  . Chronic tension-type headache, intractable 02/05/2017  . Prediabetes 02/05/2017  . Lump in throat 01/27/2017  . Dysphagia 01/27/2017  . No energy 09/15/2016  . Bad taste in mouth 07/26/2016  . Paresthesia 04/03/2016  . Facet hypertrophy of lumbosacral region 04/03/2016  . Chronic back pain 04/03/2016  . Right knee pain 02/19/2016  . Rosacea 02/04/2016  . Vaginal atrophy 01/31/2016  . Idiopathic scoliosis 01/22/2016  . Hip bursitis 12/25/2015  . Biceps tendonitis on right 12/25/2015  . Diverticulitis of colon 12/21/2015  . GERD (gastroesophageal reflux disease) 11/27/2015  . Osteoporosis 11/27/2015  . GAD (generalized anxiety disorder) 11/27/2015  . MDD (major depressive disorder), recurrent, in full remission ([place]) 11/27/2015  . Chronic dryness of both eyes 11/27/2015  . Acquired hypothyroidism 11/27/2015  . Asthma, chronic 11/27/2015  . Insomnia 11/27/2015  . Seborrheic keratoses 11/27/2015  . CAD in native artery 11/27/2015   [facility], PT  Stevey Stapleton 05/04/2020, 1:59 PM  [facility] [place], [place], [address] Phone: [phone]   Fax:  [phone]  Name: Leslie Roberson MRN: [id number] Date of Birth: May 17, 1955

## 2020-05-04 NOTE — Patient Instructions (Signed)
Access Code: [email or web address] URL: [email or web address] Date: 05/04/2020 Prepared by: Isabelle Course  Exercises Lower Trunk Rotations - 1 x daily - 7 x weekly - 3 sets - 10 reps Supine Chin Tuck - 1 x daily - 7 x weekly - 1 sets - 10 reps - 3-5 seconds hold Supine Shoulder Horizontal Abduction with Resistance - 1 x daily - 7 x weekly - 3 sets - 10 reps Bridge with Hip Abduction and Resistance - 1 x daily - 7 x weekly - 3 sets - 10 reps Supine March with Posterior Pelvic Tilt - 1 x daily - 7 x weekly - 3 sets - 10 reps Standing Bilateral Low Shoulder Row with Anchored Resistance - 1 x daily - 7 x weekly - 3 sets - 10 reps Standing Anti-Rotation Press with Anchored Resistance - 1 x daily - 7 x weekly - 2 sets - 10 reps

## 2020-05-08 ENCOUNTER — Other Ambulatory Visit: Payer: Self-pay | Admitting: Physician Assistant

## 2020-05-08 ENCOUNTER — Ambulatory Visit: Payer: BC Managed Care – PPO | Admitting: Physician Assistant

## 2020-05-08 DIAGNOSIS — M810 Age-related osteoporosis without current pathological fracture: Secondary | ICD-10-CM | POA: Diagnosis not present

## 2020-05-08 DIAGNOSIS — E785 Hyperlipidemia, unspecified: Secondary | ICD-10-CM | POA: Diagnosis not present

## 2020-05-08 DIAGNOSIS — E039 Hypothyroidism, unspecified: Secondary | ICD-10-CM | POA: Diagnosis not present

## 2020-05-08 DIAGNOSIS — Z131 Encounter for screening for diabetes mellitus: Secondary | ICD-10-CM | POA: Diagnosis not present

## 2020-05-09 ENCOUNTER — Ambulatory Visit (INDEPENDENT_AMBULATORY_CARE_PROVIDER_SITE_OTHER): Payer: BC Managed Care – PPO | Admitting: Physical Therapy

## 2020-05-09 ENCOUNTER — Other Ambulatory Visit: Payer: Self-pay

## 2020-05-09 DIAGNOSIS — M542 Cervicalgia: Secondary | ICD-10-CM | POA: Diagnosis not present

## 2020-05-09 DIAGNOSIS — M6281 Muscle weakness (generalized): Secondary | ICD-10-CM

## 2020-05-09 DIAGNOSIS — M545 Low back pain, unspecified: Secondary | ICD-10-CM

## 2020-05-09 DIAGNOSIS — G8929 Other chronic pain: Secondary | ICD-10-CM | POA: Diagnosis not present

## 2020-05-09 LAB — LIPID PANEL W/REFLEX DIRECT LDL
Cholesterol: 135 mg/dL (ref <200)
HDL: 80 mg/dL (ref >=50)
LDL Cholesterol (Calc): 41 mg/dL (calc)
Non-HDL Cholesterol (Calc): 55 mg/dL (calc) (ref <130)
Total CHOL/HDL Ratio: 1.7 (calc) (ref <5.0)
Triglycerides: 68 mg/dL (ref <150)

## 2020-05-09 LAB — COMPLETE METABOLIC PANEL WITH GFR
AG Ratio: 1.8 (calc) (ref 1.0–2.5)
ALT: 20 U/L (ref 6–29)
AST: 17 U/L (ref 10–35)
Albumin: 4.2 g/dL (ref 3.6–5.1)
Alkaline phosphatase (APISO): 60 U/L (ref 37–153)
BUN: 17 mg/dL (ref 7–25)
CO2: 32 mmol/L (ref 20–32)
Calcium: 9.6 mg/dL (ref 8.6–10.4)
Chloride: 104 mmol/L (ref 98–110)
Creat: 0.82 mg/dL (ref 0.50–0.99)
GFR, Est African American: 88 mL/min/{1.73_m2} (ref >=60)
GFR, Est Non African American: 76 mL/min/{1.73_m2} (ref >=60)
Globulin: 2.3 g/dL (calc) (ref 1.9–3.7)
Glucose, Bld: 112 mg/dL — ABNORMAL HIGH (ref 65–99)
Potassium: 4.3 mmol/L (ref 3.5–5.3)
Sodium: 143 mmol/L (ref 135–146)
Total Bilirubin: 0.4 mg/dL (ref 0.2–1.2)
Total Protein: 6.5 g/dL (ref 6.1–8.1)

## 2020-05-09 LAB — TSH: TSH: 0.53 mIU/L (ref 0.40–4.50)

## 2020-05-09 NOTE — Patient Instructions (Signed)
Access Code: [email or web address] URL: [email or web address] Date: 05/09/2020 Prepared by: Isabelle Course  Exercises Supine Chin Tuck - 1 x daily - 7 x weekly - 1 sets - 10 reps - 3-5 seconds hold Hooklying Single Knee to Chest Stretch - 1 x daily - 7 x weekly - 3 sets - 1 reps - 2-30 sec hold Supine Shoulder Horizontal Abduction with Resistance - 1 x daily - 7 x weekly - 3 sets - 10 reps Bridge with Hip Abduction and Resistance - 1 x daily - 7 x weekly - 3 sets - 10 reps Supine March with Posterior Pelvic Tilt - 1 x daily - 7 x weekly - 3 sets - 10 reps Standing Bilateral Low Shoulder Row with Anchored Resistance - 1 x daily - 7 x weekly - 3 sets - 10 reps Standing Anti-Rotation Press with Anchored Resistance - 1 x daily - 7 x weekly - 2 sets - 10 reps

## 2020-05-09 NOTE — Progress Notes (Signed)
Cholesterol great.  Thyroid stable.  Kidney and liver look awesome.  Next visit in person will check A1C. Glucose just slightly elevated.

## 2020-05-09 NOTE — Therapy (Signed)
[place], [place], [address] Phone: [phone]   Fax:  [phone]  Physical Therapy Treatment  Patient Details  Name: Leslie Roberson MRN: [id number] Date of Birth: 1955/11/02 Referring Provider (PT): Zonia Kief   Encounter Date: 05/09/2020   PT End of Session - 05/09/20 1515    Visit Number 6    Number of Visits 12    Date for PT Re-Evaluation 05/30/20    PT Start Time 1430    PT Stop Time 1510    PT Time Calculation (min) 40 min    Activity Tolerance Patient tolerated treatment well    Behavior During Therapy [facility] for tasks assessed/performed           Past Medical History:  Diagnosis Date  . Anxiety   . Arthritis    "right little finger; base of thumb left hand; spine" (07/07/2017)  . Asthma, chronic 11/27/2015  . Chronic back pain   . Coronary artery disease    4/19 PCI/DEx2 to LAD, and PCI/DES x1 to RCA, normal EF  . Depression   . Diverticulitis of colon 12/21/2015   Colonoscopy every 5 years/digestive health.   . GERD (gastroesophageal reflux disease) 11/27/2015  . History of blood transfusion    "related to spinal fusions"  . Hypothyroidism   . Idiopathic scoliosis 01/22/2016  . Osteoporosis 11/27/2015   On prolia.   . Pneumonia 2016  . Pneumothorax 06/26/2017  . Pre-diabetes   . Thyroid disease     Past Surgical History:  Procedure Laterality Date  . ABDOMINAL HYSTERECTOMY    . bladder mesh     S/P bladder sling"  . CORONARY STENT INTERVENTION N/A 07/07/2017   Procedure: CORONARY STENT INTERVENTION;  Surgeon: Leonie Man, MD;  Location: [place] [facility];  Service: Cardiovascular;  Laterality: N/A;  . ELBOW SURGERY Right    "dislocation"  . INCONTINENCE SURGERY     "didn't work"  . INTRAVASCULAR PRESSURE WIRE/FFR STUDY N/A 07/07/2017   Procedure: INTRAVASCULAR PRESSURE WIRE/FFR STUDY;  Surgeon: Leonie Man, MD;  Location: [place] [facility];  Service:  Cardiovascular;  Laterality: N/A;  . JOINT REPLACEMENT    . LEFT HEART CATH AND CORONARY ANGIOGRAPHY N/A 07/07/2017   Procedure: LEFT HEART CATH AND CORONARY ANGIOGRAPHY;  Surgeon: Leonie Man, MD;  Location: [place] [facility];  Service: Cardiovascular;  Laterality: N/A;  . SPINAL FUSION  ~ 1970 X 3   "below neck - lower back"  . TONSILLECTOMY    . TOTAL HIP ARTHROPLASTY Left     There were no vitals filed for this visit.   Subjective Assessment - 05/09/20 1434    Subjective Pt states her knees have been bothering her and that her back feels "about the same" She states she saw MD and he plans to do an "ablasion" in her back. She has had this procedure before    Patient Stated Goals decrease pain in neck and back    Pain Score 3     Pain Location Knee    Pain Orientation Right    Pain Descriptors / Indicators Aching    Pain Type Chronic pain    Pain Frequency Intermittent              [facility] PT Assessment - 05/09/20 0001      Strength   Right Hip Flexion 4-/5    Left Hip Flexion 4+/5    Left Hip Extension 4-/5  [place] Adult PT Treatment/Exercise - 05/09/20 0001      Ambulation/Gait   Gait Comments resisted walking backward and laterally both sides x 5 red TB      Lumbar Exercises: Stretches   Single Knee to Chest Stretch Right;Left;2 reps;20 seconds    Other Lumbar Stretch Exercise seated with UEs on theraball forward flexion and Rt sidebending stretch 3 x 10sec each      Lumbar Exercises: Aerobic   Nustep L5 x 5 mins for warm up      Lumbar Exercises: Standing   Shoulder Adduction Limitations trunk side bend Lt UE reaching up while Rt UE pushing down into table    Other Standing Lumbar Exercises pallof press x 10 bilat red TB    Other Standing Lumbar Exercises dead lift 5#KB 2 x 10      Lumbar Exercises: Supine   Pelvic Tilt Limitations pelvic tilt with march x 10 with tactile cues    Bridge with clamshell 20 reps   green  TB   Other Supine Lumbar Exercises 2 x 5 hooklying reaching to Rt foot for sidebending stretch      Lumbar Exercises: Sidelying   Other Sidelying Lumbar Exercises open book x 10                       PT Long Term Goals - 04/18/20 1625      PT LONG TERM GOAL #1   Title Pt will be independent with HEP    Time 6    Period Weeks    Status New    Target Date 05/30/20      PT LONG TERM GOAL #2   Title Pt will improve functional status measure in FOTO to >= 53 to demo improved functional mobility    Time 6    Period Weeks    Status New    Target Date 05/30/20      PT LONG TERM GOAL #3   Title Pt will drive or ride in car x 2 hours with pain <= 2/10    Time 6    Period Weeks    Status New    Target Date 05/30/20      PT LONG TERM GOAL #4   Title Pt will improve bilat LE strength to 4+/5 to tolerate standing and walking with decreased pain    Time 6    Period Weeks    Status New    Target Date 05/30/20                 Plan - 05/09/20 1517    Clinical Impression Statement Pt continues with intermittent back pain. pt is improving core and LE strength and activity tolerance. session focused on stretching as pt states this really helps her pain    PT Next Visit Plan update HEP with stretches and core strength and lifting    PT Home Exercise Plan Access Code: [email or web address]           Patient will benefit from skilled therapeutic intervention in order to improve the following deficits and impairments:     Visit Diagnosis: Muscle weakness (generalized)  Cervicalgia  Chronic bilateral low back pain without sciatica     Problem List Patient Active Problem List   Diagnosis Date Noted  . Acute conjunctivitis of both eyes 01/23/2020  . 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  . Ingrown right greater toenail 07/19/2019  . Status post hip replacement, left 07/19/2019  .  Left hip pain 07/19/2019  . OSA on CPAP 05/20/2019  . Snoring 05/02/2019  .  Non-restorative sleep 05/02/2019  . Migraine without aura and without status migrainosus, not intractable 02/08/2019  . OAB (overactive bladder) 02/08/2019  . Diabetes mellitus without complication ([place]) 24/26/8341  . Hyperlipidemia LDL goal <70 11/29/2018  . DJD (degenerative joint disease), lumbosacral 11/29/2018  . Pre-operative clearance 11/29/2018  . Nasal dryness 02/06/2018  . Arthralgia 02/05/2018  . Rhinorrhea 01/04/2018  . Excessive sweating 09/22/2017  . Tongue mass 08/14/2017  . Angina, class III ([place]) 07/07/2017  . S/P cardiac cath 07/07/2017  . CAD S/P percutaneous coronary angioplasty   . Pneumothorax 06/25/2017  . Atypical chest pain 05/24/2017  . Itchy scalp 05/24/2017  . Right leg pain 02/06/2017  . Chronic tension-type headache, intractable 02/05/2017  . Prediabetes 02/05/2017  . Lump in throat 01/27/2017  . Dysphagia 01/27/2017  . No energy 09/15/2016  . Bad taste in mouth 07/26/2016  . Paresthesia 04/03/2016  . Facet hypertrophy of lumbosacral region 04/03/2016  . Chronic back pain 04/03/2016  . Right knee pain 02/19/2016  . Rosacea 02/04/2016  . Vaginal atrophy 01/31/2016  . Idiopathic scoliosis 01/22/2016  . Hip bursitis 12/25/2015  . Biceps tendonitis on right 12/25/2015  . Diverticulitis of colon 12/21/2015  . GERD (gastroesophageal reflux disease) 11/27/2015  . Osteoporosis 11/27/2015  . GAD (generalized anxiety disorder) 11/27/2015  . MDD (major depressive disorder), recurrent, in full remission ([place]) 11/27/2015  . Chronic dryness of both eyes 11/27/2015  . Acquired hypothyroidism 11/27/2015  . Asthma, chronic 11/27/2015  . Insomnia 11/27/2015  . Seborrheic keratoses 11/27/2015  . CAD in native artery 11/27/2015   [facility], PT  Devante Capano 05/09/2020, 4:04 PM  [facility] [place], [place], [address] Phone: [phone]   Fax:  [phone]  Name: Leslie Roberson MRN: [id number] Date of Birth: 07/20/1955

## 2020-05-11 ENCOUNTER — Ambulatory Visit: Payer: BC Managed Care – PPO | Admitting: Physical Therapy

## 2020-05-11 ENCOUNTER — Other Ambulatory Visit: Payer: Self-pay

## 2020-05-11 DIAGNOSIS — M545 Low back pain, unspecified: Secondary | ICD-10-CM | POA: Diagnosis not present

## 2020-05-11 DIAGNOSIS — M6281 Muscle weakness (generalized): Secondary | ICD-10-CM | POA: Diagnosis not present

## 2020-05-11 DIAGNOSIS — M542 Cervicalgia: Secondary | ICD-10-CM

## 2020-05-11 DIAGNOSIS — G8929 Other chronic pain: Secondary | ICD-10-CM

## 2020-05-11 NOTE — Therapy (Signed)
[place], [place], [address] Phone: [phone]   Fax:  [phone]  Physical Therapy Treatment  Patient Details  Name: Leslie Roberson MRN: [id number] Date of Birth: Nov 29, 1955 Referring Provider (PT): Zonia Kief   Encounter Date: 05/11/2020   PT End of Session - 05/11/20 1438    Visit Number 7    Number of Visits 12    Date for PT Re-Evaluation 05/30/20    PT Start Time 1400    PT Stop Time 1440    PT Time Calculation (min) 40 min    Activity Tolerance Patient tolerated treatment well    Behavior During Therapy [facility] for tasks assessed/performed           Past Medical History:  Diagnosis Date  . Anxiety   . Arthritis    "right little finger; base of thumb left hand; spine" (07/07/2017)  . Asthma, chronic 11/27/2015  . Chronic back pain   . Coronary artery disease    4/19 PCI/DEx2 to LAD, and PCI/DES x1 to RCA, normal EF  . Depression   . Diverticulitis of colon 12/21/2015   Colonoscopy every 5 years/digestive health.   . GERD (gastroesophageal reflux disease) 11/27/2015  . History of blood transfusion    "related to spinal fusions"  . Hypothyroidism   . Idiopathic scoliosis 01/22/2016  . Osteoporosis 11/27/2015   On prolia.   . Pneumonia 2016  . Pneumothorax 06/26/2017  . Pre-diabetes   . Thyroid disease     Past Surgical History:  Procedure Laterality Date  . ABDOMINAL HYSTERECTOMY    . bladder mesh     S/P bladder sling"  . CORONARY STENT INTERVENTION N/A 07/07/2017   Procedure: CORONARY STENT INTERVENTION;  Surgeon: Leonie Man, MD;  Location: [place] [facility];  Service: Cardiovascular;  Laterality: N/A;  . ELBOW SURGERY Right    "dislocation"  . INCONTINENCE SURGERY     "didn't work"  . INTRAVASCULAR PRESSURE WIRE/FFR STUDY N/A 07/07/2017   Procedure: INTRAVASCULAR PRESSURE WIRE/FFR STUDY;  Surgeon: Leonie Man, MD;  Location: [place] [facility];  Service:  Cardiovascular;  Laterality: N/A;  . JOINT REPLACEMENT    . LEFT HEART CATH AND CORONARY ANGIOGRAPHY N/A 07/07/2017   Procedure: LEFT HEART CATH AND CORONARY ANGIOGRAPHY;  Surgeon: Leonie Man, MD;  Location: [place] [facility];  Service: Cardiovascular;  Laterality: N/A;  . SPINAL FUSION  ~ 1970 X 3   "below neck - lower back"  . TONSILLECTOMY    . TOTAL HIP ARTHROPLASTY Left     There were no vitals filed for this visit.   Subjective Assessment - 05/11/20 1359    Subjective I am feel good today. I always feel better when it is warm out. Pt is having an ablasion on 06/11/20    Patient Stated Goals decrease pain in neck and back    Pain Score 2     Pain Location Back    Pain Orientation Right;Lower    Pain Descriptors / Indicators Sore                             [facility] Adult PT Treatment/Exercise - 05/11/20 0001      Ambulation/Gait   Gait Comments resisted walking red TB bkwd and laterally x 5 each, sidestep wtih red TB around ankles 5 steps x 5 each direction      Lumbar Exercises: Stretches  Single Knee to Chest Stretch Right;Left;2 reps;30 seconds    Other Lumbar Stretch Exercise seated with UEs on theraball forward flexion and Rt sidebend 5 x 5sec each      Lumbar Exercises: Aerobic   Nustep L5 x 5 mins for warm up      Lumbar Exercises: Standing   Lifting Weights (lbs) deadlift 5# KB 2 x 10    Shoulder Adduction Limitations trunk side bend with Rt UE pushing on table and Lt UE reaching up    Other Standing Lumbar Exercises pallof press x 10 bilat red TB    Other Standing Lumbar Exercises lateral step ups x 10 bilat 4'' step      Lumbar Exercises: Supine   Pelvic Tilt Limitations pelvic tilt with march x 10    Clam 20 reps    Clam Limitations green TB    Bridge with clamshell 10 reps   green TB   Other Supine Lumbar Exercises 2 x 5 hooklying reaching to Rt heel x 10 seconds      Lumbar Exercises: Sidelying   Other Sidelying Lumbar Exercises  open book x 10 bilat                       PT Long Term Goals - 04/18/20 1625      PT LONG TERM GOAL #1   Title Pt will be independent with HEP    Time 6    Period Weeks    Status New    Target Date 05/30/20      PT LONG TERM GOAL #2   Title Pt will improve functional status measure in FOTO to >= 53 to demo improved functional mobility    Time 6    Period Weeks    Status New    Target Date 05/30/20      PT LONG TERM GOAL #3   Title Pt will drive or ride in car x 2 hours with pain <= 2/10    Time 6    Period Weeks    Status New    Target Date 05/30/20      PT LONG TERM GOAL #4   Title Pt will improve bilat LE strength to 4+/5 to tolerate standing and walking with decreased pain    Time 6    Period Weeks    Status New    Target Date 05/30/20                 Plan - 05/11/20 1440    Clinical Impression Statement pt is progressing well with core strengthening. continued to focus on stretching today    PT Next Visit Plan update HEP. add lifting activities    PT Home Exercise Plan Access Code: [email or web address]    Consulted and Agree with Plan of Care Patient           Patient will benefit from skilled therapeutic intervention in order to improve the following deficits and impairments:     Visit Diagnosis: Muscle weakness (generalized)  Cervicalgia  Chronic bilateral low back pain without sciatica     Problem List Patient Active Problem List   Diagnosis Date Noted  . Acute conjunctivitis of both eyes 01/23/2020  . 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  . Ingrown right greater toenail 07/19/2019  . Status post hip replacement, left 07/19/2019  . Left hip pain 07/19/2019  . OSA on CPAP 05/20/2019  . Snoring 05/02/2019  . Non-restorative sleep 05/02/2019  .  Migraine without aura and without status migrainosus, not intractable 02/08/2019  . OAB (overactive bladder) 02/08/2019  . Diabetes mellitus without complication ([place]) 04/02/7251   . Hyperlipidemia LDL goal <70 11/29/2018  . DJD (degenerative joint disease), lumbosacral 11/29/2018  . Pre-operative clearance 11/29/2018  . Nasal dryness 02/06/2018  . Arthralgia 02/05/2018  . Rhinorrhea 01/04/2018  . Excessive sweating 09/22/2017  . Tongue mass 08/14/2017  . Angina, class III ([place]) 07/07/2017  . S/P cardiac cath 07/07/2017  . CAD S/P percutaneous coronary angioplasty   . Pneumothorax 06/25/2017  . Atypical chest pain 05/24/2017  . Itchy scalp 05/24/2017  . Right leg pain 02/06/2017  . Chronic tension-type headache, intractable 02/05/2017  . Prediabetes 02/05/2017  . Lump in throat 01/27/2017  . Dysphagia 01/27/2017  . No energy 09/15/2016  . Bad taste in mouth 07/26/2016  . Paresthesia 04/03/2016  . Facet hypertrophy of lumbosacral region 04/03/2016  . Chronic back pain 04/03/2016  . Right knee pain 02/19/2016  . Rosacea 02/04/2016  . Vaginal atrophy 01/31/2016  . Idiopathic scoliosis 01/22/2016  . Hip bursitis 12/25/2015  . Biceps tendonitis on right 12/25/2015  . Diverticulitis of colon 12/21/2015  . GERD (gastroesophageal reflux disease) 11/27/2015  . Osteoporosis 11/27/2015  . GAD (generalized anxiety disorder) 11/27/2015  . MDD (major depressive disorder), recurrent, in full remission ([place]) 11/27/2015  . Chronic dryness of both eyes 11/27/2015  . Acquired hypothyroidism 11/27/2015  . Asthma, chronic 11/27/2015  . Insomnia 11/27/2015  . Seborrheic keratoses 11/27/2015  . CAD in native artery 11/27/2015   Authur Cubit, PT  Hardie Veltre 05/11/2020, 2:42 PM  [facility] [place], [place], [address] Phone: [phone]   Fax:  [phone]  Name: Camera Krienke MRN: [id number] Date of Birth: December 08, 1955

## 2020-05-14 ENCOUNTER — Ambulatory Visit (INDEPENDENT_AMBULATORY_CARE_PROVIDER_SITE_OTHER): Payer: BC Managed Care – PPO

## 2020-05-14 ENCOUNTER — Encounter: Payer: Self-pay | Admitting: Physician Assistant

## 2020-05-14 ENCOUNTER — Ambulatory Visit (INDEPENDENT_AMBULATORY_CARE_PROVIDER_SITE_OTHER): Payer: BC Managed Care – PPO | Admitting: Physician Assistant

## 2020-05-14 ENCOUNTER — Telehealth: Payer: Self-pay | Admitting: Physician Assistant

## 2020-05-14 ENCOUNTER — Telehealth: Payer: Self-pay | Admitting: Neurology

## 2020-05-14 ENCOUNTER — Other Ambulatory Visit: Payer: Self-pay

## 2020-05-14 VITALS — BP 112/61 | HR 73 | Ht <= 58 in | Wt 127.0 lb

## 2020-05-14 DIAGNOSIS — M810 Age-related osteoporosis without current pathological fracture: Secondary | ICD-10-CM

## 2020-05-14 DIAGNOSIS — R0601 Orthopnea: Secondary | ICD-10-CM | POA: Diagnosis not present

## 2020-05-14 DIAGNOSIS — I2510 Atherosclerotic heart disease of native coronary artery without angina pectoris: Secondary | ICD-10-CM

## 2020-05-14 DIAGNOSIS — R0602 Shortness of breath: Secondary | ICD-10-CM | POA: Diagnosis not present

## 2020-05-14 DIAGNOSIS — E118 Type 2 diabetes mellitus with unspecified complications: Secondary | ICD-10-CM

## 2020-05-14 DIAGNOSIS — Z9861 Coronary angioplasty status: Secondary | ICD-10-CM

## 2020-05-14 DIAGNOSIS — R0600 Dyspnea, unspecified: Secondary | ICD-10-CM | POA: Insufficient documentation

## 2020-05-14 DIAGNOSIS — R059 Cough, unspecified: Secondary | ICD-10-CM | POA: Diagnosis not present

## 2020-05-14 LAB — POCT GLYCOSYLATED HEMOGLOBIN (HGB A1C): Hemoglobin A1C: 6.2 % — AB (ref 4.0–5.6)

## 2020-05-14 IMAGING — DX DG CHEST 2V
2 series · 2 of 2 positions shown · non-contrast
Comparison: Chest radiograph July 06, 2017

CLINICAL DATA: Cough and shortness of breath x1 month, 64-year-old
female

EXAM:
CHEST - 2 VIEW

[chest pa]
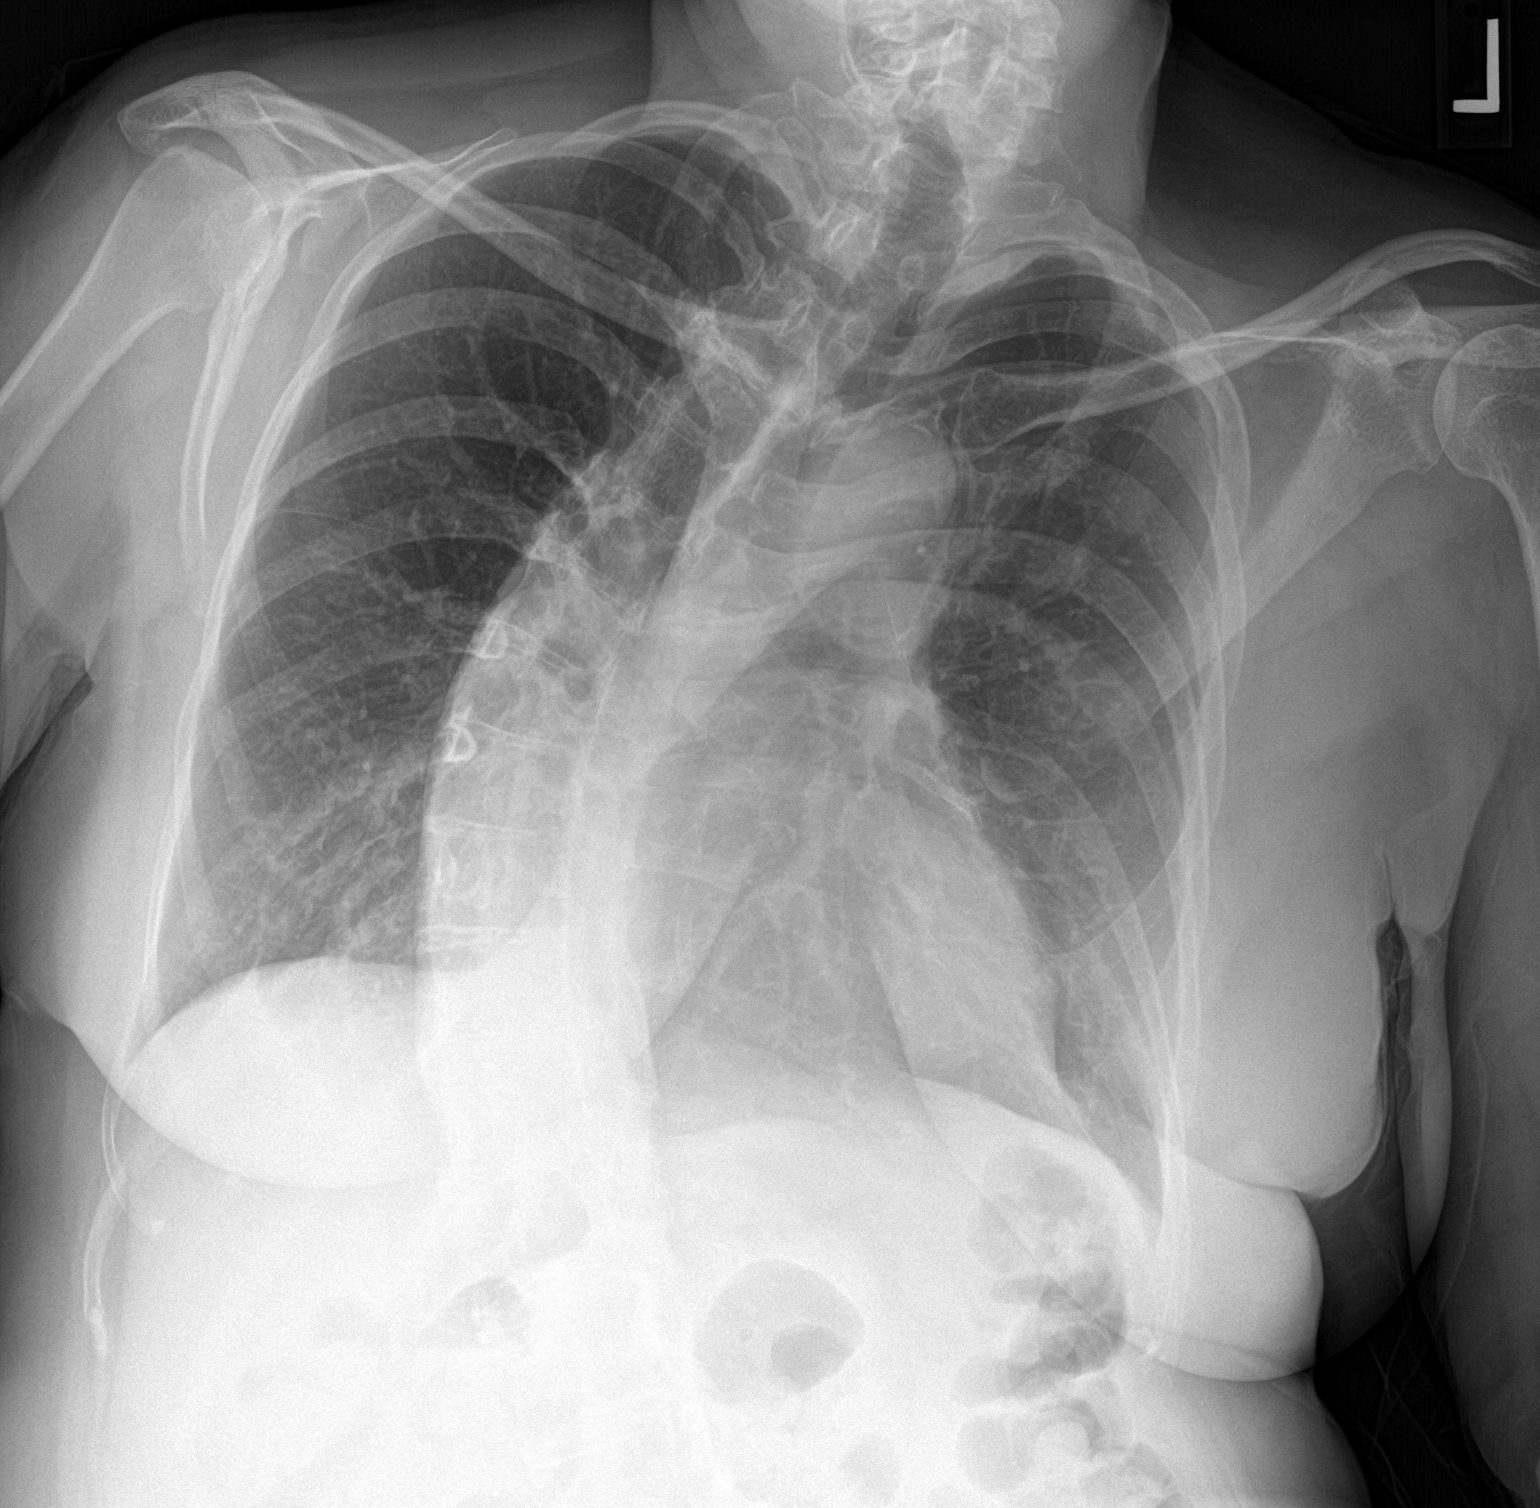

[chest lat]
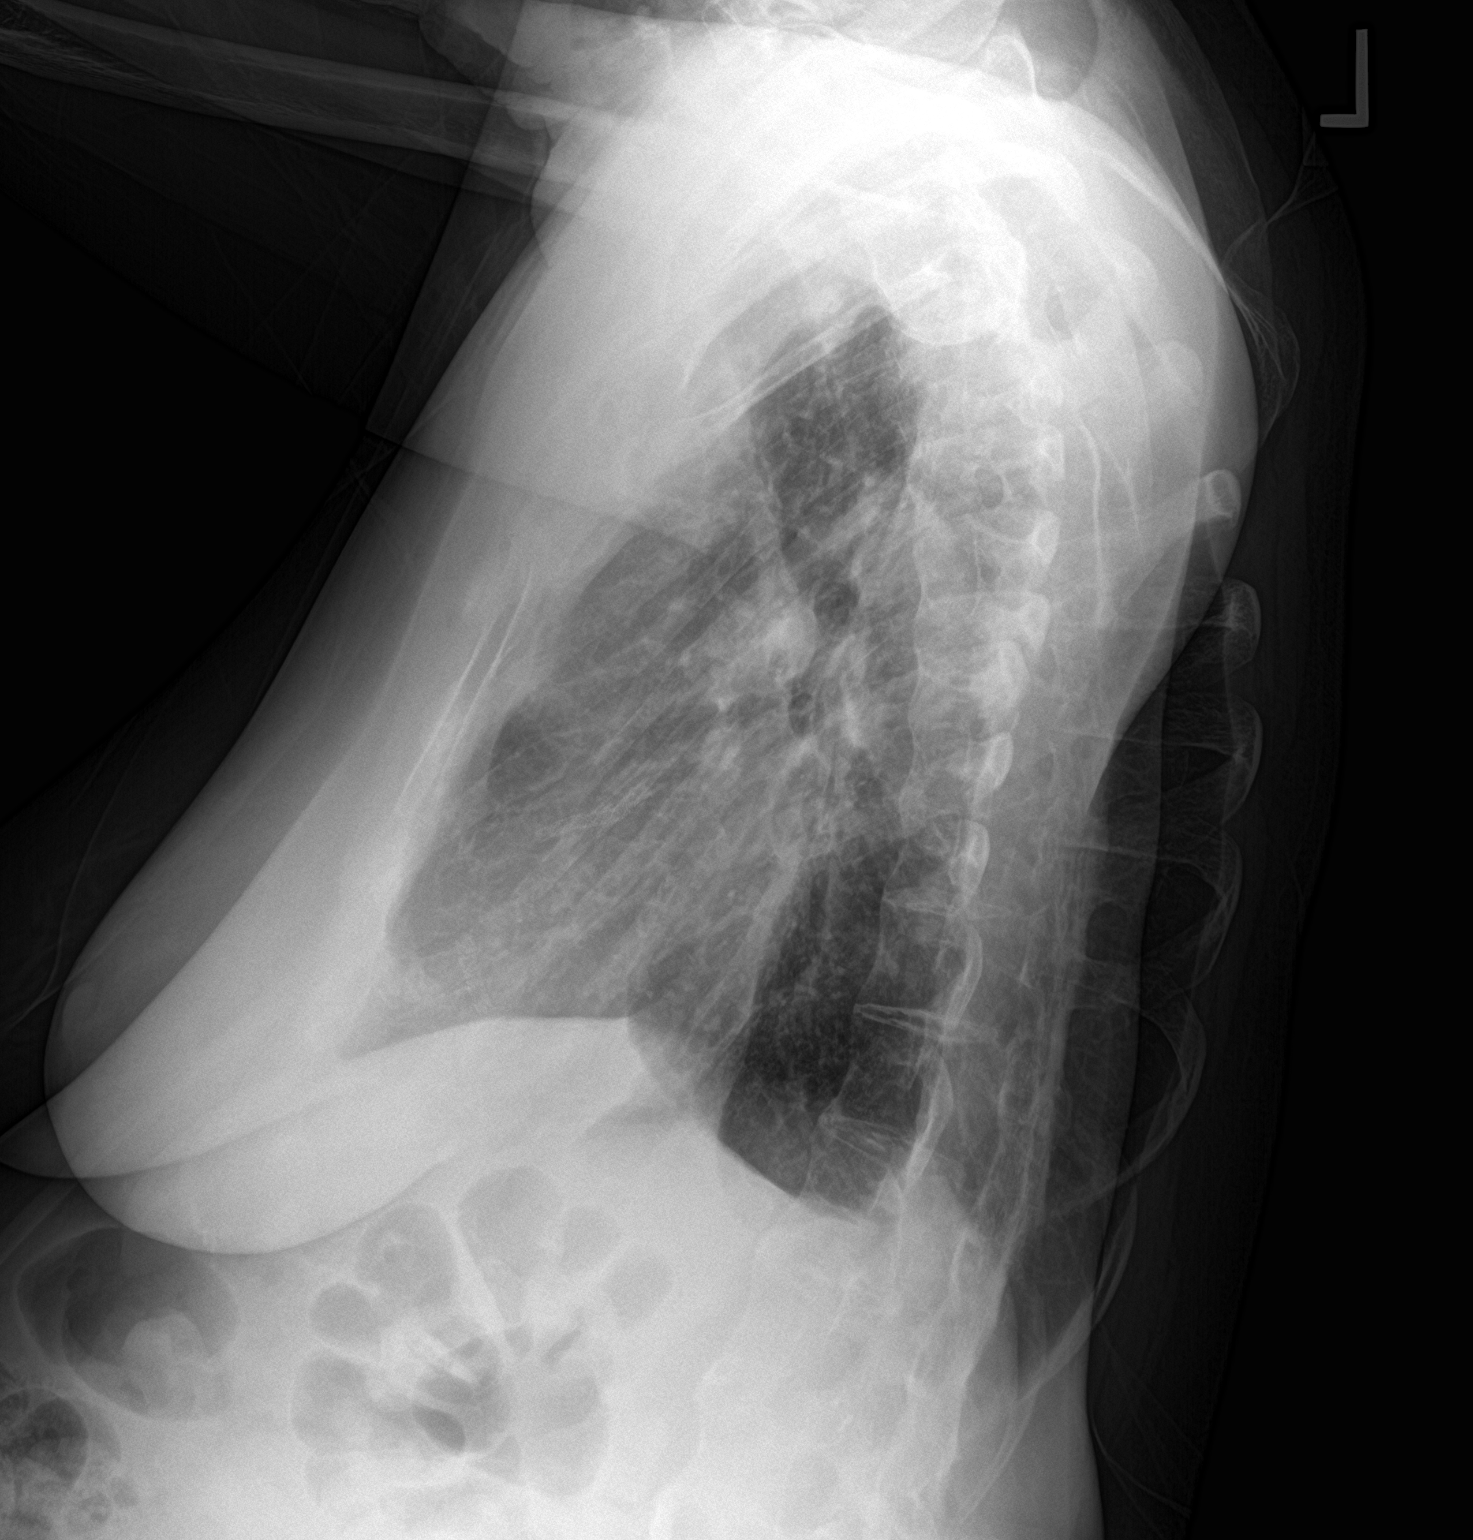

[2 of 2 positions shown; findings below may reference images not displayed]

FINDINGS: The heart size and mediastinal contours are within normal limits.
Aortic atherosclerosis. No focal consolidation. No pleural effusion.
No pneumothorax. Marked dextroconvex thoracolumbar scoliosis.
IMPRESSION: No active cardiopulmonary disease.

## 2020-05-14 NOTE — Telephone Encounter (Signed)
Patient came in on 05/14/2020 for visit with Leslie Roberson to include Prolia. She asked about insurance authorization and Amber and I could not find information about authorization in her chart. She chose to wait until we received authorization. Can you look into this?

## 2020-05-14 NOTE — Progress Notes (Signed)
Subjective:    Patient ID: Leslie Roberson, female    DOB: 01-06-1956, 65 y.o.   MRN: [id number]  HPI  Pt is a 65 yo female with CAD, migraines, asthma, OSA, T2DM, GERD, hypothyroidism,  osteoporosis, scolosis with chronic back pain, GAD who presents to the clinic for follow up.   She was due for prolia today but but insurance company has not approved yet.   She is doing well on metformin. Not checking sugars. Denies any hypoglycemic events. No open sores or wounds. She tries to keep to low carb/sugar diet.   Pt denies any CP or palpitations but she does report some SOB and chest tightness when she lays flat on her back at night. Denies any lower ext edema. No SOB with exertion. She does cough a bit of stuff up every morning and night. Never had an echo.   .. Active Ambulatory Problems    Diagnosis Date Noted  . GERD (gastroesophageal reflux disease) 11/27/2015  . Osteoporosis 11/27/2015  . GAD (generalized anxiety disorder) 11/27/2015  . MDD (major depressive disorder), recurrent, in full remission ([place]) 11/27/2015  . Chronic dryness of both eyes 11/27/2015  . Acquired hypothyroidism 11/27/2015  . Asthma, chronic 11/27/2015  . Insomnia 11/27/2015  . Seborrheic keratoses 11/27/2015  . Diverticulitis of colon 12/21/2015  . Hip bursitis 12/25/2015  . Biceps tendonitis on right 12/25/2015  . Idiopathic scoliosis 01/22/2016  . Vaginal atrophy 01/31/2016  . Rosacea 02/04/2016  . Right knee pain 02/19/2016  . Paresthesia 04/03/2016  . Facet hypertrophy of lumbosacral region 04/03/2016  . Chronic back pain 04/03/2016  . Bad taste in mouth 07/26/2016  . No energy 09/15/2016  . Lump in throat 01/27/2017  . Dysphagia 01/27/2017  . Chronic tension-type headache, intractable 02/05/2017  . Prediabetes 02/05/2017  . Right leg pain 02/06/2017  . Atypical chest pain 05/24/2017  . Itchy scalp 05/24/2017  . Pneumothorax 06/25/2017  . Angina, class III ([place]) 07/07/2017  . CAD S/P  percutaneous coronary angioplasty   . Tongue mass 08/14/2017  . Excessive sweating 09/22/2017  . Rhinorrhea 01/04/2018  . Arthralgia 02/05/2018  . Nasal dryness 02/06/2018  . Hyperlipidemia LDL goal <70 11/29/2018  . DJD (degenerative joint disease), lumbosacral 11/29/2018  . Pre-operative clearance 11/29/2018  . Diabetes mellitus without complication ([place]) 34/19/6222  . Migraine without aura and without status migrainosus, not intractable 02/08/2019  . OAB (overactive bladder) 02/08/2019  . Snoring 05/02/2019  . Non-restorative sleep 05/02/2019  . OSA on CPAP 05/20/2019  . Ingrown right greater toenail 07/19/2019  . Status post hip replacement, left 07/19/2019  . Left hip pain 07/19/2019  . 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  . Acute conjunctivitis of both eyes 01/23/2020  . CAD in native artery 11/27/2015  . S/P cardiac cath 07/07/2017  . Paroxysmal nocturnal dyspnea 05/14/2020  . Orthopnea 05/14/2020  . Cough 05/18/2020   Resolved Ambulatory Problems    Diagnosis Date Noted  . Acute medial meniscus tear 04/23/2016  . Left knee pain 04/28/2016  . Mild intermittent asthma without complication 97/98/9211   Past Medical History:  Diagnosis Date  . Anxiety   . Arthritis   . Coronary artery disease   . Depression   . History of blood transfusion   . Hypothyroidism   . Pneumonia 2016  . Pre-diabetes   . Thyroid disease     Review of Systems See HPI.     Objective:   Physical Exam Vitals reviewed.  Constitutional:  Appearance: Normal appearance.  Neck:     Vascular: No carotid bruit.  Cardiovascular:     Rate and Rhythm: Normal rate and regular rhythm.     Pulses: Normal pulses.     Heart sounds: Normal heart sounds. No murmur heard.   Pulmonary:     Effort: Pulmonary effort is normal.     Breath sounds: Normal breath sounds.  Musculoskeletal:     Cervical back: Normal range of motion and neck supple.     Right lower leg: No edema.      Left lower leg: No edema.  Neurological:     General: No focal deficit present.     Mental Status: She is alert.  Psychiatric:        Mood and Affect: Mood normal.       .. Results for orders placed or performed in visit on 05/14/20  POCT glycosylated hemoglobin (Hb A1C)  Result Value Ref Range   Hemoglobin A1C 6.2 (A) 4.0 - 5.6 %   HbA1c POC (<> result, manual entry)     HbA1c, POC (prediabetic range)     HbA1c, POC (controlled diabetic range)         Assessment & Plan:  Marland KitchenMarland KitchenCamani was seen today for diabetes and osteoporosis.  Diagnoses and all orders for this visit:  Controlled type 2 diabetes mellitus with complication, without long-term current use of insulin (HCC) -     POCT glycosylated hemoglobin (Hb A1C)  Age-related osteoporosis without current pathological fracture  Cough -     DG Chest 2 View  Orthopnea -     ECHOCARDIOGRAM COMPLETE -     Cardiac Stress Test: Informed Consent Details: Physician/Practitioner Attestation; Transcribe to consent form and obtain patient signature  CAD in native artery  CAD S/P percutaneous coronary angioplasty   A1C to goal, stable.  Continue metformin.  BP to goal. On ACE. On statin.  Foot exam UtD.  Eye exam UTD.  Pneumonia/flu/covid UTD.  Follow up in 6 months.   Pt has new SOB at night. No hx of echo. Hx of CAD. No CP. Ordered echo and cXR to evaluate for any heart failure.   Will call when insurance has approved prolia to get back in for injection. CMP done within 30 days.

## 2020-05-15 NOTE — Progress Notes (Signed)
Normal CXR! No acute findings.

## 2020-05-16 ENCOUNTER — Other Ambulatory Visit: Payer: Self-pay

## 2020-05-16 ENCOUNTER — Ambulatory Visit: Payer: BC Managed Care – PPO | Admitting: Physical Therapy

## 2020-05-16 DIAGNOSIS — G8929 Other chronic pain: Secondary | ICD-10-CM | POA: Diagnosis not present

## 2020-05-16 DIAGNOSIS — M6281 Muscle weakness (generalized): Secondary | ICD-10-CM | POA: Diagnosis not present

## 2020-05-16 DIAGNOSIS — M542 Cervicalgia: Secondary | ICD-10-CM | POA: Diagnosis not present

## 2020-05-16 DIAGNOSIS — M545 Low back pain, unspecified: Secondary | ICD-10-CM

## 2020-05-16 NOTE — Therapy (Signed)
[facility], [place], [address] Phone: [phone]   Fax:  [phone]  Physical Therapy Treatment  Patient Details  Name: Leslie Roberson MRN: [id number] Date of Birth: 11-22-1955 Referring Provider (PT): Zonia Kief   Encounter Date: 05/16/2020   PT End of Session - 05/16/20 1432    Visit Number 8    Number of Visits 12    Date for PT Re-Evaluation 05/30/20    PT Start Time 5465    PT Stop Time 1425    PT Time Calculation (min) 40 min    Activity Tolerance Patient tolerated treatment well    Behavior During Therapy [facility] for tasks assessed/performed           Past Medical History:  Diagnosis Date  . Anxiety   . Arthritis    "right little finger; base of thumb left hand; spine" (07/07/2017)  . Asthma, chronic 11/27/2015  . Chronic back pain   . Coronary artery disease    4/19 PCI/DEx2 to LAD, and PCI/DES x1 to RCA, normal EF  . Depression   . Diverticulitis of colon 12/21/2015   Colonoscopy every 5 years/digestive health.   . GERD (gastroesophageal reflux disease) 11/27/2015  . History of blood transfusion    "related to spinal fusions"  . Hypothyroidism   . Idiopathic scoliosis 01/22/2016  . Osteoporosis 11/27/2015   On prolia.   . Pneumonia 2016  . Pneumothorax 06/26/2017  . Pre-diabetes   . Thyroid disease     Past Surgical History:  Procedure Laterality Date  . ABDOMINAL HYSTERECTOMY    . bladder mesh     S/P bladder sling"  . CORONARY STENT INTERVENTION N/A 07/07/2017   Procedure: CORONARY STENT INTERVENTION;  Surgeon: Leonie Man, MD;  Location: [facility];  Service: Cardiovascular;  Laterality: N/A;  . ELBOW SURGERY Right    "dislocation"  . INCONTINENCE SURGERY     "didn't work"  . INTRAVASCULAR PRESSURE WIRE/FFR STUDY N/A 07/07/2017   Procedure: INTRAVASCULAR PRESSURE WIRE/FFR STUDY;  Surgeon: Leonie Man, MD;  Location: [name];  Service:  Cardiovascular;  Laterality: N/A;  . JOINT REPLACEMENT    . LEFT HEART CATH AND CORONARY ANGIOGRAPHY N/A 07/07/2017   Procedure: LEFT HEART CATH AND CORONARY ANGIOGRAPHY;  Surgeon: Leonie Man, MD;  Location: [facility];  Service: Cardiovascular;  Laterality: N/A;  . SPINAL FUSION  ~ 1970 X 3   "below neck - lower back"  . TONSILLECTOMY    . TOTAL HIP ARTHROPLASTY Left     There were no vitals filed for this visit.                      [facility] Adult PT Treatment/Exercise - 05/16/20 0001      Ambulation/Gait   Gait Comments resisted walking red TB bkwd and laterally x 10 each      Lumbar Exercises: Stretches   Single Knee to Chest Stretch Right;Left;2 reps;30 seconds    Other Lumbar Stretch Exercise seated with UEs on theraball stretch all directions 10x 3 sec      Lumbar Exercises: Aerobic   Nustep L5 x 5 mins for warmup      Lumbar Exercises: Machines for Strengthening   Leg Press x 20 3 plates      Lumbar Exercises: Standing   Lifting Weights (lbs) deadlift 5#KB x 20    Row Strengthening;Both;20 reps    Theraband Level (  Row) Level 2 (Red)    Shoulder Extension Strengthening;Both;20 reps    Theraband Level (Shoulder Extension) Level 2 (Red)    Other Standing Lumbar Exercises lateral step ups 2 x 10 bilat 4'' step      Lumbar Exercises: Supine   Bridge with clamshell 20 reps   greenTB   Bridge with March 10 reps    Other Supine Lumbar Exercises 2 x 5 hooklying reaching for Rt heel x 10 seconds      Lumbar Exercises: Sidelying   Other Sidelying Lumbar Exercises open book x 10 bilat                       PT Long Term Goals - 04/18/20 1625      PT LONG TERM GOAL #1   Title Pt will be independent with HEP    Time 6    Period Weeks    Status New    Target Date 05/30/20      PT LONG TERM GOAL #2   Title Pt will improve functional status measure in FOTO to >= 53 to demo improved functional mobility    Time 6    Period Weeks     Status New    Target Date 05/30/20      PT LONG TERM GOAL #3   Title Pt will drive or ride in car x 2 hours with pain <= 2/10    Time 6    Period Weeks    Status New    Target Date 05/30/20      PT LONG TERM GOAL #4   Title Pt will improve bilat LE strength to 4+/5 to tolerate standing and walking with decreased pain    Time 6    Period Weeks    Status New    Target Date 05/30/20                 Plan - 05/16/20 1433    Clinical Impression Statement Pt able to progress exercises well. Making good progress towards goals    PT Next Visit Plan update HEP, discharge    PT Home Exercise Plan Access Code: [email or web address]    Consulted and Agree with Plan of Care Patient           Patient will benefit from skilled therapeutic intervention in order to improve the following deficits and impairments:     Visit Diagnosis: Muscle weakness (generalized)  Cervicalgia  Chronic bilateral low back pain without sciatica     Problem List Patient Active Problem List   Diagnosis Date Noted  . Paroxysmal nocturnal dyspnea 05/14/2020  . Orthopnea 05/14/2020  . Acute conjunctivitis of both eyes 01/23/2020  . 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  . Ingrown right greater toenail 07/19/2019  . Status post hip replacement, left 07/19/2019  . Left hip pain 07/19/2019  . OSA on CPAP 05/20/2019  . Snoring 05/02/2019  . Non-restorative sleep 05/02/2019  . Migraine without aura and without status migrainosus, not intractable 02/08/2019  . OAB (overactive bladder) 02/08/2019  . Diabetes mellitus without complication ([place]) 95/63/8756  . Hyperlipidemia LDL goal <70 11/29/2018  . DJD (degenerative joint disease), lumbosacral 11/29/2018  . Pre-operative clearance 11/29/2018  . Nasal dryness 02/06/2018  . Arthralgia 02/05/2018  . Rhinorrhea 01/04/2018  . Excessive sweating 09/22/2017  . Tongue mass 08/14/2017  . Angina, class III ([place]) 07/07/2017  . S/P cardiac cath 07/07/2017   . CAD S/P percutaneous coronary angioplasty   .  Pneumothorax 06/25/2017  . Atypical chest pain 05/24/2017  . Itchy scalp 05/24/2017  . Right leg pain 02/06/2017  . Chronic tension-type headache, intractable 02/05/2017  . Prediabetes 02/05/2017  . Lump in throat 01/27/2017  . Dysphagia 01/27/2017  . No energy 09/15/2016  . Bad taste in mouth 07/26/2016  . Paresthesia 04/03/2016  . Facet hypertrophy of lumbosacral region 04/03/2016  . Chronic back pain 04/03/2016  . Right knee pain 02/19/2016  . Rosacea 02/04/2016  . Vaginal atrophy 01/31/2016  . Idiopathic scoliosis 01/22/2016  . Hip bursitis 12/25/2015  . Biceps tendonitis on right 12/25/2015  . Diverticulitis of colon 12/21/2015  . GERD (gastroesophageal reflux disease) 11/27/2015  . Osteoporosis 11/27/2015  . GAD (generalized anxiety disorder) 11/27/2015  . MDD (major depressive disorder), recurrent, in full remission ([place]) 11/27/2015  . Chronic dryness of both eyes 11/27/2015  . Acquired hypothyroidism 11/27/2015  . Asthma, chronic 11/27/2015  . Insomnia 11/27/2015  . Seborrheic keratoses 11/27/2015  . CAD in native artery 11/27/2015   [facility], PT  Jibri Schriefer 05/16/2020, 2:35 PM  [facility] [place], [place], [address] Phone: [phone]   Fax:  [phone]  Name: Satomi Buda MRN: [id number] Date of Birth: 1956/02/09

## 2020-05-18 ENCOUNTER — Ambulatory Visit: Payer: BC Managed Care – PPO | Admitting: Physical Therapy

## 2020-05-18 ENCOUNTER — Other Ambulatory Visit: Payer: Self-pay

## 2020-05-18 ENCOUNTER — Encounter: Payer: Self-pay | Admitting: Physician Assistant

## 2020-05-18 DIAGNOSIS — M545 Low back pain, unspecified: Secondary | ICD-10-CM | POA: Diagnosis not present

## 2020-05-18 DIAGNOSIS — G8929 Other chronic pain: Secondary | ICD-10-CM

## 2020-05-18 DIAGNOSIS — M6281 Muscle weakness (generalized): Secondary | ICD-10-CM | POA: Diagnosis not present

## 2020-05-18 DIAGNOSIS — R059 Cough, unspecified: Secondary | ICD-10-CM | POA: Insufficient documentation

## 2020-05-18 DIAGNOSIS — M542 Cervicalgia: Secondary | ICD-10-CM

## 2020-05-18 NOTE — Therapy (Signed)
[place], [place], [address] Phone: [phone]   Fax:  [phone]  Physical Therapy Treatment and Discharge  Patient Details  Name: Leslie Roberson MRN: [id number] Date of Birth: [birth year] Referring Provider (PT): Zonia Kief   Encounter Date: 05/18/2020   PT End of Session - 05/18/20 1430    Visit Number 9    Number of Visits 12    Date for PT Re-Evaluation 05/30/20    PT Start Time 1400    PT Stop Time 1430    PT Time Calculation (min) 30 min    Activity Tolerance Patient tolerated treatment well    Behavior During Therapy [facility] for tasks assessed/performed           Past Medical History:  Diagnosis Date  . Anxiety   . Arthritis    "right little finger; base of thumb left hand; spine" (07/07/2017)  . Asthma, chronic 11/27/2015  . Chronic back pain   . Coronary artery disease    4/19 PCI/DEx2 to LAD, and PCI/DES x1 to RCA, normal EF  . Depression   . Diverticulitis of colon 12/21/2015   Colonoscopy every 5 years/digestive health.   . GERD (gastroesophageal reflux disease) 11/27/2015  . History of blood transfusion    "related to spinal fusions"  . Hypothyroidism   . Idiopathic scoliosis 01/22/2016  . Osteoporosis 11/27/2015   On prolia.   . Pneumonia 2016  . Pneumothorax 06/26/2017  . Pre-diabetes   . Thyroid disease     Past Surgical History:  Procedure Laterality Date  . ABDOMINAL HYSTERECTOMY    . bladder mesh     S/P bladder sling"  . CORONARY STENT INTERVENTION N/A 07/07/2017   Procedure: CORONARY STENT INTERVENTION;  Surgeon: Leonie Man, MD;  Location: [facility];  Service: Cardiovascular;  Laterality: N/A;  . ELBOW SURGERY Right    "dislocation"  . INCONTINENCE SURGERY     "didn't work"  . INTRAVASCULAR PRESSURE WIRE/FFR STUDY N/A 07/07/2017   Procedure: INTRAVASCULAR PRESSURE WIRE/FFR STUDY;  Surgeon: Leonie Man, MD;  Location: [facility];  Service: Cardiovascular;  Laterality: N/A;  . JOINT REPLACEMENT    . LEFT HEART CATH AND CORONARY ANGIOGRAPHY N/A 07/07/2017   Procedure: LEFT HEART CATH AND CORONARY ANGIOGRAPHY;  Surgeon: Leonie Man, MD;  Location: [facility];  Service: Cardiovascular;  Laterality: N/A;  . SPINAL FUSION  ~ 1970 X 3   "below neck - lower back"  . TONSILLECTOMY    . TOTAL HIP ARTHROPLASTY Left     There were no vitals filed for this visit.   Subjective Assessment - 05/18/20 1405    Subjective Pt states "I feel good. The stretches help the most"    Patient Stated Goals decrease pain in neck and back    Currently in Pain? No/denies                             [facility] Adult PT Treatment/Exercise - 05/18/20 0001      Lumbar Exercises: Stretches   Single Knee to Chest Stretch Right;Left;2 reps;20 seconds    Other Lumbar Stretch Exercise seated with UEs on ball forward and lateral flexion 10 x 3 sec      Lumbar Exercises: Aerobic   Nustep L5 x 5 min for warm up      Lumbar Exercises: Standing   Row Strengthening;20 reps  Theraband Level (Row) Level 2 (Red)    Shoulder Extension Strengthening;20 reps    Theraband Level (Shoulder Extension) Level 2 (Red)    Shoulder Adduction Limitations trunk side bend with Rt UEpressing down into table      Lumbar Exercises: Supine   Pelvic Tilt Limitations pelvic tilt with march x 10    Clam 20 reps    Clam Limitations green TB    Other Supine Lumbar Exercises 2 x 5 hooklying reaching for Rt heel with 10 sec hold                  PT Education - 05/18/20 1429    Education Details D/C plan and HEP    Person(s) Educated Patient    Methods Explanation;Demonstration;Handout    Comprehension Returned demonstration;Verbalized understanding               PT Long Term Goals - 05/18/20 1422      PT LONG TERM GOAL #1   Title Pt will be independent with HEP    Status Achieved      PT LONG TERM GOAL #2   Title  Pt will improve functional status measure in FOTO to >= 53 to demo improved functional mobility    Baseline 57    Status Achieved      PT LONG TERM GOAL #3   Title Pt will drive or ride in car x 2 hours with pain <= 2/10    Baseline 1/10    Status Achieved      PT LONG TERM GOAL #4   Title Pt will improve bilat LE strength to 4+/5 to tolerate standing and walking with decreased pain    Baseline 4+/5 bilat hips    Status Achieved                 Plan - 05/18/20 1434    Clinical Impression Statement Pt has met all goals and is independent in HEP. Pt with no further PT needs at this time    PT Next Visit Plan Discharge from PT 05/18/20    PT Home Exercise Plan Access Code: [email or web address]    Consulted and Agree with Plan of Care Patient           Patient will benefit from skilled therapeutic intervention in order to improve the following deficits and impairments:     Visit Diagnosis: Muscle weakness (generalized)  Cervicalgia  Chronic bilateral low back pain without sciatica     Problem List Patient Active Problem List   Diagnosis Date Noted  . Cough 05/18/2020  . Paroxysmal nocturnal dyspnea 05/14/2020  . Orthopnea 05/14/2020  . Acute conjunctivitis of both eyes 01/23/2020  . 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  . Ingrown right greater toenail 07/19/2019  . Status post hip replacement, left 07/19/2019  . Left hip pain 07/19/2019  . OSA on CPAP 05/20/2019  . Snoring 05/02/2019  . Non-restorative sleep 05/02/2019  . Migraine without aura and without status migrainosus, not intractable 02/08/2019  . OAB (overactive bladder) 02/08/2019  . Diabetes mellitus without complication ([place]) 60/12/9321  . Hyperlipidemia LDL goal <70 11/29/2018  . DJD (degenerative joint disease), lumbosacral 11/29/2018  . Pre-operative clearance 11/29/2018  . Nasal dryness 02/06/2018  . Arthralgia 02/05/2018  . Rhinorrhea 01/04/2018  . Excessive sweating 09/22/2017  . Tongue  mass 08/14/2017  . Angina, class III ([place]) 07/07/2017  . S/P cardiac cath 07/07/2017  . CAD S/P percutaneous coronary angioplasty   . Pneumothorax 06/25/2017  .  Atypical chest pain 05/24/2017  . Itchy scalp 05/24/2017  . Right leg pain 02/06/2017  . Chronic tension-type headache, intractable 02/05/2017  . Prediabetes 02/05/2017  . Lump in throat 01/27/2017  . Dysphagia 01/27/2017  . No energy 09/15/2016  . Bad taste in mouth 07/26/2016  . Paresthesia 04/03/2016  . Facet hypertrophy of lumbosacral region 04/03/2016  . Chronic back pain 04/03/2016  . Right knee pain 02/19/2016  . Rosacea 02/04/2016  . Vaginal atrophy 01/31/2016  . Idiopathic scoliosis 01/22/2016  . Hip bursitis 12/25/2015  . Biceps tendonitis on right 12/25/2015  . Diverticulitis of colon 12/21/2015  . GERD (gastroesophageal reflux disease) 11/27/2015  . Osteoporosis 11/27/2015  . GAD (generalized anxiety disorder) 11/27/2015  . MDD (major depressive disorder), recurrent, in full remission ([place]) 11/27/2015  . Chronic dryness of both eyes 11/27/2015  . Acquired hypothyroidism 11/27/2015  . Asthma, chronic 11/27/2015  . Insomnia 11/27/2015  . Seborrheic keratoses 11/27/2015  . CAD in native artery 11/27/2015   PHYSICAL THERAPY DISCHARGE SUMMARY  Visits from Start of Care: 9  Current functional level related to goals / functional outcomes: Pt has improved ability to stand and sit with decreased pain   Remaining deficits: Difficulty walking > 5 minutes   Education / Equipment: HEP Plan: Patient agrees to discharge.  Patient goals were met. Patient is being discharged due to meeting the stated rehab goals.  ?????    Leslie Roberson, PT,DPT02/18/222:37 PM  Leslie Roberson 05/18/2020, 2:36 PM  [facility] [place], [place], [address] Phone: [phone]   Fax:  [phone]  Name: Leslie Roberson MRN: [id number] Date of Birth:  07/10/1955

## 2020-05-18 NOTE — Patient Instructions (Signed)
Access Code: [email or web address] URL: [email or web address] Date: 05/18/2020 Prepared by: Isabelle Course  Exercises Supine Chin Tuck - 1 x daily - 7 x weekly - 1 sets - 10 reps - 3-5 seconds hold Supine Shoulder Horizontal Abduction with Resistance - 1 x daily - 7 x weekly - 3 sets - 10 reps Gentle Levator Scapulae Stretch - 1 x daily - 7 x weekly - 3 sets - 10 reps Hooklying Single Knee to Chest Stretch - 1 x daily - 7 x weekly - 3 sets - 1 reps - 2-30 sec hold Supine March with Posterior Pelvic Tilt - 1 x daily - 7 x weekly - 3 sets - 10 reps Hooklying Clamshell with Resistance - 1 x daily - 7 x weekly - 3 sets - 10 reps Standing Lumbar Spine Flexion Stretch Counter - 1 x daily - 7 x weekly - 3 sets - 1 reps TL Sidebending Stretch - Single Arm Overhead - 1 x daily - 7 x weekly - 3 sets - 1 reps - 20-30 seconds hold Standing Bilateral Low Shoulder Row with Anchored Resistance - 1 x daily - 7 x weekly - 3 sets - 10 reps Shoulder extension with resistance - Neutral - 1 x daily - 7 x weekly - 3 sets - 10 reps

## 2020-05-21 ENCOUNTER — Telehealth: Payer: Self-pay | Admitting: *Deleted

## 2020-05-21 NOTE — Telephone Encounter (Signed)
PA initiated through [facility] for [place].  Waiting for response.

## 2020-05-21 NOTE — Telephone Encounter (Signed)
Prolia approved through [employer].  Start date 05/21/20-05/20/21.  Reference # [id number].

## 2020-05-21 NOTE — Telephone Encounter (Signed)
Prolia approved.  Can we please get pt scheduled?

## 2020-05-21 NOTE — Telephone Encounter (Signed)
PA started this morning.  Waiting for response.

## 2020-05-21 NOTE — Telephone Encounter (Signed)
Patient has prolia Inj schd on the nurse schd for 05/24/20. AM

## 2020-05-22 DIAGNOSIS — F333 Major depressive disorder, recurrent, severe with psychotic symptoms: Secondary | ICD-10-CM | POA: Diagnosis not present

## 2020-05-22 NOTE — Telephone Encounter (Signed)
See other note

## 2020-05-24 ENCOUNTER — Ambulatory Visit (INDEPENDENT_AMBULATORY_CARE_PROVIDER_SITE_OTHER): Payer: BC Managed Care – PPO | Admitting: Family Medicine

## 2020-05-24 ENCOUNTER — Other Ambulatory Visit: Payer: Self-pay

## 2020-05-24 VITALS — BP 118/72 | HR 67 | Wt 127.8 lb

## 2020-05-24 DIAGNOSIS — M810 Age-related osteoporosis without current pathological fracture: Secondary | ICD-10-CM | POA: Diagnosis not present

## 2020-05-24 MED ORDER — DENOSUMAB 60 MG/ML ~~LOC~~ SOSY
60.0000 mg | PREFILLED_SYRINGE | Freq: Once | SUBCUTANEOUS | Status: AC
  Administered 2020-05-24: 60 mg via SUBCUTANEOUS

## 2020-05-24 NOTE — Progress Notes (Signed)
Pt is here for a Prolia injection. Pt reports taking calcium and vitamin D daily. Pt's calcium levels and kidney function was within normal limits.   Pt tolerated injection well without complications.

## 2020-05-24 NOTE — Progress Notes (Signed)
Agree with documentation as above.   Fantasia Jinkins, MD  

## 2020-05-28 ENCOUNTER — Telehealth: Payer: Self-pay | Admitting: Physician Assistant

## 2020-05-28 NOTE — Telephone Encounter (Signed)
Please call pt and schedule with Jade.

## 2020-05-28 NOTE — Telephone Encounter (Signed)
Called for peer to peer they would like patient to come in for BNP and EKG and follow up on SOB before echo could be approved. Can we order and schedule?

## 2020-05-28 NOTE — Telephone Encounter (Signed)
Appt scheduled for 05/30/2020.

## 2020-05-29 NOTE — Telephone Encounter (Signed)
Thank you :)

## 2020-05-30 ENCOUNTER — Ambulatory Visit: Payer: BC Managed Care – PPO | Admitting: Physician Assistant

## 2020-05-30 ENCOUNTER — Other Ambulatory Visit: Payer: Self-pay

## 2020-05-30 ENCOUNTER — Encounter: Payer: Self-pay | Admitting: Physician Assistant

## 2020-05-30 VITALS — BP 114/63 | HR 88 | Ht <= 58 in | Wt 128.0 lb

## 2020-05-30 DIAGNOSIS — R0601 Orthopnea: Secondary | ICD-10-CM | POA: Diagnosis not present

## 2020-05-30 DIAGNOSIS — R0602 Shortness of breath: Secondary | ICD-10-CM

## 2020-05-30 DIAGNOSIS — G4733 Obstructive sleep apnea (adult) (pediatric): Secondary | ICD-10-CM

## 2020-05-30 DIAGNOSIS — I2510 Atherosclerotic heart disease of native coronary artery without angina pectoris: Secondary | ICD-10-CM | POA: Diagnosis not present

## 2020-05-30 DIAGNOSIS — Z9989 Dependence on other enabling machines and devices: Secondary | ICD-10-CM

## 2020-05-30 DIAGNOSIS — Z9861 Coronary angioplasty status: Secondary | ICD-10-CM

## 2020-05-30 NOTE — Progress Notes (Signed)
Subjective:    Patient ID: Leslie Roberson, female    DOB: 1955-10-12, 65 y.o.   MRN: [id number]  HPI  Patient is a 64 year old female with CAD, asthma, OSA who presents to the clinic to follow-up on orthopnea.  Insurance company would not pay for echocardiogram to evaluate heart and valve function.  Leslie Roberson continues to have shortness of breath only when Leslie Roberson is laying flat.  Leslie Roberson has a history of CAD with percutaneous coronary angioplasty.  Leslie Roberson denies any lower extremity edema.  Leslie Roberson has not been using her CPAP due to some mask issues and being so uncomfortable.  Leslie Roberson was having these shortness of breath even before Leslie Roberson stopped using the CPAP.  Leslie Roberson is reached out to her CPAP company but Leslie Roberson has not had results in a mass that are comfortable.  The symptoms have been occurring for several months now.  Seems to be better when Leslie Roberson lies on her side.  Leslie Roberson denies any cough, fever, chills, body aches.  Leslie Roberson has some minimal shortness of breath with exertion.  Leslie Roberson is not very active and suspect is due to her physical deconditioning.   .. Active Ambulatory Problems    Diagnosis Date Noted  . GERD (gastroesophageal reflux disease) 11/27/2015  . Osteoporosis 11/27/2015  . GAD (generalized anxiety disorder) 11/27/2015  . MDD (major depressive disorder), recurrent, in full remission ([place]) 11/27/2015  . Chronic dryness of both eyes 11/27/2015  . Acquired hypothyroidism 11/27/2015  . Asthma, chronic 11/27/2015  . Insomnia 11/27/2015  . Seborrheic keratoses 11/27/2015  . Diverticulitis of colon 12/21/2015  . Hip bursitis 12/25/2015  . Biceps tendonitis on right 12/25/2015  . Idiopathic scoliosis 01/22/2016  . Vaginal atrophy 01/31/2016  . Rosacea 02/04/2016  . Right knee pain 02/19/2016  . Paresthesia 04/03/2016  . Facet hypertrophy of lumbosacral region 04/03/2016  . Chronic back pain 04/03/2016  . Bad taste in mouth 07/26/2016  . No energy 09/15/2016  . Lump in throat 01/27/2017  . Dysphagia  01/27/2017  . Chronic tension-type headache, intractable 02/05/2017  . Prediabetes 02/05/2017  . Right leg pain 02/06/2017  . Atypical chest pain 05/24/2017  . Itchy scalp 05/24/2017  . Pneumothorax 06/25/2017  . Angina, class III ([place]) 07/07/2017  . CAD S/P percutaneous coronary angioplasty   . Tongue mass 08/14/2017  . Excessive sweating 09/22/2017  . Rhinorrhea 01/04/2018  . Arthralgia 02/05/2018  . Nasal dryness 02/06/2018  . Hyperlipidemia LDL goal <70 11/29/2018  . DJD (degenerative joint disease), lumbosacral 11/29/2018  . Pre-operative clearance 11/29/2018  . Diabetes mellitus without complication ([place]) 56/25/6389  . Migraine without aura and without status migrainosus, not intractable 02/08/2019  . OAB (overactive bladder) 02/08/2019  . Snoring 05/02/2019  . Non-restorative sleep 05/02/2019  . OSA on CPAP 05/20/2019  . Ingrown right greater toenail 07/19/2019  . Status post hip replacement, left 07/19/2019  . Left hip pain 07/19/2019  . 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  . Acute conjunctivitis of both eyes 01/23/2020  . CAD in native artery 11/27/2015  . S/P cardiac cath 07/07/2017  . Paroxysmal nocturnal dyspnea 05/14/2020  . Orthopnea 05/14/2020  . Cough 05/18/2020   Resolved Ambulatory Problems    Diagnosis Date Noted  . Acute medial meniscus tear 04/23/2016  . Left knee pain 04/28/2016  . Mild intermittent asthma without complication 37/34/2876   Past Medical History:  Diagnosis Date  . Anxiety   . Arthritis   . Coronary artery disease   . Depression   .  History of blood transfusion   . Hypothyroidism   . Pneumonia 2016  . Pre-diabetes   . Thyroid disease       Review of Systems    see HPI.  Objective:   Physical Exam Vitals reviewed.  Constitutional:      Appearance: Leslie Roberson is well-developed.  HENT:     Head: Normocephalic.  Cardiovascular:     Rate and Rhythm: Normal rate and regular rhythm.     Pulses: Normal pulses.      Heart sounds: Normal heart sounds. No murmur heard.   Pulmonary:     Effort: Pulmonary effort is normal.     Breath sounds: Normal breath sounds.  Chest:     Chest wall: No tenderness.  Musculoskeletal:     Right lower leg: No edema.     Left lower leg: No edema.  Neurological:     General: No focal deficit present.     Mental Status: Leslie Roberson is alert and oriented to person, place, and time.  Psychiatric:        Mood and Affect: Mood normal.           Assessment & Plan:  Marland KitchenMarland KitchenJaniyla was seen today for shortness of breath.  Diagnoses and all orders for this visit:  SOB (shortness of breath) on exertion -     Brain natriuretic peptide -     EKG 12-Lead  Orthopnea -     Brain natriuretic peptide  CAD S/P percutaneous coronary angioplasty  OSA on CPAP   Leslie Roberson continues to have symptoms. Insurance declined echo without more information.  6 minute walk test was negative.  EKG- no acute changes. ST elevation and depression noted on previous 2017 EKG resolved. Possible left atrial enlargement.  BNP ordered.   If BNP ordered but patient still having symptoms continue to suggest echo of heart. Leslie Roberson could also consider follow up with cardiologist for evaluation.   I do wish Leslie Roberson would use her CPAP. Leslie Roberson states the mask is too uncomfortable. Leslie Roberson has been fitted with 2 different mask. Per patient Leslie Roberson had SOB symptoms even with cPAP.

## 2020-05-30 NOTE — Patient Instructions (Signed)
Will get BNP and then reorder echo.

## 2020-05-31 LAB — BRAIN NATRIURETIC PEPTIDE: Brain Natriuretic Peptide: 23 pg/mL (ref <100)

## 2020-06-01 ENCOUNTER — Encounter: Payer: Self-pay | Admitting: Physician Assistant

## 2020-06-01 NOTE — Progress Notes (Signed)
Normal BNP and normal EKG. Pt continues to c/o SOB laying flat and at night. I ordered echo and this is what peer to peer told me to do. Do you think I just reorder echo to try to get it approved again and see if goes through or shoots back another peer to peer?

## 2020-06-04 DIAGNOSIS — N3281 Overactive bladder: Secondary | ICD-10-CM | POA: Diagnosis not present

## 2020-06-05 ENCOUNTER — Encounter: Payer: Self-pay | Admitting: Physician Assistant

## 2020-06-11 DIAGNOSIS — M47816 Spondylosis without myelopathy or radiculopathy, lumbar region: Secondary | ICD-10-CM | POA: Diagnosis not present

## 2020-06-25 NOTE — Progress Notes (Signed)
HPI: FU CAD. I initially saw patient in March 2019 with chest pain. We scheduled a cardiac catheterization but patient subsequently presented with pneumothorax.She ultimately underwent cardiac catheterization April 2019 and was found to have a 99% mid to distal right coronary artery, 75% proximal LAD followed by 60% lesion and normal LV function. The patient had PCI of both lesions with drug-eluting stents. Since last seen, she has some dyspnea on exertion.  She also notices orthopnea.  No PND or pedal edema.  She denies chest pain or syncope.  Current Outpatient Medications  Medication Sig Dispense Refill  . albuterol (PROVENTIL HFA;VENTOLIN HFA) 108 (90 Base) MCG/ACT inhaler Inhale 1-2 puffs into the lungs every 4 (four) hours as needed for wheezing or shortness of breath. 1 Inhaler 3  . aspirin EC 81 MG tablet Take 81 mg by mouth at bedtime.    Marland Kitchen atorvastatin (LIPITOR) 20 MG tablet Take 1 tablet (20 mg total) by mouth daily. 90 tablet 3  . busPIRone (BUSPAR) 15 MG tablet Take 1 tablet (15 mg total) by mouth daily. 30 tablet 0  . calcium-vitamin D (OSCAL WITH D) 500-200 MG-UNIT tablet Take 1 tablet by mouth daily.    . clonazePAM (KLONOPIN) 0.5 MG tablet Take 0.25 mg by mouth at bedtime.     Marland Kitchen denosumab (PROLIA) 60 MG/ML SOLN injection Inject 60 mg into the skin every 6 (six) months. Administer in upper arm, thigh, or abdomen    . desvenlafaxine (PRISTIQ) 100 MG 24 hr tablet Take 100 mg by mouth at bedtime 30 tablet 0  . gabapentin (NEURONTIN) 300 MG capsule TAKE 1 CAPSULE BY MOUTH IN THE MORNING AND 2 CAPSULES AT BEDTIME 270 capsule 3  . levothyroxine (SYNTHROID) 137 MCG tablet TAKE 1/2 TABLET DAILY 5 DAYS A WEEK AND 1 TABLET DAILY 2 DAYS A WEEK. 180 tablet 3  . lidocaine (LIDODERM) 5 % Place 1 patch onto the skin daily. Remove & Discard patch within 12 hours or as directed by MD 30 patch 12  . Menthol, Topical Analgesic, (MINERAL ICE EX) Apply 1 application to affected sites one to  two times a day as needed (for pain)    . metFORMIN (GLUCOPHAGE-XR) 500 MG 24 hr tablet TAKE 1 TABLET BY MOUTH EVERY DAY WITH BREAKFAST 90 tablet 1  . metoprolol succinate (TOPROL-XL) 25 MG 24 hr tablet TAKE 1 TABLET (25 MG TOTAL) BY MOUTH DAILY. PLEASE KEEP UPCOMING APPT FOR REFILLS 90 tablet 3  . MYRBETRIQ 50 MG TB24 tablet TAKE 1 TABLET BY MOUTH EVERY DAY 90 tablet 3  . nitroGLYCERIN (NITROSTAT) 0.4 MG SL tablet PLACE 1 TABLET (0.4 MG TOTAL) UNDER THE TONGUE EVERY 5 (FIVE) MINUTES AS NEEDED. 25 tablet 2  . Omega-3 Fatty Acids (FISH OIL PO) Take 1 capsule by mouth daily.     . pantoprazole (PROTONIX) 40 MG tablet TAKE 1 TABLET BY MOUTH EVERY DAY 90 tablet 3  . REXULTI 1 MG TABS Take 1 tablet by mouth daily.     Marland Kitchen tolterodine (DETROL LA) 2 MG 24 hr capsule Take 2 mg by mouth daily.    . traZODone (DESYREL) 150 MG tablet Take 150 mg by mouth at bedtime.    . diclofenac sodium (VOLTAREN) 1 % GEL APPLY 4G TOPICALLY 4 TIMES DAILY (Patient not taking: Reported on 06/28/2020) 100 g 1   No current facility-administered medications for this visit.     Past Medical History:  Diagnosis Date  . Anxiety   . Arthritis    "  right little finger; base of thumb left hand; spine" (07/07/2017)  . Asthma, chronic 11/27/2015  . Chronic back pain   . Coronary artery disease    4/19 PCI/DEx2 to LAD, and PCI/DES x1 to RCA, normal EF  . Depression   . Diverticulitis of colon 12/21/2015   Colonoscopy every 5 years/digestive health.   . GERD (gastroesophageal reflux disease) 11/27/2015  . History of blood transfusion    "related to spinal fusions"  . Hypothyroidism   . Idiopathic scoliosis 01/22/2016  . Osteoporosis 11/27/2015   On prolia.   . Pneumonia 2016  . Pneumothorax 06/26/2017  . Pre-diabetes   . Thyroid disease     Past Surgical History:  Procedure Laterality Date  . ABDOMINAL HYSTERECTOMY    . bladder mesh     S/P bladder sling"  . CORONARY STENT INTERVENTION N/A 07/07/2017   Procedure: CORONARY  STENT INTERVENTION;  Surgeon: Leonie Man, MD;  Location: [facility];  Service: Cardiovascular;  Laterality: N/A;  . ELBOW SURGERY Right    "dislocation"  . INCONTINENCE SURGERY     "didn't work"  . INTRAVASCULAR PRESSURE WIRE/FFR STUDY N/A 07/07/2017   Procedure: INTRAVASCULAR PRESSURE WIRE/FFR STUDY;  Surgeon: Leonie Man, MD;  Location: [facility];  Service: Cardiovascular;  Laterality: N/A;  . JOINT REPLACEMENT    . LEFT HEART CATH AND CORONARY ANGIOGRAPHY N/A 07/07/2017   Procedure: LEFT HEART CATH AND CORONARY ANGIOGRAPHY;  Surgeon: Leonie Man, MD;  Location: [facility];  Service: Cardiovascular;  Laterality: N/A;  . SPINAL FUSION  ~ 1970 X 3   "below neck - lower back"  . TONSILLECTOMY    . TOTAL HIP ARTHROPLASTY Left     Social History   Socioeconomic History  . Marital status: Married    Spouse name: Not on file  . Number of children: 1  . Years of education: Not on file  . Highest education level: Not on file  Occupational History  . Not on file  Tobacco Use  . Smoking status: Never Smoker  . Smokeless tobacco: Never Used  Vaping Use  . Vaping Use: Never used  Substance and Sexual Activity  . Alcohol use: Yes    Comment: 07/07/2017 "5-6 times/year; 1 drink at each occasion"  . Drug use: No  . Sexual activity: Yes  Other Topics Concern  . Not on file  Social History Narrative  . Not on file   Social Determinants of Health   Financial Resource Strain: Not on file  Food Insecurity: Not on file  Transportation Needs: Not on file  Physical Activity: Not on file  Stress: Not on file  Social Connections: Not on file  Intimate Partner Violence: Not on file    Family History  Problem Relation Age of Onset  . Cancer Mother        lung  . Depression Mother   . Heart attack Father   . Hyperlipidemia Father   . Hypertension Father   . Diabetes Maternal Aunt   . Hyperlipidemia Paternal Aunt   . COPD Paternal Aunt     ROS:  no fevers or chills, productive cough, hemoptysis, dysphasia, odynophagia, melena, hematochezia, dysuria, hematuria, rash, seizure activity, orthopnea, PND, pedal edema, claudication. Remaining systems are negative.  Physical Exam: Well-developed well-nourished in no acute distress.  Skin is warm and dry.  HEENT is normal.  Neck is supple.  Chest is clear to auscultation with normal expansion.  Cardiovascular exam is regular  rate and rhythm.  Abdominal exam nontender or distended. No masses palpated. Extremities show no edema. Back scoliosis noted neuro grossly intact   A/P  1 coronary artery disease-patient denies chest pain.  Continue medical therapy with aspirin and statin.  2 dyspnea-she notes some dyspnea on exertion and occasional orthopnea.  She does not appear to be significantly volume overloaded.  We will arrange echocardiogram to reassess LV function.  3 hyperlipidemia-continue statin.  Kirk Ruths, MD

## 2020-06-28 ENCOUNTER — Encounter: Payer: Self-pay | Admitting: Cardiology

## 2020-06-28 ENCOUNTER — Ambulatory Visit (INDEPENDENT_AMBULATORY_CARE_PROVIDER_SITE_OTHER): Payer: BC Managed Care – PPO | Admitting: Cardiology

## 2020-06-28 ENCOUNTER — Other Ambulatory Visit: Payer: Self-pay

## 2020-06-28 VITALS — BP 90/50 | HR 88 | Ht <= 58 in | Wt 128.4 lb

## 2020-06-28 DIAGNOSIS — E785 Hyperlipidemia, unspecified: Secondary | ICD-10-CM | POA: Diagnosis not present

## 2020-06-28 DIAGNOSIS — R0602 Shortness of breath: Secondary | ICD-10-CM | POA: Diagnosis not present

## 2020-06-28 DIAGNOSIS — Z9861 Coronary angioplasty status: Secondary | ICD-10-CM

## 2020-06-28 DIAGNOSIS — I2510 Atherosclerotic heart disease of native coronary artery without angina pectoris: Secondary | ICD-10-CM | POA: Diagnosis not present

## 2020-06-28 NOTE — Patient Instructions (Signed)

## 2020-07-11 DIAGNOSIS — M5136 Other intervertebral disc degeneration, lumbar region: Secondary | ICD-10-CM | POA: Diagnosis not present

## 2020-07-11 DIAGNOSIS — M5416 Radiculopathy, lumbar region: Secondary | ICD-10-CM | POA: Diagnosis not present

## 2020-07-11 DIAGNOSIS — Z6824 Body mass index (BMI) 24.0-24.9, adult: Secondary | ICD-10-CM | POA: Diagnosis not present

## 2020-07-11 DIAGNOSIS — M47816 Spondylosis without myelopathy or radiculopathy, lumbar region: Secondary | ICD-10-CM | POA: Diagnosis not present

## 2020-07-25 DIAGNOSIS — N3281 Overactive bladder: Secondary | ICD-10-CM | POA: Diagnosis not present

## 2020-08-01 ENCOUNTER — Other Ambulatory Visit: Payer: Self-pay

## 2020-08-01 ENCOUNTER — Ambulatory Visit (HOSPITAL_COMMUNITY): Payer: BC Managed Care – PPO | Attending: Cardiology

## 2020-08-01 DIAGNOSIS — R0602 Shortness of breath: Secondary | ICD-10-CM | POA: Diagnosis not present

## 2020-08-01 LAB — ECHOCARDIOGRAM COMPLETE
Area-P 1/2: 2.91 cm2
S' Lateral: 2.3 cm

## 2020-08-08 DIAGNOSIS — M47816 Spondylosis without myelopathy or radiculopathy, lumbar region: Secondary | ICD-10-CM | POA: Diagnosis not present

## 2020-08-08 DIAGNOSIS — M5136 Other intervertebral disc degeneration, lumbar region: Secondary | ICD-10-CM | POA: Diagnosis not present

## 2020-08-08 DIAGNOSIS — Z79891 Long term (current) use of opiate analgesic: Secondary | ICD-10-CM | POA: Diagnosis not present

## 2020-08-08 DIAGNOSIS — Z79899 Other long term (current) drug therapy: Secondary | ICD-10-CM | POA: Diagnosis not present

## 2020-08-08 DIAGNOSIS — G894 Chronic pain syndrome: Secondary | ICD-10-CM | POA: Diagnosis not present

## 2020-08-08 DIAGNOSIS — M5416 Radiculopathy, lumbar region: Secondary | ICD-10-CM | POA: Diagnosis not present

## 2020-08-10 ENCOUNTER — Encounter: Payer: Self-pay | Admitting: Physician Assistant

## 2020-08-13 ENCOUNTER — Other Ambulatory Visit: Payer: Self-pay | Admitting: Physician Assistant

## 2020-08-13 DIAGNOSIS — K2100 Gastro-esophageal reflux disease with esophagitis, without bleeding: Secondary | ICD-10-CM

## 2020-08-13 DIAGNOSIS — F333 Major depressive disorder, recurrent, severe with psychotic symptoms: Secondary | ICD-10-CM | POA: Diagnosis not present

## 2020-09-04 DIAGNOSIS — M461 Sacroiliitis, not elsewhere classified: Secondary | ICD-10-CM | POA: Diagnosis not present

## 2020-09-04 DIAGNOSIS — M7061 Trochanteric bursitis, right hip: Secondary | ICD-10-CM | POA: Diagnosis not present

## 2020-09-04 DIAGNOSIS — M5416 Radiculopathy, lumbar region: Secondary | ICD-10-CM | POA: Diagnosis not present

## 2020-09-04 DIAGNOSIS — M47816 Spondylosis without myelopathy or radiculopathy, lumbar region: Secondary | ICD-10-CM | POA: Diagnosis not present

## 2020-09-04 DIAGNOSIS — M5136 Other intervertebral disc degeneration, lumbar region: Secondary | ICD-10-CM | POA: Diagnosis not present

## 2020-09-25 DIAGNOSIS — M461 Sacroiliitis, not elsewhere classified: Secondary | ICD-10-CM | POA: Diagnosis not present

## 2020-10-08 DIAGNOSIS — S022XXB Fracture of nasal bones, initial encounter for open fracture: Secondary | ICD-10-CM | POA: Diagnosis not present

## 2020-10-08 DIAGNOSIS — S80212A Abrasion, left knee, initial encounter: Secondary | ICD-10-CM | POA: Diagnosis not present

## 2020-10-08 DIAGNOSIS — Z888 Allergy status to other drugs, medicaments and biological substances status: Secondary | ICD-10-CM | POA: Diagnosis not present

## 2020-10-08 DIAGNOSIS — F419 Anxiety disorder, unspecified: Secondary | ICD-10-CM | POA: Diagnosis not present

## 2020-10-08 DIAGNOSIS — Z885 Allergy status to narcotic agent status: Secondary | ICD-10-CM | POA: Diagnosis not present

## 2020-10-08 DIAGNOSIS — I219 Acute myocardial infarction, unspecified: Secondary | ICD-10-CM | POA: Diagnosis not present

## 2020-10-08 DIAGNOSIS — S60511A Abrasion of right hand, initial encounter: Secondary | ICD-10-CM | POA: Diagnosis not present

## 2020-10-08 DIAGNOSIS — R040 Epistaxis: Secondary | ICD-10-CM | POA: Diagnosis not present

## 2020-10-08 DIAGNOSIS — S60512A Abrasion of left hand, initial encounter: Secondary | ICD-10-CM | POA: Diagnosis not present

## 2020-10-08 DIAGNOSIS — Z7982 Long term (current) use of aspirin: Secondary | ICD-10-CM | POA: Diagnosis not present

## 2020-10-08 DIAGNOSIS — S0121XA Laceration without foreign body of nose, initial encounter: Secondary | ICD-10-CM | POA: Diagnosis not present

## 2020-10-08 DIAGNOSIS — S80211A Abrasion, right knee, initial encounter: Secondary | ICD-10-CM | POA: Diagnosis not present

## 2020-10-08 DIAGNOSIS — K219 Gastro-esophageal reflux disease without esophagitis: Secondary | ICD-10-CM | POA: Diagnosis not present

## 2020-10-08 DIAGNOSIS — Z955 Presence of coronary angioplasty implant and graft: Secondary | ICD-10-CM | POA: Diagnosis not present

## 2020-10-08 DIAGNOSIS — S8992XA Unspecified injury of left lower leg, initial encounter: Secondary | ICD-10-CM | POA: Diagnosis not present

## 2020-10-08 DIAGNOSIS — S0990XA Unspecified injury of head, initial encounter: Secondary | ICD-10-CM | POA: Diagnosis not present

## 2020-10-08 DIAGNOSIS — S8991XA Unspecified injury of right lower leg, initial encounter: Secondary | ICD-10-CM | POA: Diagnosis not present

## 2020-10-08 DIAGNOSIS — S6992XA Unspecified injury of left wrist, hand and finger(s), initial encounter: Secondary | ICD-10-CM | POA: Diagnosis not present

## 2020-10-08 DIAGNOSIS — F32A Depression, unspecified: Secondary | ICD-10-CM | POA: Diagnosis not present

## 2020-10-08 DIAGNOSIS — S0081XA Abrasion of other part of head, initial encounter: Secondary | ICD-10-CM | POA: Diagnosis not present

## 2020-10-08 DIAGNOSIS — Z91048 Other nonmedicinal substance allergy status: Secondary | ICD-10-CM | POA: Diagnosis not present

## 2020-10-08 DIAGNOSIS — S0993XA Unspecified injury of face, initial encounter: Secondary | ICD-10-CM | POA: Diagnosis not present

## 2020-10-08 DIAGNOSIS — S022XXA Fracture of nasal bones, initial encounter for closed fracture: Secondary | ICD-10-CM | POA: Diagnosis not present

## 2020-10-08 DIAGNOSIS — S6991XA Unspecified injury of right wrist, hand and finger(s), initial encounter: Secondary | ICD-10-CM | POA: Diagnosis not present

## 2020-10-12 DIAGNOSIS — S022XXA Fracture of nasal bones, initial encounter for closed fracture: Secondary | ICD-10-CM | POA: Diagnosis not present

## 2020-10-15 ENCOUNTER — Ambulatory Visit: Payer: BC Managed Care – PPO | Admitting: Physician Assistant

## 2020-11-05 DIAGNOSIS — M461 Sacroiliitis, not elsewhere classified: Secondary | ICD-10-CM | POA: Diagnosis not present

## 2020-11-05 DIAGNOSIS — M5416 Radiculopathy, lumbar region: Secondary | ICD-10-CM | POA: Diagnosis not present

## 2020-11-12 ENCOUNTER — Ambulatory Visit: Payer: BC Managed Care – PPO | Admitting: Physician Assistant

## 2020-11-12 DIAGNOSIS — F333 Major depressive disorder, recurrent, severe with psychotic symptoms: Secondary | ICD-10-CM | POA: Diagnosis not present

## 2020-11-14 ENCOUNTER — Ambulatory Visit (INDEPENDENT_AMBULATORY_CARE_PROVIDER_SITE_OTHER): Payer: BC Managed Care – PPO | Admitting: Physician Assistant

## 2020-11-14 ENCOUNTER — Encounter: Payer: Self-pay | Admitting: Physician Assistant

## 2020-11-14 VITALS — BP 120/67 | HR 78 | Ht <= 58 in | Wt 127.0 lb

## 2020-11-14 DIAGNOSIS — M810 Age-related osteoporosis without current pathological fracture: Secondary | ICD-10-CM

## 2020-11-14 DIAGNOSIS — G4733 Obstructive sleep apnea (adult) (pediatric): Secondary | ICD-10-CM

## 2020-11-14 DIAGNOSIS — Z9989 Dependence on other enabling machines and devices: Secondary | ICD-10-CM

## 2020-11-14 DIAGNOSIS — Z23 Encounter for immunization: Secondary | ICD-10-CM

## 2020-11-14 DIAGNOSIS — Z79899 Other long term (current) drug therapy: Secondary | ICD-10-CM | POA: Diagnosis not present

## 2020-11-14 DIAGNOSIS — F3342 Major depressive disorder, recurrent, in full remission: Secondary | ICD-10-CM

## 2020-11-14 DIAGNOSIS — E118 Type 2 diabetes mellitus with unspecified complications: Secondary | ICD-10-CM | POA: Diagnosis not present

## 2020-11-14 DIAGNOSIS — W19XXXD Unspecified fall, subsequent encounter: Secondary | ICD-10-CM

## 2020-11-14 DIAGNOSIS — M79641 Pain in right hand: Secondary | ICD-10-CM

## 2020-11-14 LAB — POCT GLYCOSYLATED HEMOGLOBIN (HGB A1C): Hemoglobin A1C: 6.3 % — AB (ref 4.0–5.6)

## 2020-11-14 LAB — POCT UA - MICROALBUMIN
Albumin/Creatinine Ratio, Urine, POC: <30
Creatinine, POC: 200 mg/dL
Microalbumin Ur, POC: 30 mg/L

## 2020-11-14 NOTE — Progress Notes (Signed)
Subjective:    Patient ID: Leslie Roberson, female    DOB: 12/01/1955, 65 y.o.   MRN: [id number]  HPI Pt is a 65 yo female with CAD, OSA, Migraines, osteoporosis, hypothyroidism, MDD who presents to the clinic for medication follow up.   Not checking sugars. No hypoglycemic events. Walking and staying active. No open sores or wounds. Taking metformin daily.   Mood is controlled. No SI/HC.   No CP, palpitations, headaches, vision changes.   Needs prolia.   She did fall 7/11 while she was walking the dog. Fell on her right side and hand. Went to ED. Xrays showed no fracture. Her right hand continues to hurt and feel stiff.  .. Active Ambulatory Problems    Diagnosis Date Noted   GERD (gastroesophageal reflux disease) 11/27/2015   Osteoporosis 11/27/2015   GAD (generalized anxiety disorder) 11/27/2015   MDD (major depressive disorder), recurrent, in full remission ([place]) 11/27/2015   Chronic dryness of both eyes 11/27/2015   Acquired hypothyroidism 11/27/2015   Asthma, chronic 11/27/2015   Insomnia 11/27/2015   Seborrheic keratoses 11/27/2015   Diverticulitis of colon 12/21/2015   Hip bursitis 12/25/2015   Biceps tendonitis on right 12/25/2015   Idiopathic scoliosis 01/22/2016   Vaginal atrophy 01/31/2016   Rosacea 02/04/2016   Right knee pain 02/19/2016   Paresthesia 04/03/2016   Facet hypertrophy of lumbosacral region 04/03/2016   Chronic back pain 04/03/2016   Bad taste in mouth 07/26/2016   No energy 09/15/2016   Lump in throat 01/27/2017   Dysphagia 01/27/2017   Chronic tension-type headache, intractable 02/05/2017   Prediabetes 02/05/2017   Right leg pain 02/06/2017   Atypical chest pain 05/24/2017   Itchy scalp 05/24/2017   Pneumothorax 06/25/2017   Angina, class III ([place]) 07/07/2017   CAD S/P percutaneous coronary angioplasty    Tongue mass 08/14/2017   Excessive sweating 09/22/2017   Rhinorrhea 01/04/2018   Arthralgia 02/05/2018   Nasal dryness  02/06/2018   Hyperlipidemia LDL goal <70 11/29/2018   DJD (degenerative joint disease), lumbosacral 11/29/2018   Pre-operative clearance 11/29/2018   Diabetes mellitus without complication ([place]) 28/78/6767   Migraine without aura and without status migrainosus, not intractable 02/08/2019   OAB (overactive bladder) 02/08/2019   Snoring 05/02/2019   Non-restorative sleep 05/02/2019   OSA on CPAP 05/20/2019   Ingrown right greater toenail 07/19/2019   Status post hip replacement, left 07/19/2019   Left hip pain 07/19/2019   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   Acute conjunctivitis of both eyes 01/23/2020   CAD in native artery 11/27/2015   S/P cardiac cath 07/07/2017   Paroxysmal nocturnal dyspnea 05/14/2020   Orthopnea 05/14/2020   Cough 05/18/2020   Controlled type 2 diabetes mellitus with complication, without long-term current use of insulin ([place]) 11/16/2020   Right hand pain 11/16/2020   Fall 11/16/2020   Resolved Ambulatory Problems    Diagnosis Date Noted   Acute medial meniscus tear 04/23/2016   Left knee pain 04/28/2016   Mild intermittent asthma without complication 20/94/7096   Past Medical History:  Diagnosis Date   Anxiety    Arthritis    Coronary artery disease    Depression    History of blood transfusion    Hypothyroidism    Pneumonia 2016   Pre-diabetes    Thyroid disease       Review of Systems See HPI.     Objective:   Physical Exam Vitals reviewed.  Constitutional:  Appearance: Normal appearance.  HENT:     Head: Normocephalic.  Neck:     Vascular: No carotid bruit.  Cardiovascular:     Rate and Rhythm: Normal rate and regular rhythm.     Pulses: Normal pulses.  Pulmonary:     Effort: Pulmonary effort is normal.     Breath sounds: Normal breath sounds.  Musculoskeletal:     Comments: Right hand: Normal grip and flexion/extension at right wrist.  No swelling, warmth or redness.  No pain to palpation over hand.    Neurological:     General: No focal deficit present.     Mental Status: She is alert and oriented to person, place, and time.  Psychiatric:        Mood and Affect: Mood normal.      .. Results for orders placed or performed in visit on 11/14/20  COMPLETE METABOLIC PANEL WITH GFR  Result Value Ref Range   Glucose, Bld 75 65 - 99 mg/dL   BUN 17 7 - 25 mg/dL   Creat 0.95 0.50 - 1.05 mg/dL   eGFR 66 > OR = 60 mL/min/1.6m   BUN/Creatinine Ratio NOT APPLICABLE 6 - 22 (calc)   Sodium 140 135 - 146 mmol/L   Potassium 4.2 3.5 - 5.3 mmol/L   Chloride 102 98 - 110 mmol/L   CO2 30 20 - 32 mmol/L   Calcium 10.2 8.6 - 10.4 mg/dL   Total Protein 6.7 6.1 - 8.1 g/dL   Albumin 4.2 3.6 - 5.1 g/dL   Globulin 2.5 1.9 - 3.7 g/dL (calc)   AG Ratio 1.7 1.0 - 2.5 (calc)   Total Bilirubin 0.3 0.2 - 1.2 mg/dL   Alkaline phosphatase (APISO) 58 37 - 153 U/L   AST 18 10 - 35 U/L   ALT 25 6 - 29 U/L  POCT glycosylated hemoglobin (Hb A1C)  Result Value Ref Range   Hemoglobin A1C 6.3 (A) 4.0 - 5.6 %   HbA1c POC (<> result, manual entry)     HbA1c, POC (prediabetic range)     HbA1c, POC (controlled diabetic range)    POCT UA - Microalbumin  Result Value Ref Range   Microalbumin Ur, POC 30 mg/L   Creatinine, POC 200 mg/dL   Albumin/Creatinine Ratio, Urine, POC <30        Assessment & Plan:  .Marland KitchenMarland KitchenAmyewas seen today for follow-up.  Diagnoses and all orders for this visit:  Controlled type 2 diabetes mellitus with complication, without long-term current use of insulin (HCC) -     POCT glycosylated hemoglobin (Hb A1C) -     POCT UA - Microalbumin  Need for pneumococcal vaccination -     Pneumococcal conjugate vaccine 20-valent (Prevnar 20)  Medication management -     COMPLETE METABOLIC PANEL WITH GFR  MDD (major depressive disorder), recurrent, in full remission ([place]  Age-related osteoporosis without current pathological fracture  OSA on CPAP  Fall, subsequent encounter  Right hand  pain   A1C to goal.  Continue on same medications.  BP to goal.  On STATIN.  Microalbumin normal.  Foot and eye exam UTD.  Covid vaccine x4. Needs flu shot.  Pneumonia vaccines UTD. 20 given today.   Cmp ordered to schedule prolia.   Discussed icing and voltaren gel for hand. Give another week or so to heal. PE good today. Likely still pain from inflammation and contusion.

## 2020-11-15 ENCOUNTER — Ambulatory Visit (INDEPENDENT_AMBULATORY_CARE_PROVIDER_SITE_OTHER): Payer: BC Managed Care – PPO | Admitting: Family Medicine

## 2020-11-15 ENCOUNTER — Encounter: Payer: Self-pay | Admitting: Family Medicine

## 2020-11-15 VITALS — BP 111/66 | HR 64

## 2020-11-15 DIAGNOSIS — M810 Age-related osteoporosis without current pathological fracture: Secondary | ICD-10-CM | POA: Diagnosis not present

## 2020-11-15 LAB — COMPLETE METABOLIC PANEL WITH GFR
AG Ratio: 1.7 (calc) (ref 1.0–2.5)
ALT: 25 U/L (ref 6–29)
AST: 18 U/L (ref 10–35)
Albumin: 4.2 g/dL (ref 3.6–5.1)
Alkaline phosphatase (APISO): 58 U/L (ref 37–153)
BUN: 17 mg/dL (ref 7–25)
CO2: 30 mmol/L (ref 20–32)
Calcium: 10.2 mg/dL (ref 8.6–10.4)
Chloride: 102 mmol/L (ref 98–110)
Creat: 0.95 mg/dL (ref 0.50–1.05)
Globulin: 2.5 g/dL (calc) (ref 1.9–3.7)
Glucose, Bld: 75 mg/dL (ref 65–99)
Potassium: 4.2 mmol/L (ref 3.5–5.3)
Sodium: 140 mmol/L (ref 135–146)
Total Bilirubin: 0.3 mg/dL (ref 0.2–1.2)
Total Protein: 6.7 g/dL (ref 6.1–8.1)
eGFR: 66 mL/min/{1.73_m2} (ref >=60)

## 2020-11-15 MED ORDER — DENOSUMAB 60 MG/ML ~~LOC~~ SOSY
60.0000 mg | PREFILLED_SYRINGE | Freq: Once | SUBCUTANEOUS | Status: AC
  Administered 2020-11-15: 60 mg via SUBCUTANEOUS

## 2020-11-15 NOTE — Progress Notes (Signed)
Prolia given in right arm without any complications.  Pt to return in 6 months for next injection.

## 2020-11-15 NOTE — Progress Notes (Signed)
Ok to get set up for prolia.

## 2020-11-15 NOTE — Progress Notes (Signed)
Agree with documentation as above.   Kaedon Fanelli, MD  

## 2020-11-16 ENCOUNTER — Encounter: Payer: Self-pay | Admitting: Physician Assistant

## 2020-11-16 DIAGNOSIS — E118 Type 2 diabetes mellitus with unspecified complications: Secondary | ICD-10-CM | POA: Insufficient documentation

## 2020-11-16 DIAGNOSIS — W19XXXA Unspecified fall, initial encounter: Secondary | ICD-10-CM | POA: Insufficient documentation

## 2020-11-16 DIAGNOSIS — M79641 Pain in right hand: Secondary | ICD-10-CM | POA: Insufficient documentation

## 2020-12-02 ENCOUNTER — Other Ambulatory Visit: Payer: Self-pay | Admitting: Physician Assistant

## 2020-12-02 DIAGNOSIS — E118 Type 2 diabetes mellitus with unspecified complications: Secondary | ICD-10-CM

## 2020-12-16 ENCOUNTER — Other Ambulatory Visit: Payer: Self-pay | Admitting: Physician Assistant

## 2020-12-16 DIAGNOSIS — K2100 Gastro-esophageal reflux disease with esophagitis, without bleeding: Secondary | ICD-10-CM

## 2021-01-02 ENCOUNTER — Other Ambulatory Visit: Payer: Self-pay | Admitting: Physician Assistant

## 2021-01-02 DIAGNOSIS — N3281 Overactive bladder: Secondary | ICD-10-CM

## 2021-01-07 DIAGNOSIS — M461 Sacroiliitis, not elsewhere classified: Secondary | ICD-10-CM | POA: Diagnosis not present

## 2021-01-11 DIAGNOSIS — Z1211 Encounter for screening for malignant neoplasm of colon: Secondary | ICD-10-CM | POA: Diagnosis not present

## 2021-01-11 DIAGNOSIS — Z86010 Personal history of colonic polyps: Secondary | ICD-10-CM | POA: Diagnosis not present

## 2021-01-11 DIAGNOSIS — K5730 Diverticulosis of large intestine without perforation or abscess without bleeding: Secondary | ICD-10-CM | POA: Diagnosis not present

## 2021-01-11 DIAGNOSIS — Z09 Encounter for follow-up examination after completed treatment for conditions other than malignant neoplasm: Secondary | ICD-10-CM | POA: Diagnosis not present

## 2021-01-21 ENCOUNTER — Ambulatory Visit (INDEPENDENT_AMBULATORY_CARE_PROVIDER_SITE_OTHER): Payer: BC Managed Care – PPO | Admitting: Orthopaedic Surgery

## 2021-01-21 ENCOUNTER — Encounter: Payer: Self-pay | Admitting: Orthopaedic Surgery

## 2021-01-21 ENCOUNTER — Ambulatory Visit: Payer: Self-pay

## 2021-01-21 DIAGNOSIS — Z96642 Presence of left artificial hip joint: Secondary | ICD-10-CM

## 2021-01-21 NOTE — Progress Notes (Signed)
The patient is someone I am seeing for her left and right hips.  Her left hip had a right hip replacement many years ago and then 13 years ago had a acetabular and hip ball liner exchange due to wear.  This was done elsewhere.  She has known significant spine issues and is still not pleased with her mobility in general as it relates to mainly her spine but she does get pain in the gluteal area of both hips.  Her left total hip moves smoothly and fluidly in the right hip also moves smoothly and fluidly.  She does have a lot of pain in the posterior hip bilaterally.  An AP pelvis compared to a year ago still shows some slight eccentric wear of the acetabular component but is not changed in a year.  There is no evidence of osteolysis around the metal components.  Her right hip joint space is still well-maintained.  At this point a MRI is warranted of the pelvis so we can assess both hips with looking at the cartilage on the right hip and looking for any fluid collections in the left hip there would be worrisome for osteolysis and considering a polyliner exchange and hip block change in the left side.  Her husband did give me the operative report from 2009 so we do have the manufacture of her implants if we do need to consider revision surgery.  We will see her back once we have the MRI of her pelvis.

## 2021-01-22 ENCOUNTER — Other Ambulatory Visit: Payer: Self-pay

## 2021-01-22 DIAGNOSIS — M25552 Pain in left hip: Secondary | ICD-10-CM

## 2021-01-22 DIAGNOSIS — Z96642 Presence of left artificial hip joint: Secondary | ICD-10-CM

## 2021-01-27 ENCOUNTER — Other Ambulatory Visit: Payer: Self-pay

## 2021-01-27 ENCOUNTER — Ambulatory Visit (INDEPENDENT_AMBULATORY_CARE_PROVIDER_SITE_OTHER): Payer: BC Managed Care – PPO

## 2021-01-27 DIAGNOSIS — Z96642 Presence of left artificial hip joint: Secondary | ICD-10-CM | POA: Diagnosis not present

## 2021-01-27 DIAGNOSIS — M25551 Pain in right hip: Secondary | ICD-10-CM

## 2021-01-27 DIAGNOSIS — K5730 Diverticulosis of large intestine without perforation or abscess without bleeding: Secondary | ICD-10-CM | POA: Diagnosis not present

## 2021-01-27 DIAGNOSIS — M7062 Trochanteric bursitis, left hip: Secondary | ICD-10-CM | POA: Diagnosis not present

## 2021-01-27 DIAGNOSIS — M47816 Spondylosis without myelopathy or radiculopathy, lumbar region: Secondary | ICD-10-CM | POA: Diagnosis not present

## 2021-01-27 DIAGNOSIS — M25552 Pain in left hip: Secondary | ICD-10-CM | POA: Diagnosis not present

## 2021-01-27 IMAGING — MR MR PELVIS W/O CM
4 of 6 series · 21 of 48 positions shown · non-contrast
Comparison: 01/21/2021 radiographs

CLINICAL DATA: Bilateral hip pain, right greater than left over the
last 6 months. Left total hip replacement

EXAM:
MRI PELVIS WITHOUT CONTRAST
TECHNIQUE: Multiplanar multisequence MR imaging of the pelvis was performed. No
intravenous contrast was administered.

[Series 4: T1 · coronal · 5.0mm · 0.70mm/px · 5 of 30 slices shown (1 of 2)]
[im 1/30]
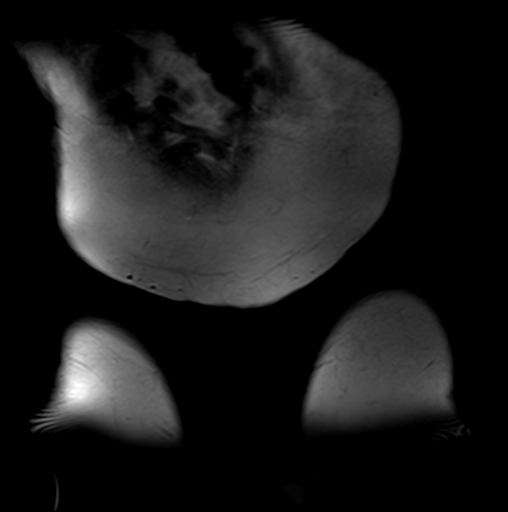
[im 8/30]
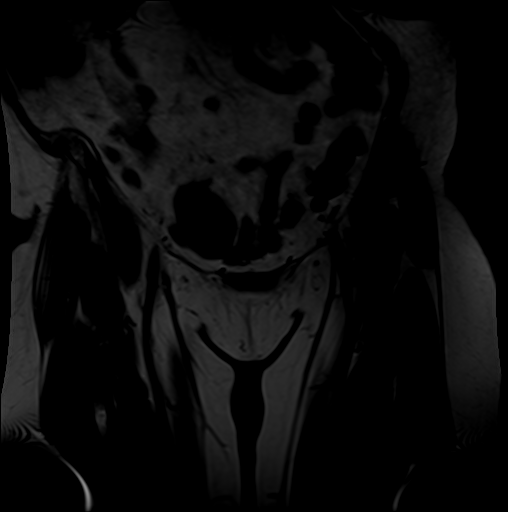
[im 15/30]
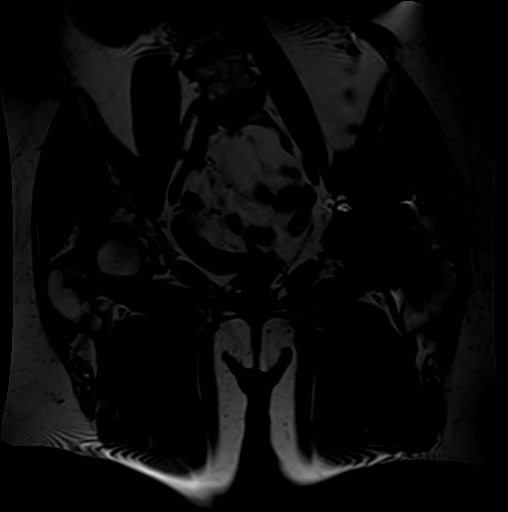
[im 22/30]
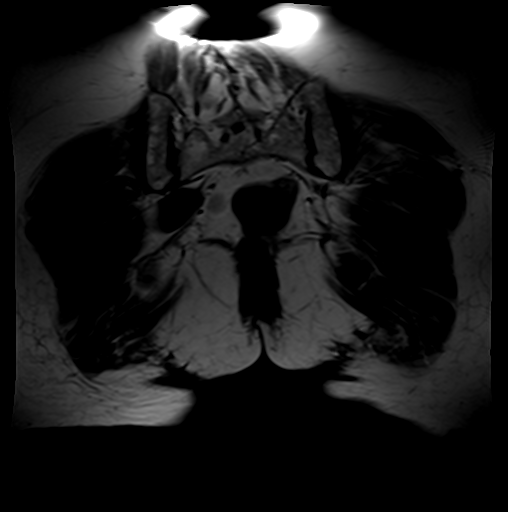
[im 30/30]
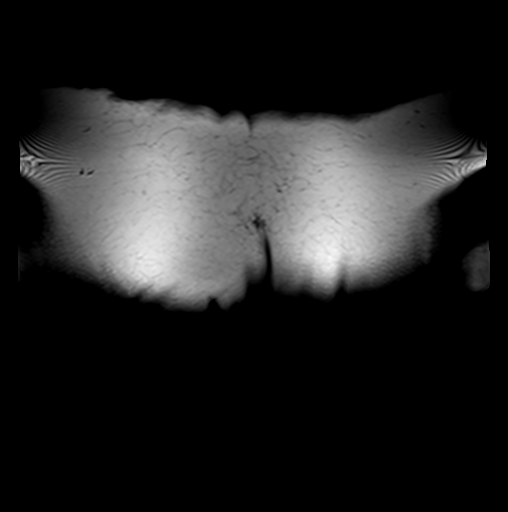

[Series 5: STIR · coronal · 5.0mm · 0.70mm/px · 5 of 30 slices shown]
[im 1/30]
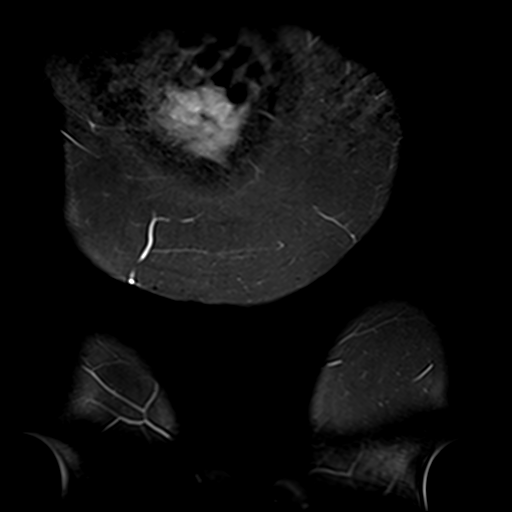
[im 8/30]
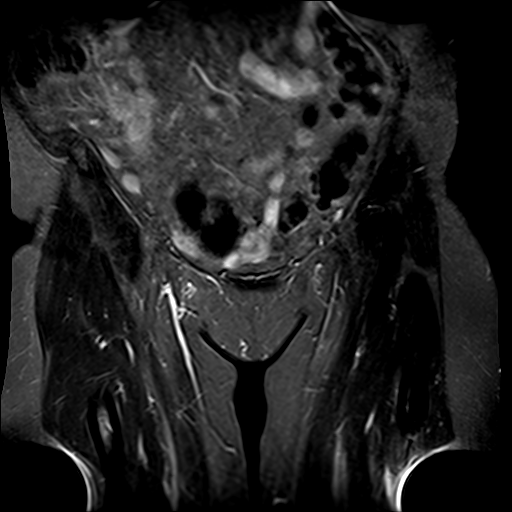
[im 15/30]
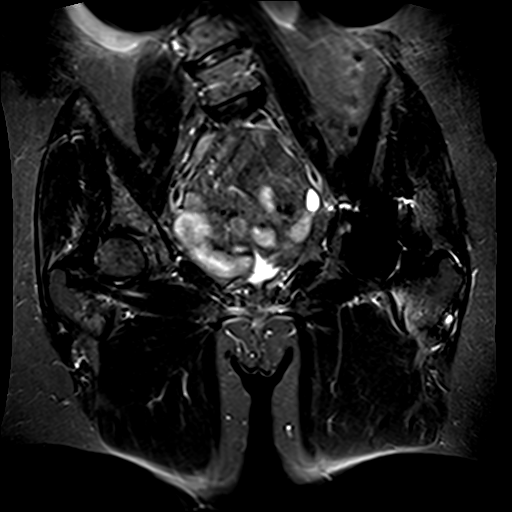
[im 22/30]
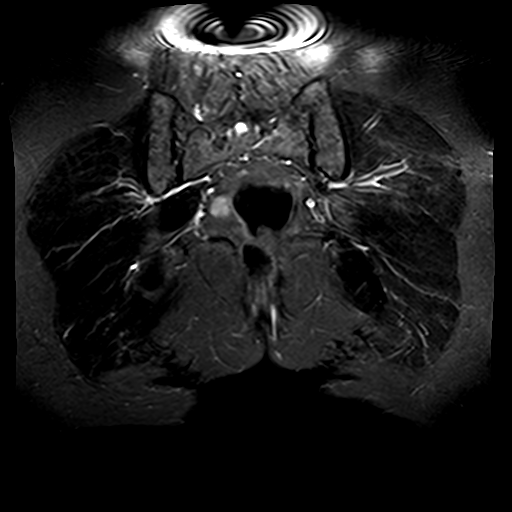
[im 30/30]
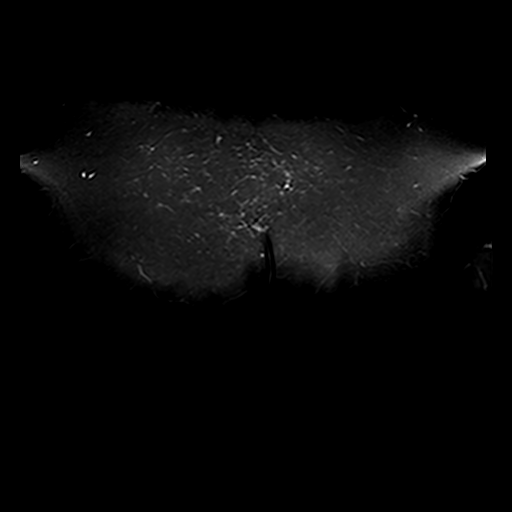

[Series 6: T1 · axial · 4.0mm · 0.68mm/px · z∈[-148,+61]mm · 8 of 50 slices shown (2 of 2)]
[im 1/50]
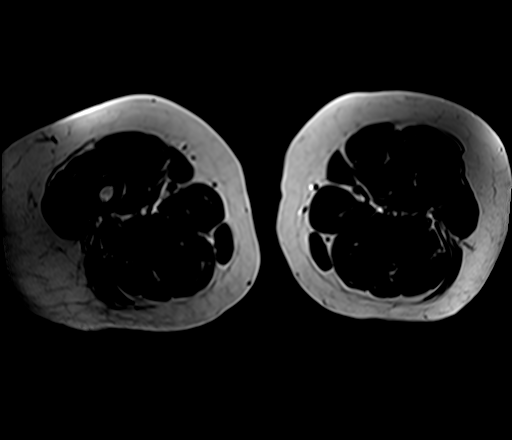
[im 7/50]
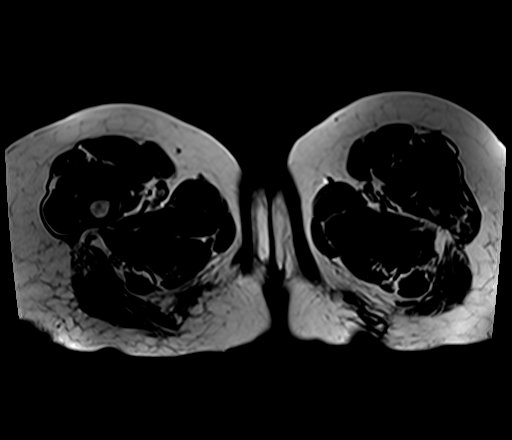
[im 13/50]
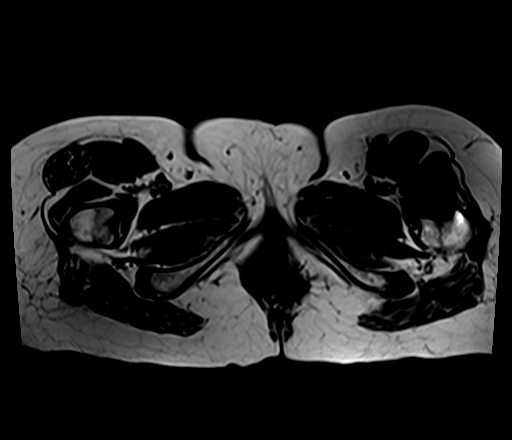
[im 19/50]
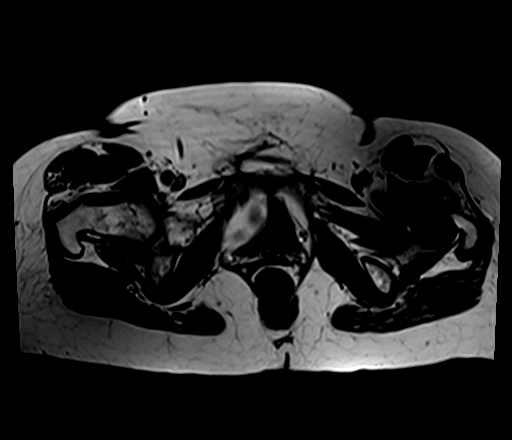
[im 25/50]
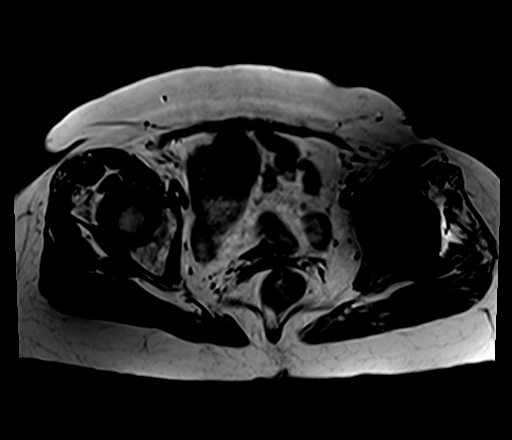
[im 31/50]
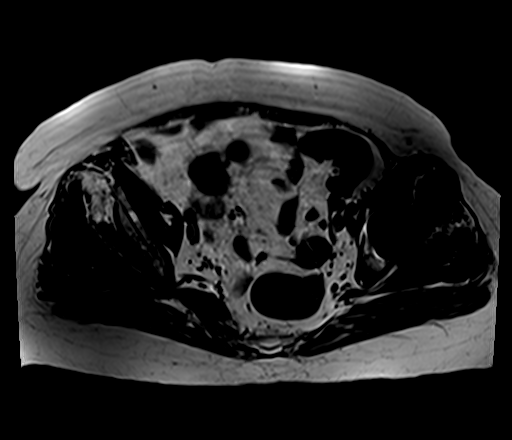
[im 37/50]
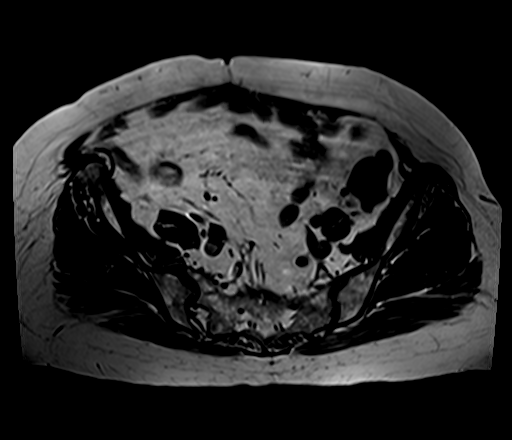
[im 43/50]
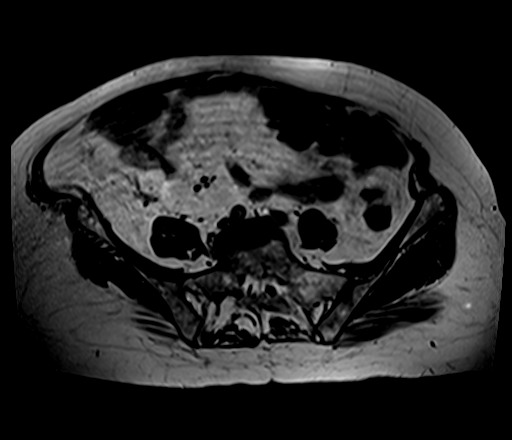

[Series 7: axial t2fs (pelvis) · axial · 4.0mm · 0.68mm/px · z∈[-118,+61]mm · 3 of 50 slices shown]
[im 7/50]
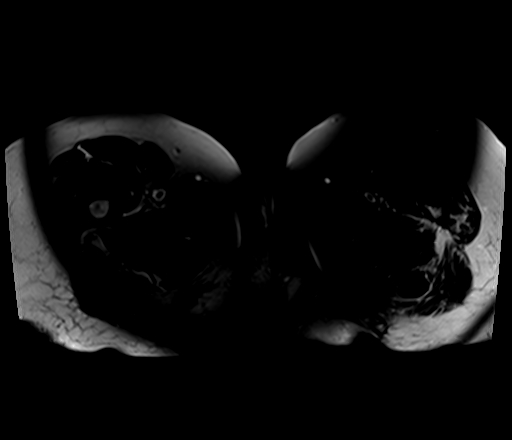
[im 25/50]
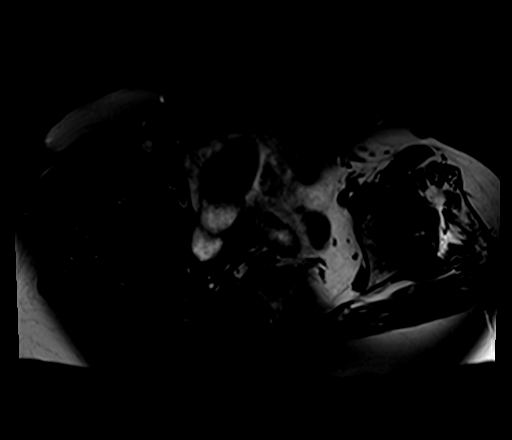
[im 43/50]
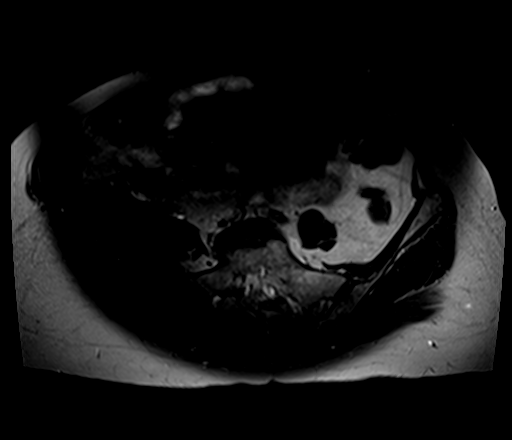

[21 of 48 positions shown; findings below may reference images not displayed]

FINDINGS: Osseous structures and joints: Lower lumbar spondylosis noted no
substantial marrow edema. Left total hip prosthesis is in place.
Pubis unremarkable. No SI joint or hip joint effusion is observed.

Bursa: There is mild left trochanteric bursitis with a component
tracking caudad along the proximal femoral metaphysis.

Musculotendinous: Unremarkable aside from some localized fatty
atrophy of the gluteus minimus muscle on the right on image 21
series 6.

Sacral plexus and proximal sciatic nerves: No observed impinging
lesion or asymmetric edema

Other: Sigmoid colon diverticulosis.  Hysterectomy.
IMPRESSION: 1. Trochanteric bursitis on the left. Patient has a left total hip
prosthesis in place.
2. No significant regional marrow edema.
3. Localized fatty atrophy in part of the right gluteus minimus
muscle.
4. Sigmoid colon diverticulosis.
5. Lower lumbar spondylosis.

## 2021-01-28 DIAGNOSIS — N3281 Overactive bladder: Secondary | ICD-10-CM | POA: Diagnosis not present

## 2021-01-29 DIAGNOSIS — M461 Sacroiliitis, not elsewhere classified: Secondary | ICD-10-CM | POA: Diagnosis not present

## 2021-01-29 DIAGNOSIS — M5416 Radiculopathy, lumbar region: Secondary | ICD-10-CM | POA: Diagnosis not present

## 2021-02-04 ENCOUNTER — Encounter: Payer: Self-pay | Admitting: Orthopaedic Surgery

## 2021-02-04 ENCOUNTER — Ambulatory Visit (INDEPENDENT_AMBULATORY_CARE_PROVIDER_SITE_OTHER): Payer: BC Managed Care – PPO | Admitting: Orthopaedic Surgery

## 2021-02-04 ENCOUNTER — Other Ambulatory Visit: Payer: Self-pay

## 2021-02-04 DIAGNOSIS — T84068A Wear of articular bearing surface of other internal prosthetic joint, initial encounter: Secondary | ICD-10-CM | POA: Diagnosis not present

## 2021-02-04 DIAGNOSIS — Z96649 Presence of unspecified artificial hip joint: Secondary | ICD-10-CM

## 2021-02-04 DIAGNOSIS — Z96642 Presence of left artificial hip joint: Secondary | ICD-10-CM

## 2021-02-04 NOTE — Progress Notes (Signed)
The patient comes in for follow-up after having a MRI of her pelvis.  She has a history of a left total hip arthroplasty was done very long time ago and had a revision of that.  She unfortunately had to have this replacement done at early age.  This was done elsewhere.  We have been following her long-term for polyliner wear.  I x-rayed her hip this year and has not changed in position from last year in terms of the hip ball and the socket is little bit higher.  We sent her for MRI to rule out any significant fluid collections or osteolysis of the left hip but also to assess the right hip because the pain she is having in the right hip.  She has a history of lumbar spine issues and her biggest problem is mobility in terms of if she walks over 100 feet she gets cramping in both her hips and her back and this limits her mobility.  She has already been through extensive physical therapy earlier this year and she said that really did not help her.  On exam both hips move fluidly and smoothly.  The MRI of the pelvis showed no fluid collections around the left hip other than some trochanteric bursitis.  There is no evidence of osteolysis.  There is no significant disease in her right hip.  There is slight atrophy of the gluteus minimus on the right side.  From a hip standpoint, I would like to see her back in 1 year for repeat standing low AP pelvis and lateral of her left hip.  If she develops more osteolysis we would end up recommending a hip liner exchange.

## 2021-02-19 ENCOUNTER — Other Ambulatory Visit: Payer: Self-pay | Admitting: Physician Assistant

## 2021-02-19 DIAGNOSIS — Z1231 Encounter for screening mammogram for malignant neoplasm of breast: Secondary | ICD-10-CM

## 2021-02-26 DIAGNOSIS — M5416 Radiculopathy, lumbar region: Secondary | ICD-10-CM | POA: Diagnosis not present

## 2021-02-26 DIAGNOSIS — M7061 Trochanteric bursitis, right hip: Secondary | ICD-10-CM | POA: Diagnosis not present

## 2021-02-26 DIAGNOSIS — M461 Sacroiliitis, not elsewhere classified: Secondary | ICD-10-CM | POA: Diagnosis not present

## 2021-03-03 ENCOUNTER — Other Ambulatory Visit: Payer: Self-pay | Admitting: Physician Assistant

## 2021-03-03 DIAGNOSIS — E039 Hypothyroidism, unspecified: Secondary | ICD-10-CM

## 2021-03-03 DIAGNOSIS — E785 Hyperlipidemia, unspecified: Secondary | ICD-10-CM

## 2021-03-04 ENCOUNTER — Other Ambulatory Visit: Payer: Self-pay

## 2021-03-04 MED ORDER — NITROGLYCERIN 0.4 MG SL SUBL: 0.4000 mg | SUBLINGUAL_TABLET | SUBLINGUAL | 3 refills | Status: DC | PRN

## 2021-03-20 DIAGNOSIS — H2513 Age-related nuclear cataract, bilateral: Secondary | ICD-10-CM | POA: Diagnosis not present

## 2021-03-20 DIAGNOSIS — H5213 Myopia, bilateral: Secondary | ICD-10-CM | POA: Diagnosis not present

## 2021-03-20 DIAGNOSIS — H524 Presbyopia: Secondary | ICD-10-CM | POA: Diagnosis not present

## 2021-03-20 DIAGNOSIS — E119 Type 2 diabetes mellitus without complications: Secondary | ICD-10-CM | POA: Diagnosis not present

## 2021-03-20 DIAGNOSIS — H52223 Regular astigmatism, bilateral: Secondary | ICD-10-CM | POA: Diagnosis not present

## 2021-03-20 DIAGNOSIS — H11442 Conjunctival cysts, left eye: Secondary | ICD-10-CM | POA: Diagnosis not present

## 2021-03-20 LAB — HM DIABETES EYE EXAM

## 2021-04-01 ENCOUNTER — Encounter: Payer: Self-pay | Admitting: Physician Assistant

## 2021-04-05 ENCOUNTER — Other Ambulatory Visit: Payer: Self-pay

## 2021-04-05 ENCOUNTER — Encounter: Payer: Self-pay | Admitting: Physician Assistant

## 2021-04-05 ENCOUNTER — Ambulatory Visit (INDEPENDENT_AMBULATORY_CARE_PROVIDER_SITE_OTHER): Payer: Medicare HMO | Admitting: Physician Assistant

## 2021-04-05 VITALS — BP 112/68 | HR 74 | Ht <= 58 in | Wt 123.0 lb

## 2021-04-05 DIAGNOSIS — H9042 Sensorineural hearing loss, unilateral, left ear, with unrestricted hearing on the contralateral side: Secondary | ICD-10-CM

## 2021-04-05 DIAGNOSIS — H9193 Unspecified hearing loss, bilateral: Secondary | ICD-10-CM | POA: Insufficient documentation

## 2021-04-05 DIAGNOSIS — H6122 Impacted cerumen, left ear: Secondary | ICD-10-CM | POA: Diagnosis not present

## 2021-04-05 DIAGNOSIS — H938X2 Other specified disorders of left ear: Secondary | ICD-10-CM

## 2021-04-05 MED ORDER — DEBROX 6.5 % OT SOLN
10.0000 [drp] | Freq: Two times a day (BID) | OTIC | 0 refills | Status: DC
Start: 2021-04-05 — End: 2021-05-14

## 2021-04-05 NOTE — Progress Notes (Signed)
Subjective:    Patient ID: Leslie Roberson, female    DOB: [birth year], 66 y.o.   MRN: [id number]  HPI Pt is a 66 yo female who presents to the clinic with left ear hearing loss and pressure with fullness. She has noticed this for quite some time but getting worse. She is frustrated with the hearing loss.   .. Active Ambulatory Problems    Diagnosis Date Noted   GERD (gastroesophageal reflux disease) 11/27/2015   Osteoporosis 11/27/2015   GAD (generalized anxiety disorder) 11/27/2015   MDD (major depressive disorder), recurrent, in full remission ([place]) 11/27/2015   Chronic dryness of both eyes 11/27/2015   Acquired hypothyroidism 11/27/2015   Asthma, chronic 11/27/2015   Insomnia 11/27/2015   Seborrheic keratoses 11/27/2015   Diverticulitis of colon 12/21/2015   Hip bursitis 12/25/2015   Biceps tendonitis on right 12/25/2015   Idiopathic scoliosis 01/22/2016   Vaginal atrophy 01/31/2016   Rosacea 02/04/2016   Right knee pain 02/19/2016   Paresthesia 04/03/2016   Facet hypertrophy of lumbosacral region 04/03/2016   Chronic back pain 04/03/2016   Bad taste in mouth 07/26/2016   No energy 09/15/2016   Lump in throat 01/27/2017   Dysphagia 01/27/2017   Chronic tension-type headache, intractable 02/05/2017   Prediabetes 02/05/2017   Right leg pain 02/06/2017   Atypical chest pain 05/24/2017   Itchy scalp 05/24/2017   Pneumothorax 06/25/2017   Angina, class III ([place]) 07/07/2017   CAD S/P percutaneous coronary angioplasty    Tongue mass 08/14/2017   Excessive sweating 09/22/2017   Rhinorrhea 01/04/2018   Arthralgia 02/05/2018   Nasal dryness 02/06/2018   Hyperlipidemia LDL goal <70 11/29/2018   DJD (degenerative joint disease), lumbosacral 11/29/2018   Pre-operative clearance 11/29/2018   Diabetes mellitus without complication ([place]) 49/44/9675   Migraine without aura and without status migrainosus, not intractable 02/08/2019   OAB (overactive bladder) 02/08/2019    Snoring 05/02/2019   Non-restorative sleep 05/02/2019   OSA on CPAP 05/20/2019   Ingrown right greater toenail 07/19/2019   Status post hip replacement, left 07/19/2019   Left hip pain 07/19/2019   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   Acute conjunctivitis of both eyes 01/23/2020   CAD in native artery 11/27/2015   S/P cardiac cath 07/07/2017   Paroxysmal nocturnal dyspnea 05/14/2020   Orthopnea 05/14/2020   Cough 05/18/2020   Controlled type 2 diabetes mellitus with complication, without long-term current use of insulin ([place]) 11/16/2020   Right hand pain 11/16/2020   Fall 11/16/2020   Bilateral hearing loss 04/05/2021   Impacted cerumen, left ear 04/05/2021   Ear fullness, left 04/08/2021   Resolved Ambulatory Problems    Diagnosis Date Noted   Acute medial meniscus tear 04/23/2016   Left knee pain 04/28/2016   Mild intermittent asthma without complication 91/63/8466   Past Medical History:  Diagnosis Date   Anxiety    Arthritis    Coronary artery disease    Depression    History of blood transfusion    Hypothyroidism    Pneumonia 2016   Pre-diabetes    Thyroid disease      Review of Systems See HPI.     Objective:   Physical Exam  Left ear impacted with cerumen After irrigation cerumen removed but had some erythema of external canal Hearing did not improve with removal of cerumen Gross hearing loss in left ear.    Marland Kitchen.Indication: Cerumen impaction of the ear(s)  Medical necessity statement: On physical examination, cerumen  impairs clinically significant portions of the external auditory canal, and tympanic membrane. Noted obstructive, copious cerumen that cannot be removed without magnification and instrumentations requiring physician skills Consent: Discussed benefits and risks of procedure and verbal consent obtained Procedure: Patient was prepped for the procedure. Utilized an otoscope to assess and take note of the ear canal, the tympanic  membrane, and the presence, amount, and placement of the cerumen. Gentle water irrigation and soft plastic curette was utilized to remove cerumen.  Post procedure examination: shows cerumen was completely removed. Patient tolerated procedure well. The patient is made aware that they may experience temporary vertigo, temporary hearing loss, and temporary discomfort. If these symptom last for more than 24 hours to call the clinic or proceed to the ED.  .. Hearing Screening   500Hz  1000Hz  2000Hz  4000Hz   Right ear 25 25 25 25   Left ear 40 40 40 40       Assessment & Plan:  Marland KitchenMarland KitchenValincia was seen today for ear pain.  Diagnoses and all orders for this visit:  Impacted cerumen, left ear -     carbamide peroxide (DEBROX) 6.5 % OTIC solution; Place 10 drops into the left ear 2 (two) times daily. Dispense 1 bottle -     Ambulatory referral to ENT  Sensorineural hearing loss (SNHL) of left ear with unrestricted hearing of right ear -     Ambulatory referral to ENT  Ear fullness, left -     carbamide peroxide (DEBROX) 6.5 % OTIC solution; Place 10 drops into the left ear 2 (two) times daily. Dispense 1 bottle -     Ambulatory referral to ENT   Left ear was irrigated today Hearing checked after and certainly some gross and dB/Hz hearing loss Will make referral to ENT to evaluate and get hearing check and assessed for hearing aids Debrox as needed for cerumen

## 2021-04-05 NOTE — Patient Instructions (Signed)
Ear Irrigation Ear irrigation is a procedure to wash dirt and wax out of your ear canal. This procedure is also called lavage. You may need ear irrigation if you are having trouble hearing because of a buildup of earwax. You may also have ear irrigation as part of the treatment for an ear infection. Getting wax and dirtout of your ear canal can help ear drops work better. Tell a health care provider about: Any allergies you have. All medicines you are taking, including vitamins, herbs, eye drops, creams, and over-the-counter medicines. Any problems you or family members have had with anesthetic medicines. Any blood disorders you have. Any surgeries you have had. This includes any ear surgeries. Any medical conditions you have. Whether you are pregnant or may be pregnant. What are the risks? Generally, this is a safe procedure. However, problems may occur, including: Infection. Pain. Hearing loss. Fluid and debris being pushed through the eardrum and into the middle ear. This can occur if there are holes in the eardrum. Ear irrigation failing to work. What happens before the procedure? You will talk with your provider about the procedure and plan. You may be given ear drops to put in your ear 15-20 minutes before irrigation. This helps loosen the wax. What happens during the procedure?  A syringe is filled with water or saline solution, which is made of salt and water. The syringe is gently inserted into the ear canal. The fluid is used to flush out wax and other debris. The procedure may vary among health care providers and hospitals. What can I expect after the procedure? After an ear irrigation, follow instructions given to you by your health careprovider. Follow these instructions at home: Using ear irrigation kits Ear irrigation kits are available for use at home. Ask your health care provider if this is an option for you. In general, you should: Use a home irrigation kit only as  told by your health care provider. Read the package instructions carefully. Follow the directions for using the syringe. Use water that is room temperature. Do not do ear irrigation at home if you: Have diabetes. Diabetes increases the risk of infection. Have a hole or tear in your eardrum. Have tubes in your ears. Have had any ear surgery in the past. Have been told not to irrigate your ears. Cleaning your ears  Clean the outside of your ear with a soft washcloth daily. If told by your health care provider, use a few drops of baby oil, mineral oil, glycerin, hydrogen peroxide, or over-the-counter earwax softening drops. Do not use cotton swabs to clean your ears. These can push wax down into the ear canal. Do not put anything into your ears to try to remove wax. This includes ear candles.  General instructions Take over-the-counter and prescription medicines only as told by your health care provider. If you were prescribed an antibiotic medicine, use it as told by your health care provider. Do not stop using the antibiotic even if your condition improves. Keep the ear clean and dry by following the instructions from your health care provider. Keep all follow-up visits. This is important. Visit your health care provider at least once a year to have your ears and hearing checked. Contact a health care provider if: Your hearing is not improving or is getting worse. You have pain or redness in your ear. You are dizzy. You have ringing in your ears. You have nausea or vomiting. You have fluid, blood, or pus coming out of   your ear. Summary Ear irrigation is a procedure to wash dirt and wax out of your ear canal. This procedure is also called lavage. To perform ear irrigation, ear drops may be put in your ear 15-20 minutes before irrigation. Water or saline solution will be used to flush out earwax and other debris. You may be able to irrigate your ears at home. Ask your health care  provider if this is an option for you. Follow your health care provider's instructions. Clean your ears with a soft cloth after irrigation. Do not use cotton swabs to clean your ears. These can push wax down into the ear canal. This information is not intended to replace advice given to you by your health care provider. Make sure you discuss any questions you have with your healthcare provider. Document Revised: 07/05/2019 Document Reviewed: 07/05/2019 Elsevier Patient Education  2022 Elsevier Inc.  

## 2021-04-06 ENCOUNTER — Encounter: Payer: Self-pay | Admitting: Physician Assistant

## 2021-04-08 ENCOUNTER — Encounter: Payer: Self-pay | Admitting: Physician Assistant

## 2021-04-08 DIAGNOSIS — H938X2 Other specified disorders of left ear: Secondary | ICD-10-CM | POA: Insufficient documentation

## 2021-04-10 ENCOUNTER — Other Ambulatory Visit: Payer: Self-pay

## 2021-04-10 ENCOUNTER — Ambulatory Visit (INDEPENDENT_AMBULATORY_CARE_PROVIDER_SITE_OTHER): Payer: Medicare HMO

## 2021-04-10 DIAGNOSIS — Z1231 Encounter for screening mammogram for malignant neoplasm of breast: Secondary | ICD-10-CM | POA: Diagnosis not present

## 2021-04-11 NOTE — Progress Notes (Signed)
Normal mammogram. Follow up in 1 year.

## 2021-04-18 DIAGNOSIS — H04123 Dry eye syndrome of bilateral lacrimal glands: Secondary | ICD-10-CM | POA: Diagnosis not present

## 2021-04-18 DIAGNOSIS — D23121 Other benign neoplasm of skin of left upper eyelid, including canthus: Secondary | ICD-10-CM | POA: Diagnosis not present

## 2021-04-18 DIAGNOSIS — D23111 Other benign neoplasm of skin of right upper eyelid, including canthus: Secondary | ICD-10-CM | POA: Diagnosis not present

## 2021-04-22 DIAGNOSIS — L218 Other seborrheic dermatitis: Secondary | ICD-10-CM | POA: Diagnosis not present

## 2021-05-01 DIAGNOSIS — H6122 Impacted cerumen, left ear: Secondary | ICD-10-CM | POA: Diagnosis not present

## 2021-05-01 DIAGNOSIS — H903 Sensorineural hearing loss, bilateral: Secondary | ICD-10-CM | POA: Diagnosis not present

## 2021-05-08 ENCOUNTER — Other Ambulatory Visit: Payer: Self-pay | Admitting: Neurology

## 2021-05-08 DIAGNOSIS — E118 Type 2 diabetes mellitus with unspecified complications: Secondary | ICD-10-CM

## 2021-05-08 DIAGNOSIS — E039 Hypothyroidism, unspecified: Secondary | ICD-10-CM

## 2021-05-08 DIAGNOSIS — N3281 Overactive bladder: Secondary | ICD-10-CM

## 2021-05-08 DIAGNOSIS — K2100 Gastro-esophageal reflux disease with esophagitis, without bleeding: Secondary | ICD-10-CM

## 2021-05-08 DIAGNOSIS — R202 Paresthesia of skin: Secondary | ICD-10-CM

## 2021-05-08 MED ORDER — MIRABEGRON ER 50 MG PO TB24: 50.0000 mg | ORAL_TABLET | Freq: Every day | ORAL | 2 refills | Status: DC

## 2021-05-08 MED ORDER — GABAPENTIN 300 MG PO CAPS: ORAL_CAPSULE | ORAL | 0 refills | Status: DC

## 2021-05-08 MED ORDER — LEVOTHYROXINE SODIUM 137 MCG PO TABS: 137.0000 ug | ORAL_TABLET | ORAL | 0 refills | Status: DC

## 2021-05-08 MED ORDER — METFORMIN HCL ER 500 MG PO TB24: ORAL_TABLET | ORAL | 0 refills | Status: DC

## 2021-05-08 MED ORDER — PANTOPRAZOLE SODIUM 40 MG PO TBEC: 40.0000 mg | DELAYED_RELEASE_TABLET | Freq: Every day | ORAL | 2 refills | Status: DC

## 2021-05-13 DIAGNOSIS — F333 Major depressive disorder, recurrent, severe with psychotic symptoms: Secondary | ICD-10-CM | POA: Diagnosis not present

## 2021-05-14 ENCOUNTER — Ambulatory Visit (INDEPENDENT_AMBULATORY_CARE_PROVIDER_SITE_OTHER): Payer: Medicare HMO | Admitting: Physician Assistant

## 2021-05-14 ENCOUNTER — Other Ambulatory Visit: Payer: Self-pay

## 2021-05-14 ENCOUNTER — Encounter: Payer: Self-pay | Admitting: Physician Assistant

## 2021-05-14 VITALS — BP 119/72 | HR 78 | Ht <= 58 in | Wt 125.0 lb

## 2021-05-14 DIAGNOSIS — R2231 Localized swelling, mass and lump, right upper limb: Secondary | ICD-10-CM

## 2021-05-14 DIAGNOSIS — Z1329 Encounter for screening for other suspected endocrine disorder: Secondary | ICD-10-CM

## 2021-05-14 DIAGNOSIS — M810 Age-related osteoporosis without current pathological fracture: Secondary | ICD-10-CM | POA: Diagnosis not present

## 2021-05-14 DIAGNOSIS — Z0000 Encounter for general adult medical examination without abnormal findings: Secondary | ICD-10-CM | POA: Diagnosis not present

## 2021-05-14 DIAGNOSIS — E039 Hypothyroidism, unspecified: Secondary | ICD-10-CM

## 2021-05-14 DIAGNOSIS — E118 Type 2 diabetes mellitus with unspecified complications: Secondary | ICD-10-CM | POA: Diagnosis not present

## 2021-05-14 DIAGNOSIS — E785 Hyperlipidemia, unspecified: Secondary | ICD-10-CM | POA: Diagnosis not present

## 2021-05-14 LAB — POCT GLYCOSYLATED HEMOGLOBIN (HGB A1C): Hemoglobin A1C: 6 % — AB (ref 4.0–5.6)

## 2021-05-14 NOTE — Patient Instructions (Signed)
Will call to get scheduled for prolia.

## 2021-05-14 NOTE — Progress Notes (Signed)
Subjective:    Patient ID: Leslie Roberson, female    DOB: 10-14-1955, 66 y.o.   MRN: [id number]  HPI Needs  Leslie Roberson   .Marland Kitchen Active Ambulatory Problems    Diagnosis Date Noted   GERD (gastroesophageal reflux disease) 11/27/2015   Osteoporosis 11/27/2015   GAD (generalized anxiety disorder) 11/27/2015   MDD (major depressive disorder), recurrent, in full remission ([place]) 11/27/2015   Chronic dryness of both eyes 11/27/2015   Acquired hypothyroidism 11/27/2015   Asthma, chronic 11/27/2015   Insomnia 11/27/2015   Seborrheic keratoses 11/27/2015   Diverticulitis of colon 12/21/2015   Hip bursitis 12/25/2015   Biceps tendonitis on right 12/25/2015   Idiopathic scoliosis 01/22/2016   Vaginal atrophy 01/31/2016   Rosacea 02/04/2016   Right knee pain 02/19/2016   Paresthesia 04/03/2016   Facet hypertrophy of lumbosacral region 04/03/2016   Chronic back pain 04/03/2016   Bad taste in mouth 07/26/2016   No energy 09/15/2016   Lump in throat 01/27/2017   Dysphagia 01/27/2017   Chronic tension-type headache, intractable 02/05/2017   Prediabetes 02/05/2017   Right leg pain 02/06/2017   Atypical chest pain 05/24/2017   Itchy scalp 05/24/2017   Pneumothorax 06/25/2017   Angina, class III ([place]) 07/07/2017   CAD S/P percutaneous coronary angioplasty    Tongue mass 08/14/2017   Excessive sweating 09/22/2017   Rhinorrhea 01/04/2018   Arthralgia 02/05/2018   Nasal dryness 02/06/2018   Hyperlipidemia LDL goal <70 11/29/2018   DJD (degenerative joint disease), lumbosacral 11/29/2018   Pre-operative clearance 11/29/2018   Diabetes mellitus without complication ([place]) 00/92/3300   Migraine without aura and without status migrainosus, not intractable 02/08/2019   OAB (overactive bladder) 02/08/2019   Snoring 05/02/2019   Non-restorative sleep 05/02/2019   OSA on CPAP 05/20/2019   Ingrown right greater toenail 07/19/2019   Status post hip replacement, left 07/19/2019   Left hip  pain 07/19/2019   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   Acute conjunctivitis of both eyes 01/23/2020   CAD in native artery 11/27/2015   S/P cardiac cath 07/07/2017   Paroxysmal nocturnal dyspnea 05/14/2020   Orthopnea 05/14/2020   Cough 05/18/2020   Controlled type 2 diabetes mellitus with complication, without long-term current use of insulin ([place]) 11/16/2020   Right hand pain 11/16/2020   Fall 11/16/2020   Bilateral hearing loss 04/05/2021   Impacted cerumen, left ear 04/05/2021   Ear fullness, left 04/08/2021   Mass of right axilla 05/15/2021   Resolved Ambulatory Problems    Diagnosis Date Noted   Acute medial meniscus tear 04/23/2016   Left knee pain 04/28/2016   Mild intermittent asthma without complication 76/22/6333   Past Medical History:  Diagnosis Date   Anxiety    Arthritis    Coronary artery disease    Depression    History of blood transfusion    Hypothyroidism    Pneumonia 2016   Pre-diabetes    Thyroid disease      Review of Systems See HPI.     Objective:   Physical Exam Vitals reviewed.  Constitutional:      Appearance: Normal appearance.  HENT:     Head: Normocephalic.  Cardiovascular:     Rate and Rhythm: Normal rate and regular rhythm.     Pulses: Normal pulses.     Heart sounds: Normal heart sounds.  Pulmonary:     Effort: Pulmonary effort is normal.     Breath sounds: Normal breath sounds.  Musculoskeletal:  Comments: Visible scoliosis.   Skin:    Comments: No prominent right axilla mass palpated. Area of tenderness like inflamed sebaceous cyst.   Neurological:     General: No focal deficit present.     Mental Status: She is alert.  Psychiatric:        Mood and Affect: Mood normal.     Results for orders placed or performed in visit on 05/14/21  TSH  Result Value Ref Range   TSH 0.31 (L) 0.40 - 4.50 mIU/L  COMPLETE METABOLIC PANEL WITH GFR  Result Value Ref Range   Glucose, Bld 89 65 - 99 mg/dL   BUN 22 7  - 25 mg/dL   Creat 0.99 0.50 - 1.05 mg/dL   eGFR 63 > OR = 60 mL/min/1.2m   BUN/Creatinine Ratio NOT APPLICABLE 6 - 22 (calc)   Sodium 142 135 - 146 mmol/L   Potassium 4.5 3.5 - 5.3 mmol/L   Chloride 105 98 - 110 mmol/L   CO2 27 20 - 32 mmol/L   Calcium 9.9 8.6 - 10.4 mg/dL   Total Protein 6.6 6.1 - 8.1 g/dL   Albumin 4.1 3.6 - 5.1 g/dL   Globulin 2.5 1.9 - 3.7 g/dL (calc)   AG Ratio 1.6 1.0 - 2.5 (calc)   Total Bilirubin 0.3 0.2 - 1.2 mg/dL   Alkaline phosphatase (APISO) 64 37 - 153 U/L   AST 17 10 - 35 U/L   ALT 21 6 - 29 U/L  CBC with Differential/Platelet  Result Value Ref Range   WBC 6.3 3.8 - 10.8 Thousand/uL   RBC 4.83 3.80 - 5.10 Million/uL   Hemoglobin 14.4 11.7 - 15.5 g/dL   HCT 43.6 35.0 - 45.0 %   MCV 90.3 80.0 - 100.0 fL   MCH 29.8 27.0 - 33.0 pg   MCHC 33.0 32.0 - 36.0 g/dL   RDW 13.3 11.0 - 15.0 %   Platelets 232 140 - 400 Thousand/uL   MPV 12.2 7.5 - 12.5 fL   Neutro Abs 4,523 1,500 - 7,800 cells/uL   Lymphs Abs 1,128 850 - 3,900 cells/uL   Absolute Monocytes 460 200 - 950 cells/uL   Eosinophils Absolute 139 15 - 500 cells/uL   Basophils Absolute 50 0 - 200 cells/uL   Neutrophils Relative % 71.8 %   Total Lymphocyte 17.9 %   Monocytes Relative 7.3 %   Eosinophils Relative 2.2 %   Basophils Relative 0.8 %  POCT glycosylated hemoglobin (Hb A1C)  Result Value Ref Range   Hemoglobin A1C 6.0 (A) 4.0 - 5.6 %   HbA1c POC (<> result, manual entry)     HbA1c, POC (prediabetic range)     HbA1c, POC (controlled diabetic range)          Assessment & Plan:  .Marland KitchenMarland KitchenElfawas seen today for follow-up.  Diagnoses and all orders for this visit:  Controlled type 2 diabetes mellitus with complication, without long-term current use of insulin ([facility]) -     COMPLETE METABOLIC PANEL WITH GFR -     POCT glycosylated hemoglobin (Hb A1C)  Acquired hypothyroidism -     TSH -     CBC with Differential/Platelet  Hyperlipidemia LDL goal <70 -     CBC with  Differential/Platelet  Mass of right axilla -     CBC with Differential/Platelet  Thyroid disorder screening -     TSH  Age-related osteoporosis without current pathological fracture   Reassured patient about right axilla mass. Suspect start of sebaceous  cyst. Warm compresses. If becomes bigger please follow up. Mammogram UTD.   Needs prolia to be scheduled. Cmp ordered. Please schedule.   Will adjust thyroid medication as needed.

## 2021-05-15 ENCOUNTER — Encounter: Payer: Self-pay | Admitting: Physician Assistant

## 2021-05-15 DIAGNOSIS — R2231 Localized swelling, mass and lump, right upper limb: Secondary | ICD-10-CM | POA: Insufficient documentation

## 2021-05-15 LAB — CBC WITH DIFFERENTIAL/PLATELET
Absolute Monocytes: 460 cells/uL (ref 200–950)
Basophils Absolute: 50 cells/uL (ref 0–200)
Basophils Relative: 0.8 %
Eosinophils Absolute: 139 cells/uL (ref 15–500)
Eosinophils Relative: 2.2 %
HCT: 43.6 % (ref 35.0–45.0)
Hemoglobin: 14.4 g/dL (ref 11.7–15.5)
Lymphs Abs: 1128 cells/uL (ref 850–3900)
MCH: 29.8 pg (ref 27.0–33.0)
MCHC: 33 g/dL (ref 32.0–36.0)
MCV: 90.3 fL (ref 80.0–100.0)
MPV: 12.2 fL (ref 7.5–12.5)
Monocytes Relative: 7.3 %
Neutro Abs: 4523 cells/uL (ref 1500–7800)
Neutrophils Relative %: 71.8 %
Platelets: 232 10*3/uL (ref 140–400)
RBC: 4.83 10*6/uL (ref 3.80–5.10)
RDW: 13.3 % (ref 11.0–15.0)
Total Lymphocyte: 17.9 %
WBC: 6.3 10*3/uL (ref 3.8–10.8)

## 2021-05-15 LAB — COMPLETE METABOLIC PANEL WITH GFR
AG Ratio: 1.6 (calc) (ref 1.0–2.5)
ALT: 21 U/L (ref 6–29)
AST: 17 U/L (ref 10–35)
Albumin: 4.1 g/dL (ref 3.6–5.1)
Alkaline phosphatase (APISO): 64 U/L (ref 37–153)
BUN: 22 mg/dL (ref 7–25)
CO2: 27 mmol/L (ref 20–32)
Calcium: 9.9 mg/dL (ref 8.6–10.4)
Chloride: 105 mmol/L (ref 98–110)
Creat: 0.99 mg/dL (ref 0.50–1.05)
Globulin: 2.5 g/dL (calc) (ref 1.9–3.7)
Glucose, Bld: 89 mg/dL (ref 65–99)
Potassium: 4.5 mmol/L (ref 3.5–5.3)
Sodium: 142 mmol/L (ref 135–146)
Total Bilirubin: 0.3 mg/dL (ref 0.2–1.2)
Total Protein: 6.6 g/dL (ref 6.1–8.1)
eGFR: 63 mL/min/{1.73_m2} (ref >=60)

## 2021-05-15 LAB — TSH: TSH: 0.31 mIU/L — ABNORMAL LOW (ref 0.40–4.50)

## 2021-05-15 NOTE — Progress Notes (Signed)
TSH is a little low. When we see this we usually back off of dose of levothyroxine. I have you taking 158mcg twice a week? Is that correct?   Ok for prolia to be scheduled.

## 2021-05-17 ENCOUNTER — Other Ambulatory Visit: Payer: Self-pay

## 2021-05-17 ENCOUNTER — Ambulatory Visit: Payer: BC Managed Care – PPO | Admitting: Physician Assistant

## 2021-05-17 ENCOUNTER — Other Ambulatory Visit: Payer: Self-pay | Admitting: Neurology

## 2021-05-17 DIAGNOSIS — E039 Hypothyroidism, unspecified: Secondary | ICD-10-CM

## 2021-05-17 DIAGNOSIS — E785 Hyperlipidemia, unspecified: Secondary | ICD-10-CM

## 2021-05-17 MED ORDER — LEVOTHYROXINE SODIUM 137 MCG PO TABS: ORAL_TABLET | ORAL | 0 refills | Status: DC

## 2021-05-17 MED ORDER — METOPROLOL SUCCINATE ER 25 MG PO TB24: ORAL_TABLET | ORAL | 3 refills | Status: DC

## 2021-05-17 MED ORDER — ATORVASTATIN CALCIUM 20 MG PO TABS: 20.0000 mg | ORAL_TABLET | Freq: Every day | ORAL | 3 refills | Status: DC

## 2021-05-25 ENCOUNTER — Other Ambulatory Visit: Payer: Self-pay | Admitting: Physician Assistant

## 2021-05-25 DIAGNOSIS — E039 Hypothyroidism, unspecified: Secondary | ICD-10-CM

## 2021-05-28 ENCOUNTER — Other Ambulatory Visit: Payer: Self-pay | Admitting: Physician Assistant

## 2021-05-28 ENCOUNTER — Encounter: Payer: Self-pay | Admitting: Physician Assistant

## 2021-05-28 DIAGNOSIS — E118 Type 2 diabetes mellitus with unspecified complications: Secondary | ICD-10-CM

## 2021-05-29 DIAGNOSIS — M5416 Radiculopathy, lumbar region: Secondary | ICD-10-CM | POA: Diagnosis not present

## 2021-05-29 DIAGNOSIS — M461 Sacroiliitis, not elsewhere classified: Secondary | ICD-10-CM | POA: Diagnosis not present

## 2021-05-29 DIAGNOSIS — M7061 Trochanteric bursitis, right hip: Secondary | ICD-10-CM | POA: Diagnosis not present

## 2021-05-29 NOTE — Telephone Encounter (Signed)
See other MyChart message

## 2021-05-31 ENCOUNTER — Encounter: Payer: Self-pay | Admitting: Physician Assistant

## 2021-05-31 MED ORDER — OXYBUTYNIN CHLORIDE ER 5 MG PO TB24: 5.0000 mg | ORAL_TABLET | Freq: Every day | ORAL | 0 refills | Status: DC

## 2021-06-02 ENCOUNTER — Encounter: Payer: Self-pay | Admitting: Physician Assistant

## 2021-06-03 MED ORDER — OXYBUTYNIN CHLORIDE ER 5 MG PO TB24: 5.0000 mg | ORAL_TABLET | Freq: Every day | ORAL | 1 refills | Status: DC

## 2021-06-05 ENCOUNTER — Encounter: Payer: Self-pay | Admitting: Physician Assistant

## 2021-06-07 ENCOUNTER — Telehealth: Payer: Self-pay

## 2021-06-07 NOTE — Telephone Encounter (Signed)
Sent message to patient letting her know approved and she can schedule appt.

## 2021-06-07 NOTE — Telephone Encounter (Signed)
[employer] faxed authorization good from 06/06/21 thru 03/30/22 for Prolia. Authorization will be scanned into chart.  ?

## 2021-06-10 DIAGNOSIS — L298 Other pruritus: Secondary | ICD-10-CM | POA: Diagnosis not present

## 2021-06-10 DIAGNOSIS — D1801 Hemangioma of skin and subcutaneous tissue: Secondary | ICD-10-CM | POA: Diagnosis not present

## 2021-06-10 DIAGNOSIS — L720 Epidermal cyst: Secondary | ICD-10-CM | POA: Diagnosis not present

## 2021-06-10 DIAGNOSIS — D225 Melanocytic nevi of trunk: Secondary | ICD-10-CM | POA: Diagnosis not present

## 2021-06-10 DIAGNOSIS — L821 Other seborrheic keratosis: Secondary | ICD-10-CM | POA: Diagnosis not present

## 2021-06-11 ENCOUNTER — Ambulatory Visit (INDEPENDENT_AMBULATORY_CARE_PROVIDER_SITE_OTHER): Payer: Medicare HMO | Admitting: Physician Assistant

## 2021-06-11 ENCOUNTER — Other Ambulatory Visit: Payer: Self-pay

## 2021-06-11 VITALS — BP 104/60 | HR 83

## 2021-06-11 DIAGNOSIS — M810 Age-related osteoporosis without current pathological fracture: Secondary | ICD-10-CM

## 2021-06-11 MED ORDER — DENOSUMAB 60 MG/ML ~~LOC~~ SOSY
60.0000 mg | PREFILLED_SYRINGE | Freq: Once | SUBCUTANEOUS | Status: AC
  Administered 2021-06-11: 60 mg via SUBCUTANEOUS

## 2021-06-11 NOTE — Progress Notes (Signed)
Agree with above plan. 

## 2021-06-11 NOTE — Progress Notes (Signed)
? ?Established Patient Office Visit ? ?Subjective:  ?Patient ID: Leslie Roberson, female    DOB: 12-Apr-1955  Age: 66 y.o. MRN: [id number] ? ?CC:  ?Chief Complaint  ?Patient presents with  ? Injections  ? ? ?HPI ?Leslie Roberson presents for Prolia injection.  ? ?Past Medical History:  ?Diagnosis Date  ? Anxiety   ? Arthritis   ? "right little finger; base of thumb left hand; spine" (07/07/2017)  ? Asthma, chronic 11/27/2015  ? Chronic back pain   ? Coronary artery disease   ? 4/19 PCI/DEx2 to LAD, and PCI/DES x1 to RCA, normal EF  ? Depression   ? Diverticulitis of colon 12/21/2015  ? Colonoscopy every 5 years/digestive health.   ? GERD (gastroesophageal reflux disease) 11/27/2015  ? History of blood transfusion   ? "related to spinal fusions"  ? Hypothyroidism   ? Idiopathic scoliosis 01/22/2016  ? Osteoporosis 11/27/2015  ? On prolia.   ? Pneumonia 2016  ? Pneumothorax 06/26/2017  ? Pre-diabetes   ? Thyroid disease   ? ? ?Past Surgical History:  ?Procedure Laterality Date  ? ABDOMINAL HYSTERECTOMY    ? bladder mesh    ? S/P bladder sling"  ? CORONARY STENT INTERVENTION N/A 07/07/2017  ? Procedure: CORONARY STENT INTERVENTION;  Surgeon: Leonie Man, MD;  Location: [facility];  Service: Cardiovascular;  Laterality: N/A;  ? ELBOW SURGERY Right   ? "dislocation"  ? INCONTINENCE SURGERY    ? "didn't work"  ? INTRAVASCULAR PRESSURE WIRE/FFR STUDY N/A 07/07/2017  ? Procedure: INTRAVASCULAR PRESSURE WIRE/FFR STUDY;  Surgeon: Leonie Man, MD;  Location: [facility];  Service: Cardiovascular;  Laterality: N/A;  ? JOINT REPLACEMENT    ? LEFT HEART CATH AND CORONARY ANGIOGRAPHY N/A 07/07/2017  ? Procedure: LEFT HEART CATH AND CORONARY ANGIOGRAPHY;  Surgeon: Leonie Man, MD;  Location: [facility];  Service: Cardiovascular;  Laterality: N/A;  ? SPINAL FUSION  ~ 1970 X 3  ? "below neck - lower back"  ? TONSILLECTOMY    ? TOTAL HIP ARTHROPLASTY Left   ? ? ?Family History  ?Problem Relation Age of  Onset  ? Cancer Mother   ?     lung  ? Depression Mother   ? Heart attack Father   ? Hyperlipidemia Father   ? Hypertension Father   ? Diabetes Maternal Aunt   ? Hyperlipidemia Paternal Aunt   ? COPD Paternal Aunt   ? ? ?Social History  ? ?Socioeconomic History  ? Marital status: Married  ?  Spouse name: Not on file  ? Number of children: 1  ? Years of education: Not on file  ? Highest education level: Not on file  ?Occupational History  ? Not on file  ?Tobacco Use  ? Smoking status: Never  ? Smokeless tobacco: Never  ?Vaping Use  ? Vaping Use: Never used  ?Substance and Sexual Activity  ? Alcohol use: Yes  ?  Comment: 07/07/2017 "5-6 times/year; 1 drink at each occasion"  ? Drug use: No  ? Sexual activity: Yes  ?Other Topics Concern  ? Not on file  ?Social History Narrative  ? Not on file  ? ?Social Determinants of Health  ? ?Financial Resource Strain: Not on file  ?Food Insecurity: Not on file  ?Transportation Needs: Not on file  ?Physical Activity: Not on file  ?Stress: Not on file  ?Social Connections: Not on file  ?Intimate Partner Violence: Not on file  ? ? ?  Outpatient Medications Prior to Visit  ?Medication Sig Dispense Refill  ? albuterol (PROVENTIL HFA;VENTOLIN HFA) 108 (90 Base) MCG/ACT inhaler Inhale 1-2 puffs into the lungs every 4 (four) hours as needed for wheezing or shortness of breath. 1 Inhaler 3  ? aspirin EC 81 MG tablet Take 81 mg by mouth at bedtime.    ? atorvastatin (LIPITOR) 20 MG tablet Take 1 tablet (20 mg total) by mouth daily. 90 tablet 3  ? busPIRone (BUSPAR) 15 MG tablet Take 1 tablet (15 mg total) by mouth daily. 30 tablet 0  ? calcium-vitamin D (OSCAL WITH D) 500-200 MG-UNIT tablet Take 1 tablet by mouth daily.    ? clonazePAM (KLONOPIN) 0.5 MG tablet Take 0.25 mg by mouth at bedtime.     ? denosumab (PROLIA) 60 MG/ML SOLN injection Inject 60 mg into the skin every 6 (six) months. Administer in upper arm, thigh, or abdomen    ? desvenlafaxine (PRISTIQ) 100 MG 24 hr tablet Take 100 mg  by mouth at bedtime 30 tablet 0  ? diclofenac sodium (VOLTAREN) 1 % GEL APPLY 4G TOPICALLY 4 TIMES DAILY 100 g 1  ? gabapentin (NEURONTIN) 300 MG capsule TAKE 1 CAPSULE BY MOUTH IN THE MORNING AND 2 CAPSULES AT BEDTIME (Patient taking differently: Take 600 mg by mouth at bedtime.) 270 capsule 0  ? levothyroxine (SYNTHROID) 137 MCG tablet Take 1/2 tablet 6 days a week, 1 tablet 1 day per week 48 tablet 1  ? lidocaine (LIDODERM) 5 % Place 1 patch onto the skin daily. Remove & Discard patch within 12 hours or as directed by MD 30 patch 12  ? Menthol, Topical Analgesic, (MINERAL ICE EX) Apply 1 application to affected sites one to two times a day as needed (for pain)    ? metFORMIN (GLUCOPHAGE-XR) 500 MG 24 hr tablet TAKE 1 TABLET BY MOUTH EVERY DAY WITH BREAKFAST 90 tablet 0  ? metoprolol succinate (TOPROL-XL) 25 MG 24 hr tablet TAKE 1 TABLET (25 MG TOTAL) BY MOUTH DAILY. 90 tablet 3  ? nitroGLYCERIN (NITROSTAT) 0.4 MG SL tablet Place 1 tablet (0.4 mg total) under the tongue every 5 (five) minutes as needed. 25 tablet 3  ? Omega-3 Fatty Acids (FISH OIL PO) Take 1 capsule by mouth daily.     ? oxybutynin (DITROPAN-XL) 5 MG 24 hr tablet Take 1 tablet (5 mg total) by mouth daily. 90 tablet 1  ? pantoprazole (PROTONIX) 40 MG tablet Take 1 tablet (40 mg total) by mouth daily. 90 tablet 2  ? REXULTI 1 MG TABS Take 1 tablet by mouth daily.     ? traZODone (DESYREL) 150 MG tablet Take 150 mg by mouth at bedtime.    ? mirabegron ER (MYRBETRIQ) 50 MG TB24 tablet Take 1 tablet (50 mg total) by mouth daily. 90 tablet 2  ? ?No facility-administered medications prior to visit.  ? ? ?Allergies  ?Allergen Reactions  ? Hydromorphone Rash  ? Levofloxacin Other (See Comments)  ?  Hallucinations  ? Meperidine Nausea And Vomiting and Other (See Comments)  ?  Also "doesn't work well for my pain"  ? Broccoli [Brassica Oleracea] Nausea Only and Other (See Comments)  ?  Causes stomach pain also  ? Adhesive [Tape] Rash  ? ? ?ROS ?Review of  Systems ? ?  ?Objective:  ?  ?Physical Exam ? ?BP 104/60   Pulse 83   SpO2 100%  ?Wt Readings from Last 3 Encounters:  ?05/14/21 125 lb (56.7 kg)  ?04/05/21 123 lb (  55.8 kg)  ?11/14/20 127 lb (57.6 kg)  ? ? ? ?Health Maintenance Due  ?Topic Date Due  ? COVID-19 Vaccine (5 - Booster) 11/17/2020  ? PAP SMEAR-Modifier  01/29/2021  ? ? ?There are no preventive care reminders to display for this patient. ? ?Lab Results  ?Component Value Date  ? TSH 0.31 (L) 05/14/2021  ? ?Lab Results  ?Component Value Date  ? WBC 6.3 05/14/2021  ? HGB 14.4 05/14/2021  ? HCT 43.6 05/14/2021  ? MCV 90.3 05/14/2021  ? PLT 232 05/14/2021  ? ?Lab Results  ?Component Value Date  ? NA 142 05/14/2021  ? K 4.5 05/14/2021  ? CO2 27 05/14/2021  ? GLUCOSE 89 05/14/2021  ? BUN 22 05/14/2021  ? CREATININE 0.99 05/14/2021  ? BILITOT 0.3 05/14/2021  ? ALKPHOS 73 12/09/2017  ? AST 17 05/14/2021  ? ALT 21 05/14/2021  ? PROT 6.6 05/14/2021  ? ALBUMIN 4.4 12/09/2017  ? CALCIUM 9.9 05/14/2021  ? ANIONGAP 9 07/08/2017  ? EGFR 63 05/14/2021  ? ?Lab Results  ?Component Value Date  ? CHOL 135 05/08/2020  ? ?Lab Results  ?Component Value Date  ? HDL 80 05/08/2020  ? ?Lab Results  ?Component Value Date  ? [facility] 41 05/08/2020  ? ?Lab Results  ?Component Value Date  ? TRIG 68 05/08/2020  ? ?Lab Results  ?Component Value Date  ? CHOLHDL 1.7 05/08/2020  ? ?Lab Results  ?Component Value Date  ? HGBA1C 6.0 (A) 05/14/2021  ? ? ?  ?Assessment & Plan:  ?Osteoporosis - Patient tolerated injection well without complications. Patient advised to schedule next injection 6 months from today.   ? ?Problem List Items Addressed This Visit   ? ? Osteoporosis - Primary  ? ? ?Meds ordered this encounter  ?Medications  ? denosumab (PROLIA) injection 60 mg  ?  Order Specific Question:   Patient is enrolled in [facility] program for this medication and I have provided a copy of the Prolia Medication Guide and Patient Brochure.  ?  Answer:   No  ?  Order Specific Question:   I have reviewed  with the patient the information in the Prolia Medication Guide and Patient Counseling Chart including the serious risks of Prolia and symptoms of each risk.  ?  Answer:   Yes  ?  Order Specific Question:   I

## 2021-06-12 DIAGNOSIS — M461 Sacroiliitis, not elsewhere classified: Secondary | ICD-10-CM | POA: Diagnosis not present

## 2021-07-03 DIAGNOSIS — M461 Sacroiliitis, not elsewhere classified: Secondary | ICD-10-CM | POA: Diagnosis not present

## 2021-07-03 DIAGNOSIS — M5416 Radiculopathy, lumbar region: Secondary | ICD-10-CM | POA: Diagnosis not present

## 2021-07-10 ENCOUNTER — Encounter: Payer: Self-pay | Admitting: Nurse Practitioner

## 2021-07-10 ENCOUNTER — Ambulatory Visit: Payer: Medicare HMO | Admitting: Nurse Practitioner

## 2021-07-10 VITALS — BP 120/68 | HR 68 | Ht <= 58 in | Wt 129.0 lb

## 2021-07-10 DIAGNOSIS — R0609 Other forms of dyspnea: Secondary | ICD-10-CM | POA: Diagnosis not present

## 2021-07-10 DIAGNOSIS — I2510 Atherosclerotic heart disease of native coronary artery without angina pectoris: Secondary | ICD-10-CM

## 2021-07-10 DIAGNOSIS — E785 Hyperlipidemia, unspecified: Secondary | ICD-10-CM

## 2021-07-10 MED ORDER — NITROGLYCERIN 0.4 MG SL SUBL: SUBLINGUAL_TABLET | SUBLINGUAL | 3 refills | Status: DC

## 2021-07-10 NOTE — Patient Instructions (Signed)
Medication Instructions:  ?Your Physician recommend you continue on your current medication as directed.   ? ?*If you need a refill on your cardiac medications before your next appointment, please call your pharmacy* ? ? ?Follow-Up: ?At [facility], you and your health needs are our priority.  As part of our continuing mission to provide you with exceptional heart care, we have created designated Provider Care Teams.  These Care Teams include your primary Cardiologist (physician) and Advanced Practice Providers (APPs -  Physician Assistants and Nurse Practitioners) who all work together to provide you with the care you need, when you need it. ? ?We recommend signing up for the patient portal called "MyChart".  Sign up information is provided on this After Visit Summary.  MyChart is used to connect with patients for Virtual Visits (Telemedicine).  Patients are able to view lab/test results, encounter notes, upcoming appointments, etc.  Non-urgent messages can be sent to your provider as well.   ?To learn more about what you can do with MyChart, go to [email or web address].   ? ?Your next appointment:   ?12 month(s) ? ?The format for your next appointment:   ?In Person ? ?Provider:   ?Kirk Ruths, MD  ? ? ?

## 2021-07-10 NOTE — Progress Notes (Signed)
? ? ?Office Visit  ?  ?Patient Name: Leslie Roberson ?Date of Encounter: 07/10/2021 ? ?Primary Care Provider:  Donella Stade, PA-C ?Primary Cardiologist:  Kirk Ruths, MD ? ?Chief Complaint  ?  ?66 year old female with a history of CAD s/p DES x2-LAD, DES-RCA in 2019, hyperlipidemia, prediabetes, hypothyroidism, GERD, anxiety, and depression who presents for follow-up related to CAD. ? ?Past Medical History  ?  ?Past Medical History:  ?Diagnosis Date  ? Anxiety   ? Arthritis   ? "right little finger; base of thumb left hand; spine" (07/07/2017)  ? Asthma, chronic 11/27/2015  ? Chronic back pain   ? Coronary artery disease   ? 4/19 PCI/DEx2 to LAD, and PCI/DES x1 to RCA, normal EF  ? Depression   ? Diverticulitis of colon 12/21/2015  ? Colonoscopy every 5 years/digestive health.   ? GERD (gastroesophageal reflux disease) 11/27/2015  ? History of blood transfusion   ? "related to spinal fusions"  ? Hypothyroidism   ? Idiopathic scoliosis 01/22/2016  ? Osteoporosis 11/27/2015  ? On prolia.   ? Pneumonia 2016  ? Pneumothorax 06/26/2017  ? Pre-diabetes   ? Thyroid disease   ? ?Past Surgical History:  ?Procedure Laterality Date  ? ABDOMINAL HYSTERECTOMY    ? bladder mesh    ? S/P bladder sling"  ? CORONARY STENT INTERVENTION N/A 07/07/2017  ? Procedure: CORONARY STENT INTERVENTION;  Surgeon: Leonie Man, MD;  Location: [facility];  Service: Cardiovascular;  Laterality: N/A;  ? ELBOW SURGERY Right   ? "dislocation"  ? INCONTINENCE SURGERY    ? "didn't work"  ? INTRAVASCULAR PRESSURE WIRE/FFR STUDY N/A 07/07/2017  ? Procedure: INTRAVASCULAR PRESSURE WIRE/FFR STUDY;  Surgeon: Leonie Man, MD;  Location: [facility];  Service: Cardiovascular;  Laterality: N/A;  ? JOINT REPLACEMENT    ? LEFT HEART CATH AND CORONARY ANGIOGRAPHY N/A 07/07/2017  ? Procedure: LEFT HEART CATH AND CORONARY ANGIOGRAPHY;  Surgeon: Leonie Man, MD;  Location: [facility];  Service: Cardiovascular;  Laterality: N/A;  ?  SPINAL FUSION  ~ 1970 X 3  ? "below neck - lower back"  ? TONSILLECTOMY    ? TOTAL HIP ARTHROPLASTY Left   ? ? ?Allergies ? ?Allergies  ?Allergen Reactions  ? Hydromorphone Rash  ? Levofloxacin Other (See Comments)  ?  Hallucinations  ? Meperidine Nausea And Vomiting and Other (See Comments)  ?  Also "doesn't work well for my pain"  ? Broccoli [Brassica Oleracea] Nausea Only and Other (See Comments)  ?  Causes stomach pain also  ? Adhesive [Tape] Rash  ? ? ?History of Present Illness  ?  ?66 year old female with the above past medical history including CAD s/p DES x2-LAD, DES-RCA in 2019, hyperlipidemia, prediabetes, hypothyroidism, GERD, anxiety, and depression. ? ?She was initially evaluated in 2019 with chest pain.  Cardiac catheterization was scheduled, but patient separately presented with pneumothorax.  She ultimately underwent cardiac catheterization in April 2019 which showed m-dRCA 99%-0% s/p DES, oLAD 30%, mLAD 30%, pLAD-1 75%-0% s/p DES, pLAD-2 60%-0% s/p DES, EF 55-65%.  She was last in the office on 06/28/2020 and was stable overall from a cardiac standpoint, though she did report some mild dyspnea on exertion, mild orthopnea.  She denied chest pain.  Echocardiogram in May 2022 showed EF 60 to 65%, G1 DD, no RWMA, no significant valvular abnormalities. ? ?She presents today for follow-up.  Since her last visit he has done well from a cardiac standpoint.  She denies any symptoms concerning for angina, denies dyspnea.  She thinks that her prior dyspnea on exertion, orthopnea, was related to physical discomfort in the setting of severe scoliosis which made it painful to take a deep breath, particularly when lying down. Overall, she reports feeling well and denies any new concerns today. ? ?Home Medications  ?  ?Current Outpatient Medications  ?Medication Sig Dispense Refill  ? albuterol (PROVENTIL HFA;VENTOLIN HFA) 108 (90 Base) MCG/ACT inhaler Inhale 1-2 puffs into the lungs every 4 (four) hours as needed  for wheezing or shortness of breath. 1 Inhaler 3  ? aspirin EC 81 MG tablet Take 81 mg by mouth at bedtime.    ? atorvastatin (LIPITOR) 20 MG tablet Take 1 tablet (20 mg total) by mouth daily. 90 tablet 3  ? busPIRone (BUSPAR) 15 MG tablet Take 1 tablet (15 mg total) by mouth daily. 30 tablet 0  ? calcium-vitamin D (OSCAL WITH D) 500-200 MG-UNIT tablet Take 1 tablet by mouth daily.    ? clonazePAM (KLONOPIN) 0.5 MG tablet Take 0.25 mg by mouth at bedtime.     ? denosumab (PROLIA) 60 MG/ML SOLN injection Inject 60 mg into the skin every 6 (six) months. Administer in upper arm, thigh, or abdomen    ? desvenlafaxine (PRISTIQ) 100 MG 24 hr tablet Take 100 mg by mouth at bedtime 30 tablet 0  ? diclofenac sodium (VOLTAREN) 1 % GEL APPLY 4G TOPICALLY 4 TIMES DAILY 100 g 1  ? gabapentin (NEURONTIN) 300 MG capsule TAKE 1 CAPSULE BY MOUTH IN THE MORNING AND 2 CAPSULES AT BEDTIME (Patient taking differently: Take 600 mg by mouth at bedtime.) 270 capsule 0  ? levothyroxine (SYNTHROID) 137 MCG tablet Take 1/2 tablet 6 days a week, 1 tablet 1 day per week 48 tablet 1  ? lidocaine (LIDODERM) 5 % Place 1 patch onto the skin daily. Remove & Discard patch within 12 hours or as directed by MD 30 patch 12  ? Menthol, Topical Analgesic, (MINERAL ICE EX) Apply 1 application to affected sites one to two times a day as needed (for pain)    ? metFORMIN (GLUCOPHAGE-XR) 500 MG 24 hr tablet TAKE 1 TABLET BY MOUTH EVERY DAY WITH BREAKFAST 90 tablet 0  ? metoprolol succinate (TOPROL-XL) 25 MG 24 hr tablet TAKE 1 TABLET (25 MG TOTAL) BY MOUTH DAILY. 90 tablet 3  ? nitroGLYCERIN (NITROSTAT) 0.4 MG SL tablet For chest pain, place one tablet under tongue. May repeat every five minutes for no more than three tablets (total of 15 minutes). If no chest pain relief, call [phone]/EMS. 25 tablet 3  ? Omega-3 Fatty Acids (FISH OIL PO) Take 1 capsule by mouth daily.     ? oxybutynin (DITROPAN-XL) 5 MG 24 hr tablet Take 1 tablet (5 mg total) by mouth daily. 90  tablet 1  ? pantoprazole (PROTONIX) 40 MG tablet Take 1 tablet (40 mg total) by mouth daily. 90 tablet 2  ? REXULTI 1 MG TABS Take 1 tablet by mouth daily.     ? traZODone (DESYREL) 150 MG tablet Take 150 mg by mouth at bedtime.    ? ?No current facility-administered medications for this visit.  ?  ? ?Review of Systems  ?  ?She denies chest pain, palpitations, dyspnea, pnd, orthopnea, n, v, dizziness, syncope, edema, weight gain, or early satiety. All other systems reviewed and are otherwise negative except as noted above.  ? ?Physical Exam  ?  ?VS:  BP 120/68   Pulse 68  Ht '4\' 10"'$  (1.473 m)   Wt 129 lb (58.5 kg)   SpO2 97%   BMI 26.96 kg/m?  ?GEN: Well nourished, well developed, in no acute distress. ?HEENT: normal. ?Neck: Supple, no JVD, carotid bruits, or masses. ?Cardiac: RRR, no murmurs, rubs, or gallops. No clubbing, cyanosis, edema.  Radials/DP/PT 2+ and equal bilaterally.  ?Respiratory:  Respirations regular and unlabored, clear to auscultation bilaterally. ?GI: Soft, nontender, nondistended, BS + x 4. ?MS: no deformity or atrophy. ?Skin: warm and dry, no rash. ?Neuro:  Strength and sensation are intact. ?Psych: Normal affect. ? ?Accessory Clinical Findings  ?  ?ECG personally reviewed by me today -NSR, 68 bpm- no acute changes. ? ?Lab Results  ?Component Value Date  ? WBC 6.3 05/14/2021  ? HGB 14.4 05/14/2021  ? HCT 43.6 05/14/2021  ? MCV 90.3 05/14/2021  ? PLT 232 05/14/2021  ? ?Lab Results  ?Component Value Date  ? CREATININE 0.99 05/14/2021  ? BUN 22 05/14/2021  ? NA 142 05/14/2021  ? K 4.5 05/14/2021  ? CL 105 05/14/2021  ? CO2 27 05/14/2021  ? ?Lab Results  ?Component Value Date  ? ALT 21 05/14/2021  ? AST 17 05/14/2021  ? ALKPHOS 73 12/09/2017  ? BILITOT 0.3 05/14/2021  ? ?Lab Results  ?Component Value Date  ? CHOL 135 05/08/2020  ? HDL 80 05/08/2020  ? [place] 41 05/08/2020  ? TRIG 68 05/08/2020  ? CHOLHDL 1.7 05/08/2020  ?  ?Lab Results  ?Component Value Date  ? HGBA1C 6.0 (A) 05/14/2021   ? ? ?Assessment & Plan  ? ?1. CAD: S/p DES-mRCA, DES-pLAD x2 in 2019. Stable with no anginal symptoms. No indication for ischemic evaluation. Continue aspirin, metoprolol, Lipitor. ? ?2. Dyspnea: Echo in May 20

## 2021-07-15 DIAGNOSIS — N3281 Overactive bladder: Secondary | ICD-10-CM | POA: Diagnosis not present

## 2021-07-16 DIAGNOSIS — M461 Sacroiliitis, not elsewhere classified: Secondary | ICD-10-CM | POA: Diagnosis not present

## 2021-07-19 ENCOUNTER — Other Ambulatory Visit: Payer: Self-pay | Admitting: Physician Assistant

## 2021-07-19 DIAGNOSIS — E039 Hypothyroidism, unspecified: Secondary | ICD-10-CM

## 2021-07-22 ENCOUNTER — Other Ambulatory Visit: Payer: Self-pay | Admitting: Physician Assistant

## 2021-07-22 DIAGNOSIS — E118 Type 2 diabetes mellitus with unspecified complications: Secondary | ICD-10-CM

## 2021-08-07 DIAGNOSIS — M461 Sacroiliitis, not elsewhere classified: Secondary | ICD-10-CM | POA: Diagnosis not present

## 2021-08-07 DIAGNOSIS — M5416 Radiculopathy, lumbar region: Secondary | ICD-10-CM | POA: Diagnosis not present

## 2021-09-09 DIAGNOSIS — M461 Sacroiliitis, not elsewhere classified: Secondary | ICD-10-CM | POA: Diagnosis not present

## 2021-09-30 ENCOUNTER — Ambulatory Visit (INDEPENDENT_AMBULATORY_CARE_PROVIDER_SITE_OTHER): Payer: Medicare HMO | Admitting: Family Medicine

## 2021-09-30 VITALS — BP 104/69 | HR 80 | Ht <= 58 in | Wt 128.0 lb

## 2021-09-30 DIAGNOSIS — Z0000 Encounter for general adult medical examination without abnormal findings: Secondary | ICD-10-CM | POA: Diagnosis not present

## 2021-09-30 NOTE — Patient Instructions (Addendum)
[facility] Maintenance Summary and Written Plan of Care  Leslie Roberson ,  Thank you for allowing me to perform your Medicare Annual Wellness Visit and for your ongoing commitment to your health.   Health Maintenance & Immunization History Health Maintenance  Topic Date Due  . URINE MICROALBUMIN  11/15/2021 (Originally 11/14/2021)  . INFLUENZA VACCINE  10/29/2021  . HEMOGLOBIN A1C  11/11/2021  . FOOT EXAM  11/14/2021  . OPHTHALMOLOGY EXAM  03/20/2022  . DEXA SCAN  04/04/2022  . MAMMOGRAM  04/11/2023  . COLONOSCOPY (Pts 45-41yr Insurance coverage will need to be confirmed)  12/29/2025  . TETANUS/TDAP  01/29/2026  . Pneumonia Vaccine 66 Years old  Completed  . COVID-19 Vaccine  Completed  . Hepatitis C Screening  Completed  . Zoster Vaccines- Shingrix  Completed  . HPV VACCINES  Aged Out   Immunization History  Administered Date(s) Administered  . Influenza Inj Mdck Quad Pf 11/22/2017  . Influenza,inj,Quad PF,6+ Mos 11/27/2015, 12/15/2016, 12/09/2018, 12/16/2019  . Influenza-Unspecified 12/28/2020  . Moderna Sars-Covid-2 Vaccination 02/15/2020, 09/22/2020  . PFIZER(Purple Top)SARS-COV-2 Vaccination 06/16/2019, 07/11/2019  . PNEUMOCOCCAL CONJUGATE-20 11/14/2020  . PPD Test 09/22/2012  . PPension scheme manager174yr& up 12/28/2020  . Pneumococcal Polysaccharide-23 07/21/2009  . Tdap 01/30/2016  . Zoster Recombinat (Shingrix) 07/29/2016, 11/16/2016  . Zoster, Live 02/04/2016    These are the patient goals that we discussed:  Goals Addressed              This Visit's Progress   .  Patient Stated (pt-stated)        09/30/2021 AWV Goal: Diabetes Management  Patient will maintain an A1C level below 6.5 Patient will not develop any diabetic foot complications Patient will not experience any hypoglycemic episodes over the next 3 months Patient will notify our office of any CBG readings outside of the provider recommended range  by calling [phone] will adhere to provider recommendations for diabetes management  Patient Self Management Activities take all medications as prescribed and report any negative side effects monitor and record blood sugar readings as directed adhere to a low carbohydrate diet that incorporates lean proteins, vegetables, whole grains, low glycemic fruits check feet daily noting any sores, cracks, injuries, or callous formations see PCP or podiatrist if she notices any changes in her legs, feet, or toenails Patient will visit PCP and have an A1C level checked every 3 to 6 months as directed  have a yearly eye exam to monitor for vascular changes associated with diabetes and will request that the report be sent to her pcp.  consult with her PCP regarding any changes in her health or new or worsening symptoms         This is a list of Health Maintenance Items that are overdue or due now: There are no preventive care reminders to display for this patient.   Orders/Referrals Placed Today: No orders of the defined types were placed in this encounter.  (Contact our referral department at [phone] you have not spoken with someone about your referral appointment within the next 5 days)    Follow-up Plan Follow-up with BrDonella StadePA-C as planned Medicare wellness visit in one year.  AVS printed and given to the patient.      Health Maintenance, Female Adopting a healthy lifestyle and getting preventive care are important in promoting health and wellness. Ask your health care provider about: The right schedule for you to have regular tests  and exams. Things you can do on your own to prevent diseases and keep yourself healthy. What should I know about diet, weight, and exercise? Eat a healthy diet  Eat a diet that includes plenty of vegetables, fruits, low-fat dairy products, and lean protein. Do not eat a lot of foods that are high in solid fats, added sugars,  or sodium. Maintain a healthy weight Body mass index (BMI) is used to identify weight problems. It estimates body fat based on height and weight. Your health care provider can help determine your BMI and help you achieve or maintain a healthy weight. Get regular exercise Get regular exercise. This is one of the most important things you can do for your health. Most adults should: Exercise for at least 150 minutes each week. The exercise should increase your heart rate and make you sweat (moderate-intensity exercise). Do strengthening exercises at least twice a week. This is in addition to the moderate-intensity exercise. Spend less time sitting. Even light physical activity can be beneficial. Watch cholesterol and blood lipids Have your blood tested for lipids and cholesterol at 66 years of age, then have this test every 5 years. Have your cholesterol levels checked more often if: Your lipid or cholesterol levels are high. You are older than 66 years of age. You are at high risk for heart disease. What should I know about cancer screening? Depending on your health history and family history, you may need to have cancer screening at various ages. This may include screening for: Breast cancer. Cervical cancer. Colorectal cancer. Skin cancer. Lung cancer. What should I know about heart disease, diabetes, and high blood pressure? Blood pressure and heart disease High blood pressure causes heart disease and increases the risk of stroke. This is more likely to develop in people who have high blood pressure readings or are overweight. Have your blood pressure checked: Every 3-5 years if you are 39-41 years of age. Every year if you are 10 years old or older. Diabetes Have regular diabetes screenings. This checks your fasting blood sugar level. Have the screening done: Once every three years after age 17 if you are at a normal weight and have a low risk for diabetes. More often and at a younger  age if you are overweight or have a high risk for diabetes. What should I know about preventing infection? Hepatitis B If you have a higher risk for hepatitis B, you should be screened for this virus. Talk with your health care provider to find out if you are at risk for hepatitis B infection. Hepatitis C Testing is recommended for: Everyone born from [birth year] through 1965. Anyone with known risk factors for hepatitis C. Sexually transmitted infections (STIs) Get screened for STIs, including gonorrhea and chlamydia, if: You are sexually active and are younger than 66 years of age. You are older than 66 years of age and your health care provider tells you that you are at risk for this type of infection. Your sexual activity has changed since you were last screened, and you are at increased risk for chlamydia or gonorrhea. Ask your health care provider if you are at risk. Ask your health care provider about whether you are at high risk for HIV. Your health care provider may recommend a prescription medicine to help prevent HIV infection. If you choose to take medicine to prevent HIV, you should first get tested for HIV. You should then be tested every 3 months for as long as you are taking  the medicine. Pregnancy If you are about to stop having your period (premenopausal) and you may become pregnant, seek counseling before you get pregnant. Take 400 to 800 micrograms (mcg) of folic acid every day if you become pregnant. Ask for birth control (contraception) if you want to prevent pregnancy. Osteoporosis and menopause Osteoporosis is a disease in which the bones lose minerals and strength with aging. This can result in bone fractures. If you are 58 years old or older, or if you are at risk for osteoporosis and fractures, ask your health care provider if you should: Be screened for bone loss. Take a calcium or vitamin D supplement to lower your risk of fractures. Be given hormone replacement therapy  (HRT) to treat symptoms of menopause. Follow these instructions at home: Alcohol use Do not drink alcohol if: Your health care provider tells you not to drink. You are pregnant, may be pregnant, or are planning to become pregnant. If you drink alcohol: Limit how much you have to: 0-1 drink a day. Know how much alcohol is in your drink. In the [place] one drink equals one 12 oz bottle of beer (355 mL), one 5 oz glass of wine (148 mL), or one 1 oz glass of hard liquor (44 mL). Lifestyle Do not use any products that contain nicotine or tobacco. These products include cigarettes, chewing tobacco, and vaping devices, such as e-cigarettes. If you need help quitting, ask your health care provider. Do not use street drugs. Do not share needles. Ask your health care provider for help if you need support or information about quitting drugs. General instructions Schedule regular health, dental, and eye exams. Stay current with your vaccines. Tell your health care provider if: You often feel depressed. You have ever been abused or do not feel safe at home. Summary Adopting a healthy lifestyle and getting preventive care are important in promoting health and wellness. Follow your health care provider's instructions about healthy diet, exercising, and getting tested or screened for diseases. Follow your health care provider's instructions on monitoring your cholesterol and blood pressure. This information is not intended to replace advice given to you by your health care provider. Make sure you discuss any questions you have with your health care provider. Document Revised: 08/06/2020 Document Reviewed: 08/06/2020 Elsevier Patient Education  [employer].

## 2021-09-30 NOTE — Progress Notes (Addendum)
MEDICARE ANNUAL WELLNESS VISIT  09/30/2021  Subjective:  Leslie Roberson is a 66 y.o. female patient of Donella Stade, PA-C who had a [employer] Visit today. Donella is Retired and lives with their spouse. she has 1 child. she reports that she is socially active and does interact with friends/family regularly. she is minimally physically active and enjoys playing games on the computer.  Patient Care Team: Lavada Mesi as PCP - General (Family Medicine) Lelon Perla, MD as PCP - Cardiology (Cardiology)     09/30/2021    2:02 PM 01/04/2019    8:03 AM 07/07/2017    2:13 PM 07/07/2017    6:39 AM 06/27/2017   12:00 AM 06/26/2017    1:48 PM 06/17/2017    9:40 AM  Advanced Directives  Does Patient Have a Medical Advance Directive? Yes Yes  Yes Yes Yes Yes  Type of Advance Directive Living will [place];Living will  [place];Living will  Living will;Healthcare Power of [place];Living will  Does patient want to make changes to medical advance directive? No - Patient declined No - Patient declined  No - Patient declined  No - Patient declined   Copy of [place] in Chart?   No - copy requested No - copy requested  No - copy requested No - copy requested    Hospital Utilization Over the Past 12 Months: # of hospitalizations or ER visits: 1 # of surgeries: 0  Review of Systems    Patient reports that her overall health is worse when compared to last year.  Review of Systems: History obtained from chart review and the patient  All other systems negative.  Pain Assessment Pain : 0-10 Pain Score: 7  Pain Type: Chronic pain Pain Location: Other (Comment) (Sciatica) Pain Descriptors / Indicators: Discomfort, Aching Pain Onset: More than a month ago Pain Frequency: Intermittent Pain Relieving Factors: none  Pain Relieving Factors: none  Current Medications & Allergies  (verified) Allergies as of 09/30/2021       Reactions   Hydromorphone Rash   Levofloxacin Other (See Comments)   Hallucinations   Meperidine Nausea And Vomiting, Other (See Comments)   Also "doesn't work well for my pain"   Broccoli [brassica Oleracea] Nausea Only, Other (See Comments)   Causes stomach pain also   Adhesive [tape] Rash        Medication List        Accurate as of September 30, 2021  2:16 PM. If you have any questions, ask your nurse or doctor.          albuterol 108 (90 Base) MCG/ACT inhaler Commonly known as: VENTOLIN HFA Inhale 1-2 puffs into the lungs every 4 (four) hours as needed for wheezing or shortness of breath.   aspirin EC 81 MG tablet Take 81 mg by mouth at bedtime.   atorvastatin 20 MG tablet Commonly known as: LIPITOR Take 1 tablet (20 mg total) by mouth daily.   busPIRone 15 MG tablet Commonly known as: BUSPAR Take 1 tablet (15 mg total) by mouth daily.   calcium-vitamin D 500-200 MG-UNIT tablet Commonly known as: OSCAL WITH D Take 1 tablet by mouth daily.   clobetasol 0.05 % external solution Commonly known as: TEMOVATE Apply topically 2 (two) times daily.   clonazePAM 0.5 MG tablet Commonly known as: KLONOPIN Take 0.25 mg by mouth at bedtime.   denosumab 60 MG/ML Soln injection Commonly known  as: PROLIA Inject 60 mg into the skin every 6 (six) months. Administer in upper arm, thigh, or abdomen   desvenlafaxine 100 MG 24 hr tablet Commonly known as: PRISTIQ Take 100 mg by mouth at bedtime   diclofenac sodium 1 % Gel Commonly known as: VOLTAREN APPLY 4G TOPICALLY 4 TIMES DAILY   FISH OIL PO Take 1 capsule by mouth daily.   gabapentin 300 MG capsule Commonly known as: NEURONTIN TAKE 1 CAPSULE BY MOUTH IN THE MORNING AND 2 CAPSULES AT BEDTIME What changed:  how much to take how to take this when to take this additional instructions   ketoconazole 2 % shampoo Commonly known as: NIZORAL Apply topically.    levothyroxine 137 MCG tablet Commonly known as: SYNTHROID TAKE 1 TABLET (137 MCG TOTAL) BY MOUTH 2 (TWO) TIMES A WEEK.   lidocaine 5 % Commonly known as: Lidoderm Place 1 patch onto the skin daily. Remove & Discard patch within 12 hours or as directed by MD   metFORMIN 500 MG 24 hr tablet Commonly known as: GLUCOPHAGE-XR TAKE 1 TABLET EVERY DAY WITH BREAKFAST   metoprolol succinate 25 MG 24 hr tablet Commonly known as: TOPROL-XL TAKE 1 TABLET (25 MG TOTAL) BY MOUTH DAILY.   MINERAL ICE EX Apply 1 application to affected sites one to two times a day as needed (for pain)   nitroGLYCERIN 0.4 MG SL tablet Commonly known as: NITROSTAT For chest pain, place one tablet under tongue. May repeat every five minutes for no more than three tablets (total of 15 minutes). If no chest pain relief, call [phone]/EMS.   nitroGLYCERIN 0.4 MG SL tablet Commonly known as: NITROSTAT Place under the tongue.   oxybutynin 5 MG 24 hr tablet Commonly known as: DITROPAN-XL Take 1 tablet (5 mg total) by mouth daily.   pantoprazole 40 MG tablet Commonly known as: PROTONIX Take 1 tablet (40 mg total) by mouth daily.   Rexulti 1 MG Tabs tablet Generic drug: brexpiprazole Take 1 tablet by mouth daily.   traZODone 150 MG tablet Commonly known as: DESYREL Take 150 mg by mouth at bedtime.        History (reviewed): Past Medical History:  Diagnosis Date   Allergy 1992   Anxiety    Arthritis    "right little finger; base of thumb left hand; spine" (07/07/2017)   Asthma, chronic 11/27/2015   Cataract 2021   Chronic back pain    Coronary artery disease    4/19 PCI/DEx2 to LAD, and PCI/DES x1 to RCA, normal EF   Depression    Diabetes mellitus without complication ([place]) 3474   Diverticulitis of colon 12/21/2015   Colonoscopy every 5 years/digestive health.    GERD (gastroesophageal reflux disease) 11/27/2015   History of blood transfusion    "related to spinal fusions"   Hypothyroidism     Idiopathic scoliosis 01/22/2016   Osteoporosis 11/27/2015   On prolia.    Pneumonia 2016   Pneumothorax 06/26/2017   Pre-diabetes    Sleep apnea 2020   Thyroid disease    Past Surgical History:  Procedure Laterality Date   ABDOMINAL HYSTERECTOMY     bladder mesh     S/P bladder sling"   COLON SURGERY  2007   CORONARY STENT INTERVENTION N/A 07/07/2017   Procedure: CORONARY STENT INTERVENTION;  Surgeon: Leonie Man, MD;  Location: [facility];  Service: Cardiovascular;  Laterality: N/A;   ELBOW SURGERY Right    "dislocation"   INCONTINENCE SURGERY     "  didn't work"   INTRAVASCULAR PRESSURE WIRE/FFR STUDY N/A 07/07/2017   Procedure: INTRAVASCULAR PRESSURE WIRE/FFR STUDY;  Surgeon: Leonie Man, MD;  Location: [facility];  Service: Cardiovascular;  Laterality: N/A;   JOINT REPLACEMENT     LEFT HEART CATH AND CORONARY ANGIOGRAPHY N/A 07/07/2017   Procedure: LEFT HEART CATH AND CORONARY ANGIOGRAPHY;  Surgeon: Leonie Man, MD;  Location: [facility];  Service: Cardiovascular;  Laterality: N/A;   SPINAL FUSION  ~ 1970 X 3   "below neck - lower back"   TONSILLECTOMY     TOTAL HIP ARTHROPLASTY Left    Family History  Problem Relation Age of Onset   Cancer Mother        lung   Depression Mother    Early death Mother    Heart attack Father    Hyperlipidemia Father    Hypertension Father    Arthritis Father    Heart disease Father    Diabetes Maternal Aunt    Hyperlipidemia Paternal Aunt    COPD Paternal Aunt    Hypertension Paternal Aunt    Stroke Paternal Aunt    Social History   Socioeconomic History   Marital status: Married    Spouse name: Barnabas Lister   Number of children: 1   Years of education: 16   Highest education level: Bachelor's degree (e.g., BA, AB, BS)  Occupational History   Occupation: Retired  Tobacco Use   Smoking status: Never   Smokeless tobacco: Never  Vaping Use   Vaping Use: Never used  Substance and Sexual  Activity   Alcohol use: Not Currently    Comment: Maybe 3 drinks/year   Drug use: No   Sexual activity: Yes    Birth control/protection: None  Other Topics Concern   Not on file  Social History Narrative   Lives with her husband. She has one child. She enjoys playing games on the computer.   Social Determinants of Health   Financial Resource Strain: Low Risk  (09/30/2021)   Overall Financial Resource Strain (CARDIA)    Difficulty of Paying Living Expenses: Not hard at all  Food Insecurity: No Food Insecurity (09/30/2021)   Hunger Vital Sign    Worried About Running Out of Food in the Last Year: Never true    Ran Out of Food in the Last Year: Never true  Transportation Needs: No Transportation Needs (09/30/2021)   PRAPARE - Hydrologist (Medical): No    Lack of Transportation (Non-Medical): No  Physical Activity: Inactive (09/30/2021)   Exercise Vital Sign    Days of Exercise per Week: 0 days    Minutes of Exercise per Session: 0 min  Stress: No Stress Concern Present (09/30/2021)   [place]    Feeling of Stress : Not at all  Social Connections: Moderately Integrated (09/30/2021)   Social Connection and Isolation Panel [NHANES]    Frequency of Communication with Friends and Family: Twice a week    Frequency of Social Gatherings with Friends and Family: Never    Attends Religious Services: More than 4 times per year    Active Member of Clubs or Organizations: Yes    Attends Archivist Meetings: 1 to 4 times per year    Marital Status: Married    Activities of Daily Living    09/30/2021    2:07 PM 09/29/2021    6:28 PM  In your present state  of health, do you have any difficulty performing the following activities:  Hearing?  0  Vision?  0  Difficulty concentrating or making decisions?  0  Walking or climbing stairs?  0  Dressing or bathing?  0  Doing errands, shopping?  0   Preparing Food and eating ?  N  Using the Toilet?  N  In the past six months, have you accidently leaked urine? Y Y  Comment all the time if she doesn't take her medication.   Do you have problems with loss of bowel control?  N  Managing your Medications?  N  Managing your Finances?  N  Housekeeping or managing your Housekeeping? Y Y  Comment due to her sciatica pain.     Patient Education/Literacy How often do you need to have someone help you when you read instructions, pamphlets, or other written materials from your doctor or pharmacy?: 1 - Never What is the last grade level you completed in school?: Bachelor's degree  Exercise Current Exercise Habits: The patient does not participate in regular exercise at present, Exercise limited by: orthopedic condition(s)  Diet Patient reports consuming 2 meals a day and 1 snack(s) a day Patient reports that her primary diet is: Regular Patient reports that she does have regular access to food.   Depression Screen    09/30/2021    2:03 PM 05/14/2021    2:08 PM 11/14/2020    2:26 PM 02/15/2020    2:15 PM 07/18/2019    2:27 PM 02/07/2019    3:48 PM 02/10/2018    3:05 PM  PHQ 2/9 Scores  PHQ - 2 Score 0 '2 1 2 '$ 0 1 2  PHQ- 9 Score  '3 5  4 3 6     '$ Fall Risk    09/29/2021    6:28 PM 05/14/2021    2:09 PM 11/14/2020    2:29 PM  Fall Risk   Falls in the past year? '1 1 1  '$ Number falls in past yr: 0 0 0  Injury with Fall? '1 1 1  '$ Risk for fall due to :  History of fall(s) History of fall(s)  Follow up  Falls evaluation completed Falls evaluation completed     Objective:   BP 104/69 (BP Location: Left Arm, Patient Position: Sitting, Cuff Size: Normal)   Pulse 80   Ht '4\' 10"'$  (1.473 m)   Wt 128 lb 0.6 oz (58.1 kg)   SpO2 95%   BMI 26.76 kg/m   Last Weight  Most recent update: 09/30/2021  1:58 PM    Weight  58.1 kg (128 lb 0.6 oz)             Body mass index is 26.76 kg/m.  Hearing/Vision  Leslie Roberson did not have difficulty with  hearing/understanding during the face-to-face interview Leslie Roberson did not have difficulty with her vision during the face-to-face interview Reports that she has had a formal eye exam by an eye care professional within the past year Reports that she has had a formal hearing evaluation within the past year  Cognitive Function:    09/30/2021    2:08 PM  6CIT Screen  What Year? 0 points  What month? 0 points  What time? 0 points  Count back from 20 0 points  Months in reverse 0 points  Repeat phrase 0 points  Total Score 0 points    Normal Cognitive Function Screening: Yes (Normal:0-7, Significant for Dysfunction: >8)  Immunization & Health Maintenance Record Immunization History  Administered Date(s) Administered   Influenza Inj Mdck Quad Pf 11/22/2017   Influenza,inj,Quad PF,6+ Mos 11/27/2015, 12/15/2016, 12/09/2018, 12/16/2019   Influenza-Unspecified 12/28/2020   Moderna Sars-Covid-2 Vaccination 02/15/2020, 09/22/2020   PFIZER(Purple Top)SARS-COV-2 Vaccination 06/16/2019, 07/11/2019   PNEUMOCOCCAL CONJUGATE-20 11/14/2020   PPD Test 09/22/2012   Pfizer Covid-19 Vaccine Bivalent Booster 1yr & up 12/28/2020   Pneumococcal Polysaccharide-23 07/21/2009   Tdap 01/30/2016   Zoster Recombinat (Shingrix) 07/29/2016, 11/16/2016   Zoster, Live 02/04/2016    Health Maintenance  Topic Date Due   URINE MICROALBUMIN  11/15/2021 (Originally 11/14/2021)   INFLUENZA VACCINE  10/29/2021   HEMOGLOBIN A1C  11/11/2021   FOOT EXAM  11/14/2021   OPHTHALMOLOGY EXAM  03/20/2022   DEXA SCAN  04/04/2022   MAMMOGRAM  04/11/2023   COLONOSCOPY (Pts 45-439yrInsurance coverage will need to be confirmed)  12/29/2025   TETANUS/TDAP  01/29/2026   Pneumonia Vaccine 6540Years old  Completed   COVID-19 Vaccine  Completed   Hepatitis C Screening  Completed   Zoster Vaccines- Shingrix  Completed   HPV VACCINES  Aged Out       Assessment  This is a routine wellness examination for Leslie Roberson.  Health Maintenance: Due or Overdue There are no preventive care reminders to display for this patient.   Leslie Roberson does not need a referral for [employer] Companyssistance: Care Management:   no Social Work:    no Prescription Assistance:  no Nutrition/Diabetes Education:  no   Plan:  Personalized Goals  Goals Addressed               This Visit's Progress     Patient Stated (pt-stated)        09/30/2021 AWV Goal: Diabetes Management  Patient will maintain an A1C level below 6.5 Patient will not develop any diabetic foot complications Patient will not experience any hypoglycemic episodes over the next 3 months Patient will notify our office of any CBG readings outside of the provider recommended range by calling [phone] will adhere to provider recommendations for diabetes management  Patient Self Management Activities take all medications as prescribed and report any negative side effects monitor and record blood sugar readings as directed adhere to a low carbohydrate diet that incorporates lean proteins, vegetables, whole grains, low glycemic fruits check feet daily noting any sores, cracks, injuries, or callous formations see PCP or podiatrist if she notices any changes in her legs, feet, or toenails Patient will visit PCP and have an A1C level checked every 3 to 6 months as directed  have a yearly eye exam to monitor for vascular changes associated with diabetes and will request that the report be sent to her pcp.  consult with her PCP regarding any changes in her health or new or worsening symptoms        Personalized Health Maintenance & Screening Recommendations  There are no preventive care reminders to display for this patient.  Lung Cancer Screening Recommended: no (Low Dose CT Chest recommended if Age 66-80ears, 30 pack-year currently smoking OR have quit w/in past 15 years) Hepatitis C Screening recommended: no HIV Screening  recommended: no  Advanced Directives: Written information was not given per the patient's request.  Referrals & Orders No orders of the defined types were placed in this encounter.   Follow-up Plan Follow-up with BrDonella StadePA-C as planned Medicare wellness visit in one year.  AVS printed and given to the patient.   I have personally reviewed  and noted the following in the patient's chart:   Medical and social history Use of alcohol, tobacco or illicit drugs  Current medications and supplements Functional ability and status Nutritional status Physical activity Advanced directives List of other physicians Hospitalizations, surgeries, and ER visits in previous 12 months Vitals Screenings to include cognitive, depression, and falls Referrals and appointments  In addition, I have reviewed and discussed with patient certain preventive protocols, quality metrics, and best practice recommendations. A written personalized care plan for preventive services as well as general preventive health recommendations were provided to patient.     Tinnie Gens, RN, BSN  09/30/2021

## 2021-10-07 DIAGNOSIS — M461 Sacroiliitis, not elsewhere classified: Secondary | ICD-10-CM | POA: Diagnosis not present

## 2021-10-07 DIAGNOSIS — M791 Myalgia, unspecified site: Secondary | ICD-10-CM | POA: Diagnosis not present

## 2021-10-07 DIAGNOSIS — M5416 Radiculopathy, lumbar region: Secondary | ICD-10-CM | POA: Diagnosis not present

## 2021-10-14 ENCOUNTER — Encounter: Payer: Self-pay | Admitting: Physician Assistant

## 2021-10-14 ENCOUNTER — Ambulatory Visit (INDEPENDENT_AMBULATORY_CARE_PROVIDER_SITE_OTHER): Payer: Medicare HMO | Admitting: Physician Assistant

## 2021-10-14 VITALS — BP 114/62 | HR 79 | Ht <= 58 in | Wt 127.0 lb

## 2021-10-14 DIAGNOSIS — Z9861 Coronary angioplasty status: Secondary | ICD-10-CM

## 2021-10-14 DIAGNOSIS — I209 Angina pectoris, unspecified: Secondary | ICD-10-CM

## 2021-10-14 DIAGNOSIS — E118 Type 2 diabetes mellitus with unspecified complications: Secondary | ICD-10-CM | POA: Diagnosis not present

## 2021-10-14 DIAGNOSIS — R0789 Other chest pain: Secondary | ICD-10-CM | POA: Diagnosis not present

## 2021-10-14 DIAGNOSIS — I2510 Atherosclerotic heart disease of native coronary artery without angina pectoris: Secondary | ICD-10-CM

## 2021-10-14 LAB — POCT GLYCOSYLATED HEMOGLOBIN (HGB A1C): HbA1c POC (<> result, manual entry): 6.6 % (ref 4.0–5.6)

## 2021-10-14 LAB — POCT UA - MICROALBUMIN
Albumin/Creatinine Ratio, Urine, POC: <30
Creatinine, POC: 300 mg/dL
Microalbumin Ur, POC: 30 mg/L

## 2021-10-14 NOTE — Progress Notes (Unsigned)
Established Patient Office Visit  Subjective   Patient ID: Leslie Roberson, female    DOB: [birth year]  Age: 66 y.o. MRN: [id number]  Chief Complaint  Patient presents with   Follow-up    HPI Pt is a 66 yo female with T2DM, CAD and s/p angioplasty, OSA, MDD, GAD who presents to the clinic for 3 month follow up.   Pt is only taking metformin for sugars. She is doing well but not checking sugars. She denies any hypoglycemic events. She admits she has been eating more desserts lately with many celebrations.   She has had one episode of CP while actually walking out of doctors office. It lasted for several minutes. She did not take nitroglycerin because she forgot to. It did resolve on its own and not had it again. Denies any increase in anxiety. No SOB.   Patient Active Problem List   Diagnosis Date Noted   Mass of right axilla 05/15/2021   Ear fullness, left 04/08/2021   Bilateral hearing loss 04/05/2021   Impacted cerumen, left ear 04/05/2021   Controlled type 2 diabetes mellitus with complication, without long-term current use of insulin ([place]) 11/16/2020   Right hand pain 11/16/2020   Fall 11/16/2020   Cough 05/18/2020   Paroxysmal nocturnal dyspnea 05/14/2020   Orthopnea 05/14/2020   Acute conjunctivitis of both eyes 01/23/2020   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   Ingrown right greater toenail 07/19/2019   Status post hip replacement, left 07/19/2019   Left hip pain 07/19/2019   OSA on CPAP 05/20/2019   Snoring 05/02/2019   Non-restorative sleep 05/02/2019   Migraine without aura and without status migrainosus, not intractable 02/08/2019   OAB (overactive bladder) 02/08/2019   Diabetes mellitus without complication ([place]) 00/86/7619   Hyperlipidemia LDL goal <70 11/29/2018   DJD (degenerative joint disease), lumbosacral 11/29/2018   Pre-operative clearance 11/29/2018   Nasal dryness 02/06/2018   Arthralgia 02/05/2018   Rhinorrhea 01/04/2018    Excessive sweating 09/22/2017   Tongue mass 08/14/2017   Angina, class III ([place]) 07/07/2017   S/P cardiac cath 07/07/2017   CAD S/P percutaneous coronary angioplasty    Pneumothorax 06/25/2017   Atypical chest pain 05/24/2017   Itchy scalp 05/24/2017   Right leg pain 02/06/2017   Chronic tension-type headache, intractable 02/05/2017   Prediabetes 02/05/2017   Lump in throat 01/27/2017   Dysphagia 01/27/2017   No energy 09/15/2016   Bad taste in mouth 07/26/2016   Paresthesia 04/03/2016   Facet hypertrophy of lumbosacral region 04/03/2016   Chronic back pain 04/03/2016   Right knee pain 02/19/2016   Rosacea 02/04/2016   Vaginal atrophy 01/31/2016   Idiopathic scoliosis 01/22/2016   Hip bursitis 12/25/2015   Biceps tendonitis on right 12/25/2015   Diverticulitis of colon 12/21/2015   GERD (gastroesophageal reflux disease) 11/27/2015   Osteoporosis 11/27/2015   GAD (generalized anxiety disorder) 11/27/2015   MDD (major depressive disorder), recurrent, in full remission ([place]) 11/27/2015   Chronic dryness of both eyes 11/27/2015   Acquired hypothyroidism 11/27/2015   Asthma, chronic 11/27/2015   Insomnia 11/27/2015   Seborrheic keratoses 11/27/2015   CAD in native artery 11/27/2015   Past Medical History:  Diagnosis Date   Allergy 1992   Anxiety    Arthritis    "right little finger; base of thumb left hand; spine" (07/07/2017)   Asthma, chronic 11/27/2015   Cataract 2021   Chronic back pain    Coronary artery disease    4/19 PCI/DEx2  to LAD, and PCI/DES x1 to RCA, normal EF   Depression    Diabetes mellitus without complication ([place]) 0347   Diverticulitis of colon 12/21/2015   Colonoscopy every 5 years/digestive health.    GERD (gastroesophageal reflux disease) 11/27/2015   History of blood transfusion    "related to spinal fusions"   Hypothyroidism    Idiopathic scoliosis 01/22/2016   Osteoporosis 11/27/2015   On prolia.    Pneumonia 2016   Pneumothorax 06/26/2017    Pre-diabetes    Sleep apnea 2020   Thyroid disease    Family History  Problem Relation Age of Onset   Cancer Mother        lung   Depression Mother    Early death Mother    Heart attack Father    Hyperlipidemia Father    Hypertension Father    Arthritis Father    Heart disease Father    Diabetes Maternal Aunt    Hyperlipidemia Paternal Aunt    COPD Paternal Aunt    Hypertension Paternal Aunt    Stroke Paternal Aunt    Allergies  Allergen Reactions   Hydromorphone Rash   Levofloxacin Other (See Comments)    Hallucinations   Meperidine Nausea And Vomiting and Other (See Comments)    Also "doesn't work well for my pain"   Broccoli [Brassica Oleracea] Nausea Only and Other (See Comments)    Causes stomach pain also   Adhesive [Tape] Rash      ROS See HPI.    Objective:     BP 114/62   Pulse 79   Ht '4\' 10"'$  (1.473 m)   Wt 127 lb (57.6 kg)   BMI 26.54 kg/m  BP Readings from Last 3 Encounters:  10/14/21 114/62  09/30/21 104/69  07/10/21 120/68   Wt Readings from Last 3 Encounters:  10/14/21 127 lb (57.6 kg)  09/30/21 128 lb 0.6 oz (58.1 kg)  07/10/21 129 lb (58.5 kg)      Physical Exam Vitals reviewed.  Constitutional:      Appearance: Normal appearance.  HENT:     Head: Normocephalic.  Cardiovascular:     Rate and Rhythm: Normal rate and regular rhythm.     Pulses: Normal pulses.     Heart sounds: Normal heart sounds.  Pulmonary:     Effort: Pulmonary effort is normal.     Breath sounds: Normal breath sounds.  Musculoskeletal:     Right lower leg: No edema.     Left lower leg: No edema.  Neurological:     General: No focal deficit present.     Mental Status: She is alert and oriented to person, place, and time.  Psychiatric:        Mood and Affect: Mood normal.        Results for orders placed or performed in visit on 10/14/21  POCT HgB A1C  Result Value Ref Range   Hemoglobin A1C     HbA1c POC (<> result, manual entry) 6.6 4.0 -  5.6 %   HbA1c, POC (prediabetic range)     HbA1c, POC (controlled diabetic range)    POCT UA - Microalbumin  Result Value Ref Range   Microalbumin Ur, POC 30 mg/L   Creatinine, POC 300 mg/dL   Albumin/Creatinine Ratio, Urine, POC <30      Assessment & Plan:  Marland KitchenMarland KitchenRhaelyn was seen today for follow-up.  Diagnoses and all orders for this visit:  Controlled type 2 diabetes mellitus with complication, without long-term current use  of insulin ([place]) -     POCT HgB A1C -     POCT UA - Microalbumin  CAD S/P percutaneous coronary angioplasty  Atypical chest pain  Angina, class III ([facility])   A1C is under 7 but elevated from last 3 months Continue metformin  BP to goal Negative microalbuminuria On statin Eye and foot exam UTD Vaccines UTD Follow up in 3 months  Concerned about episode of CP. Pt not able to complete exercise stress test due to MSK pain.  Pt needs to get in with cardiology to discuss angina.  Strongly encouraged patient to take nitro with any future CP.  Return in about 3 months (around 01/14/2022).    Iran Planas, PA-C

## 2021-10-17 ENCOUNTER — Encounter: Payer: Self-pay | Admitting: Physician Assistant

## 2021-10-25 NOTE — Telephone Encounter (Signed)
I think that is fine for now.  Worst case she goes to the ED if need be before then.

## 2021-10-29 ENCOUNTER — Encounter: Payer: Self-pay | Admitting: Physician Assistant

## 2021-10-29 DIAGNOSIS — S63621A Sprain of interphalangeal joint of right thumb, initial encounter: Secondary | ICD-10-CM | POA: Diagnosis not present

## 2021-10-29 MED ORDER — OXYBUTYNIN CHLORIDE ER 5 MG PO TB24: 5.0000 mg | ORAL_TABLET | Freq: Every day | ORAL | 1 refills | Status: DC

## 2021-10-31 ENCOUNTER — Ambulatory Visit (HOSPITAL_BASED_OUTPATIENT_CLINIC_OR_DEPARTMENT_OTHER): Payer: Medicare HMO | Admitting: Family

## 2021-11-04 DIAGNOSIS — F333 Major depressive disorder, recurrent, severe with psychotic symptoms: Secondary | ICD-10-CM | POA: Diagnosis not present

## 2021-11-05 ENCOUNTER — Ambulatory Visit (HOSPITAL_BASED_OUTPATIENT_CLINIC_OR_DEPARTMENT_OTHER): Payer: Medicare HMO | Admitting: Family

## 2021-11-05 ENCOUNTER — Encounter (HOSPITAL_BASED_OUTPATIENT_CLINIC_OR_DEPARTMENT_OTHER): Payer: Self-pay | Admitting: Family

## 2021-11-05 VITALS — BP 134/72 | HR 60 | Ht <= 58 in | Wt 129.2 lb

## 2021-11-05 DIAGNOSIS — E785 Hyperlipidemia, unspecified: Secondary | ICD-10-CM

## 2021-11-05 DIAGNOSIS — R079 Chest pain, unspecified: Secondary | ICD-10-CM | POA: Diagnosis not present

## 2021-11-05 DIAGNOSIS — I25118 Atherosclerotic heart disease of native coronary artery with other forms of angina pectoris: Secondary | ICD-10-CM | POA: Diagnosis not present

## 2021-11-05 NOTE — Patient Instructions (Signed)
Medication Instructions:  Your Physician recommend you continue on your current medication as directed.    *If you need a refill on your cardiac medications before your next appointment, please call your pharmacy*   Lab Work: Please return for Lab work within the month for Fasting Lipid Panel and CMP. We will send you a message if you can get done with PCP.  You may come to the...   [facility] (3rd floor) [address] [place], [place] [address]  Open: 8am-Noon and 1pm-4:30pm  Please ring the doorbell on the small table when you exit the elevator and the Lab Tech will come get you  [place] at [facility] [address] [place], [place], [place] [address] Open: 8am-1pm, then 2pm-4:30pm   [place]- Please see attached locations sheet stapled to your lab work with address and hours.   If you have labs (blood work) drawn today and your tests are completely normal, you will receive your results only by: [place] (if you have MyChart) OR A paper copy in the mail If you have any lab test that is abnormal or we need to change your treatment, we will call you to review the results.   Testing/Procedures: How to Prepare for Your Cardiac PET/CT Stress Test:  1. Please do not take these medications before your test:  Your medications may be taken with water.  2. Nothing to eat or drink, except water, 3 hours prior to arrival time.   NO caffeine/decaffeinated products, or chocolate 12 hours prior to arrival.  3. NO perfume, cologne or lotion  4. Total time is 1 to 2 hours; you may want to bring reading material for the waiting time.  5. Please report to Admitting at the [facility] Main Entrance 60 minutes early for your test.  [place], [place] [address]  Diabetic Preparation:  Hold oral medications. You may take NPH and Lantus insulin. Do not take Humalog or Humulin R (Regular Insulin) the day of your  test. Check blood sugars prior to leaving the house. If able to eat breakfast prior to 3 hour fasting, you may take all medications, including your insulin, Do not worry if you miss your breakfast dose of insulin - start at your next meal.  In preparation for your appointment, medication and supplies will be purchased.  Appointment availability is limited, so if you need to cancel or reschedule, please call the Radiology Department at [phone]  24 hours in advance to avoid a cancellation fee of $100.00  What to Expect After you Arrive:  Once you arrive and check in for your appointment, you will be taken to a preparation room within the Radiology Department.  A technologist or Nurse will obtain your medical history, verify that you are correctly prepped for the exam, and explain the procedure.  Afterwards,  an IV will be started in your arm and electrodes will be placed on your skin for EKG monitoring during the stress portion of the exam. Then you will be escorted to the PET/CT scanner.  There, staff will get you positioned on the scanner and obtain a blood pressure and EKG.  During the exam, you will continue to be connected to the EKG and blood pressure machines.  A small, safe amount of a radioactive tracer will be injected in your IV to obtain a series of pictures of your heart along with an injection of a stress agent.    After your Exam:  It is recommended that you eat a meal and drink a caffeinated beverage to counter act any effects of the stress agent.  Drink plenty of fluids for the remainder of the day and urinate frequently for the first couple of hours after the exam.  Your doctor will inform you of your test results within 7-10 business days.  For questions about your test or how to prepare for your test, please call: Marchia Bond, Cardiac Imaging Nurse Navigator  Gordy Clement, Cardiac Imaging Nurse Navigator Office: [phone]    Follow-Up: At [facility], you and  your health needs are our priority.  As part of our continuing mission to provide you with exceptional heart care, we have created designated Provider Care Teams.  These Care Teams include your primary Cardiologist (physician) and Advanced Practice Providers (APPs -  Physician Assistants and Nurse Practitioners) who all work together to provide you with the care you need, when you need it.  We recommend signing up for the patient portal called "MyChart".  Sign up information is provided on this After Visit Summary.  MyChart is used to connect with patients for Virtual Visits (Telemedicine).  Patients are able to view lab/test results, encounter notes, upcoming appointments, etc.  Non-urgent messages can be sent to your provider as well.   To learn more about what you can do with MyChart, go to [email or web address].    Your next appointment:   3 month(s)  The format for your next appointment:   In Person  Provider:   Kirk Ruths, MD  or Laurann Montana, NP{  Other Instructions Heart Healthy Diet Recommendations: A low-salt diet is recommended. Meats should be grilled, baked, or boiled. Avoid fried foods. Focus on lean protein sources like fish or chicken with vegetables and fruits. The American Heart Association is a Microbiologist!  American Heart Association Diet and Lifeystyle Recommendations   Exercise recommendations: The American Heart Association recommends 150 minutes of moderate intensity exercise weekly. Try 30 minutes of moderate intensity exercise 4-5 times per week. This could include walking, jogging, or swimming.   Important Information About Sugar

## 2021-11-05 NOTE — Progress Notes (Addendum)
Office Visit    Patient Name: Leslie Roberson Date of Encounter: 11/08/2021  PCP:  Breeback, Jade L, [place]  Cardiologist:  Kirk Ruths, MD  Advanced Practice Provider:  No care team member to display Electrophysiologist:  None      Chief Complaint    [name] is a 66 y.o. female presents today for chest pain  Past Medical History    Past Medical History:  Diagnosis Date   Allergy 1992   Anxiety    Arthritis    "right little finger; base of thumb left hand; spine" (07/07/2017)   Asthma, chronic 11/27/2015   Cataract 2021   Chronic back pain    Coronary artery disease    4/19 PCI/DEx2 to LAD, and PCI/DES x1 to RCA, normal EF   Depression    Diabetes mellitus without complication ([place]) 4854   Diverticulitis of colon 12/21/2015   Colonoscopy every 5 years/digestive health.    GERD (gastroesophageal reflux disease) 11/27/2015   History of blood transfusion    "related to spinal fusions"   Hypothyroidism    Idiopathic scoliosis 01/22/2016   Osteoporosis 11/27/2015   On prolia.    Pneumonia 2016   Pneumothorax 06/26/2017   Pre-diabetes    Sleep apnea 2020   Thyroid disease    Past Surgical History:  Procedure Laterality Date   ABDOMINAL HYSTERECTOMY     bladder mesh     S/P bladder sling"   COLON SURGERY  2007   CORONARY STENT INTERVENTION N/A 07/07/2017   Procedure: CORONARY STENT INTERVENTION;  Surgeon: Leonie Man, MD;  Location: [facility];  Service: Cardiovascular;  Laterality: N/A;   ELBOW SURGERY Right    "dislocation"   INCONTINENCE SURGERY     "didn't work"   INTRAVASCULAR PRESSURE WIRE/FFR STUDY N/A 07/07/2017   Procedure: INTRAVASCULAR PRESSURE WIRE/FFR STUDY;  Surgeon: Leonie Man, MD;  Location: [facility];  Service: Cardiovascular;  Laterality: N/A;   JOINT REPLACEMENT     LEFT HEART CATH AND CORONARY ANGIOGRAPHY N/A 07/07/2017   Procedure: LEFT HEART CATH AND CORONARY  ANGIOGRAPHY;  Surgeon: Leonie Man, MD;  Location: [facility];  Service: Cardiovascular;  Laterality: N/A;   SPINAL FUSION  ~ 1970 X 3   "below neck - lower back"   TONSILLECTOMY     TOTAL HIP ARTHROPLASTY Left     Allergies  Allergies  Allergen Reactions   Hydromorphone Rash   Levofloxacin Other (See Comments)    Hallucinations   Meperidine Nausea And Vomiting and Other (See Comments)    Also "doesn't work well for my pain"   Broccoli [Brassica Oleracea] Nausea Only and Other (See Comments)    Causes stomach pain also   Adhesive [Tape] Rash    History of Present Illness    Leslie Roberson is a 66 y.o. female with a hx of CAD s/p DES X2-LAD, DES-RCA 2019, hyperlipidemia, prediabetes, hypothyroidism, GERD, anxiety, depression last seen 07/10/2021.  Initially evaluated in 2019 for chest pain with cardiac catheterization scheduled but subsequently presented with pneumothorax.  Underwent LHC 06/2017 m-dRCA 99%-0% s/p DES, oLAD 30%, mLAD 30%, pLAD-1 75%-0% s/p DES, pLAD-2 60%-0% s/p DES, EF 55-65%.  Echo 07/2020 ordered due to dyspnea with EF 60 to 62%, grade 1 diastolic dysfunction, no RWMA, no significant valvular abnormalities.    She was seen 4/12-23 by Diona Browner, NP doing overall well with no angina, dyspnea.  Noted  her prior dyspnea was related to severe scoliosis.  Presents today for chest pain. Initial episode when she was walking out of a doctor's office. Recurred later that week at rest while laying in her bed lasted last than 10 minutes. Describes as "being squeezed" but felt a bit different than anginal equivalent.  Has trouble with her sacroiliac joint and scoliosis which limits exercise. Excited for a trip to visit her daughter and grandchildren who are 34, 2, and newborn twin boys.   EKGs/Labs/Other Studies Reviewed:   The following studies were reviewed today:   EKG:  EKG is  ordered today.  The ekg ordered today demonstrates NSR 60 bpm  Recent  Labs: 05/14/2021: ALT 21; BUN 22; Creat 0.99; Hemoglobin 14.4; Platelets 232; Potassium 4.5; Sodium 142; TSH 0.31  Recent Lipid Panel    Component Value Date/Time   CHOL 135 05/08/2020 0000   CHOL 121 12/09/2017 0909   TRIG 68 05/08/2020 0000   HDL 80 05/08/2020 0000   HDL 64 12/09/2017 0909   CHOLHDL 1.7 05/08/2020 0000   VLDL 29 12/28/2015 1056   LDLCALC 41 05/08/2020 0000     Home Medications   Current Meds  Medication Sig   albuterol (PROVENTIL HFA;VENTOLIN HFA) 108 (90 Base) MCG/ACT inhaler Inhale 1-2 puffs into the lungs every 4 (four) hours as needed for wheezing or shortness of breath.   aspirin EC 81 MG tablet Take 81 mg by mouth at bedtime.   atorvastatin (LIPITOR) 20 MG tablet Take 1 tablet (20 mg total) by mouth daily.   busPIRone (BUSPAR) 15 MG tablet Take 1 tablet (15 mg total) by mouth daily.   calcium-vitamin D (OSCAL WITH D) 500-200 MG-UNIT tablet Take 1 tablet by mouth daily.   clobetasol (TEMOVATE) 0.05 % external solution Apply topically 2 (two) times daily.   clonazePAM (KLONOPIN) 0.5 MG tablet Take 0.25 mg by mouth at bedtime.    denosumab (PROLIA) 60 MG/ML SOLN injection Inject 60 mg into the skin every 6 (six) months. Administer in upper arm, thigh, or abdomen   desvenlafaxine (PRISTIQ) 100 MG 24 hr tablet Take 100 mg by mouth in the morning.   gabapentin (NEURONTIN) 300 MG capsule Take 600 mg by mouth at bedtime.   ketoconazole (NIZORAL) 2 % shampoo Apply topically.   levothyroxine (SYNTHROID) 137 MCG tablet Take 1 tablet on Mondays and 1/2 tablet other days   Menthol, Topical Analgesic, (MINERAL ICE EX) Apply 1 application to affected sites one to two times a day as needed (for pain)   metFORMIN (GLUCOPHAGE-XR) 500 MG 24 hr tablet TAKE 1 TABLET EVERY DAY WITH BREAKFAST   metoprolol succinate (TOPROL-XL) 25 MG 24 hr tablet TAKE 1 TABLET (25 MG TOTAL) BY MOUTH DAILY.   nitroGLYCERIN (NITROSTAT) 0.4 MG SL tablet For chest pain, place one tablet under tongue.  May repeat every five minutes for no more than three tablets (total of 15 minutes). If no chest pain relief, call 911/EMS.   Omega-3 Fatty Acids (FISH OIL PO) Take 1 capsule by mouth daily.    oxybutynin (DITROPAN-XL) 5 MG 24 hr tablet Take 1 tablet (5 mg total) by mouth daily.   pantoprazole (PROTONIX) 40 MG tablet Take 1 tablet (40 mg total) by mouth daily.   REXULTI 1 MG TABS Take 1 tablet by mouth daily.    tolterodine (DETROL LA) 2 MG 24 hr capsule Take 2 mg by mouth in the morning.   traZODone (DESYREL) 150 MG tablet Take 150 mg by mouth at bedtime.  Review of Systems      All other systems reviewed and are otherwise negative except as noted above.  Physical Exam    VS:  BP 134/72 (BP Location: Left Arm, Patient Position: Sitting, Cuff Size: Normal)   Pulse 60   Ht '4\' 10"'$  (1.473 m)   Wt 129 lb 3.2 oz (58.6 kg)   BMI 27.00 kg/m  , BMI Body mass index is 27 kg/m.  Wt Readings from Last 3 Encounters:  11/05/21 129 lb 3.2 oz (58.6 kg)  10/14/21 127 lb (57.6 kg)  09/30/21 128 lb 0.6 oz (58.1 kg)     GEN: Well nourished, well developed, in no acute distress. HEENT: normal. Neck: Supple, no JVD, carotid bruits, or masses. Cardiac: RRR, no murmurs, rubs, or gallops. No clubbing, cyanosis, edema.  Radials/PT 2+ and equal bilaterally.  Respiratory:  Respirations regular and unlabored, clear to auscultation bilaterally. GI: Soft, nontender, nondistended. MS: No deformity or atrophy. Skin: Warm and dry, no rash. Neuro:  Strength and sensation are intact. Psych: Normal affect.  Assessment & Plan     CAD- Notes two episodes of chest discomfort. One more typical for angina as occrred with activity. The other atypical as it occurred at rest. EKG today no acute St/T wave changes. Continue GDMT Metoprolol, Atorvastatin, ASA. Plan for ischemia evaluation discussed Lexiscan Myoview versus cardiac PET and she prefers cardiac PET.  Shared Decision Making/Informed Consent The risks  [chest pain, shortness of breath, cardiac arrhythmias, dizziness, blood pressure fluctuations, myocardial infarction, stroke/transient ischemic attack, nausea, vomiting, allergic reaction, radiation exposure, metallic taste sensation and life-threatening complications (estimated to be 1 in 10,000)], benefits (risk stratification, diagnosing coronary artery disease, treatment guidance) and alternatives of a cardiac PET stress test were discussed in detail with Ms. Bacci and she agrees to proceed.   Addendum 11/21/21: Due to delays in patient scheduling she wished to pursue [place] instead.   Shared Decision Making/Informed Consent The risks [chest pain, shortness of breath, cardiac arrhythmias, dizziness, blood pressure fluctuations, myocardial infarction, stroke/transient ischemic attack, nausea, vomiting, allergic reaction, radiation exposure, metallic taste sensation and life-threatening complications (estimated to be 1 in 10,000)], benefits (risk stratification, diagnosing coronary artery disease, treatment guidance) and alternatives of a nuclear stress test were discussed in detail with Ms. Coyne and she agrees to proceed.   HLD, LDL goal is 70 - 05/2020 LDL 41. Update fasting lipid panel, CMP.  DM2 - Continue to follow with PCP.  Elevated BP reading - Discussed to monitor BP at home at least 2 hours after medications and sitting for 5-10 minutes. Mildly elevated in clinic today. Readdress at follow up.  OSA - CPAP compliance encouraged.       Disposition: Follow up in 3 month(s) with Kirk Ruths, MD or APP.  Signed, Loel Dubonnet, NP 11/08/2021, 10:51 AM [place]

## 2021-11-11 ENCOUNTER — Ambulatory Visit: Payer: Medicare HMO | Admitting: Physician Assistant

## 2021-11-17 ENCOUNTER — Encounter: Payer: Self-pay | Admitting: Physician Assistant

## 2021-11-18 ENCOUNTER — Ambulatory Visit (INDEPENDENT_AMBULATORY_CARE_PROVIDER_SITE_OTHER): Payer: Medicare HMO | Admitting: Physician Assistant

## 2021-11-18 ENCOUNTER — Encounter: Payer: Self-pay | Admitting: Physician Assistant

## 2021-11-18 VITALS — BP 110/64 | HR 80 | Ht <= 58 in | Wt 124.0 lb

## 2021-11-18 DIAGNOSIS — S63114D Dislocation of metacarpophalangeal joint of right thumb, subsequent encounter: Secondary | ICD-10-CM

## 2021-11-18 DIAGNOSIS — S63114A Dislocation of metacarpophalangeal joint of right thumb, initial encounter: Secondary | ICD-10-CM | POA: Insufficient documentation

## 2021-11-18 NOTE — Progress Notes (Signed)
   Acute Office Visit  Subjective:     Patient ID: Leslie Roberson, female    DOB: 1956-01-26, 66 y.o.   MRN: [id number]  Chief Complaint  Patient presents with   Hand Pain    Thumb pain    HPI Patient is in today for follow up on right thumb dislocation. She noticed pain and popping 3 weeks ago and worsened when she was at the beach and went to [facility]. Xrays were done and showed dislocation. She is right handed but does not remember any trauma to her thumb. She does report it keeps "popping in and out" and achy.   ROS See HPI.      Objective:    BP 110/64   Pulse 80   Ht '4\' 10"'$  (1.473 m)   Wt 124 lb (56.2 kg)   SpO2 99%   BMI 25.92 kg/m  BP Readings from Last 3 Encounters:  11/18/21 110/64  11/05/21 134/72  10/14/21 114/62   Wt Readings from Last 3 Encounters:  11/18/21 124 lb (56.2 kg)  11/05/21 129 lb 3.2 oz (58.6 kg)  10/14/21 127 lb (57.6 kg)      Physical Exam Musculoskeletal:     Comments: Right thumb with apparent large MCP appearance with minimal tenderness but great laxity and popping No swelling No pain over CMC or IP.   Neurological:     Mental Status: She is alert.          Assessment & Plan:  Marland KitchenMarland KitchenEustolia was seen today for hand pain.  Diagnoses and all orders for this visit:  Closed dislocation of metacarpophalangeal joint of right thumb, subsequent encounter -     Ambulatory referral to Orthopedic Surgery   Reviewed xrays that patient provided which shows dislocation Right thumb with a lot of laxity and at one point felt the thumb pop in and out of place Suggested thumb spica  Referral to ortho  Ok to use Voltaren gel as needed  Iran Planas, PA-C

## 2021-11-19 DIAGNOSIS — G5701 Lesion of sciatic nerve, right lower limb: Secondary | ICD-10-CM | POA: Diagnosis not present

## 2021-11-19 DIAGNOSIS — M5416 Radiculopathy, lumbar region: Secondary | ICD-10-CM | POA: Diagnosis not present

## 2021-11-21 ENCOUNTER — Telehealth: Payer: Self-pay | Admitting: Family

## 2021-11-21 ENCOUNTER — Ambulatory Visit: Payer: Medicare HMO | Admitting: Orthopedic Surgery

## 2021-11-21 DIAGNOSIS — I25118 Atherosclerotic heart disease of native coronary artery with other forms of angina pectoris: Secondary | ICD-10-CM

## 2021-11-21 NOTE — Telephone Encounter (Signed)
Please instead order lexiscan myoview for sooner scheduling per patient request.   Loel Dubonnet, NP

## 2021-11-21 NOTE — Telephone Encounter (Signed)
PET scan order cancelled and lexiscan ordered, Informed consent pended for provider. Patient notified and sent mychart message with instructions for testing! Will route to scheduling team for assistance with scheduling.     "Please instead order lexiscan myoview for sooner scheduling per patient request.    Loel Dubonnet, NP "

## 2021-11-21 NOTE — Addendum Note (Signed)
Addended by: Loel Dubonnet on: 11/21/2021 03:21 PM   Modules accepted: Orders

## 2021-11-21 NOTE — Telephone Encounter (Signed)
Test has been pre authed, patient should be getting scheduled soon

## 2021-11-21 NOTE — Telephone Encounter (Signed)
Addendum added to previous clinic note. Attestation completed.   Loel Dubonnet, NP

## 2021-11-21 NOTE — Telephone Encounter (Signed)
Pt calling stating Laurann Montana, NP ordered her a pet stress test, however the test availability is booked out for months. She states they discussed her having another test that is 4 hours long and she wants to know if she can do that one instead.

## 2021-11-22 ENCOUNTER — Ambulatory Visit: Payer: Self-pay

## 2021-11-22 ENCOUNTER — Encounter: Payer: Self-pay | Admitting: Orthopedic Surgery

## 2021-11-22 ENCOUNTER — Telehealth (HOSPITAL_COMMUNITY): Payer: Self-pay | Admitting: *Deleted

## 2021-11-22 ENCOUNTER — Ambulatory Visit: Payer: Medicare HMO | Admitting: Orthopedic Surgery

## 2021-11-22 ENCOUNTER — Ambulatory Visit (INDEPENDENT_AMBULATORY_CARE_PROVIDER_SITE_OTHER): Payer: Medicare HMO

## 2021-11-22 DIAGNOSIS — M79644 Pain in right finger(s): Secondary | ICD-10-CM

## 2021-11-22 DIAGNOSIS — M79645 Pain in left finger(s): Secondary | ICD-10-CM

## 2021-11-22 NOTE — Telephone Encounter (Signed)
Left message on voicemail per DPR in reference to upcoming appointment scheduled on 11/26/2021 at 10:45 with detailed instructions given per Myocardial Perfusion Study Information Sheet for the test. LM to arrive 15 minutes early, and that it is imperative to arrive on time for appointment to keep from having the test rescheduled. If you need to cancel or reschedule your appointment, please call the office within 24 hours of your appointment. Failure to do so may result in a cancellation of your appointment, and a $50 no show fee. Phone number given for call back for any questions.

## 2021-11-22 NOTE — Progress Notes (Signed)
Office Visit Note   Patient: Leslie Roberson           Date of Birth: [birth year]           MRN: [id number] Visit Date: 11/22/2021              Requested by: Donella Stade, PA-C [address] [place],  [place] [id number] PCP: Donella Stade, PA-C   Assessment & Plan: Visit Diagnoses:  1. Pain of left thumb   2. Pain of right thumb     Plan: Patient describes subjective "dislocations" of her right thumb at the MCP joint.  On exam today, her MCP joint is stable.  She has no laxity with radial and ulnar stress in extension or in slight flexion.  She has no instability with hyperextension or flexion at the MCP joint.  She has some mild tenderness over the dorsal aspect of the joint.  X-rays today show a concentrically reduced MCP joint.  I would like to refer her to hand therapy to work on strengthening the thumb and have a thermoplastic brace made for some soft tissue mobilization.  I can see her back again as needed.  Follow-Up Instructions: No follow-ups on file.   Orders:  Orders Placed This Encounter  Procedures   XR Hand Complete Right   No orders of the defined types were placed in this encounter.     Procedures: No procedures performed   Clinical Data: No additional findings.   Subjective: Chief Complaint  Patient presents with   Left Thumb - Pain    This is a 66 year old female presents with right thumb pain.  She describes "dislocations" of her right thumb with certain activities.  She denies any injury to this thumb.  She says that when she does activities like washing dishes the thumb will "pop in and out" of joint at the MCP joint.  This been going on for 3 weeks or so.  She is wearing an over-the-counter thumb brace that she bought online.  She denies any pain at the [facility] joint or the IP joint.  She denies any numbness or paresthesias in the hand.  She denies any pain elsewhere in the hand.    Review of Systems   Objective: Vital  Signs: There were no vitals taken for this visit.  Physical Exam Constitutional:      Appearance: Normal appearance.  Cardiovascular:     Rate and Rhythm: Normal rate.     Pulses: Normal pulses.  Pulmonary:     Effort: Pulmonary effort is normal.  Skin:    General: Skin is warm and dry.     Capillary Refill: Capillary refill takes less than 2 seconds.  Neurological:     Mental Status: She is alert.     Right Hand Exam   Tenderness  Right hand tenderness location: Mildly TTP at dorsal aspect of thumb MCPJ.  Other  Erythema: absent Sensation: normal Pulse: present  Comments:  No MCPJ laxity with radial/ulnar deviation in extension or 30 deg flexion.  No instability with hyper-extension or flexion at MCPJ. Some crepitus with CMC grind test but without pain.  No pain w/ manipulation of IP joint.       Specialty Comments:  No specialty comments available.  Imaging: No results found.   PMFS History: Patient Active Problem List   Diagnosis Date Noted   Closed dislocation of metacarpophalangeal joint of right thumb 11/18/2021   Mass of right axilla  05/15/2021   Ear fullness, left 04/08/2021   Bilateral hearing loss 04/05/2021   Impacted cerumen, left ear 04/05/2021   Controlled type 2 diabetes mellitus with complication, without long-term current use of insulin ([place]) 11/16/2020   Right hand pain 11/16/2020   Fall 11/16/2020   Cough 05/18/2020   Paroxysmal nocturnal dyspnea 05/14/2020   Orthopnea 05/14/2020   Acute conjunctivitis of both eyes 01/23/2020   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   Ingrown right greater toenail 07/19/2019   Status post hip replacement, left 07/19/2019   Left hip pain 07/19/2019   OSA on CPAP 05/20/2019   Snoring 05/02/2019   Non-restorative sleep 05/02/2019   Migraine without aura and without status migrainosus, not intractable 02/08/2019   OAB (overactive bladder) 02/08/2019   Diabetes mellitus without complication  ([place]) 63/14/9702   Hyperlipidemia LDL goal <70 11/29/2018   DJD (degenerative joint disease), lumbosacral 11/29/2018   Pre-operative clearance 11/29/2018   Nasal dryness 02/06/2018   Arthralgia 02/05/2018   Rhinorrhea 01/04/2018   Excessive sweating 09/22/2017   Tongue mass 08/14/2017   Angina, class III ([place]) 07/07/2017   S/P cardiac cath 07/07/2017   CAD S/P percutaneous coronary angioplasty    Pneumothorax 06/25/2017   Atypical chest pain 05/24/2017   Itchy scalp 05/24/2017   Right leg pain 02/06/2017   Chronic tension-type headache, intractable 02/05/2017   Prediabetes 02/05/2017   Lump in throat 01/27/2017   Dysphagia 01/27/2017   No energy 09/15/2016   Bad taste in mouth 07/26/2016   Paresthesia 04/03/2016   Facet hypertrophy of lumbosacral region 04/03/2016   Chronic back pain 04/03/2016   Right knee pain 02/19/2016   Rosacea 02/04/2016   Vaginal atrophy 01/31/2016   Idiopathic scoliosis 01/22/2016   Hip bursitis 12/25/2015   Biceps tendonitis on right 12/25/2015   Diverticulitis of colon 12/21/2015   GERD (gastroesophageal reflux disease) 11/27/2015   Osteoporosis 11/27/2015   GAD (generalized anxiety disorder) 11/27/2015   MDD (major depressive disorder), recurrent, in full remission ([place]) 11/27/2015   Chronic dryness of both eyes 11/27/2015   Acquired hypothyroidism 11/27/2015   Asthma, chronic 11/27/2015   Insomnia 11/27/2015   Seborrheic keratoses 11/27/2015   CAD in native artery 11/27/2015   Past Medical History:  Diagnosis Date   Allergy 1992   Anxiety    Arthritis    "right little finger; base of thumb left hand; spine" (07/07/2017)   Asthma, chronic 11/27/2015   Cataract 2021   Chronic back pain    Coronary artery disease    4/19 PCI/DEx2 to LAD, and PCI/DES x1 to RCA, normal EF   Depression    Diabetes mellitus without complication ([place]) 6378   Diverticulitis of colon 12/21/2015   Colonoscopy every 5 years/digestive health.    GERD  (gastroesophageal reflux disease) 11/27/2015   History of blood transfusion    "related to spinal fusions"   Hypothyroidism    Idiopathic scoliosis 01/22/2016   Osteoporosis 11/27/2015   On prolia.    Pneumonia 2016   Pneumothorax 06/26/2017   Pre-diabetes    Sleep apnea 2020   Thyroid disease     Family History  Problem Relation Age of Onset   Cancer Mother        lung   Depression Mother    Early death Mother    Heart attack Father    Hyperlipidemia Father    Hypertension Father    Arthritis Father    Heart disease Father    Diabetes Maternal Aunt  Hyperlipidemia Paternal Aunt    COPD Paternal Aunt    Hypertension Paternal Aunt    Stroke Paternal Aunt     Past Surgical History:  Procedure Laterality Date   ABDOMINAL HYSTERECTOMY     bladder mesh     S/P bladder sling"   COLON SURGERY  2007   CORONARY STENT INTERVENTION N/A 07/07/2017   Procedure: CORONARY STENT INTERVENTION;  Surgeon: Leonie Man, MD;  Location: [facility];  Service: Cardiovascular;  Laterality: N/A;   ELBOW SURGERY Right    "dislocation"   INCONTINENCE SURGERY     "didn't work"   INTRAVASCULAR PRESSURE WIRE/FFR STUDY N/A 07/07/2017   Procedure: INTRAVASCULAR PRESSURE WIRE/FFR STUDY;  Surgeon: Leonie Man, MD;  Location: [facility];  Service: Cardiovascular;  Laterality: N/A;   JOINT REPLACEMENT     LEFT HEART CATH AND CORONARY ANGIOGRAPHY N/A 07/07/2017   Procedure: LEFT HEART CATH AND CORONARY ANGIOGRAPHY;  Surgeon: Leonie Man, MD;  Location: [facility];  Service: Cardiovascular;  Laterality: N/A;   SPINAL FUSION  ~ 1970 X 3   "below neck - lower back"   TONSILLECTOMY     TOTAL HIP ARTHROPLASTY Left    Social History   Occupational History   Occupation: Retired  Tobacco Use   Smoking status: Never   Smokeless tobacco: Never  Vaping Use   Vaping Use: Never used  Substance and Sexual Activity   Alcohol use: Not Currently    Comment: Maybe 3  drinks/year   Drug use: No   Sexual activity: Yes    Birth control/protection: None

## 2021-11-25 DIAGNOSIS — I25118 Atherosclerotic heart disease of native coronary artery with other forms of angina pectoris: Secondary | ICD-10-CM | POA: Diagnosis not present

## 2021-11-26 ENCOUNTER — Ambulatory Visit (HOSPITAL_COMMUNITY): Payer: Medicare HMO | Attending: Internal Medicine

## 2021-11-26 DIAGNOSIS — I25118 Atherosclerotic heart disease of native coronary artery with other forms of angina pectoris: Secondary | ICD-10-CM | POA: Insufficient documentation

## 2021-11-26 LAB — MYOCARDIAL PERFUSION IMAGING
LV dias vol: 28 mL (ref 46–106)
LV sys vol: 5 mL
Nuc Stress EF: 83 %
Peak HR: 98 {beats}/min
Rest HR: 65 {beats}/min
Rest Nuclear Isotope Dose: 10.8 mCi
SDS: 2
SRS: 0
SSS: 2
ST Depression (mm): 0 mm
Stress Nuclear Isotope Dose: 31.4 mCi
TID: 0.67

## 2021-11-26 LAB — LIPID PANEL
Chol/HDL Ratio: 2.4 ratio (ref 0.0–4.4)
Cholesterol, Total: 140 mg/dL (ref 100–199)
HDL: 59 mg/dL (ref >39)
LDL Chol Calc (NIH): 53 mg/dL (ref 0–99)
Triglycerides: 169 mg/dL — ABNORMAL HIGH (ref 0–149)
VLDL Cholesterol Cal: 28 mg/dL (ref 5–40)

## 2021-11-26 LAB — HEPATIC FUNCTION PANEL
ALT: 23 IU/L (ref 0–32)
AST: 20 IU/L (ref 0–40)
Albumin: 4.2 g/dL (ref 3.9–4.9)
Alkaline Phosphatase: 72 IU/L (ref 44–121)
Bilirubin Total: <0.2 mg/dL (ref 0.0–1.2)
Bilirubin, Direct: <0.1 mg/dL (ref 0.00–0.40)
Total Protein: 6.2 g/dL (ref 6.0–8.5)

## 2021-11-26 MED ORDER — REGADENOSON 0.4 MG/5ML IV SOLN
0.4000 mg | Freq: Once | INTRAVENOUS | Status: AC
  Administered 2021-11-26: 0.4 mg via INTRAVENOUS

## 2021-11-26 MED ORDER — TECHNETIUM TC 99M TETROFOSMIN IV KIT
10.8000 | PACK | Freq: Once | INTRAVENOUS | Status: AC | PRN
  Administered 2021-11-26: 10.8 via INTRAVENOUS

## 2021-11-26 MED ORDER — TECHNETIUM TC 99M TETROFOSMIN IV KIT
31.4000 | PACK | Freq: Once | INTRAVENOUS | Status: AC | PRN
  Administered 2021-11-26: 31.4 via INTRAVENOUS

## 2021-11-27 ENCOUNTER — Encounter: Payer: Self-pay | Admitting: Rehabilitative and Restorative Service Providers"

## 2021-11-27 ENCOUNTER — Other Ambulatory Visit: Payer: Self-pay

## 2021-11-27 ENCOUNTER — Ambulatory Visit (INDEPENDENT_AMBULATORY_CARE_PROVIDER_SITE_OTHER): Payer: Medicare HMO | Admitting: Rehabilitative and Restorative Service Providers"

## 2021-11-27 DIAGNOSIS — M25541 Pain in joints of right hand: Secondary | ICD-10-CM | POA: Diagnosis not present

## 2021-11-27 NOTE — Therapy (Signed)
OUTPATIENT OCCUPATIONAL THERAPY ORTHO EVALUATION  Patient Name: Leslie Roberson MRN: [id number] DOB:1955/12/20, 66 y.o., female Today's Date: 11/27/2021  PCP: Iran Planas, PA-C REFERRING PROVIDER: Sherilyn Cooter, MD   OT End of Session - 11/27/21 1435     Visit Number 1    Number of Visits 6    Date for OT Re-Evaluation 12/27/21    OT Start Time 1435    OT Stop Time 1520    OT Time Calculation (min) 45 min    Activity Tolerance Patient tolerated treatment well;No increased pain;Patient limited by pain    Behavior During Therapy [facility] for tasks assessed/performed             Past Medical History:  Diagnosis Date   Allergy 1992   Anxiety    Arthritis    "right little finger; base of thumb left hand; spine" (07/07/2017)   Asthma, chronic 11/27/2015   Cataract 2021   Chronic back pain    Coronary artery disease    4/19 PCI/DEx2 to LAD, and PCI/DES x1 to RCA, normal EF   Depression    Diabetes mellitus without complication ([place]) 2683   Diverticulitis of colon 12/21/2015   Colonoscopy every 5 years/digestive health.    GERD (gastroesophageal reflux disease) 11/27/2015   History of blood transfusion    "related to spinal fusions"   Hypothyroidism    Idiopathic scoliosis 01/22/2016   Osteoporosis 11/27/2015   On prolia.    Pneumonia 2016   Pneumothorax 06/26/2017   Pre-diabetes    Sleep apnea 2020   Thyroid disease    Past Surgical History:  Procedure Laterality Date   ABDOMINAL HYSTERECTOMY     bladder mesh     S/P bladder sling"   COLON SURGERY  2007   CORONARY STENT INTERVENTION N/A 07/07/2017   Procedure: CORONARY STENT INTERVENTION;  Surgeon: Leonie Man, MD;  Location: [facility];  Service: Cardiovascular;  Laterality: N/A;   ELBOW SURGERY Right    "dislocation"   INCONTINENCE SURGERY     "didn't work"   INTRAVASCULAR PRESSURE WIRE/FFR STUDY N/A 07/07/2017   Procedure: INTRAVASCULAR PRESSURE WIRE/FFR STUDY;  Surgeon: Leonie Man,  MD;  Location: [facility];  Service: Cardiovascular;  Laterality: N/A;   JOINT REPLACEMENT     LEFT HEART CATH AND CORONARY ANGIOGRAPHY N/A 07/07/2017   Procedure: LEFT HEART CATH AND CORONARY ANGIOGRAPHY;  Surgeon: Leonie Man, MD;  Location: [place] [facility];  Service: Cardiovascular;  Laterality: N/A;   SPINAL FUSION  ~ 1970 X 3   "below neck - lower back"   TONSILLECTOMY     TOTAL HIP ARTHROPLASTY Left    Patient Active Problem List   Diagnosis Date Noted   Closed dislocation of metacarpophalangeal joint of right thumb 11/18/2021   Mass of right axilla 05/15/2021   Ear fullness, left 04/08/2021   Bilateral hearing loss 04/05/2021   Impacted cerumen, left ear 04/05/2021   Controlled type 2 diabetes mellitus with complication, without long-term current use of insulin ([place]) 11/16/2020   Right hand pain 11/16/2020   Fall 11/16/2020   Cough 05/18/2020   Paroxysmal nocturnal dyspnea 05/14/2020   Orthopnea 05/14/2020   Acute conjunctivitis of both eyes 01/23/2020   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   Ingrown right greater toenail 07/19/2019   Status post hip replacement, left 07/19/2019   Left hip pain 07/19/2019   OSA on CPAP 05/20/2019   Snoring 05/02/2019   Non-restorative sleep 05/02/2019  Migraine without aura and without status migrainosus, not intractable 02/08/2019   OAB (overactive bladder) 02/08/2019   Diabetes mellitus without complication ([place]) 65/68/1275   Hyperlipidemia LDL goal <70 11/29/2018   DJD (degenerative joint disease), lumbosacral 11/29/2018   Pre-operative clearance 11/29/2018   Nasal dryness 02/06/2018   Arthralgia 02/05/2018   Rhinorrhea 01/04/2018   Excessive sweating 09/22/2017   Tongue mass 08/14/2017   Angina, class III ([place]) 07/07/2017   S/P cardiac cath 07/07/2017   CAD S/P percutaneous coronary angioplasty    Pneumothorax 06/25/2017   Atypical chest pain 05/24/2017   Itchy scalp 05/24/2017   Right leg pain  02/06/2017   Chronic tension-type headache, intractable 02/05/2017   Prediabetes 02/05/2017   Lump in throat 01/27/2017   Dysphagia 01/27/2017   No energy 09/15/2016   Bad taste in mouth 07/26/2016   Paresthesia 04/03/2016   Facet hypertrophy of lumbosacral region 04/03/2016   Chronic back pain 04/03/2016   Right knee pain 02/19/2016   Rosacea 02/04/2016   Vaginal atrophy 01/31/2016   Idiopathic scoliosis 01/22/2016   Hip bursitis 12/25/2015   Biceps tendonitis on right 12/25/2015   Diverticulitis of colon 12/21/2015   GERD (gastroesophageal reflux disease) 11/27/2015   Osteoporosis 11/27/2015   GAD (generalized anxiety disorder) 11/27/2015   MDD (major depressive disorder), recurrent, in full remission ([place]) 11/27/2015   Chronic dryness of both eyes 11/27/2015   Acquired hypothyroidism 11/27/2015   Asthma, chronic 11/27/2015   Insomnia 11/27/2015   Seborrheic keratoses 11/27/2015   CAD in native artery 11/27/2015    ONSET DATE: about 3 weeks   REFERRING DIAG: M79.644 (ICD-10-CM) - Pain of right thumb  THERAPY DIAG:  Pain in joint of right hand - Plan: Ot plan of care cert/re-cert  Rationale for Evaluation and Treatment Rehabilitation  SUBJECTIVE:   SUBJECTIVE STATEMENT: She states her right thumb "pops" out of place at MCP J when washing hands or applying ulnar pressure at radial side of thumb MCP J which is painful but then pain doesn't linger long. This happens somewhat frequently during the day. She states some aching pains at night as well. She has pre-fab hand/thumb soft brace on that seems to limit radial deviation at MCP J and somewhat flexion/ext as well.    PERTINENT HISTORY: Per MD: "Subjective right thumb instability at MCPJ.  No objective instability.  Can work on strengthening of the thumb with a thermoplast brace."  PmHx shows past dislocation of right thumb MCP J.   PRECAUTIONS: She does have adhesive allergy to skin.   WEIGHT BEARING RESTRICTIONS  No  PAIN:  Are you having pain? No Rating: 0/10 at rest now, up to 7/10 in past week at worst in right MCP J of thumb when washing hands  FALLS: Has patient fallen in last 6 months? No  LIVING ENVIRONMENT: Lives with: lives with their spouse   PLOF: Independent  PATIENT GOALS to wash hands without pain in right thumb   OBJECTIVE:   HAND DOMINANCE: Right   ADLs: Overall ADLs: States decreased ability to grab, hold household objects, pain and inability to open containers, pain and subluxation with washing hands.    FUNCTIONAL OUTCOME MEASURES: Eval: Quck DASH 13% impairment today   UPPER EXTREMITY ROM     Active ROM Right eval Left eval  Wrist flexion 60 72  Wrist extension 65 63  (Blank rows = not tested)  Active ROM Right eval Left eval  Thumb MCP (0-60) 63 47  Thumb IP (0-80) 16 24  Thumb Radial abd/add (0-55) 57 45   Thumb Palmar abd/add (0-45) Tender in adduction --> 54 35   Thumb Opposition to Small Finger Full to base  Full to base   (Blank rows = not tested)   UPPER EXTREMITY MMT:     MMT Right eval  Wrist flexion 5/5  Wrist extension 5/5  thumb ulnar deviation 4/5  thumb radial deviation 5/5  IF abduction 5/5  (Blank rows = not tested)  HAND FUNCTION: Eval: Grip strength Right: 31 lbs (pain in UCL area of MCP J); Left: 23 lbs   COORDINATION: Eval: WFL on eval b/l   SENSATION: Eval: Sensation intact b/l   EDEMA:   Eval: none significant   COGNITION: Overall cognitive status: WFL for evaluation today   OBSERVATIONS:   Eval: She has observed look of beginning collapse deformity of thumb (zig-zag) though very mild now.  1st dorsal interossei is strong, negative grind test.  She does have some tenderness to MCP UCL area with gripping and palmar adduction.  This may show a sprain to UCL of thumb, but is most not TTP, not swollen, etc. She does have a wider PROM to radial deviation at MCP that to ulnar deviation (but it's also similar b/l).     TODAY'S TREATMENT:  Eval: Her pre-fab brace seems to be sufficient to rest UCL /thumb today. She is edu to wear at all times, day/night as able. Remove for hygiene and she is edu to wash hands differently to avoid this pain.  She states understanding.   OT also supplies the following HEP to do carefully, pain free 3-4 x day to help motion and prevent stiffness from brace wear.  She demo's all back well, no added pain.  Also edu for modality use for pain/swelling.    Exercises - Seated Wrist Flexion Stretch  - 2-3 x daily - 3 reps - 15 hold - Stretch thumb downward   - 2-3 x daily - 3-5 reps - 15 sec hold - Stretch Thumb into "C-Shape" (don't use other thumb)  - 2-3 x daily - 3-5 reps - 15 sec hold - Towel Roll Grip with Forearm in Neutral  - 2-3 x daily - 3-5 reps - 10 sec hold - Thumb Opposition  - 2-3 x daily - 10 reps   PATIENT EDUCATION: Education details: See tx section above for details  Person educated: Patient Education method: Verbal Instruction, Teach back, Handouts  Education comprehension: States and demonstrates understanding, Additional Education required    HOME EXERCISE PROGRAM: Access Code: [email or web address] URL: [email or web address] Date: 11/27/2021 Prepared by: Benito Mccreedy  GOALS: Goals reviewed with patient? Yes   SHORT TERM GOALS: (STG required if POC>30 days)  Pt will obtain protective, custom orthotic. Target date: 12/13/21 Goal status: INITIAL  2.  Pt will demo/state understanding of initial HEP to improve pain levels and prerequisite motion. Target date: 12/13/21 Goal status: INITIAL   LONG TERM GOALS:  Pt will improve functional ability by decreased impairment per Quick DASH assessment from 13% to 8% or better, for better quality of life. Target date: 12/27/21 Goal status: INITIAL  2.  Pt will improve grip strength in right hand from painful 31 lbs to at least non-painful 35lbs for functional use at home and in IADLs. Target date:  12/27/21 Goal status: INITIAL  3.  Pt will improve A/ROM in right wrist flexion from 60 to at least 65, to have functional motion for tasks like reach and grasp.  Target date: 12/27/21 Goal  status: INITIAL  4.  Pt will improve strength in thumb ulnar deviation from 4/5 MMT to at least 4+/5 MMT to have increased functional ability to carry out selfcare and higher-level homecare tasks with no difficulty. Target date: 12/27/21 Goal status: INITIAL  5.  Pt will decrease pain at worst from 7/10 to 2/10 or better to have better sleep and occupational participation in daily roles. Target date: 12/27/21 Goal status: INITIAL    ASSESSMENT:  CLINICAL IMPRESSION: Patient is a 66 y.o. female who was seen today for occupational therapy evaluation for right thumb pain, instability, decreased functional ability. She presents as a possible, mild right thumb MCP UCL sprain. She will benefit from OP OT.   PERFORMANCE DEFICITS in functional skills including ADLs, pain, body mechanics, decreased knowledge of precautions, and UE functional use, cognitive skills including safety awareness, and psychosocial skills including coping strategies, environmental adaptation, and habits.   IMPAIRMENTS are limiting patient from ADLs.   COMORBIDITIES has co-morbidities such as GERD, anxiety, GAD, MDD, L THA, right biceps tendonitis, paresthesia, DJD, DM II, s/p cardiac cath, spinal fusion, and more   that affects occupational performance. Patient will benefit from skilled OT to address above impairments and improve overall function.  MODIFICATION OR ASSISTANCE TO COMPLETE EVALUATION: No modification of tasks or assist necessary to complete an evaluation.  OT OCCUPATIONAL PROFILE AND HISTORY: Detailed assessment: Review of records and additional review of physical, cognitive, psychosocial history related to current functional performance.  CLINICAL DECISION MAKING: Moderate - several treatment options, min-mod task  modification necessary  REHAB POTENTIAL: Good  EVALUATION COMPLEXITY: Low      PLAN: OT FREQUENCY: 1-2x/week  OT DURATION: 4 weeks (through 12/27/21)   PLANNED INTERVENTIONS: self care/ADL training, therapeutic exercise, therapeutic activity, manual therapy, passive range of motion, splinting, compression bandaging, moist heat, cryotherapy, contrast bath, patient/family education, coping strategies training, and DME and/or AE instructions  RECOMMENDED OTHER SERVICES: none now   CONSULTED AND AGREED WITH PLAN OF CARE: Patient  PLAN FOR NEXT SESSION: check with her about pain, HEP and brace use, upgrade as needed/appropriate   Benito Mccreedy, OTR/L, CHT 11/27/2021, 3:46 PM  Referring diagnosis? M79.644 (ICD-10-CM) - Pain of right thumb  Treatment diagnosis? (if different than referring diagnosis) M25.541 What was this (referring dx) caused by? '[]'$  Surgery '[]'$  Fall '[x]'$  Ongoing issue '[]'$  Arthritis '[]'$  Other: ____________  Laterality: '[x]'$  Rt '[]'$  Lt '[]'$  Both  Check all possible CPT codes:  *CHOOSE 10 OR LESS*    '[x]'$  97110 (Therapeutic Exercise)  '[]'$  92507 (SLP Treatment)  '[]'$  97112 (Neuro Re-ed)   '[]'$  92526 (Swallowing Treatment)   '[]'$  97116 (Gait Training)   '[]'$  [id number] (Cognitive Training, 1st 15 minutes) '[x]'$  97140 (Manual Therapy)   '[]'$  97130 (Cognitive Training, each add'l 15 minutes)  '[]'$  97164 (Re-evaluation)                              '[]'$  Other, List CPT Code ____________  '[x]'$  40102 (Therapeutic Activities)     '[x]'$  97535 (Self Care)   '[]'$  All codes above (97110 - 97535)  '[]'$  97012 (Mechanical Traction)  '[]'$  97014 (E-stim Unattended)  '[]'$  97032 (E-stim manual)  '[]'$  97033 (Ionto)  '[x]'$  97035 (Ultrasound) '[]'$  97750 (Physical Performance Training) '[]'$  [id number] (Aquatic Therapy) '[]'$  97016 (Vasopneumatic Device) '[]'$  [id number] (Paraffin) '[]'$  97034 (Contrast Bath) '[]'$  97597 (Wound Care 1st 20 sq cm) '[]'$  97598 (Wound Care each add'l 20 sq cm) '[x]'$  97760 (Orthotic  Fabrication, Fitting, Training  Initial) '[]'$  [id number] (Prosthetic Management and Training Initial) '[x]'$  [id number] (Orthotic or Prosthetic Training/ Modification Subsequent)

## 2021-12-04 ENCOUNTER — Encounter: Payer: Self-pay | Admitting: Rehabilitative and Restorative Service Providers"

## 2021-12-04 ENCOUNTER — Ambulatory Visit (INDEPENDENT_AMBULATORY_CARE_PROVIDER_SITE_OTHER): Payer: Medicare HMO | Admitting: Rehabilitative and Restorative Service Providers"

## 2021-12-04 ENCOUNTER — Other Ambulatory Visit: Payer: Self-pay | Admitting: Physician Assistant

## 2021-12-04 DIAGNOSIS — M542 Cervicalgia: Secondary | ICD-10-CM | POA: Diagnosis not present

## 2021-12-04 DIAGNOSIS — M545 Low back pain, unspecified: Secondary | ICD-10-CM

## 2021-12-04 DIAGNOSIS — M6281 Muscle weakness (generalized): Secondary | ICD-10-CM

## 2021-12-04 DIAGNOSIS — G8929 Other chronic pain: Secondary | ICD-10-CM

## 2021-12-04 DIAGNOSIS — M25541 Pain in joints of right hand: Secondary | ICD-10-CM | POA: Diagnosis not present

## 2021-12-04 NOTE — Therapy (Signed)
OUTPATIENT OCCUPATIONAL THERAPY TREATMENT NOTE   Patient Name: Leslie Roberson MRN: [id number] DOB:Apr 02, 1955, 66 y.o., female Today's Date: 12/04/2021  PCP: Iran Planas, PA-C REFERRING PROVIDER: Sherilyn Cooter, MD  END OF SESSION:   OT End of Session - 12/04/21 1524     Visit Number 2    Number of Visits 6    Date for OT Re-Evaluation 12/27/21    OT Start Time 1524    OT Stop Time 1557    OT Time Calculation (min) 33 min    Equipment Utilized During Treatment orthotic materials    Activity Tolerance Patient tolerated treatment well;No increased pain;Patient limited by pain    Behavior During Therapy [facility] for tasks assessed/performed             Past Medical History:  Diagnosis Date   Allergy 1992   Anxiety    Arthritis    "right little finger; base of thumb left hand; spine" (07/07/2017)   Asthma, chronic 11/27/2015   Cataract 2021   Chronic back pain    Coronary artery disease    4/19 PCI/DEx2 to LAD, and PCI/DES x1 to RCA, normal EF   Depression    Diabetes mellitus without complication ([place]) 7902   Diverticulitis of colon 12/21/2015   Colonoscopy every 5 years/digestive health.    GERD (gastroesophageal reflux disease) 11/27/2015   History of blood transfusion    "related to spinal fusions"   Hypothyroidism    Idiopathic scoliosis 01/22/2016   Osteoporosis 11/27/2015   On prolia.    Pneumonia 2016   Pneumothorax 06/26/2017   Pre-diabetes    Sleep apnea 2020   Thyroid disease    Past Surgical History:  Procedure Laterality Date   ABDOMINAL HYSTERECTOMY     bladder mesh     S/P bladder sling"   COLON SURGERY  2007   CORONARY STENT INTERVENTION N/A 07/07/2017   Procedure: CORONARY STENT INTERVENTION;  Surgeon: Leonie Man, MD;  Location: [place] [facility];  Service: Cardiovascular;  Laterality: N/A;   ELBOW SURGERY Right    "dislocation"   INCONTINENCE SURGERY     "didn't work"   INTRAVASCULAR PRESSURE WIRE/FFR STUDY N/A 07/07/2017    Procedure: INTRAVASCULAR PRESSURE WIRE/FFR STUDY;  Surgeon: Leonie Man, MD;  Location: [place];  Service: Cardiovascular;  Laterality: N/A;   JOINT REPLACEMENT     LEFT HEART CATH AND CORONARY ANGIOGRAPHY N/A 07/07/2017   Procedure: LEFT HEART CATH AND CORONARY ANGIOGRAPHY;  Surgeon: Leonie Man, MD;  Location: [place] [facility];  Service: Cardiovascular;  Laterality: N/A;   SPINAL FUSION  ~ 1970 X 3   "below neck - lower back"   TONSILLECTOMY     TOTAL HIP ARTHROPLASTY Left    Patient Active Problem List   Diagnosis Date Noted   Closed dislocation of metacarpophalangeal joint of right thumb 11/18/2021   Mass of right axilla 05/15/2021   Ear fullness, left 04/08/2021   Bilateral hearing loss 04/05/2021   Impacted cerumen, left ear 04/05/2021   Controlled type 2 diabetes mellitus with complication, without long-term current use of insulin ([place]) 11/16/2020   Right hand pain 11/16/2020   Fall 11/16/2020   Cough 05/18/2020   Paroxysmal nocturnal dyspnea 05/14/2020   Orthopnea 05/14/2020   Acute conjunctivitis of both eyes 01/23/2020   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   Ingrown right greater toenail 07/19/2019   Status post hip replacement, left 07/19/2019   Left hip pain 07/19/2019  OSA on CPAP 05/20/2019   Snoring 05/02/2019   Non-restorative sleep 05/02/2019   Migraine without aura and without status migrainosus, not intractable 02/08/2019   OAB (overactive bladder) 02/08/2019   Diabetes mellitus without complication ([place]) 04/54/0981   Hyperlipidemia LDL goal <70 11/29/2018   DJD (degenerative joint disease), lumbosacral 11/29/2018   Pre-operative clearance 11/29/2018   Nasal dryness 02/06/2018   Arthralgia 02/05/2018   Rhinorrhea 01/04/2018   Excessive sweating 09/22/2017   Tongue mass 08/14/2017   Angina, class III ([place]) 07/07/2017   S/P cardiac cath 07/07/2017   CAD S/P percutaneous coronary angioplasty    Pneumothorax 06/25/2017    Atypical chest pain 05/24/2017   Itchy scalp 05/24/2017   Right leg pain 02/06/2017   Chronic tension-type headache, intractable 02/05/2017   Prediabetes 02/05/2017   Lump in throat 01/27/2017   Dysphagia 01/27/2017   No energy 09/15/2016   Bad taste in mouth 07/26/2016   Paresthesia 04/03/2016   Facet hypertrophy of lumbosacral region 04/03/2016   Chronic back pain 04/03/2016   Right knee pain 02/19/2016   Rosacea 02/04/2016   Vaginal atrophy 01/31/2016   Idiopathic scoliosis 01/22/2016   Hip bursitis 12/25/2015   Biceps tendonitis on right 12/25/2015   Diverticulitis of colon 12/21/2015   GERD (gastroesophageal reflux disease) 11/27/2015   Osteoporosis 11/27/2015   GAD (generalized anxiety disorder) 11/27/2015   MDD (major depressive disorder), recurrent, in full remission ([place]) 11/27/2015   Chronic dryness of both eyes 11/27/2015   Acquired hypothyroidism 11/27/2015   Asthma, chronic 11/27/2015   Insomnia 11/27/2015   Seborrheic keratoses 11/27/2015   CAD in native artery 11/27/2015    ONSET DATE: about 3 weeks    REFERRING DIAG: M79.644 (ICD-10-CM) - Pain of right thumb  THERAPY DIAG:  Pain in joint of right hand  Muscle weakness (generalized)  Cervicalgia  Acute right-sided low back pain without sciatica  Chronic bilateral low back pain without sciatica  Rationale for Evaluation and Treatment Rehabilitation  PERTINENT HISTORY: Per MD: "Subjective right thumb instability at MCPJ.  No objective instability.  Can work on strengthening of the thumb with a thermoplast brace."  PmHx shows past dislocation of right thumb MCP J.    PRECAUTIONS: She does have adhesive allergy to skin.    WEIGHT BEARING RESTRICTIONS No  SUBJECTIVE:  She states cutting wheel to left makes thumb hurt and that she had a hard time figuring out a better way to wash her hands. Her thumb still hurts when doing this.    PAIN:  Are you having pain? No  Rating: 0/10 at rest now, up to  7/10 in past week at worst in right MCP J of thumb when washing hands    OBJECTIVE: (All objective assessments below are from initial evaluation on: 11/27/21 unless otherwise specified.)   HAND DOMINANCE: Right    ADLs: Overall ADLs: States decreased ability to grab, hold household objects, pain and inability to open containers, pain and subluxation with washing hands.      FUNCTIONAL OUTCOME MEASURES: Eval: Quck DASH 13% impairment today    UPPER EXTREMITY ROM      Active ROM Right eval Left eval Right TBD  Wrist flexion 60 72   Wrist extension 65 63   (Blank rows = not tested)   Active ROM Right eval Left eval Right TBD  Thumb MCP (0-60) 63 47   Thumb IP (0-80) 16 24   Thumb Radial abd/add (0-55) 57 45    Thumb Palmar abd/add (0-45) Tender in  adduction --> 54 35    Thumb Opposition to Small Finger Full to base  Full to base    (Blank rows = not tested)     UPPER EXTREMITY MMT:      MMT Right eval  Wrist flexion 5/5  Wrist extension 5/5  thumb ulnar deviation 4/5  thumb radial deviation 5/5  IF abduction 5/5  (Blank rows = not tested)   HAND FUNCTION: Eval: Grip strength Right: 31 lbs (pain in UCL area of MCP J); Left: 23 lbs    COORDINATION: Eval: WFL on eval b/l    SENSATION: Eval: Sensation intact b/l    EDEMA:             Eval: none significant    COGNITION: Overall cognitive status: WFL for evaluation today    OBSERVATIONS:             Eval: She has observed look of beginning collapse deformity of thumb (zig-zag) though very mild now.  1st dorsal interossei is strong, negative grind test.  She does have some tenderness to MCP UCL area with gripping and palmar adduction.  This may show a sprain to UCL of thumb, but is most not TTP, not swollen, etc. She does have a wider PROM to radial deviation at MCP that to ulnar deviation (but it's also similar b/l).      TODAY'S TREATMENT:  12/04/21: Due to still having pain while driving in prefab brace,  custom orthotic fabrication was indicated due to pt's injured thumb MCP J (UCL) and need for safe, functional positioning. OT fabricated custom hand based thumb spica leaving IP J free for pt today to stabilize MCP J. It fit well with no areas of pressure, pt states a comfortable fit. Pt was educated on the wearing schedule, to call or come in ASAP if it is causing any irritation or is not achieving desired function. It will be checked/adjusted in upcoming sessions, as needed. Pt states understanding.   Next, she does hand washing task with OT who teaches her "milking" type motion to wash b/l thumbs and this works well, doesn't cause pain or radial deviation at MCP J.  OT also has her do simulated turning wheel task with new orthotic on and now it is not painful at all. OT reviewed HEP with her and recommended ~2 or maybe 3 x day careful performance but to mainly be resting and avoiding pain for the next 2 weeks, then f/u with OT again. She states understanding.      PATIENT EDUCATION: Education details: See tx section above for details  Person educated: Patient Education method: Verbal Instruction, Teach back, Handouts  Education comprehension: States and demonstrates understanding, Additional Education required      HOME EXERCISE PROGRAM: Access Code: [email or web address] URL: [email or web address]    GOALS: Goals reviewed with patient? Yes     SHORT TERM GOALS: (STG required if POC>30 days)   Pt will obtain protective, custom orthotic. Target date: 12/13/21 Goal status: 12/04/21: MET   2.  Pt will demo/state understanding of initial HEP to improve pain levels and prerequisite motion. Target date: 12/13/21 Goal status: 12/04/21: MET     LONG TERM GOALS:   Pt will improve functional ability by decreased impairment per Quick DASH assessment from 13% to 8% or better, for better quality of life. Target date: 12/27/21 Goal status: INITIAL   2.  Pt will improve grip strength in right  hand from painful 31 lbs to at least non-painful 35lbs for  functional use at home and in IADLs. Target date: 12/27/21 Goal status: INITIAL   3.  Pt will improve A/ROM in right wrist flexion from 60 to at least 65, to have functional motion for tasks like reach and grasp.  Target date: 12/27/21 Goal status: INITIAL   4.  Pt will improve strength in thumb ulnar deviation from 4/5 MMT to at least 4+/5 MMT to have increased functional ability to carry out selfcare and higher-level homecare tasks with no difficulty. Target date: 12/27/21 Goal status: INITIAL   5.  Pt will decrease pain at worst from 7/10 to 2/10 or better to have better sleep and occupational participation in daily roles. Target date: 12/27/21 Goal status: INITIAL       ASSESSMENT:   CLINICAL IMPRESSION: 12/04/21: Due to continued injury at times, she is still tender, still presents like UCL sprain.  OT will f/u with her in 2 more weeks to see how she is healing, check motion, etc.      PLAN: OT FREQUENCY: 1-2x/week   OT DURATION: 4 weeks (through 12/27/21)    PLANNED INTERVENTIONS: self care/ADL training, therapeutic exercise, therapeutic activity, manual therapy, passive range of motion, splinting, compression bandaging, moist heat, cryotherapy, contrast bath, patient/family education, coping strategies training, and DME and/or AE instructions   RECOMMENDED OTHER SERVICES: none now    CONSULTED AND AGREED WITH PLAN OF CARE: Patient   PLAN FOR NEXT SESSION:  F/u in 2 weeks to determine healing and goals.     Benito Mccreedy, OTR/L, CHT 12/04/2021, 5:26 PM

## 2021-12-05 ENCOUNTER — Telehealth: Payer: Self-pay

## 2021-12-05 DIAGNOSIS — Z79899 Other long term (current) drug therapy: Secondary | ICD-10-CM

## 2021-12-05 NOTE — Telephone Encounter (Signed)
Patient due for Prolia after 12/12/21. Please call and schedule labs and prolia.   Please order labs.

## 2021-12-06 NOTE — Telephone Encounter (Signed)
Called patient and scheduled labs for 9/12, followed by Prolia shots on 9/26. Leslie Roberson

## 2021-12-09 ENCOUNTER — Encounter: Payer: Self-pay | Admitting: Physician Assistant

## 2021-12-10 ENCOUNTER — Other Ambulatory Visit: Payer: Medicare HMO

## 2021-12-10 DIAGNOSIS — Z79899 Other long term (current) drug therapy: Secondary | ICD-10-CM | POA: Diagnosis not present

## 2021-12-10 MED ORDER — AMOXICILLIN 500 MG PO TABS: ORAL_TABLET | ORAL | 0 refills | Status: DC

## 2021-12-11 ENCOUNTER — Encounter: Payer: Self-pay | Admitting: Physician Assistant

## 2021-12-11 ENCOUNTER — Ambulatory Visit (INDEPENDENT_AMBULATORY_CARE_PROVIDER_SITE_OTHER): Payer: Medicare HMO | Admitting: Physician Assistant

## 2021-12-11 VITALS — BP 107/61 | HR 89 | Wt 127.0 lb

## 2021-12-11 DIAGNOSIS — J4520 Mild intermittent asthma, uncomplicated: Secondary | ICD-10-CM

## 2021-12-11 DIAGNOSIS — R0602 Shortness of breath: Secondary | ICD-10-CM | POA: Diagnosis not present

## 2021-12-11 DIAGNOSIS — R051 Acute cough: Secondary | ICD-10-CM

## 2021-12-11 DIAGNOSIS — J189 Pneumonia, unspecified organism: Secondary | ICD-10-CM

## 2021-12-11 LAB — COMPLETE METABOLIC PANEL WITH GFR
AG Ratio: 1.8 (calc) (ref 1.0–2.5)
ALT: 27 U/L (ref 6–29)
AST: 20 U/L (ref 10–35)
Albumin: 4.1 g/dL (ref 3.6–5.1)
Alkaline phosphatase (APISO): 78 U/L (ref 37–153)
BUN: 17 mg/dL (ref 7–25)
CO2: 33 mmol/L — ABNORMAL HIGH (ref 20–32)
Calcium: 9.9 mg/dL (ref 8.6–10.4)
Chloride: 105 mmol/L (ref 98–110)
Creat: 0.9 mg/dL (ref 0.50–1.05)
Globulin: 2.3 g/dL (calc) (ref 1.9–3.7)
Glucose, Bld: 116 mg/dL — ABNORMAL HIGH (ref 65–99)
Potassium: 4.6 mmol/L (ref 3.5–5.3)
Sodium: 143 mmol/L (ref 135–146)
Total Bilirubin: 0.3 mg/dL (ref 0.2–1.2)
Total Protein: 6.4 g/dL (ref 6.1–8.1)
eGFR: 71 mL/min/{1.73_m2} (ref >=60)

## 2021-12-11 MED ORDER — DEXAMETHASONE 4 MG PO TABS: 4.0000 mg | ORAL_TABLET | Freq: Two times a day (BID) | ORAL | 0 refills | Status: DC

## 2021-12-11 MED ORDER — IPRATROPIUM-ALBUTEROL 0.5-2.5 (3) MG/3ML IN SOLN
3.0000 mL | Freq: Once | RESPIRATORY_TRACT | Status: AC
  Administered 2021-12-11: 3 mL via RESPIRATORY_TRACT

## 2021-12-11 MED ORDER — ALBUTEROL SULFATE HFA 108 (90 BASE) MCG/ACT IN AERS: 1.0000 | INHALATION_SPRAY | RESPIRATORY_TRACT | 0 refills | Status: DC | PRN

## 2021-12-11 MED ORDER — AZITHROMYCIN 250 MG PO TABS: ORAL_TABLET | ORAL | 0 refills | Status: DC

## 2021-12-11 NOTE — Progress Notes (Signed)
Ok to schedule prolia.

## 2021-12-11 NOTE — Patient Instructions (Signed)

## 2021-12-11 NOTE — Progress Notes (Signed)
Acute Office Visit  Subjective:     Patient ID: Leslie Roberson, female    DOB: 10-10-1955, 66 y.o.   MRN: [id number]  Chief Complaint  Patient presents with   Cough    HPI Patient is in today for cough, chest tightness and shortness of breath for 11 days. She does have known asthma but has not used her nebulizer because it is expired. She is not on any daily inhalers for asthma. Symptoms started with sore throat and chest tightness but then has improved some until now she has the productive cough and some SOB. No fever, chills, body aches. Not tested for covid. No one else in home sick.   .. Active Ambulatory Problems    Diagnosis Date Noted   GERD (gastroesophageal reflux disease) 11/27/2015   Osteoporosis 11/27/2015   GAD (generalized anxiety disorder) 11/27/2015   MDD (major depressive disorder), recurrent, in full remission ([place]) 11/27/2015   Chronic dryness of both eyes 11/27/2015   Acquired hypothyroidism 11/27/2015   Asthma, chronic 11/27/2015   Insomnia 11/27/2015   Seborrheic keratoses 11/27/2015   Diverticulitis of colon 12/21/2015   Hip bursitis 12/25/2015   Biceps tendonitis on right 12/25/2015   Idiopathic scoliosis 01/22/2016   Vaginal atrophy 01/31/2016   Rosacea 02/04/2016   Right knee pain 02/19/2016   Paresthesia 04/03/2016   Facet hypertrophy of lumbosacral region 04/03/2016   Chronic back pain 04/03/2016   Bad taste in mouth 07/26/2016   No energy 09/15/2016   Lump in throat 01/27/2017   Dysphagia 01/27/2017   Chronic tension-type headache, intractable 02/05/2017   Prediabetes 02/05/2017   Right leg pain 02/06/2017   Atypical chest pain 05/24/2017   Itchy scalp 05/24/2017   Pneumothorax 06/25/2017   Angina, class III ([place]) 07/07/2017   CAD S/P percutaneous coronary angioplasty    Tongue mass 08/14/2017   Excessive sweating 09/22/2017   Rhinorrhea 01/04/2018   Arthralgia 02/05/2018   Nasal dryness 02/06/2018   Hyperlipidemia LDL goal <70  11/29/2018   DJD (degenerative joint disease), lumbosacral 11/29/2018   Pre-operative clearance 11/29/2018   Diabetes mellitus without complication ([place]) 75/64/3329   Migraine without aura and without status migrainosus, not intractable 02/08/2019   OAB (overactive bladder) 02/08/2019   Snoring 05/02/2019   Non-restorative sleep 05/02/2019   OSA on CPAP 05/20/2019   Ingrown right greater toenail 07/19/2019   Status post hip replacement, left 07/19/2019   Left hip pain 07/19/2019   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   Acute conjunctivitis of both eyes 01/23/2020   CAD in native artery 11/27/2015   S/P cardiac cath 07/07/2017   Paroxysmal nocturnal dyspnea 05/14/2020   Orthopnea 05/14/2020   Cough 05/18/2020   Controlled type 2 diabetes mellitus with complication, without long-term current use of insulin ([place]) 11/16/2020   Right hand pain 11/16/2020   Fall 11/16/2020   Bilateral hearing loss 04/05/2021   Impacted cerumen, left ear 04/05/2021   Ear fullness, left 04/08/2021   Mass of right axilla 05/15/2021   Closed dislocation of metacarpophalangeal joint of right thumb 11/18/2021   Community acquired pneumonia of left lower lobe of lung 12/11/2021   Resolved Ambulatory Problems    Diagnosis Date Noted   Acute medial meniscus tear 04/23/2016   Left knee pain 04/28/2016   Mild intermittent asthma without complication 51/88/4166   Past Medical History:  Diagnosis Date   Allergy 1992   Anxiety    Arthritis    Cataract 2021   Coronary artery disease  Depression    History of blood transfusion    Hypothyroidism    Pneumonia 2016   Pre-diabetes    Sleep apnea 2020   Thyroid disease      ROS See HPI.      Objective:    BP 107/61   Pulse 89   Wt 127 lb (57.6 kg)   SpO2 94%   BMI 26.54 kg/m  BP Readings from Last 3 Encounters:  12/11/21 107/61  11/18/21 110/64  11/05/21 134/72   Wt Readings from Last 3 Encounters:  12/11/21 127 lb (57.6 kg)   11/26/21 124 lb (56.2 kg)  11/18/21 124 lb (56.2 kg)    Breathing treatment with duoneb given in office today with improvement on patient's part of breathing but pulse ox stayed at 94 percent.   Physical Exam Constitutional:      Appearance: Normal appearance.  HENT:     Head: Normocephalic.  Cardiovascular:     Rate and Rhythm: Normal rate and regular rhythm.     Pulses: Normal pulses.  Pulmonary:     Effort: Pulmonary effort is normal.     Breath sounds: No wheezing.     Comments: Left lower quadrant squeaking and then after nebulizer treatment heard diffuse lower quadrant rhonchi.  Musculoskeletal:     Cervical back: Normal range of motion.     Right lower leg: No edema.     Left lower leg: No edema.     Comments: Scoliosis.   Neurological:     General: No focal deficit present.     Mental Status: She is alert and oriented to person, place, and time.  Psychiatric:        Mood and Affect: Mood normal.          Assessment & Plan:  Marland KitchenMarland KitchenSanjuanita was seen today for cough.  Diagnoses and all orders for this visit:  Community acquired pneumonia of left lower lobe of lung -     albuterol (VENTOLIN HFA) 108 (90 Base) MCG/ACT inhaler; Inhale 1-2 puffs into the lungs every 4 (four) hours as needed for wheezing or shortness of breath. -     dexamethasone (DECADRON) 4 MG tablet; Take 1 tablet (4 mg total) by mouth 2 (two) times daily with a meal. -     azithromycin (ZITHROMAX Z-PAK) 250 MG tablet; Take 2 tablets (500 mg) on  Day 1,  followed by 1 tablet (250 mg) once daily on Days 2 through 5.  Acute cough -     ipratropium-albuterol (DUONEB) 0.5-2.5 (3) MG/3ML nebulizer solution 3 mL  SOB (shortness of breath) -     ipratropium-albuterol (DUONEB) 0.5-2.5 (3) MG/3ML nebulizer solution 3 mL  Mild intermittent chronic asthma without complication -     albuterol (VENTOLIN HFA) 108 (90 Base) MCG/ACT inhaler; Inhale 1-2 puffs into the lungs every 4 (four) hours as needed for wheezing  or shortness of breath. -     dexamethasone (DECADRON) 4 MG tablet; Take 1 tablet (4 mg total) by mouth 2 (two) times daily with a meal.  Pulse ox is 94 percent down from her baseline Nebulizer treatment revealed lots of congestion in left lower lobe Suspect LLL pneumonia Sent zpak/dexamethasone/albuterol inhaler If not improving by Friday will get CXR. Rest and hydrate  Follow up as needed or if symptoms worsen  Iran Planas, PA-C

## 2021-12-14 ENCOUNTER — Other Ambulatory Visit: Payer: Self-pay | Admitting: Physician Assistant

## 2021-12-14 DIAGNOSIS — K2100 Gastro-esophageal reflux disease with esophagitis, without bleeding: Secondary | ICD-10-CM

## 2021-12-16 DIAGNOSIS — M5416 Radiculopathy, lumbar region: Secondary | ICD-10-CM | POA: Diagnosis not present

## 2021-12-16 DIAGNOSIS — M7061 Trochanteric bursitis, right hip: Secondary | ICD-10-CM | POA: Diagnosis not present

## 2021-12-16 DIAGNOSIS — G5701 Lesion of sciatic nerve, right lower limb: Secondary | ICD-10-CM | POA: Diagnosis not present

## 2021-12-18 ENCOUNTER — Telehealth: Payer: Self-pay | Admitting: Physician Assistant

## 2021-12-18 ENCOUNTER — Encounter: Payer: Self-pay | Admitting: Rehabilitative and Restorative Service Providers"

## 2021-12-18 ENCOUNTER — Ambulatory Visit: Payer: Medicare HMO | Admitting: Rehabilitative and Restorative Service Providers"

## 2021-12-18 DIAGNOSIS — M25541 Pain in joints of right hand: Secondary | ICD-10-CM | POA: Diagnosis not present

## 2021-12-18 DIAGNOSIS — M6281 Muscle weakness (generalized): Secondary | ICD-10-CM | POA: Diagnosis not present

## 2021-12-18 DIAGNOSIS — M7061 Trochanteric bursitis, right hip: Secondary | ICD-10-CM | POA: Diagnosis not present

## 2021-12-18 DIAGNOSIS — M5416 Radiculopathy, lumbar region: Secondary | ICD-10-CM | POA: Diagnosis not present

## 2021-12-18 DIAGNOSIS — G5701 Lesion of sciatic nerve, right lower limb: Secondary | ICD-10-CM | POA: Diagnosis not present

## 2021-12-18 NOTE — Therapy (Signed)
OUTPATIENT OCCUPATIONAL THERAPY TREATMENT & PROGRESS NOTE   Patient Name: Leslie Roberson MRN: [id number] DOB:1955-09-17, 66 y.o., female Today's Date: 12/18/2021  PCP: Iran Planas, PA-C REFERRING PROVIDER: Sherilyn Cooter, MD  Progress Note  Reporting Period 11/27/21 to 12/18/21.   See note below for Objective Data and Assessment of Progress/Goals.      END OF SESSION:   OT End of Session - 12/18/21 1516     Visit Number 3    Number of Visits 6    Date for OT Re-Evaluation 01/24/22    OT Start Time 4818    OT Stop Time 1558    OT Time Calculation (min) 42 min    Activity Tolerance Patient tolerated treatment well;No increased pain;Patient limited by pain    Behavior During Therapy [facility] for tasks assessed/performed              Past Medical History:  Diagnosis Date   Allergy 1992   Anxiety    Arthritis    "right little finger; base of thumb left hand; spine" (07/07/2017)   Asthma, chronic 11/27/2015   Cataract 2021   Chronic back pain    Coronary artery disease    4/19 PCI/DEx2 to LAD, and PCI/DES x1 to RCA, normal EF   Depression    Diabetes mellitus without complication ([place]) 5631   Diverticulitis of colon 12/21/2015   Colonoscopy every 5 years/digestive health.    GERD (gastroesophageal reflux disease) 11/27/2015   History of blood transfusion    "related to spinal fusions"   Hypothyroidism    Idiopathic scoliosis 01/22/2016   Osteoporosis 11/27/2015   On prolia.    Pneumonia 2016   Pneumothorax 06/26/2017   Pre-diabetes    Sleep apnea 2020   Thyroid disease    Past Surgical History:  Procedure Laterality Date   ABDOMINAL HYSTERECTOMY     bladder mesh     S/P bladder sling"   COLON SURGERY  2007   CORONARY STENT INTERVENTION N/A 07/07/2017   Procedure: CORONARY STENT INTERVENTION;  Surgeon: Leonie Man, MD;  Location: [place] [facility];  Service: Cardiovascular;  Laterality: N/A;   ELBOW SURGERY Right    "dislocation"    INCONTINENCE SURGERY     "didn't work"   INTRAVASCULAR PRESSURE WIRE/FFR STUDY N/A 07/07/2017   Procedure: INTRAVASCULAR PRESSURE WIRE/FFR STUDY;  Surgeon: Leonie Man, MD;  Location: [place] [facility];  Service: Cardiovascular;  Laterality: N/A;   JOINT REPLACEMENT     LEFT HEART CATH AND CORONARY ANGIOGRAPHY N/A 07/07/2017   Procedure: LEFT HEART CATH AND CORONARY ANGIOGRAPHY;  Surgeon: Leonie Man, MD;  Location: [place] [facility];  Service: Cardiovascular;  Laterality: N/A;   SPINAL FUSION  ~ 1970 X 3   "below neck - lower back"   TONSILLECTOMY     TOTAL HIP ARTHROPLASTY Left    Patient Active Problem List   Diagnosis Date Noted   Community acquired pneumonia of left lower lobe of lung 12/11/2021   Closed dislocation of metacarpophalangeal joint of right thumb 11/18/2021   Mass of right axilla 05/15/2021   Ear fullness, left 04/08/2021   Bilateral hearing loss 04/05/2021   Impacted cerumen, left ear 04/05/2021   Controlled type 2 diabetes mellitus with complication, without long-term current use of insulin ([place] [facility]) 11/16/2020   Right hand pain 11/16/2020   Fall 11/16/2020   Cough 05/18/2020   Paroxysmal nocturnal dyspnea 05/14/2020   Orthopnea 05/14/2020   Acute conjunctivitis of both eyes 01/23/2020  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   Ingrown right greater toenail 07/19/2019   Status post hip replacement, left 07/19/2019   Left hip pain 07/19/2019   OSA on CPAP 05/20/2019   Snoring 05/02/2019   Non-restorative sleep 05/02/2019   Migraine without aura and without status migrainosus, not intractable 02/08/2019   OAB (overactive bladder) 02/08/2019   Diabetes mellitus without complication ([place]) 68/10/8108   Hyperlipidemia LDL goal <70 11/29/2018   DJD (degenerative joint disease), lumbosacral 11/29/2018   Pre-operative clearance 11/29/2018   Nasal dryness 02/06/2018   Arthralgia 02/05/2018   Rhinorrhea 01/04/2018   Excessive sweating 09/22/2017    Tongue mass 08/14/2017   Angina, class III ([place]) 07/07/2017   S/P cardiac cath 07/07/2017   CAD S/P percutaneous coronary angioplasty    Pneumothorax 06/25/2017   Atypical chest pain 05/24/2017   Itchy scalp 05/24/2017   Right leg pain 02/06/2017   Chronic tension-type headache, intractable 02/05/2017   Prediabetes 02/05/2017   Lump in throat 01/27/2017   Dysphagia 01/27/2017   No energy 09/15/2016   Bad taste in mouth 07/26/2016   Paresthesia 04/03/2016   Facet hypertrophy of lumbosacral region 04/03/2016   Chronic back pain 04/03/2016   Right knee pain 02/19/2016   Rosacea 02/04/2016   Vaginal atrophy 01/31/2016   Idiopathic scoliosis 01/22/2016   Hip bursitis 12/25/2015   Biceps tendonitis on right 12/25/2015   Diverticulitis of colon 12/21/2015   GERD (gastroesophageal reflux disease) 11/27/2015   Osteoporosis 11/27/2015   GAD (generalized anxiety disorder) 11/27/2015   MDD (major depressive disorder), recurrent, in full remission ([place]) 11/27/2015   Chronic dryness of both eyes 11/27/2015   Acquired hypothyroidism 11/27/2015   Asthma, chronic 11/27/2015   Insomnia 11/27/2015   Seborrheic keratoses 11/27/2015   CAD in native artery 11/27/2015    ONSET DATE: about 3 weeks    REFERRING DIAG: M79.644 (ICD-10-CM) - Pain of right thumb  THERAPY DIAG:  Pain in joint of right hand - Plan: Ot plan of care cert/re-cert  Muscle weakness (generalized) - Plan: Ot plan of care cert/re-cert  Rationale for Evaluation and Treatment Rehabilitation  PERTINENT HISTORY: Per MD: "Subjective right thumb instability at MCPJ.  No objective instability.  Can work on strengthening of the thumb with a thermoplast brace."  PmHx shows past dislocation of right thumb MCP J.    PRECAUTIONS: She does have adhesive allergy to skin.    WEIGHT BEARING RESTRICTIONS No   SUBJECTIVE:  She states doing better, orthotic rubs only very slightly, thumb "pops" out of place very rarely now and pain  is less.     PAIN:  Are you having pain? No  Rating: 0/10 at rest now, up to 3-4/10 in past week at worst in right MCP J of thumb which is much better    OBJECTIVE: (All objective assessments below are from initial evaluation on: 11/27/21 unless otherwise specified.)   HAND DOMINANCE: Right    ADLs: Overall ADLs: 12/18/21: Improving but still mild issues washing hands, opening milk top (screw on type)     FUNCTIONAL OUTCOME MEASURES: 12/18/21: Quick DASH 5% impairment today   Eval: Quck DASH 13% impairment today    UPPER EXTREMITY ROM      Active ROM Right eval Left eval Right 12/18/21  Wrist flexion 60 72 78  Wrist extension 65 63 77  (Blank rows = not tested)   Active ROM Right eval Left eval Right 12/18/21  Thumb MCP (0-60) 63 47 66  Thumb IP (0-80)  16 24 49  Thumb Radial abd/add (0-55) 57 45  51  Thumb Palmar abd/add (0-45) Tender in adduction --> 54 35  60 (non-tender)  Thumb Opposition to Small Finger Full to base  Full to base  Easily touches base w/o pain  (Blank rows = not tested)     UPPER EXTREMITY MMT:      MMT Right eval Right 12/18/21  Wrist flexion 5/5   Wrist extension 5/5   thumb ulnar deviation 4/5 5/5  thumb radial deviation 5/5 5/5  IF abduction 5/5   (Blank rows = not tested)   HAND FUNCTION: 12/18/21: Grip strength Right: 33 lbs NO PAIN  Eval: Grip strength Right: 31 lbs (pain in UCL area of MCP J); Left: 23 lbs    COORDINATION: Eval: WFL on eval b/l      OBSERVATIONS:             12/18/21:  no overt tenderness about right thumb, thumb feels more stable and less concerning at UCL of MCP J. Orthotic allows her to rest and is still a benefit. She is fnl weaning from it though.   Eval: She has observed look of beginning collapse deformity of thumb (zig-zag) though very mild now.  1st dorsal interossei is strong, negative grind test.  She does have some tenderness to MCP UCL area with gripping and palmar adduction.  This may show a sprain  to UCL of thumb, but is most not TTP, not swollen, etc. She does have a wider PROM to radial deviation at MCP that to ulnar deviation (but it's also similar b/l).      TODAY'S TREATMENT:  12/18/21: She does ROM and gripping for new measures and exercise and reviews HEP from last 2 weeks.  She states doing well with it all, no added pain, and due to more stable now, OT adds new CMC J strength, pinch strength, grip strength with putty as listed below. She tolerates these exercises and activities well today.  OT also quickly adjusts 2 areas of thumb orthotic that rubbed mildly. She states "much better."  OT reviews plan for cautiously "testing waters" to return to normal duties as thumb heals and strengthens. She states understanding.   Exercises - Seated Wrist Flexion Stretch  - 2-3 x daily - 3 reps - 15 hold - Stretch thumb downward   - 2-3 x daily - 3-5 reps - 15 sec hold - Stretch Thumb into "C-Shape" (don't use other thumb)  - 2-3 x daily - 3-5 reps - 15 sec hold - Towel Roll Grip with Forearm in Neutral  - 2-3 x daily - 3-5 reps - 10 sec hold - C-Strength (try using rubber band)   - 2-3 x daily - 5-10 reps - 2-3 sec hold - Finger Spread (Use rubber band)   - 2-3 x daily - 5 reps - 2-3 sec  hold - Full Fist  - 2-3 x daily - 5 reps - Thumb Opposition with Putty  - 2-3 x daily - 5 reps    PATIENT EDUCATION: Education details: See tx section above for details  Person educated: Patient Education method: Verbal Instruction, Teach back, Handouts  Education comprehension: States and demonstrates understanding, Additional Education required      HOME EXERCISE PROGRAM: Access Code: [email or web address] URL: [email or web address]    GOALS: Goals reviewed with patient? Yes     SHORT TERM GOALS: (STG required if POC>30 days)   Pt will obtain protective, custom orthotic. Target date: 12/13/21 Goal status:  12/04/21: MET   2.  Pt will demo/state understanding of initial HEP to improve pain  levels and prerequisite motion. Target date: 12/13/21 Goal status: 12/04/21: MET     LONG TERM GOALS:   Pt will improve functional ability by decreased impairment per Quick DASH assessment from 13% to 8% or better, for better quality of life. Target date: 12/27/21 Goal status: 12/18/21: MET 5%    2.  Pt will improve grip strength in right hand from painful 31 lbs to at least non-painful 35lbs for functional use at home and in IADLs. Target date: 12/27/21 Goal status: 12/18/21: Partially met (now 33# no pain)    3.  Pt will improve A/ROM in right wrist flexion from 60 to at least 65, to have functional motion for tasks like reach and grasp.  Target date: 12/27/21 Goal status: 12/18/21:  MET   4.  Pt will improve strength in thumb ulnar deviation from 4/5 MMT to at least 4+/5 MMT to have increased functional ability to carry out selfcare and higher-level homecare tasks with no difficulty. Target date: 12/27/21 Goal status: 12/18/21: MET   5.  Pt will decrease pain at worst from 7/10 to 2/10 or better to have better sleep and occupational participation in daily roles. Target date: 12/27/21 Goal status: 12/18/21: Partially met (now 3-4/10)       ASSESSMENT:   CLINICAL IMPRESSION: 12/18/21: She is much better after 3 visits and ~1 month of rest in orthotic and HEP. She did not quite meet all goals and is still mildly concerned that thumb still "pops" at times, though very infrequent now. OT will request more time/visit to f/u with her in 2 weeks after her vacation.     PLAN: OT FREQUENCY: 1-2x/week   OT DURATION: up to 4 additional weeks (12/27/21 - 01/24/22)    PLANNED INTERVENTIONS: self care/ADL training, therapeutic exercise, therapeutic activity, manual therapy, passive range of motion, splinting, compression bandaging, moist heat, cryotherapy, contrast bath, patient/family education, coping strategies training, and DME and/or AE instructions   RECOMMENDED OTHER SERVICES: none now     CONSULTED AND AGREED WITH PLAN OF CARE: Patient   PLAN FOR NEXT SESSION:  F/U in 2 weeks after her vacation to check strength, new HEP, goals, etc.    Benito Mccreedy, OTR/L, CHT 12/18/2021, 4:13 PM

## 2021-12-18 NOTE — Telephone Encounter (Signed)
Patient dropped off a surgical consent form for PCP to fill out and was seen last week. Patient was informed of potential fees and 3-5 day turn-around. Paperwork was left in provider's box, patient requested completed paperwork be faxed. Leslie Roberson

## 2021-12-19 ENCOUNTER — Telehealth: Payer: Self-pay | Admitting: *Deleted

## 2021-12-19 NOTE — Telephone Encounter (Signed)
   Pre-operative Risk Assessment    Patient Name: Leslie Roberson  DOB: March 16, 1956 MRN: [id number]      Request for Surgical Clearance    Procedure:  right SI joint fusion  Date of Surgery:  Clearance TBD                                 Surgeon:  max cohen md Surgeon's Group or Practice Name:  [facility] Phone number:  [phone] Fax number:  [phone]   Type of Clearance Requested:   - Pharmacy:  Hold Aspirin x 7 days   Type of Anesthesia:  General    Additional requests/questions:    Arman Filter   12/19/2021, 8:46 AM

## 2021-12-20 ENCOUNTER — Other Ambulatory Visit: Payer: Self-pay | Admitting: Physician Assistant

## 2021-12-20 ENCOUNTER — Encounter: Payer: Self-pay | Admitting: Physician Assistant

## 2021-12-20 ENCOUNTER — Other Ambulatory Visit: Payer: Self-pay | Admitting: Nurse Practitioner

## 2021-12-20 DIAGNOSIS — E118 Type 2 diabetes mellitus with unspecified complications: Secondary | ICD-10-CM

## 2021-12-20 DIAGNOSIS — K2100 Gastro-esophageal reflux disease with esophagitis, without bleeding: Secondary | ICD-10-CM

## 2021-12-20 DIAGNOSIS — I2510 Atherosclerotic heart disease of native coronary artery without angina pectoris: Secondary | ICD-10-CM

## 2021-12-20 DIAGNOSIS — E785 Hyperlipidemia, unspecified: Secondary | ICD-10-CM

## 2021-12-20 NOTE — Telephone Encounter (Signed)
Clearance sent to Dr. Patrice Paradise at fax [phone] with confirmation received.

## 2021-12-23 NOTE — Telephone Encounter (Signed)
   Patient Name: Leslie Roberson  DOB: 1955-06-19 MRN: [id number]  Primary Cardiologist: Kirk Ruths, MD  Chart reviewed as part of pre-operative protocol coverage.   66 year old female with a history of CAD s/p DES x2-pLAD, DES-RCA in 2019, hyperlipidemia, prediabetes, and hypothyroidism.  She was last seen in the office on 11/05/2021 and reported recurrent squeezing chest pain.  Lexiscan Myoview on 11/26/2021 was negative for ischemia.  Dr. Stanford Breed, given history of pLAD stenting, please advise on holding aspirin prior to R SI joint fusion, date TBD.  Please route your response to P CV DIV PREOP.   Thank you.   [name], NP 12/23/2021, 11:42 AM

## 2021-12-24 ENCOUNTER — Ambulatory Visit (INDEPENDENT_AMBULATORY_CARE_PROVIDER_SITE_OTHER): Payer: Medicare HMO | Admitting: Physician Assistant

## 2021-12-24 VITALS — BP 111/75 | HR 85

## 2021-12-24 DIAGNOSIS — M810 Age-related osteoporosis without current pathological fracture: Secondary | ICD-10-CM

## 2021-12-24 MED ORDER — DENOSUMAB 60 MG/ML ~~LOC~~ SOSY
60.0000 mg | PREFILLED_SYRINGE | Freq: Once | SUBCUTANEOUS | Status: AC
  Administered 2021-12-24: 60 mg via SUBCUTANEOUS

## 2021-12-24 NOTE — Progress Notes (Signed)
Agree with above plan. 

## 2021-12-24 NOTE — Telephone Encounter (Signed)
   Patient Name: Leslie Roberson  DOB: 1956/01/30 MRN: [id number]  Primary Cardiologist: Kirk Ruths, MD  Chart reviewed as part of pre-operative protocol coverage. Given past medical history and time since last visit, based on ACC/AHA guidelines, [place] would be at acceptable risk for the planned procedure without further cardiovascular testing.   Myoview 11/26/21 low risk study.   Per Dr. Stanford Breed may hold Aspirin 7 days prior to planned procedure.   The patient was advised that if she develops new symptoms prior to surgery to contact our office to arrange for a follow-up visit, and she verbalized understanding.  I will route this recommendation to the requesting party via Epic fax function and remove from pre-op pool.  Please call with questions.  Loel Dubonnet, NP 12/24/2021, 4:39 PM

## 2021-12-24 NOTE — Progress Notes (Signed)
   Established Patient Office Visit  Subjective   Patient ID: Tanishia Lemaster, female    DOB: [birth year]  Age: 66 y.o. MRN: [id number]  Chief Complaint  Patient presents with   Osteoporosis    HPI  [place] is here for Prolia injection. She reports taking vitamin D and calcium daily.   ROS    Objective:     BP 111/75   Pulse 85   SpO2 97%    Physical Exam   No results found for any visits on 12/24/21.    The 10-year ASCVD risk score (Arnett DK, et al., 2019) is: 7.4%    Assessment & Plan:  Prolia injection - Patient tolerated injection well without complications. Patient advised to schedule next injection 6 months and 1 day from today.    Problem List Items Addressed This Visit       Unprioritized   Osteoporosis - Primary    Return in about 6 months (around 06/24/2022) for Prolia injection. Durene Romans, Monico Blitz, [place]

## 2021-12-27 ENCOUNTER — Other Ambulatory Visit: Payer: Self-pay | Admitting: Physician Assistant

## 2021-12-31 ENCOUNTER — Encounter: Payer: Self-pay | Admitting: Rehabilitative and Restorative Service Providers"

## 2021-12-31 ENCOUNTER — Ambulatory Visit: Payer: Medicare HMO | Admitting: Rehabilitative and Restorative Service Providers"

## 2021-12-31 DIAGNOSIS — M545 Low back pain, unspecified: Secondary | ICD-10-CM

## 2021-12-31 DIAGNOSIS — M542 Cervicalgia: Secondary | ICD-10-CM

## 2021-12-31 DIAGNOSIS — M6281 Muscle weakness (generalized): Secondary | ICD-10-CM

## 2021-12-31 DIAGNOSIS — M25541 Pain in joints of right hand: Secondary | ICD-10-CM | POA: Diagnosis not present

## 2021-12-31 DIAGNOSIS — G8929 Other chronic pain: Secondary | ICD-10-CM

## 2021-12-31 NOTE — Therapy (Signed)
OUTPATIENT OCCUPATIONAL THERAPY TREATMENT & DISCHARGE NOTE   Patient Name: Leslie Roberson MRN: [id number] DOB:1955/08/11, 66 y.o., female Today's Date: 12/31/2021  PCP: Iran Planas, PA-C REFERRING PROVIDER: Sherilyn Cooter, MD  END OF SESSION:   OT End of Session - 12/31/21 1509     Visit Number 4    Number of Visits 6    Date for OT Re-Evaluation 01/24/22    OT Start Time 1512    OT Stop Time 1547    OT Time Calculation (min) 35 min    Activity Tolerance Patient tolerated treatment well;No increased pain;Patient limited by pain    Behavior During Therapy [facility] for tasks assessed/performed             Past Medical History:  Diagnosis Date   Allergy 1992   Anxiety    Arthritis    "right little finger; base of thumb left hand; spine" (07/07/2017)   Asthma, chronic 11/27/2015   Cataract 2021   Chronic back pain    Coronary artery disease    4/19 PCI/DEx2 to LAD, and PCI/DES x1 to RCA, normal EF   Depression    Diabetes mellitus without complication ([place]) 8657   Diverticulitis of colon 12/21/2015   Colonoscopy every 5 years/digestive health.    GERD (gastroesophageal reflux disease) 11/27/2015   History of blood transfusion    "related to spinal fusions"   Hypothyroidism    Idiopathic scoliosis 01/22/2016   Osteoporosis 11/27/2015   On prolia.    Pneumonia 2016   Pneumothorax 06/26/2017   Pre-diabetes    Sleep apnea 2020   Thyroid disease    Past Surgical History:  Procedure Laterality Date   ABDOMINAL HYSTERECTOMY     bladder mesh     S/P bladder sling"   COLON SURGERY  2007   CORONARY STENT INTERVENTION N/A 07/07/2017   Procedure: CORONARY STENT INTERVENTION;  Surgeon: Leonie Man, MD;  Location: [place];  Service: Cardiovascular;  Laterality: N/A;   ELBOW SURGERY Right    "dislocation"   INCONTINENCE SURGERY     "didn't work"   INTRAVASCULAR PRESSURE WIRE/FFR STUDY N/A 07/07/2017   Procedure: INTRAVASCULAR PRESSURE WIRE/FFR  STUDY;  Surgeon: Leonie Man, MD;  Location: [facility];  Service: Cardiovascular;  Laterality: N/A;   JOINT REPLACEMENT     LEFT HEART CATH AND CORONARY ANGIOGRAPHY N/A 07/07/2017   Procedure: LEFT HEART CATH AND CORONARY ANGIOGRAPHY;  Surgeon: Leonie Man, MD;  Location: [facility] [facility];  Service: Cardiovascular;  Laterality: N/A;   SPINAL FUSION  ~ 1970 X 3   "below neck - lower back"   TONSILLECTOMY     TOTAL HIP ARTHROPLASTY Left    Patient Active Problem List   Diagnosis Date Noted   Community acquired pneumonia of left lower lobe of lung 12/11/2021   Closed dislocation of metacarpophalangeal joint of right thumb 11/18/2021   Mass of right axilla 05/15/2021   Ear fullness, left 04/08/2021   Bilateral hearing loss 04/05/2021   Impacted cerumen, left ear 04/05/2021   Controlled type 2 diabetes mellitus with complication, without long-term current use of insulin ([place]) 11/16/2020   Right hand pain 11/16/2020   Fall 11/16/2020   Cough 05/18/2020   Paroxysmal nocturnal dyspnea 05/14/2020   Orthopnea 05/14/2020   Acute conjunctivitis of both eyes 01/23/2020   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   Ingrown right greater toenail 07/19/2019   Status post hip replacement, left 07/19/2019   Left  hip pain 07/19/2019   OSA on CPAP 05/20/2019   Snoring 05/02/2019   Non-restorative sleep 05/02/2019   Migraine without aura and without status migrainosus, not intractable 02/08/2019   OAB (overactive bladder) 02/08/2019   Diabetes mellitus without complication ([place]) 50/56/9794   Hyperlipidemia LDL goal <70 11/29/2018   DJD (degenerative joint disease), lumbosacral 11/29/2018   Pre-operative clearance 11/29/2018   Nasal dryness 02/06/2018   Arthralgia 02/05/2018   Rhinorrhea 01/04/2018   Excessive sweating 09/22/2017   Tongue mass 08/14/2017   Angina, class III ([place]) 07/07/2017   S/P cardiac cath 07/07/2017   CAD S/P percutaneous coronary angioplasty     Pneumothorax 06/25/2017   Atypical chest pain 05/24/2017   Itchy scalp 05/24/2017   Right leg pain 02/06/2017   Chronic tension-type headache, intractable 02/05/2017   Prediabetes 02/05/2017   Lump in throat 01/27/2017   Dysphagia 01/27/2017   No energy 09/15/2016   Bad taste in mouth 07/26/2016   Paresthesia 04/03/2016   Facet hypertrophy of lumbosacral region 04/03/2016   Chronic back pain 04/03/2016   Right knee pain 02/19/2016   Rosacea 02/04/2016   Vaginal atrophy 01/31/2016   Idiopathic scoliosis 01/22/2016   Hip bursitis 12/25/2015   Biceps tendonitis on right 12/25/2015   Diverticulitis of colon 12/21/2015   GERD (gastroesophageal reflux disease) 11/27/2015   Osteoporosis 11/27/2015   GAD (generalized anxiety disorder) 11/27/2015   MDD (major depressive disorder), recurrent, in full remission ([place]) 11/27/2015   Chronic dryness of both eyes 11/27/2015   Acquired hypothyroidism 11/27/2015   Asthma, chronic 11/27/2015   Insomnia 11/27/2015   Seborrheic keratoses 11/27/2015   CAD in native artery 11/27/2015      ONSET DATE: around October 06, 2021   REFERRING DIAG: I01.655 (ICD-10-CM) - Pain of right thumb  THERAPY DIAG:  Pain in joint of right hand  Muscle weakness (generalized)  Cervicalgia  Acute right-sided low back pain without sciatica  Chronic bilateral low back pain without sciatica  Rationale for Evaluation and Treatment Rehabilitation  PERTINENT HISTORY: Per MD: "Subjective right thumb instability at MCPJ.  No objective instability.  Can work on strengthening of the thumb with a thermoplast brace."  PmHx shows past dislocation of right thumb MCP J.    PRECAUTIONS: She does have adhesive allergy to skin.    WEIGHT BEARING RESTRICTIONS No    SUBJECTIVE:  She arrives after 2 weeks of self-management and states "It feels about the same as 2 weeks ago- still does 'little pops' when washing hands, rubbing hair at times."  (Further testing does show  improvements, however.)    PAIN:  Are you having pain? No  Rating: 0/10 at rest now, up to 2/10 in past week at worst in right MCP J of thumb which is much better   OBJECTIVE: (All objective assessments below are from initial evaluation on: 11/27/21 unless otherwise specified.)   HAND DOMINANCE: Right     FUNCTIONAL OUTCOME MEASURES: 12/18/21: Quick DASH 5% impairment today (no significant issues with function)   Eval: Quck DASH 13% impairment today    UPPER EXTREMITY ROM     12/31/21: Rt thumb: full opposition to base of SF, full fist, mild loss of thumb IP J motion with positional changes at MCP J ("tenodesis" like tightness).   Active ROM Right eval Left eval Right 12/18/21  Wrist flexion 60 72 78  Wrist extension 65 63 77  (Blank rows = not tested)   Active ROM Right eval Left eval Right 12/18/21 Rt  12/31/21  Thumb MCP (0-60) 63 47 66 74  Thumb IP (0-80) 16 24 49 24  Thumb Radial abd/add (0-55) 57 45  51   Thumb Palmar abd/add (0-45) Tender in adduction --> 54 35  60 (non-tender)   Thumb Opposition to Small Finger Full to base  Full to base  Easily touches base w/o pain   (Blank rows = not tested)     UPPER EXTREMITY MMT:     12/31/21: OT tests Rt thumb against resistance in fle, ext, abd, add- she has no pain or instability now, is equal or better than Lt thumb for all. Not TTP to MCP J.     HAND FUNCTION: 12/31/21: Right: 37 lbs NO PAIN  12/18/21: Grip strength Right: 33 lbs NO PAIN  Eval: Grip strength Right: 31 lbs (pain in UCL area of MCP J); Left: 23 lbs       TODAY'S TREATMENT:  12/31/21: She performs AROM for new measures, gripping, and resisted thumb strength b/l against OTR resistance.  She is very equal b/l now, non-tender about MCP J, no instability with testing.  She is doing well with HEP gripping and states pinching is "easy" now.  She does still feel healthy stretch/strain in MCP J with gripping as well as isometric resistance in adduction, so this  is added to HEP today, and HEP was finalized and reviewed (as below). Blocking IP J also added as her thumb IP is strangely tight and seems dependent on position on MCP J- seems similar to an ORL or lateral band tightness issue in digits 2-5 and may be responsible for "popping" as well. Blocking AROM and continued stretches will help resolve this. She states no more significant functional problems other than intermittent issues washing hair and hands still- OT encourages her to watch her habits and forces on thumb. She has no concerns or questions at the end of session.   Exercises - Stretch Thumb into "C-Shape" (don't use other thumb)  - 1-2 x daily - 3-5 reps - 15 sec hold - Thumb AROM IP Blocking  - 1-2 x daily - 10-15 reps - 3 sec hold - Push against thumb in 2 Directions   - 1-2 x daily - 5-10 reps - 5 sec  hold - Full Fist  - 1-2 x daily - 5 reps     PATIENT EDUCATION: Education details: See tx section above for details  Person educated: Patient Education method: Verbal Instruction, Teach back, Handouts  Education comprehension: States and demonstrates understanding, Additional Education required      HOME EXERCISE PROGRAM: Access Code: [email or web address] URL: [email or web address]    GOALS: Goals reviewed with patient? Yes     SHORT TERM GOALS: (STG required if POC>30 days)   Pt will obtain protective, custom orthotic. Target date: 12/13/21 Goal status: 12/04/21: MET   2.  Pt will demo/state understanding of initial HEP to improve pain levels and prerequisite motion. Target date: 12/13/21 Goal status: 12/04/21: MET     LONG TERM GOALS:   Pt will improve functional ability by decreased impairment per Quick DASH assessment from 13% to 8% or better, for better quality of life. Target date: 12/27/21 Goal status: 12/18/21: MET 5%    2.  Pt will improve grip strength in right hand from painful 31 lbs to at least non-painful 35lbs for functional use at home and in  IADLs. Target date: 01/24/22 Goal status: 12/31/21: MET- 37#     3.  Pt will improve A/ROM in right wrist flexion  from 60 to at least 58, to have functional motion for tasks like reach and grasp.  Target date: 12/27/21 Goal status: 12/18/21:  MET   4.  Pt will improve strength in thumb ulnar deviation from 4/5 MMT to at least 4+/5 MMT to have increased functional ability to carry out selfcare and higher-level homecare tasks with no difficulty. Target date: 12/27/21 Goal status: 12/18/21: MET   5.  Pt will decrease pain at worst from 7/10 to 2/10 or better to have better sleep and occupational participation in daily roles. Target date: 01/24/22 Goal status: 12/31/21: MET      ASSESSMENT:   CLINICAL IMPRESSION: 12/31/21: She now has full thumb strength equal in b/l abduction and adduction, flexion and ext. No pain higher than 2/10 in past 2 weeks, less feeling of thumb instability (very infrequent now). She has orthotic brace if needed, a finalized home plan and recommendation to keep on 1-2 x day for 4 more weeks or until symptoms resolve completely. She will d/c to self-care now.     PLAN: OT FREQUENCY:  D/C   OT DURATION: D/C   PLANNED INTERVENTIONS: self care/ADL training, therapeutic exercise, therapeutic activity, manual therapy, passive range of motion, splinting, compression bandaging, moist heat, cryotherapy, contrast bath, patient/family education, coping strategies training, and DME and/or AE instructions   RECOMMENDED OTHER SERVICES: none now    CONSULTED AND AGREED WITH PLAN OF CARE: Patient   PLAN FOR NEXT SESSION:  N/A - successful D/C today.    Benito Mccreedy, OTR/L, CHT 12/31/2021, 4:03 PM    OCCUPATIONAL THERAPY DISCHARGE SUMMARY  Visits from Start of Care: 4  Current functional level related to goals / functional outcomes: Pt has met all goals to satisfactory levels and is pleased with outcomes.   Remaining deficits: Pt has no more significant functional  deficits or pain.   Education / Equipment: Pt has all needed materials and education. Pt understands how to continue on with self-management. See tx notes for more details.   Patient agrees to discharge due to max benefits received from outpatient occupational therapy / hand therapy at this time.   Benito Mccreedy, OTR/L, CHT 12/31/21

## 2022-01-02 ENCOUNTER — Other Ambulatory Visit: Payer: Self-pay | Admitting: Physician Assistant

## 2022-01-02 DIAGNOSIS — J4520 Mild intermittent asthma, uncomplicated: Secondary | ICD-10-CM

## 2022-01-02 DIAGNOSIS — J189 Pneumonia, unspecified organism: Secondary | ICD-10-CM

## 2022-01-14 ENCOUNTER — Ambulatory Visit: Payer: Medicare HMO | Admitting: Physician Assistant

## 2022-01-16 DIAGNOSIS — R051 Acute cough: Secondary | ICD-10-CM | POA: Diagnosis not present

## 2022-01-16 DIAGNOSIS — H10013 Acute follicular conjunctivitis, bilateral: Secondary | ICD-10-CM | POA: Diagnosis not present

## 2022-01-21 ENCOUNTER — Encounter: Payer: Self-pay | Admitting: Physician Assistant

## 2022-01-21 ENCOUNTER — Ambulatory Visit (INDEPENDENT_AMBULATORY_CARE_PROVIDER_SITE_OTHER): Payer: Medicare HMO | Admitting: Physician Assistant

## 2022-01-21 VITALS — BP 103/61 | HR 84 | Ht <= 58 in | Wt 127.0 lb

## 2022-01-21 DIAGNOSIS — J4520 Mild intermittent asthma, uncomplicated: Secondary | ICD-10-CM

## 2022-01-21 DIAGNOSIS — R062 Wheezing: Secondary | ICD-10-CM

## 2022-01-21 DIAGNOSIS — E118 Type 2 diabetes mellitus with unspecified complications: Secondary | ICD-10-CM | POA: Diagnosis not present

## 2022-01-21 DIAGNOSIS — J069 Acute upper respiratory infection, unspecified: Secondary | ICD-10-CM | POA: Diagnosis not present

## 2022-01-21 DIAGNOSIS — J189 Pneumonia, unspecified organism: Secondary | ICD-10-CM

## 2022-01-21 LAB — POCT GLYCOSYLATED HEMOGLOBIN (HGB A1C): Hemoglobin A1C: 6.8 % — AB (ref 4.0–5.6)

## 2022-01-21 MED ORDER — METHYLPREDNISOLONE SODIUM SUCC 125 MG IJ SOLR
125.0000 mg | Freq: Once | INTRAMUSCULAR | Status: AC
  Administered 2022-01-21: 125 mg via INTRAMUSCULAR

## 2022-01-21 MED ORDER — ALBUTEROL SULFATE HFA 108 (90 BASE) MCG/ACT IN AERS: 1.0000 | INHALATION_SPRAY | RESPIRATORY_TRACT | 2 refills | Status: DC | PRN

## 2022-01-21 MED ORDER — METFORMIN HCL ER 500 MG PO TB24: ORAL_TABLET | ORAL | 0 refills | Status: DC

## 2022-01-21 NOTE — Progress Notes (Addendum)
Established Patient Office Visit  Subjective   Patient ID: Leslie Roberson, female    DOB: February 08, 1956  Age: 66 y.o. MRN: [id number]  Chief Complaint  Patient presents with   Follow-up   Diabetes    HPI Pt is a 66 yo female with T2DM, GERD, Osteoporosis, HTN, CAD, OSA who presents to the clinic for 3 month follow up.   Pt is not checking sugars. No hypoglycemic symptoms. Taking metformin daily. She is staying active watching her grandchildren. No CP, palpitations, headaches. Not had to use any nitro.   She does not use CPAP due to claustrophobic issues.   She does have URI symptoms with dry cough and some chest tightness. She does not have rx for albuterol. No fever, chills, body aches. Her grandchildren are sick.  .. Active Ambulatory Problems    Diagnosis Date Noted   GERD (gastroesophageal reflux disease) 11/27/2015   Osteoporosis 11/27/2015   GAD (generalized anxiety disorder) 11/27/2015   MDD (major depressive disorder), recurrent, in full remission ([place]) 11/27/2015   Chronic dryness of both eyes 11/27/2015   Acquired hypothyroidism 11/27/2015   Asthma, chronic 11/27/2015   Insomnia 11/27/2015   Seborrheic keratoses 11/27/2015   Diverticulitis of colon 12/21/2015   Hip bursitis 12/25/2015   Biceps tendonitis on right 12/25/2015   Idiopathic scoliosis 01/22/2016   Vaginal atrophy 01/31/2016   Rosacea 02/04/2016   Right knee pain 02/19/2016   Paresthesia 04/03/2016   Facet hypertrophy of lumbosacral region 04/03/2016   Chronic back pain 04/03/2016   Bad taste in mouth 07/26/2016   No energy 09/15/2016   Lump in throat 01/27/2017   Dysphagia 01/27/2017   Chronic tension-type headache, intractable 02/05/2017   Prediabetes 02/05/2017   Right leg pain 02/06/2017   Atypical chest pain 05/24/2017   Itchy scalp 05/24/2017   Pneumothorax 06/25/2017   Angina, class III ([place]) 07/07/2017   CAD S/P percutaneous coronary angioplasty    Tongue mass 08/14/2017    Excessive sweating 09/22/2017   Rhinorrhea 01/04/2018   Arthralgia 02/05/2018   Nasal dryness 02/06/2018   Hyperlipidemia LDL goal <70 11/29/2018   DJD (degenerative joint disease), lumbosacral 11/29/2018   Pre-operative clearance 11/29/2018   Diabetes mellitus without complication ([place]) 41/28/7867   Migraine without aura and without status migrainosus, not intractable 02/08/2019   OAB (overactive bladder) 02/08/2019   Snoring 05/02/2019   Non-restorative sleep 05/02/2019   OSA on CPAP 05/20/2019   Ingrown right greater toenail 07/19/2019   Status post hip replacement, left 07/19/2019   Left hip pain 07/19/2019   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   Acute conjunctivitis of both eyes 01/23/2020   CAD in native artery 11/27/2015   S/P cardiac cath 07/07/2017   Paroxysmal nocturnal dyspnea 05/14/2020   Orthopnea 05/14/2020   Cough 05/18/2020   Controlled type 2 diabetes mellitus with complication, without long-term current use of insulin ([place]) 11/16/2020   Right hand pain 11/16/2020   Fall 11/16/2020   Bilateral hearing loss 04/05/2021   Impacted cerumen, left ear 04/05/2021   Ear fullness, left 04/08/2021   Mass of right axilla 05/15/2021   Closed dislocation of metacarpophalangeal joint of right thumb 11/18/2021   Community acquired pneumonia of left lower lobe of lung 12/11/2021   Resolved Ambulatory Problems    Diagnosis Date Noted   Acute medial meniscus tear 04/23/2016   Left knee pain 04/28/2016   Mild intermittent asthma without complication 67/20/9470   Past Medical History:  Diagnosis Date   Allergy 1992  Anxiety    Arthritis    Cataract 2021   Coronary artery disease    Depression    History of blood transfusion    Hypothyroidism    Pneumonia 2016   Pre-diabetes    Sleep apnea 2020   Thyroid disease      ROS   See HPI.  Objective:     BP 103/61   Pulse 84   Ht '4\' 10"'$  (1.473 m)   Wt 127 lb (57.6 kg)   SpO2 98%   BMI 26.54 kg/m   BP Readings from Last 3 Encounters:  01/21/22 103/61  12/24/21 111/75  12/11/21 107/61   Wt Readings from Last 3 Encounters:  01/21/22 127 lb (57.6 kg)  12/11/21 127 lb (57.6 kg)  11/26/21 124 lb (56.2 kg)    .Marland Kitchen Results for orders placed or performed in visit on 01/21/22  POCT glycosylated hemoglobin (Hb A1C)  Result Value Ref Range   Hemoglobin A1C 6.8 (A) 4.0 - 5.6 %   HbA1c POC (<> result, manual entry)     HbA1c, POC (prediabetic range)     HbA1c, POC (controlled diabetic range)       Physical Exam Constitutional:      Appearance: Normal appearance.  HENT:     Head: Normocephalic.     Nose: Nose normal. No rhinorrhea.     Mouth/Throat:     Mouth: Mucous membranes are moist.     Pharynx: No posterior oropharyngeal erythema.  Eyes:     Conjunctiva/sclera: Conjunctivae normal.  Neck:     Vascular: No carotid bruit.  Cardiovascular:     Rate and Rhythm: Normal rate and regular rhythm.     Pulses: Normal pulses.  Pulmonary:     Effort: Pulmonary effort is normal.     Breath sounds: Wheezing present.  Abdominal:     Palpations: Abdomen is soft.  Musculoskeletal:     Cervical back: Normal range of motion and neck supple. No rigidity or tenderness.     Right lower leg: No edema.     Left lower leg: No edema.  Lymphadenopathy:     Cervical: No cervical adenopathy.  Neurological:     General: No focal deficit present.     Mental Status: She is alert and oriented to person, place, and time.  Psychiatric:        Mood and Affect: Mood normal.         Assessment & Plan:  Marland KitchenMarland KitchenAlauna was seen today for follow-up and diabetes.  Diagnoses and all orders for this visit:  Viral URI with cough -     albuterol (VENTOLIN HFA) 108 (90 Base) MCG/ACT inhaler; Inhale 1-2 puffs into the lungs every 4 (four) hours as needed for wheezing or shortness of breath. -     methylPREDNISolone sodium succinate (SOLU-MEDROL) 125 mg/2 mL injection 125 mg  Controlled type 2 diabetes  mellitus with complication, without long-term current use of insulin ([facility]) -     POCT glycosylated hemoglobin (Hb A1C) -     metFORMIN (GLUCOPHAGE-XR) 500 MG 24 hr tablet; TAKE 1 TABLET EVERY DAY WITH BREAKFAST  Mild intermittent chronic asthma without complication -     albuterol (VENTOLIN HFA) 108 (90 Base) MCG/ACT inhaler; Inhale 1-2 puffs into the lungs every 4 (four) hours as needed for wheezing or shortness of breath. -     methylPREDNISolone sodium succinate (SOLU-MEDROL) 125 mg/2 mL injection 125 mg  Wheezing on auscultation -     albuterol (VENTOLIN HFA) 108 (  90 Base) MCG/ACT inhaler; Inhale 1-2 puffs into the lungs every 4 (four) hours as needed for wheezing or shortness of breath. -     methylPREDNISolone sodium succinate (SOLU-MEDROL) 125 mg/2 mL injection 125 mg   Wheezing heard on exam Albuterol refilled Shot of solumedrol given today Rest and hydrate Follow up as needed or if symptoms persist  A1C up from last check Continue metformin On statin BP to goal .. Diabetic Foot Exam - Simple   Simple Foot Form Diabetic Foot exam was performed with the following findings: Yes 01/21/2022  3:01 PM  Visual Inspection No deformities, no ulcerations, no other skin breakdown bilaterally: Yes Sensation Testing Intact to touch and monofilament testing bilaterally: Yes Pulse Check Posterior Tibialis and Dorsalis pulse intact bilaterally: Yes Comments    Eye exam UTD. Needs covid vaccine but not feeling well today Flu UTD. Follow up in 3 months.    Iran Planas, PA-C

## 2022-01-22 ENCOUNTER — Encounter: Payer: Self-pay | Admitting: Physician Assistant

## 2022-01-23 ENCOUNTER — Encounter: Payer: Self-pay | Admitting: Physician Assistant

## 2022-01-23 DIAGNOSIS — K2100 Gastro-esophageal reflux disease with esophagitis, without bleeding: Secondary | ICD-10-CM

## 2022-01-24 MED ORDER — PANTOPRAZOLE SODIUM 40 MG PO TBEC: 40.0000 mg | DELAYED_RELEASE_TABLET | Freq: Two times a day (BID) | ORAL | 3 refills | Status: DC

## 2022-01-27 ENCOUNTER — Encounter: Payer: Self-pay | Admitting: Physician Assistant

## 2022-01-28 ENCOUNTER — Encounter: Payer: Self-pay | Admitting: Physician Assistant

## 2022-01-31 ENCOUNTER — Encounter: Payer: Self-pay | Admitting: Physician Assistant

## 2022-01-31 ENCOUNTER — Ambulatory Visit (INDEPENDENT_AMBULATORY_CARE_PROVIDER_SITE_OTHER): Payer: Medicare HMO | Admitting: Physician Assistant

## 2022-01-31 DIAGNOSIS — J029 Acute pharyngitis, unspecified: Secondary | ICD-10-CM | POA: Diagnosis not present

## 2022-01-31 DIAGNOSIS — K148 Other diseases of tongue: Secondary | ICD-10-CM

## 2022-01-31 MED ORDER — IPRATROPIUM BROMIDE 0.03 % NA SOLN: 2.0000 | Freq: Two times a day (BID) | NASAL | 0 refills | Status: DC

## 2022-01-31 NOTE — Patient Instructions (Signed)
Zinc '50mg'$  and vitamin C '1000mg'$  a day can help immune symptoms Use humidifer in room  Use nasal spray before bed and in the morning

## 2022-01-31 NOTE — Progress Notes (Unsigned)
Acute Office Visit  Subjective:     Patient ID: Leslie Roberson, female    DOB: 1956/02/16, 66 y.o.   MRN: [id number]  Chief Complaint  Patient presents with   Sore Throat    HPI Patient is in today for sore throat for a last few nights that is worse in the morning, tongue mass that is tender and lymph nodes under chin. She did have covid vaccine on 27th of October before all this started. No fever, chills, SOB. She does have sinus drainage more the morning.   She has put up new air vents. No medications have changed.    .. Active Ambulatory Problems    Diagnosis Date Noted   GERD (gastroesophageal reflux disease) 11/27/2015   Osteoporosis 11/27/2015   GAD (generalized anxiety disorder) 11/27/2015   MDD (major depressive disorder), recurrent, in full remission ([place]) 11/27/2015   Chronic dryness of both eyes 11/27/2015   Acquired hypothyroidism 11/27/2015   Asthma, chronic 11/27/2015   Insomnia 11/27/2015   Seborrheic keratoses 11/27/2015   Diverticulitis of colon 12/21/2015   Hip bursitis 12/25/2015   Biceps tendonitis on right 12/25/2015   Idiopathic scoliosis 01/22/2016   Vaginal atrophy 01/31/2016   Rosacea 02/04/2016   Right knee pain 02/19/2016   Paresthesia 04/03/2016   Facet hypertrophy of lumbosacral region 04/03/2016   Chronic back pain 04/03/2016   Bad taste in mouth 07/26/2016   No energy 09/15/2016   Lump in throat 01/27/2017   Dysphagia 01/27/2017   Chronic tension-type headache, intractable 02/05/2017   Prediabetes 02/05/2017   Right leg pain 02/06/2017   Atypical chest pain 05/24/2017   Itchy scalp 05/24/2017   Pneumothorax 06/25/2017   Angina, class III ([place]) 07/07/2017   CAD S/P percutaneous coronary angioplasty    Tongue mass 08/14/2017   Excessive sweating 09/22/2017   Rhinorrhea 01/04/2018   Arthralgia 02/05/2018   Nasal dryness 02/06/2018   Hyperlipidemia LDL goal <70 11/29/2018   DJD (degenerative joint disease), lumbosacral  11/29/2018   Pre-operative clearance 11/29/2018   Diabetes mellitus without complication ([place]) 69/62/9528   Migraine without aura and without status migrainosus, not intractable 02/08/2019   OAB (overactive bladder) 02/08/2019   Snoring 05/02/2019   Non-restorative sleep 05/02/2019   OSA on CPAP 05/20/2019   Ingrown right greater toenail 07/19/2019   Status post hip replacement, left 07/19/2019   Left hip pain 07/19/2019   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   Acute conjunctivitis of both eyes 01/23/2020   CAD in native artery 11/27/2015   S/P cardiac cath 07/07/2017   Paroxysmal nocturnal dyspnea 05/14/2020   Orthopnea 05/14/2020   Cough 05/18/2020   Controlled type 2 diabetes mellitus with complication, without long-term current use of insulin ([place]) 11/16/2020   Right hand pain 11/16/2020   Fall 11/16/2020   Bilateral hearing loss 04/05/2021   Impacted cerumen, left ear 04/05/2021   Ear fullness, left 04/08/2021   Mass of right axilla 05/15/2021   Closed dislocation of metacarpophalangeal joint of right thumb 11/18/2021   Community acquired pneumonia of left lower lobe of lung 12/11/2021   Resolved Ambulatory Problems    Diagnosis Date Noted   Acute medial meniscus tear 04/23/2016   Left knee pain 04/28/2016   Mild intermittent asthma without complication 41/32/4401   Past Medical History:  Diagnosis Date   Allergy 1992   Anxiety    Arthritis    Cataract 2021   Coronary artery disease    Depression    History of blood  transfusion    Hypothyroidism    Pneumonia 2016   Pre-diabetes    Sleep apnea 2020   Thyroid disease       ROS See HPI.      Objective:    There were no vitals taken for this visit. BP Readings from Last 3 Encounters:  01/21/22 103/61  12/24/21 111/75  12/11/21 107/61   Wt Readings from Last 3 Encounters:  01/21/22 127 lb (57.6 kg)  12/11/21 127 lb (57.6 kg)  11/26/21 124 lb (56.2 kg)      Physical  Exam Constitutional:      Appearance: She is well-developed.  HENT:     Head: Normocephalic.     Right Ear: Tympanic membrane normal.     Left Ear: Tympanic membrane normal.     Nose: No congestion or rhinorrhea.     Mouth/Throat:     Mouth: Mucous membranes are moist. No oral lesions.     Pharynx: Posterior oropharyngeal erythema present. No oropharyngeal exudate or uvula swelling.     Tonsils: No tonsillar exudate or tonsillar abscesses.     Comments: Lower tip to the right small firm mass that was slightly tender palpated.  Eyes:     Conjunctiva/sclera: Conjunctivae normal.     Pupils: Pupils are equal, round, and reactive to light.  Neck:     Comments: Submandibular lymph nodes to palpation. Non tender  Cardiovascular:     Rate and Rhythm: Normal rate and regular rhythm.  Pulmonary:     Effort: Pulmonary effort is normal.  Musculoskeletal:     Cervical back: Normal range of motion.  Lymphadenopathy:     Cervical: Cervical adenopathy present.  Neurological:     Mental Status: She is alert.  Psychiatric:        Mood and Affect: Mood normal.          Assessment & Plan:  .Mckenzye was seen today for sore throat.  Diagnoses and all orders for this visit:  Sore throat -     ipratropium (ATROVENT) 0.03 % nasal spray; Place 2 sprays into both nostrils every 12 (twelve) hours.  Tongue mass   Unclear etiology of patients symptoms  Not concerned with submandibular lymph nodes Did feel tongue mass to palpation ? Etiology could lymph node Recheck in 4 weeks   Start atrovent nasal spray for any PND before bed and in the morning Use humidfer at night Start zinc and vitamin C for immune system  Follow up in 4 weeks to recheck tongue  Iran Planas, PA-C

## 2022-02-03 ENCOUNTER — Encounter: Payer: Self-pay | Admitting: Physician Assistant

## 2022-02-05 ENCOUNTER — Encounter: Payer: Self-pay | Admitting: Physician Assistant

## 2022-02-07 MED ORDER — AZITHROMYCIN 250 MG PO TABS: ORAL_TABLET | ORAL | 0 refills | Status: DC

## 2022-02-07 NOTE — Telephone Encounter (Signed)
RX sent

## 2022-02-14 ENCOUNTER — Ambulatory Visit (HOSPITAL_BASED_OUTPATIENT_CLINIC_OR_DEPARTMENT_OTHER): Payer: Medicare HMO | Admitting: Family

## 2022-02-18 ENCOUNTER — Ambulatory Visit (HOSPITAL_BASED_OUTPATIENT_CLINIC_OR_DEPARTMENT_OTHER): Payer: Medicare HMO | Admitting: Family

## 2022-02-18 ENCOUNTER — Encounter (HOSPITAL_BASED_OUTPATIENT_CLINIC_OR_DEPARTMENT_OTHER): Payer: Self-pay | Admitting: Family

## 2022-02-18 VITALS — BP 118/70 | HR 79 | Ht <= 58 in | Wt 126.0 lb

## 2022-02-18 DIAGNOSIS — E785 Hyperlipidemia, unspecified: Secondary | ICD-10-CM | POA: Diagnosis not present

## 2022-02-18 DIAGNOSIS — I25118 Atherosclerotic heart disease of native coronary artery with other forms of angina pectoris: Secondary | ICD-10-CM

## 2022-02-18 DIAGNOSIS — G4733 Obstructive sleep apnea (adult) (pediatric): Secondary | ICD-10-CM

## 2022-02-18 MED ORDER — METOPROLOL SUCCINATE ER 25 MG PO TB24: ORAL_TABLET | ORAL | 3 refills | Status: DC

## 2022-02-18 MED ORDER — ATORVASTATIN CALCIUM 20 MG PO TABS: 20.0000 mg | ORAL_TABLET | Freq: Every day | ORAL | 3 refills | Status: DC

## 2022-02-18 NOTE — Patient Instructions (Signed)
Medication Instructions:  Your Physician recommend you continue on your current medication as directed.    We have refilled your Atorvastatin and Metoprolol  *If you need a refill on your cardiac medications before your next appointment, please call your pharmacy*  Follow-Up: At [facility], you and your health needs are our priority.  As part of our continuing mission to provide you with exceptional heart care, we have created designated Provider Care Teams.  These Care Teams include your primary Cardiologist (physician) and Advanced Practice Providers (APPs -  Physician Assistants and Nurse Practitioners) who all work together to provide you with the care you need, when you need it.  We recommend signing up for the patient portal called "MyChart".  Sign up information is provided on this After Visit Summary.  MyChart is used to connect with patients for Virtual Visits (Telemedicine).  Patients are able to view lab/test results, encounter notes, upcoming appointments, etc.  Non-urgent messages can be sent to your provider as well.   To learn more about what you can do with MyChart, go to [email or web address].    Your next appointment:   Follow up as scheduled

## 2022-02-18 NOTE — Progress Notes (Unsigned)
Office Visit    Patient Name: Leslie Roberson Date of Encounter: 02/19/2022  PCP:  Breeback, Jade L, [place]  Cardiologist:  Kirk Ruths, MD  Advanced Practice Provider:  No care team member to display Electrophysiologist:  None      Chief Complaint    [place] is a 66 y.o. female presents today for follow up of CAD.  Past Medical History    Past Medical History:  Diagnosis Date   Allergy 1992   Anxiety    Arthritis    "right little finger; base of thumb left hand; spine" (07/07/2017)   Asthma, chronic 11/27/2015   Cataract 2021   Chronic back pain    Coronary artery disease    4/19 PCI/DEx2 to LAD, and PCI/DES x1 to RCA, normal EF   Depression    Diabetes mellitus without complication ([place]) 8242   Diverticulitis of colon 12/21/2015   Colonoscopy every 5 years/digestive health.    GERD (gastroesophageal reflux disease) 11/27/2015   History of blood transfusion    "related to spinal fusions"   Hypothyroidism    Idiopathic scoliosis 01/22/2016   Osteoporosis 11/27/2015   On prolia.    Pneumonia 2016   Pneumothorax 06/26/2017   Pre-diabetes    Sleep apnea 2020   Thyroid disease    Past Surgical History:  Procedure Laterality Date   ABDOMINAL HYSTERECTOMY     bladder mesh     S/P bladder sling"   COLON SURGERY  2007   CORONARY STENT INTERVENTION N/A 07/07/2017   Procedure: CORONARY STENT INTERVENTION;  Surgeon: Leonie Man, MD;  Location: [facility];  Service: Cardiovascular;  Laterality: N/A;   ELBOW SURGERY Right    "dislocation"   INCONTINENCE SURGERY     "didn't work"   INTRAVASCULAR PRESSURE WIRE/FFR STUDY N/A 07/07/2017   Procedure: INTRAVASCULAR PRESSURE WIRE/FFR STUDY;  Surgeon: Leonie Man, MD;  Location: [facility];  Service: Cardiovascular;  Laterality: N/A;   JOINT REPLACEMENT     LEFT HEART CATH AND CORONARY ANGIOGRAPHY N/A 07/07/2017   Procedure: LEFT HEART CATH AND  CORONARY ANGIOGRAPHY;  Surgeon: Leonie Man, MD;  Location: [facility];  Service: Cardiovascular;  Laterality: N/A;   SPINAL FUSION  ~ 1970 X 3   "below neck - lower back"   TONSILLECTOMY     TOTAL HIP ARTHROPLASTY Left     Allergies  Allergies  Allergen Reactions   Hydromorphone Rash   Levofloxacin Other (See Comments)    Hallucinations   Meperidine Nausea And Vomiting and Other (See Comments)    Also "doesn't work well for my pain"   Broccoli [Brassica Oleracea] Nausea Only and Other (See Comments)    Causes stomach pain also   Adhesive [Tape] Rash    History of Present Illness    Leslie Roberson is a 66 y.o. female with a hx of CAD s/p DES X2-LAD, DES-RCA 2019, hyperlipidemia, prediabetes, hypothyroidism, GERD, anxiety, depression last seen 11/05/21.  Initially evaluated in 2019 for chest pain with cardiac catheterization scheduled but subsequently presented with pneumothorax.  Underwent LHC 06/2017 m-dRCA 99%-0% s/p DES, oLAD 30%, mLAD 30%, pLAD-1 75%-0% s/p DES, pLAD-2 60%-0% s/p DES, EF 55-65%.  Echo 07/2020 ordered due to dyspnea with EF 60 to 35%, grade 1 diastolic dysfunction, no RWMA, no significant valvular abnormalities.    She was seen 4/12-23 by Diona Browner, NP doing overall well with no angina, dyspnea.  Noted her prior dyspnea was related to severe scoliosis. At clinic 11/05/21 she noted atypical chest pain and myoview ordered and performed which was low risk.   Presents today interdependently for follow up. Since last seen, has had some tightness but not lasting chest discomfort that is atypical. She is reassured by her myoview. Feeling overall well and recently enjoyed a trip to [place] to see her 52 month old twin grandsons. Reports no shortness of breath nor dyspnea on exertion. No edema, orthopnea, PND. Reports no palpitations.    EKGs/Labs/Other Studies Reviewed:   The following studies were reviewed today: Myoview 11/26/21   The study is normal. The study  is low risk.   No ST deviation was noted.   Left ventricular function is normal. End diastolic cavity size is normal.   Prior study not available for comparison.  EKG:  EKG is  ordered today.  The ekg ordered today demonstrates NSR 60 bpm  Recent Labs: 05/14/2021: Hemoglobin 14.4; Platelets 232; TSH 0.31 12/10/2021: ALT 27; BUN 17; Creat 0.90; Potassium 4.6; Sodium 143  Recent Lipid Panel    Component Value Date/Time   CHOL 140 11/25/2021 1033   TRIG 169 (H) 11/25/2021 1033   HDL 59 11/25/2021 1033   CHOLHDL 2.4 11/25/2021 1033   CHOLHDL 1.7 05/08/2020 0000   VLDL 29 12/28/2015 1056   LDLCALC 53 11/25/2021 1033   LDLCALC 41 05/08/2020 0000     Home Medications   Current Meds  Medication Sig   albuterol (VENTOLIN HFA) 108 (90 Base) MCG/ACT inhaler Inhale 1-2 puffs into the lungs every 4 (four) hours as needed for wheezing or shortness of breath.   aspirin EC 81 MG tablet Take 81 mg by mouth at bedtime.   azithromycin (ZITHROMAX Z-PAK) 250 MG tablet Take 2 tablets (500 mg) on  Day 1,  followed by 1 tablet (250 mg) once daily on Days 2 through 5.   busPIRone (BUSPAR) 15 MG tablet Take 60 mg by mouth daily.   calcium-vitamin D (OSCAL WITH D) 500-200 MG-UNIT tablet Take 1 tablet by mouth daily.   clonazePAM (KLONOPIN) 0.5 MG tablet Take 0.25 mg by mouth at bedtime.    denosumab (PROLIA) 60 MG/ML SOLN injection Inject 60 mg into the skin every 6 (six) months. Administer in upper arm, thigh, or abdomen   desvenlafaxine (PRISTIQ) 100 MG 24 hr tablet Take 100 mg by mouth in the morning.   gabapentin (NEURONTIN) 300 MG capsule Take 600 mg by mouth at bedtime.   ipratropium (ATROVENT) 0.03 % nasal spray Place 2 sprays into both nostrils every 12 (twelve) hours.   ketoconazole (NIZORAL) 2 % shampoo Apply topically.   levothyroxine (SYNTHROID) 137 MCG tablet Take 1 tablet (137 mcg total) by mouth 2 (two) times a week. Labs for refills   Menthol, Topical Analgesic, (MINERAL ICE EX) Apply 1  application to affected sites one to two times a day as needed (for pain)   metFORMIN (GLUCOPHAGE-XR) 500 MG 24 hr tablet TAKE 1 TABLET EVERY DAY WITH BREAKFAST   nitroGLYCERIN (NITROSTAT) 0.4 MG SL tablet DISSOLVE 1 TAB UNDER THE TONGUE FOR CHEST PAIN EVERY 5 MINUTES UP TO 3 DOSES IF PAIN PERSIST CALL 911/SEEK MEDICAL HELP   Omega-3 Fatty Acids (FISH OIL PO) Take 1 capsule by mouth daily.    oxybutynin (DITROPAN-XL) 5 MG 24 hr tablet Take 1 tablet (5 mg total) by mouth daily.   pantoprazole (PROTONIX) 40 MG tablet Take 1 tablet (40 mg total) by mouth 2 (two)  times daily before a meal.   REXULTI 1 MG TABS Take 1 tablet by mouth daily.    tolterodine (DETROL LA) 2 MG 24 hr capsule Take 2 mg by mouth in the morning.   traZODone (DESYREL) 150 MG tablet Take 150 mg by mouth at bedtime.   [DISCONTINUED] atorvastatin (LIPITOR) 20 MG tablet Take 1 tablet (20 mg total) by mouth daily.   [DISCONTINUED] busPIRone (BUSPAR) 15 MG tablet Take 1 tablet (15 mg total) by mouth daily. (Patient taking differently: Take 30 mg by mouth in the morning and at bedtime.)   [DISCONTINUED] gabapentin (NEURONTIN) 300 MG capsule TAKE 1 CAPSULE BY MOUTH IN THE MORNING AND 2 CAPSULES AT BEDTIME (Patient taking differently: Take 600 mg by mouth at bedtime.)   [DISCONTINUED] metoprolol succinate (TOPROL-XL) 25 MG 24 hr tablet TAKE 1 TABLET (25 MG TOTAL) BY MOUTH DAILY.     Review of Systems      All other systems reviewed and are otherwise negative except as noted above.  Physical Exam    VS:  BP 118/70   Pulse 79   Ht '4\' 10"'$  (1.473 m)   Wt 126 lb (57.2 kg)   BMI 26.33 kg/m  , BMI Body mass index is 26.33 kg/m.  Wt Readings from Last 3 Encounters:  02/18/22 126 lb (57.2 kg)  01/21/22 127 lb (57.6 kg)  12/11/21 127 lb (57.6 kg)     GEN: Well nourished, well developed, in no acute distress. HEENT: normal. Neck: Supple, no JVD, carotid bruits, or masses. Cardiac: RRR, no murmurs, rubs, or gallops. No clubbing,  cyanosis, edema.  Radials/PT 2+ and equal bilaterally.  Respiratory:  Respirations regular and unlabored, clear to auscultation bilaterally. GI: Soft, nontender, nondistended. MS: No deformity or atrophy. Skin: Warm and dry, no rash. Neuro:  Strength and sensation are intact. Psych: Normal affect.  Assessment & Plan     CAD- Low risk myoview 10/2021. Stable with no anginal symptoms. No indication for ischemic evaluation.   Continue GDMT Metoprolol, Atorvastatin, ASA. Refills provided. Heart healthy diet and regular cardiovascular exercise encouraged.    HLD, LDL goal is 70 - 11/25/21 LDL 53. Continue Atorvastatin.   DM2 - Continue to follow with PCP.  OSA - CPAP compliance encouraged.       Disposition: Follow up in 6 month(s) with Kirk Ruths, MD or APP.  Signed, Loel Dubonnet, NP 02/19/2022, 12:16 PM [place]

## 2022-02-19 ENCOUNTER — Encounter (HOSPITAL_BASED_OUTPATIENT_CLINIC_OR_DEPARTMENT_OTHER): Payer: Self-pay | Admitting: Family

## 2022-02-22 ENCOUNTER — Other Ambulatory Visit: Payer: Self-pay | Admitting: Physician Assistant

## 2022-02-22 DIAGNOSIS — J029 Acute pharyngitis, unspecified: Secondary | ICD-10-CM

## 2022-02-25 ENCOUNTER — Other Ambulatory Visit: Payer: Self-pay | Admitting: Physician Assistant

## 2022-02-25 DIAGNOSIS — Z1231 Encounter for screening mammogram for malignant neoplasm of breast: Secondary | ICD-10-CM

## 2022-02-28 DIAGNOSIS — M461 Sacroiliitis, not elsewhere classified: Secondary | ICD-10-CM | POA: Diagnosis not present

## 2022-02-28 DIAGNOSIS — M5416 Radiculopathy, lumbar region: Secondary | ICD-10-CM | POA: Diagnosis not present

## 2022-03-04 DIAGNOSIS — M9903 Segmental and somatic dysfunction of lumbar region: Secondary | ICD-10-CM | POA: Diagnosis not present

## 2022-03-04 DIAGNOSIS — M9905 Segmental and somatic dysfunction of pelvic region: Secondary | ICD-10-CM | POA: Diagnosis not present

## 2022-03-04 DIAGNOSIS — M9902 Segmental and somatic dysfunction of thoracic region: Secondary | ICD-10-CM | POA: Diagnosis not present

## 2022-03-04 DIAGNOSIS — M9901 Segmental and somatic dysfunction of cervical region: Secondary | ICD-10-CM | POA: Diagnosis not present

## 2022-03-07 DIAGNOSIS — J988 Other specified respiratory disorders: Secondary | ICD-10-CM | POA: Diagnosis not present

## 2022-03-07 DIAGNOSIS — U071 COVID-19: Secondary | ICD-10-CM | POA: Diagnosis not present

## 2022-03-21 DIAGNOSIS — E119 Type 2 diabetes mellitus without complications: Secondary | ICD-10-CM | POA: Diagnosis not present

## 2022-03-21 DIAGNOSIS — M461 Sacroiliitis, not elsewhere classified: Secondary | ICD-10-CM | POA: Diagnosis not present

## 2022-03-21 DIAGNOSIS — I2510 Atherosclerotic heart disease of native coronary artery without angina pectoris: Secondary | ICD-10-CM | POA: Diagnosis not present

## 2022-03-21 DIAGNOSIS — Z7984 Long term (current) use of oral hypoglycemic drugs: Secondary | ICD-10-CM | POA: Diagnosis not present

## 2022-03-21 DIAGNOSIS — Z01812 Encounter for preprocedural laboratory examination: Secondary | ICD-10-CM | POA: Diagnosis not present

## 2022-03-21 DIAGNOSIS — Z955 Presence of coronary angioplasty implant and graft: Secondary | ICD-10-CM | POA: Diagnosis not present

## 2022-03-21 DIAGNOSIS — G4730 Sleep apnea, unspecified: Secondary | ICD-10-CM | POA: Diagnosis not present

## 2022-03-27 DIAGNOSIS — F419 Anxiety disorder, unspecified: Secondary | ICD-10-CM | POA: Diagnosis not present

## 2022-03-27 DIAGNOSIS — I10 Essential (primary) hypertension: Secondary | ICD-10-CM | POA: Diagnosis not present

## 2022-03-27 DIAGNOSIS — Z79899 Other long term (current) drug therapy: Secondary | ICD-10-CM | POA: Diagnosis not present

## 2022-03-27 DIAGNOSIS — Z981 Arthrodesis status: Secondary | ICD-10-CM | POA: Diagnosis not present

## 2022-03-27 DIAGNOSIS — M47812 Spondylosis without myelopathy or radiculopathy, cervical region: Secondary | ICD-10-CM | POA: Diagnosis not present

## 2022-03-27 DIAGNOSIS — E119 Type 2 diabetes mellitus without complications: Secondary | ICD-10-CM | POA: Diagnosis not present

## 2022-03-27 DIAGNOSIS — M461 Sacroiliitis, not elsewhere classified: Secondary | ICD-10-CM | POA: Diagnosis not present

## 2022-03-27 DIAGNOSIS — Z7982 Long term (current) use of aspirin: Secondary | ICD-10-CM | POA: Diagnosis not present

## 2022-03-27 DIAGNOSIS — I2510 Atherosclerotic heart disease of native coronary artery without angina pectoris: Secondary | ICD-10-CM | POA: Diagnosis not present

## 2022-03-27 DIAGNOSIS — M5136 Other intervertebral disc degeneration, lumbar region: Secondary | ICD-10-CM | POA: Diagnosis not present

## 2022-03-27 DIAGNOSIS — K219 Gastro-esophageal reflux disease without esophagitis: Secondary | ICD-10-CM | POA: Diagnosis not present

## 2022-03-27 DIAGNOSIS — E039 Hypothyroidism, unspecified: Secondary | ICD-10-CM | POA: Diagnosis not present

## 2022-03-27 DIAGNOSIS — M532X8 Spinal instabilities, sacral and sacrococcygeal region: Secondary | ICD-10-CM | POA: Diagnosis not present

## 2022-04-01 ENCOUNTER — Telehealth: Payer: Self-pay

## 2022-04-01 NOTE — Patient Outreach (Signed)
  Care Coordination [facility] Note Transition Care Management Follow-up Telephone Call Date of discharge and from where: 03/29/22 [facility] dx s/p rt sacroiliac joint fussion How have you been since you were released from the hospital? I am feeling OK.  I have called the urologist about getting an appointment to get my urinary catheter removed  Any questions or concerns? No  Items Reviewed: Did the pt receive and understand the discharge instructions provided? Yes  Medications obtained and verified? Yes  Other? Yes  catheter care Any new allergies since your discharge? No  Dietary orders reviewed? Yes Do you have support at home? Yes   Home Care and Equipment/Supplies: Were home health services ordered? no If so, what is the name of the agency? N/a  Has the agency set up a time to come to the patient's home? not applicable Were any new equipment or medical supplies ordered?  No What is the name of the medical supply agency? N/a Were you able to get the supplies/equipment? not applicable Do you have any questions related to the use of the equipment or supplies? No  Functional Questionnaire: (I = Independent and D = Dependent) ADLs: I husband assists as needed  Bathing/Dressing- I  Meal Prep- I  Eating- I  Maintaining continence- I  Transferring/Ambulation- I  Managing Meds- I  Follow up appointments reviewed:  PCP Hospital f/u appt confirmed? No  Scheduled to see Bubba Camp on 04/23/22 @ [facility] f/u appt confirmed? Yes  Scheduled to see Dr.Cohen on 1/23 @ . Has called Urology to schedule follow up in one week Are transportation arrangements needed? Yes  If their condition worsens, is the pt aware to call PCP or go to the Emergency Dept.? Yes Was the patient provided with contact information for the PCP's office or ED? Yes Was to pt encouraged to call back with questions or concerns? Yes  SDOH assessments and interventions completed:    Yes SDOH Interventions Today    Flowsheet Row Most Recent Value  SDOH Interventions   Food Insecurity Interventions Intervention Not Indicated       Care Coordination Interventions:  Reviewed foley catheter care and when to call provider    Encounter Outcome:  Pt. Visit Completed   Peter Garter RN, BSN,CCM, CDE Care Management Coordinator [facility] [phone]

## 2022-04-02 ENCOUNTER — Other Ambulatory Visit: Payer: Self-pay | Admitting: Physician Assistant

## 2022-04-02 DIAGNOSIS — R8290 Unspecified abnormal findings in urine: Secondary | ICD-10-CM | POA: Diagnosis not present

## 2022-04-02 DIAGNOSIS — R339 Retention of urine, unspecified: Secondary | ICD-10-CM | POA: Diagnosis not present

## 2022-04-04 DIAGNOSIS — R339 Retention of urine, unspecified: Secondary | ICD-10-CM | POA: Diagnosis not present

## 2022-04-04 DIAGNOSIS — R8290 Unspecified abnormal findings in urine: Secondary | ICD-10-CM | POA: Diagnosis not present

## 2022-04-11 DIAGNOSIS — M461 Sacroiliitis, not elsewhere classified: Secondary | ICD-10-CM | POA: Diagnosis not present

## 2022-04-16 ENCOUNTER — Other Ambulatory Visit: Payer: Self-pay | Admitting: *Deleted

## 2022-04-16 ENCOUNTER — Other Ambulatory Visit: Payer: Self-pay | Admitting: Physician Assistant

## 2022-04-16 ENCOUNTER — Encounter: Payer: Self-pay | Admitting: Physician Assistant

## 2022-04-16 ENCOUNTER — Ambulatory Visit (INDEPENDENT_AMBULATORY_CARE_PROVIDER_SITE_OTHER): Payer: Medicare HMO

## 2022-04-16 DIAGNOSIS — Z1231 Encounter for screening mammogram for malignant neoplasm of breast: Secondary | ICD-10-CM | POA: Diagnosis not present

## 2022-04-16 MED ORDER — LEVOTHYROXINE SODIUM 137 MCG PO TABS: ORAL_TABLET | ORAL | 0 refills | Status: DC

## 2022-04-18 NOTE — Progress Notes (Signed)
Normal mammogram. Follow up in 1 year.

## 2022-04-23 ENCOUNTER — Ambulatory Visit (INDEPENDENT_AMBULATORY_CARE_PROVIDER_SITE_OTHER): Payer: Medicare HMO | Admitting: Physician Assistant

## 2022-04-23 ENCOUNTER — Encounter: Payer: Self-pay | Admitting: Physician Assistant

## 2022-04-23 VITALS — BP 109/64 | HR 73 | Ht <= 58 in | Wt 124.1 lb

## 2022-04-23 DIAGNOSIS — E039 Hypothyroidism, unspecified: Secondary | ICD-10-CM

## 2022-04-23 DIAGNOSIS — E118 Type 2 diabetes mellitus with unspecified complications: Secondary | ICD-10-CM | POA: Diagnosis not present

## 2022-04-23 MED ORDER — LEVOTHYROXINE SODIUM 137 MCG PO TABS: ORAL_TABLET | ORAL | 1 refills | Status: DC

## 2022-04-23 NOTE — Progress Notes (Unsigned)
Established Patient Office Visit  Subjective   Patient ID: Leslie Roberson, female    DOB: 1955/10/30  Age: 67 y.o. MRN: [id number]  Chief Complaint  Patient presents with   Follow-up    Diabetes     HPI Pt is a 67 yo female with OSA, T2DM, Asthma, CAD, hypothyroidism who presents to the clinic for follow up and medication refills.   Pt did have covid in early December and took paxlovid. She has recovered with no lingering side effects.   Pt is doing well on medications. She is not checking her sugars. She denies any hypoglycemic events. No open wounds. She denies any CP, palpitations, headaches or vision changes.   .. Active Ambulatory Problems    Diagnosis Date Noted   GERD (gastroesophageal reflux disease) 11/27/2015   Osteoporosis 11/27/2015   GAD (generalized anxiety disorder) 11/27/2015   MDD (major depressive disorder), recurrent, in full remission ([place]) 11/27/2015   Chronic dryness of both eyes 11/27/2015   Acquired hypothyroidism 11/27/2015   Asthma, chronic 11/27/2015   Insomnia 11/27/2015   Seborrheic keratoses 11/27/2015   Diverticulitis of colon 12/21/2015   Hip bursitis 12/25/2015   Biceps tendonitis on right 12/25/2015   Idiopathic scoliosis 01/22/2016   Vaginal atrophy 01/31/2016   Rosacea 02/04/2016   Right knee pain 02/19/2016   Paresthesia 04/03/2016   Facet hypertrophy of lumbosacral region 04/03/2016   Chronic back pain 04/03/2016   Bad taste in mouth 07/26/2016   No energy 09/15/2016   Lump in throat 01/27/2017   Dysphagia 01/27/2017   Chronic tension-type headache, intractable 02/05/2017   Prediabetes 02/05/2017   Right leg pain 02/06/2017   Atypical chest pain 05/24/2017   Itchy scalp 05/24/2017   Pneumothorax 06/25/2017   Angina, class III ([place]) 07/07/2017   CAD S/P percutaneous coronary angioplasty    Tongue mass 08/14/2017   Excessive sweating 09/22/2017   Rhinorrhea 01/04/2018   Arthralgia 02/05/2018   Nasal dryness 02/06/2018    Hyperlipidemia LDL goal <70 11/29/2018   DJD (degenerative joint disease), lumbosacral 11/29/2018   Pre-operative clearance 11/29/2018   Diabetes mellitus without complication ([place]) 67/34/1937   Migraine without aura and without status migrainosus, not intractable 02/08/2019   OAB (overactive bladder) 02/08/2019   Snoring 05/02/2019   Non-restorative sleep 05/02/2019   OSA on CPAP 05/20/2019   Ingrown right greater toenail 07/19/2019   Status post hip replacement, left 07/19/2019   Left hip pain 07/19/2019   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   Acute conjunctivitis of both eyes 01/23/2020   CAD in native artery 11/27/2015   S/P cardiac cath 07/07/2017   Paroxysmal nocturnal dyspnea 05/14/2020   Orthopnea 05/14/2020   Cough 05/18/2020   Controlled type 2 diabetes mellitus with complication, without long-term current use of insulin ([place]) 11/16/2020   Right hand pain 11/16/2020   Fall 11/16/2020   Bilateral hearing loss 04/05/2021   Impacted cerumen, left ear 04/05/2021   Ear fullness, left 04/08/2021   Mass of right axilla 05/15/2021   Closed dislocation of metacarpophalangeal joint of right thumb 11/18/2021   Community acquired pneumonia of left lower lobe of lung 12/11/2021   Resolved Ambulatory Problems    Diagnosis Date Noted   Acute medial meniscus tear 04/23/2016   Left knee pain 04/28/2016   Mild intermittent asthma without complication 90/24/0973   Past Medical History:  Diagnosis Date   Allergy 1992   Anxiety    Arthritis    Cataract 2021   Coronary artery disease  Depression    History of blood transfusion    Hypothyroidism    Pneumonia 2016   Pre-diabetes    Sleep apnea 2020   Thyroid disease      ROS See HPI.    Objective:     BP 109/64 (BP Location: Left Arm, Patient Position: Sitting, Cuff Size: Small)   Pulse 73   Ht '4\' 10"'$  (1.473 m)   Wt 124 lb 1.3 oz (56.3 kg)   SpO2 97%   BMI 25.93 kg/m  BP Readings from Last 3  Encounters:  04/23/22 109/64  02/18/22 118/70  01/21/22 103/61   Wt Readings from Last 3 Encounters:  04/23/22 124 lb 1.3 oz (56.3 kg)  02/18/22 126 lb (57.2 kg)  01/21/22 127 lb (57.6 kg)    .Marland Kitchen Lab Results  Component Value Date   HGBA1C 6.7 (H) 04/23/2022     Physical Exam Constitutional:      Appearance: Normal appearance.  HENT:     Head: Normocephalic.  Cardiovascular:     Rate and Rhythm: Normal rate and regular rhythm.  Pulmonary:     Effort: Pulmonary effort is normal.     Breath sounds: Normal breath sounds.  Musculoskeletal:     Right lower leg: No edema.     Left lower leg: No edema.  Neurological:     General: No focal deficit present.     Mental Status: She is alert and oriented to person, place, and time.  Psychiatric:        Mood and Affect: Mood normal.           Assessment & Plan:  Marland KitchenMarland KitchenMonet was seen today for follow-up.  Diagnoses and all orders for this visit:  Controlled type 2 diabetes mellitus with complication, without long-term current use of insulin (HCC) -     Hemoglobin A1c  Acquired hypothyroidism -     TSH -     levothyroxine (SYNTHROID) 137 MCG tablet; TAKE 1 TABLET (137 MCG TOTAL) once a week then 1/2 tablet all other days.   A1C machine is down today Ordered TSH/A1C in labs and will adjust medication accordingly Next prolia is march in 2 months     Iran Planas, PA-C

## 2022-04-24 LAB — HEMOGLOBIN A1C
Hgb A1c MFr Bld: 6.7 % of total Hgb — ABNORMAL HIGH (ref <5.7)
Mean Plasma Glucose: 146 mg/dL
eAG (mmol/L): 8.1 mmol/L

## 2022-04-24 LAB — TSH: TSH: 0.43 mIU/L (ref 0.40–4.50)

## 2022-04-25 ENCOUNTER — Encounter: Payer: Self-pay | Admitting: Physician Assistant

## 2022-04-25 ENCOUNTER — Other Ambulatory Visit: Payer: Self-pay | Admitting: Physician Assistant

## 2022-04-25 DIAGNOSIS — E118 Type 2 diabetes mellitus with unspecified complications: Secondary | ICD-10-CM

## 2022-04-25 MED ORDER — METFORMIN HCL ER 500 MG PO TB24: ORAL_TABLET | ORAL | 3 refills | Status: DC

## 2022-04-25 NOTE — Progress Notes (Signed)
I sent metformin to [facility]. Did you want that sent [facility]?   TSH in normal range.  A1C improved minimally and under 7.

## 2022-04-28 ENCOUNTER — Encounter: Payer: Self-pay | Admitting: Physician Assistant

## 2022-04-28 DIAGNOSIS — F333 Major depressive disorder, recurrent, severe with psychotic symptoms: Secondary | ICD-10-CM | POA: Diagnosis not present

## 2022-04-29 ENCOUNTER — Encounter: Payer: Self-pay | Admitting: Physician Assistant

## 2022-04-29 DIAGNOSIS — E119 Type 2 diabetes mellitus without complications: Secondary | ICD-10-CM | POA: Diagnosis not present

## 2022-04-29 DIAGNOSIS — H52223 Regular astigmatism, bilateral: Secondary | ICD-10-CM | POA: Diagnosis not present

## 2022-04-29 DIAGNOSIS — H04123 Dry eye syndrome of bilateral lacrimal glands: Secondary | ICD-10-CM | POA: Diagnosis not present

## 2022-04-29 DIAGNOSIS — H524 Presbyopia: Secondary | ICD-10-CM | POA: Diagnosis not present

## 2022-04-29 DIAGNOSIS — H2513 Age-related nuclear cataract, bilateral: Secondary | ICD-10-CM | POA: Diagnosis not present

## 2022-04-29 DIAGNOSIS — H5213 Myopia, bilateral: Secondary | ICD-10-CM | POA: Diagnosis not present

## 2022-04-29 DIAGNOSIS — H35033 Hypertensive retinopathy, bilateral: Secondary | ICD-10-CM | POA: Diagnosis not present

## 2022-04-29 DIAGNOSIS — Z79899 Other long term (current) drug therapy: Secondary | ICD-10-CM | POA: Diagnosis not present

## 2022-04-29 LAB — HM DIABETES EYE EXAM

## 2022-05-01 ENCOUNTER — Encounter: Payer: Self-pay | Admitting: Physician Assistant

## 2022-05-12 DIAGNOSIS — Z1330 Encounter for screening examination for mental health and behavioral disorders, unspecified: Secondary | ICD-10-CM | POA: Diagnosis not present

## 2022-05-12 DIAGNOSIS — N3281 Overactive bladder: Secondary | ICD-10-CM | POA: Diagnosis not present

## 2022-05-26 DIAGNOSIS — F333 Major depressive disorder, recurrent, severe with psychotic symptoms: Secondary | ICD-10-CM | POA: Diagnosis not present

## 2022-06-04 ENCOUNTER — Encounter: Payer: Self-pay | Admitting: Physician Assistant

## 2022-06-04 ENCOUNTER — Ambulatory Visit (INDEPENDENT_AMBULATORY_CARE_PROVIDER_SITE_OTHER): Payer: Medicare HMO | Admitting: Physician Assistant

## 2022-06-04 VITALS — BP 120/62 | HR 94 | Ht <= 58 in | Wt 125.0 lb

## 2022-06-04 DIAGNOSIS — H938X2 Other specified disorders of left ear: Secondary | ICD-10-CM

## 2022-06-04 DIAGNOSIS — H9042 Sensorineural hearing loss, unilateral, left ear, with unrestricted hearing on the contralateral side: Secondary | ICD-10-CM

## 2022-06-04 DIAGNOSIS — H9192 Unspecified hearing loss, left ear: Secondary | ICD-10-CM

## 2022-06-04 NOTE — Progress Notes (Unsigned)
Acute Office Visit  Subjective:     Patient ID: Leslie Roberson, female    DOB: [birth year], 67 y.o.   MRN: [id number]  Chief Complaint  Patient presents with   Ear Problem    HPI Patient is in today for left ear fullness and hearing loss. Pt has known left ear hearing loss but seems to be worse over the last 3 days. No fever, chills, sinus pressure, ST, headache. Pt does have a history of cerumen impaction and needing irrigation. She wonders if she needs irrigation today. She is supposed to fly in a plane in next 2 weeks and wants this addressed.    Active Ambulatory Problems    Diagnosis Date Noted   GERD (gastroesophageal reflux disease) 11/27/2015   Osteoporosis 11/27/2015   GAD (generalized anxiety disorder) 11/27/2015   MDD (major depressive disorder), recurrent, in full remission ([place]) 11/27/2015   Chronic dryness of both eyes 11/27/2015   Acquired hypothyroidism 11/27/2015   Asthma, chronic 11/27/2015   Insomnia 11/27/2015   Seborrheic keratoses 11/27/2015   Diverticulitis of colon 12/21/2015   Hip bursitis 12/25/2015   Biceps tendonitis on right 12/25/2015   Idiopathic scoliosis 01/22/2016   Vaginal atrophy 01/31/2016   Rosacea 02/04/2016   Right knee pain 02/19/2016   Paresthesia 04/03/2016   Facet hypertrophy of lumbosacral region 04/03/2016   Chronic back pain 04/03/2016   Bad taste in mouth 07/26/2016   No energy 09/15/2016   Lump in throat 01/27/2017   Dysphagia 01/27/2017   Chronic tension-type headache, intractable 02/05/2017   Prediabetes 02/05/2017   Right leg pain 02/06/2017   Atypical chest pain 05/24/2017   Itchy scalp 05/24/2017   Pneumothorax 06/25/2017   Angina, class III ([place]) 07/07/2017   CAD S/P percutaneous coronary angioplasty    Tongue mass 08/14/2017   Excessive sweating 09/22/2017   Rhinorrhea 01/04/2018   Arthralgia 02/05/2018   Nasal dryness 02/06/2018   Hyperlipidemia LDL goal <70 11/29/2018   DJD (degenerative joint  disease), lumbosacral 11/29/2018   Pre-operative clearance 11/29/2018   Diabetes mellitus without complication ([place]) [id number]   Migraine without aura and without status migrainosus, not intractable 02/08/2019   OAB (overactive bladder) 02/08/2019   Snoring 05/02/2019   Non-restorative sleep 05/02/2019   OSA on CPAP 05/20/2019   Ingrown right greater toenail 07/19/2019   Status post hip replacement, left 07/19/2019   Left hip pain 07/19/2019   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   Acute conjunctivitis of both eyes 01/23/2020   CAD in native artery 11/27/2015   S/P cardiac cath 07/07/2017   Paroxysmal nocturnal dyspnea 05/14/2020   Orthopnea 05/14/2020   Cough 05/18/2020   Controlled type 2 diabetes mellitus with complication, without long-term current use of insulin ([place]) 11/16/2020   Right hand pain 11/16/2020   Fall 11/16/2020   Bilateral hearing loss 04/05/2021   Impacted cerumen, left ear 04/05/2021   Ear fullness, left 04/08/2021   Mass of right axilla 05/15/2021   Closed dislocation of metacarpophalangeal joint of right thumb 11/18/2021   Community acquired pneumonia of left lower lobe of lung 12/11/2021   Resolved Ambulatory Problems    Diagnosis Date Noted   Acute medial meniscus tear 04/23/2016   Left knee pain 04/28/2016   Mild intermittent asthma without complication [id number]   Past Medical History:  Diagnosis Date   Allergy 1992   Anxiety    Arthritis    Cataract 2021   Coronary artery disease    Depression    History  of blood transfusion    Hypothyroidism    Pneumonia 2016   Pre-diabetes    Sleep apnea 2020   Thyroid disease      ROS  See HPI.     Objective:    There were no vitals taken for this visit. BP Readings from Last 3 Encounters:  06/04/22 120/62  04/23/22 109/64  02/18/22 118/70   Wt Readings from Last 3 Encounters:  06/04/22 125 lb (56.7 kg)  04/23/22 124 lb 1.3 oz (56.3 kg)  02/18/22 126 lb (57.2 kg)       Physical Exam  Left ear:  No pain with pulling up on tragus.  Canal with scant scaly debri Not able to visulize the TM appears dark but ? TM perforation No canal erythema Gross hearing loss in left ear  Attempted irrigation with no significant debri, stopped due to discomfort      Assessment & Plan:  Marland KitchenMarland KitchenNaydene was seen today for ear problem.  Diagnoses and all orders for this visit:  Ear fullness, left -     Ambulatory referral to ENT  Sensorineural hearing loss (SNHL) of left ear with unrestricted hearing of right ear -     Ambulatory referral to ENT   I really had a lot of trouble getting a good view of her TM. I saw some what I thought at first was scaly debri in external left canal but could not rule out a perforation. I would like for patient to see ENT to get a better look and make sure no perforation. She does have some upcoming travel. She has ENT but cannot get in until April.   Iran Planas, PA-C

## 2022-06-04 NOTE — Patient Instructions (Addendum)
Will call [facility] and see when we can get you in sooner.

## 2022-06-05 ENCOUNTER — Encounter: Payer: Self-pay | Admitting: Physician Assistant

## 2022-06-05 DIAGNOSIS — H903 Sensorineural hearing loss, bilateral: Secondary | ICD-10-CM | POA: Diagnosis not present

## 2022-06-05 DIAGNOSIS — H6993 Unspecified Eustachian tube disorder, bilateral: Secondary | ICD-10-CM | POA: Diagnosis not present

## 2022-06-11 DIAGNOSIS — Z129 Encounter for screening for malignant neoplasm, site unspecified: Secondary | ICD-10-CM | POA: Diagnosis not present

## 2022-06-11 DIAGNOSIS — L821 Other seborrheic keratosis: Secondary | ICD-10-CM | POA: Diagnosis not present

## 2022-06-11 DIAGNOSIS — D225 Melanocytic nevi of trunk: Secondary | ICD-10-CM | POA: Diagnosis not present

## 2022-06-11 DIAGNOSIS — I781 Nevus, non-neoplastic: Secondary | ICD-10-CM | POA: Diagnosis not present

## 2022-06-24 ENCOUNTER — Telehealth: Payer: Self-pay

## 2022-06-24 ENCOUNTER — Other Ambulatory Visit: Payer: Self-pay

## 2022-06-24 DIAGNOSIS — M810 Age-related osteoporosis without current pathological fracture: Secondary | ICD-10-CM

## 2022-06-24 NOTE — Telephone Encounter (Signed)
Prior authorization has been approved.  Authorization number [id number] 06/06/21 - 03/31/2023  Ordered lab work. Patient scheduled.

## 2022-06-25 DIAGNOSIS — Z6826 Body mass index (BMI) 26.0-26.9, adult: Secondary | ICD-10-CM | POA: Diagnosis not present

## 2022-06-25 DIAGNOSIS — M7061 Trochanteric bursitis, right hip: Secondary | ICD-10-CM | POA: Diagnosis not present

## 2022-06-25 DIAGNOSIS — M5416 Radiculopathy, lumbar region: Secondary | ICD-10-CM | POA: Diagnosis not present

## 2022-06-25 DIAGNOSIS — M461 Sacroiliitis, not elsewhere classified: Secondary | ICD-10-CM | POA: Diagnosis not present

## 2022-06-25 DIAGNOSIS — M25551 Pain in right hip: Secondary | ICD-10-CM | POA: Diagnosis not present

## 2022-06-26 DIAGNOSIS — M810 Age-related osteoporosis without current pathological fracture: Secondary | ICD-10-CM | POA: Diagnosis not present

## 2022-06-27 LAB — COMPLETE METABOLIC PANEL WITH GFR
AG Ratio: 1.6 (calc) (ref 1.0–2.5)
ALT: 47 U/L — ABNORMAL HIGH (ref 6–29)
AST: 31 U/L (ref 10–35)
Albumin: 4.1 g/dL (ref 3.6–5.1)
Alkaline phosphatase (APISO): 79 U/L (ref 37–153)
BUN: 18 mg/dL (ref 7–25)
CO2: 28 mmol/L (ref 20–32)
Calcium: 9.9 mg/dL (ref 8.6–10.4)
Chloride: 106 mmol/L (ref 98–110)
Creat: 0.9 mg/dL (ref 0.50–1.05)
Globulin: 2.5 g/dL (calc) (ref 1.9–3.7)
Glucose, Bld: 83 mg/dL (ref 65–99)
Potassium: 4.4 mmol/L (ref 3.5–5.3)
Sodium: 143 mmol/L (ref 135–146)
Total Bilirubin: 0.3 mg/dL (ref 0.2–1.2)
Total Protein: 6.6 g/dL (ref 6.1–8.1)
eGFR: 71 mL/min/{1.73_m2} (ref >=60)

## 2022-06-30 NOTE — Progress Notes (Signed)
Good for prolia.  ALT is up some. Recheck in 2 weeks avoid any alcohol or tylenol products.

## 2022-07-01 ENCOUNTER — Ambulatory Visit (INDEPENDENT_AMBULATORY_CARE_PROVIDER_SITE_OTHER): Payer: Medicare HMO | Admitting: Family Medicine

## 2022-07-01 ENCOUNTER — Encounter: Payer: Self-pay | Admitting: Physician Assistant

## 2022-07-01 VITALS — BP 109/57 | HR 81 | Ht <= 58 in

## 2022-07-01 DIAGNOSIS — R748 Abnormal levels of other serum enzymes: Secondary | ICD-10-CM

## 2022-07-01 DIAGNOSIS — M810 Age-related osteoporosis without current pathological fracture: Secondary | ICD-10-CM

## 2022-07-01 MED ORDER — DENOSUMAB 60 MG/ML ~~LOC~~ SOSY
60.0000 mg | PREFILLED_SYRINGE | Freq: Once | SUBCUTANEOUS | Status: AC
  Administered 2022-07-01: 60 mg via SUBCUTANEOUS

## 2022-07-01 NOTE — Progress Notes (Signed)
   Established Patient Office Visit  Subjective   Patient ID: Leslie Roberson, female    DOB: April 28, 1955  Age: 67 y.o. MRN: [id number]  Chief Complaint  Patient presents with   Osteoporosis    Prolia injection- nurse visit.     HPI  Osteoporosis - Prolia injection - nurse visit. Patient states she is currently taking calcium and Vit D combination supplement-  patient lab work was normal for calcium level and kidney function.   ROS    Objective:     BP (!) 109/57   Pulse 81   Ht 4\' 10"  (1.473 m)   SpO2 97%   BMI 26.13 kg/m    Physical Exam   No results found for any visits on 07/01/22.    The 10-year ASCVD risk score (Arnett DK, et al., 2019) is: 9.4%    Assessment & Plan:  Prolia injection- nurse visit-  admin 60mg  Prolia SQ left arm . Patient tolerated injection well without complications. Patient advised to schedule next injection  6 months and one day from this injection and return at least one week before for lab work.  Problem List Items Addressed This Visit   None   No follow-ups on file.    Rae Lips, LPN

## 2022-07-01 NOTE — Progress Notes (Signed)
Process-agree with note below.  Labs are up-to-date.

## 2022-07-01 NOTE — Patient Instructions (Signed)
Return in 6 months and one day for next prolia injection. Return at least one week before injection for lab work .

## 2022-07-08 ENCOUNTER — Other Ambulatory Visit: Payer: Self-pay | Admitting: Physician Assistant

## 2022-07-09 DIAGNOSIS — R748 Abnormal levels of other serum enzymes: Secondary | ICD-10-CM | POA: Diagnosis not present

## 2022-07-10 LAB — COMPLETE METABOLIC PANEL WITH GFR
AG Ratio: 1.8 (calc) (ref 1.0–2.5)
ALT: 22 U/L (ref 6–29)
AST: 16 U/L (ref 10–35)
Albumin: 4.3 g/dL (ref 3.6–5.1)
Alkaline phosphatase (APISO): 83 U/L (ref 37–153)
BUN: 24 mg/dL (ref 7–25)
CO2: 30 mmol/L (ref 20–32)
Calcium: 9.9 mg/dL (ref 8.6–10.4)
Chloride: 104 mmol/L (ref 98–110)
Creat: 1.04 mg/dL (ref 0.50–1.05)
Globulin: 2.4 g/dL (calc) (ref 1.9–3.7)
Glucose, Bld: 115 mg/dL — ABNORMAL HIGH (ref 65–99)
Potassium: 4.6 mmol/L (ref 3.5–5.3)
Sodium: 142 mmol/L (ref 135–146)
Total Bilirubin: 0.3 mg/dL (ref 0.2–1.2)
Total Protein: 6.7 g/dL (ref 6.1–8.1)
eGFR: 59 mL/min/{1.73_m2} — ABNORMAL LOW (ref >=60)

## 2022-07-10 NOTE — Progress Notes (Signed)
Kidneys are a little dry. Make sure you are staying hydrated especially as warmer weather comes in.

## 2022-07-11 NOTE — Telephone Encounter (Signed)
Rx written by historical provider. 

## 2022-07-29 ENCOUNTER — Encounter: Payer: Self-pay | Admitting: Family Medicine

## 2022-07-29 ENCOUNTER — Ambulatory Visit (INDEPENDENT_AMBULATORY_CARE_PROVIDER_SITE_OTHER): Payer: Medicare HMO | Admitting: Family Medicine

## 2022-07-29 VITALS — BP 129/71 | HR 62 | Temp 97.8°F | Ht <= 58 in | Wt 127.8 lb

## 2022-07-29 DIAGNOSIS — J329 Chronic sinusitis, unspecified: Secondary | ICD-10-CM | POA: Diagnosis not present

## 2022-07-29 DIAGNOSIS — J40 Bronchitis, not specified as acute or chronic: Secondary | ICD-10-CM

## 2022-07-29 DIAGNOSIS — R051 Acute cough: Secondary | ICD-10-CM

## 2022-07-29 MED ORDER — METHYLPREDNISOLONE 4 MG PO TBPK: ORAL_TABLET | ORAL | 0 refills | Status: DC

## 2022-07-29 MED ORDER — AZITHROMYCIN 250 MG PO TABS: ORAL_TABLET | ORAL | 0 refills | Status: AC

## 2022-07-29 MED ORDER — BENZONATATE 100 MG PO CAPS: 100.0000 mg | ORAL_CAPSULE | Freq: Three times a day (TID) | ORAL | 0 refills | Status: AC | PRN

## 2022-07-29 NOTE — Progress Notes (Signed)
Acute Office Visit  Subjective:     Patient ID: Leslie Roberson, female    DOB: Aug 14, 1955, 67 y.o.   MRN: [id number]  Chief Complaint  Patient presents with   Cough    Pt here for having a cough for three through four weeks now, worst in the morning for her     HPI Patient is in today for coughing for three weeks.  Has not found anything to help make this better.  She denies any fever, chills or bodyaches.  Review of Systems  Constitutional:  Negative for chills and fever.  Respiratory:  Positive for cough. Negative for shortness of breath.   Cardiovascular:  Negative for chest pain.  Neurological:  Negative for headaches.        Objective:    BP 129/71 (BP Location: Left Arm, Patient Position: Sitting, Cuff Size: Normal)   Pulse 62   Temp 97.8 F (36.6 C) (Oral)   Ht 4\' 10"  (1.473 m)   Wt 127 lb 12 oz (57.9 kg)   SpO2 98%   BMI 26.70 kg/m    Physical Exam Vitals and nursing note reviewed.  Constitutional:      General: She is not in acute distress.    Appearance: Normal appearance.  HENT:     Head: Normocephalic and atraumatic.     Right Ear: External ear normal.     Left Ear: External ear normal.     Nose: Nose normal.  Eyes:     Conjunctiva/sclera: Conjunctivae normal.  Cardiovascular:     Rate and Rhythm: Normal rate and regular rhythm.  Pulmonary:     Effort: Pulmonary effort is normal.     Breath sounds: Normal breath sounds.     Comments: Coughing during exam Neurological:     General: No focal deficit present.     Mental Status: She is alert and oriented to person, place, and time.  Psychiatric:        Mood and Affect: Mood normal.        Behavior: Behavior normal.        Thought Content: Thought content normal.        Judgment: Judgment normal.     No results found for any visits on 07/29/22.      Assessment & Plan:   Problem List Items Addressed This Visit       Respiratory   Sinobronchitis    Due to duration of symptoms  and inability to resolve, I will go ahead and give patient azithromycin as well as Medrol Dosepak.  I will also give patient Tessalon Perles.  She does have an allergy to medicated cough syrup. - Have patient follow-up in 1 week if no better we will go ahead and obtain a chest x-ray and look for further etiology.      Relevant Medications   methylPREDNISolone (MEDROL DOSEPAK) 4 MG TBPK tablet   azithromycin (ZITHROMAX) 250 MG tablet   benzonatate (TESSALON) 100 MG capsule     Other   Cough - Primary    Very pleasant female presents with 3 weeks of a cough.  She has been unable to get rid of it.  She does have a history of reflux for which she takes pantoprazole daily.       Relevant Medications   methylPREDNISolone (MEDROL DOSEPAK) 4 MG TBPK tablet    Meds ordered this encounter  Medications   methylPREDNISolone (MEDROL DOSEPAK) 4 MG TBPK tablet    Sig: Follow instructions on  pill pack    Dispense:  21 tablet    Refill:  0   azithromycin (ZITHROMAX) 250 MG tablet    Sig: Take 2 tablets on day 1, then 1 tablet daily on days 2 through 5    Dispense:  6 tablet    Refill:  0   benzonatate (TESSALON) 100 MG capsule    Sig: Take 1 capsule (100 mg total) by mouth 3 (three) times daily as needed for up to 13 days for cough.    Dispense:  40 capsule    Refill:  0    Return in about 1 week (around 08/05/2022).  Charlton Amor, DO

## 2022-07-29 NOTE — Assessment & Plan Note (Signed)
Due to duration of symptoms and inability to resolve, I will go ahead and give patient azithromycin as well as Medrol Dosepak.  I will also give patient Tessalon Perles.  She does have an allergy to medicated cough syrup. - Have patient follow-up in 1 week if no better we will go ahead and obtain a chest x-ray and look for further etiology.

## 2022-07-29 NOTE — Assessment & Plan Note (Signed)
Very pleasant female presents with 3 weeks of a cough.  She has been unable to get rid of it.  She does have a history of reflux for which she takes pantoprazole daily.

## 2022-07-31 NOTE — Progress Notes (Signed)
HPI: FU CAD.  I initially saw patient in March 2019 with chest pain.  We scheduled a cardiac catheterization but patient subsequently presented with pneumothorax. She ultimately underwent cardiac catheterization April 2019 and was found to have a 99% mid to distal right coronary artery, 75% proximal LAD followed by 60% lesion and normal LV function. The patient had PCI of both lesions with drug-eluting stents.  Echocardiogram May 2022 showed normal LV function, grade 1 diastolic dysfunction.  Nuclear study August 2023 normal.  Since last seen, she has occasional chest tightness for 1 minute improved with deep inspiration.  It is not exertional.  She does not have exertional chest pain, dyspnea on exertion, orthopnea, PND, pedal edema or syncope.  Current Outpatient Medications  Medication Sig Dispense Refill   albuterol (VENTOLIN HFA) 108 (90 Base) MCG/ACT inhaler Inhale 1-2 puffs into the lungs every 4 (four) hours as needed for wheezing or shortness of breath. 8.5 each 2   aspirin EC 81 MG tablet Take 81 mg by mouth at bedtime.     atorvastatin (LIPITOR) 20 MG tablet Take 1 tablet (20 mg total) by mouth daily. 90 tablet 3   busPIRone (BUSPAR) 15 MG tablet Take 60 mg by mouth daily.     calcium-vitamin D (OSCAL WITH D) 500-200 MG-UNIT tablet Take 1 tablet by mouth daily.     clonazePAM (KLONOPIN) 0.5 MG tablet Take 0.25 mg by mouth at bedtime.      denosumab (PROLIA) 60 MG/ML SOLN injection Inject 60 mg into the skin every 6 (six) months. Administer in upper arm, thigh, or abdomen     desvenlafaxine (PRISTIQ) 100 MG 24 hr tablet Take 100 mg by mouth in the morning.     doxycycline (VIBRA-TABS) 100 MG tablet Take 1 tablet (100 mg total) by mouth 2 (two) times daily for 7 days. 14 tablet 0   eszopiclone (LUNESTA) 2 MG TABS tablet      gabapentin (NEURONTIN) 300 MG capsule TAKE 1 CAPSULE IN THE MORNING AND 2 CAPSULES AT BEDTIME 270 capsule 3   ketoconazole (NIZORAL) 2 % shampoo Apply topically.      levothyroxine (SYNTHROID) 137 MCG tablet TAKE 1 TABLET (137 MCG TOTAL) once a week then 1/2 tablet all other days. 90 tablet 1   Menthol, Topical Analgesic, (MINERAL ICE EX) Apply 1 application to affected sites one to two times a day as needed (for pain)     metFORMIN (GLUCOPHAGE-XR) 500 MG 24 hr tablet TAKE 1 TABLET EVERY DAY WITH BREAKFAST 90 tablet 3   metoprolol succinate (TOPROL-XL) 25 MG 24 hr tablet TAKE 1 TABLET (25 MG TOTAL) BY MOUTH DAILY. 90 tablet 3   nitroGLYCERIN (NITROSTAT) 0.4 MG SL tablet DISSOLVE 1 TAB UNDER THE TONGUE FOR CHEST PAIN EVERY 5 MINUTES UP TO 3 DOSES IF PAIN PERSIST CALL 911/SEEK MEDICAL HELP 75 tablet 2   Omega-3 Fatty Acids (FISH OIL PO) Take 1 capsule by mouth daily.      oxybutynin (DITROPAN-XL) 5 MG 24 hr tablet TAKE 1 TABLET EVERY DAY 90 tablet 3   pantoprazole (PROTONIX) 40 MG tablet Take 1 tablet (40 mg total) by mouth 2 (two) times daily before a meal. 180 tablet 3   REXULTI 1 MG TABS Take 1 tablet by mouth daily.      tolterodine (DETROL LA) 2 MG 24 hr capsule Take 2 mg by mouth in the morning.     trazodone (DESYREL) 300 MG tablet Take 300 mg by mouth at bedtime.  amoxicillin (AMOXIL) 500 MG capsule Take 4 tablets one hour prior to procedure 4 capsule 0   benzonatate (TESSALON) 100 MG capsule Take 1 capsule (100 mg total) by mouth 3 (three) times daily as needed for up to 13 days for cough. 40 capsule 0   guaiFENesin-dextromethorphan (ROBITUSSIN DM) 100-10 MG/5ML syrup Take 5 mLs by mouth every 6 (six) hours as needed for cough. (Patient not taking: Reported on 08/11/2022) 118 mL 0   methylPREDNISolone (MEDROL DOSEPAK) 4 MG TBPK tablet Follow instructions on pill pack 21 tablet 0   No current facility-administered medications for this visit.     Past Medical History:  Diagnosis Date   Allergy 1992   Anxiety    Arthritis    "right little finger; base of thumb left hand; spine" (07/07/2017)   Asthma, chronic 11/27/2015   Cataract 2021   Chronic  back pain    Coronary artery disease    4/19 PCI/DEx2 to LAD, and PCI/DES x1 to RCA, normal EF   Depression    Diabetes mellitus without complication ([facility]) 2020   Diverticulitis of colon 12/21/2015   Colonoscopy every 5 years/digestive health.    GERD (gastroesophageal reflux disease) 11/27/2015   History of blood transfusion    "related to spinal fusions"   Hyperlipidemia LDL goal <70 11/29/2018   Hypothyroidism    Idiopathic scoliosis 01/22/2016   Osteoporosis 11/27/2015   On prolia.    Pneumonia 2016   Pneumothorax 06/26/2017   Pre-diabetes    Sleep apnea 2020   Thyroid disease     Past Surgical History:  Procedure Laterality Date   ABDOMINAL HYSTERECTOMY     bladder mesh     S/P bladder sling"   COLON SURGERY  2007   CORONARY PRESSURE/FFR STUDY N/A 07/07/2017   Procedure: INTRAVASCULAR PRESSURE WIRE/FFR STUDY;  Surgeon: Marykay Lex, MD;  Location: [facility] INVASIVE CV LAB;  Service: Cardiovascular;  Laterality: N/A;   CORONARY STENT INTERVENTION N/A 07/07/2017   Procedure: CORONARY STENT INTERVENTION;  Surgeon: Marykay Lex, MD;  Location: [facility] INVASIVE CV LAB;  Service: Cardiovascular;  Laterality: N/A;   ELBOW SURGERY Right    "dislocation"   INCONTINENCE SURGERY     "didn't work"   JOINT REPLACEMENT     LEFT HEART CATH AND CORONARY ANGIOGRAPHY N/A 07/07/2017   Procedure: LEFT HEART CATH AND CORONARY ANGIOGRAPHY;  Surgeon: Marykay Lex, MD;  Location: [facility] INVASIVE CV LAB;  Service: Cardiovascular;  Laterality: N/A;   SPINAL FUSION  ~ 1970 X 3   "below neck - lower back"   TONSILLECTOMY     TOTAL HIP ARTHROPLASTY Left     Social History   Socioeconomic History   Marital status: Married    Spouse name: [name]   Number of children: 1   Years of education: 16   Highest education level: Bachelor's degree (e.g., BA, AB, BS)  Occupational History   Occupation: Retired  Tobacco Use   Smoking status: Never   Smokeless tobacco: Never  Vaping Use   Vaping Use:  Never used  Substance and Sexual Activity   Alcohol use: Not Currently    Comment: Maybe 3 drinks/year   Drug use: No   Sexual activity: Yes    Birth control/protection: None  Other Topics Concern   Not on file  Social History Narrative   Lives with her husband. She has one child. She enjoys playing games on the computer.   Social Determinants of Health   Financial Resource Strain: Low  Risk  (08/03/2022)   Overall Financial Resource Strain (CARDIA)    Difficulty of Paying Living Expenses: Not hard at all  Food Insecurity: No Food Insecurity (08/03/2022)   Hunger Vital Sign    Worried About Running Out of Food in the Last Year: Never true    Ran Out of Food in the Last Year: Never true  Transportation Needs: No Transportation Needs (08/03/2022)   PRAPARE - Administrator, Civil Service (Medical): No    Lack of Transportation (Non-Medical): No  Physical Activity: Inactive (08/03/2022)   Exercise Vital Sign    Days of Exercise per Week: 0 days    Minutes of Exercise per Session: 0 min  Stress: No Stress Concern Present (08/03/2022)   [employer] of Occupational Health - Occupational Stress Questionnaire    Feeling of Stress : Not at all  Social Connections: Socially Integrated (08/03/2022)   Social Connection and Isolation Panel [NHANES]    Frequency of Communication with Friends and Family: Twice a week    Frequency of Social Gatherings with Friends and Family: Once a week    Attends Religious Services: More than 4 times per year    Active Member of [employer] or Organizations: No    Attends Engineer, structural: 1 to 4 times per year    Marital Status: Married  Catering manager Violence: Not At Risk (09/30/2021)   Humiliation, Afraid, Rape, and Kick questionnaire    Fear of Current or Ex-Partner: No    Emotionally Abused: No    Physically Abused: No    Sexually Abused: No    Family History  Problem Relation Age of Onset   Cancer Mother        lung    Depression Mother    Early death Mother    Heart attack Father    Hyperlipidemia Father    Hypertension Father    Arthritis Father    Heart disease Father    Diabetes Maternal Aunt    Hyperlipidemia Paternal Aunt    COPD Paternal Aunt    Hypertension Paternal Aunt    Stroke Paternal Aunt     ROS: no fevers or chills, productive cough, hemoptysis, dysphasia, odynophagia, melena, hematochezia, dysuria, hematuria, rash, seizure activity, orthopnea, PND, pedal edema, claudication. Remaining systems are negative.  Physical Exam: Well-developed well-nourished in no acute distress.  Skin is warm and dry.  HEENT is normal.  Neck is supple.  Chest is clear to auscultation with normal expansion.  Cardiovascular exam is regular rate and rhythm.  Abdominal exam nontender or distended. No masses palpated. Extremities show no edema. neuro grossly intact   A/P  1 coronary artery disease-patient has occasional atypical chest tightness not related to activities.  It is unchanged and lasts 1 minute at a time.  It is unlike her symptoms prior to her PCI.  Most recent nuclear study showed no ischemia.  Plan to continue aspirin and statin.  2 hyperlipidemia-continue statin.  3 dyspnea-felt secondary to severe scoliosis.  Previous echocardiogram showed normal LV function.  Olga Millers, MD

## 2022-08-03 ENCOUNTER — Encounter: Payer: Self-pay | Admitting: Physician Assistant

## 2022-08-04 MED ORDER — AMOXICILLIN 500 MG PO TABS: ORAL_TABLET | ORAL | 0 refills | Status: DC

## 2022-08-05 ENCOUNTER — Ambulatory Visit (INDEPENDENT_AMBULATORY_CARE_PROVIDER_SITE_OTHER): Payer: Medicare HMO | Admitting: Family Medicine

## 2022-08-05 ENCOUNTER — Encounter: Payer: Self-pay | Admitting: Family Medicine

## 2022-08-05 VITALS — BP 95/59 | HR 74 | Temp 98.3°F | Ht <= 58 in | Wt 124.0 lb

## 2022-08-05 DIAGNOSIS — J329 Chronic sinusitis, unspecified: Secondary | ICD-10-CM | POA: Diagnosis not present

## 2022-08-05 DIAGNOSIS — Z792 Long term (current) use of antibiotics: Secondary | ICD-10-CM

## 2022-08-05 DIAGNOSIS — J40 Bronchitis, not specified as acute or chronic: Secondary | ICD-10-CM | POA: Diagnosis not present

## 2022-08-05 MED ORDER — DOXYCYCLINE HYCLATE 100 MG PO TABS
100.0000 mg | ORAL_TABLET | Freq: Two times a day (BID) | ORAL | 0 refills | Status: DC
Start: 2022-08-05 — End: 2022-08-12

## 2022-08-05 MED ORDER — AMOXICILLIN 500 MG PO CAPS
ORAL_CAPSULE | ORAL | 0 refills | Status: DC
Start: 2022-08-05 — End: 2022-09-08

## 2022-08-05 MED ORDER — GUAIFENESIN-DM 100-10 MG/5ML PO SYRP
5.0000 mL | ORAL_SOLUTION | Freq: Four times a day (QID) | ORAL | 0 refills | Status: DC | PRN
Start: 2022-08-05 — End: 2022-09-08

## 2022-08-05 NOTE — Progress Notes (Signed)
Established patient visit   Patient: Leslie Roberson   DOB: [birth year]   67 y.o. Female  MRN: [id number] Visit Date: 08/05/2022  Today's healthcare provider: Charlton Amor, DO   Chief Complaint  Patient presents with   sinobronchitis follow up     Patient in office - states she is still coughing producing yellow phlegm. She states cough may be slightly better.     SUBJECTIVE    Chief Complaint  Patient presents with   sinobronchitis follow up     Patient in office - states she is still coughing producing yellow phlegm. She states cough may be slightly better.    HPI  Pt presents for follow up on sinobronchitis. She was given a medrol dose pack as well as azithromycin. Today, she feels a little better but has been having some residual coughing and sputum production. Cough is worse in the morning time.   Review of Systems  Constitutional:  Negative for activity change, fatigue and fever.  Respiratory:  Negative for cough and shortness of breath.   Cardiovascular:  Negative for chest pain.  Gastrointestinal:  Negative for abdominal pain.  Genitourinary:  Negative for difficulty urinating.       Current Meds  Medication Sig   albuterol (VENTOLIN HFA) 108 (90 Base) MCG/ACT inhaler Inhale 1-2 puffs into the lungs every 4 (four) hours as needed for wheezing or shortness of breath.   amoxicillin (AMOXIL) 500 MG capsule Take 4 tablets one hour prior to procedure   aspirin EC 81 MG tablet Take 81 mg by mouth at bedtime.   atorvastatin (LIPITOR) 20 MG tablet Take 1 tablet (20 mg total) by mouth daily.   benzonatate (TESSALON) 100 MG capsule Take 1 capsule (100 mg total) by mouth 3 (three) times daily as needed for up to 13 days for cough.   busPIRone (BUSPAR) 15 MG tablet Take 60 mg by mouth daily.   calcium-vitamin D (OSCAL WITH D) 500-200 MG-UNIT tablet Take 1 tablet by mouth daily.   clonazePAM (KLONOPIN) 0.5 MG tablet Take 0.25 mg by mouth at bedtime.    denosumab  (PROLIA) 60 MG/ML SOLN injection Inject 60 mg into the skin every 6 (six) months. Administer in upper arm, thigh, or abdomen   desvenlafaxine (PRISTIQ) 100 MG 24 hr tablet Take 100 mg by mouth in the morning.   doxycycline (VIBRA-TABS) 100 MG tablet Take 1 tablet (100 mg total) by mouth 2 (two) times daily for 7 days.   eszopiclone (LUNESTA) 2 MG TABS tablet    gabapentin (NEURONTIN) 300 MG capsule TAKE 1 CAPSULE IN THE MORNING AND 2 CAPSULES AT BEDTIME   guaiFENesin-dextromethorphan (ROBITUSSIN DM) 100-10 MG/5ML syrup Take 5 mLs by mouth every 6 (six) hours as needed for cough.   ketoconazole (NIZORAL) 2 % shampoo Apply topically.   levothyroxine (SYNTHROID) 137 MCG tablet TAKE 1 TABLET (137 MCG TOTAL) once a week then 1/2 tablet all other days.   Menthol, Topical Analgesic, (MINERAL ICE EX) Apply 1 application to affected sites one to two times a day as needed (for pain)   metFORMIN (GLUCOPHAGE-XR) 500 MG 24 hr tablet TAKE 1 TABLET EVERY DAY WITH BREAKFAST   methylPREDNISolone (MEDROL DOSEPAK) 4 MG TBPK tablet Follow instructions on pill pack   metoprolol succinate (TOPROL-XL) 25 MG 24 hr tablet TAKE 1 TABLET (25 MG TOTAL) BY MOUTH DAILY.   nitroGLYCERIN (NITROSTAT) 0.4 MG SL tablet DISSOLVE 1 TAB UNDER THE TONGUE FOR CHEST PAIN EVERY 5 MINUTES UP  TO 3 DOSES IF PAIN PERSIST CALL 911/SEEK MEDICAL HELP   Omega-3 Fatty Acids (FISH OIL PO) Take 1 capsule by mouth daily.    oxybutynin (DITROPAN-XL) 5 MG 24 hr tablet TAKE 1 TABLET EVERY DAY   pantoprazole (PROTONIX) 40 MG tablet Take 1 tablet (40 mg total) by mouth 2 (two) times daily before a meal.   REXULTI 1 MG TABS Take 1 tablet by mouth daily.    tolterodine (DETROL LA) 2 MG 24 hr capsule Take 2 mg by mouth in the morning.   trazodone (DESYREL) 300 MG tablet Take 300 mg by mouth at bedtime.   [DISCONTINUED] amoxicillin (AMOXIL) 500 MG tablet 4 tabs (2000mg ) 1 hours prior to procedure    OBJECTIVE    BP (!) 95/59   Pulse 74   Temp 98.3 F  (36.8 C)   Ht 4\' 10"  (1.473 m)   Wt 124 lb (56.2 kg)   SpO2 99%   BMI 25.92 kg/m   Physical Exam Vitals and nursing note reviewed.  Constitutional:      General: She is not in acute distress.    Appearance: Normal appearance.  HENT:     Head: Normocephalic and atraumatic.     Right Ear: External ear normal.     Left Ear: External ear normal.     Nose: Nose normal.  Eyes:     Conjunctiva/sclera: Conjunctivae normal.  Cardiovascular:     Rate and Rhythm: Normal rate and regular rhythm.  Pulmonary:     Effort: Pulmonary effort is normal.     Breath sounds: Normal breath sounds.  Neurological:     General: No focal deficit present.     Mental Status: She is alert and oriented to person, place, and time.  Psychiatric:        Mood and Affect: Mood normal.        Behavior: Behavior normal.        Thought Content: Thought content normal.        Judgment: Judgment normal.        ASSESSMENT & PLAN    Problem List Items Addressed This Visit       Respiratory   Sinobronchitis - Primary    - pt has somewhat improvement of symptoms will go ahead and give doxycycline as well as. Sent in cough syrup       Relevant Medications   doxycycline (VIBRA-TABS) 100 MG tablet   guaiFENesin-dextromethorphan (ROBITUSSIN DM) 100-10 MG/5ML syrup   amoxicillin (AMOXIL) 500 MG capsule     Other   Need for antibiotic prophylaxis for dental procedure    - have re-sent in amoxicillin since it was cancelled when I ordered her doxy      Relevant Medications   amoxicillin (AMOXIL) 500 MG capsule    No follow-ups on file.      Meds ordered this encounter  Medications   doxycycline (VIBRA-TABS) 100 MG tablet    Sig: Take 1 tablet (100 mg total) by mouth 2 (two) times daily for 7 days.    Dispense:  14 tablet    Refill:  0   guaiFENesin-dextromethorphan (ROBITUSSIN DM) 100-10 MG/5ML syrup    Sig: Take 5 mLs by mouth every 6 (six) hours as needed for cough.    Dispense:  118 mL     Refill:  0   amoxicillin (AMOXIL) 500 MG capsule    Sig: Take 4 tablets one hour prior to procedure    Dispense:  4 capsule  Refill:  0    No orders of the defined types were placed in this encounter.    Charlton Amor, DO  [facility] at [facility] [phone] (phone) [phone] (fax)  [facility]

## 2022-08-05 NOTE — Assessment & Plan Note (Signed)
-   pt has somewhat improvement of symptoms will go ahead and give doxycycline as well as. Sent in cough syrup

## 2022-08-05 NOTE — Assessment & Plan Note (Signed)
-   have re-sent in amoxicillin since it was cancelled when I ordered her doxy

## 2022-08-11 ENCOUNTER — Encounter: Payer: Self-pay | Admitting: Cardiology

## 2022-08-11 ENCOUNTER — Ambulatory Visit: Payer: Medicare HMO | Attending: Cardiology | Admitting: Cardiology

## 2022-08-11 VITALS — BP 98/52 | HR 75 | Ht <= 58 in | Wt 129.2 lb

## 2022-08-11 DIAGNOSIS — I25118 Atherosclerotic heart disease of native coronary artery with other forms of angina pectoris: Secondary | ICD-10-CM

## 2022-08-11 DIAGNOSIS — E785 Hyperlipidemia, unspecified: Secondary | ICD-10-CM

## 2022-08-11 DIAGNOSIS — R0609 Other forms of dyspnea: Secondary | ICD-10-CM

## 2022-08-11 NOTE — Patient Instructions (Signed)
Medication Instructions:  No changes *If you need a refill on your cardiac medications before your next appointment, please call your pharmacy*   Follow-Up: At [facility], you and your health needs are our priority.  As part of our continuing mission to provide you with exceptional heart care, we have created designated Provider Care Teams.  These Care Teams include your primary Cardiologist (physician) and Advanced Practice Providers (APPs -  Physician Assistants and Nurse Practitioners) who all work together to provide you with the care you need, when you need it.  We recommend signing up for the patient portal called "MyChart".  Sign up information is provided on this After Visit Summary.  MyChart is used to connect with patients for Virtual Visits (Telemedicine).  Patients are able to view lab/test results, encounter notes, upcoming appointments, etc.  Non-urgent messages can be sent to your provider as well.   To learn more about what you can do with MyChart, go to [email or web address].    Your next appointment:   1 year(s)  Provider:   Brian Crenshaw, MD     

## 2022-08-12 ENCOUNTER — Encounter: Payer: Self-pay | Admitting: Physician Assistant

## 2022-08-12 ENCOUNTER — Telehealth (INDEPENDENT_AMBULATORY_CARE_PROVIDER_SITE_OTHER): Payer: Medicare HMO | Admitting: Physician Assistant

## 2022-08-12 ENCOUNTER — Ambulatory Visit (INDEPENDENT_AMBULATORY_CARE_PROVIDER_SITE_OTHER): Payer: Medicare HMO

## 2022-08-12 VITALS — Ht <= 58 in | Wt 129.0 lb

## 2022-08-12 DIAGNOSIS — R059 Cough, unspecified: Secondary | ICD-10-CM | POA: Diagnosis not present

## 2022-08-12 DIAGNOSIS — R052 Subacute cough: Secondary | ICD-10-CM

## 2022-08-12 MED ORDER — DEXAMETHASONE 4 MG PO TABS: 4.0000 mg | ORAL_TABLET | Freq: Two times a day (BID) | ORAL | 0 refills | Status: DC

## 2022-08-12 NOTE — Progress Notes (Signed)
Called patient and discussed normal CXR and discussed plan today.

## 2022-08-12 NOTE — Progress Notes (Signed)
..Virtual Visit via Video Note  I connected with Leslie Roberson on 08/12/22 at 11:30 AM EDT by a video enabled telemedicine application and verified that I am speaking with the correct person using two identifiers.  Location: Patient: home Provider: clinic  .Marland KitchenParticipating in visit:  Patient: Leslie Roberson Provider: Tandy Gaw PA-C   I discussed the limitations of evaluation and management by telemedicine and the availability of in person appointments. The patient expressed understanding and agreed to proceed.  History of Present Illness: Pt is a 67 yo female who calls into the clinic with persistent cough for about 4 weeks. She has completed zpak, doxycycline, medrol dose pack, tessalon pearls and Robitussin and she continues to cough up yellow sputum. She is not using her albuterol. She denies any fever, chills, SOB, headache, body aches. She has some sinus pressure on the left side.    Active Ambulatory Problems    Diagnosis Date Noted   GERD (gastroesophageal reflux disease) 11/27/2015   Osteoporosis 11/27/2015   GAD (generalized anxiety disorder) 11/27/2015   MDD (major depressive disorder), recurrent, in full remission (HCC) 11/27/2015   Chronic dryness of both eyes 11/27/2015   Acquired hypothyroidism 11/27/2015   Asthma, chronic 11/27/2015   Insomnia 11/27/2015   Seborrheic keratoses 11/27/2015   Diverticulitis of colon 12/21/2015   Hip bursitis 12/25/2015   Biceps tendonitis on right 12/25/2015   Idiopathic scoliosis 01/22/2016   Vaginal atrophy 01/31/2016   Rosacea 02/04/2016   Right knee pain 02/19/2016   Paresthesia 04/03/2016   Facet hypertrophy of lumbosacral region 04/03/2016   Chronic back pain 04/03/2016   Bad taste in mouth 07/26/2016   No energy 09/15/2016   Lump in throat 01/27/2017   Dysphagia 01/27/2017   Chronic tension-type headache, intractable 02/05/2017   Prediabetes 02/05/2017   Right leg pain 02/06/2017   Atypical chest pain 05/24/2017    Itchy scalp 05/24/2017   Pneumothorax 06/25/2017   Angina, class III (HCC) 07/07/2017   CAD S/P percutaneous coronary angioplasty    Tongue mass 08/14/2017   Excessive sweating 09/22/2017   Rhinorrhea 01/04/2018   Arthralgia 02/05/2018   Nasal dryness 02/06/2018   Hyperlipidemia LDL goal <70 11/29/2018   DJD (degenerative joint disease), lumbosacral 11/29/2018   Pre-operative clearance 11/29/2018   Diabetes mellitus without complication (HCC) 02/07/2019   Migraine without aura and without status migrainosus, not intractable 02/08/2019   OAB (overactive bladder) 02/08/2019   Snoring 05/02/2019   Non-restorative sleep 05/02/2019   OSA on CPAP 05/20/2019   Ingrown right greater toenail 07/19/2019   Status post hip replacement, left 07/19/2019   Left hip pain 07/19/2019   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   Acute conjunctivitis of both eyes 01/23/2020   CAD in native artery 11/27/2015   S/P cardiac cath 07/07/2017   Paroxysmal nocturnal dyspnea 05/14/2020   Orthopnea 05/14/2020   Cough 05/18/2020   Controlled type 2 diabetes mellitus with complication, without long-term current use of insulin (HCC) 11/16/2020   Right hand pain 11/16/2020   Fall 11/16/2020   Bilateral hearing loss 04/05/2021   Impacted cerumen, left ear 04/05/2021   Ear fullness, left 04/08/2021   Mass of right axilla 05/15/2021   Closed dislocation of metacarpophalangeal joint of right thumb 11/18/2021   Community acquired pneumonia of left lower lobe of lung 12/11/2021   Sinobronchitis 07/29/2022   Need for antibiotic prophylaxis for dental procedure 08/05/2022   Resolved Ambulatory Problems    Diagnosis Date Noted   Acute medial meniscus tear  04/23/2016   Left knee pain 04/28/2016   Mild intermittent asthma without complication 08/18/2016   Past Medical History:  Diagnosis Date   Allergy 1992   Anxiety    Arthritis    Cataract 2021   Coronary artery disease    Depression    History  of blood transfusion    Hypothyroidism    Pneumonia 2016   Pre-diabetes    Sleep apnea 2020   Thyroid disease      Observations/Objective: No acute distress Normal breathing Productive cough on call  .Marland Kitchen Today's Vitals   08/12/22 1131  Weight: 129 lb (58.5 kg)  Height: 4\' 10"  (1.473 m)   Body mass index is 26.96 kg/m.    Assessment and Plan: Marland KitchenMarland KitchenKymesha was seen today for cough.  Diagnoses and all orders for this visit:  Subacute cough -     DG Chest 2 View; Future   CXR no bacterial infection today Decadron sent for 5 days Use albuterol as needed  Rest and hydrate Continue to use cough syrup as needed Follow up as needed or if not improving   Follow Up Instructions:    I discussed the assessment and treatment plan with the patient. The patient was provided an opportunity to ask questions and all were answered. The patient agreed with the plan and demonstrated an understanding of the instructions.   The patient was advised to call back or seek an in-person evaluation if the symptoms worsen or if the condition fails to improve as anticipated.    Tandy Gaw, PA-C

## 2022-08-12 NOTE — Progress Notes (Signed)
Pt states she has yellow phlem coming up, pt reports no fever or changes to her medications

## 2022-08-14 ENCOUNTER — Encounter: Payer: Self-pay | Admitting: Physician Assistant

## 2022-09-08 ENCOUNTER — Ambulatory Visit (INDEPENDENT_AMBULATORY_CARE_PROVIDER_SITE_OTHER): Payer: Medicare HMO | Admitting: Physician Assistant

## 2022-09-08 ENCOUNTER — Encounter: Payer: Self-pay | Admitting: Physician Assistant

## 2022-09-08 VITALS — BP 115/55 | HR 77 | Ht <= 58 in | Wt 127.2 lb

## 2022-09-08 DIAGNOSIS — R7303 Prediabetes: Secondary | ICD-10-CM | POA: Diagnosis not present

## 2022-09-08 DIAGNOSIS — R053 Chronic cough: Secondary | ICD-10-CM

## 2022-09-08 DIAGNOSIS — E039 Hypothyroidism, unspecified: Secondary | ICD-10-CM

## 2022-09-08 DIAGNOSIS — E785 Hyperlipidemia, unspecified: Secondary | ICD-10-CM | POA: Diagnosis not present

## 2022-09-08 DIAGNOSIS — Z0000 Encounter for general adult medical examination without abnormal findings: Secondary | ICD-10-CM | POA: Diagnosis not present

## 2022-09-08 MED ORDER — MONTELUKAST SODIUM 10 MG PO TABS
10.0000 mg | ORAL_TABLET | Freq: Every day | ORAL | 3 refills | Status: DC
Start: 2022-09-08 — End: 2022-10-06

## 2022-09-08 NOTE — Patient Instructions (Signed)

## 2022-09-09 ENCOUNTER — Encounter: Payer: Self-pay | Admitting: Physician Assistant

## 2022-09-09 NOTE — Progress Notes (Signed)
Complete physical exam  Patient: Leslie Roberson   DOB: Mar 19, 1956   67 y.o. Female  MRN: [id number]  Subjective:    Chief Complaint  Patient presents with   Annual Exam    Leslie Roberson is a 67 y.o. female who presents today for a complete physical exam. She reports consuming a general diet.  Pt is   She generally feels well. She reports sleeping well. She does have additional problems to discuss today.   Chronic cough since early spring when she had URI. Finished 2 antibiotics and 2 steroid packs. Last CXR in may normal. She does feel better but has a productive cough with sputum in the morning and gets better throughout the day. No SOB or lower ext edema.    Most recent fall risk assessment:    09/08/2022    3:05 PM  Fall Risk   Falls in the past year? 0  Number falls in past yr: 0  Injury with Fall? 0     Most recent depression screenings:    09/08/2022    4:45 PM 04/23/2022    3:03 PM  PHQ 2/9 Scores  PHQ - 2 Score 2 0  PHQ- 9 Score 5     Vision:Within last year and Dental: No current dental problems and Receives regular dental care  Patient Active Problem List   Diagnosis Date Noted   Routine health maintenance 09/08/2022   Need for antibiotic prophylaxis for dental procedure 08/05/2022   Sinobronchitis 07/29/2022   Community acquired pneumonia of left lower lobe of lung 12/11/2021   Closed dislocation of metacarpophalangeal joint of right thumb 11/18/2021   Mass of right axilla 05/15/2021   Ear fullness, left 04/08/2021   Bilateral hearing loss 04/05/2021   Impacted cerumen, left ear 04/05/2021   Controlled type 2 diabetes mellitus with complication, without long-term current use of insulin (HCC) 11/16/2020   Right hand pain 11/16/2020   Fall 11/16/2020   Cough 05/18/2020   Paroxysmal nocturnal dyspnea 05/14/2020   Orthopnea 05/14/2020   Acute conjunctivitis of both eyes 01/23/2020   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   Ingrown  right greater toenail 07/19/2019   Status post hip replacement, left 07/19/2019   Left hip pain 07/19/2019   OSA on CPAP 05/20/2019   Snoring 05/02/2019   Non-restorative sleep 05/02/2019   Migraine without aura and without status migrainosus, not intractable 02/08/2019   OAB (overactive bladder) 02/08/2019   Diabetes mellitus without complication (HCC) 02/07/2019   Hyperlipidemia LDL goal <70 11/29/2018   DJD (degenerative joint disease), lumbosacral 11/29/2018   Pre-operative clearance 11/29/2018   Nasal dryness 02/06/2018   Arthralgia 02/05/2018   Rhinorrhea 01/04/2018   Excessive sweating 09/22/2017   Tongue mass 08/14/2017   Angina, class III (HCC) 07/07/2017   S/P cardiac cath 07/07/2017   CAD S/P percutaneous coronary angioplasty    Pneumothorax 06/25/2017   Atypical chest pain 05/24/2017   Itchy scalp 05/24/2017   Right leg pain 02/06/2017   Chronic tension-type headache, intractable 02/05/2017   Prediabetes 02/05/2017   Lump in throat 01/27/2017   Dysphagia 01/27/2017   No energy 09/15/2016   Bad taste in mouth 07/26/2016   Paresthesia 04/03/2016   Facet hypertrophy of lumbosacral region 04/03/2016   Chronic back pain 04/03/2016   Right knee pain 02/19/2016   Rosacea 02/04/2016   Vaginal atrophy 01/31/2016   Idiopathic scoliosis 01/22/2016   Hip bursitis 12/25/2015   Biceps tendonitis on right 12/25/2015   Diverticulitis of  colon 12/21/2015   GERD (gastroesophageal reflux disease) 11/27/2015   Osteoporosis 11/27/2015   GAD (generalized anxiety disorder) 11/27/2015   MDD (major depressive disorder), recurrent, in full remission (HCC) 11/27/2015   Chronic dryness of both eyes 11/27/2015   Acquired hypothyroidism 11/27/2015   Asthma, chronic 11/27/2015   Insomnia 11/27/2015   Seborrheic keratoses 11/27/2015   CAD in native artery 11/27/2015   Past Medical History:  Diagnosis Date   Allergy 1992   Anxiety    Arthritis    "right little finger; base of thumb  left hand; spine" (07/07/2017)   Asthma, chronic 11/27/2015   Cataract 2021   Chronic back pain    Coronary artery disease    4/19 PCI/DEx2 to LAD, and PCI/DES x1 to RCA, normal EF   Depression    Diabetes mellitus without complication (HCC) 2020   Diverticulitis of colon 12/21/2015   Colonoscopy every 5 years/digestive health.    GERD (gastroesophageal reflux disease) 11/27/2015   History of blood transfusion    "related to spinal fusions"   Hyperlipidemia LDL goal <70 11/29/2018   Hypothyroidism    Idiopathic scoliosis 01/22/2016   Osteoporosis 11/27/2015   On prolia.    Pneumonia 2016   Pneumothorax 06/26/2017   Pre-diabetes    Sleep apnea 2020   Thyroid disease    Past Surgical History:  Procedure Laterality Date   ABDOMINAL HYSTERECTOMY     bladder mesh     S/P bladder sling"   COLON SURGERY  2007   CORONARY PRESSURE/FFR STUDY N/A 07/07/2017   Procedure: INTRAVASCULAR PRESSURE WIRE/FFR STUDY;  Surgeon: Marykay Lex, MD;  Location: [facility] INVASIVE CV LAB;  Service: Cardiovascular;  Laterality: N/A;   CORONARY STENT INTERVENTION N/A 07/07/2017   Procedure: CORONARY STENT INTERVENTION;  Surgeon: Marykay Lex, MD;  Location: [facility] INVASIVE CV LAB;  Service: Cardiovascular;  Laterality: N/A;   ELBOW SURGERY Right    "dislocation"   INCONTINENCE SURGERY     "didn't work"   JOINT REPLACEMENT     LEFT HEART CATH AND CORONARY ANGIOGRAPHY N/A 07/07/2017   Procedure: LEFT HEART CATH AND CORONARY ANGIOGRAPHY;  Surgeon: Marykay Lex, MD;  Location: [facility] INVASIVE CV LAB;  Service: Cardiovascular;  Laterality: N/A;   SPINAL FUSION  ~ 1970 X 3   "below neck - lower back"   TONSILLECTOMY     TOTAL HIP ARTHROPLASTY Left    Family History  Problem Relation Age of Onset   Cancer Mother        lung   Depression Mother    Early death Mother    Heart attack Father    Hyperlipidemia Father    Hypertension Father    Arthritis Father    Heart disease Father    Diabetes Maternal  Aunt    Hyperlipidemia Paternal Aunt    COPD Paternal Aunt    Hypertension Paternal Aunt    Stroke Paternal Aunt    Allergies  Allergen Reactions   Hydromorphone Rash   Levofloxacin Other (See Comments)    Hallucinations   Meperidine Nausea And Vomiting and Other (See Comments)    Also "doesn't work well for my pain"   Broccoli [Brassica Oleracea] Nausea Only and Other (See Comments)    Causes stomach pain also   Adhesive [Tape] Rash      Patient Care Team: Nolene Ebbs as PCP - General (Family Medicine) Jens Som Madolyn Frieze, MD as PCP - Cardiology (Cardiology)   Outpatient Medications Prior to Visit  Medication Sig   albuterol (VENTOLIN HFA) 108 (90 Base) MCG/ACT inhaler Inhale 1-2 puffs into the lungs every 4 (four) hours as needed for wheezing or shortness of breath.   aspirin EC 81 MG tablet Take 81 mg by mouth at bedtime.   atorvastatin (LIPITOR) 20 MG tablet Take 1 tablet (20 mg total) by mouth daily.   busPIRone (BUSPAR) 15 MG tablet Take 60 mg by mouth daily.   calcium-vitamin D (OSCAL WITH D) 500-200 MG-UNIT tablet Take 1 tablet by mouth daily.   clonazePAM (KLONOPIN) 0.5 MG tablet Take 0.25 mg by mouth at bedtime.    denosumab (PROLIA) 60 MG/ML SOLN injection Inject 60 mg into the skin every 6 (six) months. Administer in upper arm, thigh, or abdomen   desvenlafaxine (PRISTIQ) 100 MG 24 hr tablet Take 100 mg by mouth in the morning.   eszopiclone (LUNESTA) 2 MG TABS tablet Take 2 mg by mouth at bedtime.   gabapentin (NEURONTIN) 300 MG capsule TAKE 1 CAPSULE IN THE MORNING AND 2 CAPSULES AT BEDTIME   ketoconazole (NIZORAL) 2 % shampoo Apply topically.   levothyroxine (SYNTHROID) 137 MCG tablet TAKE 1 TABLET (137 MCG TOTAL) once a week then 1/2 tablet all other days.   Menthol, Topical Analgesic, (MINERAL ICE EX) Apply 1 application to affected sites one to two times a day as needed (for pain)   metFORMIN (GLUCOPHAGE-XR) 500 MG 24 hr tablet TAKE 1 TABLET EVERY DAY  WITH BREAKFAST   metoprolol succinate (TOPROL-XL) 25 MG 24 hr tablet TAKE 1 TABLET (25 MG TOTAL) BY MOUTH DAILY.   nitroGLYCERIN (NITROSTAT) 0.4 MG SL tablet DISSOLVE 1 TAB UNDER THE TONGUE FOR CHEST PAIN EVERY 5 MINUTES UP TO 3 DOSES IF PAIN PERSIST CALL 911/SEEK MEDICAL HELP   Omega-3 Fatty Acids (FISH OIL PO) Take 1 capsule by mouth daily.    oxybutynin (DITROPAN-XL) 5 MG 24 hr tablet TAKE 1 TABLET EVERY DAY   pantoprazole (PROTONIX) 40 MG tablet Take 1 tablet (40 mg total) by mouth 2 (two) times daily before a meal.   REXULTI 1 MG TABS Take 1 tablet by mouth daily.    tolterodine (DETROL LA) 2 MG 24 hr capsule Take 2 mg by mouth in the morning.   trazodone (DESYREL) 300 MG tablet Take 300 mg by mouth at bedtime.   [DISCONTINUED] amoxicillin (AMOXIL) 500 MG capsule Take 4 tablets one hour prior to procedure (Patient not taking: Reported on 08/12/2022)   [DISCONTINUED] dexamethasone (DECADRON) 4 MG tablet Take 1 tablet (4 mg total) by mouth 2 (two) times daily with a meal. (Patient not taking: Reported on 09/08/2022)   [DISCONTINUED] guaiFENesin-dextromethorphan (ROBITUSSIN DM) 100-10 MG/5ML syrup Take 5 mLs by mouth every 6 (six) hours as needed for cough.   No facility-administered medications prior to visit.    ROS  See HPI.       Objective:     BP (!) 115/55   Pulse 77   Ht 4\' 10"  (1.473 m)   Wt 127 lb 4 oz (57.7 kg)   SpO2 95%   BMI 26.60 kg/m  BP Readings from Last 3 Encounters:  09/08/22 (!) 115/55  08/11/22 (!) 98/52  08/05/22 (!) 95/59   Wt Readings from Last 3 Encounters:  09/08/22 127 lb 4 oz (57.7 kg)  08/12/22 129 lb (58.5 kg)  08/11/22 129 lb 3.2 oz (58.6 kg)      Physical Exam  BP (!) 115/55   Pulse 77   Ht 4\' 10"  (1.473 m)  Wt 127 lb 4 oz (57.7 kg)   SpO2 95%   BMI 26.60 kg/m   General Appearance:    Alert, cooperative, no distress, appears stated age  Head:    Normocephalic, without obvious abnormality, atraumatic  Eyes:    PERRL,  conjunctiva/corneas clear, EOM's intact, fundi    benign, both eyes  Ears:    Normal TM's and external ear canals, both ears  Nose:   Nares normal, septum midline, mucosa normal, no drainage    or sinus tenderness  Throat:   Lips, mucosa, and tongue normal; teeth and gums normal  Neck:   Supple, symmetrical, trachea midline, no adenopathy;    thyroid:  no enlargement/tenderness/nodules; no carotid   bruit or JVD  Back:     Scoliosis, ROM normal, no CVA tenderness  Lungs:     Clear to auscultation bilaterally, respirations unlabored  Chest Wall:    No tenderness or deformity   Heart:    Regular rate and rhythm, S1 and S2 normal, no murmur, rub   or gallop     Abdomen:     Soft, non-tender, bowel sounds active all four quadrants,    no masses, no organomegaly        Extremities:   Extremities normal, atraumatic, no cyanosis or edema  Pulses:   2+ and symmetric all extremities  Skin:   Skin color, texture, turgor normal, no rashes or lesions  Lymph nodes:   Cervical, supraclavicular, and axillary nodes normal  Neurologic:   CNII-XII intact, normal strength, sensation and reflexes    throughout      Assessment & Plan:    Routine Health Maintenance and Physical Exam  Immunization History  Administered Date(s) Administered   Covid-19, Mrna,Vaccine(Spikevax)74yrs and older 01/07/2022   Fluad Quad(high Dose 65+) 12/20/2021   Influenza Inj Mdck Quad Pf 11/22/2017   Influenza,inj,Quad PF,6+ Mos 11/27/2015, 12/15/2016, 12/09/2018, 12/16/2019   Influenza-Unspecified 12/28/2020   Moderna Sars-Covid-2 Vaccination 02/15/2020, 09/22/2020   PFIZER Comirnaty(Gray Top)Covid-19 Tri-Sucrose Vaccine 06/16/2019, 07/11/2019, 01/24/2022   PFIZER(Purple Top)SARS-COV-2 Vaccination 06/16/2019, 07/11/2019   PNEUMOCOCCAL CONJUGATE-20 11/14/2020   PPD Test 09/22/2012   Pfizer Covid-19 Vaccine Bivalent Booster 55yrs & up 12/28/2020   Pneumococcal Polysaccharide-23 07/21/2009   Pneumococcal-Unspecified  07/21/2009   Tdap 01/30/2016   Zoster Recombinat (Shingrix) 07/29/2016, 11/16/2016   Zoster, Live 02/04/2016    Health Maintenance  Topic Date Due   COVID-19 Vaccine (9 - 2023-24 season) 03/21/2022   Medicare Annual Wellness (AWV)  10/01/2022   DEXA SCAN  04/24/2023 (Originally 04/04/2022)   Diabetic kidney evaluation - Urine ACR  10/15/2022   HEMOGLOBIN A1C  10/22/2022   INFLUENZA VACCINE  10/30/2022   FOOT EXAM  01/22/2023   OPHTHALMOLOGY EXAM  04/30/2023   Diabetic kidney evaluation - eGFR measurement  07/09/2023   MAMMOGRAM  04/16/2024   Colonoscopy  12/29/2025   DTaP/Tdap/Td (2 - Td or Tdap) 01/29/2026   Pneumonia Vaccine 61+ Years old  Completed   Hepatitis C Screening  Completed   Zoster Vaccines- Shingrix  Completed   HPV VACCINES  Aged Out    Discussed health benefits of physical activity, and encouraged her to engage in regular exercise appropriate for her age and condition.  Marland KitchenCorrie Dandy was seen today for annual exam.  Diagnoses and all orders for this visit:  Routine health maintenance -     Lipid Panel w/reflex Direct LDL -     COMPLETE METABOLIC PANEL WITH GFR -     TSH -  Hemoglobin A1c -     CBC w/Diff/Platelet  Chronic cough -     montelukast (SINGULAIR) 10 MG tablet; Take 1 tablet (10 mg total) by mouth at bedtime.  Hyperlipidemia LDL goal <70 -     Lipid Panel w/reflex Direct LDL  Prediabetes -     Hemoglobin A1c  Acquired hypothyroidism -     TSH   .Marland Kitchen Discussed 150 minutes of exercise a week.  Encouraged vitamin D 1000 units and Calcium 1300mg  or 4 servings of dairy a day.  Fasting labs ordered PHQ no concerns Cough seems more like PND and allergies Lungs sound good/last CXR was normal  Start singulair at bedtime Colonoscopy UTD Mammogram UTD No need for pap Bone density UTD Consider covid booster Pneumonia/shingles/Tdap UTD  Return in about 3 months (around 12/09/2022).     Tandy Gaw, PA-C

## 2022-09-10 DIAGNOSIS — R7303 Prediabetes: Secondary | ICD-10-CM | POA: Diagnosis not present

## 2022-09-10 DIAGNOSIS — E039 Hypothyroidism, unspecified: Secondary | ICD-10-CM | POA: Diagnosis not present

## 2022-09-10 DIAGNOSIS — Z0000 Encounter for general adult medical examination without abnormal findings: Secondary | ICD-10-CM | POA: Diagnosis not present

## 2022-09-10 DIAGNOSIS — E785 Hyperlipidemia, unspecified: Secondary | ICD-10-CM | POA: Diagnosis not present

## 2022-09-11 LAB — LIPID PANEL W/REFLEX DIRECT LDL
Cholesterol: 153 mg/dL (ref <200)
HDL: 75 mg/dL (ref >=50)
LDL Cholesterol (Calc): 55 mg/dL (calc)
Non-HDL Cholesterol (Calc): 78 mg/dL (calc) (ref <130)
Total CHOL/HDL Ratio: 2 (calc) (ref <5.0)
Triglycerides: 146 mg/dL (ref <150)

## 2022-09-11 LAB — COMPLETE METABOLIC PANEL WITH GFR
AG Ratio: 2.2 (calc) (ref 1.0–2.5)
ALT: 18 U/L (ref 6–29)
AST: 16 U/L (ref 10–35)
Albumin: 4 g/dL (ref 3.6–5.1)
Alkaline phosphatase (APISO): 56 U/L (ref 37–153)
BUN: 16 mg/dL (ref 7–25)
CO2: 27 mmol/L (ref 20–32)
Calcium: 9.1 mg/dL (ref 8.6–10.4)
Chloride: 103 mmol/L (ref 98–110)
Creat: 0.93 mg/dL (ref 0.50–1.05)
Globulin: 1.8 g/dL (calc) — ABNORMAL LOW (ref 1.9–3.7)
Glucose, Bld: 137 mg/dL — ABNORMAL HIGH (ref 65–99)
Potassium: 4.3 mmol/L (ref 3.5–5.3)
Sodium: 140 mmol/L (ref 135–146)
Total Bilirubin: 0.4 mg/dL (ref 0.2–1.2)
Total Protein: 5.8 g/dL — ABNORMAL LOW (ref 6.1–8.1)
eGFR: 68 mL/min/{1.73_m2} (ref >=60)

## 2022-09-11 LAB — CBC WITH DIFFERENTIAL/PLATELET
Absolute Monocytes: 347 cells/uL (ref 200–950)
Basophils Absolute: 32 cells/uL (ref 0–200)
Basophils Relative: 0.7 %
Eosinophils Absolute: 68 cells/uL (ref 15–500)
Eosinophils Relative: 1.5 %
HCT: 41.3 % (ref 35.0–45.0)
Hemoglobin: 13.6 g/dL (ref 11.7–15.5)
Lymphs Abs: 1521 cells/uL (ref 850–3900)
MCH: 29.6 pg (ref 27.0–33.0)
MCHC: 32.9 g/dL (ref 32.0–36.0)
MCV: 90 fL (ref 80.0–100.0)
MPV: 11.5 fL (ref 7.5–12.5)
Monocytes Relative: 7.7 %
Neutro Abs: 2534 cells/uL (ref 1500–7800)
Neutrophils Relative %: 56.3 %
Platelets: 210 10*3/uL (ref 140–400)
RBC: 4.59 10*6/uL (ref 3.80–5.10)
RDW: 14.1 % (ref 11.0–15.0)
Total Lymphocyte: 33.8 %
WBC: 4.5 10*3/uL (ref 3.8–10.8)

## 2022-09-11 LAB — HEMOGLOBIN A1C
Hgb A1c MFr Bld: 7.4 % of total Hgb — ABNORMAL HIGH (ref <5.7)
Mean Plasma Glucose: 166 mg/dL
eAG (mmol/L): 9.2 mmol/L

## 2022-09-11 LAB — TSH: TSH: 1.19 mIU/L (ref 0.40–4.50)

## 2022-09-12 MED ORDER — DAPAGLIFLOZIN PRO-METFORMIN ER 10-1000 MG PO TB24: 1.0000 | ORAL_TABLET | Freq: Every day | ORAL | 0 refills | Status: DC

## 2022-09-12 NOTE — Progress Notes (Signed)
A1C is up from 6.7 to 7.4.  I would like to stop metformin and add metformin and medication to help you urinate off extra sugar called xigduo. It would be together in same pill to take once a day. Thoughts?

## 2022-09-12 NOTE — Addendum Note (Signed)
Addended by: [name] on: 09/12/2022 12:17 PM   Modules accepted: Orders

## 2022-09-15 ENCOUNTER — Encounter: Payer: Self-pay | Admitting: Physician Assistant

## 2022-09-15 DIAGNOSIS — M7061 Trochanteric bursitis, right hip: Secondary | ICD-10-CM | POA: Diagnosis not present

## 2022-09-15 DIAGNOSIS — M461 Sacroiliitis, not elsewhere classified: Secondary | ICD-10-CM | POA: Diagnosis not present

## 2022-09-19 MED ORDER — SYNJARDY 12.5-1000 MG PO TABS: 1.0000 | ORAL_TABLET | Freq: Two times a day (BID) | ORAL | 2 refills | Status: DC

## 2022-09-26 ENCOUNTER — Encounter: Payer: Self-pay | Admitting: Physician Assistant

## 2022-09-29 ENCOUNTER — Encounter: Payer: Self-pay | Admitting: Physician Assistant

## 2022-09-30 MED ORDER — FLUCONAZOLE 150 MG PO TABS: 150.0000 mg | ORAL_TABLET | Freq: Once | ORAL | 0 refills | Status: AC

## 2022-10-06 ENCOUNTER — Ambulatory Visit (INDEPENDENT_AMBULATORY_CARE_PROVIDER_SITE_OTHER): Payer: Medicare HMO | Admitting: Family Medicine

## 2022-10-06 DIAGNOSIS — Z0000 Encounter for general adult medical examination without abnormal findings: Secondary | ICD-10-CM | POA: Diagnosis not present

## 2022-10-06 DIAGNOSIS — Z780 Asymptomatic menopausal state: Secondary | ICD-10-CM

## 2022-10-06 NOTE — Patient Instructions (Addendum)
MEDICARE ANNUAL WELLNESS VISIT Health Maintenance Summary and Written Plan of Care  Leslie Roberson ,  Thank you for allowing me to perform your Medicare Annual Wellness Visit and for your ongoing commitment to your health.   Health Maintenance & Immunization History Health Maintenance  Topic Date Due   Diabetic kidney evaluation - Urine ACR  10/15/2022   COVID-19 Vaccine (9 - 2023-24 season) 10/22/2022 (Originally 03/21/2022)   DEXA SCAN  04/24/2023 (Originally 04/04/2022)   INFLUENZA VACCINE  10/30/2022   FOOT EXAM  01/22/2023   HEMOGLOBIN A1C  03/12/2023   OPHTHALMOLOGY EXAM  04/30/2023   Diabetic kidney evaluation - eGFR measurement  09/10/2023   Medicare Annual Wellness (AWV)  10/06/2023   MAMMOGRAM  04/16/2024   Colonoscopy  12/29/2025   DTaP/Tdap/Td (2 - Td or Tdap) 01/29/2026   Pneumonia Vaccine 55+ Years old  Completed   Hepatitis C Screening  Completed   Zoster Vaccines- Shingrix  Completed   HPV VACCINES  Aged Out   Immunization History  Administered Date(s) Administered   Covid-19, Mrna,Vaccine(Spikevax)86yrs and older 01/07/2022   Fluad Quad(high Dose 65+) 12/20/2021   Influenza Inj Mdck Quad Pf 11/22/2017   Influenza,inj,Quad PF,6+ Mos 11/27/2015, 12/15/2016, 12/09/2018, 12/16/2019   Influenza-Unspecified 12/28/2020   Moderna Sars-Covid-2 Vaccination 02/15/2020, 09/22/2020   PFIZER Comirnaty(Gray Top)Covid-19 Tri-Sucrose Vaccine 06/16/2019, 07/11/2019, 01/24/2022   PFIZER(Purple Top)SARS-COV-2 Vaccination 06/16/2019, 07/11/2019   PNEUMOCOCCAL CONJUGATE-20 11/14/2020   PPD Test 09/22/2012   Pfizer Covid-19 Vaccine Bivalent Booster 73yrs & up 12/28/2020   Pneumococcal Polysaccharide-23 07/21/2009   Pneumococcal-Unspecified 07/21/2009   Tdap 01/30/2016   Zoster Recombinant(Shingrix) 07/29/2016, 11/16/2016   Zoster, Live 02/04/2016    These are the patient goals that we discussed:  Goals Addressed               This Visit's Progress     Patient Stated  (pt-stated)        10/06/2022 AWV Goal: Diabetes Management  Patient will maintain an A1C level below 8.0 Patient will not develop any diabetic foot complications Patient will not experience any hypoglycemic episodes over the next 3 months Patient will notify our office of any CBG readings outside of the provider recommended range by calling [phone] Patient will adhere to provider recommendations for diabetes management  Patient Self Management Activities take all medications as prescribed and report any negative side effects monitor and record blood sugar readings as directed adhere to a low carbohydrate diet that incorporates lean proteins, vegetables, whole grains, low glycemic fruits check feet daily noting any sores, cracks, injuries, or callous formations see PCP or podiatrist if she notices any changes in her legs, feet, or toenails Patient will visit PCP and have an A1C level checked every 3 to 6 months as directed  have a yearly eye exam to monitor for vascular changes associated with diabetes and will request that the report be sent to her pcp.  consult with her PCP regarding any changes in her health or new or worsening symptoms          This is a list of Health Maintenance Items that are overdue or due now: Bone densitometry screening Urine ACR  Orders/Referrals Placed Today: Orders Placed This Encounter  Procedures   DEXAScan    Standing Status:   Future    Standing Expiration Date:   10/06/2023    Scheduling Instructions:     Please call patient to schedule.    Order Specific Question:   Reason for exam:    Answer:  post menopausal    Order Specific Question:   Preferred imaging location?    Answer:   [facility]   (Contact our referral department at [phone] if you have not spoken with someone about your referral appointment within the next 5 days)    Follow-up Plan Follow-up with [name], PA-C as planned Order placed for bone  density scan.  Urine ACR due after 10/15/22.  Medicare wellness visit in one year.  Patient will access AVS on my chart.      Health Maintenance, Female Adopting a healthy lifestyle and getting preventive care are important in promoting health and wellness. Ask your health care provider about: The right schedule for you to have regular tests and exams. Things you can do on your own to prevent diseases and keep yourself healthy. What should I know about diet, weight, and exercise? Eat a healthy diet  Eat a diet that includes plenty of vegetables, fruits, low-fat dairy products, and lean protein. Do not eat a lot of foods that are high in solid fats, added sugars, or sodium. Maintain a healthy weight Body mass index (BMI) is used to identify weight problems. It estimates body fat based on height and weight. Your health care provider can help determine your BMI and help you achieve or maintain a healthy weight. Get regular exercise Get regular exercise. This is one of the most important things you can do for your health. Most adults should: Exercise for at least 150 minutes each week. The exercise should increase your heart rate and make you sweat (moderate-intensity exercise). Do strengthening exercises at least twice a week. This is in addition to the moderate-intensity exercise. Spend less time sitting. Even light physical activity can be beneficial. Watch cholesterol and blood lipids Have your blood tested for lipids and cholesterol at 67 years of age, then have this test every 5 years. Have your cholesterol levels checked more often if: Your lipid or cholesterol levels are high. You are older than 67 years of age. You are at high risk for heart disease. What should I know about cancer screening? Depending on your health history and family history, you may need to have cancer screening at various ages. This may include screening for: Breast cancer. Cervical cancer. Colorectal  cancer. Skin cancer. Lung cancer. What should I know about heart disease, diabetes, and high blood pressure? Blood pressure and heart disease High blood pressure causes heart disease and increases the risk of stroke. This is more likely to develop in people who have high blood pressure readings or are overweight. Have your blood pressure checked: Every 3-5 years if you are 20-103 years of age. Every year if you are 24 years old or older. Diabetes Have regular diabetes screenings. This checks your fasting blood sugar level. Have the screening done: Once every three years after age 61 if you are at a normal weight and have a low risk for diabetes. More often and at a younger age if you are overweight or have a high risk for diabetes. What should I know about preventing infection? Hepatitis B If you have a higher risk for hepatitis B, you should be screened for this virus. Talk with your health care provider to find out if you are at risk for hepatitis B infection. Hepatitis C Testing is recommended for: Everyone born from [birth year] through 1965. Anyone with known risk factors for hepatitis C. Sexually transmitted infections (STIs) Get screened for STIs, including gonorrhea and chlamydia, if: You are sexually  active and are younger than 67 years of age. You are older than 67 years of age and your health care provider tells you that you are at risk for this type of infection. Your sexual activity has changed since you were last screened, and you are at increased risk for chlamydia or gonorrhea. Ask your health care provider if you are at risk. Ask your health care provider about whether you are at high risk for HIV. Your health care provider may recommend a prescription medicine to help prevent HIV infection. If you choose to take medicine to prevent HIV, you should first get tested for HIV. You should then be tested every 3 months for as long as you are taking the medicine. Pregnancy If you are  about to stop having your period (premenopausal) and you may become pregnant, seek counseling before you get pregnant. Take 400 to 800 micrograms (mcg) of folic acid every day if you become pregnant. Ask for birth control (contraception) if you want to prevent pregnancy. Osteoporosis and menopause Osteoporosis is a disease in which the bones lose minerals and strength with aging. This can result in bone fractures. If you are 65 years old or older, or if you are at risk for osteoporosis and fractures, ask your health care provider if you should: Be screened for bone loss. Take a calcium or vitamin D supplement to lower your risk of fractures. Be given hormone replacement therapy (HRT) to treat symptoms of menopause. Follow these instructions at home: Alcohol use Do not drink alcohol if: Your health care provider tells you not to drink. You are pregnant, may be pregnant, or are planning to become pregnant. If you drink alcohol: Limit how much you have to: 0-1 drink a day. Know how much alcohol is in your drink. In the [place] one drink equals one 12 oz bottle of beer (355 mL), one 5 oz glass of wine (148 mL), or one 1 oz glass of hard liquor (44 mL). Lifestyle Do not use any products that contain nicotine or tobacco. These products include cigarettes, chewing tobacco, and vaping devices, such as e-cigarettes. If you need help quitting, ask your health care provider. Do not use street drugs. Do not share needles. Ask your health care provider for help if you need support or information about quitting drugs. General instructions Schedule regular health, dental, and eye exams. Stay current with your vaccines. Tell your health care provider if: You often feel depressed. You have ever been abused or do not feel safe at home. Summary Adopting a healthy lifestyle and getting preventive care are important in promoting health and wellness. Follow your health care provider's instructions about  healthy diet, exercising, and getting tested or screened for diseases. Follow your health care provider's instructions on monitoring your cholesterol and blood pressure. This information is not intended to replace advice given to you by your health care provider. Make sure you discuss any questions you have with your health care provider. Document Revised: 08/06/2020 Document Reviewed: 08/06/2020 Elsevier Patient Education  2024 [employer].

## 2022-10-06 NOTE — Progress Notes (Signed)
MEDICARE ANNUAL WELLNESS VISIT  10/06/2022  Telephone Visit Disclaimer This Medicare AWV was conducted by telephone due to national recommendations for restrictions regarding the COVID-19 Pandemic (e.g. social distancing).  I verified, using two identifiers, that I am speaking with Leslie Roberson or their authorized healthcare agent. I discussed the limitations, risks, security, and privacy concerns of performing an evaluation and management service by telephone and the potential availability of an in-person appointment in the future. The patient expressed understanding and agreed to proceed.  Location of Patient: Home Location of Provider (nurse):  Provider home  Subjective:    Leslie Roberson is a 67 y.o. female patient of [name], PA-C who had a [employer] Visit today via telephone. Leslie Roberson is Retired and lives with their spouse. she has 1 child. she reports that she is socially active and does interact with friends/family regularly. she is moderately physically active and enjoys playing games on the computer.  Patient Care Team: Nolene Ebbs as PCP - General (Family Medicine) Lewayne Bunting, MD as PCP - Cardiology (Cardiology)     10/06/2022    1:23 PM 11/27/2021    3:28 PM 09/30/2021    2:02 PM 01/04/2019    8:03 AM 07/07/2017    2:13 PM 07/07/2017    6:39 AM 06/27/2017   12:00 AM  Advanced Directives  Does Patient Have a Medical Advance Directive? Yes No Yes Yes  Yes Yes  Type of Advance Directive Living will  Living will Healthcare Power of [place];Living will  Healthcare Power of [place];Living will   Does patient want to make changes to medical advance directive? No - Patient declined  No - Patient declined No - Patient declined  No - Patient declined   Copy of Healthcare Power of Attorney in Chart?     No - copy requested No - copy requested   Would patient like information on creating a medical advance directive?  No - Patient declined          Hospital Utilization Over the Past 12 Months: # of hospitalizations or ER visits: 1 # of surgeries: 1  Review of Systems    Patient reports that her overall health is unchanged compared to last year.  History obtained from chart review and the patient  Patient Reported Readings (BP, Pulse, CBG, Weight, etc) none  Pain Assessment Pain : No/denies pain     Current Medications & Allergies (verified) Allergies as of 10/06/2022       Reactions   Hydromorphone Rash   Levofloxacin Other (See Comments)   Hallucinations   Meperidine Nausea And Vomiting, Other (See Comments)   Also "doesn't work well for my pain"   Broccoli [brassica Oleracea] Nausea Only, Other (See Comments)   Causes stomach pain also   Adhesive [tape] Rash        Medication List        Accurate as of October 06, 2022  1:36 PM. If you have any questions, ask your nurse or doctor.          STOP taking these medications    metFORMIN 500 MG 24 hr tablet Commonly known as: GLUCOPHAGE-XR   montelukast 10 MG tablet Commonly known as: SINGULAIR   Synjardy 12.07-998 MG Tabs Generic drug: Empagliflozin-metFORMIN HCl       TAKE these medications    albuterol 108 (90 Base) MCG/ACT inhaler Commonly known as: VENTOLIN HFA Inhale 1-2 puffs into the lungs every 4 (four) hours  as needed for wheezing or shortness of breath.   aspirin EC 81 MG tablet Take 81 mg by mouth at bedtime.   atorvastatin 20 MG tablet Commonly known as: LIPITOR Take 1 tablet (20 mg total) by mouth daily.   busPIRone 15 MG tablet Commonly known as: BUSPAR Take 60 mg by mouth daily.   calcium-vitamin D 500-200 MG-UNIT tablet Commonly known as: OSCAL WITH D Take 1 tablet by mouth daily.   clonazePAM 0.5 MG tablet Commonly known as: KLONOPIN Take 0.25 mg by mouth at bedtime.   Dapagliflozin Pro-metFORMIN ER 12-998 MG Tb24 Commonly known as: Xigduo XR Take 1 tablet by mouth daily.   denosumab 60 MG/ML Soln  injection Commonly known as: PROLIA Inject 60 mg into the skin every 6 (six) months. Administer in upper arm, thigh, or abdomen   desvenlafaxine 100 MG 24 hr tablet Commonly known as: PRISTIQ Take 100 mg by mouth in the morning.   eszopiclone 2 MG Tabs tablet Commonly known as: LUNESTA Take 2 mg by mouth at bedtime.   FISH OIL PO Take 1 capsule by mouth daily.   gabapentin 300 MG capsule Commonly known as: NEURONTIN TAKE 1 CAPSULE IN THE MORNING AND 2 CAPSULES AT BEDTIME   ketoconazole 2 % shampoo Commonly known as: NIZORAL Apply topically.   levothyroxine 137 MCG tablet Commonly known as: SYNTHROID TAKE 1 TABLET (137 MCG TOTAL) once a week then 1/2 tablet all other days.   metoprolol succinate 25 MG 24 hr tablet Commonly known as: TOPROL-XL TAKE 1 TABLET (25 MG TOTAL) BY MOUTH DAILY.   MINERAL ICE EX Apply 1 application to affected sites one to two times a day as needed (for pain)   nitroGLYCERIN 0.4 MG SL tablet Commonly known as: NITROSTAT DISSOLVE 1 TAB UNDER THE TONGUE FOR CHEST PAIN EVERY 5 MINUTES UP TO 3 DOSES IF PAIN PERSIST CALL 911/SEEK MEDICAL HELP   oxybutynin 5 MG 24 hr tablet Commonly known as: DITROPAN-XL TAKE 1 TABLET EVERY DAY   pantoprazole 40 MG tablet Commonly known as: PROTONIX Take 1 tablet (40 mg total) by mouth 2 (two) times daily before a meal.   Rexulti 1 MG Tabs tablet Generic drug: brexpiprazole Take 1 tablet by mouth daily.   tolterodine 2 MG 24 hr capsule Commonly known as: DETROL LA Take 2 mg by mouth in the morning.   trazodone 300 MG tablet Commonly known as: DESYREL Take 300 mg by mouth at bedtime.        History (reviewed): Past Medical History:  Diagnosis Date   Allergy 1992   Anxiety    Arthritis    "right little finger; base of thumb left hand; spine" (07/07/2017)   Asthma, chronic 11/27/2015   Cataract 2021   Chronic back pain    Coronary artery disease    4/19 PCI/DEx2 to LAD, and PCI/DES x1 to RCA, normal  EF   Depression    Diabetes mellitus without complication (HCC) 2020   Diverticulitis of colon 12/21/2015   Colonoscopy every 5 years/digestive health.    GERD (gastroesophageal reflux disease) 11/27/2015   History of blood transfusion    "related to spinal fusions"   Hyperlipidemia LDL goal <70 11/29/2018   Hypertension    Hypothyroidism    Idiopathic scoliosis 01/22/2016   Osteoporosis 11/27/2015   On prolia.    Pneumonia 2016   Pneumothorax 06/26/2017   Pre-diabetes    Sleep apnea 2020   Thyroid disease    Past Surgical History:  Procedure Laterality  Date   ABDOMINAL HYSTERECTOMY     bladder mesh     S/P bladder sling"   COLON SURGERY  2007   CORONARY PRESSURE/FFR STUDY N/A 07/07/2017   Procedure: INTRAVASCULAR PRESSURE WIRE/FFR STUDY;  Surgeon: Marykay Lex, MD;  Location[facility] INVASIVE CV LAB;  Service: Cardiovascular;  Laterality: N/A;   CORONARY STENT INTERVENTION N/A 07/07/2017   Procedure: CORONARY STENT INTERVENTION;  Surgeon: Marykay Lex, MD;  Location: [facility] INVASIVE CV LAB;  Service: Cardiovascular;  Laterality: N/A;   ELBOW SURGERY Right    "dislocation"   INCONTINENCE SURGERY     "didn't work"   JOINT REPLACEMENT     LEFT HEART CATH AND CORONARY ANGIOGRAPHY N/A 07/07/2017   Procedure: LEFT HEART CATH AND CORONARY ANGIOGRAPHY;  Surgeon: Marykay Lex, MD;  Location: [facility] INVASIVE CV LAB;  Service: Cardiovascular;  Laterality: N/A;   SMALL INTESTINE SURGERY     SPINAL FUSION  ~ 1970 X 3   "below neck - lower back"   TONSILLECTOMY     TOTAL HIP ARTHROPLASTY Left    Family History  Problem Relation Age of Onset   Cancer Mother        lung   Depression Mother    Early death Mother    Heart attack Father    Hyperlipidemia Father    Hypertension Father    Arthritis Father    Heart disease Father    Diabetes Maternal Aunt    Hyperlipidemia Paternal Aunt    COPD Paternal Aunt    Hypertension Paternal Aunt    Stroke Paternal Aunt    Social History    Socioeconomic History   Marital status: Married    Spouse name: [name]   Number of children: 1   Years of education: 16   Highest education level: Bachelor's degree (e.g., BA, AB, BS)  Occupational History   Occupation: Retired  Tobacco Use   Smoking status: Never   Smokeless tobacco: Never  Vaping Use   Vaping Use: Never used  Substance and Sexual Activity   Alcohol use: Not Currently    Comment: Maybe 3 drinks/year   Drug use: No   Sexual activity: Not Currently    Birth control/protection: None  Other Topics Concern   Not on file  Social History Narrative   Lives with her husband. She has one child. She enjoys playing games on the computer.   Social Determinants of Health   Financial Resource Strain: Low Risk  (09/29/2022)   Overall Financial Resource Strain (CARDIA)    Difficulty of Paying Living Expenses: Not hard at all  Food Insecurity: No Food Insecurity (09/29/2022)   Hunger Vital Sign    Worried About Running Out of Food in the Last Year: Never true    Ran Out of Food in the Last Year: Never true  Transportation Needs: No Transportation Needs (10/06/2022)   PRAPARE - Administrator, Civil Service (Medical): No    Lack of Transportation (Non-Medical): No  Physical Activity: Inactive (09/29/2022)   Exercise Vital Sign    Days of Exercise per Week: 0 days    Minutes of Exercise per Session: 0 min  Stress: No Stress Concern Present (09/29/2022)   [employer] of Occupational Health - Occupational Stress Questionnaire    Feeling of Stress : Only a little  Social Connections: Socially Isolated (10/06/2022)   Social Connection and Isolation Panel [NHANES]    Frequency of Communication with Friends and Family: Once a week  Frequency of Social Gatherings with Friends and Family: Once a week    Attends Religious Services: Never    Database administrator or Organizations: No    Attends Banker Meetings: Never    Marital Status: Married     Activities of Daily Living    09/29/2022   11:54 AM 07/29/2022    1:16 PM  In your present state of health, do you have any difficulty performing the following activities:  Hearing? 0 0  Vision? 0 0  Difficulty concentrating or making decisions? 0 0  Walking or climbing stairs? 1 0  Dressing or bathing? 0 0  Doing errands, shopping? 0 0  Preparing Food and eating ? N   Using the Toilet? N   In the past six months, have you accidently leaked urine? Y   Do you have problems with loss of bowel control? N   Managing your Medications? N   Managing your Finances? N   Housekeeping or managing your Housekeeping? Y     Patient Education/ Literacy How often do you need to have someone help you when you read instructions, pamphlets, or other written materials from your doctor or pharmacy?: 1 - Never What is the last grade level you completed in school?: Bachelor's degree  Exercise    Diet Patient reports consuming  2-3  meals a day and 1 snack(s) a day Patient reports that her primary diet is: Regular Patient reports that she does have regular access to food.   Depression Screen    10/06/2022    1:25 PM 09/08/2022    4:45 PM 04/23/2022    3:03 PM 10/14/2021    3:17 PM 09/30/2021    2:03 PM 05/14/2021    2:08 PM 11/14/2020    2:26 PM  PHQ 2/9 Scores  PHQ - 2 Score 0 2 0 0 0 2 1  PHQ- 9 Score  5    3 5      Fall Risk    10/06/2022    1:24 PM 09/29/2022   11:54 AM 09/08/2022    3:05 PM 07/29/2022    1:16 PM 04/23/2022    3:03 PM  Fall Risk   Falls in the past year? 0 0 0 0 0  Number falls in past yr: 0 0 0 0 0  Injury with Fall? 0 0 0 0 0  Risk for fall due to : No Fall Risks   No Fall Risks No Fall Risks  Follow up Falls evaluation completed   Falls evaluation completed Falls evaluation completed     Objective:  Leslie Bali Kelter seemed alert and oriented and she participated appropriately during our telephone visit.  Blood Pressure Weight BMI  BP Readings from Last 3  Encounters:  09/08/22 (!) 115/55  08/11/22 (!) 98/52  08/05/22 (!) 95/59   Wt Readings from Last 3 Encounters:  09/08/22 127 lb 4 oz (57.7 kg)  08/12/22 129 lb (58.5 kg)  08/11/22 129 lb 3.2 oz (58.6 kg)   BMI Readings from Last 1 Encounters:  09/08/22 26.60 kg/m    *Unable to obtain current vital signs, weight, and BMI due to telephone visit type  Hearing/Vision  Corrie Dandy did not seem to have difficulty with hearing/understanding during the telephone conversation Reports that she has had a formal eye exam by an eye care professional within the past year Reports that she has had a formal hearing evaluation within the past year *Unable to fully assess hearing and vision during telephone  visit type  Cognitive Function:    10/06/2022    1:28 PM 09/30/2021    2:08 PM  6CIT Screen  What Year? 0 points 0 points  What month? 0 points 0 points  What time? 0 points 0 points  Count back from 20 0 points 0 points  Months in reverse 0 points 0 points  Repeat phrase 0 points 0 points  Total Score 0 points 0 points   (Normal:0-7, Significant for Dysfunction: >8)  Normal Cognitive Function Screening: Yes   Immunization & Health Maintenance Record Immunization History  Administered Date(s) Administered   Covid-19, Mrna,Vaccine(Spikevax)36yrs and older 01/07/2022   Fluad Quad(high Dose 65+) 12/20/2021   Influenza Inj Mdck Quad Pf 11/22/2017   Influenza,inj,Quad PF,6+ Mos 11/27/2015, 12/15/2016, 12/09/2018, 12/16/2019   Influenza-Unspecified 12/28/2020   Moderna Sars-Covid-2 Vaccination 02/15/2020, 09/22/2020   PFIZER Comirnaty(Gray Top)Covid-19 Tri-Sucrose Vaccine 06/16/2019, 07/11/2019, 01/24/2022   PFIZER(Purple Top)SARS-COV-2 Vaccination 06/16/2019, 07/11/2019   PNEUMOCOCCAL CONJUGATE-20 11/14/2020   PPD Test 09/22/2012   Pfizer Covid-19 Vaccine Bivalent Booster 79yrs & up 12/28/2020   Pneumococcal Polysaccharide-23 07/21/2009   Pneumococcal-Unspecified 07/21/2009   Tdap 01/30/2016    Zoster Recombinant(Shingrix) 07/29/2016, 11/16/2016   Zoster, Live 02/04/2016    Health Maintenance  Topic Date Due   Diabetic kidney evaluation - Urine ACR  10/15/2022   COVID-19 Vaccine (9 - 2023-24 season) 10/22/2022 (Originally 03/21/2022)   DEXA SCAN  04/24/2023 (Originally 04/04/2022)   INFLUENZA VACCINE  10/30/2022   FOOT EXAM  01/22/2023   HEMOGLOBIN A1C  03/12/2023   OPHTHALMOLOGY EXAM  04/30/2023   Diabetic kidney evaluation - eGFR measurement  09/10/2023   Medicare Annual Wellness (AWV)  10/06/2023   MAMMOGRAM  04/16/2024   Colonoscopy  12/29/2025   DTaP/Tdap/Td (2 - Td or Tdap) 01/29/2026   Pneumonia Vaccine 23+ Years old  Completed   Hepatitis C Screening  Completed   Zoster Vaccines- Shingrix  Completed   HPV VACCINES  Aged Out       Assessment  This is a routine wellness examination for [employer] [place].  Health Maintenance: Due or Overdue Health Maintenance Due  Topic Date Due   Diabetic kidney evaluation - Urine ACR  10/15/2022    Leslie Bali Kaczorowski does not need a referral for Community Assistance: Care Management:   no Social Work:    no Prescription Assistance:  no Nutrition/Diabetes Education:  no   Plan:  Personalized Goals  Goals Addressed               This Visit's Progress     Patient Stated (pt-stated)        10/06/2022 AWV Goal: Diabetes Management  Patient will maintain an A1C level below 8.0 Patient will not develop any diabetic foot complications Patient will not experience any hypoglycemic episodes over the next 3 months Patient will notify our office of any CBG readings outside of the provider recommended range by calling [phone] Patient will adhere to provider recommendations for diabetes management  Patient Self Management Activities take all medications as prescribed and report any negative side effects monitor and record blood sugar readings as directed adhere to a low carbohydrate diet that incorporates lean  proteins, vegetables, whole grains, low glycemic fruits check feet daily noting any sores, cracks, injuries, or callous formations see PCP or podiatrist if she notices any changes in her legs, feet, or toenails Patient will visit PCP and have an A1C level checked every 3 to 6 months as directed  have a yearly eye exam  to monitor for vascular changes associated with diabetes and will request that the report be sent to her pcp.  consult with her PCP regarding any changes in her health or new or worsening symptoms        Personalized Health Maintenance & Screening Recommendations  Bone densitometry screening Urine ACR  Lung Cancer Screening Recommended: no (Low Dose CT Chest recommended if Age 97-80 years, 20 pack-year currently smoking OR have quit w/in past 15 years) Hepatitis C Screening recommended: no HIV Screening recommended: no  Advanced Directives: Written information was not prepared per patient's request.  Referrals & Orders Orders Placed This Encounter  Procedures   DEXAScan    Follow-up Plan Follow-up with Tandy Gaw L, PA-C as planned Order placed for bone density scan.  Urine ACR due after 10/15/22.  Medicare wellness visit in one year.  Patient will access AVS on my chart.   I have personally reviewed and noted the following in the patient's chart:   Medical and social history Use of alcohol, tobacco or illicit drugs  Current medications and supplements Functional ability and status Nutritional status Physical activity Advanced directives List of other physicians Hospitalizations, surgeries, and ER visits in previous 12 months Vitals Screenings to include cognitive, depression, and falls Referrals and appointments  In addition, I have reviewed and discussed with Leslie Bali Dueitt certain preventive protocols, quality metrics, and best practice recommendations. A written personalized care plan for preventive services as well as general preventive  health recommendations is available and can be mailed to the patient at her request.      Modesto Charon, RN BSN  10/06/2022

## 2022-10-20 DIAGNOSIS — F333 Major depressive disorder, recurrent, severe with psychotic symptoms: Secondary | ICD-10-CM | POA: Diagnosis not present

## 2022-11-10 ENCOUNTER — Other Ambulatory Visit: Payer: Self-pay | Admitting: Physician Assistant

## 2022-11-12 ENCOUNTER — Ambulatory Visit (INDEPENDENT_AMBULATORY_CARE_PROVIDER_SITE_OTHER): Payer: Medicare HMO

## 2022-11-12 DIAGNOSIS — M8580 Other specified disorders of bone density and structure, unspecified site: Secondary | ICD-10-CM | POA: Diagnosis not present

## 2022-11-12 DIAGNOSIS — Z0000 Encounter for general adult medical examination without abnormal findings: Secondary | ICD-10-CM

## 2022-11-12 DIAGNOSIS — Z780 Asymptomatic menopausal state: Secondary | ICD-10-CM

## 2022-11-12 NOTE — Progress Notes (Signed)
Hi Leslie Roberson, your bone density shows a T-score of -2.5.  This is consistent with a condition of thin bones called osteoporosis.  There is no statistically significant change since your prior bone density in 2022.  Thus it is stable.  So continue with the Prolia injections as well as your calcium and vitamin D supplementation and weightbearing/resistance exercises.

## 2022-11-18 ENCOUNTER — Encounter: Payer: Self-pay | Admitting: Physician Assistant

## 2022-11-18 ENCOUNTER — Other Ambulatory Visit: Payer: Self-pay | Admitting: Physician Assistant

## 2022-11-18 DIAGNOSIS — E039 Hypothyroidism, unspecified: Secondary | ICD-10-CM

## 2022-11-18 MED ORDER — DAPAGLIFLOZIN PRO-METFORMIN ER 10-1000 MG PO TB24: 1.0000 | ORAL_TABLET | Freq: Every day | ORAL | 1 refills | Status: DC

## 2022-11-24 ENCOUNTER — Other Ambulatory Visit (HOSPITAL_BASED_OUTPATIENT_CLINIC_OR_DEPARTMENT_OTHER): Payer: Self-pay | Admitting: Family

## 2022-11-24 DIAGNOSIS — E785 Hyperlipidemia, unspecified: Secondary | ICD-10-CM

## 2022-11-24 DIAGNOSIS — I25118 Atherosclerotic heart disease of native coronary artery with other forms of angina pectoris: Secondary | ICD-10-CM

## 2022-11-24 NOTE — Telephone Encounter (Signed)
Patient of Dr. Stanford Breed. Please review for refill. Thank you!

## 2022-11-26 ENCOUNTER — Other Ambulatory Visit: Payer: Self-pay

## 2022-11-26 DIAGNOSIS — I25118 Atherosclerotic heart disease of native coronary artery with other forms of angina pectoris: Secondary | ICD-10-CM

## 2022-11-26 DIAGNOSIS — E785 Hyperlipidemia, unspecified: Secondary | ICD-10-CM

## 2022-11-26 MED ORDER — METOPROLOL SUCCINATE ER 25 MG PO TB24
ORAL_TABLET | ORAL | 2 refills | Status: DC
Start: 2022-11-26 — End: 2023-10-15

## 2022-11-26 MED ORDER — ATORVASTATIN CALCIUM 20 MG PO TABS: 20.0000 mg | ORAL_TABLET | Freq: Every day | ORAL | 2 refills | Status: DC

## 2022-11-26 NOTE — Telephone Encounter (Signed)
Pt's medications were sent to pt's pharmacy as requested. Confirmation received.  

## 2022-12-03 DIAGNOSIS — M25551 Pain in right hip: Secondary | ICD-10-CM | POA: Diagnosis not present

## 2022-12-03 DIAGNOSIS — M461 Sacroiliitis, not elsewhere classified: Secondary | ICD-10-CM | POA: Diagnosis not present

## 2022-12-03 DIAGNOSIS — M7061 Trochanteric bursitis, right hip: Secondary | ICD-10-CM | POA: Diagnosis not present

## 2022-12-08 DIAGNOSIS — M25551 Pain in right hip: Secondary | ICD-10-CM | POA: Diagnosis not present

## 2022-12-08 DIAGNOSIS — M7061 Trochanteric bursitis, right hip: Secondary | ICD-10-CM | POA: Diagnosis not present

## 2022-12-09 ENCOUNTER — Ambulatory Visit (INDEPENDENT_AMBULATORY_CARE_PROVIDER_SITE_OTHER): Payer: Medicare HMO | Admitting: Physician Assistant

## 2022-12-09 ENCOUNTER — Encounter: Payer: Self-pay | Admitting: Physician Assistant

## 2022-12-09 VITALS — BP 120/60 | HR 60 | Ht <= 58 in | Wt 125.0 lb

## 2022-12-09 DIAGNOSIS — L03011 Cellulitis of right finger: Secondary | ICD-10-CM

## 2022-12-09 DIAGNOSIS — E785 Hyperlipidemia, unspecified: Secondary | ICD-10-CM | POA: Diagnosis not present

## 2022-12-09 DIAGNOSIS — E118 Type 2 diabetes mellitus with unspecified complications: Secondary | ICD-10-CM | POA: Diagnosis not present

## 2022-12-09 DIAGNOSIS — E039 Hypothyroidism, unspecified: Secondary | ICD-10-CM

## 2022-12-09 LAB — POCT GLYCOSYLATED HEMOGLOBIN (HGB A1C): Hemoglobin A1C: 6 % — AB (ref 4.0–5.6)

## 2022-12-09 MED ORDER — MUPIROCIN 2 % EX OINT: TOPICAL_OINTMENT | CUTANEOUS | 0 refills | Status: DC

## 2022-12-09 MED ORDER — DOXYCYCLINE HYCLATE 100 MG PO TABS: 100.0000 mg | ORAL_TABLET | Freq: Two times a day (BID) | ORAL | 0 refills | Status: DC

## 2022-12-09 NOTE — Patient Instructions (Signed)
Paronychia Paronychia is an infection of the skin that surrounds a nail. It usually affects the skin around a fingernail, but it may also occur near a toenail. It often causes pain and swelling around the nail. In some cases, a collection of pus (abscess) can form near or under the nail.  This condition may develop suddenly, or it may develop gradually over a longer period. In most cases, paronychia is not serious, and it will clear up with treatment. What are the causes? This condition may be caused by bacteria or a fungus, such as yeast. The bacteria or fungus can enter the body through an opening in the skin, such as a cut or a hangnail, and cause an infection in your fingernail or toenail. Other causes may include: Recurrent injury to the fingernail or toenail area. Irritation of the base and sides of the nail (cuticle). Injury and irritation can result in inflammation, swelling, and thickened skin around the nail. What increases the risk? This condition is more likely to develop in people who: Get their hands wet often, such as those who work as dishwashers, bartenders, or housekeepers. Bite their fingernails or cuticles. Have underlying skin conditions. Have hangnails or injured fingertips. Are exposed to irritants like detergents and other chemicals. Have diabetes. What are the signs or symptoms? Symptoms of this condition include: Redness and swelling of the skin near the nail. Tenderness around the nail when you touch the area. Pus-filled bumps under the cuticle. Fluid or pus under the nail. Throbbing pain in the area. How is this diagnosed? This condition is diagnosed with a physical exam. In some cases, a sample of pus may be tested to determine what type of bacteria or fungus is causing the condition. How is this treated? Treatment depends on the cause and severity of your condition. If your condition is mild, it may clear up on its own in a few days or after soaking in warm  water. If needed, treatment may include: Antibiotic medicine, if your infection is caused by bacteria. Antifungal medicine, if your infection is caused by a fungus. A procedure to drain pus from an abscess. Anti-inflammatory medicine (corticosteroids). Removal of part of an ingrown toenail. A bandage (dressing) may be placed over the affected area if an abscess or part of a nail has been removed. Follow these instructions at home: Wound care Keep the affected area clean. Soak the affected area in warm water if told to do so by your health care provider. You may be told to do this for 20 minutes, 2-3 times a day. Keep the area dry when you are not soaking it. Do not try to drain an abscess yourself. Follow instructions from your health care provider about how to take care of the affected area. Make sure you: Wash your hands with soap and water for at least 20 seconds before and after you change your dressing. If soap and water are not available, use hand sanitizer. Change your dressing as told by your health care provider. If you had an abscess drained, check the area every day for signs of infection. Check for: Redness, swelling, or pain. Fluid or blood. Warmth. Pus or a bad smell. Medicines  Take over-the-counter and prescription medicines only as told by your health care provider. If you were prescribed an antibiotic medicine, take it as told by your health care provider. Do not stop taking the antibiotic even if you start to feel better. General instructions Avoid contact with any skin irritants or allergens.   Do not pick at the affected area. Keep all follow-up visits as told. This is important. Prevention To prevent this condition from happening again: Wear rubber gloves when washing dishes or doing other tasks that require your hands to get wet. Wear gloves if your hands might come in contact with cleaners or other chemicals. Avoid injuring your nails or fingertips. Do not bite  your nails or tear hangnails. Do not cut your nails very short. Do not cut your cuticles. Use clean nail clippers or scissors when trimming nails. Contact a health care provider if: Your symptoms get worse or do not improve with treatment. You have continued or increased fluid, blood, or pus coming from the affected area. Your affected finger, toe, or joint becomes swollen or difficult to move. You have a fever or chills. There is redness spreading away from the affected area. Summary Paronychia is an infection of the skin that surrounds a nail. It often causes pain and swelling around the nail. In some cases, a collection of pus (abscess) can form near or under the nail. This condition may be caused by bacteria or a fungus. These germs can enter the body through an opening in the skin, such as a cut or a hangnail. If your condition is mild, it may clear up on its own in a few days. If needed, treatment may include medicine or a procedure to drain pus from an abscess. To prevent this condition from happening again, wear gloves if doing tasks that require your hands to get wet or to come in contact with chemicals. Also avoid injuring your nails or fingertips. This information is not intended to replace advice given to you by your health care provider. Make sure you discuss any questions you have with your health care provider. Document Revised: 06/18/2020 Document Reviewed: 06/18/2020 Elsevier Patient Education  2024 [employer].

## 2022-12-10 ENCOUNTER — Encounter: Payer: Self-pay | Admitting: Physician Assistant

## 2022-12-10 LAB — CMP14+EGFR
ALT: 21 IU/L (ref 0–32)
AST: 19 IU/L (ref 0–40)
Albumin: 4.4 g/dL (ref 3.9–4.9)
Alkaline Phosphatase: 64 IU/L (ref 44–121)
BUN/Creatinine Ratio: 18 (ref 12–28)
BUN: 17 mg/dL (ref 8–27)
Bilirubin Total: <0.2 mg/dL (ref 0.0–1.2)
CO2: 23 mmol/L (ref 20–29)
Calcium: 9.9 mg/dL (ref 8.7–10.3)
Chloride: 102 mmol/L (ref 96–106)
Creatinine, Ser: 0.94 mg/dL (ref 0.57–1.00)
Globulin, Total: 1.9 g/dL (ref 1.5–4.5)
Glucose: 113 mg/dL — ABNORMAL HIGH (ref 70–99)
Potassium: 4.6 mmol/L (ref 3.5–5.2)
Sodium: 143 mmol/L (ref 134–144)
Total Protein: 6.3 g/dL (ref 6.0–8.5)
eGFR: 67 mL/min/{1.73_m2} (ref >59)

## 2022-12-10 NOTE — Progress Notes (Signed)
Kidney, liver, protein look great!

## 2022-12-10 NOTE — Progress Notes (Signed)
Established Patient Office Visit  Subjective   Patient ID: Aloma Ordner, female    DOB: [birth year]  Age: 67 y.o. MRN: [id number]  Chief Complaint  Patient presents with   Medical Management of Chronic Issues    HPI Pt is a 67 yo female who presents to the clinic for T2DM and 3 month follow up.   She is doing well. She is not checking sugars. She is taking xigduo. No open sores or wounds. No SOB palpitations, headaches or vision changes. She has occasional CP that is relieved by rest.   Not concerns with mood.   Concerned about her right middle finger cuticle being infected. No injury. It is swollen and red. Not done anything to make better.  Active Ambulatory Problems    Diagnosis Date Noted   GERD (gastroesophageal reflux disease) 11/27/2015   Osteoporosis 11/27/2015   GAD (generalized anxiety disorder) 11/27/2015   MDD (major depressive disorder), recurrent, in full remission (HCC) 11/27/2015   Chronic dryness of both eyes 11/27/2015   Acquired hypothyroidism 11/27/2015   Asthma, chronic 11/27/2015   Insomnia 11/27/2015   Seborrheic keratoses 11/27/2015   Diverticulitis of colon 12/21/2015   Hip bursitis 12/25/2015   Biceps tendonitis on right 12/25/2015   Idiopathic scoliosis 01/22/2016   Vaginal atrophy 01/31/2016   Rosacea 02/04/2016   Right knee pain 02/19/2016   Paresthesia 04/03/2016   Facet hypertrophy of lumbosacral region 04/03/2016   Chronic back pain 04/03/2016   Bad taste in mouth 07/26/2016   No energy 09/15/2016   Lump in throat 01/27/2017   Dysphagia 01/27/2017   Chronic tension-type headache, intractable 02/05/2017   Prediabetes 02/05/2017   Right leg pain 02/06/2017   Atypical chest pain 05/24/2017   Itchy scalp 05/24/2017   Pneumothorax 06/25/2017   Angina, class III (HCC) 07/07/2017   CAD S/P percutaneous coronary angioplasty    Tongue mass 08/14/2017   Excessive sweating 09/22/2017   Rhinorrhea 01/04/2018   Arthralgia 02/05/2018    Nasal dryness 02/06/2018   Hyperlipidemia LDL goal <70 11/29/2018   DJD (degenerative joint disease), lumbosacral 11/29/2018   Pre-operative clearance 11/29/2018   Diabetes mellitus without complication (HCC) 02/07/2019   Migraine without aura and without status migrainosus, not intractable 02/08/2019   OAB (overactive bladder) 02/08/2019   Snoring 05/02/2019   Non-restorative sleep 05/02/2019   OSA on CPAP 05/20/2019   Ingrown right greater toenail 07/19/2019   Status post hip replacement, left 07/19/2019   Left hip pain 07/19/2019   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   Acute conjunctivitis of both eyes 01/23/2020   CAD in native artery 11/27/2015   S/P cardiac cath 07/07/2017   Paroxysmal nocturnal dyspnea 05/14/2020   Orthopnea 05/14/2020   Cough 05/18/2020   Controlled type 2 diabetes mellitus with complication, without long-term current use of insulin (HCC) 11/16/2020   Right hand pain 11/16/2020   Fall 11/16/2020   Bilateral hearing loss 04/05/2021   Impacted cerumen, left ear 04/05/2021   Ear fullness, left 04/08/2021   Mass of right axilla 05/15/2021   Closed dislocation of metacarpophalangeal joint of right thumb 11/18/2021   Community acquired pneumonia of left lower lobe of lung 12/11/2021   Sinobronchitis 07/29/2022   Need for antibiotic prophylaxis for dental procedure 08/05/2022   Routine health maintenance 09/08/2022   Resolved Ambulatory Problems    Diagnosis Date Noted   Acute medial meniscus tear 04/23/2016   Left knee pain 04/28/2016   Mild intermittent asthma without complication 08/18/2016  Past Medical History:  Diagnosis Date   Allergy 1992   Anxiety    Arthritis    Cataract 2021   Coronary artery disease    Depression    History of blood transfusion    Hypertension    Hypothyroidism    Pneumonia 2016   Pre-diabetes    Sleep apnea 2020   Thyroid disease     Review of Systems  All other systems reviewed and are  negative.     Objective:     BP 120/60   Pulse 60   Ht 4\' 10"  (1.473 m)   Wt 125 lb (56.7 kg)   SpO2 99%   BMI 26.13 kg/m  BP Readings from Last 3 Encounters:  12/09/22 120/60  09/08/22 (!) 115/55  08/11/22 (!) 98/52   Wt Readings from Last 3 Encounters:  12/09/22 125 lb (56.7 kg)  09/08/22 127 lb 4 oz (57.7 kg)  08/12/22 129 lb (58.5 kg)      Physical Exam Constitutional:      Appearance: Normal appearance.  HENT:     Head: Normocephalic.  Cardiovascular:     Rate and Rhythm: Normal rate and regular rhythm.  Pulmonary:     Effort: Pulmonary effort is normal.  Skin:    Comments: Right middle nail cuticle swollen and erythematous with no evidence of pus. Mild tenderness to palpation.   Neurological:     General: No focal deficit present.     Mental Status: She is alert and oriented to person, place, and time.  Psychiatric:        Mood and Affect: Mood normal.      Results for orders placed or performed in visit on 12/09/22  CMP14+EGFR  Result Value Ref Range   Glucose 113 (H) 70 - 99 mg/dL   BUN 17 8 - 27 mg/dL   Creatinine, Ser 4.09 0.57 - 1.00 mg/dL   eGFR 67 >81 XB/JYN/8.29   BUN/Creatinine Ratio 18 12 - 28   Sodium 143 134 - 144 mmol/L   Potassium 4.6 3.5 - 5.2 mmol/L   Chloride 102 96 - 106 mmol/L   CO2 23 20 - 29 mmol/L   Calcium 9.9 8.7 - 10.3 mg/dL   Total Protein 6.3 6.0 - 8.5 g/dL   Albumin 4.4 3.9 - 4.9 g/dL   Globulin, Total 1.9 1.5 - 4.5 g/dL   Bilirubin Total <5.6 0.0 - 1.2 mg/dL   Alkaline Phosphatase 64 44 - 121 IU/L   AST 19 0 - 40 IU/L   ALT 21 0 - 32 IU/L  POCT HgB A1C  Result Value Ref Range   Hemoglobin A1C 6.0 (A) 4.0 - 5.6 %   HbA1c POC (<> result, manual entry)     HbA1c, POC (prediabetic range)     HbA1c, POC (controlled diabetic range)       The 10-year ASCVD risk score (Arnett DK, et al., 2019) is: 12.3%    Assessment & Plan:  Marland KitchenMarland KitchenMary "Marcellene Raggs" was seen today for medical management of chronic  issues.  Diagnoses and all orders for this visit:  Controlled type 2 diabetes mellitus with complication, without long-term current use of insulin (HCC) -     POCT HgB A1C -     CMP14+EGFR  Paronychia of finger, right -     doxycycline (VIBRA-TABS) 100 MG tablet; Take 1 tablet (100 mg total) by mouth 2 (two) times daily. -     mupirocin ointment (BACTROBAN) 2 %; Apply to affected area TID  for 7 days.  Hyperlipidemia LDL goal <70  Acquired hypothyroidism   A1C to goal Continue on same medications BP to goal On statin CmP ordered Eye and foot exam UTD Declined flu shot today Follow up in 3 months  Discuss infection of cuticle  Salt water soaks and bactroban trial  Going out of town if more signs of infection start doxycycline      Tandy Gaw, PA-C

## 2022-12-15 ENCOUNTER — Encounter: Payer: Self-pay | Admitting: *Deleted

## 2022-12-16 ENCOUNTER — Telehealth: Payer: Self-pay

## 2022-12-16 NOTE — Telephone Encounter (Signed)
Notice receive fro [employer] that [employer] should be cheaper than current xiguo. Patient is agreeable to the switch but states she just received a 90 day supply of the xigduo.  Wanting to know if you need to see her for a visit to discuss these changes?

## 2022-12-19 MED ORDER — SYNJARDY XR 25-1000 MG PO TB24: 1.0000 | ORAL_TABLET | Freq: Every day | ORAL | 0 refills | Status: DC

## 2022-12-19 NOTE — Telephone Encounter (Signed)
Ok will send over for next refill. Same class. Take same way.

## 2022-12-19 NOTE — Addendum Note (Signed)
Addended by: [name] on: 12/19/2022 12:54 PM   Modules accepted: Orders

## 2022-12-19 NOTE — Telephone Encounter (Signed)
Attempted call . Phone rang without answer. Could not leave a voice mail message .

## 2022-12-22 ENCOUNTER — Telehealth: Payer: Self-pay | Admitting: Physician Assistant

## 2022-12-22 NOTE — Telephone Encounter (Signed)
Pt returned your call.  

## 2022-12-22 NOTE — Telephone Encounter (Signed)
Attempted call to patient. Left voice mail message requesting a return call.  

## 2022-12-24 NOTE — Telephone Encounter (Signed)
Left detailed voice mail message for pt. ( Allowed on DPR) ==kph

## 2022-12-24 NOTE — Telephone Encounter (Signed)
Left detailed voice mail message on patient listed number in chart ( allowed on DPR)

## 2022-12-25 ENCOUNTER — Encounter: Payer: Self-pay | Admitting: Physician Assistant

## 2022-12-26 ENCOUNTER — Encounter: Payer: Self-pay | Admitting: Family Medicine

## 2022-12-26 ENCOUNTER — Ambulatory Visit (INDEPENDENT_AMBULATORY_CARE_PROVIDER_SITE_OTHER): Payer: Medicare HMO | Admitting: Family Medicine

## 2022-12-26 VITALS — BP 100/60 | HR 83 | Temp 98.5°F | Resp 18 | Ht <= 58 in | Wt 125.3 lb

## 2022-12-26 DIAGNOSIS — J4520 Mild intermittent asthma, uncomplicated: Secondary | ICD-10-CM | POA: Diagnosis not present

## 2022-12-26 DIAGNOSIS — R0789 Other chest pain: Secondary | ICD-10-CM

## 2022-12-26 DIAGNOSIS — R509 Fever, unspecified: Secondary | ICD-10-CM

## 2022-12-26 MED ORDER — PREDNISONE 10 MG PO TABS
10.0000 mg | ORAL_TABLET | Freq: Two times a day (BID) | ORAL | 0 refills | Status: DC
Start: 2022-12-26 — End: 2023-03-04

## 2022-12-26 MED ORDER — ALBUTEROL SULFATE HFA 108 (90 BASE) MCG/ACT IN AERS
1.0000 | INHALATION_SPRAY | RESPIRATORY_TRACT | 0 refills | Status: DC | PRN
Start: 2022-12-26 — End: 2023-01-19

## 2022-12-26 MED ORDER — AZITHROMYCIN 250 MG PO TABS: ORAL_TABLET | ORAL | 0 refills | Status: AC

## 2022-12-26 NOTE — Progress Notes (Signed)
Established Patient Office Visit  Subjective   Patient ID: Keon Waltermire, female    DOB: December 25, 1955  Age: 67 y.o. MRN: [id number]  No chief complaint on file.   HPI  Presents today for an acute visit with complaint of cough and tightness in chest with deep breath. History of asthma with chest tightness and cough. Symptoms have been present  4 days  Associated symptoms include: chills, unsure of fever  Pertinent negatives: shortness of breath with walking, no wheezing  Pain severity: 0/10  Treatments tried include : albuterol neb/inhaler  Treatment effective : unsure if she is getting it out or not Sick contacts : husband is ill too   Review of Systems  Respiratory:  Positive for cough. Negative for sputum production, shortness of breath and wheezing.       Objective:     BP 100/60   Pulse 83   Temp 98.5 F (36.9 C)   Resp 18   Ht 4\' 10"  (1.473 m)   Wt 125 lb 4.8 oz (56.8 kg)   SpO2 95%   BMI 26.19 kg/m  BP Readings from Last 3 Encounters:  12/26/22 100/60  12/09/22 120/60  09/08/22 (!) 115/55      Physical Exam Vitals and nursing note reviewed.  Constitutional:      Appearance: Normal appearance. She is ill-appearing.  Cardiovascular:     Rate and Rhythm: Normal rate.     Heart sounds: Normal heart sounds.  Pulmonary:     Effort: Pulmonary effort is normal.     Breath sounds: Normal breath sounds. No wheezing or rhonchi.  Skin:    General: Skin is warm and dry.  Neurological:     General: No focal deficit present.     Mental Status: She is alert. Mental status is at baseline.  Psychiatric:        Mood and Affect: Mood normal.        Behavior: Behavior normal.        Thought Content: Thought content normal.        Judgment: Judgment normal.     No results found for any visits on 12/26/22.     The 10-year ASCVD risk score (Arnett DK, et al., 2019) is: 8.7%    Assessment & Plan:   Problem List Items Addressed This Visit     Asthma,  chronic    URI symptoms with cough and chest tightness upon deep breathing. Ill appearing. Will treat with antibiotic due to asthma status.  Azithromycin 500 mg today, then 250  mg every day for next 4 days. Prednisone 10 mg BID x 3 days. Monitor blood sugars and stop taking if uncontrolled. Albuterol inhaler refilled as she isn't sure the one she has is not working.  Opted out of Covid testing today, this is day # 4 of symptoms. Vital signs stable, oxygen saturation 95%. Encouraged deep breathing  to avoid respiratory complications. Follow-up with PCP if symptoms do not improve over the weekend.      Relevant Medications   azithromycin (ZITHROMAX) 250 MG tablet   predniSONE (DELTASONE) 10 MG tablet   albuterol (VENTOLIN HFA) 108 (90 Base) MCG/ACT inhaler   Chest tightness   Relevant Medications   azithromycin (ZITHROMAX) 250 MG tablet   predniSONE (DELTASONE) 10 MG tablet   albuterol (VENTOLIN HFA) 108 (90 Base) MCG/ACT inhaler   Fever and chills - Primary  Agrees with plan of care discussed.  Questions answered.   Return if symptoms worsen or  fail to improve.    Novella Olive, FNP

## 2022-12-26 NOTE — Assessment & Plan Note (Addendum)
URI symptoms with cough and chest tightness upon deep breathing. Ill appearing. Will treat with antibiotic due to asthma status.  Azithromycin 500 mg today, then 250  mg every day for next 4 days. Prednisone 10 mg BID x 3 days. Monitor blood sugars and stop taking if uncontrolled. Albuterol inhaler refilled as she isn't sure the one she has is not working.  Opted out of Covid testing today, this is day # 4 of symptoms. Vital signs stable, oxygen saturation 95%. Encouraged deep breathing  to avoid respiratory complications. Follow-up with PCP if symptoms do not improve over the weekend.

## 2022-12-30 ENCOUNTER — Ambulatory Visit: Payer: Medicare HMO

## 2022-12-30 ENCOUNTER — Ambulatory Visit (INDEPENDENT_AMBULATORY_CARE_PROVIDER_SITE_OTHER): Payer: Medicare HMO | Admitting: Physician Assistant

## 2022-12-30 ENCOUNTER — Encounter: Payer: Self-pay | Admitting: Physician Assistant

## 2022-12-30 VITALS — BP 114/62 | HR 81 | Ht <= 58 in | Wt 122.0 lb

## 2022-12-30 DIAGNOSIS — R051 Acute cough: Secondary | ICD-10-CM | POA: Diagnosis not present

## 2022-12-30 DIAGNOSIS — J329 Chronic sinusitis, unspecified: Secondary | ICD-10-CM

## 2022-12-30 DIAGNOSIS — R0981 Nasal congestion: Secondary | ICD-10-CM | POA: Diagnosis not present

## 2022-12-30 DIAGNOSIS — J4531 Mild persistent asthma with (acute) exacerbation: Secondary | ICD-10-CM

## 2022-12-30 DIAGNOSIS — R0789 Other chest pain: Secondary | ICD-10-CM | POA: Diagnosis not present

## 2022-12-30 DIAGNOSIS — J40 Bronchitis, not specified as acute or chronic: Secondary | ICD-10-CM | POA: Diagnosis not present

## 2022-12-30 DIAGNOSIS — R059 Cough, unspecified: Secondary | ICD-10-CM | POA: Diagnosis not present

## 2022-12-30 DIAGNOSIS — R0602 Shortness of breath: Secondary | ICD-10-CM | POA: Diagnosis not present

## 2022-12-30 MED ORDER — PREDNISONE 20 MG PO TABS
ORAL_TABLET | ORAL | 0 refills | Status: DC
Start: 2022-12-30 — End: 2023-03-04

## 2022-12-30 MED ORDER — FLUTICASONE PROPIONATE 50 MCG/ACT NA SUSP
2.0000 | Freq: Every day | NASAL | 0 refills | Status: DC
Start: 2022-12-30 — End: 2023-01-21

## 2022-12-30 MED ORDER — IPRATROPIUM-ALBUTEROL 0.5-2.5 (3) MG/3ML IN SOLN
3.0000 mL | RESPIRATORY_TRACT | 0 refills | Status: DC | PRN
Start: 2022-12-30 — End: 2023-10-15

## 2022-12-30 NOTE — Progress Notes (Signed)
CXR is clear no signs of infection. Stay on prednisone and albuterol/duoneb and symptomatic care for now.

## 2022-12-31 ENCOUNTER — Encounter: Payer: Self-pay | Admitting: Physician Assistant

## 2022-12-31 DIAGNOSIS — R0981 Nasal congestion: Secondary | ICD-10-CM | POA: Insufficient documentation

## 2022-12-31 NOTE — Progress Notes (Signed)
Acute Office Visit  Subjective:     Patient ID: Leslie Roberson, female    DOB: 1955/09/14, 67 y.o.   MRN: [id number]  Chief Complaint  Patient presents with   Cough    Chest tightness    HPI Patient is in today for follow up on upper respiratory infection. Pt does have asthma and recently traveled to [place]. Her symptoms actually started there. When she returned she went to UC and given zpak, prednisone and albuterol on 12/26/2022. She is feeling some better but still has a tight cough and chest tightness. She also has some sinus congestion. She is not coughing up or blowing out anything yellow or green. No fever, chills body aches. She does feel weak and tired.    .. Active Ambulatory Problems    Diagnosis Date Noted   GERD (gastroesophageal reflux disease) 11/27/2015   Osteoporosis 11/27/2015   GAD (generalized anxiety disorder) 11/27/2015   MDD (major depressive disorder), recurrent, in full remission (HCC) 11/27/2015   Chronic dryness of both eyes 11/27/2015   Acquired hypothyroidism 11/27/2015   Asthma, chronic 11/27/2015   Insomnia 11/27/2015   Seborrheic keratoses 11/27/2015   Diverticulitis of colon 12/21/2015   Hip bursitis 12/25/2015   Biceps tendonitis on right 12/25/2015   Idiopathic scoliosis 01/22/2016   Vaginal atrophy 01/31/2016   Rosacea 02/04/2016   Right knee pain 02/19/2016   Paresthesia 04/03/2016   Facet hypertrophy of lumbosacral region 04/03/2016   Chronic back pain 04/03/2016   Bad taste in mouth 07/26/2016   No energy 09/15/2016   Lump in throat 01/27/2017   Dysphagia 01/27/2017   Chronic tension-type headache, intractable 02/05/2017   Prediabetes 02/05/2017   Right leg pain 02/06/2017   Chest tightness 05/24/2017   Itchy scalp 05/24/2017   Pneumothorax 06/25/2017   Angina, class III (HCC) 07/07/2017   CAD S/P percutaneous coronary angioplasty    Tongue mass 08/14/2017   Excessive sweating 09/22/2017   Rhinorrhea 01/04/2018    Arthralgia 02/05/2018   Nasal dryness 02/06/2018   Hyperlipidemia LDL goal <70 11/29/2018   DJD (degenerative joint disease), lumbosacral 11/29/2018   Pre-operative clearance 11/29/2018   Diabetes mellitus without complication (HCC) 02/07/2019   Migraine without aura and without status migrainosus, not intractable 02/08/2019   OAB (overactive bladder) 02/08/2019   Snoring 05/02/2019   Non-restorative sleep 05/02/2019   OSA on CPAP 05/20/2019   Ingrown right greater toenail 07/19/2019   Status post hip replacement, left 07/19/2019   Left hip pain 07/19/2019   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   Acute conjunctivitis of both eyes 01/23/2020   CAD in native artery 11/27/2015   S/P cardiac cath 07/07/2017   Paroxysmal nocturnal dyspnea 05/14/2020   Orthopnea 05/14/2020   Cough 05/18/2020   Controlled type 2 diabetes mellitus with complication, without long-term current use of insulin (HCC) 11/16/2020   Right hand pain 11/16/2020   Fall 11/16/2020   Bilateral hearing loss 04/05/2021   Impacted cerumen, left ear 04/05/2021   Ear fullness, left 04/08/2021   Mass of right axilla 05/15/2021   Closed dislocation of metacarpophalangeal joint of right thumb 11/18/2021   Community acquired pneumonia of left lower lobe of lung 12/11/2021   Sinobronchitis 07/29/2022   Need for antibiotic prophylaxis for dental procedure 08/05/2022   Routine health maintenance 09/08/2022   Fever and chills 12/26/2022   Nasal congestion 12/31/2022   Resolved Ambulatory Problems    Diagnosis Date Noted   Acute medial meniscus tear 04/23/2016  Left knee pain 04/28/2016   Mild intermittent asthma without complication 08/18/2016   Past Medical History:  Diagnosis Date   Allergy 1992   Anxiety    Arthritis    Cataract 2021   Coronary artery disease    Depression    History of blood transfusion    Hypertension    Hypothyroidism    Pneumonia 2016   Pre-diabetes    Sleep apnea 2020    Thyroid disease      ROS See HPI.      Objective:    BP 114/62   Pulse 81   Ht 4\' 10"  (1.473 m)   Wt 122 lb (55.3 kg)   SpO2 93%   BMI 25.50 kg/m  BP Readings from Last 3 Encounters:  12/30/22 114/62  12/26/22 100/60  12/09/22 120/60   Wt Readings from Last 3 Encounters:  12/30/22 122 lb (55.3 kg)  12/26/22 125 lb 4.8 oz (56.8 kg)  12/09/22 125 lb (56.7 kg)      Physical Exam Constitutional:      Appearance: Normal appearance.  HENT:     Head: Normocephalic.     Right Ear: Tympanic membrane, ear canal and external ear normal. There is no impacted cerumen.     Left Ear: Tympanic membrane, ear canal and external ear normal. There is no impacted cerumen.     Nose: Congestion present.     Mouth/Throat:     Mouth: Mucous membranes are moist.     Pharynx: Posterior oropharyngeal erythema present.  Eyes:     Extraocular Movements: Extraocular movements intact.     Conjunctiva/sclera: Conjunctivae normal.     Pupils: Pupils are equal, round, and reactive to light.  Neck:     Vascular: No carotid bruit.  Cardiovascular:     Rate and Rhythm: Normal rate and regular rhythm.  Pulmonary:     Effort: Pulmonary effort is normal.     Breath sounds: Normal breath sounds.  Musculoskeletal:        General: No swelling, tenderness, deformity or signs of injury.     Cervical back: Normal range of motion and neck supple. No rigidity or tenderness.     Right lower leg: No edema.     Left lower leg: No edema.  Lymphadenopathy:     Cervical: No cervical adenopathy.  Neurological:     General: No focal deficit present.     Mental Status: She is alert and oriented to person, place, and time.  Psychiatric:        Mood and Affect: Mood normal.          Assessment & Plan:  Marland KitchenMarland KitchenMary "Jaielle Sauber" was seen today for cough.  Diagnoses and all orders for this visit:  Mild persistent asthma with exacerbation -     DG Chest 2 View; Future -     predniSONE (DELTASONE) 20 MG tablet;  Take one tablet twice a day for 3 days then one tablet daily for 3 days then 1/2 tablet for 4 days. -     ipratropium-albuterol (DUONEB) 0.5-2.5 (3) MG/3ML SOLN; Take 3 mLs by nebulization every 2 (two) hours as needed (wheeze, SOB). -     fluticasone (FLONASE) 50 MCG/ACT nasal spray; Place 2 sprays into both nostrils daily.  Nasal congestion -     predniSONE (DELTASONE) 20 MG tablet; Take one tablet twice a day for 3 days then one tablet daily for 3 days then 1/2 tablet for 4 days.  Chest tightness -  predniSONE (DELTASONE) 20 MG tablet; Take one tablet twice a day for 3 days then one tablet daily for 3 days then 1/2 tablet for 4 days.  Acute cough -     DG Chest 2 View; Future -     predniSONE (DELTASONE) 20 MG tablet; Take one tablet twice a day for 3 days then one tablet daily for 3 days then 1/2 tablet for 4 days. -     fluticasone (FLONASE) 50 MCG/ACT nasal spray; Place 2 sprays into both nostrils daily.   Pts pulse ox was 93 percent and did go up to 96 with deep breathing CXR to confirm no infection Stronger prednisone taper Flonase for nasal congestion  Duoneb to use in home nebulizer every 4-6 hours as needed for chest tightness and SOB Continue to use OTC cough syrup and rest and hydrate Pt has traveled in airplane recently with no calf tenderness/swelling/pain Normal HR but pulse ox decreased If not improving please follow up and certainly if worsening  Tandy Gaw, PA-C

## 2023-01-07 ENCOUNTER — Ambulatory Visit: Payer: Medicare HMO | Admitting: Physician Assistant

## 2023-01-07 VITALS — BP 115/60 | HR 71 | Ht <= 58 in | Wt 122.0 lb

## 2023-01-07 DIAGNOSIS — M810 Age-related osteoporosis without current pathological fracture: Secondary | ICD-10-CM

## 2023-01-07 MED ORDER — DENOSUMAB 60 MG/ML ~~LOC~~ SOSY
60.0000 mg | PREFILLED_SYRINGE | Freq: Once | SUBCUTANEOUS | Status: AC
Start: 2023-07-08 — End: 2023-09-02
  Administered 2023-09-02: 60 mg via SUBCUTANEOUS

## 2023-01-07 MED ORDER — DENOSUMAB 60 MG/ML ~~LOC~~ SOSY
60.0000 mg | PREFILLED_SYRINGE | Freq: Once | SUBCUTANEOUS | Status: AC
Start: 2023-01-07 — End: 2023-01-07
  Administered 2023-01-07: 60 mg via SUBCUTANEOUS

## 2023-01-07 NOTE — Progress Notes (Signed)
Pt is here for her prolia injection. Her calcium and kidney function are both WNL. She currently is taking calcium and vitamin D supplements.    Prolia given in Left arm she tolerated this well. She was advised to RTC in 6 months for her next injection 07/08/2023.  Order for prolia placed.

## 2023-01-08 NOTE — Progress Notes (Signed)
Agree with above plan. 

## 2023-01-11 ENCOUNTER — Encounter: Payer: Self-pay | Admitting: Physician Assistant

## 2023-01-11 DIAGNOSIS — K2100 Gastro-esophageal reflux disease with esophagitis, without bleeding: Secondary | ICD-10-CM

## 2023-01-12 MED ORDER — PANTOPRAZOLE SODIUM 40 MG PO TBEC
40.0000 mg | DELAYED_RELEASE_TABLET | Freq: Two times a day (BID) | ORAL | 3 refills | Status: DC
Start: 2023-01-12 — End: 2023-01-19

## 2023-01-14 DIAGNOSIS — M461 Sacroiliitis, not elsewhere classified: Secondary | ICD-10-CM | POA: Diagnosis not present

## 2023-01-14 DIAGNOSIS — M5416 Radiculopathy, lumbar region: Secondary | ICD-10-CM | POA: Diagnosis not present

## 2023-01-17 ENCOUNTER — Other Ambulatory Visit: Payer: Self-pay | Admitting: Family Medicine

## 2023-01-17 DIAGNOSIS — R0789 Other chest pain: Secondary | ICD-10-CM

## 2023-01-17 DIAGNOSIS — J4520 Mild intermittent asthma, uncomplicated: Secondary | ICD-10-CM

## 2023-01-19 ENCOUNTER — Other Ambulatory Visit: Payer: Self-pay | Admitting: Physician Assistant

## 2023-01-19 DIAGNOSIS — K2100 Gastro-esophageal reflux disease with esophagitis, without bleeding: Secondary | ICD-10-CM

## 2023-01-21 ENCOUNTER — Other Ambulatory Visit: Payer: Self-pay | Admitting: Physician Assistant

## 2023-01-21 DIAGNOSIS — J4531 Mild persistent asthma with (acute) exacerbation: Secondary | ICD-10-CM

## 2023-01-21 DIAGNOSIS — R051 Acute cough: Secondary | ICD-10-CM

## 2023-01-28 ENCOUNTER — Telehealth: Payer: Self-pay | Admitting: Physician Assistant

## 2023-01-28 MED ORDER — SYNJARDY XR 25-1000 MG PO TB24: 1.0000 | ORAL_TABLET | Freq: Every day | ORAL | 3 refills | Status: DC

## 2023-01-28 NOTE — Telephone Encounter (Signed)
sent 

## 2023-01-28 NOTE — Telephone Encounter (Signed)
[facility] called to request refills on Synjardy XL 25-1000mg ,  Please submit to [facility] Mail [facility] Phone: [phone]

## 2023-02-03 ENCOUNTER — Encounter: Payer: Self-pay | Admitting: Physician Assistant

## 2023-02-19 ENCOUNTER — Encounter: Payer: Self-pay | Admitting: Cardiology

## 2023-02-20 NOTE — Telephone Encounter (Signed)
Forwarding to provider for recommendations

## 2023-03-02 ENCOUNTER — Other Ambulatory Visit (INDEPENDENT_AMBULATORY_CARE_PROVIDER_SITE_OTHER): Payer: Self-pay

## 2023-03-02 ENCOUNTER — Ambulatory Visit (INDEPENDENT_AMBULATORY_CARE_PROVIDER_SITE_OTHER): Payer: Medicare HMO | Admitting: Orthopaedic Surgery

## 2023-03-02 ENCOUNTER — Encounter: Payer: Self-pay | Admitting: Orthopaedic Surgery

## 2023-03-02 DIAGNOSIS — Z96642 Presence of left artificial hip joint: Secondary | ICD-10-CM

## 2023-03-02 DIAGNOSIS — M25551 Pain in right hip: Secondary | ICD-10-CM

## 2023-03-02 NOTE — Progress Notes (Signed)
The patient is a 67 year old female who comes in with right hip pain that occurs around the lateral aspect of her hip whether she is standing or walking.  She has a complicated history as it relates to her pelvis and her left hip.  She has a right sided SI joint fusion.  She had left posterior hip surgery back in the late 1990s and eventually had a revision of that left hip due to polyethylene liner wear with just an exchange of the polyliner hip ball back in 2009.  She denies any hip pain at all on the left side.  Under 3 months ago she did have a steroid injection under ultrasound around the trochanteric area of her right hip.  That is helped somewhat but she has had multiple injections over a long period of time with the right hip and it does become frustrating for her.  She also has a lumbar scoliosis and this throws off her gait.  There is a leg length difference as well.  I was able to review her medications and past medical history within epic.  Examination with her laying supine show she is just slightly shorter on the left side than the right.  Both hips move smoothly and fluidly with no blocks to rotation.  Her right hip has pain only over the trochanteric area of that hip.  An AP pelvis and lateral both hips shows some polyethylene liner wear on the left hip.  There is no osteolysis of the bone but the femoral head is in the slightly superior position showing some polyliner wear.  The metal components appear well-maintained.  The right hip has a well-maintained joint space.  There is a previous right SI joint fusion.  I would like to send her for a MRI of her right hip at this point to assess the gluteus medius and minimus tendons as well as the cartilage of her right hip.  She does deny groin pain and most of her pain seems to be around the trochanteric area.  At some point she will likely need a poly liner and head ball revision on the left side.  We will see what we can do to find component  manufacture/component types and they have a letter that states what the types are which they can bring to [place] as well.  Apparently it is in the chart somewhere which we will have to look and see.  We will see her back in follow-up once we have the MRI of the right hip.  She agrees with this treatment plan.  All questions and concerns were addressed and answered.

## 2023-03-03 ENCOUNTER — Other Ambulatory Visit: Payer: Self-pay | Admitting: Physician Assistant

## 2023-03-03 ENCOUNTER — Other Ambulatory Visit: Payer: Self-pay | Admitting: Radiology

## 2023-03-03 DIAGNOSIS — M25551 Pain in right hip: Secondary | ICD-10-CM

## 2023-03-03 DIAGNOSIS — Z1231 Encounter for screening mammogram for malignant neoplasm of breast: Secondary | ICD-10-CM

## 2023-03-04 ENCOUNTER — Encounter: Payer: Self-pay | Admitting: Physician Assistant

## 2023-03-04 ENCOUNTER — Ambulatory Visit (INDEPENDENT_AMBULATORY_CARE_PROVIDER_SITE_OTHER): Payer: Medicare HMO | Admitting: Physician Assistant

## 2023-03-04 VITALS — BP 105/80 | HR 60 | Ht <= 58 in | Wt 124.0 lb

## 2023-03-04 DIAGNOSIS — Z79899 Other long term (current) drug therapy: Secondary | ICD-10-CM

## 2023-03-04 DIAGNOSIS — E039 Hypothyroidism, unspecified: Secondary | ICD-10-CM | POA: Diagnosis not present

## 2023-03-04 DIAGNOSIS — E118 Type 2 diabetes mellitus with unspecified complications: Secondary | ICD-10-CM

## 2023-03-04 LAB — POCT UA - MICROALBUMIN
Albumin/Creatinine Ratio, Urine, POC: <30
Creatinine, POC: 300 mg/dL
Microalbumin Ur, POC: 10 mg/L

## 2023-03-04 NOTE — Progress Notes (Signed)
No concerns with protein in urine!

## 2023-03-04 NOTE — Progress Notes (Signed)
Established Patient Office Visit  Subjective   Patient ID: Leslie Roberson, female    DOB: [birth year]  Age: 67 y.o. MRN: [id number]  Chief Complaint  Patient presents with   Medical Management of Chronic Issues    3 mo fup d/m last A1c 6.0 12/09/22 , pt will lave labs draw     HPI 67 yo female presents today for yearly UA for diabetes. She does not take at home blood glucoses. She does not feel like her blood glucose runs low. She is on [place]. Last A1c on 12/09/22 was 6.0.   She has been having right hip pain and has been seeing ortho. She is getting a steroid injection next week.  .. Active Ambulatory Problems    Diagnosis Date Noted   GERD (gastroesophageal reflux disease) 11/27/2015   Osteoporosis 11/27/2015   GAD (generalized anxiety disorder) 11/27/2015   MDD (major depressive disorder), recurrent, in full remission (HCC) 11/27/2015   Chronic dryness of both eyes 11/27/2015   Acquired hypothyroidism 11/27/2015   Asthma, chronic 11/27/2015   Insomnia 11/27/2015   Seborrheic keratoses 11/27/2015   Diverticulitis of colon 12/21/2015   Hip bursitis 12/25/2015   Biceps tendonitis on right 12/25/2015   Idiopathic scoliosis 01/22/2016   Vaginal atrophy 01/31/2016   Rosacea 02/04/2016   Right knee pain 02/19/2016   Paresthesia 04/03/2016   Facet hypertrophy of lumbosacral region 04/03/2016   Chronic back pain 04/03/2016   Bad taste in mouth 07/26/2016   No energy 09/15/2016   Lump in throat 01/27/2017   Dysphagia 01/27/2017   Chronic tension-type headache, intractable 02/05/2017   Prediabetes 02/05/2017   Right leg pain 02/06/2017   Chest tightness 05/24/2017   Itchy scalp 05/24/2017   Pneumothorax 06/25/2017   Angina, class III (HCC) 07/07/2017   CAD S/P percutaneous coronary angioplasty    Tongue mass 08/14/2017   Excessive sweating 09/22/2017   Rhinorrhea 01/04/2018   Arthralgia 02/05/2018   Nasal dryness 02/06/2018   Hyperlipidemia LDL goal <70 11/29/2018    DJD (degenerative joint disease), lumbosacral 11/29/2018   Pre-operative clearance 11/29/2018   Diabetes mellitus without complication (HCC) 02/07/2019   Migraine without aura and without status migrainosus, not intractable 02/08/2019   OAB (overactive bladder) 02/08/2019   Snoring 05/02/2019   Non-restorative sleep 05/02/2019   OSA on CPAP 05/20/2019   Ingrown right greater toenail 07/19/2019   Status post hip replacement, left 07/19/2019   Left hip pain 07/19/2019   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   Acute conjunctivitis of both eyes 01/23/2020   CAD in native artery 11/27/2015   S/P cardiac cath 07/07/2017   Paroxysmal nocturnal dyspnea 05/14/2020   Orthopnea 05/14/2020   Cough 05/18/2020   Controlled type 2 diabetes mellitus with complication, without long-term current use of insulin (HCC) 11/16/2020   Right hand pain 11/16/2020   Fall 11/16/2020   Bilateral hearing loss 04/05/2021   Impacted cerumen, left ear 04/05/2021   Ear fullness, left 04/08/2021   Mass of right axilla 05/15/2021   Closed dislocation of metacarpophalangeal joint of right thumb 11/18/2021   Community acquired pneumonia of left lower lobe of lung 12/11/2021   Sinobronchitis 07/29/2022   Need for antibiotic prophylaxis for dental procedure 08/05/2022   Routine health maintenance 09/08/2022   Fever and chills 12/26/2022   Nasal congestion 12/31/2022   Resolved Ambulatory Problems    Diagnosis Date Noted   Acute medial meniscus tear 04/23/2016   Left knee pain 04/28/2016   Mild intermittent asthma  without complication 08/18/2016   Past Medical History:  Diagnosis Date   Allergy 1992   Anxiety    Arthritis    Cataract 2021   Coronary artery disease    Depression    History of blood transfusion    Hypertension    Hypothyroidism    Pneumonia 2016   Pre-diabetes    Sleep apnea 2020   Thyroid disease      Review of Systems  Respiratory:  Negative for shortness of breath.    Cardiovascular:  Negative for chest pain.      Objective:     BP 105/80   Pulse 60   Ht 4\' 10"  (1.473 m)   Wt 124 lb (56.2 kg)   SpO2 95%   BMI 25.92 kg/m    Physical Exam Constitutional:      Appearance: Normal appearance.  HENT:     Head: Normocephalic.  Cardiovascular:     Rate and Rhythm: Normal rate and regular rhythm.     Heart sounds: Normal heart sounds.  Pulmonary:     Effort: Pulmonary effort is normal.     Breath sounds: Normal breath sounds.  Neurological:     General: No focal deficit present.     Mental Status: She is alert and oriented to person, place, and time.  Psychiatric:        Mood and Affect: Mood normal.    .. Diabetic Foot Exam - Simple   Simple Foot Form Diabetic Foot exam was performed with the following findings: Yes 03/04/2023  2:19 PM  Visual Inspection No deformities, no ulcerations, no other skin breakdown bilaterally: Yes Sensation Testing Intact to touch and monofilament testing bilaterally: Yes Pulse Check Posterior Tibialis and Dorsalis pulse intact bilaterally: Yes Comments     Results for orders placed or performed in visit on 03/04/23  POCT UA - Microalbumin  Result Value Ref Range   Microalbumin Ur, POC 10 mg/L   Creatinine, POC 300 mg/dL   Albumin/Creatinine Ratio, Urine, POC <30      The 10-year ASCVD risk score (Arnett DK, et al., 2019) is: 9.5%    Assessment & Plan:  Marland KitchenMarland KitchenMary "Trechelle Beacher" was seen today for medical management of chronic issues.  Diagnoses and all orders for this visit:  Controlled type 2 diabetes mellitus with complication, without long-term current use of insulin (HCC) -     Hemoglobin A1c -     CMP14+EGFR -     TSH + free T4 -     POCT UA - Microalbumin  Acquired hypothyroidism -     TSH + free T4  Medication management -     Hemoglobin A1c -     CMP14+EGFR -     TSH + free T4   Urine microalbumin obtained today- [facility]. Continue on same medications. BP to goal. On  statin.  A1c, CMP, and TSH ordered- told patient to come back in 1 wk when her A1c is due to get these. Discussed that steroid injections may increase her blood glucose.  Diabetic foot exam performed in office today. Eye exam UTD. Patient received flu and COVID booster vaccines in September.  Follow up in 3 months.  Return in about 3 months (around 06/02/2023).    Tandy Gaw, PA-C

## 2023-03-06 ENCOUNTER — Encounter: Payer: Self-pay | Admitting: Physician Assistant

## 2023-03-10 DIAGNOSIS — E039 Hypothyroidism, unspecified: Secondary | ICD-10-CM | POA: Diagnosis not present

## 2023-03-10 DIAGNOSIS — Z79899 Other long term (current) drug therapy: Secondary | ICD-10-CM | POA: Diagnosis not present

## 2023-03-10 DIAGNOSIS — E118 Type 2 diabetes mellitus with unspecified complications: Secondary | ICD-10-CM | POA: Diagnosis not present

## 2023-03-11 DIAGNOSIS — M7061 Trochanteric bursitis, right hip: Secondary | ICD-10-CM | POA: Diagnosis not present

## 2023-03-11 LAB — CMP14+EGFR
ALT: 20 [IU]/L (ref 0–32)
AST: 22 [IU]/L (ref 0–40)
Albumin: 4.1 g/dL (ref 3.9–4.9)
Alkaline Phosphatase: 68 [IU]/L (ref 44–121)
BUN/Creatinine Ratio: 18 (ref 12–28)
BUN: 17 mg/dL (ref 8–27)
Bilirubin Total: 0.3 mg/dL (ref 0.0–1.2)
CO2: 25 mmol/L (ref 20–29)
Calcium: 9.8 mg/dL (ref 8.7–10.3)
Chloride: 100 mmol/L (ref 96–106)
Creatinine, Ser: 0.93 mg/dL (ref 0.57–1.00)
Globulin, Total: 2.5 g/dL (ref 1.5–4.5)
Glucose: 192 mg/dL — ABNORMAL HIGH (ref 70–99)
Potassium: 4.2 mmol/L (ref 3.5–5.2)
Sodium: 141 mmol/L (ref 134–144)
Total Protein: 6.6 g/dL (ref 6.0–8.5)
eGFR: 67 mL/min/{1.73_m2} (ref >59)

## 2023-03-11 LAB — HEMOGLOBIN A1C
Est. average glucose Bld gHb Est-mCnc: 151 mg/dL
Hgb A1c MFr Bld: 6.9 % — ABNORMAL HIGH (ref 4.8–5.6)

## 2023-03-11 LAB — TSH+FREE T4
Free T4: 1.65 ng/dL (ref 0.82–1.77)
TSH: 1.7 u[IU]/mL (ref 0.450–4.500)

## 2023-03-11 NOTE — Progress Notes (Signed)
A1C increased quite a bit Leslie Roberson. Really watch the sugars and carbs in your diet. Recheck in 3 months and may need to change meds if continues to go up.   Thyroid looks great.   Kidney and liver look good.

## 2023-04-03 ENCOUNTER — Ambulatory Visit
Admission: RE | Admit: 2023-04-03 | Discharge: 2023-04-03 | Disposition: A | Discharge status: Home / Self Care | Payer: PPO | Source: Ambulatory Visit | Attending: Orthopaedic Surgery | Admitting: Orthopaedic Surgery

## 2023-04-03 DIAGNOSIS — G8929 Other chronic pain: Secondary | ICD-10-CM | POA: Diagnosis not present

## 2023-04-03 DIAGNOSIS — M25551 Pain in right hip: Secondary | ICD-10-CM | POA: Diagnosis not present

## 2023-04-07 DIAGNOSIS — M7061 Trochanteric bursitis, right hip: Secondary | ICD-10-CM | POA: Diagnosis not present

## 2023-04-09 ENCOUNTER — Ambulatory Visit (INDEPENDENT_AMBULATORY_CARE_PROVIDER_SITE_OTHER): Payer: PPO | Admitting: Orthopaedic Surgery

## 2023-04-09 ENCOUNTER — Encounter: Payer: Self-pay | Admitting: Orthopaedic Surgery

## 2023-04-09 DIAGNOSIS — M25551 Pain in right hip: Secondary | ICD-10-CM | POA: Diagnosis not present

## 2023-04-09 NOTE — Progress Notes (Signed)
 The patient is a pleasant 68 year old female who comes in today for follow-up after having a MRI of her right hip.  She has remote history of a left total hip arthroplasty and a left hip revision.  That was done elsewhere.  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  She is still asymptomatic on that left side and understands we need to probably x-ray that hip again in about a year to see if she is getting close to needing a polyliner exchange.  However does her right hip the bothers her the most and has been on the lateral aspect of that hip.  She has had multiple injections over the years in the right hip.  She does have a right SI joint fusion I believe.  On exam her right hip still moves smoothly and fluidly with no blocks to rotation and no pain in the groin at all.  She is very tender to palpation all along the tip of the greater trochanter and the lateral aspect of her proximal femur.  The MRI of her right hip shows only mild arthritic changes in the hip.  There is no edema in the femoral head or acetabulum.  There is likely some degenerative labral wear but I did not see any evidence of tearing of the gluteus medius or minimus tendons.  I did share with her the MRI findings.  I would like to send her to my partner Dr.Bokshan for his assessment evaluation of the lateral aspect of her right hip to see if there is any type of interventions that he could recommend based on his assessment of her hip.  She agrees with this treatment plan.  We will work on getting her appointment with him in the near future.

## 2023-04-13 DIAGNOSIS — F333 Major depressive disorder, recurrent, severe with psychotic symptoms: Secondary | ICD-10-CM | POA: Diagnosis not present

## 2023-04-24 ENCOUNTER — Ambulatory Visit (HOSPITAL_BASED_OUTPATIENT_CLINIC_OR_DEPARTMENT_OTHER): Payer: PPO | Admitting: Orthopaedic Surgery

## 2023-04-24 DIAGNOSIS — M25551 Pain in right hip: Secondary | ICD-10-CM | POA: Diagnosis not present

## 2023-04-24 NOTE — Progress Notes (Signed)
Chief Complaint: Right hip pain     History of Present Illness:    Leslie Roberson is a 68 y.o. female presents today with ongoing right hip pain for the last several years.  She has previously had an SI joint fusion which she does state gave her significant relief initially but now she is back to having pain about the lateral aspect of the hip.  Of note she is status post left total hip arthroplasty done while she was in her 84s.  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  She has had multiple injections into the hip which appear to sound lateral in nature.  The first 1 that was done gave her quite significant relief.  She has had physical therapy of the right hip without any improvement.  Overall she is here today as a referral from Dr. Magnus Ivan for ongoing discussion of her hip.    PMH/PSH/Family History/Social History/Meds/Allergies:    Past Medical History:  Diagnosis Date   Allergy 1992   Anxiety    Arthritis    "right little finger; base of thumb left hand; spine" (07/07/2017)   Asthma, chronic 11/27/2015   Cataract 2021   Chronic back pain    Coronary artery disease    4/19 PCI/DEx2 to LAD, and PCI/DES x1 to RCA, normal EF   Depression    Diabetes mellitus without complication (HCC) 2020   Diverticulitis of colon 12/21/2015   Colonoscopy every 5 years/digestive health.    GERD (gastroesophageal reflux disease) 11/27/2015   History of blood transfusion    "related to spinal fusions"   Hyperlipidemia LDL goal <70 11/29/2018   Hypertension    Hypothyroidism    Idiopathic scoliosis 01/22/2016   Osteoporosis 11/27/2015   On prolia.    Pneumonia 2016   Pneumothorax 06/26/2017   Pre-diabetes    Sleep apnea 2020   Thyroid disease    Past Surgical History:  Procedure Laterality Date   ABDOMINAL HYSTERECTOMY     bladder mesh     S/P bladder sling"   COLON SURGERY  2007   CORONARY PRESSURE/FFR STUDY N/A 07/07/2017   Procedure: INTRAVASCULAR PRESSURE  WIRE/FFR STUDY;  Surgeon: Marykay Lex, MD;  Location: [facility] INVASIVE CV LAB;  Service: Cardiovascular;  Laterality: N/A;   CORONARY STENT INTERVENTION N/A 07/07/2017   Procedure: CORONARY STENT INTERVENTION;  Surgeon: Marykay Lex, MD;  Location: [facility] INVASIVE CV LAB;  Service: Cardiovascular;  Laterality: N/A;   ELBOW SURGERY Right    "dislocation"   INCONTINENCE SURGERY     "didn't work"   JOINT REPLACEMENT     LEFT HEART CATH AND CORONARY ANGIOGRAPHY N/A 07/07/2017   Procedure: LEFT HEART CATH AND CORONARY ANGIOGRAPHY;  Surgeon: Marykay Lex, MD;  Location: [facility] INVASIVE CV LAB;  Service: Cardiovascular;  Laterality: N/A;   SMALL INTESTINE SURGERY     SPINAL FUSION  ~ 1970 X 3   "below neck - lower back"   TONSILLECTOMY     TOTAL HIP ARTHROPLASTY Left    Social History   Socioeconomic History   Marital status: Married    Spouse name: Leslie Roberson   Number of children: 1   Years of education: 16   Highest education level: Bachelor's degree (e.g., BA, AB, BS)  Occupational History   Occupation: Retired  Tobacco Use   Smoking status: Never   Smokeless tobacco: Never  Vaping Use   Vaping status: Never Used  Substance and Sexual Activity   Alcohol  use: Not Currently    Comment: Maybe 3 drinks/year   Drug use: No   Sexual activity: Not Currently    Birth control/protection: None  Other Topics Concern   Not on file  Social History Narrative   Lives with her husband. She has one child. She enjoys playing games on the computer.   Social Drivers of Corporate investment banker Strain: Low Risk  (03/03/2023)   Overall Financial Resource Strain (CARDIA)    Difficulty of Paying Living Expenses: Not hard at all  Food Insecurity: No Food Insecurity (03/03/2023)   Hunger Vital Sign    Worried About Running Out of Food in the Last Year: Never true    Ran Out of Food in the Last Year: Never true  Transportation Needs: No Transportation Needs (03/03/2023)   PRAPARE - Therapist, art (Medical): No    Lack of Transportation (Non-Medical): No  Physical Activity: Inactive (03/03/2023)   Exercise Vital Sign    Days of Exercise per Week: 0 days    Minutes of Exercise per Session: 0 min  Stress: No Stress Concern Present (03/03/2023)   [employer] of Occupational Health - Occupational Stress Questionnaire    Feeling of Stress : Only a little  Social Connections: Moderately Integrated (03/03/2023)   Social Connection and Isolation Panel [NHANES]    Frequency of Communication with Friends and Family: Once a week    Frequency of Social Gatherings with Friends and Family: Once a week    Attends Religious Services: More than 4 times per year    Active Member of [employer] or Organizations: Yes    Attends Banker Meetings: Never    Marital Status: Married   Family History  Problem Relation Age of Onset   Cancer Mother        lung   Depression Mother    Early death Mother    Heart attack Father    Hyperlipidemia Father    Hypertension Father    Arthritis Father    Heart disease Father    Diabetes Maternal Aunt    Hyperlipidemia Paternal Aunt    COPD Paternal Aunt    Hypertension Paternal Aunt    Stroke Paternal Aunt    Allergies  Allergen Reactions   Hydromorphone Rash   Levofloxacin Other (See Comments)    Hallucinations   Meperidine Nausea And Vomiting and Other (See Comments)    Also "doesn't work well for my pain"   Broccoli [Brassica Oleracea] Nausea Only and Other (See Comments)    Causes stomach pain also   Adhesive [Tape] Rash   Current Outpatient Medications  Medication Sig Dispense Refill   albuterol (VENTOLIN HFA) 108 (90 Base) MCG/ACT inhaler INHALE 1-2 PUFFS INTO THE LUNGS EVERY 4 HOURS AS NEEDED FOR WHEEZING OR SHORTNESS OF BREATH. 18 g 2   aspirin EC 81 MG tablet Take 81 mg by mouth at bedtime.     atorvastatin (LIPITOR) 20 MG tablet Take 1 tablet (20 mg total) by mouth daily. 90 tablet 2   busPIRone (BUSPAR)  15 MG tablet Take 60 mg by mouth daily.     calcium-vitamin D (OSCAL WITH D) 500-200 MG-UNIT tablet Take 1 tablet by mouth daily.     clonazePAM (KLONOPIN) 0.5 MG tablet Take 0.25 mg by mouth at bedtime.      denosumab (PROLIA) 60 MG/ML SOLN injection Inject 60 mg into the skin every 6 (six) months. Administer in upper arm, thigh, or abdomen  desvenlafaxine (PRISTIQ) 100 MG 24 hr tablet Take 100 mg by mouth in the morning.     Empagliflozin-metFORMIN HCl ER (SYNJARDY XR) 25-1000 MG TB24 Take 1 tablet by mouth daily. 90 tablet 3   eszopiclone (LUNESTA) 2 MG TABS tablet Take 2 mg by mouth at bedtime.     fluticasone (FLONASE) 50 MCG/ACT nasal spray SPRAY 2 SPRAYS INTO EACH NOSTRIL EVERY DAY 48 mL 1   gabapentin (NEURONTIN) 300 MG capsule TAKE 1 CAPSULE IN THE MORNING AND 2 CAPSULES AT BEDTIME 270 capsule 3   ipratropium-albuterol (DUONEB) 0.5-2.5 (3) MG/3ML SOLN Take 3 mLs by nebulization every 2 (two) hours as needed (wheeze, SOB). 60 mL 0   ketoconazole (NIZORAL) 2 % shampoo Apply topically.     levothyroxine (SYNTHROID) 137 MCG tablet TAKE 1 TABLET ONCE A WEEK THEN 1/2 TABLET ALL OTHER DAYS AS DIRECTED 52 tablet 3   Menthol, Topical Analgesic, (MINERAL ICE EX) Apply 1 application to affected sites one to two times a day as needed (for pain)     metoprolol succinate (TOPROL-XL) 25 MG 24 hr tablet TAKE 1 TABLET (25 MG TOTAL) BY MOUTH DAILY. 90 tablet 2   nitroGLYCERIN (NITROSTAT) 0.4 MG SL tablet DISSOLVE 1 TAB UNDER THE TONGUE FOR CHEST PAIN EVERY 5 MINUTES UP TO 3 DOSES IF PAIN PERSIST CALL 911/SEEK MEDICAL HELP 75 tablet 2   Omega-3 Fatty Acids (FISH OIL PO) Take 1 capsule by mouth daily.      oxybutynin (DITROPAN-XL) 5 MG 24 hr tablet TAKE 1 TABLET EVERY DAY 90 tablet 3   pantoprazole (PROTONIX) 40 MG tablet TAKE 1 TABLET EVERY DAY 90 tablet 3   REXULTI 1 MG TABS Take 1 tablet by mouth daily.      tolterodine (DETROL LA) 2 MG 24 hr capsule Take 2 mg by mouth in the morning.     trazodone  (DESYREL) 300 MG tablet Take 300 mg by mouth at bedtime.     Current Facility-Administered Medications  Medication Dose Route Frequency Provider Last Rate Last Admin   [START ON 07/08/2023] denosumab (PROLIA) injection 60 mg  60 mg Subcutaneous Once Breeback, Jade L, PA-C       No results found.  Review of Systems:   A ROS was performed including pertinent positives and negatives as documented in the HPI.  Physical Exam :   Constitutional: NAD and appears stated age Neurological: Alert and oriented Psych: Appropriate affect and cooperative There were no vitals taken for this visit.   Comprehensive Musculoskeletal Exam:    Right hip with tenderness in the FADIR position.  She has 30 degrees internal/external rotation without any clicking or popping but pain with any internal rotation.  She does have no pain with resisted abduction of the right hip.  There is pain with direct palpation of the trochanteric.   Imaging:   Xray (3 views right hip): There is significant acetabular over coverage consistent with pincer lesion  MRI (right hip): Pincer lesion and ossification of the labrum with well-preserved femoral acetabular joint.  No obvious gluteus tearing   I personally reviewed and interpreted the radiographs.   Assessment and Plan:   68 y.o. female with complex right hip pain.  I did discuss that ultimately her x-rays today do show evidence of pincer impingement although she does not have a flagrantly positive FADIR maneuver and she is having some pain laterally as well.  I discussed that at this time in order to discern the true etiology of her underlying  pain I would recommend diagnostic injection starting with the hip joint itself.  Overall I do believe that her SI and glutes type symptoms could ultimately be related to right hip pincer impingement.  After discussion I recommended a right hip ultrasound-guided injection that we can see how much of her symptoms this relieves.  I like  to see her back in 4 weeks to assess the efficacy  -Right hip ultrasound-guided injection after verbal consent obtained   I personally saw and evaluated the patient, and participated in the management and treatment plan.  Huel Cote, MD Attending Physician, Orthopedic Surgery  This document was dictated using Dragon voice recognition software. A reasonable attempt at proof reading has been made to minimize errors.

## 2023-04-25 ENCOUNTER — Encounter: Payer: Self-pay | Admitting: Physician Assistant

## 2023-04-27 MED ORDER — SYNJARDY XR 25-1000 MG PO TB24: 1.0000 | ORAL_TABLET | Freq: Every day | ORAL | 1 refills | Status: DC

## 2023-04-28 ENCOUNTER — Telehealth: Payer: Self-pay | Admitting: Orthopaedic Surgery

## 2023-04-28 NOTE — Telephone Encounter (Signed)
Patient called advised her right hip is hurting again.  The number to contact patient is [phone]

## 2023-04-29 ENCOUNTER — Ambulatory Visit: Payer: PPO

## 2023-04-29 DIAGNOSIS — Z1231 Encounter for screening mammogram for malignant neoplasm of breast: Secondary | ICD-10-CM | POA: Diagnosis not present

## 2023-05-01 ENCOUNTER — Other Ambulatory Visit (HOSPITAL_BASED_OUTPATIENT_CLINIC_OR_DEPARTMENT_OTHER): Payer: Self-pay

## 2023-05-01 ENCOUNTER — Encounter: Payer: Self-pay | Admitting: Physician Assistant

## 2023-05-01 ENCOUNTER — Ambulatory Visit (HOSPITAL_BASED_OUTPATIENT_CLINIC_OR_DEPARTMENT_OTHER): Payer: PPO | Admitting: Orthopaedic Surgery

## 2023-05-01 DIAGNOSIS — M25851 Other specified joint disorders, right hip: Secondary | ICD-10-CM

## 2023-05-01 MED ORDER — OXYCODONE HCL 5 MG PO TABS
5.0000 mg | ORAL_TABLET | ORAL | 0 refills | Status: DC | PRN
  Filled 2023-05-01: qty 30, 5d supply, fill #0

## 2023-05-01 MED ORDER — ASPIRIN 325 MG PO TBEC
325.0000 mg | DELAYED_RELEASE_TABLET | Freq: Every day | ORAL | 0 refills | Status: DC
  Filled 2023-05-01: qty 14, 14d supply, fill #0

## 2023-05-01 MED ORDER — ACETAMINOPHEN 500 MG PO TABS
500.0000 mg | ORAL_TABLET | Freq: Three times a day (TID) | ORAL | 0 refills | Status: AC
Start: 2023-05-01 — End: 2023-05-11
  Filled 2023-05-01: qty 30, 10d supply, fill #0

## 2023-05-01 NOTE — Progress Notes (Signed)
 Normal mammogram. Follow up in 1 year.

## 2023-05-01 NOTE — Progress Notes (Signed)
Chief Complaint: Right hip pain     History of Present Illness:   05/01/2023: Today for follow-up of her right hip.  She got 3 days of approximately 100% relief.  This is subsequently worn off.  Leslie Roberson is a 68 y.o. female presents today with ongoing right hip pain for the last several years.  She has previously had an SI joint fusion which she does state gave her significant relief initially but now she is back to having pain about the lateral aspect of the hip.  Of note she is status post left total hip arthroplasty done while she was in her 48s.  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  She has had multiple injections into the hip which appear to sound lateral in nature.  The first 1 that was done gave her quite significant relief.  She has had physical therapy of the right hip without any improvement.  Overall she is here today as a referral from Dr. Magnus Ivan for ongoing discussion of her hip.    PMH/PSH/Family History/Social History/Meds/Allergies:    Past Medical History:  Diagnosis Date   Allergy 1992   Anxiety    Arthritis    "right little finger; base of thumb left hand; spine" (07/07/2017)   Asthma, chronic 11/27/2015   Cataract 2021   Chronic back pain    Coronary artery disease    4/19 PCI/DEx2 to LAD, and PCI/DES x1 to RCA, normal EF   Depression    Diabetes mellitus without complication (HCC) 2020   Diverticulitis of colon 12/21/2015   Colonoscopy every 5 years/digestive health.    GERD (gastroesophageal reflux disease) 11/27/2015   History of blood transfusion    "related to spinal fusions"   Hyperlipidemia LDL goal <70 11/29/2018   Hypertension    Hypothyroidism    Idiopathic scoliosis 01/22/2016   Osteoporosis 11/27/2015   On prolia.    Pneumonia 2016   Pneumothorax 06/26/2017   Pre-diabetes    Sleep apnea 2020   Thyroid disease    Past Surgical History:  Procedure Laterality Date   ABDOMINAL HYSTERECTOMY     bladder mesh      S/P bladder sling"   COLON SURGERY  2007   CORONARY PRESSURE/FFR STUDY N/A 07/07/2017   Procedure: INTRAVASCULAR PRESSURE WIRE/FFR STUDY;  Surgeon: Marykay Lex, MD;  Location: [facility] INVASIVE CV LAB;  Service: Cardiovascular;  Laterality: N/A;   CORONARY STENT INTERVENTION N/A 07/07/2017   Procedure: CORONARY STENT INTERVENTION;  Surgeon: Marykay Lex, MD;  Location: [facility] INVASIVE CV LAB;  Service: Cardiovascular;  Laterality: N/A;   ELBOW SURGERY Right    "dislocation"   INCONTINENCE SURGERY     "didn't work"   JOINT REPLACEMENT     LEFT HEART CATH AND CORONARY ANGIOGRAPHY N/A 07/07/2017   Procedure: LEFT HEART CATH AND CORONARY ANGIOGRAPHY;  Surgeon: Marykay Lex, MD;  Location: [facility] INVASIVE CV LAB;  Service: Cardiovascular;  Laterality: N/A;   SMALL INTESTINE SURGERY     SPINAL FUSION  ~ 1970 X 3   "below neck - lower back"   TONSILLECTOMY     TOTAL HIP ARTHROPLASTY Left    Social History   Socioeconomic History   Marital status: Married    Spouse name: [name]   Number of children: 1   Years of education: 16   Highest education level: Bachelor's degree (e.g., BA, AB, BS)  Occupational History   Occupation: Retired  Tobacco Use   Smoking status:  Never   Smokeless tobacco: Never  Vaping Use   Vaping status: Never Used  Substance and Sexual Activity   Alcohol use: Not Currently    Comment: Maybe 3 drinks/year   Drug use: No   Sexual activity: Not Currently    Birth control/protection: None  Other Topics Concern   Not on file  Social History Narrative   Lives with her husband. She has one child. She enjoys playing games on the computer.   Social Drivers of Corporate investment banker Strain: Low Risk  (03/03/2023)   Overall Financial Resource Strain (CARDIA)    Difficulty of Paying Living Expenses: Not hard at all  Food Insecurity: No Food Insecurity (03/03/2023)   Hunger Vital Sign    Worried About Running Out of Food in the Last Year: Never true    Ran Out  of Food in the Last Year: Never true  Transportation Needs: No Transportation Needs (03/03/2023)   PRAPARE - Administrator, Civil Service (Medical): No    Lack of Transportation (Non-Medical): No  Physical Activity: Inactive (03/03/2023)   Exercise Vital Sign    Days of Exercise per Week: 0 days    Minutes of Exercise per Session: 0 min  Stress: No Stress Concern Present (03/03/2023)   [employer] of Occupational Health - Occupational Stress Questionnaire    Feeling of Stress : Only a little  Social Connections: Moderately Integrated (03/03/2023)   Social Connection and Isolation Panel [NHANES]    Frequency of Communication with Friends and Family: Once a week    Frequency of Social Gatherings with Friends and Family: Once a week    Attends Religious Services: More than 4 times per year    Active Member of [employer] or Organizations: Yes    Attends Banker Meetings: Never    Marital Status: Married   Family History  Problem Relation Age of Onset   Cancer Mother        lung   Depression Mother    Early death Mother    Heart attack Father    Hyperlipidemia Father    Hypertension Father    Arthritis Father    Heart disease Father    Diabetes Maternal Aunt    Hyperlipidemia Paternal Aunt    COPD Paternal Aunt    Hypertension Paternal Aunt    Stroke Paternal Aunt    Allergies  Allergen Reactions   Hydromorphone Rash   Levofloxacin Other (See Comments)    Hallucinations   Meperidine Nausea And Vomiting and Other (See Comments)    Also "doesn't work well for my pain"   Broccoli [Brassica Oleracea] Nausea Only and Other (See Comments)    Causes stomach pain also   Adhesive [Tape] Rash   Current Outpatient Medications  Medication Sig Dispense Refill   acetaminophen (TYLENOL) 500 MG tablet Take 1 tablet (500 mg total) by mouth every 8 (eight) hours for 10 days. 30 tablet 0   aspirin EC 325 MG tablet Take 1 tablet (325 mg total) by mouth daily. 14  tablet 0   oxyCODONE (ROXICODONE) 5 MG immediate release tablet Take 1 tablet (5 mg total) by mouth every 4 (four) hours as needed for severe pain (pain score 7-10) or breakthrough pain. 30 tablet 0   albuterol (VENTOLIN HFA) 108 (90 Base) MCG/ACT inhaler INHALE 1-2 PUFFS INTO THE LUNGS EVERY 4 HOURS AS NEEDED FOR WHEEZING OR SHORTNESS OF BREATH. 18 g 2   aspirin EC 81 MG tablet Take  81 mg by mouth at bedtime.     atorvastatin (LIPITOR) 20 MG tablet Take 1 tablet (20 mg total) by mouth daily. 90 tablet 2   busPIRone (BUSPAR) 15 MG tablet Take 60 mg by mouth daily.     calcium-vitamin D (OSCAL WITH D) 500-200 MG-UNIT tablet Take 1 tablet by mouth daily.     clonazePAM (KLONOPIN) 0.5 MG tablet Take 0.25 mg by mouth at bedtime.      denosumab (PROLIA) 60 MG/ML SOLN injection Inject 60 mg into the skin every 6 (six) months. Administer in upper arm, thigh, or abdomen     desvenlafaxine (PRISTIQ) 100 MG 24 hr tablet Take 100 mg by mouth in the morning.     Empagliflozin-metFORMIN HCl ER (SYNJARDY XR) 25-1000 MG TB24 Take 1 tablet by mouth daily. 90 tablet 1   eszopiclone (LUNESTA) 2 MG TABS tablet Take 2 mg by mouth at bedtime.     fluticasone (FLONASE) 50 MCG/ACT nasal spray SPRAY 2 SPRAYS INTO EACH NOSTRIL EVERY DAY 48 mL 1   gabapentin (NEURONTIN) 300 MG capsule TAKE 1 CAPSULE IN THE MORNING AND 2 CAPSULES AT BEDTIME 270 capsule 3   ipratropium-albuterol (DUONEB) 0.5-2.5 (3) MG/3ML SOLN Take 3 mLs by nebulization every 2 (two) hours as needed (wheeze, SOB). 60 mL 0   ketoconazole (NIZORAL) 2 % shampoo Apply topically.     levothyroxine (SYNTHROID) 137 MCG tablet TAKE 1 TABLET ONCE A WEEK THEN 1/2 TABLET ALL OTHER DAYS AS DIRECTED 52 tablet 3   Menthol, Topical Analgesic, (MINERAL ICE EX) Apply 1 application to affected sites one to two times a day as needed (for pain)     metoprolol succinate (TOPROL-XL) 25 MG 24 hr tablet TAKE 1 TABLET (25 MG TOTAL) BY MOUTH DAILY. 90 tablet 2   nitroGLYCERIN  (NITROSTAT) 0.4 MG SL tablet DISSOLVE 1 TAB UNDER THE TONGUE FOR CHEST PAIN EVERY 5 MINUTES UP TO 3 DOSES IF PAIN PERSIST CALL 911/SEEK MEDICAL HELP 75 tablet 2   Omega-3 Fatty Acids (FISH OIL PO) Take 1 capsule by mouth daily.      oxybutynin (DITROPAN-XL) 5 MG 24 hr tablet TAKE 1 TABLET EVERY DAY 90 tablet 3   pantoprazole (PROTONIX) 40 MG tablet TAKE 1 TABLET EVERY DAY 90 tablet 3   REXULTI 1 MG TABS Take 1 tablet by mouth daily.      tolterodine (DETROL LA) 2 MG 24 hr capsule Take 2 mg by mouth in the morning.     trazodone (DESYREL) 300 MG tablet Take 300 mg by mouth at bedtime.     Current Facility-Administered Medications  Medication Dose Route Frequency Provider Last Rate Last Admin   [START ON 07/08/2023] denosumab (PROLIA) injection 60 mg  60 mg Subcutaneous Once Breeback, Jade L, PA-C       No results found.  Review of Systems:   A ROS was performed including pertinent positives and negatives as documented in the HPI.  Physical Exam :   Constitutional: NAD and appears stated age Neurological: Alert and oriented Psych: Appropriate affect and cooperative There were no vitals taken for this visit.   Comprehensive Musculoskeletal Exam:    Right hip with tenderness in the FADIR position.  She has 30 degrees internal/external rotation without any clicking or popping but pain with any internal rotation.  She does have no pain with resisted abduction of the right hip.  There is pain with direct palpation of the trochanteric.   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  No obvious gluteus tearing   I personally reviewed and interpreted the radiographs.   Assessment and Plan:   68 y.o. female with complex right hip pain.  I did discuss that ultimately her x-rays today do show evidence of pincer  impingement although she does not have a flagrantly positive FADIR maneuver and she is having some pain laterally as well.  I discussed that at this time in order to discern the true etiology of her underlying pain I would recommend diagnostic injection starting with the hip joint itself.  She overall got 100% relief from her injection.  As result I do believe she would ultimately benefit from right hip arthroscopy and pincer debridement.  I discussed the risks and limitations as well as associated rehab.  She would like to proceed with this.  -Plan for right hip arthroscopy with pincer debridement    After a lengthy discussion of treatment options, including risks, benefits, alternatives, complications of surgical and nonsurgical conservative options, the patient elected surgical repair.   The patient  is aware of the material risks  and complications including, but not limited to injury to adjacent structures, neurovascular injury, infection, numbness, bleeding, implant failure, thermal burns, stiffness, persistent pain, failure to heal, disease transmission from allograft, need for further surgery, dislocation, anesthetic risks, blood clots, risks of death,and others. The probabilities of surgical success and failure discussed with patient given their particular co-morbidities.The time and nature of expected rehabilitation and recovery was discussed.The patient's questions were all answered preoperatively.  No barriers to understanding were noted. I explained the natural history of the disease process and Rx rationale.  I explained to the patient what I considered to be reasonable expectations given their personal situation.  The final treatment plan was arrived at through a shared patient decision making process model.    I personally saw and evaluated the patient, and participated in the management and treatment plan.  Huel Cote, MD Attending Physician, Orthopedic Surgery  This document  was dictated using Dragon voice recognition software. A reasonable attempt at proof reading has been made to minimize errors.

## 2023-05-04 ENCOUNTER — Telehealth: Payer: Self-pay | Admitting: Cardiology

## 2023-05-04 ENCOUNTER — Telehealth: Payer: Self-pay

## 2023-05-04 ENCOUNTER — Telehealth: Payer: Self-pay | Admitting: *Deleted

## 2023-05-04 NOTE — Telephone Encounter (Signed)
Called and spoke to patient who has been scheduled for telephone visit

## 2023-05-04 NOTE — Telephone Encounter (Signed)
Patient has been scheduled for telephone visit consent is done     Patient Consent for Virtual Visit         Leslie Roberson has provided verbal consent on 05/04/2023 for a virtual visit (video or telephone).   CONSENT FOR VIRTUAL VISIT FOR:  Leslie Roberson  By participating in this virtual visit I agree to the following:  I hereby voluntarily request, consent and authorize [facility] and its employed or contracted physicians, physician assistants, nurse practitioners or other licensed health care professionals (the Practitioner), to provide me with telemedicine health care services (the "Services") as deemed necessary by the treating Practitioner. I acknowledge and consent to receive the Services by the Practitioner via telemedicine. I understand that the telemedicine visit will involve communicating with the Practitioner through live audiovisual communication technology and the disclosure of certain medical information by electronic transmission. I acknowledge that I have been given the opportunity to request an in-person assessment or other available alternative prior to the telemedicine visit and am voluntarily participating in the telemedicine visit.  I understand that I have the right to withhold or withdraw my consent to the use of telemedicine in the course of my care at any time, without affecting my right to future care or treatment, and that the Practitioner or I may terminate the telemedicine visit at any time. I understand that I have the right to inspect all information obtained and/or recorded in the course of the telemedicine visit and may receive copies of available information for a reasonable fee.  I understand that some of the potential risks of receiving the Services via telemedicine include:  Delay or interruption in medical evaluation due to technological equipment failure or disruption; Information transmitted may not be sufficient (e.g. poor resolution of  images) to allow for appropriate medical decision making by the Practitioner; and/or  In rare instances, security protocols could fail, causing a breach of personal health information.  Furthermore, I acknowledge that it is my responsibility to provide information about my medical history, conditions and care that is complete and accurate to the best of my ability. I acknowledge that Practitioner's advice, recommendations, and/or decision may be based on factors not within their control, such as incomplete or inaccurate data provided by me or distortions of diagnostic images or specimens that may result from electronic transmissions. I understand that the practice of medicine is not an exact science and that Practitioner makes no warranties or guarantees regarding treatment outcomes. I acknowledge that a copy of this consent can be made available to me via my patient portal [facility] MyChart), or I can request a printed copy by calling the office of [facility].    I understand that my insurance will be billed for this visit.   I have read or had this consent read to me. I understand the contents of this consent, which adequately explains the benefits and risks of the Services being provided via telemedicine.  I have been provided ample opportunity to ask questions regarding this consent and the Services and have had my questions answered to my satisfaction. I give my informed consent for the services to be provided through the use of telemedicine in my medical care

## 2023-05-04 NOTE — Telephone Encounter (Signed)
   Name: Leslie Roberson  DOB: June 06, 1955  MRN: [id number]  Primary Cardiologist: Olga Millers, MD   Preoperative team, please contact this patient and set up a phone call appointment for further preoperative risk assessment. Please obtain consent and complete medication review. Thank you for your help.  I confirm that guidance regarding antiplatelet and oral anticoagulation therapy has been completed and, if necessary, noted below.  Her aspirin may be held for 5 to 7 days prior to her procedure.  Please resume as soon as hemostasis is achieved.  I also confirmed the patient resides in the state of [place]. As per [facility] Medical Board telemedicine laws, the patient must reside in the state in which the provider is licensed.   Ronney Asters, NP 05/04/2023, 11:54 AM  [facility]

## 2023-05-04 NOTE — Telephone Encounter (Signed)
 Spoke with pt, Aware of dr Ludwig Clarks recommendations.

## 2023-05-04 NOTE — Telephone Encounter (Signed)
Spoke with pt, she has pain in her hip from bone spur and will be having surgery. They suggested she take ASA 325 mg. Will forward for dr Jens Som review

## 2023-05-04 NOTE — Telephone Encounter (Signed)
Pt c/o medication issue:  1. Name of Medication:   aspirin EC 325 MG tablet    2. How are you currently taking this medication (dosage and times per day)?   Take 1 tablet (325 mg total) by mouth daily.    3. Are you having a reaction (difficulty breathing--STAT)? No  4. What is your medication issue? Pt states that above medication was prescribed by another provider. Pt would like to know if she is okay to take medication. Please advise

## 2023-05-04 NOTE — Telephone Encounter (Signed)
   Pre-operative Risk Assessment    Patient Name: Leslie Roberson  DOB: Apr 30, 1955 MRN: [id number]   Date of last office visit: 08/11/2022 Date of next office visit: N/A   Request for Surgical Clearance    Procedure:   RIGHT HIP ATHROSCOPY AND DEBRIDEMENT  Date of Surgery:  Clearance TBD                                Surgeon:   Surgeon's Group or Practice Name:  [facility] Phone number:  [phone] Fax number:  [phone]   Type of Clearance Requested:   - Pharmacy:  Hold Aspirin X'S 7 DAYS   Type of Anesthesia:  General    Additional requests/questions:    Wilhemina Cash   05/04/2023, 8:09 AM

## 2023-05-05 DIAGNOSIS — E119 Type 2 diabetes mellitus without complications: Secondary | ICD-10-CM | POA: Diagnosis not present

## 2023-05-05 DIAGNOSIS — H2513 Age-related nuclear cataract, bilateral: Secondary | ICD-10-CM | POA: Diagnosis not present

## 2023-05-05 DIAGNOSIS — H04123 Dry eye syndrome of bilateral lacrimal glands: Secondary | ICD-10-CM | POA: Diagnosis not present

## 2023-05-05 DIAGNOSIS — H5213 Myopia, bilateral: Secondary | ICD-10-CM | POA: Diagnosis not present

## 2023-05-05 DIAGNOSIS — H524 Presbyopia: Secondary | ICD-10-CM | POA: Diagnosis not present

## 2023-05-05 DIAGNOSIS — H52223 Regular astigmatism, bilateral: Secondary | ICD-10-CM | POA: Diagnosis not present

## 2023-05-05 DIAGNOSIS — H35033 Hypertensive retinopathy, bilateral: Secondary | ICD-10-CM | POA: Diagnosis not present

## 2023-05-05 LAB — HM DIABETES EYE EXAM

## 2023-05-18 DIAGNOSIS — N3281 Overactive bladder: Secondary | ICD-10-CM | POA: Diagnosis not present

## 2023-05-19 ENCOUNTER — Ambulatory Visit: Payer: PPO | Attending: Cardiovascular Disease | Admitting: Student

## 2023-05-19 DIAGNOSIS — Z01810 Encounter for preprocedural cardiovascular examination: Secondary | ICD-10-CM

## 2023-05-19 NOTE — Addendum Note (Signed)
 Addended by: Carlos Levering on: 05/19/2023 03:53 PM   Modules accepted: Orders

## 2023-05-19 NOTE — Progress Notes (Addendum)
 Virtual Visit via Telephone Note   Because of Leslie Roberson's co-morbid illnesses, she is at least at moderate risk for complications without adequate follow up.  This format is felt to be most appropriate for this patient at this time.  The patient did not have access to video technology/had technical difficulties with video requiring transitioning to audio format only (telephone).  All issues noted in this document were discussed and addressed.  No physical exam could be performed with this format.  Please refer to the patient's chart for her consent to telehealth for [facility].  Evaluation Performed:  Preoperative cardiovascular risk assessment _____________   Date:  05/19/2023   Patient ID:  Leslie Roberson, DOB Jan 06, 1956, MRN [id number] Patient Location:  Home Provider location:   Office  Primary Care Provider:  Nolene Ebbs Primary Cardiologist:  Olga Millers, MD  Chief Complaint / Patient Profile   68 y.o. y/o female with a h/o CAD s/p PCI with DES to LAD and RCA April 2019, hyperlipidemia, OSA on CPAP, migraines, GERD, hypothyroidism, T2DM who is pending right hip arthroscopy and debridement by [facility] and presents today for telephonic preoperative cardiovascular risk assessment.  History of Present Illness    Leslie Roberson is a 68 y.o. female who presents via audio/video conferencing for a telehealth visit today.  Pt was last seen in cardiology clinic on 08/11/2022 by Dr. Jens Som.  At that time Leslie Roberson was stable from a cardiac standpoint.  The patient is now pending procedure as outlined above. Since her last visit, she denies any concerning cardiac symptoms. Patient denies shortness of breath, dyspnea on exertion, lower extremity edema, or PND. Patient reports stable orthopnea needing to prop up with a couple of pillows to sleep. No chest pain, pressure, or tightness. No palpitations. Patient reports very limited  activity for the last 2 years. She is independent with hygiene ADLs but she is unable to perform light household activities. Her husband takes care of all the household chores. She does very little walking inside her home.   Past Medical History    Past Medical History:  Diagnosis Date   Allergy 1992   Anxiety    Arthritis    "right little finger; base of thumb left hand; spine" (07/07/2017)   Asthma, chronic 11/27/2015   Cataract 2021   Chronic back pain    Coronary artery disease    4/19 PCI/DEx2 to LAD, and PCI/DES x1 to RCA, normal EF   Depression    Diabetes mellitus without complication (HCC) 2020   Diverticulitis of colon 12/21/2015   Colonoscopy every 5 years/digestive health.    GERD (gastroesophageal reflux disease) 11/27/2015   History of blood transfusion    "related to spinal fusions"   Hyperlipidemia LDL goal <70 11/29/2018   Hypertension    Hypothyroidism    Idiopathic scoliosis 01/22/2016   Osteoporosis 11/27/2015   On prolia.    Pneumonia 2016   Pneumothorax 06/26/2017   Pre-diabetes    Sleep apnea 2020   Thyroid disease    Past Surgical History:  Procedure Laterality Date   ABDOMINAL HYSTERECTOMY     bladder mesh     S/P bladder sling"   COLON SURGERY  2007   CORONARY PRESSURE/FFR STUDY N/A 07/07/2017   Procedure: INTRAVASCULAR PRESSURE WIRE/FFR STUDY;  Surgeon: Marykay Lex, MD;  Location: [facility] INVASIVE CV LAB;  Service: Cardiovascular;  Laterality: N/A;   CORONARY STENT INTERVENTION N/A 07/07/2017  Procedure: CORONARY STENT INTERVENTION;  Surgeon: Marykay Lex, MD;  Location: [facility] INVASIVE CV LAB;  Service: Cardiovascular;  Laterality: N/A;   ELBOW SURGERY Right    "dislocation"   INCONTINENCE SURGERY     "didn't work"   JOINT REPLACEMENT     LEFT HEART CATH AND CORONARY ANGIOGRAPHY N/A 07/07/2017   Procedure: LEFT HEART CATH AND CORONARY ANGIOGRAPHY;  Surgeon: Marykay Lex, MD;  Location: [facility] INVASIVE CV LAB;  Service: Cardiovascular;   Laterality: N/A;   SMALL INTESTINE SURGERY     SPINAL FUSION  ~ 1970 X 3   "below neck - lower back"   TONSILLECTOMY     TOTAL HIP ARTHROPLASTY Left     Allergies  Allergies  Allergen Reactions   Hydromorphone Rash   Levofloxacin Other (See Comments)    Hallucinations   Meperidine Nausea And Vomiting and Other (See Comments)    Also "doesn't work well for my pain"   Broccoli [Brassica Oleracea] Nausea Only and Other (See Comments)    Causes stomach pain also   Adhesive [Tape] Rash    Home Medications    Prior to Admission medications   Medication Sig Start Date End Date Taking? Authorizing Provider  albuterol (VENTOLIN HFA) 108 (90 Base) MCG/ACT inhaler INHALE 1-2 PUFFS INTO THE LUNGS EVERY 4 HOURS AS NEEDED FOR WHEEZING OR SHORTNESS OF BREATH. 01/19/23   Breeback, Jade L, PA-C  aspirin EC 325 MG tablet Take 1 tablet (325 mg total) by mouth daily. 05/01/23   Huel Cote, MD  aspirin EC 81 MG tablet Take 81 mg by mouth at bedtime.    [provider]  atorvastatin (LIPITOR) 20 MG tablet Take 1 tablet (20 mg total) by mouth daily. 11/26/22   Lewayne Bunting, MD  busPIRone (BUSPAR) 15 MG tablet Take 60 mg by mouth daily.    [provider]  calcium-vitamin D (OSCAL WITH D) 500-200 MG-UNIT tablet Take 1 tablet by mouth daily.    [provider]  clonazePAM (KLONOPIN) 0.5 MG tablet Take 0.25 mg by mouth at bedtime.  06/15/18   [provider]  denosumab (PROLIA) 60 MG/ML SOLN injection Inject 60 mg into the skin every 6 (six) months. Administer in upper arm, thigh, or abdomen    [provider]  desvenlafaxine (PRISTIQ) 100 MG 24 hr tablet Take 100 mg by mouth in the morning.    [provider]  Empagliflozin-metFORMIN HCl ER (SYNJARDY XR) 25-1000 MG TB24 Take 1 tablet by mouth daily. 04/27/23   Breeback, Jade L, PA-C  eszopiclone (LUNESTA) 2 MG TABS tablet Take 2 mg by mouth at bedtime. 07/11/22   [provider]   fluticasone (FLONASE) 50 MCG/ACT nasal spray SPRAY 2 SPRAYS INTO EACH NOSTRIL EVERY DAY 01/21/23   Breeback, Jade L, PA-C  gabapentin (NEURONTIN) 300 MG capsule TAKE 1 CAPSULE IN THE MORNING AND 2 CAPSULES AT BEDTIME 07/11/22   Breeback, Jade L, PA-C  ipratropium-albuterol (DUONEB) 0.5-2.5 (3) MG/3ML SOLN Take 3 mLs by nebulization every 2 (two) hours as needed (wheeze, SOB). 12/30/22   Breeback, Jade L, PA-C  ketoconazole (NIZORAL) 2 % shampoo Apply topically. 08/09/21   [provider]  levothyroxine (SYNTHROID) 137 MCG tablet TAKE 1 TABLET ONCE A WEEK THEN 1/2 TABLET ALL OTHER DAYS AS DIRECTED 11/21/22   Breeback, Jade L, PA-C  Menthol, Topical Analgesic, (MINERAL ICE EX) Apply 1 application to affected sites one to two times a day as needed (for pain)    [provider]  metoprolol succinate (TOPROL-XL) 25 MG 24 hr tablet TAKE 1 TABLET (25 MG TOTAL) BY MOUTH DAILY. 11/26/22   Lewayne Bunting, MD  nitroGLYCERIN (NITROSTAT) 0.4 MG SL tablet DISSOLVE 1 TAB UNDER THE TONGUE FOR CHEST PAIN EVERY 5 MINUTES UP TO 3 DOSES IF PAIN PERSIST CALL 911/SEEK MEDICAL HELP 12/23/21   Joylene Grapes, NP  Omega-3 Fatty Acids (FISH OIL PO) Take 1 capsule by mouth daily.     [provider]  oxybutynin (DITROPAN-XL) 5 MG 24 hr tablet TAKE 1 TABLET EVERY DAY 04/02/22   Breeback, Jade L, PA-C  oxyCODONE (ROXICODONE) 5 MG immediate release tablet Take 1 tablet (5 mg total) by mouth every 4 (four) hours as needed for severe pain (pain score 7-10) or breakthrough pain. 05/01/23   Huel Cote, MD  pantoprazole (PROTONIX) 40 MG tablet TAKE 1 TABLET EVERY DAY 01/19/23   Breeback, Jade L, PA-C  REXULTI 1 MG TABS Take 1 tablet by mouth daily.  07/16/18   [provider]  tolterodine (DETROL LA) 2 MG 24 hr capsule Take 2 mg by mouth in the morning. 10/04/21   [provider]  trazodone (DESYREL) 300 MG tablet Take 300 mg by mouth at bedtime. 06/26/22   [provider]     Physical Exam    Vital Signs:  Leslie Roberson does not have vital signs available for review today.  Given telephonic nature of communication, physical exam is limited. AAOx3. NAD. Normal affect.  Speech and respirations are unlabored.   Assessment & Plan    Primary Cardiologist: Olga Millers, MD  Preoperative cardiovascular risk assessment.  Right hip arthroscopy and debridement by Ortho care Leslie Roberson.  Chart reviewed as part of pre-operative protocol coverage. According to the RCRI, patient has a 0.9% risk of MACE. Patient reports activity equivalent to 3.3 METS (Per [facility]).   Given her immobility and inability to achieve 4.0 METS patient will need to undergo nuclear stress testing for further risk stratification prior to finalizing preoperative risk assessment.    I will route this recommendation to the requesting party via Epic fax function.  Please call with questions.  Time:   Today, I have spent 5 minutes with the patient with telehealth technology discussing medical history, symptoms, and management plan.    Informed Consent   Shared Decision Making/Informed Consent The risks [chest pain, shortness of breath, cardiac arrhythmias, dizziness, blood pressure fluctuations, myocardial infarction, stroke/transient ischemic attack, nausea, vomiting, allergic reaction, radiation exposure, metallic taste sensation and life-threatening complications (estimated to be 1 in 10,000)], benefits (risk stratification, diagnosing coronary artery disease, treatment guidance) and alternatives of a nuclear stress test were discussed in detail with Leslie Roberson and she agrees to proceed.       Carlos Levering, NP  05/19/2023, 8:22 AM

## 2023-05-19 NOTE — Addendum Note (Signed)
 Addended by: Tarri Fuller on: 05/19/2023 03:48 PM   Modules accepted: Orders

## 2023-05-22 ENCOUNTER — Ambulatory Visit (HOSPITAL_BASED_OUTPATIENT_CLINIC_OR_DEPARTMENT_OTHER): Payer: PPO | Admitting: Orthopaedic Surgery

## 2023-05-22 ENCOUNTER — Telehealth (HOSPITAL_COMMUNITY): Payer: Self-pay | Admitting: *Deleted

## 2023-05-22 NOTE — Telephone Encounter (Signed)
 Patient given detailed instructions per Myocardial Perfusion Study Information Sheet for the test on 05/27/2023 at 10:30. Patient notified to arrive 15 minutes early and that it is imperative to arrive on time for appointment to keep from having the test rescheduled.  If you need to cancel or reschedule your appointment, please call the office within 24 hours of your appointment. . Patient verbalized understanding.Daneil Dolin

## 2023-05-25 ENCOUNTER — Encounter: Payer: Self-pay | Admitting: Physician Assistant

## 2023-05-25 ENCOUNTER — Ambulatory Visit (INDEPENDENT_AMBULATORY_CARE_PROVIDER_SITE_OTHER): Payer: PPO | Admitting: Physician Assistant

## 2023-05-25 VITALS — BP 121/73 | HR 75 | Ht <= 58 in | Wt 124.2 lb

## 2023-05-25 DIAGNOSIS — R0981 Nasal congestion: Secondary | ICD-10-CM

## 2023-05-25 DIAGNOSIS — T887XXA Unspecified adverse effect of drug or medicament, initial encounter: Secondary | ICD-10-CM

## 2023-05-25 DIAGNOSIS — G2581 Restless legs syndrome: Secondary | ICD-10-CM | POA: Diagnosis not present

## 2023-05-25 NOTE — Patient Instructions (Signed)
 Afrin for 3 days Start flonase daily and continue nasal sinus rinse

## 2023-05-25 NOTE — Progress Notes (Signed)
 Acute Office Visit  Subjective:     Patient ID: Leslie Roberson, female    DOB: [birth year], 68 y.o.   MRN: [id number]  CC: sinus congestion   HPI Patient is a 68 yo female who presents with a chief complaint of sinus congestion for weeks. She states it has been worsening since last week. Denies sinus pain or pressure, sore throat, fever, chills. Endorses post-nasal drainage, dry cough. Denies any sick contacts. Endorses a history of allergies. Used nasal saline rinse which has not helped.  Zolpidem Tart ER - She has ongoing depression and was prescribed Zolpidem to help with sleep. She started this medication earlier this month. It has been helping her sleep but the last few nights she feels that she has restless leg.   .. Active Ambulatory Problems    Diagnosis Date Noted   GERD (gastroesophageal reflux disease) 11/27/2015   Osteoporosis 11/27/2015   GAD (generalized anxiety disorder) 11/27/2015   MDD (major depressive disorder), recurrent, in full remission (HCC) 11/27/2015   Chronic dryness of both eyes 11/27/2015   Acquired hypothyroidism 11/27/2015   Asthma, chronic 11/27/2015   Insomnia 11/27/2015   Seborrheic keratoses 11/27/2015   Diverticulitis of colon 12/21/2015   Hip bursitis 12/25/2015   Biceps tendonitis on right 12/25/2015   Idiopathic scoliosis 01/22/2016   Vaginal atrophy 01/31/2016   Rosacea 02/04/2016   Right knee pain 02/19/2016   Paresthesia 04/03/2016   Facet hypertrophy of lumbosacral region 04/03/2016   Chronic back pain 04/03/2016   Bad taste in mouth 07/26/2016   No energy 09/15/2016   Lump in throat 01/27/2017   Dysphagia 01/27/2017   Chronic tension-type headache, intractable 02/05/2017   Prediabetes 02/05/2017   Right leg pain 02/06/2017   Chest tightness 05/24/2017   Itchy scalp 05/24/2017   Pneumothorax 06/25/2017   Angina, class III (HCC) 07/07/2017   CAD S/P percutaneous coronary angioplasty    Tongue mass 08/14/2017   Excessive  sweating 09/22/2017   Rhinorrhea 01/04/2018   Arthralgia 02/05/2018   Nasal dryness 02/06/2018   Hyperlipidemia LDL goal <70 11/29/2018   DJD (degenerative joint disease), lumbosacral 11/29/2018   Pre-operative clearance 11/29/2018   Diabetes mellitus without complication (HCC) 02/07/2019   Migraine without aura and without status migrainosus, not intractable 02/08/2019   OAB (overactive bladder) 02/08/2019   Snoring 05/02/2019   Non-restorative sleep 05/02/2019   OSA on CPAP 05/20/2019   Ingrown right greater toenail 07/19/2019   Status post hip replacement, left 07/19/2019   Left hip pain 07/19/2019   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   Acute conjunctivitis of both eyes 01/23/2020   CAD in native artery 11/27/2015   S/P cardiac cath 07/07/2017   Paroxysmal nocturnal dyspnea 05/14/2020   Orthopnea 05/14/2020   Cough 05/18/2020   Controlled type 2 diabetes mellitus with complication, without long-term current use of insulin (HCC) 11/16/2020   Right hand pain 11/16/2020   Fall 11/16/2020   Bilateral hearing loss 04/05/2021   Impacted cerumen, left ear 04/05/2021   Ear fullness, left 04/08/2021   Mass of right axilla 05/15/2021   Closed dislocation of metacarpophalangeal joint of right thumb 11/18/2021   Community acquired pneumonia of left lower lobe of lung 12/11/2021   Sinobronchitis 07/29/2022   Need for antibiotic prophylaxis for dental procedure 08/05/2022   Routine health maintenance 09/08/2022   Fever and chills 12/26/2022   Nasal congestion 12/31/2022   Resolved Ambulatory Problems    Diagnosis Date Noted   Acute medial meniscus tear  04/23/2016   Left knee pain 04/28/2016   Mild intermittent asthma without complication 08/18/2016   Past Medical History:  Diagnosis Date   Allergy 1992   Anxiety    Arthritis    Cataract 2021   Coronary artery disease    Depression    History of blood transfusion    Hypertension    Hypothyroidism    Pneumonia  2016   Pre-diabetes    Sleep apnea 2020   Thyroid disease      Review of Systems  Constitutional:  Negative for chills and fever.  HENT:  Positive for congestion. Negative for sinus pain and sore throat.   Respiratory:  Positive for cough. Negative for shortness of breath and wheezing.   Cardiovascular:  Negative for chest pain.  Gastrointestinal: Negative.   Genitourinary: Negative.   Musculoskeletal: Negative.   Skin: Negative.   Neurological:        Restless legs  Endo/Heme/Allergies: Negative.   Psychiatric/Behavioral: Negative.        Objective:    There were no vitals taken for this visit. BP Readings from Last 3 Encounters:  05/25/23 121/73  03/04/23 105/80  01/07/23 115/60   Wt Readings from Last 3 Encounters:  05/25/23 56.4 kg  03/04/23 56.2 kg  01/07/23 55.3 kg   Physical Exam Constitutional:      Appearance: Normal appearance.  HENT:     Head: Normocephalic and atraumatic.     Right Ear: Tympanic membrane and ear canal normal.     Left Ear: Tympanic membrane and ear canal normal.     Nose: Nose normal.     Mouth/Throat:     Mouth: Mucous membranes are moist.     Pharynx: Oropharynx is clear.  Eyes:     Extraocular Movements: Extraocular movements intact.  Cardiovascular:     Rate and Rhythm: Normal rate and regular rhythm.     Pulses: Normal pulses.     Heart sounds: Normal heart sounds.  Pulmonary:     Effort: Pulmonary effort is normal.     Breath sounds: Normal breath sounds.  Musculoskeletal:        General: Normal range of motion.     Cervical back: Normal range of motion.  Skin:    General: Skin is warm.  Neurological:     Mental Status: She is alert.       Assessment & Plan:   Problem List Items Addressed This Visit   None Discussed magnesium glycinate at night to help with the restless leg feeling and relaxation  Discussed coming off Zolpidem if the restless leg doesn't go away Encouraged the use of Flonase and nasal saline daily  to help with congestion  Prescribe Afrin for 3 days F/u if not improving in the next few days  Ilean China, [place]

## 2023-05-26 ENCOUNTER — Encounter: Payer: Self-pay | Admitting: Physician Assistant

## 2023-05-27 ENCOUNTER — Ambulatory Visit (HOSPITAL_COMMUNITY): Payer: PPO | Attending: Internal Medicine

## 2023-05-27 DIAGNOSIS — Z01810 Encounter for preprocedural cardiovascular examination: Secondary | ICD-10-CM | POA: Diagnosis not present

## 2023-05-27 MED ORDER — TECHNETIUM TC 99M TETROFOSMIN IV KIT
9.6000 | PACK | Freq: Once | INTRAVENOUS | Status: AC | PRN
  Administered 2023-05-27: 9.6 via INTRAVENOUS

## 2023-06-01 ENCOUNTER — Ambulatory Visit (HOSPITAL_COMMUNITY): Payer: PPO | Attending: Cardiology

## 2023-06-01 ENCOUNTER — Encounter: Payer: Self-pay | Admitting: *Deleted

## 2023-06-01 DIAGNOSIS — Z01810 Encounter for preprocedural cardiovascular examination: Secondary | ICD-10-CM | POA: Insufficient documentation

## 2023-06-01 LAB — MYOCARDIAL PERFUSION IMAGING
LV dias vol: 32 mL (ref 46–106)
LV sys vol: 7 mL
Nuc Stress EF: 77 %
Peak HR: 96 {beats}/min
Rest HR: 70 {beats}/min
Rest Nuclear Isotope Dose: 9.6 mCi
SDS: 6
SRS: 3
SSS: 10
ST Depression (mm): 0 mm
Stress Nuclear Isotope Dose: 31 mCi
TID: 1.06

## 2023-06-01 MED ORDER — REGADENOSON 0.4 MG/5ML IV SOLN
0.4000 mg | Freq: Once | INTRAVENOUS | Status: AC
  Administered 2023-06-01: 0.4 mg via INTRAVENOUS

## 2023-06-01 MED ORDER — TECHNETIUM TC 99M TETROFOSMIN IV KIT
31.0000 | PACK | Freq: Once | INTRAVENOUS | Status: AC | PRN
  Administered 2023-06-01: 31 via INTRAVENOUS

## 2023-06-02 ENCOUNTER — Encounter: Payer: Self-pay | Admitting: Physician Assistant

## 2023-06-02 ENCOUNTER — Ambulatory Visit (INDEPENDENT_AMBULATORY_CARE_PROVIDER_SITE_OTHER): Payer: Medicare HMO | Admitting: Physician Assistant

## 2023-06-02 VITALS — BP 116/74 | HR 83 | Ht <= 58 in | Wt 123.0 lb

## 2023-06-02 DIAGNOSIS — R0989 Other specified symptoms and signs involving the circulatory and respiratory systems: Secondary | ICD-10-CM

## 2023-06-02 DIAGNOSIS — E118 Type 2 diabetes mellitus with unspecified complications: Secondary | ICD-10-CM | POA: Diagnosis not present

## 2023-06-02 DIAGNOSIS — S90425A Blister (nonthermal), left lesser toe(s), initial encounter: Secondary | ICD-10-CM | POA: Insufficient documentation

## 2023-06-02 LAB — POCT GLYCOSYLATED HEMOGLOBIN (HGB A1C): Hemoglobin A1C: 6.3 % — AB (ref 4.0–5.6)

## 2023-06-02 NOTE — Progress Notes (Signed)
 Established Patient Office Visit  Subjective   Patient ID: Leslie Roberson, female    DOB: 12/29/1955  Age: 68 y.o. MRN: [id number]  CC: 3 month follow up   HPI Patient is a 68 yo female presents today for 3 months follow up for T2DM. She does not take at home blood glucoses. She does not feel like her blood glucose runs low. She is on [place] once daily. Last A1C on 03/10/23 was 6.9. She denies chest pain, palpitations, shortness of breath, dizziness or headaches.   She has a blister in between her 1st and 2nd metatarsal on the left foot from her feet rubbing together. She states the blister had broken the skin. She states the blistering started when she bought new hiking boots to wear.   She is also experiencing chest congestion.   Active Ambulatory Problems    Diagnosis Date Noted   GERD (gastroesophageal reflux disease) 11/27/2015   Osteoporosis 11/27/2015   GAD (generalized anxiety disorder) 11/27/2015   MDD (major depressive disorder), recurrent, in full remission (HCC) 11/27/2015   Chronic dryness of both eyes 11/27/2015   Acquired hypothyroidism 11/27/2015   Asthma, chronic 11/27/2015   Insomnia 11/27/2015   Seborrheic keratoses 11/27/2015   Diverticulitis of colon 12/21/2015   Hip bursitis 12/25/2015   Biceps tendonitis on right 12/25/2015   Idiopathic scoliosis 01/22/2016   Vaginal atrophy 01/31/2016   Rosacea 02/04/2016   Right knee pain 02/19/2016   Paresthesia 04/03/2016   Facet hypertrophy of lumbosacral region 04/03/2016   Chronic back pain 04/03/2016   Bad taste in mouth 07/26/2016   No energy 09/15/2016   Lump in throat 01/27/2017   Dysphagia 01/27/2017   Chronic tension-type headache, intractable 02/05/2017   Prediabetes 02/05/2017   Right leg pain 02/06/2017   Chest tightness 05/24/2017   Itchy scalp 05/24/2017   Pneumothorax 06/25/2017   Angina, class III (HCC) 07/07/2017   CAD S/P percutaneous coronary angioplasty    Tongue mass 08/14/2017    Excessive sweating 09/22/2017   Rhinorrhea 01/04/2018   Arthralgia 02/05/2018   Nasal dryness 02/06/2018   Hyperlipidemia LDL goal <70 11/29/2018   DJD (degenerative joint disease), lumbosacral 11/29/2018   Pre-operative clearance 11/29/2018   Diabetes mellitus without complication (HCC) 02/07/2019   Migraine without aura and without status migrainosus, not intractable 02/08/2019   OAB (overactive bladder) 02/08/2019   Snoring 05/02/2019   Non-restorative sleep 05/02/2019   OSA on CPAP 05/20/2019   Ingrown right greater toenail 07/19/2019   Status post hip replacement, left 07/19/2019   Left hip pain 07/19/2019   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   Acute conjunctivitis of both eyes 01/23/2020   CAD in native artery 11/27/2015   S/P cardiac cath 07/07/2017   Paroxysmal nocturnal dyspnea 05/14/2020   Orthopnea 05/14/2020   Cough 05/18/2020   Controlled type 2 diabetes mellitus with complication, without long-term current use of insulin (HCC) 11/16/2020   Right hand pain 11/16/2020   Fall 11/16/2020   Bilateral hearing loss 04/05/2021   Impacted cerumen, left ear 04/05/2021   Ear fullness, left 04/08/2021   Mass of right axilla 05/15/2021   Closed dislocation of metacarpophalangeal joint of right thumb 11/18/2021   Community acquired pneumonia of left lower lobe of lung 12/11/2021   Sinobronchitis 07/29/2022   Need for antibiotic prophylaxis for dental procedure 08/05/2022   Routine health maintenance 09/08/2022   Fever and chills 12/26/2022   Nasal congestion 12/31/2022   Toe blister without infection, left, initial encounter  06/02/2023   Resolved Ambulatory Problems    Diagnosis Date Noted   Acute medial meniscus tear 04/23/2016   Left knee pain 04/28/2016   Mild intermittent asthma without complication 08/18/2016   Past Medical History:  Diagnosis Date   Allergy 1992   Anxiety    Arthritis    Cataract 2021   Coronary artery disease    Depression     History of blood transfusion    Hypertension    Hypothyroidism    Pneumonia 2016   Pre-diabetes    Sleep apnea 2020   Thyroid disease     Review of Systems  All other systems reviewed and are negative.  Objective:    BP 116/74 (BP Location: Left Arm, Patient Position: Sitting, Cuff Size: Normal)   Pulse 83   Ht 4\' 10"  (1.473 m)   Wt 55.8 kg   SpO2 94%   BMI 25.71 kg/m  BP Readings from Last 3 Encounters:  06/02/23 116/74  05/25/23 121/73  03/04/23 105/80   Wt Readings from Last 3 Encounters:  06/02/23 55.8 kg  05/27/23 56.2 kg  05/25/23 56.4 kg   SpO2 Readings from Last 3 Encounters:  06/02/23 94%  05/25/23 94%  03/04/23 95%      Physical Exam Constitutional:      Appearance: Normal appearance.  HENT:     Head: Normocephalic and atraumatic.  Eyes:     Extraocular Movements: Extraocular movements intact.  Cardiovascular:     Rate and Rhythm: Normal rate and regular rhythm.     Pulses: Normal pulses.     Heart sounds: Normal heart sounds.  Pulmonary:     Effort: Pulmonary effort is normal.     Breath sounds: Normal breath sounds.  Musculoskeletal:        General: Normal range of motion.     Cervical back: Normal range of motion.  Skin:    General: Skin is warm.  Neurological:     Mental Status: She is alert.    Last CBC Lab Results  Component Value Date   WBC 4.5 09/10/2022   HGB 13.6 09/10/2022   HCT 41.3 09/10/2022   MCV 90.0 09/10/2022   MCH 29.6 09/10/2022   RDW 14.1 09/10/2022   PLT 210 09/10/2022   Last metabolic panel Lab Results  Component Value Date   GLUCOSE 192 (H) 03/10/2023   NA 141 03/10/2023   K 4.2 03/10/2023   CL 100 03/10/2023   CO2 25 03/10/2023   BUN 17 03/10/2023   CREATININE 0.93 03/10/2023   EGFR 67 03/10/2023   CALCIUM 9.8 03/10/2023   PROT 6.6 03/10/2023   ALBUMIN 4.1 03/10/2023   LABGLOB 2.5 03/10/2023   BILITOT 0.3 03/10/2023   ALKPHOS 68 03/10/2023   AST 22 03/10/2023   ALT 20 03/10/2023   ANIONGAP 9  07/08/2017   Last lipids Lab Results  Component Value Date   CHOL 153 09/10/2022   HDL 75 09/10/2022   LDLCALC 55 09/10/2022   TRIG 146 09/10/2022   CHOLHDL 2.0 09/10/2022   Last hemoglobin A1c Lab Results  Component Value Date   HGBA1C 6.9 (H) 03/10/2023     Assessment & Plan:  Marland KitchenMarland KitchenMary "Kyliah Deanda" was seen today for medical management of chronic issues.  Diagnoses and all orders for this visit:  Controlled type 2 diabetes mellitus with complication, without long-term current use of insulin (HCC) -     Hemoglobin A1c  Toe blister without infection, left, initial encounter    - A1C  today: 6.3% - She declines the COVID vaccine today.  - Bunion guard or silicon toe gel covers to protect her toes from blisters.  - Zinc oxide for blisters to ensure no infection - Epson salt baths to soak her feet  - [place] allergy - shortness of breath and trouble breathing after coming off of Ambien for restless leg; now on [place] - Suggested using inhalers to help open her lungs up and bring some of the mucus   [employer]

## 2023-06-02 NOTE — Patient Instructions (Signed)
 Same medication.  Toe guard.

## 2023-06-03 ENCOUNTER — Encounter: Payer: Self-pay | Admitting: Physician Assistant

## 2023-06-03 DIAGNOSIS — R0989 Other specified symptoms and signs involving the circulatory and respiratory systems: Secondary | ICD-10-CM | POA: Insufficient documentation

## 2023-06-03 DIAGNOSIS — E039 Hypothyroidism, unspecified: Secondary | ICD-10-CM

## 2023-06-03 MED ORDER — LEVOTHYROXINE SODIUM 137 MCG PO TABS: ORAL_TABLET | ORAL | 2 refills | Status: DC

## 2023-06-09 ENCOUNTER — Telehealth (HOSPITAL_BASED_OUTPATIENT_CLINIC_OR_DEPARTMENT_OTHER): Payer: Self-pay | Admitting: Orthopaedic Surgery

## 2023-06-09 ENCOUNTER — Other Ambulatory Visit: Payer: Self-pay | Admitting: Orthopaedic Surgery

## 2023-06-09 DIAGNOSIS — M25851 Other specified joint disorders, right hip: Secondary | ICD-10-CM

## 2023-06-09 NOTE — Telephone Encounter (Signed)
 Patient states she needs to come in earlier due to pain. I sch her March 19 and also kept her appointment in April

## 2023-06-10 NOTE — Telephone Encounter (Signed)
 Spoke to patient explaining she will not be able to receive another injection as she is within her 4 week window prior to surgery. She expressed that her pain location has changed and she wanted to discuss surgical option with Dr. Steward Drone as this hsa occurred. I offered her an earlier appointment which she was unable to take.   No further questions asked.

## 2023-06-11 DIAGNOSIS — M461 Sacroiliitis, not elsewhere classified: Secondary | ICD-10-CM | POA: Diagnosis not present

## 2023-06-16 ENCOUNTER — Encounter: Payer: Self-pay | Admitting: Physician Assistant

## 2023-06-16 DIAGNOSIS — E118 Type 2 diabetes mellitus with unspecified complications: Secondary | ICD-10-CM | POA: Diagnosis not present

## 2023-06-17 ENCOUNTER — Encounter (HOSPITAL_BASED_OUTPATIENT_CLINIC_OR_DEPARTMENT_OTHER): Admitting: Orthopaedic Surgery

## 2023-06-17 ENCOUNTER — Encounter: Payer: Self-pay | Admitting: Physician Assistant

## 2023-06-17 DIAGNOSIS — L570 Actinic keratosis: Secondary | ICD-10-CM | POA: Diagnosis not present

## 2023-06-17 DIAGNOSIS — L821 Other seborrheic keratosis: Secondary | ICD-10-CM | POA: Diagnosis not present

## 2023-06-17 DIAGNOSIS — D225 Melanocytic nevi of trunk: Secondary | ICD-10-CM | POA: Diagnosis not present

## 2023-06-17 DIAGNOSIS — Z129 Encounter for screening for malignant neoplasm, site unspecified: Secondary | ICD-10-CM | POA: Diagnosis not present

## 2023-06-17 DIAGNOSIS — L820 Inflamed seborrheic keratosis: Secondary | ICD-10-CM | POA: Diagnosis not present

## 2023-06-17 DIAGNOSIS — D2272 Melanocytic nevi of left lower limb, including hip: Secondary | ICD-10-CM | POA: Diagnosis not present

## 2023-06-17 DIAGNOSIS — L218 Other seborrheic dermatitis: Secondary | ICD-10-CM | POA: Diagnosis not present

## 2023-06-17 LAB — HEMOGLOBIN A1C
Est. average glucose Bld gHb Est-mCnc: 143 mg/dL
Hgb A1c MFr Bld: 6.6 % — ABNORMAL HIGH (ref 4.8–5.6)

## 2023-06-17 NOTE — Telephone Encounter (Signed)
 Called and left a detailed voice mail message on patient home # - prolia injection not due until April 11th ( 6 mth and one day ) - hip surgery is schld for April 10th- asked that she speak with her surgeon to see when he would recommend that she come in for her Prolia injection.  And that she will need blood work around 1 -2 weeks before the injection is given

## 2023-06-17 NOTE — Progress Notes (Signed)
 A1C increasing and at 6.6. still to goal under 7.0. continue same meds but watch sugars/carbs in diet. Recheck 3 months. If not going down we might need to consider adding more medication.

## 2023-06-21 ENCOUNTER — Encounter: Payer: Self-pay | Admitting: Physician Assistant

## 2023-06-22 ENCOUNTER — Other Ambulatory Visit: Payer: Self-pay

## 2023-06-22 DIAGNOSIS — M810 Age-related osteoporosis without current pathological fracture: Secondary | ICD-10-CM

## 2023-06-22 MED ORDER — GABAPENTIN 300 MG PO CAPS: ORAL_CAPSULE | ORAL | 3 refills | Status: DC

## 2023-06-22 NOTE — Telephone Encounter (Signed)
 Requesting rx rf of gabapentin Last written 04/12/224 Last OV 06/22/2023 Upcoming appt 09/01/2023

## 2023-06-29 DIAGNOSIS — M810 Age-related osteoporosis without current pathological fracture: Secondary | ICD-10-CM | POA: Diagnosis not present

## 2023-06-30 ENCOUNTER — Encounter: Payer: Self-pay | Admitting: *Deleted

## 2023-06-30 LAB — CMP14+EGFR
ALT: 21 IU/L (ref 0–32)
AST: 22 IU/L (ref 0–40)
Albumin: 4.4 g/dL (ref 3.9–4.9)
Alkaline Phosphatase: 63 IU/L (ref 44–121)
BUN/Creatinine Ratio: 14 (ref 12–28)
BUN: 16 mg/dL (ref 8–27)
Bilirubin Total: 0.2 mg/dL (ref 0.0–1.2)
CO2: 24 mmol/L (ref 20–29)
Calcium: 9.9 mg/dL (ref 8.7–10.3)
Chloride: 102 mmol/L (ref 96–106)
Creatinine, Ser: 1.12 mg/dL — ABNORMAL HIGH (ref 0.57–1.00)
Globulin, Total: 2.3 g/dL (ref 1.5–4.5)
Glucose: 202 mg/dL — ABNORMAL HIGH (ref 70–99)
Potassium: 4.1 mmol/L (ref 3.5–5.2)
Sodium: 140 mmol/L (ref 134–144)
Total Protein: 6.7 g/dL (ref 6.0–8.5)
eGFR: 54 mL/min/{1.73_m2} — ABNORMAL LOW (ref >59)

## 2023-06-30 NOTE — Progress Notes (Signed)
 We will continue to watch kidney function. It did drop some from 3 months ago where it was above 60 again. Anything change?

## 2023-07-01 ENCOUNTER — Encounter: Payer: Self-pay | Admitting: Physician Assistant

## 2023-07-03 ENCOUNTER — Other Ambulatory Visit: Payer: Self-pay | Admitting: Physician Assistant

## 2023-07-03 MED ORDER — MIRABEGRON ER 50 MG PO TB24: 50.0000 mg | ORAL_TABLET | Freq: Every day | ORAL | 5 refills | Status: DC

## 2023-07-03 NOTE — Progress Notes (Signed)
 OAB with oxybuytnin alone added myrbetriq.

## 2023-07-03 NOTE — Telephone Encounter (Signed)
 Leslie Roberson, please see message sent by pt about the oxybutinin and advise on this.

## 2023-07-07 DIAGNOSIS — N3281 Overactive bladder: Secondary | ICD-10-CM | POA: Diagnosis not present

## 2023-07-13 ENCOUNTER — Ambulatory Visit: Admitting: Rehabilitative and Restorative Service Providers"

## 2023-07-15 ENCOUNTER — Telehealth: Payer: Self-pay

## 2023-07-15 ENCOUNTER — Other Ambulatory Visit (HOSPITAL_COMMUNITY): Payer: Self-pay

## 2023-07-15 DIAGNOSIS — M810 Age-related osteoporosis without current pathological fracture: Secondary | ICD-10-CM

## 2023-07-15 NOTE — Telephone Encounter (Signed)
 Prolia VOB initiated via [email or web address]  Next Prolia inj SCHEDULED: 07/20/23

## 2023-07-16 ENCOUNTER — Other Ambulatory Visit (HOSPITAL_COMMUNITY): Payer: Self-pay

## 2023-07-16 ENCOUNTER — Other Ambulatory Visit: Payer: Self-pay

## 2023-07-16 ENCOUNTER — Encounter (HOSPITAL_COMMUNITY): Payer: Self-pay

## 2023-07-16 MED ORDER — DENOSUMAB 60 MG/ML ~~LOC~~ SOSY
60.0000 mg | PREFILLED_SYRINGE | Freq: Once | SUBCUTANEOUS | 0 refills | Status: DC
Start: 2023-07-16 — End: 2024-02-22
  Filled 2023-07-16: qty 1, 1d supply, fill #0
  Filled 2023-07-17: qty 1, 180d supply, fill #0

## 2023-07-16 NOTE — Addendum Note (Signed)
 Addended by: Dickie Found on: 07/16/2023 02:10 PM   Modules accepted: Orders

## 2023-07-16 NOTE — Telephone Encounter (Signed)
 Spoke with patient . She would like to use the pharmacy  to receive her  Prolia from  ( Not in office)

## 2023-07-16 NOTE — Progress Notes (Signed)
 Patient to be enrolled with [facility].

## 2023-07-16 NOTE — Addendum Note (Signed)
 Addended by: Hessie Varone L on: 07/16/2023 02:31 PM   Modules accepted: Orders

## 2023-07-16 NOTE — Telephone Encounter (Signed)
 Prescription sent to [facility] pharmacy.

## 2023-07-16 NOTE — Telephone Encounter (Signed)
 Spoke with patient regarding benefit verification- she is choosing the option of pharmacy rather than buy and bill.

## 2023-07-16 NOTE — Telephone Encounter (Signed)
 Pt ready for scheduling for PROLIA on or after : 07/20/23  Option# 1: Buy/Bill (Office supplied medication)  Out-of-pocket cost due at time of clinic visit: $332  Number of injection/visits approved: ---  Primary: [employer] Prolia co-insurance: 20% Admin fee co-insurance: 0%  Secondary: --- Prolia co-insurance:  Admin fee co-insurance:   Medical Benefit Details: Date Benefits were checked: 07/15/23 Deductible: NO/ Coinsurance: 20%/ Admin Fee: 0%  Prior Auth: N/A PA# Expiration Date:   # of doses approved: ----------------------------------------------------------------------- Option# 2- Med Obtained from pharmacy:  Pharmacy benefit: Copay $250 (Paid to pharmacy) Admin Fee: 0% (Pay at clinic)  Prior Auth: N/A PA# Expiration Date:   # of doses approved:   If patient wants fill through the pharmacy benefit please send prescription to: [employer]/RX ADVANCE, and include estimated need by date in rx notes. Pharmacy will ship medication directly to the office.  Patient NOT eligible for Prolia Copay Card. Copay Card can make patient's cost as little as $25. Link to apply: [email or web address]://[email or web address]  ** This summary of benefits is an estimation of the patient's out-of-pocket cost. Exact cost may very based on individual plan coverage.

## 2023-07-17 ENCOUNTER — Other Ambulatory Visit: Payer: Self-pay

## 2023-07-17 NOTE — Progress Notes (Signed)
 Pharmacy Patient Advocate Encounter  Insurance verification completed.   The patient is insured through [facility] ADVANTAGE/RX ADVANCE   Ran test claim for Prolia. Co-pay is $250.   This test claim was processed through [facility]- copay amounts may vary at other pharmacies due to pharmacy/plan contracts, or as the patient moves through the different stages of their insurance plan.

## 2023-07-17 NOTE — Progress Notes (Signed)
 Specialty Pharmacy Initial Fill Coordination Note  Leslie Roberson is a 68 y.o. female contacted today regarding initial fill of specialty medication(s) Denosumab  (PROLIA )   Patient requested Courier to Provider Office   Delivery date: 07/21/23   Verified address: [facility] [address] [place] [address]   Medication will be filled on 4/21.   Patient is aware of $250 copayment.

## 2023-07-20 ENCOUNTER — Ambulatory Visit

## 2023-07-20 ENCOUNTER — Other Ambulatory Visit: Payer: Self-pay

## 2023-07-22 ENCOUNTER — Encounter (HOSPITAL_BASED_OUTPATIENT_CLINIC_OR_DEPARTMENT_OTHER): Admitting: Orthopaedic Surgery

## 2023-07-24 ENCOUNTER — Encounter (HOSPITAL_BASED_OUTPATIENT_CLINIC_OR_DEPARTMENT_OTHER): Payer: Self-pay | Admitting: Orthopaedic Surgery

## 2023-07-24 ENCOUNTER — Other Ambulatory Visit: Payer: Self-pay

## 2023-07-28 ENCOUNTER — Encounter (HOSPITAL_BASED_OUTPATIENT_CLINIC_OR_DEPARTMENT_OTHER)
Admission: RE | Admit: 2023-07-28 | Discharge: 2023-07-28 | Disposition: A | Discharge status: Home / Self Care | Source: Ambulatory Visit | Attending: Orthopaedic Surgery | Admitting: Orthopaedic Surgery

## 2023-07-28 DIAGNOSIS — Z01812 Encounter for preprocedural laboratory examination: Secondary | ICD-10-CM | POA: Diagnosis not present

## 2023-07-28 LAB — BASIC METABOLIC PANEL WITH GFR
Anion gap: 8 (ref 5–15)
BUN: 17 mg/dL (ref 8–23)
CO2: 29 mmol/L (ref 22–32)
Calcium: 9.3 mg/dL (ref 8.9–10.3)
Chloride: 102 mmol/L (ref 98–111)
Creatinine, Ser: 0.99 mg/dL (ref 0.44–1.00)
GFR, Estimated: >60 mL/min (ref >60)
Glucose, Bld: 223 mg/dL — ABNORMAL HIGH (ref 70–99)
Potassium: 4.3 mmol/L (ref 3.5–5.1)
Sodium: 139 mmol/L (ref 135–145)

## 2023-07-29 ENCOUNTER — Other Ambulatory Visit: Payer: Self-pay | Admitting: Physician Assistant

## 2023-07-29 ENCOUNTER — Ambulatory Visit (HOSPITAL_BASED_OUTPATIENT_CLINIC_OR_DEPARTMENT_OTHER): Payer: Self-pay | Admitting: Orthopaedic Surgery

## 2023-07-29 DIAGNOSIS — M25851 Other specified joint disorders, right hip: Secondary | ICD-10-CM

## 2023-07-30 ENCOUNTER — Encounter (HOSPITAL_BASED_OUTPATIENT_CLINIC_OR_DEPARTMENT_OTHER): Payer: Self-pay | Admitting: Orthopaedic Surgery

## 2023-07-30 ENCOUNTER — Ambulatory Visit (HOSPITAL_COMMUNITY)

## 2023-07-30 ENCOUNTER — Ambulatory Visit (HOSPITAL_BASED_OUTPATIENT_CLINIC_OR_DEPARTMENT_OTHER): Admitting: Anesthesiology

## 2023-07-30 ENCOUNTER — Encounter (HOSPITAL_BASED_OUTPATIENT_CLINIC_OR_DEPARTMENT_OTHER)
Admission: RE | Disposition: A | Discharge status: Home / Self Care | Payer: Self-pay | Source: Home / Self Care | Attending: Orthopaedic Surgery

## 2023-07-30 ENCOUNTER — Other Ambulatory Visit: Payer: Self-pay

## 2023-07-30 ENCOUNTER — Ambulatory Visit (HOSPITAL_BASED_OUTPATIENT_CLINIC_OR_DEPARTMENT_OTHER)
Admission: RE | Admit: 2023-07-30 | Discharge: 2023-07-30 | Disposition: A | Discharge status: Home / Self Care | Attending: Orthopaedic Surgery | Admitting: Orthopaedic Surgery

## 2023-07-30 ENCOUNTER — Encounter: Payer: Self-pay | Admitting: Physician Assistant

## 2023-07-30 DIAGNOSIS — I110 Hypertensive heart disease with heart failure: Secondary | ICD-10-CM

## 2023-07-30 DIAGNOSIS — I2510 Atherosclerotic heart disease of native coronary artery without angina pectoris: Secondary | ICD-10-CM | POA: Insufficient documentation

## 2023-07-30 DIAGNOSIS — M24851 Other specific joint derangements of right hip, not elsewhere classified: Secondary | ICD-10-CM | POA: Diagnosis not present

## 2023-07-30 DIAGNOSIS — G4730 Sleep apnea, unspecified: Secondary | ICD-10-CM | POA: Diagnosis not present

## 2023-07-30 DIAGNOSIS — E119 Type 2 diabetes mellitus without complications: Secondary | ICD-10-CM | POA: Insufficient documentation

## 2023-07-30 DIAGNOSIS — G8929 Other chronic pain: Secondary | ICD-10-CM | POA: Diagnosis not present

## 2023-07-30 DIAGNOSIS — Z955 Presence of coronary angioplasty implant and graft: Secondary | ICD-10-CM | POA: Insufficient documentation

## 2023-07-30 DIAGNOSIS — I509 Heart failure, unspecified: Secondary | ICD-10-CM | POA: Diagnosis not present

## 2023-07-30 DIAGNOSIS — F32A Depression, unspecified: Secondary | ICD-10-CM | POA: Diagnosis not present

## 2023-07-30 DIAGNOSIS — J45909 Unspecified asthma, uncomplicated: Secondary | ICD-10-CM | POA: Diagnosis not present

## 2023-07-30 DIAGNOSIS — F419 Anxiety disorder, unspecified: Secondary | ICD-10-CM | POA: Diagnosis not present

## 2023-07-30 DIAGNOSIS — Z79899 Other long term (current) drug therapy: Secondary | ICD-10-CM | POA: Insufficient documentation

## 2023-07-30 DIAGNOSIS — I5020 Unspecified systolic (congestive) heart failure: Secondary | ICD-10-CM

## 2023-07-30 DIAGNOSIS — R519 Headache, unspecified: Secondary | ICD-10-CM | POA: Insufficient documentation

## 2023-07-30 DIAGNOSIS — Z7984 Long term (current) use of oral hypoglycemic drugs: Secondary | ICD-10-CM | POA: Diagnosis not present

## 2023-07-30 DIAGNOSIS — K219 Gastro-esophageal reflux disease without esophagitis: Secondary | ICD-10-CM | POA: Insufficient documentation

## 2023-07-30 DIAGNOSIS — M1990 Unspecified osteoarthritis, unspecified site: Secondary | ICD-10-CM | POA: Insufficient documentation

## 2023-07-30 DIAGNOSIS — E039 Hypothyroidism, unspecified: Secondary | ICD-10-CM | POA: Insufficient documentation

## 2023-07-30 DIAGNOSIS — Z7982 Long term (current) use of aspirin: Secondary | ICD-10-CM | POA: Insufficient documentation

## 2023-07-30 DIAGNOSIS — M25351 Other instability, right hip: Secondary | ICD-10-CM | POA: Diagnosis not present

## 2023-07-30 DIAGNOSIS — M25851 Other specified joint disorders, right hip: Secondary | ICD-10-CM

## 2023-07-30 DIAGNOSIS — Z833 Family history of diabetes mellitus: Secondary | ICD-10-CM | POA: Insufficient documentation

## 2023-07-30 DIAGNOSIS — Z0189 Encounter for other specified special examinations: Secondary | ICD-10-CM | POA: Diagnosis not present

## 2023-07-30 DIAGNOSIS — I10 Essential (primary) hypertension: Secondary | ICD-10-CM | POA: Diagnosis not present

## 2023-07-30 DIAGNOSIS — E118 Type 2 diabetes mellitus with unspecified complications: Secondary | ICD-10-CM

## 2023-07-30 HISTORY — PX: HIP ARTHROSCOPY: SHX668

## 2023-07-30 LAB — GLUCOSE, CAPILLARY
Glucose-Capillary: 100 mg/dL — ABNORMAL HIGH (ref 70–99)
Glucose-Capillary: 121 mg/dL — ABNORMAL HIGH (ref 70–99)

## 2023-07-30 SURGERY — ARTHROSCOPY HIP
Anesthesia: General | Site: Hip | Laterality: Right

## 2023-07-30 MED ORDER — ONDANSETRON HCL 4 MG/2ML IJ SOLN: 4.0000 mg | Freq: Once | INTRAMUSCULAR | Status: DC | PRN

## 2023-07-30 MED ORDER — GLYCOPYRROLATE PF 0.2 MG/ML IJ SOSY
PREFILLED_SYRINGE | INTRAMUSCULAR | Status: AC
  Filled 2023-07-30: qty 1

## 2023-07-30 MED ORDER — OXYCODONE HCL 5 MG/5ML PO SOLN: 5.0000 mg | Freq: Once | ORAL | Status: AC | PRN

## 2023-07-30 MED ORDER — SUGAMMADEX SODIUM 200 MG/2ML IV SOLN
INTRAVENOUS | Status: DC | PRN
  Administered 2023-07-30: 150 mg via INTRAVENOUS

## 2023-07-30 MED ORDER — ONDANSETRON HCL 4 MG/2ML IJ SOLN
INTRAMUSCULAR | Status: DC | PRN
  Administered 2023-07-30: 4 mg via INTRAVENOUS

## 2023-07-30 MED ORDER — ONDANSETRON HCL 4 MG/2ML IJ SOLN
INTRAMUSCULAR | Status: AC
  Filled 2023-07-30: qty 2

## 2023-07-30 MED ORDER — FENTANYL CITRATE (PF) 100 MCG/2ML IJ SOLN
INTRAMUSCULAR | Status: AC
  Filled 2023-07-30: qty 2

## 2023-07-30 MED ORDER — EPHEDRINE 5 MG/ML INJ
INTRAVENOUS | Status: AC
  Filled 2023-07-30: qty 5

## 2023-07-30 MED ORDER — ACETAMINOPHEN 500 MG PO TABS
ORAL_TABLET | ORAL | Status: AC
  Filled 2023-07-30: qty 2

## 2023-07-30 MED ORDER — GABAPENTIN 300 MG PO CAPS
ORAL_CAPSULE | ORAL | Status: AC
  Filled 2023-07-30: qty 1

## 2023-07-30 MED ORDER — MIDAZOLAM HCL 2 MG/2ML IJ SOLN
INTRAMUSCULAR | Status: AC
  Filled 2023-07-30: qty 2

## 2023-07-30 MED ORDER — ACETAMINOPHEN 500 MG PO TABS: 1000.0000 mg | ORAL_TABLET | Freq: Once | ORAL | Status: AC

## 2023-07-30 MED ORDER — OXYCODONE HCL 5 MG PO TABS
ORAL_TABLET | ORAL | Status: AC
  Filled 2023-07-30: qty 1

## 2023-07-30 MED ORDER — CEFAZOLIN SODIUM-DEXTROSE 2-4 GM/100ML-% IV SOLN
2.0000 g | INTRAVENOUS | Status: AC
  Administered 2023-07-30: 2 g via INTRAVENOUS

## 2023-07-30 MED ORDER — MIDAZOLAM HCL 5 MG/5ML IJ SOLN
INTRAMUSCULAR | Status: DC | PRN
  Administered 2023-07-30: 2 mg via INTRAVENOUS

## 2023-07-30 MED ORDER — PHENYLEPHRINE HCL (PRESSORS) 10 MG/ML IV SOLN
INTRAVENOUS | Status: DC | PRN
  Administered 2023-07-30 (×3): 80 ug via INTRAVENOUS
  Administered 2023-07-30: 160 ug via INTRAVENOUS
  Administered 2023-07-30 (×5): 80 ug via INTRAVENOUS

## 2023-07-30 MED ORDER — ROCURONIUM BROMIDE 10 MG/ML (PF) SYRINGE
PREFILLED_SYRINGE | INTRAVENOUS | Status: AC
  Filled 2023-07-30: qty 10

## 2023-07-30 MED ORDER — ACETAMINOPHEN 500 MG PO TABS: 500.0000 mg | ORAL_TABLET | Freq: Three times a day (TID) | ORAL | 0 refills | Status: AC

## 2023-07-30 MED ORDER — OXYCODONE HCL 5 MG PO TABS: 5.0000 mg | ORAL_TABLET | ORAL | 0 refills | Status: DC | PRN

## 2023-07-30 MED ORDER — AMISULPRIDE (ANTIEMETIC) 5 MG/2ML IV SOLN: 10.0000 mg | Freq: Once | INTRAVENOUS | Status: DC | PRN

## 2023-07-30 MED ORDER — BUPIVACAINE HCL 0.25 % IJ SOLN
INTRAMUSCULAR | Status: DC | PRN
  Administered 2023-07-30: 20 mL

## 2023-07-30 MED ORDER — FENTANYL CITRATE (PF) 100 MCG/2ML IJ SOLN
INTRAMUSCULAR | Status: DC | PRN
  Administered 2023-07-30 (×2): 50 ug via INTRAVENOUS

## 2023-07-30 MED ORDER — LACTATED RINGERS IV SOLN: INTRAVENOUS | Status: DC

## 2023-07-30 MED ORDER — TRANEXAMIC ACID-NACL 1000-0.7 MG/100ML-% IV SOLN
1000.0000 mg | INTRAVENOUS | Status: AC
  Administered 2023-07-30: 1000 mg via INTRAVENOUS

## 2023-07-30 MED ORDER — BUPIVACAINE HCL (PF) 0.25 % IJ SOLN
INTRAMUSCULAR | Status: AC
  Filled 2023-07-30: qty 30

## 2023-07-30 MED ORDER — LIDOCAINE HCL (CARDIAC) PF 100 MG/5ML IV SOSY
PREFILLED_SYRINGE | INTRAVENOUS | Status: DC | PRN
  Administered 2023-07-30: 60 mg via INTRAVENOUS

## 2023-07-30 MED ORDER — PHENYLEPHRINE 80 MCG/ML (10ML) SYRINGE FOR IV PUSH (FOR BLOOD PRESSURE SUPPORT)
PREFILLED_SYRINGE | INTRAVENOUS | Status: AC
  Filled 2023-07-30: qty 10

## 2023-07-30 MED ORDER — GLYCOPYRROLATE 0.2 MG/ML IJ SOLN
INTRAMUSCULAR | Status: DC | PRN
  Administered 2023-07-30: .2 mg via INTRAVENOUS

## 2023-07-30 MED ORDER — OXYCODONE HCL 5 MG PO TABS
5.0000 mg | ORAL_TABLET | Freq: Once | ORAL | Status: AC | PRN
  Administered 2023-07-30: 5 mg via ORAL

## 2023-07-30 MED ORDER — DEXAMETHASONE SODIUM PHOSPHATE 10 MG/ML IJ SOLN
INTRAMUSCULAR | Status: AC
  Filled 2023-07-30: qty 1

## 2023-07-30 MED ORDER — FENTANYL CITRATE (PF) 100 MCG/2ML IJ SOLN
INTRAMUSCULAR | Status: AC
Start: 2023-07-30 — End: ?
  Filled 2023-07-30: qty 2

## 2023-07-30 MED ORDER — FENTANYL CITRATE (PF) 100 MCG/2ML IJ SOLN
25.0000 ug | INTRAMUSCULAR | Status: DC | PRN
  Administered 2023-07-30: 50 ug via INTRAVENOUS

## 2023-07-30 MED ORDER — DEXAMETHASONE SODIUM PHOSPHATE 4 MG/ML IJ SOLN
INTRAMUSCULAR | Status: DC | PRN
  Administered 2023-07-30: 4 mg via INTRAVENOUS

## 2023-07-30 MED ORDER — PROPOFOL 10 MG/ML IV BOLUS
INTRAVENOUS | Status: DC | PRN
  Administered 2023-07-30: 100 mg via INTRAVENOUS

## 2023-07-30 MED ORDER — ROCURONIUM BROMIDE 100 MG/10ML IV SOLN
INTRAVENOUS | Status: DC | PRN
  Administered 2023-07-30: 50 mg via INTRAVENOUS

## 2023-07-30 MED ORDER — ASPIRIN 325 MG PO TBEC: 325.0000 mg | DELAYED_RELEASE_TABLET | Freq: Every day | ORAL | 0 refills | Status: DC

## 2023-07-30 MED ORDER — PROPOFOL 10 MG/ML IV BOLUS
INTRAVENOUS | Status: AC
  Filled 2023-07-30: qty 20

## 2023-07-30 MED ORDER — ACETAMINOPHEN 500 MG PO TABS
1000.0000 mg | ORAL_TABLET | Freq: Once | ORAL | Status: AC
Start: 2023-07-30 — End: 2023-07-30
  Administered 2023-07-30: 1000 mg via ORAL

## 2023-07-30 MED ORDER — TRANEXAMIC ACID-NACL 1000-0.7 MG/100ML-% IV SOLN
INTRAVENOUS | Status: AC
  Filled 2023-07-30: qty 100

## 2023-07-30 MED ORDER — EPHEDRINE SULFATE (PRESSORS) 50 MG/ML IJ SOLN
INTRAMUSCULAR | Status: DC | PRN
  Administered 2023-07-30 (×3): 5 mg via INTRAVENOUS
  Administered 2023-07-30: 10 mg via INTRAVENOUS

## 2023-07-30 MED ORDER — CEFAZOLIN SODIUM-DEXTROSE 2-4 GM/100ML-% IV SOLN
INTRAVENOUS | Status: AC
  Filled 2023-07-30: qty 100

## 2023-07-30 MED ORDER — GABAPENTIN 300 MG PO CAPS
300.0000 mg | ORAL_CAPSULE | Freq: Once | ORAL | Status: AC
  Administered 2023-07-30: 300 mg via ORAL

## 2023-07-30 MED ORDER — SODIUM CHLORIDE 0.9 % IR SOLN
Status: DC | PRN
  Administered 2023-07-30: 9000 mL

## 2023-07-30 SURGICAL SUPPLY — 57 items
ANCHOR SUT 1.4 FLEX (Anchor) IMPLANT
BIT DRILL FLEX NANOTACK (BIT) IMPLANT
BLADE SAMURAI STR FULL RADIUS (BLADE) IMPLANT
BLADE SURG 11 STRL SS (BLADE) ×1 IMPLANT
BUR ROUND HI FLUTE 8 4X19 (BURR) IMPLANT
CANISTER SUCT 1200ML W/VALVE (MISCELLANEOUS) ×1 IMPLANT
CANNULA OBTURATOR FLOWPORT ST5 (CANNULA) IMPLANT
CHLORAPREP W/TINT 26 (MISCELLANEOUS) ×1 IMPLANT
COOLER ICEMAN CLASSIC (MISCELLANEOUS) ×1 IMPLANT
COVER BACK TABLE 60X90IN (DRAPES) ×1 IMPLANT
COVER MAYO STAND STRL (DRAPES) ×2 IMPLANT
DERMABOND ADVANCED .7 DNX12 (GAUZE/BANDAGES/DRESSINGS) IMPLANT
DISSECTOR 4.2MMX19CM HL (MISCELLANEOUS) ×1 IMPLANT
DRAPE C-ARM 42X72 X-RAY (DRAPES) ×1 IMPLANT
DRAPE STERI IOBAN 125X83 (DRAPES) IMPLANT
DRAPE U-SHAPE 47X51 STRL (DRAPES) ×2 IMPLANT
DRSG TEGADERM 4X4.75 (GAUZE/BANDAGES/DRESSINGS) ×3 IMPLANT
FEE RENTAL EQUIP HIP INSTR KIT (INSTRUMENTS) IMPLANT
GAUZE PAD ABD 8X10 STRL (GAUZE/BANDAGES/DRESSINGS) IMPLANT
GAUZE SPONGE 4X4 12PLY STRL (GAUZE/BANDAGES/DRESSINGS) ×1 IMPLANT
GAUZE XEROFORM 1X8 LF (GAUZE/BANDAGES/DRESSINGS) ×1 IMPLANT
GLOVE BIO SURGEON STRL SZ 6 (GLOVE) ×2 IMPLANT
GLOVE BIO SURGEON STRL SZ7.5 (GLOVE) ×2 IMPLANT
GLOVE BIOGEL PI IND STRL 6.5 (GLOVE) ×1 IMPLANT
GLOVE BIOGEL PI IND STRL 8 (GLOVE) ×1 IMPLANT
GOWN STRL REUS W/ TWL LRG LVL3 (GOWN DISPOSABLE) ×2 IMPLANT
GOWN STRL REUS W/TWL XL LVL3 (GOWN DISPOSABLE) ×1 IMPLANT
INSTRUMENT ORTHO TEXT HIP FEM (INSTRUMENTS) IMPLANT
KIT PATIENT POSITION LRG (KITS) IMPLANT
KIT PORTAL ENTRY HIP ACCESS (KITS) IMPLANT
MANIFOLD NEPTUNE II (INSTRUMENTS) ×1 IMPLANT
NDL HYPO 22X1.5 SAFETY MO (MISCELLANEOUS) IMPLANT
NDL INJECTOR II CARTRIDGE (MISCELLANEOUS) IMPLANT
NDL SPNL 18GX3.5 QUINCKE PK (NEEDLE) ×1 IMPLANT
NDL SUT 6 .5 CRC .975X.05 MAYO (NEEDLE) IMPLANT
NEEDLE HYPO 22X1.5 SAFETY MO (MISCELLANEOUS) IMPLANT
NEEDLE INJECTOR II CARTRIDGE (MISCELLANEOUS) ×1 IMPLANT
NEEDLE SPNL 18GX3.5 QUINCKE PK (NEEDLE) ×1 IMPLANT
PACK BASIN DAY SURGERY FS (CUSTOM PROCEDURE TRAY) ×1 IMPLANT
PAD COLD SHLDR WRAP-ON (PAD) ×1 IMPLANT
PASSER SUT 1.5D CRESCENT (INSTRUMENTS) IMPLANT
SPIKE FLUID TRANSFER (MISCELLANEOUS) IMPLANT
SPONGE T-LAP 18X18 ~~LOC~~+RFID (SPONGE) IMPLANT
SUCTION TUBE FRAZIER 10FR DISP (SUCTIONS) IMPLANT
SUT ETHILON 3 0 PS 1 (SUTURE) ×1 IMPLANT
SUT VIC AB 0 CT1 27XBRD ANBCTR (SUTURE) IMPLANT
SUT VIC AB 2-0 CT1 TAPERPNT 27 (SUTURE) IMPLANT
SUT XBRAID 1.4 WHITE/BLUE (SUTURE) IMPLANT
SUTURE FIBERWR #2 38 T-5 BLUE (SUTURE) IMPLANT
SYR 20ML LL LF (SYRINGE) IMPLANT
SYR 50ML LL SCALE MARK (SYRINGE) ×1 IMPLANT
TOWEL GREEN STERILE FF (TOWEL DISPOSABLE) ×2 IMPLANT
TRAY ARTHROSCOPY HIP STRE FEE (INSTRUMENTS) ×1 IMPLANT
TRAY PIVOT PORT STRE FEE (INSTRUMENTS) ×1 IMPLANT
TUBE CONNECTING 20X1/4 (TUBING) ×3 IMPLANT
TUBING ARTHROSCOPY IRRIG 16FT (MISCELLANEOUS) ×1 IMPLANT
WAND APOLLO RF 50D ABLATOR (BUR) ×1 IMPLANT

## 2023-07-30 NOTE — Brief Op Note (Signed)
   Brief Op Note  Date of Surgery: 07/30/2023  Preoperative Diagnosis: RIGHT HIP IMPINGEMENT  Postoperative Diagnosis: same  Procedure: Procedure(s): ARTHROSCOPY HIP  Implants: Implant Name Type Inv. Item Serial No. Manufacturer Lot No. LRB No. Used Action  ANCHOR SUT 1.4 FLEX - [id number] Anchor ANCHOR SUT 1.4 FLEX  STRYKER [employer] [id number] Right 2 Implanted    Surgeons: Surgeon(s): Wilhelmenia Harada, MD  Anesthesia: General    Estimated Blood Loss: See anesthesia record  Complications: None  Condition to PACU: Stable  Carmina Chris, MD 07/30/2023 3:04 PM

## 2023-07-30 NOTE — Interval H&P Note (Signed)
 History and Physical Interval Note:  07/30/2023 12:45 PM  Leslie Roberson  has presented today for surgery, with the diagnosis of RIGHT HIP IMPINGEMENT.  The various methods of treatment have been discussed with the patient and family. After consideration of risks, benefits and other options for treatment, the patient has consented to  Procedure(s) with comments: ARTHROSCOPY HIP (Right) - RIGHT HIP ARTHROSCOPY WITH PINCER DEBRIDEMENT as a surgical intervention.  The patient's history has been reviewed, patient examined, no change in status, stable for surgery.  I have reviewed the patient's chart and labs.  Questions were answered to the patient's satisfaction.     Han Vejar

## 2023-07-30 NOTE — Discharge Instructions (Addendum)
 Discharge Instructions    Attending Surgeon: Wilhelmenia Harada, MD Office Phone Number: [phone]   Diagnosis and Procedures:    Surgeries Performed: Right hip pincer lesion  Discharge Plan:    Diet: Resume usual diet. Begin with light or bland foods.  Drink plenty of fluids.  Activity:  Weight bearing as tolerated right leg. You are advised to go home directly from the hospital or surgical center. Restrict your activities.  GENERAL INSTRUCTIONS: 1.  Please apply ice to your wound to help with swelling and inflammation. This will improve your comfort and your overall recovery following surgery.     2. Please call Dr. Verline Glow office at [phone] with questions Monday-Friday during business hours. If no one answers, please leave a message and someone should get back to the patient within 24 hours. For emergencies please call 911 or proceed to the emergency room.   3. Patient to notify surgical team if experiences any of the following: Bowel/Bladder dysfunction, uncontrolled pain, nerve/muscle weakness, incision with increased drainage or redness, nausea/vomiting and Fever greater than 101.0 F.  Be alert for signs of infection including redness, streaking, odor, fever or chills. Be alert for excessive pain or bleeding and notify your surgeon immediately.  WOUND INSTRUCTIONS:   Leave your dressing, cast, or splint in place until your post operative visit.  Keep it clean and dry.  Always keep the incision clean and dry until the staples/sutures are removed. If there is no drainage from the incision you should keep it open to air. If there is drainage from the incision you must keep it covered at all times until the drainage stops  Do not soak in a bath tub, hot tub, pool, lake or other body of water until 21 days after your surgery and your incision is completely dry and healed.  If you have removable sutures (or staples) they must be removed 10-14 days (unless otherwise  instructed) from the day of your surgery.     1)  Elevate the extremity as much as possible.  2)  Keep the dressing clean and dry.  3)  Please call us  if the dressing becomes wet or dirty.  4)  If you are experiencing worsening pain or worsening swelling, please call.     MEDICATIONS: Resume all previous home medications at the previous prescribed dose and frequency unless otherwise noted Start taking the  pain medications on an as-needed basis as prescribed  Please taper down pain medication over the next week following surgery.  Ideally you should not require a refill of any narcotic pain medication.  Take pain medication with food to minimize nausea. In addition to the prescribed pain medication, you may take over-the-counter pain relievers such as Tylenol .  Do NOT take additional tylenol  if your pain medication already has tylenol  in it. No Tylenol  until after 6:45pm today. Aspirin  325mg  daily per instructions on bottle. Narcotic policy: Per [facility] clinic policy, our goal is ensure optimal postoperative pain control with a multimodal pain management strategy. For all [facility] patients, our goal is to wean post-operative narcotic medications by 6 weeks post-operatively, and many times sooner. If this is not possible due to utilization of pain medication prior to surgery, your [facility] doctor will support your acute post-operative pain control for the first 6 weeks postoperatively, with a plan to transition you back to your primary pain team following that. Max Spain will work to ensure a Therapist, occupational.       FOLLOWUP INSTRUCTIONS: 1. Follow  up at the Physical Therapy Clinic 3-4 days following surgery. This appointment should be scheduled unless other arrangements have been made.The Physical Therapy scheduling number is [phone] if an appointment has not already been arranged.  2. Contact Dr. Verline Glow office during office hours at [phone] or the practice after hours line  at [phone] for non-emergencies. For medical emergencies call 911.   Discharge Location: Home  Post Anesthesia Home Care Instructions  Activity: Get plenty of rest for the remainder of the day. A responsible individual must stay with you for 24 hours following the procedure.  For the next 24 hours, DO NOT: -Drive a car -Advertising copywriter -Drink alcoholic beverages -Take any medication unless instructed by your physician -Make any legal decisions or sign important papers.  Meals: Start with liquid foods such as gelatin or soup. Progress to regular foods as tolerated. Avoid greasy, spicy, heavy foods. If nausea and/or vomiting occur, drink only clear liquids until the nausea and/or vomiting subsides. Call your physician if vomiting continues.  Special Instructions/Symptoms: Your throat may feel dry or sore from the anesthesia or the breathing tube placed in your throat during surgery. If this causes discomfort, gargle with warm salt water. The discomfort should disappear within 24 hours.  If you had a scopolamine patch placed behind your ear for the management of post- operative nausea and/or vomiting:  1. The medication in the patch is effective for 72 hours, after which it should be removed.  Wrap patch in a tissue and discard in the trash. Wash hands thoroughly with soap and water. 2. You may remove the patch earlier than 72 hours if you experience unpleasant side effects which may include dry mouth, dizziness or visual disturbances. 3. Avoid touching the patch. Wash your hands with soap and water after contact with the patch.

## 2023-07-30 NOTE — Anesthesia Procedure Notes (Signed)
 Procedure Name: Intubation Date/Time: 07/30/2023 1:44 PM  Performed by: Arvilla Birmingham, CRNAPre-anesthesia Checklist: Patient identified, Emergency Drugs available, Suction available and Patient being monitored Patient Re-evaluated:Patient Re-evaluated prior to induction Oxygen Delivery Method: Circle system utilized Preoxygenation: Pre-oxygenation with 100% oxygen Induction Type: IV induction Ventilation: Mask ventilation without difficulty Laryngoscope Size: Mac and 3 Grade View: Grade I Tube type: Oral Tube size: 7.0 mm Number of attempts: 1 Airway Equipment and Method: Stylet and Oral airway Placement Confirmation: ETT inserted through vocal cords under direct vision, positive ETCO2 and breath sounds checked- equal and bilateral Secured at: 20 cm Tube secured with: Tape Dental Injury: Teeth and Oropharynx as per pre-operative assessment

## 2023-07-30 NOTE — Anesthesia Postprocedure Evaluation (Signed)
 Anesthesia Post Note  Patient: Leslie Roberson  Procedure(s) Performed: ARTHROSCOPY HIP (Right: Hip)     Patient location during evaluation: PACU Anesthesia Type: General Level of consciousness: awake and alert, oriented and patient cooperative Pain management: pain level controlled Vital Signs Assessment: post-procedure vital signs reviewed and stable Respiratory status: spontaneous breathing, nonlabored ventilation and respiratory function stable Cardiovascular status: blood pressure returned to baseline and stable Postop Assessment: no apparent nausea or vomiting Anesthetic complications: no   No notable events documented.  Last Vitals:  Vitals:   07/30/23 1533 07/30/23 1545  BP: 130/77 127/75  Pulse: 82 77  Resp: (!) 22 18  Temp: (!) 36.1 C   SpO2: 99% 93%    Last Pain:  Vitals:   07/30/23 1533  TempSrc:   PainSc: 0-No pain                 Jacquelyne Matte

## 2023-07-30 NOTE — Transfer of Care (Signed)
 Immediate Anesthesia Transfer of Care Note  Patient: Leslie Roberson  Procedure(s) Performed: ARTHROSCOPY HIP (Right: Hip)  Patient Location: PACU  Anesthesia Type:General  Level of Consciousness: awake and patient cooperative  Airway & Oxygen Therapy: Patient Spontanous Breathing and Patient connected to face mask oxygen  Post-op Assessment: Report given to RN and Post -op Vital signs reviewed and stable  Post vital signs: Reviewed and stable  Last Vitals:  Vitals Value Taken Time  BP 130/77 07/30/23 1533  Temp 36.1 C 07/30/23 1533  Pulse 78 07/30/23 1543  Resp 18 07/30/23 1543  SpO2 93 % 07/30/23 1543  Vitals shown include unfiled device data.  Last Pain:  Vitals:   07/30/23 1243  TempSrc: Oral  PainSc: 9       Patients Stated Pain Goal: 7 (07/30/23 1243)  Complications: No notable events documented.

## 2023-07-30 NOTE — Anesthesia Preprocedure Evaluation (Addendum)
 Anesthesia Evaluation  Patient identified by MRN, date of birth, ID band Patient awake    Reviewed: Allergy & Precautions, NPO status , Patient's Chart, lab work & pertinent test results, reviewed documented beta blocker date and time   Airway Mallampati: IV  TM Distance: >3 FB Neck ROM: Full  Mouth opening: Limited Mouth Opening  Dental  (+) Teeth Intact, Dental Advisory Given   Pulmonary asthma (rescue inhaler rarely) , sleep apnea (cant tolerate cpap)    Pulmonary exam normal breath sounds clear to auscultation       Cardiovascular hypertension (138/73 preop), Pt. on medications and Pt. on home beta blockers + CAD, + Cardiac Stents (2019) and +CHF (normal LVEF, grade 1 diastolic dysfunction)  Normal cardiovascular exam Rhythm:Regular Rate:Normal  Echo 2022  1. Left ventricular ejection fraction, by estimation, is 60 to 65%. The  left ventricle has normal function. The left ventricle has no regional  wall motion abnormalities. Left ventricular diastolic parameters are  consistent with Grade I diastolic  dysfunction (impaired relaxation).   2. Right ventricular systolic function is normal. The right ventricular  size is normal.   3. The mitral valve is normal in structure. No evidence of mitral valve  regurgitation. No evidence of mitral stenosis.   4. The aortic valve is normal in structure. Aortic valve regurgitation is  not visualized. No aortic stenosis is present.   5. The inferior vena cava is normal in size with greater than 50%  respiratory variability, suggesting right atrial pressure of 3 mmHg.     Neuro/Psych  Headaches PSYCHIATRIC DISORDERS Anxiety Depression       GI/Hepatic Neg liver ROS,GERD  Controlled,,  Endo/Other  diabetes (prediabetic), Well Controlled, Type 2, Oral Hypoglycemic AgentsHypothyroidism  FS 100 preop  Renal/GU negative Renal ROS  negative genitourinary   Musculoskeletal  (+) Arthritis ,  Osteoarthritis,    Abdominal   Peds  Hematology negative hematology ROS (+)   Anesthesia Other Findings   Reproductive/Obstetrics negative OB ROS                             Anesthesia Physical Anesthesia Plan  ASA: 3  Anesthesia Plan: General   Post-op Pain Management: Tylenol  PO (pre-op)*   Induction: Intravenous  PONV Risk Score and Plan: 3 and Ondansetron , Dexamethasone , Midazolam  and Treatment may vary due to age or medical condition  Airway Management Planned: Oral ETT  Additional Equipment: None  Intra-op Plan:   Post-operative Plan: Extubation in OR  Informed Consent: I have reviewed the patients History and Physical, chart, labs and discussed the procedure including the risks, benefits and alternatives for the proposed anesthesia with the patient or authorized representative who has indicated his/her understanding and acceptance.     Dental advisory given  Plan Discussed with: CRNA  Anesthesia Plan Comments:        Anesthesia Quick Evaluation

## 2023-07-30 NOTE — H&P (Signed)
 Expand All Collapse All       Chief Complaint: Right hip pain        History of Present Illness:    05/01/2023: Today for follow-up of her right hip.  She got 3 days of approximately 100% relief.  This is subsequently worn off.   Leslie Roberson is a 68 y.o. female presents today with ongoing right hip pain for the last several years.  She has previously had an SI joint fusion which she does state gave her significant relief initially but now she is back to having pain about the lateral aspect of the hip.  Of note she is status post left total hip arthroplasty done while she was in her 47s.  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  She has had multiple injections into the hip which appear to sound lateral in nature.  The first 1 that was done gave her quite significant relief.  She has had physical therapy of the right hip without any improvement.  Overall she is here today as a referral from Dr. Lucienne Ryder for ongoing discussion of her hip.       PMH/PSH/Family History/Social History/Meds/Allergies:         Past Medical History:  Diagnosis Date   Allergy 1992   Anxiety     Arthritis      "right little finger; base of thumb left hand; spine" (07/07/2017)   Asthma, chronic 11/27/2015   Cataract 2021   Chronic back pain     Coronary artery disease      4/19 PCI/DEx2 to LAD, and PCI/DES x1 to RCA, normal EF   Depression     Diabetes mellitus without complication (HCC) 2020   Diverticulitis of colon 12/21/2015    Colonoscopy every 5 years/digestive health.    GERD (gastroesophageal reflux disease) 11/27/2015   History of blood transfusion      "related to spinal fusions"   Hyperlipidemia LDL goal <70 11/29/2018   Hypertension     Hypothyroidism     Idiopathic scoliosis 01/22/2016   Osteoporosis 11/27/2015    On prolia .    Pneumonia 2016   Pneumothorax 06/26/2017   Pre-diabetes     Sleep apnea 2020   Thyroid  disease               Past Surgical History:   Procedure Laterality Date   ABDOMINAL HYSTERECTOMY       bladder mesh        S/P bladder sling"   COLON SURGERY   2007   CORONARY PRESSURE/FFR STUDY N/A 07/07/2017    Procedure: INTRAVASCULAR PRESSURE WIRE/FFR STUDY;  Surgeon: Arleen Lacer, MD;  Location: [facility] INVASIVE CV LAB;  Service: Cardiovascular;  Laterality: N/A;   CORONARY STENT INTERVENTION N/A 07/07/2017    Procedure: CORONARY STENT INTERVENTION;  Surgeon: Arleen Lacer, MD;  Location: [facility] INVASIVE CV LAB;  Service: Cardiovascular;  Laterality: N/A;   ELBOW SURGERY Right      "dislocation"   INCONTINENCE SURGERY        "didn't work"   JOINT REPLACEMENT       LEFT HEART CATH AND CORONARY ANGIOGRAPHY N/A 07/07/2017    Procedure: LEFT HEART CATH AND CORONARY ANGIOGRAPHY;  Surgeon: Arleen Lacer, MD;  Location: [facility] INVASIVE CV LAB;  Service: Cardiovascular;  Laterality: N/A;   SMALL INTESTINE SURGERY       SPINAL FUSION   ~ 1970 X 3    "below neck - lower back"  TONSILLECTOMY       TOTAL HIP ARTHROPLASTY Left          Social History         Socioeconomic History   Marital status: Married      Spouse name: Marylou Sobers   Number of children: 1   Years of education: 16   Highest education level: Bachelor's degree (e.g., BA, AB, BS)  Occupational History   Occupation: Retired  Tobacco Use   Smoking status: Never   Smokeless tobacco: Never  Vaping Use   Vaping status: Never Used  Substance and Sexual Activity   Alcohol use: Not Currently      Comment: Maybe 3 drinks/year   Drug use: No   Sexual activity: Not Currently      Birth control/protection: None  Other Topics Concern   Not on file  Social History Narrative    Lives with her husband. She has one child. She enjoys playing games on the computer.    Social Drivers of Acupuncturist Strain: Low Risk  (03/03/2023)    Overall Financial Resource Strain (CARDIA)     Difficulty of Paying Living Expenses: Not hard at all  Food Insecurity: No  Food Insecurity (03/03/2023)    Hunger Vital Sign     Worried About Running Out of Food in the Last Year: Never true     Ran Out of Food in the Last Year: Never true  Transportation Needs: No Transportation Needs (03/03/2023)    PRAPARE - Therapist, art (Medical): No     Lack of Transportation (Non-Medical): No  Physical Activity: Inactive (03/03/2023)    Exercise Vital Sign     Days of Exercise per Week: 0 days     Minutes of Exercise per Session: 0 min  Stress: No Stress Concern Present (03/03/2023)    [employer] of Occupational Health - Occupational Stress Questionnaire     Feeling of Stress : Only a little  Social Connections: Moderately Integrated (03/03/2023)    Social Connection and Isolation Panel [NHANES]     Frequency of Communication with Friends and Family: Once a week     Frequency of Social Gatherings with Friends and Family: Once a week     Attends Religious Services: More than 4 times per year     Active Member of [employer] or Organizations: Yes     Attends Banker Meetings: Never     Marital Status: Married         Family History  Problem Relation Age of Onset   Cancer Mother          lung   Depression Mother     Early death Mother     Heart attack Father     Hyperlipidemia Father     Hypertension Father     Arthritis Father     Heart disease Father     Diabetes Maternal Aunt     Hyperlipidemia Paternal Aunt     COPD Paternal Aunt     Hypertension Paternal Aunt     Stroke Paternal Aunt          Allergies       Allergies  Allergen Reactions   Hydromorphone Rash   Levofloxacin Other (See Comments)      Hallucinations   Meperidine Nausea And Vomiting and Other (See Comments)      Also "doesn't work well for my pain"  Broccoli [Brassica Oleracea] Nausea Only and Other (See Comments)      Causes stomach pain also   Adhesive [Tape] Rash            Current Outpatient Medications  Medication Sig Dispense  Refill   acetaminophen  (TYLENOL ) 500 MG tablet Take 1 tablet (500 mg total) by mouth every 8 (eight) hours for 10 days. 30 tablet 0   aspirin  EC 325 MG tablet Take 1 tablet (325 mg total) by mouth daily. 14 tablet 0   oxyCODONE  (ROXICODONE ) 5 MG immediate release tablet Take 1 tablet (5 mg total) by mouth every 4 (four) hours as needed for severe pain (pain score 7-10) or breakthrough pain. 30 tablet 0   albuterol  (VENTOLIN  HFA) 108 (90 Base) MCG/ACT inhaler INHALE 1-2 PUFFS INTO THE LUNGS EVERY 4 HOURS AS NEEDED FOR WHEEZING OR SHORTNESS OF BREATH. 18 g 2   aspirin  EC 81 MG tablet Take 81 mg by mouth at bedtime.       atorvastatin  (LIPITOR) 20 MG tablet Take 1 tablet (20 mg total) by mouth daily. 90 tablet 2   busPIRone  (BUSPAR ) 15 MG tablet Take 60 mg by mouth daily.       calcium -vitamin D  (OSCAL WITH D) 500-200 MG-UNIT tablet Take 1 tablet by mouth daily.       clonazePAM  (KLONOPIN ) 0.5 MG tablet Take 0.25 mg by mouth at bedtime.        denosumab  (PROLIA ) 60 MG/ML SOLN injection Inject 60 mg into the skin every 6 (six) months. Administer in upper arm, thigh, or abdomen       desvenlafaxine  (PRISTIQ ) 100 MG 24 hr tablet Take 100 mg by mouth in the morning.       Empagliflozin-metFORMIN  HCl ER (SYNJARDY  XR) 25-1000 MG TB24 Take 1 tablet by mouth daily. 90 tablet 1   eszopiclone (LUNESTA) 2 MG TABS tablet Take 2 mg by mouth at bedtime.       fluticasone  (FLONASE ) 50 MCG/ACT nasal spray SPRAY 2 SPRAYS INTO EACH NOSTRIL EVERY DAY 48 mL 1   gabapentin  (NEURONTIN ) 300 MG capsule TAKE 1 CAPSULE IN THE MORNING AND 2 CAPSULES AT BEDTIME 270 capsule 3   ipratropium-albuterol  (DUONEB) 0.5-2.5 (3) MG/3ML SOLN Take 3 mLs by nebulization every 2 (two) hours as needed (wheeze, SOB). 60 mL 0   ketoconazole (NIZORAL) 2 % shampoo Apply topically.       levothyroxine  (SYNTHROID ) 137 MCG tablet TAKE 1 TABLET ONCE A WEEK THEN 1/2 TABLET ALL OTHER DAYS AS DIRECTED 52 tablet 3   Menthol, Topical Analgesic, (MINERAL  ICE EX) Apply 1 application to affected sites one to two times a day as needed (for pain)       metoprolol  succinate (TOPROL -XL) 25 MG 24 hr tablet TAKE 1 TABLET (25 MG TOTAL) BY MOUTH DAILY. 90 tablet 2   nitroGLYCERIN  (NITROSTAT ) 0.4 MG SL tablet DISSOLVE 1 TAB UNDER THE TONGUE FOR CHEST PAIN EVERY 5 MINUTES UP TO 3 DOSES IF PAIN PERSIST CALL 911/SEEK MEDICAL HELP 75 tablet 2   Omega-3 Fatty Acids (FISH OIL PO) Take 1 capsule by mouth daily.        oxybutynin  (DITROPAN -XL) 5 MG 24 hr tablet TAKE 1 TABLET EVERY DAY 90 tablet 3   pantoprazole  (PROTONIX ) 40 MG tablet TAKE 1 TABLET EVERY DAY 90 tablet 3   REXULTI 1 MG TABS Take 1 tablet by mouth daily.        tolterodine (DETROL LA) 2 MG 24 hr capsule Take 2 mg  by mouth in the morning.       trazodone  (DESYREL ) 300 MG tablet Take 300 mg by mouth at bedtime.                   Current Facility-Administered Medications  Medication Dose Route Frequency Provider Last Rate Last Admin   [START ON 07/08/2023] denosumab  (PROLIA ) injection 60 mg  60 mg Subcutaneous Once Breeback, Jade L, PA-C          Imaging Results (Last 48 hours)  No results found.     Review of Systems:   A ROS was performed including pertinent positives and negatives as documented in the HPI.   Physical Exam :   Constitutional: NAD and appears stated age Neurological: Alert and oriented Psych: Appropriate affect and cooperative There were no vitals taken for this visit.    Comprehensive Musculoskeletal Exam:     Right hip with tenderness in the FADIR position.  She has 30 degrees internal/external rotation without any clicking or popping but pain with any internal rotation.  She does have no pain with resisted abduction of the right hip.  There is pain with direct palpation of the trochanteric.     Imaging:   Xray (3 views right hip): There is significant acetabular over coverage consistent with pincer lesion   MRI (right hip): Pincer lesion and ossification of the  labrum with well-preserved femoral acetabular joint.  No obvious gluteus tearing     I personally reviewed and interpreted the radiographs.     Assessment and Plan:   68 y.o. female with complex right hip pain.  I did discuss that ultimately her x-rays today do show evidence of pincer impingement although she does not have a flagrantly positive FADIR maneuver and she is having some pain laterally as well.  I discussed that at this time in order to discern the true etiology of her underlying pain I would recommend diagnostic injection starting with the hip joint itself.  She overall got 100% relief from her injection.  As result I do believe she would ultimately benefit from right hip arthroscopy and pincer debridement.  I discussed the risks and limitations as well as associated rehab.  She would like to proceed with this.   -Plan for right hip arthroscopy with pincer debridement       After a lengthy discussion of treatment options, including risks, benefits, alternatives, complications of surgical and nonsurgical conservative options, the patient elected surgical repair.    The patient  is aware of the material risks  and complications including, but not limited to injury to adjacent structures, neurovascular injury, infection, numbness, bleeding, implant failure, thermal burns, stiffness, persistent pain, failure to heal, disease transmission from allograft, need for further surgery, dislocation, anesthetic risks, blood clots, risks of death,and others. The probabilities of surgical success and failure discussed with patient given their particular co-morbidities.The time and nature of expected rehabilitation and recovery was discussed.The patient's questions were all answered preoperatively.  No barriers to understanding were noted. I explained the natural history of the disease process and Rx rationale.  I explained to the patient what I considered to be reasonable expectations given their  personal situation.  The final treatment plan was arrived at through a shared patient decision making process model.       I personally saw and evaluated the patient, and participated in the management and treatment plan.   Wilhelmenia Harada, MD Attending Physician, Orthopedic Surgery

## 2023-07-30 NOTE — Op Note (Signed)
 Date of Surgery: 07/30/2023  INDICATIONS: Ms. 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  The risk and benefits of the procedure were discussed in detail and documented in the pre-operative evaluation.   PREOPERATIVE DIAGNOSIS: 1.  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  PROCEDURE: 1. Right hip pince debridement with labral repair  SURGEON: Carmina Chris MD  ASSISTANT: Deon Flatter, ATC  ANESTHESIA:  general  IV FLUIDS AND URINE: See anesthesia record.  ANTIBIOTICS: Ancef   ESTIMATED BLOOD LOSS: 5 mL.  IMPLANTS:  Implant Name Type Inv. Item Serial No. Manufacturer Lot No. LRB No. Used Action  ANCHOR SUT 1.4 FLEX - [id number] Anchor ANCHOR SUT 1.4 FLEX  [employer] [id number] Right 2 Implanted    DRAINS: None  CULTURES: None  COMPLICATIONS: none  DESCRIPTION OF PROCEDURE:   Cartilage Grade 1 femoral chondral loss focally near the pincer and intact acetabular cartilage   Labrum Degenerative appearing with large pincer lesion and unstable os   Boundaries of labral tear Convention (3 o'clock anterior, 9 o'clock posterior) Anterior boundary: 3 o'clock Posterior boundary: 1 o'clock   OPERATIVE REPORT:  The patient was brought to the operating room, placed supine on the operating table, and bony prominences were padded.  The traction boots were applied with padding to ensure that safe traction could be applied through the feet.  The contralateral limb was abducted maximally and light traction was applied.  The operative leg was brought into neutral position.  The flouroscopic c-arm was brought between the legs for an AP image.  The patient was prepped and draped in a sterile fashion.  Time-out was performed and landmarks were identified. Traction was obtained and care was taken to ensure the least amount of force necessary to allow safe access to the joint of 8-27mm.  This was checked with  fluoroscopy.    Next we placed an anterolateral portal under the assistance of fluoroscopy.  First, fluoroscopy was used to estimate the trajectory and starting point.  A 5mm incision with a #11 blade was made and a straight hemostat was used to dilate the portal through the appropriate tract.  We then placed a 14-gauge hypodermic needle with careful technique to be as close to the femoral head as possible and parallel to the sorcele to ensure no iatrogenic damage to the labrum.  This released the negative pressure environment and the amount of traction was adjusted to maintain the 8-64mm of distraction.  A nitinol wire was placed through the needle and flouroscopy was used to ensure it extended to the medial wall of the acetabulum.  The Flowport from [employer] was placed over the wire and the nitinol wire was retracted to just inside the capsule during insertion of the dilator and cannula to minimize the risk of breakage. The arthroscope was placed next and we visualized the anterior triangle.     We then placed the anterior portal under direct visualization using the technique described above.  This was safely placed as well without damage to the labrum or femoral head.  We then switched our arthroscope to the anterior portal to ensure we were not through the labrum - we were safely through the capsule only.  At this time the bur was introduced and used to debride the unstable os to a smaller lesion.  This was then removed with a grasper.  We identified the anterior inferior iliac spine proximally, the psoas tendon medially  and the rectus tendon laterally as landmarks.  We then proceeded with a diagnostic arthroscopy - the results can be found in the findings section above.    We then used the 50 degree hip specific radiofrequency device from [employer]. and a 4mm shaver to clear the superior acetabulum and expose the subspinous region.  Next we exposed the acetabular rim leaving the  chondral labral junction intact.  Working from both portals, the acetabular rim/subspinous region was reshaped with 5.5 mm bur consistent with the preoperative three-dimensional imaging.   When adequate reshaping was obtained we then proceeded with the labral repair. We placed 2 anchors at the 1:00 and 2:30 positions. The sutures were passed using the crescent Nanopass from [employer].  This resulted in anatomic labral repair.  We debrided the loose cartilage at the rim and residual degenerative labral tissue.  Traction was let down with total traction time of 45 minutes.     Finally, we performed a complete capsular closure with tape suture.  She was replaced in the anterior and posterior limb of the reported capsulotomy with excellent apposition. We then removed the arthroscope and closed the incisions with 3-0 nylon simple stitches.  A sterile dressing was applied..  The patient was awakened from anesthesia and transferred to PACU in stable condition. Postoperative care includes:       POSTOPERATIVE PLAN:    Weight bearing as tolerated right leg Formal physical therapy will begin this week. ASA 325 Daily for DVT prophylaxis    Carmina Chris, MD 3:04 PM

## 2023-08-03 ENCOUNTER — Encounter (HOSPITAL_BASED_OUTPATIENT_CLINIC_OR_DEPARTMENT_OTHER): Payer: Self-pay | Admitting: Orthopaedic Surgery

## 2023-08-03 ENCOUNTER — Telehealth: Payer: Self-pay | Admitting: Orthopaedic Surgery

## 2023-08-03 NOTE — Telephone Encounter (Signed)
 Patient called, she would like to know how many days she should wear the stockings? Also how often and time for the ice machine? How long does she keep the bandage on? Her cb# [phone]

## 2023-08-04 ENCOUNTER — Encounter (HOSPITAL_BASED_OUTPATIENT_CLINIC_OR_DEPARTMENT_OTHER): Payer: Self-pay

## 2023-08-05 ENCOUNTER — Other Ambulatory Visit: Payer: Self-pay

## 2023-08-05 ENCOUNTER — Ambulatory Visit: Attending: Orthopaedic Surgery | Admitting: Rehabilitative and Restorative Service Providers"

## 2023-08-05 ENCOUNTER — Encounter: Payer: Self-pay | Admitting: Rehabilitative and Restorative Service Providers"

## 2023-08-05 DIAGNOSIS — M25541 Pain in joints of right hand: Secondary | ICD-10-CM | POA: Diagnosis not present

## 2023-08-05 DIAGNOSIS — R2689 Other abnormalities of gait and mobility: Secondary | ICD-10-CM | POA: Diagnosis not present

## 2023-08-05 DIAGNOSIS — M6281 Muscle weakness (generalized): Secondary | ICD-10-CM | POA: Diagnosis not present

## 2023-08-05 DIAGNOSIS — M25851 Other specified joint disorders, right hip: Secondary | ICD-10-CM | POA: Insufficient documentation

## 2023-08-05 DIAGNOSIS — M25551 Pain in right hip: Secondary | ICD-10-CM | POA: Insufficient documentation

## 2023-08-05 NOTE — Therapy (Signed)
 OUTPATIENT PHYSICAL THERAPY LOWER EXTREMITY EVALUATION   Patient Name: Leslie Roberson MRN: [id number] DOB:September 04, 1955, 68 y.o., female Today's Date: 08/05/2023  END OF SESSION:  PT End of Session - 08/05/23 1148     Visit Number 1    Number of Visits 16    Date for PT Re-Evaluation 10/04/23    Authorization Type [employer]    PT Start Time 1150    PT Stop Time 1230    PT Time Calculation (min) 40 min    Activity Tolerance Patient tolerated treatment well    Behavior During Therapy WFL for tasks assessed/performed             Past Medical History:  Diagnosis Date   Allergy 1992   Anxiety    Arthritis    "right little finger; base of thumb left hand; spine" (07/07/2017)   Asthma, chronic 11/27/2015   Cataract 2021   Chronic back pain    Coronary artery disease    4/19 PCI/DEx2 to LAD, and PCI/DES x1 to RCA, normal EF   Depression    Diabetes mellitus without complication (HCC) 2020   Diverticulitis of colon 12/21/2015   Colonoscopy every 5 years/digestive health.    GERD (gastroesophageal reflux disease) 11/27/2015   History of blood transfusion    "related to spinal fusions"   Hyperlipidemia LDL goal <70 11/29/2018   Hypertension    Hypothyroidism    Idiopathic scoliosis 01/22/2016   Osteoporosis 11/27/2015   On prolia .    Pneumonia 2016   Pneumothorax 06/26/2017   Pre-diabetes    Sleep apnea 2020   does not use CPAP   Thyroid  disease    Past Surgical History:  Procedure Laterality Date   ABDOMINAL HYSTERECTOMY     bladder mesh     S/P bladder sling"   COLON SURGERY  2007   CORONARY PRESSURE/FFR STUDY N/A 07/07/2017   Procedure: INTRAVASCULAR PRESSURE WIRE/FFR STUDY;  Surgeon: Arleen Lacer, MD;  Location: [facility] INVASIVE CV LAB;  Service: Cardiovascular;  Laterality: N/A;   CORONARY STENT INTERVENTION N/A 07/07/2017   Procedure: CORONARY STENT INTERVENTION;  Surgeon: Arleen Lacer, MD;  Location: [facility] INVASIVE CV LAB;  Service:  Cardiovascular;  Laterality: N/A;   ELBOW SURGERY Right    "dislocation"   HIP ARTHROSCOPY Right 07/30/2023   Procedure: ARTHROSCOPY HIP;  Surgeon: Wilhelmenia Harada, MD;  Location: [facility];  Service: Orthopedics;  Laterality: Right;  RIGHT HIP ARTHROSCOPY WITH PINCER DEBRIDEMENT   INCONTINENCE SURGERY     "didn't work"   JOINT REPLACEMENT     LEFT HEART CATH AND CORONARY ANGIOGRAPHY N/A 07/07/2017   Procedure: LEFT HEART CATH AND CORONARY ANGIOGRAPHY;  Surgeon: Arleen Lacer, MD;  Location: [facility] INVASIVE CV LAB;  Service: Cardiovascular;  Laterality: N/A;   SMALL INTESTINE SURGERY     SPINAL FUSION  ~ 1970 X 3   "below neck - lower back"   TONSILLECTOMY     TOTAL HIP ARTHROPLASTY Left    Patient Active Problem List   Diagnosis Date Noted   Right hip impingement syndrome 07/30/2023   Chest congestion 06/03/2023   Toe blister without infection, left, initial encounter 06/02/2023   Nasal congestion 12/31/2022   Fever and chills 12/26/2022   Routine health maintenance 09/08/2022   Need for antibiotic prophylaxis for dental procedure 08/05/2022   Sinobronchitis 07/29/2022   Community acquired pneumonia of left lower lobe of lung 12/11/2021   Closed dislocation of metacarpophalangeal joint of right thumb 11/18/2021  Mass of right axilla 05/15/2021   Ear fullness, left 04/08/2021   Bilateral hearing loss 04/05/2021   Impacted cerumen, left ear 04/05/2021   Controlled type 2 diabetes mellitus with complication, without long-term current use of insulin (HCC) 11/16/2020   Right hand pain 11/16/2020   Fall 11/16/2020   Cough 05/18/2020   Paroxysmal nocturnal dyspnea 05/14/2020   Orthopnea 05/14/2020   Acute conjunctivitis of both eyes 01/23/2020   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   Ingrown right greater toenail 07/19/2019   Status post hip replacement, left 07/19/2019   Left hip pain 07/19/2019   OSA on CPAP 05/20/2019   Snoring 05/02/2019    Non-restorative sleep 05/02/2019   Migraine without aura and without status migrainosus, not intractable 02/08/2019   OAB (overactive bladder) 02/08/2019   Diabetes mellitus without complication (HCC) 02/07/2019   Hyperlipidemia LDL goal <70 11/29/2018   DJD (degenerative joint disease), lumbosacral 11/29/2018   Pre-operative clearance 11/29/2018   Nasal dryness 02/06/2018   Arthralgia 02/05/2018   Rhinorrhea 01/04/2018   Excessive sweating 09/22/2017   Tongue mass 08/14/2017   Angina, class III (HCC) 07/07/2017   S/P cardiac cath 07/07/2017   CAD S/P percutaneous coronary angioplasty    Pneumothorax 06/25/2017   Chest tightness 05/24/2017   Itchy scalp 05/24/2017   Right leg pain 02/06/2017   Chronic tension-type headache, intractable 02/05/2017   Prediabetes 02/05/2017   Lump in throat 01/27/2017   Dysphagia 01/27/2017   No energy 09/15/2016   Bad taste in mouth 07/26/2016   Paresthesia 04/03/2016   Facet hypertrophy of lumbosacral region 04/03/2016   Chronic back pain 04/03/2016   Right knee pain 02/19/2016   Rosacea 02/04/2016   Vaginal atrophy 01/31/2016   Idiopathic scoliosis 01/22/2016   Hip bursitis 12/25/2015   Biceps tendonitis on right 12/25/2015   Diverticulitis of colon 12/21/2015   GERD (gastroesophageal reflux disease) 11/27/2015   Osteoporosis 11/27/2015   GAD (generalized anxiety disorder) 11/27/2015   MDD (major depressive disorder), recurrent, in full remission (HCC) 11/27/2015   Chronic dryness of both eyes 11/27/2015   Acquired hypothyroidism 11/27/2015   Asthma, chronic 11/27/2015   Insomnia 11/27/2015   Seborrheic keratoses 11/27/2015   CAD in native artery 11/27/2015    PCP: Sandy Crumb, PA REFERRING PROVIDER: Wilhelmenia Harada, MD REFERRING DIAG:  Diagnosis  M25.851 (ICD-10-CM) - Right hip impingement syndrome    THERAPY DIAG:  Muscle weakness (generalized) - Plan: PT PLAN OF CARE CERT/RE-CERT  Pain in right hip  Other abnormalities  of gait and mobility  Rationale for Evaluation and Treatment: Rehabilitation  ONSET DATE: Date of Surgery: 07/30/2023   SUBJECTIVE:  SUBJECTIVE STATEMENT: The patient has a multi-year h/o R hip pain-- and underwent R hip arthroscopy with pincer debridement and labral repair. She presents s/p surgery with a standard walker (using the one she had at home) so she is carrying the device. She does not have a brace for night (verified with Vance Gell from Dr. Verline Glow office  to ensure this is correct). Patient has pain that is worse when laying down.   UPCOMING PROCEDURE: patient is having botox in her bladder and demonstrates she will need to be able to lift her R leg minimally during procedure.   PERTINENT HISTORY: Diabetes, HTN, hearing loss, CAD PAIN:  Are you having pain? Yes: NPRS scale: 6/10 Pain location: R hip front/back Pain description: soreness, aching Aggravating factors: laying down-- hard to get comfortable Relieving factors: unsure  PRECAUTIONS: Other: hip arthroscopic procedure  WEIGHT BEARING RESTRICTIONS: Yes WBAT  FALLS:  Has patient fallen in last 6 months? No  LIVING ENVIRONMENT: Lives with: lives with their spouse Lives in: House/apartment Stairs:  one step entry- level indoors Has following equipment at home:  standard walker-- carries the walker as she moves throughut clinic  PLOF: Independent  PATIENT GOALS: reduce hip pain, return to daily activities  NEXT MD VISIT: 08/14/23  OBJECTIVE:  Note: Objective measures were completed at Evaluation unless otherwise noted.  PATIENT SURVEYS:  LEFS 13.8%   COGNITION: Overall cognitive status: Within functional limits for tasks assessed     SENSATION: WFL  DRESSING:   patient had allergy to waterproof bandage-- she has coban on and therefore is not showering this wee  POSTURE:  h/o scoliosis with R rib hump in thoracic spine, C curve concave to the L, and hip asymmetry-- L hip elevated as compared to the  R hip   LOWER EXTREMITY ROM: *when patient moves into supine, she goes into end range ER and has >60 deg bilat. PT educated her that we want to prop with a pillow at night to prevent excessive hip ER  Passive ROM Right eval Left eval  Hip flexion 90   Hip extension 0   Hip abduction 30   Hip adduction    Hip internal rotation    Hip external rotation *excessive Goes into 60+ deg in supine-- PT educated to limit to 20 degrees using pillow prop    Knee flexion    Knee extension    Ankle dorsiflexion    Ankle plantarflexion    Ankle inversion    Ankle eversion     (Blank rows = not tested)  LOWER EXTREMITY MMT: Deferred due to post surgical status MMT Right eval Left eval  Hip flexion    Hip extension    Hip abduction    Hip adduction    Hip internal rotation    Hip external rotation    Knee flexion    Knee extension    Ankle dorsiflexion    Ankle plantarflexion    Ankle inversion    Ankle eversion     (Blank rows = not tested)  FUNCTIONAL TESTS:  Sit<>stand independently to device without UE support  GAIT: Distance walked: 50 ft Assistive device utilized:  standard walker Level of assistance: Modified independence Comments: 2.11 ft/sec *PT switched patient out to RW so she is not carrying the standard walker. She is WBAT                                                                                                                               [facility] Adult PT Treatment:                                                DATE:  08/05/23 Therapeutic Exercise: Supine Glut set x 10 reps Quad set x 10 reps PROM hip flexion PROM hip IR/ER Transverse abdominus activation x 5 reps Diaphragmatic breathing x 5 reps Gait: With SW-- adjusted to lower height  PATIENT EDUCATION:  Education details: HEP, precautions for ROM (especially ER), WBAT-- switched patient's device to borrow a RW from clinic b/c she was carrying SW Person educated: Patient Education method:  Programmer, multimedia, Facilities manager, and Handouts Education comprehension: verbalized understanding, returned demonstration, and needs further education  HOME EXERCISE PROGRAM: Access Code: [email or web address] URL: [email or web address] Date: 08/05/2023 Prepared by: Trygve Gage  Program Notes Avoid sitting for more than 20-30 minutes at a time.  Exercises - Supine Gluteal Sets  - 2 x daily - 7 x weekly - 1 sets - 10 reps - 5 seconds hold - Supine Quadricep Sets  - 2 x daily - 7 x weekly - 1 sets - 10 reps - 5 seconds hold - Supine Diaphragmatic Breathing  - 2 x daily - 7 x weekly - 1 sets - 5 reps - Supine Transversus Abdominis Bracing - Hands on Stomach  - 2 x daily - 7 x weekly - 1 sets - 5 reps - 5 seconds hold - Lying Prone  - 3-4 x daily - 7 x weekly - 1 sets - 1 reps - 15 minutes hold  ASSESSMENT:  CLINICAL IMPRESSION: Patient is a 68 y.o. female who was seen today for physical therapy evaluation and treatment S/P R hip arthroscopic surgery. She presents with impairments in hip ROM, muscle strength, functional mobility, gait speed, and balance. She also has h/o scoliosis which leads to asymmetry. Patient presents today with excessive ROM into ER and PT reviewed limitations of ROM at this time. Patient will benefit from PT services to improve strength, progress ambulation, and return to ADLs and IADLs.  OBJECTIVE IMPAIRMENTS: Abnormal gait, decreased activity tolerance, decreased balance, decreased endurance, decreased ROM, decreased strength, hypomobility, increased fascial restrictions, impaired flexibility, postural dysfunction, and pain.   ACTIVITY LIMITATIONS: lifting, bending, squatting, sleeping, stairs, bed mobility, and locomotion level  PARTICIPATION LIMITATIONS: cleaning, driving, and community activity  PERSONAL FACTORS: 1-2 comorbidities: scoliosis, diabetes, HTN  are also affecting patient's functional outcome.   REHAB POTENTIAL: Good  CLINICAL DECISION MAKING:  Evolving/moderate complexity  EVALUATION COMPLEXITY: Moderate   GOALS: Goals reviewed with patient? Yes  SHORT TERM GOALS: Target date: 09/04/23     Patient will be independent and compliant with initial HEP.    Baseline: issued at eval  Goal status: INITIAL   2.  Patient will demonstrate step through gait pattern without AD.  Baseline: step to with crutches Goal status: INITIAL   3.  Patient will demonstrate at least 90 degrees of active hip flexion ROM to improve ability to complete squatting activity.  Baseline: see PROM above  Goal status: INITIAL   4.  Patient will be able to ascend/descend stairs with reciprocal pattern. Baseline: step to with crutches  Goal status: INITIAL     LONG TERM GOALS: Target date: 10/04/23     Patient will score >/= 40% on the LEFS (MCID is 9) to signify clinically meaningful improvement in functional abilities.    Baseline: 13.8%  Goal status: INITIAL   2.  Patient will demonstrate at least 4/5 R hip strength to improve stability with walking/standing activity.  Baseline: deferred  Goal status: INITIAL   3.  Patient will demonstrate functional and pain free R hip ROM.  Baseline: see PROM above  Goal status: INITIAL  4.  Patient will be able to ambulate community distances without AD without hip pain.  Baseline: see gait above  Goal status: INITIAL   5.  Patient will be independent with advanced home program to progress/maintain current level of function.  Baseline: initial HEP issued  Goal status: INITIAL   6.  Patient will maintain SLS on the RLE for at least 10 seconds to improve gait stability.  Baseline: unable  Goal status: INITIAL   PLAN:  PT FREQUENCY: 2x/week  PT DURATION: 8 weeks  PLANNED INTERVENTIONS: 97164- PT Re-evaluation, 97110-Therapeutic exercises, 97530- Therapeutic activity, 97112- Neuromuscular re-education, 97535- Self Care, 16109- Manual therapy, [phone]- Gait training, Patient/Family education, Balance  training, Taping, Dry Needling, Cryotherapy, and Moist heat  PLAN FOR NEXT SESSION: check HEP; progress via protocol   Delron Comer, PT 08/05/2023, 3:10 PM

## 2023-08-10 ENCOUNTER — Encounter (HOSPITAL_BASED_OUTPATIENT_CLINIC_OR_DEPARTMENT_OTHER): Payer: Self-pay | Admitting: Orthopaedic Surgery

## 2023-08-12 ENCOUNTER — Ambulatory Visit: Admitting: Rehabilitative and Restorative Service Providers"

## 2023-08-12 ENCOUNTER — Encounter: Payer: Self-pay | Admitting: Rehabilitative and Restorative Service Providers"

## 2023-08-12 DIAGNOSIS — M25551 Pain in right hip: Secondary | ICD-10-CM

## 2023-08-12 DIAGNOSIS — R2689 Other abnormalities of gait and mobility: Secondary | ICD-10-CM

## 2023-08-12 DIAGNOSIS — M6281 Muscle weakness (generalized): Secondary | ICD-10-CM

## 2023-08-12 DIAGNOSIS — N3281 Overactive bladder: Secondary | ICD-10-CM | POA: Diagnosis not present

## 2023-08-12 NOTE — Therapy (Signed)
 OUTPATIENT PHYSICAL THERAPY LOWER EXTREMITY TREATMENT  Patient Name: Leslie Roberson MRN: [id number] DOB:Nov 03, 1955, 68 y.o., female Today's Date: 08/12/2023  END OF SESSION:  PT End of Session - 08/12/23 0932     Visit Number 2    Number of Visits 16    Date for PT Re-Evaluation 10/04/23    Authorization Type [employer]    PT Start Time 0932    PT Stop Time 1014    PT Time Calculation (min) 42 min    Activity Tolerance Patient tolerated treatment well    Behavior During Therapy WFL for tasks assessed/performed             Past Medical History:  Diagnosis Date   Allergy 1992   Anxiety    Arthritis    "right little finger; base of thumb left hand; spine" (07/07/2017)   Asthma, chronic 11/27/2015   Cataract 2021   Chronic back pain    Coronary artery disease    4/19 PCI/DEx2 to LAD, and PCI/DES x1 to RCA, normal EF   Depression    Diabetes mellitus without complication (HCC) 2020   Diverticulitis of colon 12/21/2015   Colonoscopy every 5 years/digestive health.    GERD (gastroesophageal reflux disease) 11/27/2015   History of blood transfusion    "related to spinal fusions"   Hyperlipidemia LDL goal <70 11/29/2018   Hypertension    Hypothyroidism    Idiopathic scoliosis 01/22/2016   Osteoporosis 11/27/2015   On prolia .    Pneumonia 2016   Pneumothorax 06/26/2017   Pre-diabetes    Sleep apnea 2020   does not use CPAP   Thyroid  disease    Past Surgical History:  Procedure Laterality Date   ABDOMINAL HYSTERECTOMY     bladder mesh     S/P bladder sling"   COLON SURGERY  2007   CORONARY PRESSURE/FFR STUDY N/A 07/07/2017   Procedure: INTRAVASCULAR PRESSURE WIRE/FFR STUDY;  Surgeon: Arleen Lacer, MD;  Location: [facility] INVASIVE CV LAB;  Service: Cardiovascular;  Laterality: N/A;   CORONARY STENT INTERVENTION N/A 07/07/2017   Procedure: CORONARY STENT INTERVENTION;  Surgeon: Arleen Lacer, MD;  Location: [facility] INVASIVE CV LAB;  Service: Cardiovascular;   Laterality: N/A;   ELBOW SURGERY Right    "dislocation"   HIP ARTHROSCOPY Right 07/30/2023   Procedure: ARTHROSCOPY HIP;  Surgeon: Wilhelmenia Harada, MD;  Location: [facility];  Service: Orthopedics;  Laterality: Right;  RIGHT HIP ARTHROSCOPY WITH PINCER DEBRIDEMENT   INCONTINENCE SURGERY     "didn't work"   JOINT REPLACEMENT     LEFT HEART CATH AND CORONARY ANGIOGRAPHY N/A 07/07/2017   Procedure: LEFT HEART CATH AND CORONARY ANGIOGRAPHY;  Surgeon: Arleen Lacer, MD;  Location: [facility] INVASIVE CV LAB;  Service: Cardiovascular;  Laterality: N/A;   SMALL INTESTINE SURGERY     SPINAL FUSION  ~ 1970 X 3   "below neck - lower back"   TONSILLECTOMY     TOTAL HIP ARTHROPLASTY Left    Patient Active Problem List   Diagnosis Date Noted   Right hip impingement syndrome 07/30/2023   Chest congestion 06/03/2023   Toe blister without infection, left, initial encounter 06/02/2023   Nasal congestion 12/31/2022   Fever and chills 12/26/2022   Routine health maintenance 09/08/2022   Need for antibiotic prophylaxis for dental procedure 08/05/2022   Sinobronchitis 07/29/2022   Community acquired pneumonia of left lower lobe of lung 12/11/2021   Closed dislocation of metacarpophalangeal joint of right thumb 11/18/2021  Mass of right axilla 05/15/2021   Ear fullness, left 04/08/2021   Bilateral hearing loss 04/05/2021   Impacted cerumen, left ear 04/05/2021   Controlled type 2 diabetes mellitus with complication, without long-term current use of insulin (HCC) 11/16/2020   Right hand pain 11/16/2020   Fall 11/16/2020   Cough 05/18/2020   Paroxysmal nocturnal dyspnea 05/14/2020   Orthopnea 05/14/2020   Acute conjunctivitis of both eyes 01/23/2020   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   Ingrown right greater toenail 07/19/2019   Status post hip replacement, left 07/19/2019   Left hip pain 07/19/2019   OSA on CPAP 05/20/2019   Snoring 05/02/2019   Non-restorative sleep  05/02/2019   Migraine without aura and without status migrainosus, not intractable 02/08/2019   OAB (overactive bladder) 02/08/2019   Diabetes mellitus without complication (HCC) 02/07/2019   Hyperlipidemia LDL goal <70 11/29/2018   DJD (degenerative joint disease), lumbosacral 11/29/2018   Pre-operative clearance 11/29/2018   Nasal dryness 02/06/2018   Arthralgia 02/05/2018   Rhinorrhea 01/04/2018   Excessive sweating 09/22/2017   Tongue mass 08/14/2017   Angina, class III (HCC) 07/07/2017   S/P cardiac cath 07/07/2017   CAD S/P percutaneous coronary angioplasty    Pneumothorax 06/25/2017   Chest tightness 05/24/2017   Itchy scalp 05/24/2017   Right leg pain 02/06/2017   Chronic tension-type headache, intractable 02/05/2017   Prediabetes 02/05/2017   Lump in throat 01/27/2017   Dysphagia 01/27/2017   No energy 09/15/2016   Bad taste in mouth 07/26/2016   Paresthesia 04/03/2016   Facet hypertrophy of lumbosacral region 04/03/2016   Chronic back pain 04/03/2016   Right knee pain 02/19/2016   Rosacea 02/04/2016   Vaginal atrophy 01/31/2016   Idiopathic scoliosis 01/22/2016   Hip bursitis 12/25/2015   Biceps tendonitis on right 12/25/2015   Diverticulitis of colon 12/21/2015   GERD (gastroesophageal reflux disease) 11/27/2015   Osteoporosis 11/27/2015   GAD (generalized anxiety disorder) 11/27/2015   MDD (major depressive disorder), recurrent, in full remission (HCC) 11/27/2015   Chronic dryness of both eyes 11/27/2015   Acquired hypothyroidism 11/27/2015   Asthma, chronic 11/27/2015   Insomnia 11/27/2015   Seborrheic keratoses 11/27/2015   CAD in native artery 11/27/2015    PCP: Sandy Crumb, PA REFERRING PROVIDER: Wilhelmenia Harada, MD REFERRING DIAG:  Diagnosis  M25.851 (ICD-10-CM) - Right hip impingement syndrome    THERAPY DIAG:  Muscle weakness (generalized)  Pain in right hip  Other abnormalities of gait and mobility  Rationale for Evaluation and  Treatment: Rehabilitation  ONSET DATE: Date of Surgery: 07/30/2023   SUBJECTIVE:  SUBJECTIVE STATEMENT: The patient is continuing with some mild low back pain. She is not sleeping well at night. She walks without the device, and in community she is using a walker.   EVAL: The patient has a multi-year h/o R hip pain-- and underwent R hip arthroscopy with pincer debridement and labral repair. She presents s/p surgery with a standard walker (using the one she had at home) so she is carrying the device. She does not have a brace for night (verified with Vance Gell from Dr. Verline Glow office  to ensure this is correct). Patient has pain that is worse when laying down.   UPCOMING PROCEDURE: patient is having botox in her bladder and demonstrates she will need to be able to lift her R leg minimally during procedure.   PERTINENT HISTORY: Diabetes, HTN, hearing loss, CAD, h/o SI surgery in 2023 (pin) PAIN:  Are you having  pain? Yes: NPRS scale: 6/10, low back pain (R SI region) is a 7/10 today Pain location: R hip front/back Pain description: soreness, aching Aggravating factors: laying down-- hard to get comfortable Relieving factors: unsure  PRECAUTIONS: Other: hip arthroscopic procedure   WEIGHT BEARING RESTRICTIONS: Yes WBAT  FALLS:  Has patient fallen in last 6 months? No  LIVING ENVIRONMENT: Lives with: lives with their spouse Lives in: House/apartment Stairs: one step entry- level indoors Has following equipment at home: standard walker-- carries the walker as she moves throughut clinic  PATIENT GOALS: reduce hip pain, return to daily activities  NEXT MD VISIT: 08/14/23  OBJECTIVE:  Note: Objective measures were completed at Evaluation unless otherwise noted. PATIENT SURVEYS:  LEFS 13.8%   COGNITION: Overall cognitive status: Within functional limits for tasks assessed     SENSATION: WFL  DRESSING:   patient had allergy to waterproof bandage-- she has coban on and therefore  is not showering this wee  POSTURE: h/o scoliosis with R rib hump in thoracic spine, C curve concave to the L, and hip asymmetry-- L hip elevated as compared to the R hip   LOWER EXTREMITY ROM: *when patient moves into supine, she goes into end range ER and has >60 deg bilat. PT educated her that we want to prop with a pillow at night to prevent excessive hip ER Passive ROM Right eval Left eval  Hip flexion 90   Hip extension 0   Hip abduction 30   Hip adduction    Hip internal rotation    Hip external rotation *excessive Goes into 60+ deg in supine-- PT educated to limit to 20 degrees using pillow prop    Knee flexion    Knee extension    Ankle dorsiflexion    Ankle plantarflexion    Ankle inversion    Ankle eversion     (Blank rows = not tested)  LOWER EXTREMITY MMT: Deferred due to post surgical status MMT Right eval Left eval  Hip flexion    Hip extension    Hip abduction    Hip adduction    Hip internal rotation    Hip external rotation    Knee flexion    Knee extension    Ankle dorsiflexion    Ankle plantarflexion    Ankle inversion    Ankle eversion     (Blank rows = not tested)  FUNCTIONAL TESTS:  Sit<>stand independently to device without UE support  GAIT: Distance walked: 50 ft Assistive device utilized: standard walker Level of assistance: Modified independence Comments: 2.11 ft/sec *PT switched patient out to RW so she is not carrying the standard walker. She is WBAT   [facility] Adult PT Treatment:                                                DATE: 08/12/23 Therapeutic Exercise: Supine PROM 70 degrees gentle hip circumduction CW and CCW Transverse contraction x 8 reps Glut set moving into beginner pelvic tilt x 3 seconds-- R SI pain remains L leg hip hike/depression with deflated ball under ankle (due to R SI pain-- working to mobilize through hips with minimal disruption to tissue healing) x 10 reps Glut set L side with deflated ball under ankle,  then on R (PT positioned and supported the leg) PROM IR/ER of the R hip within guidelines Hooklying ball squeeze  isometric Standing Glut set x 10 reps Heel raises with UE support x 10 reps Modified cat/cow leaning on countertop x 5 reps Trunk flexion with UE support at counter due to R SI pain R knee flexion x 5 reps with UE support (unable to do in prone due to scoliosis) Seated Pelvic tilts x 5 reps Manual Therapy: Self massage for R SI region and glut using a ball against wall Gait: Gait activities without device x 50 feet x 4 reps-- pt not using device in home                                                                                                                    [facility] Adult PT Treatment:                                                DATE: 08/05/23 Therapeutic Exercise: Supine Glut set x 10 reps Quad set x 10 reps PROM hip flexion PROM hip IR/ER Transverse abdominus activation x 5 reps Diaphragmatic breathing x 5 reps Gait: With SW-- adjusted to lower height  PATIENT EDUCATION:  Education details: HEP,  Person educated: Patient Education method: Programmer, multimedia, Facilities manager, and Handouts Education comprehension: verbalized understanding, returned demonstration, and needs further education  HOME EXERCISE PROGRAM: Access Code: [email or web address] URL: [email or web address] Date: 08/12/2023 Prepared by: Trygve Gage  Program Notes Avoid sitting for more than 20-30 minutes at a time.Since you are not comfortable on your stomach, lay flat with legs out (prop a folded pillow beside the R foot to prevent it turning out) for 15 minutes, 4 times/day to ensure the hip does not get tight.Do not bend past 90 degrees and AVOID straight leg raise.All exercises should be pain free.  Exercises - Supine Gluteal Sets  - 2 x daily - 7 x weekly - 1 sets - 10 reps - 5 seconds hold - Supine Quadricep Sets  - 2 x daily - 7 x weekly - 1 sets - 10 reps - 5 seconds hold - Supine  Diaphragmatic Breathing  - 2 x daily - 7 x weekly - 1 sets - 5 reps - Supine Transversus Abdominis Bracing - Hands on Stomach  - 2 x daily - 7 x weekly - 1 sets - 5 reps - 5 seconds hold - Supine Hip Extension with Footstool (BKA)  - 2 x daily - 7 x weekly - 1 sets - 10 reps - Heel Raises with Counter Support  - 2 x daily - 7 x weekly - 1 sets - 10 reps - Seated Pelvic Tilt  - 2 x daily - 5 x weekly - 1 sets - 10 reps - Modified Cat Cow on Counter  - 2 x daily - 5 x weekly - 1 sets - 10 reps - Standing Glute Med Mobilization with Small Ball on Wall  - 2 x daily - 5 x  weekly - 1 sets - 10 reps  ASSESSMENT:  CLINICAL IMPRESSION: The patient has continued R hip pain (SI region).  PT provided some exercises focusing on low back mobility (within protocol parameters). PT to continue to progress per protocol.   EVAL: Patient is a 68 y.o. female who was seen today for physical therapy evaluation and treatment S/P R hip arthroscopic surgery. She presents with impairments in hip ROM, muscle strength, functional mobility, gait speed, and balance. She also has h/o scoliosis which leads to asymmetry. Patient presents today with excessive ROM into ER and PT reviewed limitations of ROM at this time. Patient will benefit from PT services to improve strength, progress ambulation, and return to ADLs and IADLs.  OBJECTIVE IMPAIRMENTS: Abnormal gait, decreased activity tolerance, decreased balance, decreased endurance, decreased ROM, decreased strength, hypomobility, increased fascial restrictions, impaired flexibility, postural dysfunction, and pain.   GOALS: Goals reviewed with patient? Yes  SHORT TERM GOALS: Target date: 09/04/23     Patient will be independent and compliant with initial HEP.    Baseline: issued at eval  Goal status: INITIAL   2.  Patient will demonstrate step through gait pattern without AD.  Baseline: step to with crutches Goal status: INITIAL   3.  Patient will demonstrate at least  90 degrees of active hip flexion ROM to improve ability to complete squatting activity.  Baseline: see PROM above  Goal status: INITIAL   4.  Patient will be able to ascend/descend stairs with reciprocal pattern. Baseline: step to with crutches  Goal status: INITIAL     LONG TERM GOALS: Target date: 10/04/23     Patient will score >/= 40% on the LEFS (MCID is 9) to signify clinically meaningful improvement in functional abilities.    Baseline: 13.8%  Goal status: INITIAL   2.  Patient will demonstrate at least 4/5 R hip strength to improve stability with walking/standing activity.  Baseline: deferred  Goal status: INITIAL   3.  Patient will demonstrate functional and pain free R hip ROM.  Baseline: see PROM above  Goal status: INITIAL   4.  Patient will be able to ambulate community distances without AD without hip pain.  Baseline: see gait above  Goal status: INITIAL   5.  Patient will be independent with advanced home program to progress/maintain current level of function.  Baseline: initial HEP issued  Goal status: INITIAL   6.  Patient will maintain SLS on the RLE for at least 10 seconds to improve gait stability.  Baseline: unable  Goal status: INITIAL   PLAN:  PT FREQUENCY: 2x/week  PT DURATION: 8 weeks  PLANNED INTERVENTIONS: 97164- PT Re-evaluation, 97110-Therapeutic exercises, 97530- Therapeutic activity, 97112- Neuromuscular re-education, 97535- Self Care, [id number]- Manual therapy, [phone]- Gait training, Patient/Family education, Balance training, Taping, Dry Needling, Cryotherapy, and Moist heat  PLAN FOR NEXT SESSION: check HEP; progress via protocol    Darnice Comrie, PT 08/12/2023, 1:00 PM

## 2023-08-14 ENCOUNTER — Other Ambulatory Visit (HOSPITAL_BASED_OUTPATIENT_CLINIC_OR_DEPARTMENT_OTHER): Payer: Self-pay

## 2023-08-14 ENCOUNTER — Ambulatory Visit (HOSPITAL_BASED_OUTPATIENT_CLINIC_OR_DEPARTMENT_OTHER): Admitting: Orthopaedic Surgery

## 2023-08-14 ENCOUNTER — Encounter (HOSPITAL_BASED_OUTPATIENT_CLINIC_OR_DEPARTMENT_OTHER): Admitting: Orthopaedic Surgery

## 2023-08-14 DIAGNOSIS — M25851 Other specified joint disorders, right hip: Secondary | ICD-10-CM

## 2023-08-14 MED ORDER — LIDOCAINE HCL 1 % IJ SOLN
4.0000 mL | INTRAMUSCULAR | Status: AC | PRN
  Administered 2023-08-14: 4 mL

## 2023-08-14 MED ORDER — TRIAMCINOLONE ACETONIDE 40 MG/ML IJ SUSP
80.0000 mg | INTRAMUSCULAR | Status: AC | PRN
  Administered 2023-08-14: 80 mg via INTRA_ARTICULAR

## 2023-08-14 NOTE — Progress Notes (Signed)
 Post Operative Evaluation    Procedure/Date of Surgery: Right hip pincer debridement 5/1  Interval History:    Presents 2 weeks status post the above procedure.  Today she is experiencing more lateral based hip pain around the glutes.  Denies any hip pain.  This is much improved   PMH/PSH/Family History/Social History/Meds/Allergies:    Past Medical History:  Diagnosis Date  . Allergy 1992  . Anxiety   . Arthritis    "right little finger; base of thumb left hand; spine" (07/07/2017)  . Asthma, chronic 11/27/2015  . Cataract 2021  . Chronic back pain   . Coronary artery disease    4/19 PCI/DEx2 to LAD, and PCI/DES x1 to RCA, normal EF  . Depression   . Diabetes mellitus without complication (HCC) 2020  . Diverticulitis of colon 12/21/2015   Colonoscopy every 5 years/digestive health.   . GERD (gastroesophageal reflux disease) 11/27/2015  . History of blood transfusion    "related to spinal fusions"  . Hyperlipidemia LDL goal <70 11/29/2018  . Hypertension   . Hypothyroidism   . Idiopathic scoliosis 01/22/2016  . Osteoporosis 11/27/2015   On prolia .   . Pneumonia 2016  . Pneumothorax 06/26/2017  . Pre-diabetes   . Sleep apnea 2020   does not use CPAP  . Thyroid  disease    Past Surgical History:  Procedure Laterality Date  . ABDOMINAL HYSTERECTOMY    . bladder mesh     S/P bladder sling"  . COLON SURGERY  2007  . CORONARY PRESSURE/FFR STUDY N/A 07/07/2017   Procedure: INTRAVASCULAR PRESSURE WIRE/FFR STUDY;  Surgeon: Arleen Lacer, MD;  Location: [facility] INVASIVE CV LAB;  Service: Cardiovascular;  Laterality: N/A;  . CORONARY STENT INTERVENTION N/A 07/07/2017   Procedure: CORONARY STENT INTERVENTION;  Surgeon: Arleen Lacer, MD;  Location: [facility] INVASIVE CV LAB;  Service: Cardiovascular;  Laterality: N/A;  . ELBOW SURGERY Right    "dislocation"  . HIP ARTHROSCOPY Right 07/30/2023   Procedure: ARTHROSCOPY HIP;  Surgeon: Wilhelmenia Harada, MD;  Location: [facility];  Service: Orthopedics;  Laterality: Right;  RIGHT HIP ARTHROSCOPY WITH PINCER DEBRIDEMENT  . INCONTINENCE SURGERY     "didn't work"  . JOINT REPLACEMENT    . LEFT HEART CATH AND CORONARY ANGIOGRAPHY N/A 07/07/2017   Procedure: LEFT HEART CATH AND CORONARY ANGIOGRAPHY;  Surgeon: Arleen Lacer, MD;  Location: [facility] INVASIVE CV LAB;  Service: Cardiovascular;  Laterality: N/A;  . SMALL INTESTINE SURGERY    . SPINAL FUSION  ~ 1970 X 3   "below neck - lower back"  . TONSILLECTOMY    . TOTAL HIP ARTHROPLASTY Left    Social History   Socioeconomic History  . Marital status: Married    Spouse name: Marylou Sobers  . Number of children: 1  . Years of education: 28  . Highest education level: Bachelor's degree (e.g., BA, AB, BS)  Occupational History  . Occupation: Retired  Tobacco Use  . Smoking status: Never  . Smokeless tobacco: Never  Vaping Use  . Vaping status: Never Used  Substance and Sexual Activity  . Alcohol use: Yes    Comment: Maybe 3 drinks/year  . Drug use: No  . Sexual activity: Not Currently    Birth control/protection: None, Surgical    Comment: hyst  Other Topics Concern  .  Not on file  Social History Narrative   Lives with her husband. She has one child. She enjoys playing games on the computer.   Social Drivers of Corporate investment banker Strain: Low Risk  (07/04/2023)   Received from [facility]   Overall Financial Resource Strain (CARDIA)   . Difficulty of Paying Living Expenses: Not very hard  Food Insecurity: No Food Insecurity (07/04/2023)   Received from [facility]   Hunger Vital Sign   . Worried About Programme researcher, broadcasting/film/video in the Last Year: Never true   . Ran Out of Food in the Last Year: Never true  Transportation Needs: No Transportation Needs (07/04/2023)   Received from [facility] - Transportation   . Lack of Transportation (Medical): No   . Lack of Transportation (Non-Medical): No   Physical Activity: Unknown (07/04/2023)   Received from [facility]   Exercise Vital Sign   . Days of Exercise per Week: 0 days   . Minutes of Exercise per Session: Not on file  Recent Concern: Physical Activity - Inactive (05/23/2023)   Exercise Vital Sign   . Days of Exercise per Week: 0 days   . Minutes of Exercise per Session: 0 min  Stress: No Stress Concern Present (07/04/2023)   Received from [facility] - Occupational Stress Questionnaire   . Feeling of Stress : Only a little  Social Connections: Somewhat Isolated (07/04/2023)   Received from [facility]   Social Network   . How would you rate your social network (family, work, friends)?: Restricted participation with some degree of social isolation   Family History  Problem Relation Age of Onset  . Cancer Mother        lung  . Depression Mother   . Early death Mother   . Heart attack Father   . Hyperlipidemia Father   . Hypertension Father   . Arthritis Father   . Heart disease Father   . Diabetes Maternal Aunt   . Hyperlipidemia Paternal Aunt   . COPD Paternal Aunt   . Hypertension Paternal Aunt   . Stroke Paternal Aunt    Allergies  Allergen Reactions  . Hydromorphone Rash  . Levofloxacin Other (See Comments)    Hallucinations  . Meperidine Nausea And Vomiting and Other (See Comments)    Also "doesn't work well for my pain"  . Broccoli [Brassica Oleracea] Nausea Only and Other (See Comments)    Causes stomach pain also  . Zaleplon     Trouble breathing  . Adhesive [Tape] Rash   Current Outpatient Medications  Medication Sig Dispense Refill  . albuterol  (VENTOLIN  HFA) 108 (90 Base) MCG/ACT inhaler INHALE 1-2 PUFFS INTO THE LUNGS EVERY 4 HOURS AS NEEDED FOR WHEEZING OR SHORTNESS OF BREATH. 18 g 2  . aspirin  EC 325 MG tablet Take 1 tablet (325 mg total) by mouth daily. 14 tablet 0  . atorvastatin  (LIPITOR) 20 MG tablet Take 1 tablet (20 mg total) by mouth daily.  90 tablet 2  . brexpiprazole (REXULTI) 1 MG TABS tablet Take 1 mg by mouth daily.    . busPIRone  (BUSPAR ) 15 MG tablet Take 60 mg by mouth daily.    . calcium -vitamin D  (OSCAL WITH D) 500-200 MG-UNIT tablet Take 1 tablet by mouth daily.    . clonazePAM  (KLONOPIN ) 0.5 MG tablet Take 0.25 mg by mouth at bedtime.     . denosumab  (PROLIA ) 60 MG/ML SOLN injection  Inject 60 mg into the skin every 6 (six) months. Administer in upper arm, thigh, or abdomen    . desvenlafaxine  (PRISTIQ ) 100 MG 24 hr tablet Take 100 mg by mouth in the morning.    . Empagliflozin-metFORMIN  HCl ER (SYNJARDY  XR) 25-1000 MG TB24 Take 1 tablet by mouth daily. 90 tablet 1  . fluticasone  (FLONASE ) 50 MCG/ACT nasal spray SPRAY 2 SPRAYS INTO EACH NOSTRIL EVERY DAY 48 mL 1  . gabapentin  (NEURONTIN ) 300 MG capsule TAKE 1 CAPSULE IN THE MORNING AND 2 CAPSULES AT BEDTIME (Patient taking differently: TAKE  2 CAPSULES AT BEDTIME) 270 capsule 3  . GEMTESA 75 MG TABS TAKE 1 TABLET BY MOUTH EVERY DAY 90 tablet 1  . ipratropium-albuterol  (DUONEB) 0.5-2.5 (3) MG/3ML SOLN Take 3 mLs by nebulization every 2 (two) hours as needed (wheeze, SOB). 60 mL 0  . ketoconazole (NIZORAL) 2 % shampoo Apply topically.    . levothyroxine  (SYNTHROID ) 137 MCG tablet TAKE 1 TABLET ONCE A WEEK THEN 1/2 TABLET ALL OTHER DAYS AS DIRECTED 52 tablet 2  . Menthol, Topical Analgesic, (MINERAL ICE EX) Apply 1 application to affected sites one to two times a day as needed (for pain)    . metoprolol  succinate (TOPROL -XL) 25 MG 24 hr tablet TAKE 1 TABLET (25 MG TOTAL) BY MOUTH DAILY. 90 tablet 2  . nitroGLYCERIN  (NITROSTAT ) 0.4 MG SL tablet DISSOLVE 1 TAB UNDER THE TONGUE FOR CHEST PAIN EVERY 5 MINUTES UP TO 3 DOSES IF PAIN PERSIST CALL 911/SEEK MEDICAL HELP 75 tablet 2  . Omega-3 Fatty Acids (FISH OIL PO) Take 1 capsule by mouth daily.     . oxyCODONE  (ROXICODONE ) 5 MG immediate release tablet Take 1 tablet (5 mg total) by mouth every 4 (four) hours as needed for severe  pain (pain score 7-10) or breakthrough pain. (Patient not taking: Reported on 08/05/2023) 10 tablet 0  . pantoprazole  (PROTONIX ) 40 MG tablet TAKE 1 TABLET EVERY DAY 90 tablet 3  . tolterodine (DETROL LA) 2 MG 24 hr capsule Take 2 mg by mouth in the morning.    . trazodone  (DESYREL ) 300 MG tablet Take 300 mg by mouth at bedtime.     Current Facility-Administered Medications  Medication Dose Route Frequency Provider Last Rate Last Admin  . denosumab  (PROLIA ) injection 60 mg  60 mg Subcutaneous Once Breeback, Jade L, PA-C       No results found.  Review of Systems:   A ROS was performed including pertinent positives and negatives as documented in the HPI.   Musculoskeletal Exam:    There were no vitals taken for this visit.  Right hip incisions are well-appearing without erythema or drainage.  Some tenderness about the greater trochanter which radiates to the posterior buttocks without any groin pain.  30 degrees internal/external rotation of the right hip without pain  Imaging:      I personally reviewed and interpreted the radiographs.   Assessment:   2-week status post right hip pincer debridement doing well from a femoral acetabular standpoint.  Today she is having more glutes symptoms for which I have recommended ultrasound-guided glue injection.  I will plan to see her back in 4 weeks for reassessment  Plan :    - Right hip lateral trochanteric injection provided after verbal consent obtained    Procedure Note  Patient: Leslie Roberson             Date of Birth: [birth year]  MRN: [id number]             Visit Date: 08/14/2023  Procedures: Visit Diagnoses:  1. Right hip impingement syndrome     Large Joint Inj: R greater trochanter on 08/14/2023 3:03 PM Indications: pain Details: 22 G 3.5 in needle, ultrasound-guided anterolateral approach  Arthrogram: No  Medications: 4 mL lidocaine  1 %; 80 mg triamcinolone  acetonide 40 MG/ML Outcome: tolerated well,  no immediate complications Procedure, treatment alternatives, risks and benefits explained, specific risks discussed. Consent was given by the patient. Immediately prior to procedure a time out was called to verify the correct patient, procedure, equipment, support staff and site/side marked as required. Patient was prepped and draped in the usual sterile fashion.           I personally saw and evaluated the patient, and participated in the management and treatment plan.  Wilhelmenia Harada, MD Attending Physician, Orthopedic Surgery  This document was dictated using Dragon voice recognition software. A reasonable attempt at proof reading has been made to minimize errors.

## 2023-08-18 ENCOUNTER — Ambulatory Visit

## 2023-08-18 DIAGNOSIS — M6281 Muscle weakness (generalized): Secondary | ICD-10-CM | POA: Diagnosis not present

## 2023-08-18 DIAGNOSIS — M25541 Pain in joints of right hand: Secondary | ICD-10-CM

## 2023-08-18 DIAGNOSIS — R2689 Other abnormalities of gait and mobility: Secondary | ICD-10-CM

## 2023-08-18 DIAGNOSIS — R32 Unspecified urinary incontinence: Secondary | ICD-10-CM | POA: Diagnosis not present

## 2023-08-18 DIAGNOSIS — M25551 Pain in right hip: Secondary | ICD-10-CM

## 2023-08-18 NOTE — Therapy (Signed)
 OUTPATIENT PHYSICAL THERAPY LOWER EXTREMITY TREATMENT  Patient Name: Leslie Roberson MRN: [id number] DOB:10/09/1955, 68 y.o., female Today's Date: 08/18/2023  END OF SESSION:  PT End of Session - 08/18/23 1016     Visit Number 3    Number of Visits 16    Date for PT Re-Evaluation 10/04/23    Authorization Type [employer]    PT Start Time 1018    PT Stop Time 1056    PT Time Calculation (min) 38 min    Activity Tolerance Patient tolerated treatment well    Behavior During Therapy WFL for tasks assessed/performed             Past Medical History:  Diagnosis Date   Allergy 1992   Anxiety    Arthritis    "right little finger; base of thumb left hand; spine" (07/07/2017)   Asthma, chronic 11/27/2015   Cataract 2021   Chronic back pain    Coronary artery disease    4/19 PCI/DEx2 to LAD, and PCI/DES x1 to RCA, normal EF   Depression    Diabetes mellitus without complication (HCC) 2020   Diverticulitis of colon 12/21/2015   Colonoscopy every 5 years/digestive health.    GERD (gastroesophageal reflux disease) 11/27/2015   History of blood transfusion    "related to spinal fusions"   Hyperlipidemia LDL goal <70 11/29/2018   Hypertension    Hypothyroidism    Idiopathic scoliosis 01/22/2016   Osteoporosis 11/27/2015   On prolia .    Pneumonia 2016   Pneumothorax 06/26/2017   Pre-diabetes    Sleep apnea 2020   does not use CPAP   Thyroid  disease    Past Surgical History:  Procedure Laterality Date   ABDOMINAL HYSTERECTOMY     bladder mesh     S/P bladder sling"   COLON SURGERY  2007   CORONARY PRESSURE/FFR STUDY N/A 07/07/2017   Procedure: INTRAVASCULAR PRESSURE WIRE/FFR STUDY;  Surgeon: Arleen Lacer, MD;  Location: [facility] INVASIVE CV LAB;  Service: Cardiovascular;  Laterality: N/A;   CORONARY STENT INTERVENTION N/A 07/07/2017   Procedure: CORONARY STENT INTERVENTION;  Surgeon: Arleen Lacer, MD;  Location: [facility] INVASIVE CV LAB;  Service: Cardiovascular;   Laterality: N/A;   ELBOW SURGERY Right    "dislocation"   HIP ARTHROSCOPY Right 07/30/2023   Procedure: ARTHROSCOPY HIP;  Surgeon: Wilhelmenia Harada, MD;  Location: [facility];  Service: Orthopedics;  Laterality: Right;  RIGHT HIP ARTHROSCOPY WITH PINCER DEBRIDEMENT   INCONTINENCE SURGERY     "didn't work"   JOINT REPLACEMENT     LEFT HEART CATH AND CORONARY ANGIOGRAPHY N/A 07/07/2017   Procedure: LEFT HEART CATH AND CORONARY ANGIOGRAPHY;  Surgeon: Arleen Lacer, MD;  Location: [facility] INVASIVE CV LAB;  Service: Cardiovascular;  Laterality: N/A;   SMALL INTESTINE SURGERY     SPINAL FUSION  ~ 1970 X 3   "below neck - lower back"   TONSILLECTOMY     TOTAL HIP ARTHROPLASTY Left    Patient Active Problem List   Diagnosis Date Noted   Right hip impingement syndrome 07/30/2023   Chest congestion 06/03/2023   Toe blister without infection, left, initial encounter 06/02/2023   Nasal congestion 12/31/2022   Fever and chills 12/26/2022   Routine health maintenance 09/08/2022   Need for antibiotic prophylaxis for dental procedure 08/05/2022   Sinobronchitis 07/29/2022   Community acquired pneumonia of left lower lobe of lung 12/11/2021   Closed dislocation of metacarpophalangeal joint of right thumb 11/18/2021  Mass of right axilla 05/15/2021   Ear fullness, left 04/08/2021   Bilateral hearing loss 04/05/2021   Impacted cerumen, left ear 04/05/2021   Controlled type 2 diabetes mellitus with complication, without long-term current use of insulin (HCC) 11/16/2020   Right hand pain 11/16/2020   Fall 11/16/2020   Cough 05/18/2020   Paroxysmal nocturnal dyspnea 05/14/2020   Orthopnea 05/14/2020   Acute conjunctivitis of both eyes 01/23/2020   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   Ingrown right greater toenail 07/19/2019   Status post hip replacement, left 07/19/2019   Left hip pain 07/19/2019   OSA on CPAP 05/20/2019   Snoring 05/02/2019   Non-restorative sleep  05/02/2019   Migraine without aura and without status migrainosus, not intractable 02/08/2019   OAB (overactive bladder) 02/08/2019   Diabetes mellitus without complication (HCC) 02/07/2019   Hyperlipidemia LDL goal <70 11/29/2018   DJD (degenerative joint disease), lumbosacral 11/29/2018   Pre-operative clearance 11/29/2018   Nasal dryness 02/06/2018   Arthralgia 02/05/2018   Rhinorrhea 01/04/2018   Excessive sweating 09/22/2017   Tongue mass 08/14/2017   Angina, class III (HCC) 07/07/2017   S/P cardiac cath 07/07/2017   CAD S/P percutaneous coronary angioplasty    Pneumothorax 06/25/2017   Chest tightness 05/24/2017   Itchy scalp 05/24/2017   Right leg pain 02/06/2017   Chronic tension-type headache, intractable 02/05/2017   Prediabetes 02/05/2017   Lump in throat 01/27/2017   Dysphagia 01/27/2017   No energy 09/15/2016   Bad taste in mouth 07/26/2016   Paresthesia 04/03/2016   Facet hypertrophy of lumbosacral region 04/03/2016   Chronic back pain 04/03/2016   Right knee pain 02/19/2016   Rosacea 02/04/2016   Vaginal atrophy 01/31/2016   Idiopathic scoliosis 01/22/2016   Hip bursitis 12/25/2015   Biceps tendonitis on right 12/25/2015   Diverticulitis of colon 12/21/2015   GERD (gastroesophageal reflux disease) 11/27/2015   Osteoporosis 11/27/2015   GAD (generalized anxiety disorder) 11/27/2015   MDD (major depressive disorder), recurrent, in full remission (HCC) 11/27/2015   Chronic dryness of both eyes 11/27/2015   Acquired hypothyroidism 11/27/2015   Asthma, chronic 11/27/2015   Insomnia 11/27/2015   Seborrheic keratoses 11/27/2015   CAD in native artery 11/27/2015    PCP: Sandy Crumb, PA REFERRING PROVIDER: Wilhelmenia Harada, MD REFERRING DIAG:  Diagnosis  M25.851 (ICD-10-CM) - Right hip impingement syndrome    THERAPY DIAG:  Muscle weakness (generalized)  Pain in right hip  Other abnormalities of gait and mobility  Pain in joint of right  hand  Rationale for Evaluation and Treatment: Rehabilitation  ONSET DATE: Date of Surgery: 07/30/2023   SUBJECTIVE:  SUBJECTIVE STATEMENT: Patient reports she is sore in hip and has sharp pain in Lt glute. Patient states she has not been using walker or cane for a few days now.    EVAL: The patient has a multi-year h/o R hip pain-- and underwent R hip arthroscopy with pincer debridement and labral repair. She presents s/p surgery with a standard walker (using the one she had at home) so she is carrying the device. She does not have a brace for night (verified with Vance Gell from Dr. Verline Glow office  to ensure this is correct). Patient has pain that is worse when laying down.   UPCOMING PROCEDURE: patient is having botox in her bladder and demonstrates she will need to be able to lift her R leg minimally during procedure.   PERTINENT HISTORY: Diabetes, HTN, hearing loss, CAD, h/o SI surgery in  2023 (pin) PAIN:  Are you having pain? Yes: NPRS scale: 6/10, low back pain (R SI region) is a 7/10 today Pain location: R hip front/back Pain description: soreness, aching Aggravating factors: laying down-- hard to get comfortable Relieving factors: unsure  PRECAUTIONS: Other: hip arthroscopic procedure   WEIGHT BEARING RESTRICTIONS: Yes WBAT  FALLS:  Has patient fallen in last 6 months? No  LIVING ENVIRONMENT: Lives with: lives with their spouse Lives in: House/apartment Stairs: one step entry- level indoors Has following equipment at home: standard walker-- carries the walker as she moves throughut clinic  PATIENT GOALS: reduce hip pain, return to daily activities  NEXT MD VISIT: 09/11/23  OBJECTIVE:  Note: Objective measures were completed at Evaluation unless otherwise noted. PATIENT SURVEYS:  LEFS 13.8%   COGNITION: Overall cognitive status: Within functional limits for tasks assessed     SENSATION: WFL  DRESSING:   patient had allergy to waterproof bandage-- she has coban  on and therefore is not showering this wee  POSTURE: h/o scoliosis with R rib hump in thoracic spine, C curve concave to the L, and hip asymmetry-- L hip elevated as compared to the R hip   LOWER EXTREMITY ROM: *when patient moves into supine, she goes into end range ER and has >60 deg bilat. PT educated her that we want to prop with a pillow at night to prevent excessive hip ER Passive ROM Right eval Left eval  Hip flexion 90   Hip extension 0   Hip abduction 30   Hip adduction    Hip internal rotation    Hip external rotation *excessive Goes into 60+ deg in supine-- PT educated to limit to 20 degrees using pillow prop    Knee flexion    Knee extension    Ankle dorsiflexion    Ankle plantarflexion    Ankle inversion    Ankle eversion     (Blank rows = not tested)  LOWER EXTREMITY MMT: Deferred due to post surgical status MMT Right eval Left eval  Hip flexion    Hip extension    Hip abduction    Hip adduction    Hip internal rotation    Hip external rotation    Knee flexion    Knee extension    Ankle dorsiflexion    Ankle plantarflexion    Ankle inversion    Ankle eversion     (Blank rows = not tested)  FUNCTIONAL TESTS:  Sit<>stand independently to device without UE support  GAIT: Distance walked: 50 ft Assistive device utilized: standard walker Level of assistance: Modified independence Comments: 2.11 ft/sec *PT switched patient out to RW so she is not carrying the standard walker. She is WBAT   [facility] Adult PT Treatment:                                                DATE: 5/202/2025 Therapeutic Exercise: Rt hip PROM per protocol Stair navigation --> 6" steps + 1 railing Standing:  PPT Rt knee flexion PROM Manual Therapy: IASTM Rt gluteals/SI region Neuromuscular re-ed: Bent knee fall out x10 (bil) Side Lying: Clamshells 2x10 Reverse clamshells 2x10 Quadruped: Rocking Pelvic tilts Self Care: Review of protocol and remaining  compliant    [facility] Adult PT Treatment:  DATE: 08/12/23 Therapeutic Exercise: Supine PROM 70 degrees gentle hip circumduction CW and CCW Transverse contraction x 8 reps Glut set moving into beginner pelvic tilt x 3 seconds-- R SI pain remains L leg hip hike/depression with deflated ball under ankle (due to R SI pain-- working to mobilize through hips with minimal disruption to tissue healing) x 10 reps Glut set L side with deflated ball under ankle, then on R (PT positioned and supported the leg) PROM IR/ER of the R hip within guidelines Hooklying ball squeeze isometric Standing Glut set x 10 reps Heel raises with UE support x 10 reps Modified cat/cow leaning on countertop x 5 reps Trunk flexion with UE support at counter due to R SI pain R knee flexion x 5 reps with UE support (unable to do in prone due to scoliosis) Seated Pelvic tilts x 5 reps Manual Therapy: Self massage for R SI region and glut using a ball against wall Gait: Gait activities without device x 50 feet x 4 reps-- pt not using device in home                                                                                                                    [facility] Adult PT Treatment:                                                DATE: 08/05/23 Therapeutic Exercise: Supine Glut set x 10 reps Quad set x 10 reps PROM hip flexion PROM hip IR/ER Transverse abdominus activation x 5 reps Diaphragmatic breathing x 5 reps Gait: With SW-- adjusted to lower height  PATIENT EDUCATION:  Education details: HEP,  Person educated: Patient Education method: Programmer, multimedia, Facilities manager, and Handouts Education comprehension: verbalized understanding, returned demonstration, and needs further education  HOME EXERCISE PROGRAM: Access Code: [email or web address] URL: [email or web address] Date: 08/18/2023 Prepared by: Sims Duck  Program Notes Avoid sitting for more than 20-30  minutes at a time.Since you are not comfortable on your stomach, lay flat with legs out (prop a folded pillow beside the R foot to prevent it turning out) for 15 minutes, 4 times/day to ensure the hip does not get tight.Do not bend past 90 degrees and AVOID straight leg raise.All exercises should be pain free.  Exercises - Supine Gluteal Sets  - 2 x daily - 7 x weekly - 1 sets - 10 reps - 5 seconds hold - Supine Quadricep Sets  - 2 x daily - 7 x weekly - 1 sets - 10 reps - 5 seconds hold - Supine Transversus Abdominis Bracing - Hands on Stomach  - 2 x daily - 7 x weekly - 1 sets - 5 reps - 5 seconds hold - Heel Raises with Counter Support  - 2 x daily - 7 x weekly - 1 sets - 10 reps - Modified Cat Cow on Counter  - 1  x daily - 7 x weekly - 3 sets - 10 reps - Standing Glute Med Mobilization with Small Ball on Wall  - 2 x daily - 5 x weekly - 1 sets - 10 reps - Clamshell  - 1 x daily - 7 x weekly - 3 sets - 10 reps - Sidelying Reverse Clamshell  - 1 x daily - 7 x weekly - 3 sets - 10 reps   ASSESSMENT:  CLINICAL IMPRESSION:  Exercises progressed per protocol guidelines. Quadruped exercises added, however standing variations added to HEP due to patient unable to maintain protocol guidelines with transfer from quadruped to standing. Stair navigation reviewed to ensure safe stepping and mindful to stay within protocol with weight bearing and stability guidelines.  EVAL: Patient is a 68 y.o. female who was seen today for physical therapy evaluation and treatment S/P R hip arthroscopic surgery. She presents with impairments in hip ROM, muscle strength, functional mobility, gait speed, and balance. She also has h/o scoliosis which leads to asymmetry. Patient presents today with excessive ROM into ER and PT reviewed limitations of ROM at this time. Patient will benefit from PT services to improve strength, progress ambulation, and return to ADLs and IADLs.  OBJECTIVE IMPAIRMENTS: Abnormal gait, decreased  activity tolerance, decreased balance, decreased endurance, decreased ROM, decreased strength, hypomobility, increased fascial restrictions, impaired flexibility, postural dysfunction, and pain.   GOALS: Goals reviewed with patient? Yes  SHORT TERM GOALS: Target date: 09/04/23     Patient will be independent and compliant with initial HEP.    Baseline: issued at eval  Goal status: INITIAL   2.  Patient will demonstrate step through gait pattern without AD.  Baseline: step to with crutches Goal status: INITIAL   3.  Patient will demonstrate at least 90 degrees of active hip flexion ROM to improve ability to complete squatting activity.  Baseline: see PROM above  Goal status: INITIAL   4.  Patient will be able to ascend/descend stairs with reciprocal pattern. Baseline: step to with crutches  Goal status: INITIAL     LONG TERM GOALS: Target date: 10/04/23     Patient will score >/= 40% on the LEFS (MCID is 9) to signify clinically meaningful improvement in functional abilities.    Baseline: 13.8%  Goal status: INITIAL   2.  Patient will demonstrate at least 4/5 R hip strength to improve stability with walking/standing activity.  Baseline: deferred  Goal status: INITIAL   3.  Patient will demonstrate functional and pain free R hip ROM.  Baseline: see PROM above  Goal status: INITIAL   4.  Patient will be able to ambulate community distances without AD without hip pain.  Baseline: see gait above  Goal status: INITIAL   5.  Patient will be independent with advanced home program to progress/maintain current level of function.  Baseline: initial HEP issued  Goal status: INITIAL   6.  Patient will maintain SLS on the RLE for at least 10 seconds to improve gait stability.  Baseline: unable  Goal status: INITIAL   PLAN:  PT FREQUENCY: 2x/week  PT DURATION: 8 weeks  PLANNED INTERVENTIONS: 97164- PT Re-evaluation, 97110-Therapeutic exercises, 97530- Therapeutic activity,  97112- Neuromuscular re-education, 97535- Self Care, 09811- Manual therapy, [phone]- Gait training, Patient/Family education, Balance training, Taping, Dry Needling, Cryotherapy, and Moist heat  PLAN FOR NEXT SESSION: check HEP; progress via protocol    Flint Hummer, PTA 08/18/2023, 11:01 AM

## 2023-08-19 ENCOUNTER — Encounter (HOSPITAL_BASED_OUTPATIENT_CLINIC_OR_DEPARTMENT_OTHER): Payer: Self-pay | Admitting: Orthopaedic Surgery

## 2023-08-19 ENCOUNTER — Other Ambulatory Visit (HOSPITAL_BASED_OUTPATIENT_CLINIC_OR_DEPARTMENT_OTHER): Payer: Self-pay | Admitting: Orthopaedic Surgery

## 2023-08-19 DIAGNOSIS — M25851 Other specified joint disorders, right hip: Secondary | ICD-10-CM

## 2023-08-21 ENCOUNTER — Ambulatory Visit: Admitting: Rehabilitative and Restorative Service Providers"

## 2023-08-21 ENCOUNTER — Encounter (HOSPITAL_BASED_OUTPATIENT_CLINIC_OR_DEPARTMENT_OTHER): Payer: Self-pay | Admitting: Orthopaedic Surgery

## 2023-08-21 ENCOUNTER — Encounter: Payer: Self-pay | Admitting: Rehabilitative and Restorative Service Providers"

## 2023-08-21 DIAGNOSIS — M6281 Muscle weakness (generalized): Secondary | ICD-10-CM

## 2023-08-21 DIAGNOSIS — R2689 Other abnormalities of gait and mobility: Secondary | ICD-10-CM

## 2023-08-21 DIAGNOSIS — M25551 Pain in right hip: Secondary | ICD-10-CM

## 2023-08-21 NOTE — Therapy (Signed)
 OUTPATIENT PHYSICAL THERAPY LOWER EXTREMITY TREATMENT  Patient Name: Leslie Roberson MRN: [id number] DOB:Oct 18, 1955, 68 y.o., female Today's Date: 08/21/2023  END OF SESSION:  PT End of Session - 08/21/23 1105     Visit Number 4    Number of Visits 16    Date for PT Re-Evaluation 10/04/23    Authorization Type [employer]    PT Start Time 1105    PT Stop Time 1145    PT Time Calculation (min) 40 min    Activity Tolerance Patient tolerated treatment well    Behavior During Therapy WFL for tasks assessed/performed             Past Medical History:  Diagnosis Date   Allergy 1992   Anxiety    Arthritis    "right little finger; base of thumb left hand; spine" (07/07/2017)   Asthma, chronic 11/27/2015   Cataract 2021   Chronic back pain    Coronary artery disease    4/19 PCI/DEx2 to LAD, and PCI/DES x1 to RCA, normal EF   Depression    Diabetes mellitus without complication (HCC) 2020   Diverticulitis of colon 12/21/2015   Colonoscopy every 5 years/digestive health.    GERD (gastroesophageal reflux disease) 11/27/2015   History of blood transfusion    "related to spinal fusions"   Hyperlipidemia LDL goal <70 11/29/2018   Hypertension    Hypothyroidism    Idiopathic scoliosis 01/22/2016   Osteoporosis 11/27/2015   On prolia .    Pneumonia 2016   Pneumothorax 06/26/2017   Pre-diabetes    Sleep apnea 2020   does not use CPAP   Thyroid  disease    Past Surgical History:  Procedure Laterality Date   ABDOMINAL HYSTERECTOMY     bladder mesh     S/P bladder sling"   COLON SURGERY  2007   CORONARY PRESSURE/FFR STUDY N/A 07/07/2017   Procedure: INTRAVASCULAR PRESSURE WIRE/FFR STUDY;  Surgeon: Arleen Lacer, MD;  Location: [facility] INVASIVE CV LAB;  Service: Cardiovascular;  Laterality: N/A;   CORONARY STENT INTERVENTION N/A 07/07/2017   Procedure: CORONARY STENT INTERVENTION;  Surgeon: Arleen Lacer, MD;  Location: [facility] INVASIVE CV LAB;  Service: Cardiovascular;   Laterality: N/A;   ELBOW SURGERY Right    "dislocation"   HIP ARTHROSCOPY Right 07/30/2023   Procedure: ARTHROSCOPY HIP;  Surgeon: Wilhelmenia Harada, MD;  Location: [facility];  Service: Orthopedics;  Laterality: Right;  RIGHT HIP ARTHROSCOPY WITH PINCER DEBRIDEMENT   INCONTINENCE SURGERY     "didn't work"   JOINT REPLACEMENT     LEFT HEART CATH AND CORONARY ANGIOGRAPHY N/A 07/07/2017   Procedure: LEFT HEART CATH AND CORONARY ANGIOGRAPHY;  Surgeon: Arleen Lacer, MD;  Location: [facility] INVASIVE CV LAB;  Service: Cardiovascular;  Laterality: N/A;   SMALL INTESTINE SURGERY     SPINAL FUSION  ~ 1970 X 3   "below neck - lower back"   TONSILLECTOMY     TOTAL HIP ARTHROPLASTY Left    Patient Active Problem List   Diagnosis Date Noted   Right hip impingement syndrome 07/30/2023   Chest congestion 06/03/2023   Toe blister without infection, left, initial encounter 06/02/2023   Nasal congestion 12/31/2022   Fever and chills 12/26/2022   Routine health maintenance 09/08/2022   Need for antibiotic prophylaxis for dental procedure 08/05/2022   Sinobronchitis 07/29/2022   Community acquired pneumonia of left lower lobe of lung 12/11/2021   Closed dislocation of metacarpophalangeal joint of right thumb 11/18/2021  Mass of right axilla 05/15/2021   Ear fullness, left 04/08/2021   Bilateral hearing loss 04/05/2021   Impacted cerumen, left ear 04/05/2021   Controlled type 2 diabetes mellitus with complication, without long-term current use of insulin (HCC) 11/16/2020   Right hand pain 11/16/2020   Fall 11/16/2020   Cough 05/18/2020   Paroxysmal nocturnal dyspnea 05/14/2020   Orthopnea 05/14/2020   Acute conjunctivitis of both eyes 01/23/2020   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   Ingrown right greater toenail 07/19/2019   Status post hip replacement, left 07/19/2019   Left hip pain 07/19/2019   OSA on CPAP 05/20/2019   Snoring 05/02/2019   Non-restorative sleep  05/02/2019   Migraine without aura and without status migrainosus, not intractable 02/08/2019   OAB (overactive bladder) 02/08/2019   Diabetes mellitus without complication (HCC) 02/07/2019   Hyperlipidemia LDL goal <70 11/29/2018   DJD (degenerative joint disease), lumbosacral 11/29/2018   Pre-operative clearance 11/29/2018   Nasal dryness 02/06/2018   Arthralgia 02/05/2018   Rhinorrhea 01/04/2018   Excessive sweating 09/22/2017   Tongue mass 08/14/2017   Angina, class III (HCC) 07/07/2017   S/P cardiac cath 07/07/2017   CAD S/P percutaneous coronary angioplasty    Pneumothorax 06/25/2017   Chest tightness 05/24/2017   Itchy scalp 05/24/2017   Right leg pain 02/06/2017   Chronic tension-type headache, intractable 02/05/2017   Prediabetes 02/05/2017   Lump in throat 01/27/2017   Dysphagia 01/27/2017   No energy 09/15/2016   Bad taste in mouth 07/26/2016   Paresthesia 04/03/2016   Facet hypertrophy of lumbosacral region 04/03/2016   Chronic back pain 04/03/2016   Right knee pain 02/19/2016   Rosacea 02/04/2016   Vaginal atrophy 01/31/2016   Idiopathic scoliosis 01/22/2016   Hip bursitis 12/25/2015   Biceps tendonitis on right 12/25/2015   Diverticulitis of colon 12/21/2015   GERD (gastroesophageal reflux disease) 11/27/2015   Osteoporosis 11/27/2015   GAD (generalized anxiety disorder) 11/27/2015   MDD (major depressive disorder), recurrent, in full remission (HCC) 11/27/2015   Chronic dryness of both eyes 11/27/2015   Acquired hypothyroidism 11/27/2015   Asthma, chronic 11/27/2015   Insomnia 11/27/2015   Seborrheic keratoses 11/27/2015   CAD in native artery 11/27/2015    PCP: Sandy Crumb, PA REFERRING PROVIDER: Wilhelmenia Harada, MD REFERRING DIAG:  Diagnosis  M25.851 (ICD-10-CM) - Right hip impingement syndrome   THERAPY DIAG:  Muscle weakness (generalized)  Pain in right hip  Other abnormalities of gait and mobility  Rationale for Evaluation and  Treatment: Rehabilitation  ONSET DATE: Date of Surgery: 07/30/2023   SUBJECTIVE:  SUBJECTIVE STATEMENT: The patient is still not able to sleep well. Pain in the lateral hip is 5/10. Patient is not doing any HEP, she mentions standing exercises would be better.  EVAL: The patient has a multi-year h/o R hip pain-- and underwent R hip arthroscopy with pincer debridement and labral repair. She presents s/p surgery with a standard walker (using the one she had at home) so she is carrying the device. She does not have a brace for night (verified with Vance Gell from Dr. Verline Glow office  to ensure this is correct). Patient has pain that is worse when laying down.   UPCOMING PROCEDURE: patient is having botox in her bladder and demonstrates she will need to be able to lift her R leg minimally during procedure.   PERTINENT HISTORY: Diabetes, HTN, hearing loss, CAD, h/o SI surgery in 2023 (pin) PAIN:  Are you having pain? Yes: NPRS  scale: 5/10, low back pain (R SI region) is a 7/10 today Pain location: R hip front/back Pain description: soreness, aching Aggravating factors: laying down-- hard to get comfortable Relieving factors: unsure  PRECAUTIONS: Other: hip arthroscopic procedure   *on 5/23, able to progress to post 3 weeks and begin further progression into protocol*  WEIGHT BEARING RESTRICTIONS: Yes WBAT  FALLS:  Has patient fallen in last 6 months? No  LIVING ENVIRONMENT: Lives with: lives with their spouse Lives in: House/apartment Stairs: one step entry- level indoors Has following equipment at home: standard walker-- carries the walker as she moves throughut clinic  PATIENT GOALS: reduce hip pain, return to daily activities  NEXT MD VISIT: 09/11/23  OBJECTIVE:  Note: Objective measures were completed at Evaluation unless otherwise noted. PATIENT SURVEYS:  LEFS 13.8%   COGNITION: Overall cognitive status: Within functional limits for tasks  assessed     SENSATION: WFL  DRESSING:   patient had allergy to waterproof bandage-- she has coban on and therefore is not showering this wee  POSTURE: h/o scoliosis with R rib hump in thoracic spine, C curve concave to the L, and hip asymmetry-- L hip elevated as compared to the R hip   LOWER EXTREMITY ROM: *when patient moves into supine, she goes into end range ER and has >60 deg bilat. PT educated her that we want to prop with a pillow at night to prevent excessive hip ER Passive ROM Right eval Left eval  Hip flexion 90   Hip extension 0   Hip abduction 30   Hip adduction    Hip internal rotation    Hip external rotation *excessive Goes into 60+ deg in supine-- PT educated to limit to 20 degrees using pillow prop    Knee flexion    Knee extension    Ankle dorsiflexion    Ankle plantarflexion    Ankle inversion    Ankle eversion     (Blank rows = not tested)  LOWER EXTREMITY MMT: Deferred due to post surgical status MMT Right eval Left eval  Hip flexion    Hip extension    Hip abduction    Hip adduction    Hip internal rotation    Hip external rotation    Knee flexion    Knee extension    Ankle dorsiflexion    Ankle plantarflexion    Ankle inversion    Ankle eversion     (Blank rows = not tested)  FUNCTIONAL TESTS:  Sit<>stand independently to device without UE support  GAIT: Distance walked: 50 ft Assistive device utilized: standard walker Level of assistance: Modified independence Comments: 2.11 ft/sec *PT switched patient out to RW so she is not carrying the standard walker. She is WBAT  [facility] Adult PT Treatment:                                                DATE: 08/21/23 Therapeutic Exercise: R hip PROM per protocol Standing Knee flexion R x 10 reps Hip IR/ER with knee propped on stool Pelvic tilts against wall (she requested not to do quadriped due to discomfort last visit) Supine Lumbar small range rocking Hooklying glut set x 5  reps Modified thomas stretch (R leg straight) Hip ad and abduction hooklying isometrics x 5 reps each direction Manual Therapy: Self massage R glut, IT band IASTM and STM R  glut med, glut max, and IT Band Neuromuscular re-ed: Single leg stance and R weight shifting Gait: Gait observation with continued limping-- however notes x years due to scoliosis -- patient notes no pain with walking today without device. Stair negotiation x 6" steps x 8 reps with step to pattern.   [facility] Adult PT Treatment:                                                DATE: 5/202/2025 Therapeutic Exercise: Rt hip PROM per protocol Stair navigation --> 6" steps + 1 railing Standing:  PPT Rt knee flexion PROM Manual Therapy: IASTM Rt gluteals/SI region Neuromuscular re-ed: Bent knee fall out x10 (bil) Side Lying: Clamshells 2x10 Reverse clamshells 2x10 Quadruped: Rocking Pelvic tilts Self Care: Review of protocol and remaining compliant    [facility] Adult PT Treatment:                                                DATE: 08/12/23 Therapeutic Exercise: Supine PROM 70 degrees gentle hip circumduction CW and CCW Transverse contraction x 8 reps Glut set moving into beginner pelvic tilt x 3 seconds-- R SI pain remains L leg hip hike/depression with deflated ball under ankle (due to R SI pain-- working to mobilize through hips with minimal disruption to tissue healing) x 10 reps Glut set L side with deflated ball under ankle, then on R (PT positioned and supported the leg) PROM IR/ER of the R hip within guidelines Hooklying ball squeeze isometric Standing Glut set x 10 reps Heel raises with UE support x 10 reps Modified cat/cow leaning on countertop x 5 reps Trunk flexion with UE support at counter due to R SI pain R knee flexion x 5 reps with UE support (unable to do in prone due to scoliosis) Seated Pelvic tilts x 5 reps Manual Therapy: Self massage for R SI region and glut using a ball against  wall Gait: Gait activities without device x 50 feet x 4 reps-- pt not using device in home                                                                                                                    [facility] Adult PT Treatment:                                                DATE: 08/05/23 Therapeutic Exercise: Supine Glut set x 10 reps Quad set x 10 reps PROM hip flexion PROM hip IR/ER Transverse abdominus activation x 5 reps Diaphragmatic breathing x 5 reps Gait: With SW-- adjusted to lower  height  PATIENT EDUCATION:  Education details: HEP,  Person educated: Patient Education method: Explanation, Demonstration, and Handouts Education comprehension: verbalized understanding, returned demonstration, and needs further education  HOME EXERCISE PROGRAM: Access Code: [email or web address] URL: [email or web address] Date: 08/21/2023 Prepared by: Trygve Gage  Program Notes Avoid sitting for more than 20-30 minutes at a time.Since you are not comfortable on your stomach, lay flat with legs out (prop a folded pillow beside the R foot to prevent it turning out) for 15 minutes, 4 times/day to ensure the hip does not get tight.Do not bend past 90 degrees and AVOID straight leg raise.All exercises should be pain free.  Exercises - Heel Raises with Counter Support  - 2 x daily - 7 x weekly - 1 sets - 10 reps - Standing Alternating Knee Flexion  - 2 x daily - 7 x weekly - 1 sets - 10 reps - Modified Cat Cow on Counter  - 1 x daily - 7 x weekly - 3 sets - 10 reps - Standing Glute Med Mobilization with Small Ball on Wall  - 2 x daily - 5 x weekly - 1 sets - 10 reps  ASSESSMENT: CLINICAL IMPRESSION: PT began to progress to phase II of rehab for R hip. Patient is most limited by R glut and lateral hip pain at night-- PT performed STM and IASTM and reviewed self massage. We also discussed the need for HEP to help carryover rehab activities and engage musculature. Patient feels standing  activities may be easier to do during the day. She also notes fatigue as a barrier.  EVAL: Patient is a 68 y.o. female who was seen today for physical therapy evaluation and treatment S/P R hip arthroscopic surgery. She presents with impairments in hip ROM, muscle strength, functional mobility, gait speed, and balance. She also has h/o scoliosis which leads to asymmetry. Patient presents today with excessive ROM into ER and PT reviewed limitations of ROM at this time. Patient will benefit from PT services to improve strength, progress ambulation, and return to ADLs and IADLs.  OBJECTIVE IMPAIRMENTS: Abnormal gait, decreased activity tolerance, decreased balance, decreased endurance, decreased ROM, decreased strength, hypomobility, increased fascial restrictions, impaired flexibility, postural dysfunction, and pain.   GOALS: Goals reviewed with patient? Yes  SHORT TERM GOALS: Target date: 09/04/23    Patient will be independent and compliant with initial HEP.    Baseline: issued at eval  Goal status: INITIAL   2.  Patient will demonstrate step through gait pattern without AD.  Baseline: step to with crutches Goal status: MET on 08/21/23   3.  Patient will demonstrate at least 90 degrees of active hip flexion ROM to improve ability to complete squatting activity.  Baseline: see PROM above  Goal status: INITIAL   4.  Patient will be able to ascend/descend stairs with reciprocal pattern. Baseline: step to with crutches  Goal status: INITIAL   LONG TERM GOALS: Target date: 10/04/23     Patient will score >/= 40% on the LEFS (MCID is 9) to signify clinically meaningful improvement in functional abilities.    Baseline: 13.8%  Goal status: INITIAL   2.  Patient will demonstrate at least 4/5 R hip strength to improve stability with walking/standing activity.  Baseline: deferred  Goal status: INITIAL   3.  Patient will demonstrate functional and pain free R hip ROM.  Baseline: see PROM above   Goal status: INITIAL   4.  Patient will be able to ambulate community distances without AD without  hip pain.  Baseline: see gait above  Goal status: INITIAL   5.  Patient will be independent with advanced home program to progress/maintain current level of function.  Baseline: initial HEP issued  Goal status: INITIAL   6.  Patient will maintain SLS on the RLE for at least 10 seconds to improve gait stability.  Baseline: unable  Goal status: INITIAL  PLAN:  PT FREQUENCY: 2x/week  PT DURATION: 8 weeks  PLANNED INTERVENTIONS: [id number]- PT Re-evaluation, 97110-Therapeutic exercises, 97530- Therapeutic activity, 97112- Neuromuscular re-education, 97535- Self Care, [id number]- Manual therapy, [phone]- Gait training, Patient/Family education, Balance training, Taping, Dry Needling, Cryotherapy, and Moist heat  PLAN FOR NEXT SESSION: check HEP; progress via protocol CAN PROGRESS protocol -- CHECK STGS soon.   Elwyn Lowden, PT 08/21/2023, 11:06 AM

## 2023-08-24 ENCOUNTER — Encounter: Payer: Self-pay | Admitting: Physician Assistant

## 2023-08-25 DIAGNOSIS — M5126 Other intervertebral disc displacement, lumbar region: Secondary | ICD-10-CM | POA: Diagnosis not present

## 2023-08-25 DIAGNOSIS — Z1330 Encounter for screening examination for mental health and behavioral disorders, unspecified: Secondary | ICD-10-CM | POA: Diagnosis not present

## 2023-08-25 DIAGNOSIS — M545 Low back pain, unspecified: Secondary | ICD-10-CM | POA: Diagnosis not present

## 2023-08-25 DIAGNOSIS — G8929 Other chronic pain: Secondary | ICD-10-CM | POA: Diagnosis not present

## 2023-08-25 DIAGNOSIS — M461 Sacroiliitis, not elsewhere classified: Secondary | ICD-10-CM | POA: Diagnosis not present

## 2023-08-26 ENCOUNTER — Ambulatory Visit

## 2023-08-26 DIAGNOSIS — R2689 Other abnormalities of gait and mobility: Secondary | ICD-10-CM

## 2023-08-26 DIAGNOSIS — M25551 Pain in right hip: Secondary | ICD-10-CM

## 2023-08-26 DIAGNOSIS — M6281 Muscle weakness (generalized): Secondary | ICD-10-CM

## 2023-08-26 DIAGNOSIS — M25541 Pain in joints of right hand: Secondary | ICD-10-CM

## 2023-08-26 MED ORDER — SOLIFENACIN SUCCINATE 10 MG PO TABS: 10.0000 mg | ORAL_TABLET | Freq: Every day | ORAL | 1 refills | Status: DC

## 2023-08-26 NOTE — Therapy (Signed)
 OUTPATIENT PHYSICAL THERAPY LOWER EXTREMITY TREATMENT  Patient Name: Leslie Roberson MRN: [id number] DOB:January 31, 1956, 68 y.o., female Today's Date: 08/26/2023  END OF SESSION:  PT End of Session - 08/26/23 1402     Visit Number 5    Number of Visits 16    Date for PT Re-Evaluation 10/04/23    Authorization Type [employer]    PT Start Time 1402    PT Stop Time 1440    PT Time Calculation (min) 38 min    Activity Tolerance Patient tolerated treatment well    Behavior During Therapy WFL for tasks assessed/performed             Past Medical History:  Diagnosis Date   Allergy 1992   Anxiety    Arthritis    "right little finger; base of thumb left hand; spine" (07/07/2017)   Asthma, chronic 11/27/2015   Cataract 2021   Chronic back pain    Coronary artery disease    4/19 PCI/DEx2 to LAD, and PCI/DES x1 to RCA, normal EF   Depression    Diabetes mellitus without complication (HCC) 2020   Diverticulitis of colon 12/21/2015   Colonoscopy every 5 years/digestive health.    GERD (gastroesophageal reflux disease) 11/27/2015   History of blood transfusion    "related to spinal fusions"   Hyperlipidemia LDL goal <70 11/29/2018   Hypertension    Hypothyroidism    Idiopathic scoliosis 01/22/2016   Osteoporosis 11/27/2015   On prolia .    Pneumonia 2016   Pneumothorax 06/26/2017   Pre-diabetes    Sleep apnea 2020   does not use CPAP   Thyroid  disease    Past Surgical History:  Procedure Laterality Date   ABDOMINAL HYSTERECTOMY     bladder mesh     S/P bladder sling"   COLON SURGERY  2007   CORONARY PRESSURE/FFR STUDY N/A 07/07/2017   Procedure: INTRAVASCULAR PRESSURE WIRE/FFR STUDY;  Surgeon: Arleen Lacer, MD;  Location: [facility] INVASIVE CV LAB;  Service: Cardiovascular;  Laterality: N/A;   CORONARY STENT INTERVENTION N/A 07/07/2017   Procedure: CORONARY STENT INTERVENTION;  Surgeon: Arleen Lacer, MD;  Location: [facility] INVASIVE CV LAB;  Service: Cardiovascular;   Laterality: N/A;   ELBOW SURGERY Right    "dislocation"   HIP ARTHROSCOPY Right 07/30/2023   Procedure: ARTHROSCOPY HIP;  Surgeon: Wilhelmenia Harada, MD;  Location: [facility];  Service: Orthopedics;  Laterality: Right;  RIGHT HIP ARTHROSCOPY WITH PINCER DEBRIDEMENT   INCONTINENCE SURGERY     "didn't work"   JOINT REPLACEMENT     LEFT HEART CATH AND CORONARY ANGIOGRAPHY N/A 07/07/2017   Procedure: LEFT HEART CATH AND CORONARY ANGIOGRAPHY;  Surgeon: Arleen Lacer, MD;  Location: [facility] INVASIVE CV LAB;  Service: Cardiovascular;  Laterality: N/A;   SMALL INTESTINE SURGERY     SPINAL FUSION  ~ 1970 X 3   "below neck - lower back"   TONSILLECTOMY     TOTAL HIP ARTHROPLASTY Left    Patient Active Problem List   Diagnosis Date Noted   Right hip impingement syndrome 07/30/2023   Chest congestion 06/03/2023   Toe blister without infection, left, initial encounter 06/02/2023   Nasal congestion 12/31/2022   Fever and chills 12/26/2022   Routine health maintenance 09/08/2022   Need for antibiotic prophylaxis for dental procedure 08/05/2022   Sinobronchitis 07/29/2022   Community acquired pneumonia of left lower lobe of lung 12/11/2021   Closed dislocation of metacarpophalangeal joint of right thumb 11/18/2021  Mass of right axilla 05/15/2021   Ear fullness, left 04/08/2021   Bilateral hearing loss 04/05/2021   Impacted cerumen, left ear 04/05/2021   Controlled type 2 diabetes mellitus with complication, without long-term current use of insulin (HCC) 11/16/2020   Right hand pain 11/16/2020   Fall 11/16/2020   Cough 05/18/2020   Paroxysmal nocturnal dyspnea 05/14/2020   Orthopnea 05/14/2020   Acute conjunctivitis of both eyes 01/23/2020   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   Ingrown right greater toenail 07/19/2019   Status post hip replacement, left 07/19/2019   Left hip pain 07/19/2019   OSA on CPAP 05/20/2019   Snoring 05/02/2019   Non-restorative sleep  05/02/2019   Migraine without aura and without status migrainosus, not intractable 02/08/2019   OAB (overactive bladder) 02/08/2019   Diabetes mellitus without complication (HCC) 02/07/2019   Hyperlipidemia LDL goal <70 11/29/2018   DJD (degenerative joint disease), lumbosacral 11/29/2018   Pre-operative clearance 11/29/2018   Nasal dryness 02/06/2018   Arthralgia 02/05/2018   Rhinorrhea 01/04/2018   Excessive sweating 09/22/2017   Tongue mass 08/14/2017   Angina, class III (HCC) 07/07/2017   S/P cardiac cath 07/07/2017   CAD S/P percutaneous coronary angioplasty    Pneumothorax 06/25/2017   Chest tightness 05/24/2017   Itchy scalp 05/24/2017   Right leg pain 02/06/2017   Chronic tension-type headache, intractable 02/05/2017   Prediabetes 02/05/2017   Lump in throat 01/27/2017   Dysphagia 01/27/2017   No energy 09/15/2016   Bad taste in mouth 07/26/2016   Paresthesia 04/03/2016   Facet hypertrophy of lumbosacral region 04/03/2016   Chronic back pain 04/03/2016   Right knee pain 02/19/2016   Rosacea 02/04/2016   Vaginal atrophy 01/31/2016   Idiopathic scoliosis 01/22/2016   Hip bursitis 12/25/2015   Biceps tendonitis on right 12/25/2015   Diverticulitis of colon 12/21/2015   GERD (gastroesophageal reflux disease) 11/27/2015   Osteoporosis 11/27/2015   GAD (generalized anxiety disorder) 11/27/2015   MDD (major depressive disorder), recurrent, in full remission (HCC) 11/27/2015   Chronic dryness of both eyes 11/27/2015   Acquired hypothyroidism 11/27/2015   Asthma, chronic 11/27/2015   Insomnia 11/27/2015   Seborrheic keratoses 11/27/2015   CAD in native artery 11/27/2015    PCP: Sandy Crumb, PA REFERRING PROVIDER: Wilhelmenia Harada, MD REFERRING DIAG:  Diagnosis  M25.851 (ICD-10-CM) - Right hip impingement syndrome   THERAPY DIAG:  Muscle weakness (generalized)  Pain in right hip  Other abnormalities of gait and mobility  Pain in joint of right  hand  Rationale for Evaluation and Treatment: Rehabilitation  ONSET DATE: Date of Surgery: 07/30/2023   SUBJECTIVE:  SUBJECTIVE STATEMENT: Patient reports she is not sleeping well due to bladder issues; states she is more sore at R SIJ.   EVAL: The patient has a multi-year h/o R hip pain-- and underwent R hip arthroscopy with pincer debridement and labral repair. She presents s/p surgery with a standard walker (using the one she had at home) so she is carrying the device. She does not have a brace for night (verified with Vance Gell from Dr. Verline Glow office  to ensure this is correct). Patient has pain that is worse when laying down.   UPCOMING PROCEDURE: patient is having botox in her bladder and demonstrates she will need to be able to lift her R leg minimally during procedure.   PERTINENT HISTORY: Diabetes, HTN, hearing loss, CAD, h/o SI surgery in 2023 (pin) PAIN:  Are you having pain? Yes: NPRS scale: 5/10,  low back pain (R SI region) is a 7/10 today Pain location: R hip front/back Pain description: soreness, aching Aggravating factors: laying down-- hard to get comfortable Relieving factors: unsure  PRECAUTIONS: Other: hip arthroscopic procedure   *on 5/23, able to progress to post 3 weeks and begin further progression into protocol*  WEIGHT BEARING RESTRICTIONS: Yes WBAT  FALLS:  Has patient fallen in last 6 months? No  LIVING ENVIRONMENT: Lives with: lives with their spouse Lives in: House/apartment Stairs: one step entry- level indoors Has following equipment at home: standard walker-- carries the walker as she moves throughut clinic  PATIENT GOALS: reduce hip pain, return to daily activities  NEXT MD VISIT: 09/11/23  OBJECTIVE:  Note: Objective measures were completed at Evaluation unless otherwise noted. PATIENT SURVEYS:  LEFS 13.8%   COGNITION: Overall cognitive status: Within functional limits for tasks assessed     SENSATION: WFL  DRESSING:   patient had  allergy to waterproof bandage-- she has coban on and therefore is not showering this wee  POSTURE: h/o scoliosis with R rib hump in thoracic spine, C curve concave to the L, and hip asymmetry-- L hip elevated as compared to the R hip   LOWER EXTREMITY ROM: *when patient moves into supine, she goes into end range ER and has >60 deg bilat. PT educated her that we want to prop with a pillow at night to prevent excessive hip ER Passive ROM Right eval Left eval  Hip flexion 90   Hip extension 0   Hip abduction 30   Hip adduction    Hip internal rotation    Hip external rotation *excessive Goes into 60+ deg in supine-- PT educated to limit to 20 degrees using pillow prop    Knee flexion    Knee extension    Ankle dorsiflexion    Ankle plantarflexion    Ankle inversion    Ankle eversion     (Blank rows = not tested)  LOWER EXTREMITY MMT: Deferred due to post surgical status MMT Right eval Left eval  Hip flexion    Hip extension    Hip abduction    Hip adduction    Hip internal rotation    Hip external rotation    Knee flexion    Knee extension    Ankle dorsiflexion    Ankle plantarflexion    Ankle inversion    Ankle eversion     (Blank rows = not tested)  FUNCTIONAL TESTS:  Sit<>stand independently to device without UE support  GAIT: Distance walked: 50 ft Assistive device utilized: standard walker Level of assistance: Modified independence Comments: 2.11 ft/sec *PT switched patient out to RW so she is not carrying the standard walker. She is WBAT   [facility] Adult PT Treatment:                                                DATE: 08/26/2023 Therapeutic Exercise: Self-massage with 4" ball on R SI area Rt hip PROM  Side stepping  Backwards walking Manual Therapy: IASTM Rt gluteals/SI region Neuromuscular re-ed: Bent knee fall out (supine) Bridges 2x10 S/L clamshells & reverse clamshells 2x10 each Standing diagonal reaches with 2#MB x10 each Staggered stance (Rt  foot forward) with box trace holding 3#MB x6 each way    [facility] Adult PT Treatment:  DATE: 08/21/23 Therapeutic Exercise: R hip PROM per protocol Standing Knee flexion R x 10 reps Hip IR/ER with knee propped on stool Pelvic tilts against wall (she requested not to do quadriped due to discomfort last visit) Supine Lumbar small range rocking Hooklying glut set x 5 reps Modified thomas stretch (R leg straight) Hip ad and abduction hooklying isometrics x 5 reps each direction Manual Therapy: Self massage R glut, IT band IASTM and STM R glut med, glut max, and IT Band Neuromuscular re-ed: Single leg stance and R weight shifting Gait: Gait observation with continued limping-- however notes x years due to scoliosis -- patient notes no pain with walking today without device. Stair negotiation x 6" steps x 8 reps with step to pattern.   [facility] Adult PT Treatment:                                                DATE: 5/202/2025 Therapeutic Exercise: Rt hip PROM per protocol Stair navigation --> 6" steps + 1 railing Standing:  PPT Rt knee flexion PROM Manual Therapy: IASTM Rt gluteals/SI region Neuromuscular re-ed: Bent knee fall out x10 (bil) Side Lying: Clamshells 2x10 Reverse clamshells 2x10 Quadruped: Rocking Pelvic tilts Self Care: Review of protocol and remaining compliant   PATIENT EDUCATION:  Education details: HEP,  Person educated: Patient Education method: Programmer, multimedia, Facilities manager, and Handouts Education comprehension: verbalized understanding, returned demonstration, and needs further education  HOME EXERCISE PROGRAM: Access Code: [email or web address] URL: [email or web address] Date: 08/26/2023 Prepared by: Sims Duck  Program Notes Avoid sitting for more than 20-30 minutes at a time.Since you are not comfortable on your stomach, lay flat with legs out (prop a folded pillow beside the R foot to prevent it  turning out) for 15 minutes, 4 times/day to ensure the hip does not get tight.Do not bend past 90 degrees and AVOID straight leg raise.All exercises should be pain free.  Exercises - Heel Raises with Counter Support  - 2 x daily - 7 x weekly - 1 sets - 10 reps - Standing Alternating Knee Flexion  - 2 x daily - 7 x weekly - 1 sets - 10 reps - Modified Cat Cow on Counter  - 1 x daily - 7 x weekly - 3 sets - 10 reps - Standing Glute Med Mobilization with Small Ball on Wall  - 2 x daily - 5 x weekly - 1 sets - 10 reps - Standing Knee Flexion AROM with Chair Support  - 1 x daily - 7 x weekly - 3 sets - 10 reps - Supine Bridge  - 1 x daily - 7 x weekly - 3 sets - 10 reps  ASSESSMENT: CLINICAL IMPRESSION: Exercises progressed per protocol, incorporating bridges, mini squats and multi-directional walking. Prone exercises modified in standing per patient request. Kneeling exercises for shoulder girdle and core strengthening modified in standing due to patient no tolerating weight bearing on knees.   EVAL: Patient is a 68 y.o. female who was seen today for physical therapy evaluation and treatment S/P R hip arthroscopic surgery. She presents with impairments in hip ROM, muscle strength, functional mobility, gait speed, and balance. She also has h/o scoliosis which leads to asymmetry. Patient presents today with excessive ROM into ER and PT reviewed limitations of ROM at this time. Patient will benefit from PT services to improve strength, progress ambulation, and return  to ADLs and IADLs.  OBJECTIVE IMPAIRMENTS: Abnormal gait, decreased activity tolerance, decreased balance, decreased endurance, decreased ROM, decreased strength, hypomobility, increased fascial restrictions, impaired flexibility, postural dysfunction, and pain.   GOALS: Goals reviewed with patient? Yes  SHORT TERM GOALS: Target date: 09/04/23    Patient will be independent and compliant with initial HEP.    Baseline: issued at eval   Goal status: INITIAL   2.  Patient will demonstrate step through gait pattern without AD.  Baseline: step to with crutches Goal status: MET on 08/21/23   3.  Patient will demonstrate at least 90 degrees of active hip flexion ROM to improve ability to complete squatting activity.  Baseline: see PROM above  Goal status: INITIAL   4.  Patient will be able to ascend/descend stairs with reciprocal pattern. Baseline: step to with crutches  Goal status: INITIAL   LONG TERM GOALS: Target date: 10/04/23     Patient will score >/= 40% on the LEFS (MCID is 9) to signify clinically meaningful improvement in functional abilities.    Baseline: 13.8%  Goal status: INITIAL   2.  Patient will demonstrate at least 4/5 R hip strength to improve stability with walking/standing activity.  Baseline: deferred  Goal status: INITIAL   3.  Patient will demonstrate functional and pain free R hip ROM.  Baseline: see PROM above  Goal status: INITIAL   4.  Patient will be able to ambulate community distances without AD without hip pain.  Baseline: see gait above  Goal status: INITIAL   5.  Patient will be independent with advanced home program to progress/maintain current level of function.  Baseline: initial HEP issued  Goal status: INITIAL   6.  Patient will maintain SLS on the RLE for at least 10 seconds to improve gait stability.  Baseline: unable  Goal status: INITIAL  PLAN:  PT FREQUENCY: 2x/week  PT DURATION: 8 weeks  PLANNED INTERVENTIONS: 97164- PT Re-evaluation, 97110-Therapeutic exercises, 97530- Therapeutic activity, 97112- Neuromuscular re-education, 97535- Self Care, [id number]- Manual therapy, [phone]- Gait training, Patient/Family education, Balance training, Taping, Dry Needling, Cryotherapy, and Moist heat  PLAN FOR NEXT SESSION: Follow-up on new HEP (bridges & HS curls) & 4" massage ball; progress via protocol -- CHECK STGS soon.   Flint Hummer, PTA 08/26/2023, 2:41 PM

## 2023-08-28 ENCOUNTER — Encounter (HOSPITAL_BASED_OUTPATIENT_CLINIC_OR_DEPARTMENT_OTHER): Admitting: Orthopaedic Surgery

## 2023-08-28 ENCOUNTER — Ambulatory Visit

## 2023-08-28 DIAGNOSIS — M6281 Muscle weakness (generalized): Secondary | ICD-10-CM

## 2023-08-28 DIAGNOSIS — M25551 Pain in right hip: Secondary | ICD-10-CM

## 2023-08-28 DIAGNOSIS — R2689 Other abnormalities of gait and mobility: Secondary | ICD-10-CM

## 2023-08-28 NOTE — Therapy (Signed)
 OUTPATIENT PHYSICAL THERAPY LOWER EXTREMITY TREATMENT  Patient Name: Leslie Roberson MRN: [id number] DOB:01/10/1956, 68 y.o., female Today's Date: 08/28/2023  END OF SESSION:  PT End of Session - 08/28/23 1105     Visit Number 6    Number of Visits 16    Date for PT Re-Evaluation 10/04/23    Authorization Type [employer]    PT Start Time 1105    PT Stop Time 1145    PT Time Calculation (min) 40 min    Activity Tolerance Patient tolerated treatment well    Behavior During Therapy WFL for tasks assessed/performed             Past Medical History:  Diagnosis Date   Allergy 1992   Anxiety    Arthritis    "right little finger; base of thumb left hand; spine" (07/07/2017)   Asthma, chronic 11/27/2015   Cataract 2021   Chronic back pain    Coronary artery disease    4/19 PCI/DEx2 to LAD, and PCI/DES x1 to RCA, normal EF   Depression    Diabetes mellitus without complication (HCC) 2020   Diverticulitis of colon 12/21/2015   Colonoscopy every 5 years/digestive health.    GERD (gastroesophageal reflux disease) 11/27/2015   History of blood transfusion    "related to spinal fusions"   Hyperlipidemia LDL goal <70 11/29/2018   Hypertension    Hypothyroidism    Idiopathic scoliosis 01/22/2016   Osteoporosis 11/27/2015   On prolia .    Pneumonia 2016   Pneumothorax 06/26/2017   Pre-diabetes    Sleep apnea 2020   does not use CPAP   Thyroid  disease    Past Surgical History:  Procedure Laterality Date   ABDOMINAL HYSTERECTOMY     bladder mesh     S/P bladder sling"   COLON SURGERY  2007   CORONARY PRESSURE/FFR STUDY N/A 07/07/2017   Procedure: INTRAVASCULAR PRESSURE WIRE/FFR STUDY;  Surgeon: Arleen Lacer, MD;  Location: [facility] INVASIVE CV LAB;  Service: Cardiovascular;  Laterality: N/A;   CORONARY STENT INTERVENTION N/A 07/07/2017   Procedure: CORONARY STENT INTERVENTION;  Surgeon: Arleen Lacer, MD;  Location: [facility] INVASIVE CV LAB;  Service: Cardiovascular;   Laterality: N/A;   ELBOW SURGERY Right    "dislocation"   HIP ARTHROSCOPY Right 07/30/2023   Procedure: ARTHROSCOPY HIP;  Surgeon: Wilhelmenia Harada, MD;  Location: [facility];  Service: Orthopedics;  Laterality: Right;  RIGHT HIP ARTHROSCOPY WITH PINCER DEBRIDEMENT   INCONTINENCE SURGERY     "didn't work"   JOINT REPLACEMENT     LEFT HEART CATH AND CORONARY ANGIOGRAPHY N/A 07/07/2017   Procedure: LEFT HEART CATH AND CORONARY ANGIOGRAPHY;  Surgeon: Arleen Lacer, MD;  Location: [facility] INVASIVE CV LAB;  Service: Cardiovascular;  Laterality: N/A;   SMALL INTESTINE SURGERY     SPINAL FUSION  ~ 1970 X 3   "below neck - lower back"   TONSILLECTOMY     TOTAL HIP ARTHROPLASTY Left    Patient Active Problem List   Diagnosis Date Noted   Right hip impingement syndrome 07/30/2023   Chest congestion 06/03/2023   Toe blister without infection, left, initial encounter 06/02/2023   Nasal congestion 12/31/2022   Fever and chills 12/26/2022   Routine health maintenance 09/08/2022   Need for antibiotic prophylaxis for dental procedure 08/05/2022   Sinobronchitis 07/29/2022   Community acquired pneumonia of left lower lobe of lung 12/11/2021   Closed dislocation of metacarpophalangeal joint of right thumb 11/18/2021  Mass of right axilla 05/15/2021   Ear fullness, left 04/08/2021   Bilateral hearing loss 04/05/2021   Impacted cerumen, left ear 04/05/2021   Controlled type 2 diabetes mellitus with complication, without long-term current use of insulin (HCC) 11/16/2020   Right hand pain 11/16/2020   Fall 11/16/2020   Cough 05/18/2020   Paroxysmal nocturnal dyspnea 05/14/2020   Orthopnea 05/14/2020   Acute conjunctivitis of both eyes 01/23/2020   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   Ingrown right greater toenail 07/19/2019   Status post hip replacement, left 07/19/2019   Left hip pain 07/19/2019   OSA on CPAP 05/20/2019   Snoring 05/02/2019   Non-restorative sleep  05/02/2019   Migraine without aura and without status migrainosus, not intractable 02/08/2019   OAB (overactive bladder) 02/08/2019   Diabetes mellitus without complication (HCC) 02/07/2019   Hyperlipidemia LDL goal <70 11/29/2018   DJD (degenerative joint disease), lumbosacral 11/29/2018   Pre-operative clearance 11/29/2018   Nasal dryness 02/06/2018   Arthralgia 02/05/2018   Rhinorrhea 01/04/2018   Excessive sweating 09/22/2017   Tongue mass 08/14/2017   Angina, class III (HCC) 07/07/2017   S/P cardiac cath 07/07/2017   CAD S/P percutaneous coronary angioplasty    Pneumothorax 06/25/2017   Chest tightness 05/24/2017   Itchy scalp 05/24/2017   Right leg pain 02/06/2017   Chronic tension-type headache, intractable 02/05/2017   Prediabetes 02/05/2017   Lump in throat 01/27/2017   Dysphagia 01/27/2017   No energy 09/15/2016   Bad taste in mouth 07/26/2016   Paresthesia 04/03/2016   Facet hypertrophy of lumbosacral region 04/03/2016   Chronic back pain 04/03/2016   Right knee pain 02/19/2016   Rosacea 02/04/2016   Vaginal atrophy 01/31/2016   Idiopathic scoliosis 01/22/2016   Hip bursitis 12/25/2015   Biceps tendonitis on right 12/25/2015   Diverticulitis of colon 12/21/2015   GERD (gastroesophageal reflux disease) 11/27/2015   Osteoporosis 11/27/2015   GAD (generalized anxiety disorder) 11/27/2015   MDD (major depressive disorder), recurrent, in full remission (HCC) 11/27/2015   Chronic dryness of both eyes 11/27/2015   Acquired hypothyroidism 11/27/2015   Asthma, chronic 11/27/2015   Insomnia 11/27/2015   Seborrheic keratoses 11/27/2015   CAD in native artery 11/27/2015    PCP: Sandy Crumb, PA REFERRING PROVIDER: Wilhelmenia Harada, MD REFERRING DIAG:  Diagnosis  M25.851 (ICD-10-CM) - Right hip impingement syndrome   THERAPY DIAG:  Muscle weakness (generalized)  Pain in right hip  Other abnormalities of gait and mobility  Rationale for Evaluation and  Treatment: Rehabilitation  ONSET DATE: Date of Surgery: 07/30/2023   SUBJECTIVE:  SUBJECTIVE STATEMENT: Patient reports 4" soft massage ball helped with Rt SI pain. Patient reports no anterior hip pain.  EVAL: The patient has a multi-year h/o R hip pain-- and underwent R hip arthroscopy with pincer debridement and labral repair. She presents s/p surgery with a standard walker (using the one she had at home) so she is carrying the device. She does not have a brace for night (verified with Vance Gell from Dr. Verline Glow office  to ensure this is correct). Patient has pain that is worse when laying down.   UPCOMING PROCEDURE: patient is having botox in her bladder and demonstrates she will need to be able to lift her R leg minimally during procedure.   PERTINENT HISTORY: Diabetes, HTN, hearing loss, CAD, h/o SI surgery in 2023 (pin) PAIN:  Are you having pain? Yes: NPRS scale: 5/10, low back pain (R SI region) is a 7/10 today  Pain location: R hip front/back Pain description: soreness, aching Aggravating factors: laying down-- hard to get comfortable Relieving factors: unsure  PRECAUTIONS: Other: hip arthroscopic procedure   *on 5/23, able to progress to post 3 weeks and begin further progression into protocol*  WEIGHT BEARING RESTRICTIONS: Yes WBAT  FALLS:  Has patient fallen in last 6 months? No  LIVING ENVIRONMENT: Lives with: lives with their spouse Lives in: House/apartment Stairs: one step entry- level indoors Has following equipment at home: standard walker-- carries the walker as she moves throughut clinic  PATIENT GOALS: reduce hip pain, return to daily activities  NEXT MD VISIT: 09/11/23  OBJECTIVE:  Note: Objective measures were completed at Evaluation unless otherwise noted. PATIENT SURVEYS:  LEFS 13.8%   COGNITION: Overall cognitive status: Within functional limits for tasks assessed     SENSATION: WFL  DRESSING:   patient had allergy to waterproof bandage-- she  has coban on and therefore is not showering this wee  POSTURE: h/o scoliosis with R rib hump in thoracic spine, C curve concave to the L, and hip asymmetry-- L hip elevated as compared to the R hip   LOWER EXTREMITY ROM: *when patient moves into supine, she goes into end range ER and has >60 deg bilat. PT educated her that we want to prop with a pillow at night to prevent excessive hip ER Passive ROM Right eval Left eval  Hip flexion 90   Hip extension 0   Hip abduction 30   Hip adduction    Hip internal rotation    Hip external rotation *excessive Goes into 60+ deg in supine-- PT educated to limit to 20 degrees using pillow prop    Knee flexion    Knee extension    Ankle dorsiflexion    Ankle plantarflexion    Ankle inversion    Ankle eversion     (Blank rows = not tested)  LOWER EXTREMITY MMT: Deferred due to post surgical status MMT Right eval Left eval  Hip flexion    Hip extension    Hip abduction    Hip adduction    Hip internal rotation    Hip external rotation    Knee flexion    Knee extension    Ankle dorsiflexion    Ankle plantarflexion    Ankle inversion    Ankle eversion     (Blank rows = not tested)  FUNCTIONAL TESTS:  Sit<>stand independently to device without UE support  GAIT: Distance walked: 50 ft Assistive device utilized: standard walker Level of assistance: Modified independence Comments: 2.11 ft/sec *PT switched patient out to RW so she is not carrying the standard walker. She is WBAT   [facility] Adult PT Treatment:                                                DATE: 08/28/2023 Therapeutic Exercise: Standing overhead reach stretch at wall S/L clamshells + weight of hand 2x10 S/L reverse clamshells 2x10 Neuromuscular re-ed: Slow bent knee fall out Hooklying breathing + TA activation  Standing pelvic tilts against wall Staggered stance (Rt fwd): Diagonal reaches + 1#MB Paloff press + alt small upper trunk rotation Self Care: Soft 4"  massage ball recommendation   [facility] Adult PT Treatment:  DATE: 08/26/2023 Therapeutic Exercise: Self-massage with 4" ball on R SI area Rt hip PROM  Side stepping  Backwards walking Manual Therapy: IASTM Rt gluteals/SI region Neuromuscular re-ed: Bent knee fall out (supine) Bridges 2x10 S/L clamshells & reverse clamshells 2x10 each Standing diagonal reaches with 2#MB x10 each Staggered stance (Rt foot forward) with box trace holding 3#MB x6 each way    [facility] Adult PT Treatment:                                                DATE: 08/21/23 Therapeutic Exercise: R hip PROM per protocol Standing Knee flexion R x 10 reps Hip IR/ER with knee propped on stool Pelvic tilts against wall (she requested not to do quadriped due to discomfort last visit) Supine Lumbar small range rocking Hooklying glut set x 5 reps Modified thomas stretch (R leg straight) Hip ad and abduction hooklying isometrics x 5 reps each direction Manual Therapy: Self massage R glut, IT band IASTM and STM R glut med, glut max, and IT Band Neuromuscular re-ed: Single leg stance and R weight shifting Gait: Gait observation with continued limping-- however notes x years due to scoliosis -- patient notes no pain with walking today without device. Stair negotiation x 6" steps x 8 reps with step to pattern.   PATIENT EDUCATION:  Education details: Updated HEP  Person educated: Patient Education method: Explanation, Demonstration, and Handouts Education comprehension: verbalized understanding, returned demonstration, and needs further education  HOME EXERCISE PROGRAM: Access Code: [email or web address] URL: [email or web address] Date: 08/26/2023 Prepared by: Sims Duck  Program Notes Avoid sitting for more than 20-30 minutes at a time.Since you are not comfortable on your stomach, lay flat with legs out (prop a folded pillow beside the R foot to prevent it turning  out) for 15 minutes, 4 times/day to ensure the hip does not get tight.Do not bend past 90 degrees and AVOID straight leg raise.All exercises should be pain free.  Exercises - Heel Raises with Counter Support  - 2 x daily - 7 x weekly - 1 sets - 10 reps - Standing Alternating Knee Flexion  - 2 x daily - 7 x weekly - 1 sets - 10 reps - Modified Cat Cow on Counter  - 1 x daily - 7 x weekly - 3 sets - 10 reps - Standing Glute Med Mobilization with Small Ball on Wall  - 2 x daily - 5 x weekly - 1 sets - 10 reps - Standing Knee Flexion AROM with Chair Support  - 1 x daily - 7 x weekly - 3 sets - 10 reps - Supine Bridge  - 1 x daily - 7 x weekly - 3 sets - 10 reps  ASSESSMENT: CLINICAL IMPRESSION: Standing stretch added to promote elongation through Rt anterior hip without increased hip extension. Extensive cueing provided to promote TA activation and application of abdominal bracing mechanics with supine and standing exercises. Recommendation provided on soft massage ball for home use to address ongoing posterior hip pain.  EVAL: Patient is a 68 y.o. female who was seen today for physical therapy evaluation and treatment S/P R hip arthroscopic surgery. She presents with impairments in hip ROM, muscle strength, functional mobility, gait speed, and balance. She also has h/o scoliosis which leads to asymmetry. Patient presents today with excessive ROM into ER and PT reviewed limitations of ROM at  this time. Patient will benefit from PT services to improve strength, progress ambulation, and return to ADLs and IADLs.  OBJECTIVE IMPAIRMENTS: Abnormal gait, decreased activity tolerance, decreased balance, decreased endurance, decreased ROM, decreased strength, hypomobility, increased fascial restrictions, impaired flexibility, postural dysfunction, and pain.   GOALS: Goals reviewed with patient? Yes  SHORT TERM GOALS: Target date: 09/04/23    Patient will be independent and compliant with initial HEP.     Baseline: issued at eval  Goal status: INITIAL   2.  Patient will demonstrate step through gait pattern without AD.  Baseline: step to with crutches Goal status: MET on 08/21/23   3.  Patient will demonstrate at least 90 degrees of active hip flexion ROM to improve ability to complete squatting activity.  Baseline: see PROM above  Goal status: INITIAL   4.  Patient will be able to ascend/descend stairs with reciprocal pattern. Baseline: step to with crutches  Goal status: INITIAL   LONG TERM GOALS: Target date: 10/04/23     Patient will score >/= 40% on the LEFS (MCID is 9) to signify clinically meaningful improvement in functional abilities.    Baseline: 13.8%  Goal status: INITIAL   2.  Patient will demonstrate at least 4/5 R hip strength to improve stability with walking/standing activity.  Baseline: deferred  Goal status: INITIAL   3.  Patient will demonstrate functional and pain free R hip ROM.  Baseline: see PROM above  Goal status: INITIAL   4.  Patient will be able to ambulate community distances without AD without hip pain.  Baseline: see gait above  Goal status: INITIAL   5.  Patient will be independent with advanced home program to progress/maintain current level of function.  Baseline: initial HEP issued  Goal status: INITIAL   6.  Patient will maintain SLS on the RLE for at least 10 seconds to improve gait stability.  Baseline: unable  Goal status: INITIAL  PLAN:  PT FREQUENCY: 2x/week  PT DURATION: 8 weeks  PLANNED INTERVENTIONS: 97164- PT Re-evaluation, 97110-Therapeutic exercises, 97530- Therapeutic activity, 97112- Neuromuscular re-education, 97535- Self Care, 16109- Manual therapy, [phone]- Gait training, Patient/Family education, Balance training, Taping, Dry Needling, Cryotherapy, and Moist heat  PLAN FOR NEXT SESSION: Progress via protocol -- CHECK STGS soon.   Flint Hummer, PTA 08/28/2023, 11:46 AM

## 2023-08-31 ENCOUNTER — Ambulatory Visit: Attending: Orthopaedic Surgery | Admitting: Rehabilitative and Restorative Service Providers"

## 2023-08-31 ENCOUNTER — Encounter: Payer: Self-pay | Admitting: Rehabilitative and Restorative Service Providers"

## 2023-08-31 DIAGNOSIS — M25551 Pain in right hip: Secondary | ICD-10-CM | POA: Diagnosis not present

## 2023-08-31 DIAGNOSIS — M25541 Pain in joints of right hand: Secondary | ICD-10-CM | POA: Insufficient documentation

## 2023-08-31 DIAGNOSIS — M6281 Muscle weakness (generalized): Secondary | ICD-10-CM | POA: Insufficient documentation

## 2023-08-31 DIAGNOSIS — R2689 Other abnormalities of gait and mobility: Secondary | ICD-10-CM | POA: Insufficient documentation

## 2023-08-31 NOTE — Therapy (Signed)
 OUTPATIENT PHYSICAL THERAPY LOWER EXTREMITY TREATMENT  Patient Name: Leslie Roberson MRN: [id number] DOB:06/09/1955, 68 y.o., female Today's Date: 08/31/2023  END OF SESSION:  PT End of Session - 08/31/23 1319     Visit Number 7    Number of Visits 16    Date for PT Re-Evaluation 10/04/23    Authorization Type [employer]    PT Start Time 1319    PT Stop Time 1358    PT Time Calculation (min) 39 min    Activity Tolerance Patient tolerated treatment well    Behavior During Therapy WFL for tasks assessed/performed            Past Medical History:  Diagnosis Date   Allergy 1992   Anxiety    Arthritis    "right little finger; base of thumb left hand; spine" (07/07/2017)   Asthma, chronic 11/27/2015   Cataract 2021   Chronic back pain    Coronary artery disease    4/19 PCI/DEx2 to LAD, and PCI/DES x1 to RCA, normal EF   Depression    Diabetes mellitus without complication (HCC) 2020   Diverticulitis of colon 12/21/2015   Colonoscopy every 5 years/digestive health.    GERD (gastroesophageal reflux disease) 11/27/2015   History of blood transfusion    "related to spinal fusions"   Hyperlipidemia LDL goal <70 11/29/2018   Hypertension    Hypothyroidism    Idiopathic scoliosis 01/22/2016   Osteoporosis 11/27/2015   On prolia .    Pneumonia 2016   Pneumothorax 06/26/2017   Pre-diabetes    Sleep apnea 2020   does not use CPAP   Thyroid  disease    Past Surgical History:  Procedure Laterality Date   ABDOMINAL HYSTERECTOMY     bladder mesh     S/P bladder sling"   COLON SURGERY  2007   CORONARY PRESSURE/FFR STUDY N/A 07/07/2017   Procedure: INTRAVASCULAR PRESSURE WIRE/FFR STUDY;  Surgeon: Arleen Lacer, MD;  Location: [facility] INVASIVE CV LAB;  Service: Cardiovascular;  Laterality: N/A;   CORONARY STENT INTERVENTION N/A 07/07/2017   Procedure: CORONARY STENT INTERVENTION;  Surgeon: Arleen Lacer, MD;  Location: [facility] INVASIVE CV LAB;  Service: Cardiovascular;   Laterality: N/A;   ELBOW SURGERY Right    "dislocation"   HIP ARTHROSCOPY Right 07/30/2023   Procedure: ARTHROSCOPY HIP;  Surgeon: Wilhelmenia Harada, MD;  Location: [facility];  Service: Orthopedics;  Laterality: Right;  RIGHT HIP ARTHROSCOPY WITH PINCER DEBRIDEMENT   INCONTINENCE SURGERY     "didn't work"   JOINT REPLACEMENT     LEFT HEART CATH AND CORONARY ANGIOGRAPHY N/A 07/07/2017   Procedure: LEFT HEART CATH AND CORONARY ANGIOGRAPHY;  Surgeon: Arleen Lacer, MD;  Location: [facility] INVASIVE CV LAB;  Service: Cardiovascular;  Laterality: N/A;   SMALL INTESTINE SURGERY     SPINAL FUSION  ~ 1970 X 3   "below neck - lower back"   TONSILLECTOMY     TOTAL HIP ARTHROPLASTY Left    Patient Active Problem List   Diagnosis Date Noted   Right hip impingement syndrome 07/30/2023   Chest congestion 06/03/2023   Toe blister without infection, left, initial encounter 06/02/2023   Nasal congestion 12/31/2022   Fever and chills 12/26/2022   Routine health maintenance 09/08/2022   Need for antibiotic prophylaxis for dental procedure 08/05/2022   Sinobronchitis 07/29/2022   Community acquired pneumonia of left lower lobe of lung 12/11/2021   Closed dislocation of metacarpophalangeal joint of right thumb 11/18/2021   Mass  of right axilla 05/15/2021   Ear fullness, left 04/08/2021   Bilateral hearing loss 04/05/2021   Impacted cerumen, left ear 04/05/2021   Controlled type 2 diabetes mellitus with complication, without long-term current use of insulin (HCC) 11/16/2020   Right hand pain 11/16/2020   Fall 11/16/2020   Cough 05/18/2020   Paroxysmal nocturnal dyspnea 05/14/2020   Orthopnea 05/14/2020   Acute conjunctivitis of both eyes 01/23/2020   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   Ingrown right greater toenail 07/19/2019   Status post hip replacement, left 07/19/2019   Left hip pain 07/19/2019   OSA on CPAP 05/20/2019   Snoring 05/02/2019   Non-restorative sleep  05/02/2019   Migraine without aura and without status migrainosus, not intractable 02/08/2019   OAB (overactive bladder) 02/08/2019   Diabetes mellitus without complication (HCC) 02/07/2019   Hyperlipidemia LDL goal <70 11/29/2018   DJD (degenerative joint disease), lumbosacral 11/29/2018   Pre-operative clearance 11/29/2018   Nasal dryness 02/06/2018   Arthralgia 02/05/2018   Rhinorrhea 01/04/2018   Excessive sweating 09/22/2017   Tongue mass 08/14/2017   Angina, class III (HCC) 07/07/2017   S/P cardiac cath 07/07/2017   CAD S/P percutaneous coronary angioplasty    Pneumothorax 06/25/2017   Chest tightness 05/24/2017   Itchy scalp 05/24/2017   Right leg pain 02/06/2017   Chronic tension-type headache, intractable 02/05/2017   Prediabetes 02/05/2017   Lump in throat 01/27/2017   Dysphagia 01/27/2017   No energy 09/15/2016   Bad taste in mouth 07/26/2016   Paresthesia 04/03/2016   Facet hypertrophy of lumbosacral region 04/03/2016   Chronic back pain 04/03/2016   Right knee pain 02/19/2016   Rosacea 02/04/2016   Vaginal atrophy 01/31/2016   Idiopathic scoliosis 01/22/2016   Hip bursitis 12/25/2015   Biceps tendonitis on right 12/25/2015   Diverticulitis of colon 12/21/2015   GERD (gastroesophageal reflux disease) 11/27/2015   Osteoporosis 11/27/2015   GAD (generalized anxiety disorder) 11/27/2015   MDD (major depressive disorder), recurrent, in full remission (HCC) 11/27/2015   Chronic dryness of both eyes 11/27/2015   Acquired hypothyroidism 11/27/2015   Asthma, chronic 11/27/2015   Insomnia 11/27/2015   Seborrheic keratoses 11/27/2015   CAD in native artery 11/27/2015    PCP: Sandy Crumb, PA REFERRING PROVIDER: Wilhelmenia Harada, MD REFERRING DIAG:  Diagnosis  M25.851 (ICD-10-CM) - Right hip impingement syndrome   THERAPY DIAG:  Muscle weakness (generalized)  Pain in right hip  Pain in joint of right hand  Other abnormalities of gait and  mobility  Rationale for Evaluation and Treatment: Rehabilitation  ONSET DATE: Date of Surgery: 07/30/2023   SUBJECTIVE:  SUBJECTIVE STATEMENT: Patient is continuing with R SI pain. She is still struggling with sleeping-- wakes with R SI pain.  EVAL: The patient has a multi-year h/o R hip pain-- and underwent R hip arthroscopy with pincer debridement and labral repair. She presents s/p surgery with a standard walker (using the one she had at home) so she is carrying the device. She does not have a brace for night (verified with Vance Gell from Dr. Verline Glow office  to ensure this is correct). Patient has pain that is worse when laying down.   UPCOMING PROCEDURE: patient is having botox in her bladder and demonstrates she will need to be able to lift her R leg minimally during procedure.   PERTINENT HISTORY: Diabetes, HTN, hearing loss, CAD, h/o SI surgery in 2023 (pin) PAIN:  Are you having pain? Yes: NPRS scale: 5/10 in R SI  Pain location: R hip front/back Pain description: soreness, aching Aggravating factors: laying down-- hard to get comfortable Relieving factors: unsure  PRECAUTIONS: Other: hip arthroscopic procedure   *on 5/23, able to progress to post 3 weeks and begin further progression into protocol*  WEIGHT BEARING RESTRICTIONS: Yes WBAT  FALLS:  Has patient fallen in last 6 months? No  LIVING ENVIRONMENT: Lives with: lives with their spouse Lives in: House/apartment Stairs: one step entry- level indoors Has following equipment at home: standard walker-- carries the walker as she moves throughut clinic  PATIENT GOALS: reduce hip pain, return to daily activities  NEXT MD VISIT: 09/11/23  OBJECTIVE:  Note: Objective measures were completed at Evaluation unless otherwise noted. PATIENT SURVEYS:  LEFS 13.8%   COGNITION: Overall cognitive status: Within functional limits for tasks assessed     SENSATION: WFL  DRESSING:   patient had allergy to waterproof bandage--  she has coban on and therefore is not showering this wee  POSTURE: h/o scoliosis with R rib hump in thoracic spine, C curve concave to the L, and hip asymmetry-- L hip elevated as compared to the R hip   LOWER EXTREMITY ROM: *when patient moves into supine, she goes into end range ER and has >60 deg bilat. PT educated her that we want to prop with a pillow at night to prevent excessive hip ER Passive ROM Right eval Left eval  Hip flexion 90   Hip extension 0   Hip abduction 30   Hip adduction    Hip internal rotation    Hip external rotation *excessive Goes into 60+ deg in supine-- PT educated to limit to 20 degrees using pillow prop    Knee flexion    Knee extension    Ankle dorsiflexion    Ankle plantarflexion    Ankle inversion    Ankle eversion     (Blank rows = not tested)  LOWER EXTREMITY MMT: Deferred due to post surgical status MMT Right eval Left eval  Hip flexion    Hip extension    Hip abduction    Hip adduction    Hip internal rotation    Hip external rotation    Knee flexion    Knee extension    Ankle dorsiflexion    Ankle plantarflexion    Ankle inversion    Ankle eversion     (Blank rows = not tested)  FUNCTIONAL TESTS:  Sit<>stand independently to device without UE support  GAIT: Distance walked: 50 ft Assistive device utilized: standard walker Level of assistance: Modified independence Comments: 2.11 ft/sec *PT switched patient out to RW so she is not carrying the standard walker. She is WBAT  [facility] Adult PT Treatment:                                                DATE: 08/31/23 Therapeutic Exercise: Prone (with 2 pillows under hips) Knee flexion R x 10 reps Gentle PROM hip IR/ER within protocol allowance Quadriped Rocking ant/posterior-- unable to tolerate position Supine Towel fold under R rib hump with deep breathing  Standing Sidestepping 10 ft x 4 reps Isometric ball press for core stability x 10 reps Marching with L UE support Hip  abduction x 10 reps R and L sides Posterior stepping and return to middle to initiate small range extension per protocol x 5 reps each side Step  ups x 5 reps to 4" surface R and L sides Single leg standing working on level pelvis Manual Therapy: STM R glut med Therapeutic Activity: Sleeping positions trialing R and L sidelying using pillows and partial sidelying-- unable to tolerate Weight shifting in standing performing rolling a ball up/down wall and working on trunk elongation  [facility] Adult PT Treatment:                                                DATE: 08/28/2023 Therapeutic Exercise: Standing overhead reach stretch at wall S/L clamshells + weight of hand 2x10 S/L reverse clamshells 2x10 Neuromuscular re-ed: Slow bent knee fall out Hooklying breathing + TA activation  Standing pelvic tilts against wall Staggered stance (Rt fwd): Diagonal reaches + 1#MB Paloff press + alt small upper trunk rotation Self Care: Soft 4" massage ball recommendation   [facility] Adult PT Treatment:                                                DATE: 08/26/2023 Therapeutic Exercise: Self-massage with 4" ball on R SI area Rt hip PROM  Side stepping  Backwards walking Manual Therapy: IASTM Rt gluteals/SI region Neuromuscular re-ed: Bent knee fall out (supine) Bridges 2x10 S/L clamshells & reverse clamshells 2x10 each Standing diagonal reaches with 2#MB x10 each Staggered stance (Rt foot forward) with box trace holding 3#MB x6 each way    [facility] Adult PT Treatment:                                                DATE: 08/21/23 Therapeutic Exercise: R hip PROM per protocol Standing Knee flexion R x 10 reps Hip IR/ER with knee propped on stool Pelvic tilts against wall (she requested not to do quadriped due to discomfort last visit) Supine Lumbar small range rocking Hooklying glut set x 5 reps Modified thomas stretch (R leg straight) Hip ad and abduction hooklying isometrics x 5 reps each  direction Manual Therapy: Self massage R glut, IT band IASTM and STM R glut med, glut max, and IT Band Neuromuscular re-ed: Single leg stance and R weight shifting Gait: Gait observation with continued limping-- however notes x years due to scoliosis -- patient notes no pain with walking today without device. Stair negotiation x 6" steps x 8 reps with step to pattern.   PATIENT EDUCATION:  Education details: Updated HEP  Person educated: Patient Education method: Explanation, Demonstration, and Handouts Education comprehension: verbalized understanding, returned demonstration, and needs further education  HOME EXERCISE PROGRAM: Access Code: [email or web address] URL: [email or web address] Date: 08/26/2023 Prepared by: Sims Duck  Program Notes Avoid sitting for more than 20-30 minutes at a time.Since you are not comfortable on your stomach, lay flat with legs out (prop a folded pillow beside the R foot to prevent it turning out) for 15 minutes, 4 times/day to ensure the hip does not get tight.Do not bend past 90 degrees and AVOID straight leg raise.All exercises should be pain free.  Exercises - Heel Raises with Counter Support  - 2 x daily - 7 x weekly - 1 sets -  10 reps - Standing Alternating Knee Flexion  - 2 x daily - 7 x weekly - 1 sets - 10 reps - Modified Cat Cow on Counter  - 1 x daily - 7 x weekly - 3 sets - 10 reps - Standing Glute Med Mobilization with Small Ball on Wall  - 2 x daily - 5 x weekly - 1 sets - 10 reps - Standing Knee Flexion AROM with Chair Support  - 1 x daily - 7 x weekly - 3 sets - 10 reps - Supine Bridge  - 1 x daily - 7 x weekly - 3 sets - 10 reps  ASSESSMENT: CLINICAL IMPRESSION: Standing stretch added to promote elongation through Rt anterior hip without increased hip extension. Extensive cueing provided to promote TA activation and application of abdominal bracing mechanics with supine and standing exercises. Recommendation provided on soft  massage ball for home use to address ongoing posterior hip pain.  EVAL: Patient is a 68 y.o. female who was seen today for physical therapy evaluation and treatment S/P R hip arthroscopic surgery. She presents with impairments in hip ROM, muscle strength, functional mobility, gait speed, and balance. She also has h/o scoliosis which leads to asymmetry. Patient presents today with excessive ROM into ER and PT reviewed limitations of ROM at this time. Patient will benefit from PT services to improve strength, progress ambulation, and return to ADLs and IADLs.  OBJECTIVE IMPAIRMENTS: Abnormal gait, decreased activity tolerance, decreased balance, decreased endurance, decreased ROM, decreased strength, hypomobility, increased fascial restrictions, impaired flexibility, postural dysfunction, and pain.   GOALS: Goals reviewed with patient? Yes  SHORT TERM GOALS: Target date: 09/04/23    Patient will be independent and compliant with initial HEP.    Baseline: issued at eval  Goal status: INITIAL   2.  Patient will demonstrate step through gait pattern without AD.  Baseline: step to with crutches Goal status: MET on 08/21/23   3.  Patient will demonstrate at least 90 degrees of active hip flexion ROM to improve ability to complete squatting activity.  Baseline: see PROM above  Goal status: INITIAL   4.  Patient will be able to ascend/descend stairs with reciprocal pattern. Baseline: step to with crutches  Goal status: INITIAL   LONG TERM GOALS: Target date: 10/04/23     Patient will score >/= 40% on the LEFS (MCID is 9) to signify clinically meaningful improvement in functional abilities.    Baseline: 13.8%  Goal status: INITIAL   2.  Patient will demonstrate at least 4/5 R hip strength to improve stability with walking/standing activity.  Baseline: deferred  Goal status: INITIAL   3.  Patient will demonstrate functional and pain free R hip ROM.  Baseline: see PROM above  Goal status:  INITIAL   4.  Patient will be able to ambulate community distances without AD without hip pain.  Baseline: see gait above  Goal status: INITIAL   5.  Patient will be independent with advanced home program to progress/maintain current level of function.  Baseline: initial HEP issued  Goal status: INITIAL   6.  Patient will maintain SLS on the RLE for at least 10 seconds to improve gait stability.  Baseline: unable  Goal status: INITIAL  PLAN:  PT FREQUENCY: 2x/week  PT DURATION: 8 weeks  PLANNED INTERVENTIONS: 97164- PT Re-evaluation, 97110-Therapeutic exercises, 97530- Therapeutic activity, 97112- Neuromuscular re-education, 97535- Self Care, 16109- Manual therapy, [phone]- Gait training, Patient/Family education, Balance training, Taping, Dry Needling, Cryotherapy, and Moist heat  PLAN FOR NEXT SESSION: Progress via protocol -- CHECK STGS soon.   Marlei Glomski, PT 08/31/2023, 3:27 PM

## 2023-09-01 ENCOUNTER — Ambulatory Visit: Admitting: Physician Assistant

## 2023-09-02 ENCOUNTER — Telehealth: Payer: Self-pay | Admitting: Physician Assistant

## 2023-09-02 ENCOUNTER — Encounter: Payer: Self-pay | Admitting: Physician Assistant

## 2023-09-02 ENCOUNTER — Ambulatory Visit (INDEPENDENT_AMBULATORY_CARE_PROVIDER_SITE_OTHER): Admitting: Physician Assistant

## 2023-09-02 VITALS — BP 122/78 | HR 70 | Temp 98.6°F | Ht <= 58 in | Wt 122.0 lb

## 2023-09-02 DIAGNOSIS — M810 Age-related osteoporosis without current pathological fracture: Secondary | ICD-10-CM

## 2023-09-02 DIAGNOSIS — N3281 Overactive bladder: Secondary | ICD-10-CM | POA: Diagnosis not present

## 2023-09-02 DIAGNOSIS — Z23 Encounter for immunization: Secondary | ICD-10-CM | POA: Diagnosis not present

## 2023-09-02 DIAGNOSIS — E118 Type 2 diabetes mellitus with unspecified complications: Secondary | ICD-10-CM

## 2023-09-02 DIAGNOSIS — Z7985 Long-term (current) use of injectable non-insulin antidiabetic drugs: Secondary | ICD-10-CM | POA: Diagnosis not present

## 2023-09-02 LAB — POCT GLYCOSYLATED HEMOGLOBIN (HGB A1C): Hemoglobin A1C: 6.1 % — AB (ref 4.0–5.6)

## 2023-09-02 MED ORDER — DENOSUMAB 60 MG/ML ~~LOC~~ SOSY: 60.0000 mg | PREFILLED_SYRINGE | Freq: Once | SUBCUTANEOUS | 0 refills | Status: AC

## 2023-09-02 MED ORDER — SYNJARDY XR 25-1000 MG PO TB24: 1.0000 | ORAL_TABLET | Freq: Every day | ORAL | 1 refills | Status: DC

## 2023-09-02 NOTE — Telephone Encounter (Signed)
 Pt is requesting to schedule the next prolia  injection. Can you check on Prolia  authorization?

## 2023-09-02 NOTE — Progress Notes (Signed)
 Established Patient Office Visit  Subjective   Patient ID: Leslie Roberson, female    DOB: [birth year]  Age: 68 y.o. MRN: [id number]  Chief Complaint  Patient presents with   Medical Management of Chronic Issues    HPI Leslie Roberson presents today for a 3 month check up. Her A1C is down at 6.1 today. She has not been able to exercise due to a recent hip replacement on May 5th but she is going to physical therapy 2x a week. She does not measure her glucose. She experiences hypoglycemic symptoms about once every 6 months if she skips a meal. She declines chest pain/pressure, palpitations and swelling of extremities.   She is experiencing constant urinary incontinence during the day and must get up multiple times at night to urinate. She is taking Tolterodine and Vesicare  and thinks they help a little bit. She is seeing a specialist for this who gave her Botox injections 3 weeks ago. This did not provide relief. She has another appointment with them on June 18th.   She received her Prolia  and a Covid booster today.    Review of Systems  All other systems reviewed and are negative.     Objective:      BP 122/78   Pulse 70   Temp 98.6 F (37 C) (Oral)   Ht 4\' 10"  (1.473 m)   Wt 122 lb (55.3 kg)   SpO2 99%   BMI 25.50 kg/m  BP Readings from Last 3 Encounters:  09/02/23 122/78  07/30/23 (!) 141/85  06/02/23 116/74   Wt Readings from Last 3 Encounters:  09/02/23 122 lb (55.3 kg)  07/30/23 121 lb 11.1 oz (55.2 kg)  06/02/23 123 lb (55.8 kg)      Physical Exam Constitutional:      Appearance: Normal appearance.  HENT:     Head: Normocephalic.  Cardiovascular:     Rate and Rhythm: Normal rate and regular rhythm.  Pulmonary:     Effort: Pulmonary effort is normal.     Breath sounds: Normal breath sounds.  Musculoskeletal:     Right lower leg: No edema.     Left lower leg: No edema.  Neurological:     General: No focal deficit present.     Mental Status: She is  alert and oriented to person, place, and time.  Psychiatric:        Mood and Affect: Mood normal.      Results for orders placed or performed in visit on 09/02/23  POCT HgB A1C  Result Value Ref Range   Hemoglobin A1C 6.1 (A) 4.0 - 5.6 %   HbA1c POC (<> result, manual entry)     HbA1c, POC (prediabetic range)     HbA1c, POC (controlled diabetic range)        Assessment & Plan:   Aaron AasAaron AasMary "Delayne Sanzo" was seen today for medical management of chronic issues.  Diagnoses and all orders for this visit:  Controlled type 2 diabetes mellitus with complication, without long-term current use of insulin (HCC) -     POCT HgB A1C -     Empagliflozin-metFORMIN  HCl ER (SYNJARDY  XR) 25-1000 MG TB24; Take 1 tablet by mouth daily.  Age-related osteoporosis without current pathological fracture -     denosumab  (PROLIA ) 60 MG/ML SOSY injection; Inject 60 mg into the skin once for 1 dose.  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

## 2023-09-03 ENCOUNTER — Ambulatory Visit

## 2023-09-03 DIAGNOSIS — R2689 Other abnormalities of gait and mobility: Secondary | ICD-10-CM

## 2023-09-03 DIAGNOSIS — M25551 Pain in right hip: Secondary | ICD-10-CM

## 2023-09-03 DIAGNOSIS — M6281 Muscle weakness (generalized): Secondary | ICD-10-CM | POA: Diagnosis not present

## 2023-09-03 DIAGNOSIS — M25541 Pain in joints of right hand: Secondary | ICD-10-CM

## 2023-09-03 NOTE — Telephone Encounter (Signed)
 Please follow CAD Authorization workflow and place a CAM order to initiate Prolia  Benefits investigation. Standard turnaround time is 2 weeks.

## 2023-09-03 NOTE — Therapy (Signed)
 OUTPATIENT PHYSICAL THERAPY LOWER EXTREMITY TREATMENT  Patient Name: Leslie Roberson MRN: [id number] DOB:07-26-1955, 68 y.o., female Today's Date: 09/03/2023  END OF SESSION:  PT End of Session - 09/03/23 1319     Visit Number 8    Number of Visits 16    Date for PT Re-Evaluation 10/04/23    Authorization Type [employer]    PT Start Time 1318    PT Stop Time 1356    PT Time Calculation (min) 38 min    Activity Tolerance Patient tolerated treatment well    Behavior During Therapy WFL for tasks assessed/performed            Past Medical History:  Diagnosis Date   Allergy 1992   Anxiety    Arthritis    "right little finger; base of thumb left hand; spine" (07/07/2017)   Asthma, chronic 11/27/2015   Cataract 2021   Chronic back pain    Coronary artery disease    4/19 PCI/DEx2 to LAD, and PCI/DES x1 to RCA, normal EF   Depression    Diabetes mellitus without complication (HCC) 2020   Diverticulitis of colon 12/21/2015   Colonoscopy every 5 years/digestive health.    GERD (gastroesophageal reflux disease) 11/27/2015   History of blood transfusion    "related to spinal fusions"   Hyperlipidemia LDL goal <70 11/29/2018   Hypertension    Hypothyroidism    Idiopathic scoliosis 01/22/2016   Osteoporosis 11/27/2015   On prolia .    Pneumonia 2016   Pneumothorax 06/26/2017   Pre-diabetes    Sleep apnea 2020   does not use CPAP   Thyroid  disease    Past Surgical History:  Procedure Laterality Date   ABDOMINAL HYSTERECTOMY     bladder mesh     S/P bladder sling"   COLON SURGERY  2007   CORONARY PRESSURE/FFR STUDY N/A 07/07/2017   Procedure: INTRAVASCULAR PRESSURE WIRE/FFR STUDY;  Surgeon: Arleen Lacer, MD;  Location: [facility] INVASIVE CV LAB;  Service: Cardiovascular;  Laterality: N/A;   CORONARY STENT INTERVENTION N/A 07/07/2017   Procedure: CORONARY STENT INTERVENTION;  Surgeon: Arleen Lacer, MD;  Location: [facility] INVASIVE CV LAB;  Service: Cardiovascular;   Laterality: N/A;   ELBOW SURGERY Right    "dislocation"   HIP ARTHROSCOPY Right 07/30/2023   Procedure: ARTHROSCOPY HIP;  Surgeon: Wilhelmenia Harada, MD;  Location: [facility];  Service: Orthopedics;  Laterality: Right;  RIGHT HIP ARTHROSCOPY WITH PINCER DEBRIDEMENT   INCONTINENCE SURGERY     "didn't work"   JOINT REPLACEMENT     LEFT HEART CATH AND CORONARY ANGIOGRAPHY N/A 07/07/2017   Procedure: LEFT HEART CATH AND CORONARY ANGIOGRAPHY;  Surgeon: Arleen Lacer, MD;  Location: [facility] INVASIVE CV LAB;  Service: Cardiovascular;  Laterality: N/A;   SMALL INTESTINE SURGERY     SPINAL FUSION  ~ 1970 X 3   "below neck - lower back"   TONSILLECTOMY     TOTAL HIP ARTHROPLASTY Left    Patient Active Problem List   Diagnosis Date Noted   Right hip impingement syndrome 07/30/2023   Chest congestion 06/03/2023   Toe blister without infection, left, initial encounter 06/02/2023   Nasal congestion 12/31/2022   Fever and chills 12/26/2022   Routine health maintenance 09/08/2022   Need for antibiotic prophylaxis for dental procedure 08/05/2022   Sinobronchitis 07/29/2022   Community acquired pneumonia of left lower lobe of lung 12/11/2021   Closed dislocation of metacarpophalangeal joint of right thumb 11/18/2021   Mass  of right axilla 05/15/2021   Ear fullness, left 04/08/2021   Bilateral hearing loss 04/05/2021   Impacted cerumen, left ear 04/05/2021   Controlled type 2 diabetes mellitus with complication, without long-term current use of insulin (HCC) 11/16/2020   Right hand pain 11/16/2020   Fall 11/16/2020   Cough 05/18/2020   Paroxysmal nocturnal dyspnea 05/14/2020   Orthopnea 05/14/2020   Acute conjunctivitis of both eyes 01/23/2020   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   Ingrown right greater toenail 07/19/2019   Status post hip replacement, left 07/19/2019   Left hip pain 07/19/2019   OSA on CPAP 05/20/2019   Snoring 05/02/2019   Non-restorative sleep  05/02/2019   Migraine without aura and without status migrainosus, not intractable 02/08/2019   OAB (overactive bladder) 02/08/2019   Diabetes mellitus without complication (HCC) 02/07/2019   Hyperlipidemia LDL goal <70 11/29/2018   DJD (degenerative joint disease), lumbosacral 11/29/2018   Pre-operative clearance 11/29/2018   Nasal dryness 02/06/2018   Arthralgia 02/05/2018   Rhinorrhea 01/04/2018   Excessive sweating 09/22/2017   Tongue mass 08/14/2017   Angina, class III (HCC) 07/07/2017   S/P cardiac cath 07/07/2017   CAD S/P percutaneous coronary angioplasty    Pneumothorax 06/25/2017   Chest tightness 05/24/2017   Itchy scalp 05/24/2017   Right leg pain 02/06/2017   Chronic tension-type headache, intractable 02/05/2017   Prediabetes 02/05/2017   Lump in throat 01/27/2017   Dysphagia 01/27/2017   No energy 09/15/2016   Bad taste in mouth 07/26/2016   Paresthesia 04/03/2016   Facet hypertrophy of lumbosacral region 04/03/2016   Chronic back pain 04/03/2016   Right knee pain 02/19/2016   Rosacea 02/04/2016   Vaginal atrophy 01/31/2016   Idiopathic scoliosis 01/22/2016   Hip bursitis 12/25/2015   Biceps tendonitis on right 12/25/2015   Diverticulitis of colon 12/21/2015   GERD (gastroesophageal reflux disease) 11/27/2015   Osteoporosis 11/27/2015   GAD (generalized anxiety disorder) 11/27/2015   MDD (major depressive disorder), recurrent, in full remission (HCC) 11/27/2015   Chronic dryness of both eyes 11/27/2015   Acquired hypothyroidism 11/27/2015   Asthma, chronic 11/27/2015   Insomnia 11/27/2015   Seborrheic keratoses 11/27/2015   CAD in native artery 11/27/2015    PCP: Sandy Crumb, PA REFERRING PROVIDER: Wilhelmenia Harada, MD REFERRING DIAG:  Diagnosis  M25.851 (ICD-10-CM) - Right hip impingement syndrome   THERAPY DIAG:  Muscle weakness (generalized)  Pain in right hip  Pain in joint of right hand  Other abnormalities of gait and  mobility  Rationale for Evaluation and Treatment: Rehabilitation  ONSET DATE: Date of Surgery: 07/30/2023   SUBJECTIVE:  SUBJECTIVE STATEMENT: Patient reports less pain in R SIJ but it is still present.   EVAL: The patient has a multi-year h/o R hip pain-- and underwent R hip arthroscopy with pincer debridement and labral repair. She presents s/p surgery with a standard walker (using the one she had at home) so she is carrying the device. She does not have a brace for night (verified with Vance Gell from Dr. Verline Glow office  to ensure this is correct). Patient has pain that is worse when laying down.   UPCOMING PROCEDURE: patient is having botox in her bladder and demonstrates she will need to be able to lift her R leg minimally during procedure.   PERTINENT HISTORY: Diabetes, HTN, hearing loss, CAD, h/o SI surgery in 2023 (pin) PAIN:  Are you having pain? Yes: NPRS scale: 5/10 in R SI Pain location: R hip front/back  Pain description: soreness, aching Aggravating factors: laying down-- hard to get comfortable Relieving factors: unsure  PRECAUTIONS: Other: hip arthroscopic procedure   *on 5/23, able to progress to post 3 weeks and begin further progression into protocol*  WEIGHT BEARING RESTRICTIONS: Yes WBAT  FALLS:  Has patient fallen in last 6 months? No  LIVING ENVIRONMENT: Lives with: lives with their spouse Lives in: House/apartment Stairs: one step entry- level indoors Has following equipment at home: standard walker-- carries the walker as she moves throughut clinic  PATIENT GOALS: reduce hip pain, return to daily activities  NEXT MD VISIT: 09/11/23  OBJECTIVE:  Note: Objective measures were completed at Evaluation unless otherwise noted. PATIENT SURVEYS:  LEFS 13.8%   COGNITION: Overall cognitive status: Within functional limits for tasks assessed     SENSATION: WFL  DRESSING:   patient had allergy to waterproof bandage-- she has coban on and therefore is not  showering this wee  POSTURE: h/o scoliosis with R rib hump in thoracic spine, C curve concave to the L, and hip asymmetry-- L hip elevated as compared to the R hip   LOWER EXTREMITY ROM: *when patient moves into supine, she goes into end range ER and has >60 deg bilat. PT educated her that we want to prop with a pillow at night to prevent excessive hip ER Passive ROM Right eval Left eval  Hip flexion 90   Hip extension 0   Hip abduction 30   Hip adduction    Hip internal rotation    Hip external rotation *excessive Goes into 60+ deg in supine-- PT educated to limit to 20 degrees using pillow prop    Knee flexion    Knee extension    Ankle dorsiflexion    Ankle plantarflexion    Ankle inversion    Ankle eversion     (Blank rows = not tested)  LOWER EXTREMITY MMT: Deferred due to post surgical status MMT Right eval Left eval  Hip flexion    Hip extension    Hip abduction    Hip adduction    Hip internal rotation    Hip external rotation    Knee flexion    Knee extension    Ankle dorsiflexion    Ankle plantarflexion    Ankle inversion    Ankle eversion     (Blank rows = not tested)  FUNCTIONAL TESTS:  Sit<>stand independently to device without UE support  GAIT: Distance walked: 50 ft Assistive device utilized: standard walker Level of assistance: Modified independence Comments: 2.11 ft/sec *PT switched patient out to RW so she is not carrying the standard walker. She is WBAT   [facility] Adult PT Treatment:                                                DATE: 09/03/2023 Therapeutic Exercise: Prone (with 2 pillows under hips): Knee flexion R x 10 Gentle PROM hip IR/ER within protocol allowance HS curls + YTB (light) 2x10 Hip extension (neutral with pillow placement) 2x10 Lateral hip self-massage with 4" ball Neuromuscular re-ed: Standing hip abd --> focus on parallel LE alignment Rt SLB + HHAx1 Weight shifting in standing performing rolling a ball up/down wall  (Rt), YTB pull up (Lt) and working on trunk elongation Standing abdominal bracing + press down on orange PB --> standing with back against wall Modified 1/2 kneeling (  opp shin supported on low table) + alt diagonal reaches with 1#MB --> 0# ball Therapeutic Activity: Side stepping Diagonal step together, reciprocal stepping Step up/down reciprocal stepping on 4" step + bilateral railing   [facility] Adult PT Treatment:                                                DATE: 08/31/23 Therapeutic Exercise: Prone (with 2 pillows under hips) Knee flexion R x 10 reps Gentle PROM hip IR/ER within protocol allowance Quadriped Rocking ant/posterior-- unable to tolerate position Supine Towel fold under R rib hump with deep breathing  Standing Sidestepping 10 ft x 4 reps Isometric ball press for core stability x 10 reps Marching with L UE support Hip abduction x 10 reps R and L sides Posterior stepping and return to middle to initiate small range extension per protocol x 5 reps each side Step ups x 5 reps to 4" surface R and L sides Single leg standing working on level pelvis Manual Therapy: STM R glut med Therapeutic Activity: Sleeping positions trialing R and L sidelying using pillows and partial sidelying-- unable to tolerate Weight shifting in standing performing rolling a ball up/down wall and working on trunk elongation   [facility] Adult PT Treatment:                                                DATE: 08/28/2023 Therapeutic Exercise: Standing overhead reach stretch at wall S/L clamshells + weight of hand 2x10 S/L reverse clamshells 2x10 Neuromuscular re-ed: Slow bent knee fall out Hooklying breathing + TA activation  Standing pelvic tilts against wall Staggered stance (Rt fwd): Diagonal reaches + 1#MB Paloff press + alt small upper trunk rotation Self Care: Soft 4" massage ball recommendation   PATIENT EDUCATION:  Education details: Updated HEP  Person educated: Patient Education  method: Explanation, Demonstration, and Handouts Education comprehension: verbalized understanding, returned demonstration, and needs further education  HOME EXERCISE PROGRAM: Access Code: [email or web address] URL: [email or web address] Date: 08/26/2023 Prepared by: Sims Duck  Program Notes Avoid sitting for more than 20-30 minutes at a time.Since you are not comfortable on your stomach, lay flat with legs out (prop a folded pillow beside the R foot to prevent it turning out) for 15 minutes, 4 times/day to ensure the hip does not get tight.Do not bend past 90 degrees and AVOID straight leg raise.All exercises should be pain free.  Exercises - Heel Raises with Counter Support  - 2 x daily - 7 x weekly - 1 sets - 10 reps - Standing Alternating Knee Flexion  - 2 x daily - 7 x weekly - 1 sets - 10 reps - Modified Cat Cow on Counter  - 1 x daily - 7 x weekly - 3 sets - 10 reps - Standing Glute Med Mobilization with Small Ball on Wall  - 2 x daily - 5 x weekly - 1 sets - 10 reps - Standing Knee Flexion AROM with Chair Support  - 1 x daily - 7 x weekly - 3 sets - 10 reps - Supine Bridge  - 1 x daily - 7 x weekly - 3 sets - 10 reps  ASSESSMENT: CLINICAL IMPRESSION:  Hip mobility and strengthening exercises continued  per protocol. Weight shifting activities continued with cues for trunk elongation. Lateral hip fatigue/discomfort noted with reciprocal stepping on low step; recommended patient use heat/ice (personal preference) and massage balls to decrease tension.  EVAL: Patient is a 68 y.o. female who was seen today for physical therapy evaluation and treatment S/P R hip arthroscopic surgery. She presents with impairments in hip ROM, muscle strength, functional mobility, gait speed, and balance. She also has h/o scoliosis which leads to asymmetry. Patient presents today with excessive ROM into ER and PT reviewed limitations of ROM at this time. Patient will benefit from PT services to improve  strength, progress ambulation, and return to ADLs and IADLs.  OBJECTIVE IMPAIRMENTS: Abnormal gait, decreased activity tolerance, decreased balance, decreased endurance, decreased ROM, decreased strength, hypomobility, increased fascial restrictions, impaired flexibility, postural dysfunction, and pain.   GOALS: Goals reviewed with patient? Yes  SHORT TERM GOALS: Target date: 09/04/23    Patient will be independent and compliant with initial HEP.    Baseline: issued at eval  Goal status: MET   2.  Patient will demonstrate step through gait pattern without AD.  Baseline: step to with crutches Goal status: MET on 08/21/23   3.  Patient will demonstrate at least 90 degrees of active hip flexion ROM to improve ability to complete squatting activity.  Baseline: see PROM above 09/03/23: 90 degrees AROM Goal status: MET   4.  Patient will be able to ascend/descend stairs with reciprocal pattern. Baseline: step to with crutches  Goal status: MET   LONG TERM GOALS: Target date: 10/04/23     Patient will score >/= 40% on the LEFS (MCID is 9) to signify clinically meaningful improvement in functional abilities.    Baseline: 13.8%  09/03/23: 26/80 = 35.0% Goal status: IN PROGRESS   2.  Patient will demonstrate at least 4/5 R hip strength to improve stability with walking/standing activity.  Baseline: deferred  Goal status: INITIAL   3.  Patient will demonstrate functional and pain free R hip ROM.  Baseline: see PROM above  Goal status: INITIAL   4.  Patient will be able to ambulate community distances without AD without hip pain.  Baseline: see gait above  Goal status: INITIAL   5.  Patient will be independent with advanced home program to progress/maintain current level of function.  Baseline: initial HEP issued  Goal status: INITIAL   6.  Patient will maintain SLS on the RLE for at least 10 seconds to improve gait stability.  Baseline: unable  Goal status: INITIAL  PLAN:  PT  FREQUENCY: 2x/week  PT DURATION: 8 weeks  PLANNED INTERVENTIONS: 97164- PT Re-evaluation, 97110-Therapeutic exercises, 97530- Therapeutic activity, 97112- Neuromuscular re-education, 97535- Self Care, [id number]- Manual therapy, [phone]- Gait training, Patient/Family education, Balance training, Taping, Dry Needling, Cryotherapy, and Moist heat  PLAN FOR NEXT SESSION: Progress via protocol.   Flint Hummer, PTA 09/03/2023, 1:57 PM

## 2023-09-04 ENCOUNTER — Encounter: Payer: Self-pay | Admitting: Physician Assistant

## 2023-09-04 DIAGNOSIS — R944 Abnormal results of kidney function studies: Secondary | ICD-10-CM

## 2023-09-07 ENCOUNTER — Encounter: Payer: Self-pay | Admitting: Rehabilitative and Restorative Service Providers"

## 2023-09-07 ENCOUNTER — Ambulatory Visit: Admitting: Rehabilitative and Restorative Service Providers"

## 2023-09-07 DIAGNOSIS — M6281 Muscle weakness (generalized): Secondary | ICD-10-CM

## 2023-09-07 DIAGNOSIS — M25551 Pain in right hip: Secondary | ICD-10-CM

## 2023-09-07 DIAGNOSIS — M25541 Pain in joints of right hand: Secondary | ICD-10-CM

## 2023-09-07 DIAGNOSIS — R2689 Other abnormalities of gait and mobility: Secondary | ICD-10-CM

## 2023-09-07 NOTE — Therapy (Signed)
 OUTPATIENT PHYSICAL THERAPY LOWER EXTREMITY TREATMENT  Patient Name: Leslie Roberson MRN: [id number] DOB:Feb 06, 1956, 68 y.o., female Today's Date: 09/07/2023  END OF SESSION:  PT End of Session - 09/07/23 1322     Visit Number 9    Number of Visits 16    Date for PT Re-Evaluation 10/04/23    Authorization Type [employer]    PT Start Time 1320    PT Stop Time 1400    PT Time Calculation (min) 40 min    Activity Tolerance Patient tolerated treatment well    Behavior During Therapy WFL for tasks assessed/performed             Past Medical History:  Diagnosis Date   Allergy 1992   Anxiety    Arthritis    "right little finger; base of thumb left hand; spine" (07/07/2017)   Asthma, chronic 11/27/2015   Cataract 2021   Chronic back pain    Coronary artery disease    4/19 PCI/DEx2 to LAD, and PCI/DES x1 to RCA, normal EF   Depression    Diabetes mellitus without complication (HCC) 2020   Diverticulitis of colon 12/21/2015   Colonoscopy every 5 years/digestive health.    GERD (gastroesophageal reflux disease) 11/27/2015   History of blood transfusion    "related to spinal fusions"   Hyperlipidemia LDL goal <70 11/29/2018   Hypertension    Hypothyroidism    Idiopathic scoliosis 01/22/2016   Osteoporosis 11/27/2015   On prolia .    Pneumonia 2016   Pneumothorax 06/26/2017   Pre-diabetes    Sleep apnea 2020   does not use CPAP   Thyroid  disease    Past Surgical History:  Procedure Laterality Date   ABDOMINAL HYSTERECTOMY     bladder mesh     S/P bladder sling"   COLON SURGERY  2007   CORONARY PRESSURE/FFR STUDY N/A 07/07/2017   Procedure: INTRAVASCULAR PRESSURE WIRE/FFR STUDY;  Surgeon: Arleen Lacer, MD;  Location: [facility] INVASIVE CV LAB;  Service: Cardiovascular;  Laterality: N/A;   CORONARY STENT INTERVENTION N/A 07/07/2017   Procedure: CORONARY STENT INTERVENTION;  Surgeon: Arleen Lacer, MD;  Location: [facility] INVASIVE CV LAB;  Service: Cardiovascular;   Laterality: N/A;   ELBOW SURGERY Right    "dislocation"   HIP ARTHROSCOPY Right 07/30/2023   Procedure: ARTHROSCOPY HIP;  Surgeon: Wilhelmenia Harada, MD;  Location: [facility];  Service: Orthopedics;  Laterality: Right;  RIGHT HIP ARTHROSCOPY WITH PINCER DEBRIDEMENT   INCONTINENCE SURGERY     "didn't work"   JOINT REPLACEMENT     LEFT HEART CATH AND CORONARY ANGIOGRAPHY N/A 07/07/2017   Procedure: LEFT HEART CATH AND CORONARY ANGIOGRAPHY;  Surgeon: Arleen Lacer, MD;  Location: [facility] INVASIVE CV LAB;  Service: Cardiovascular;  Laterality: N/A;   SMALL INTESTINE SURGERY     SPINAL FUSION  ~ 1970 X 3   "below neck - lower back"   TONSILLECTOMY     TOTAL HIP ARTHROPLASTY Left    Patient Active Problem List   Diagnosis Date Noted   Right hip impingement syndrome 07/30/2023   Chest congestion 06/03/2023   Toe blister without infection, left, initial encounter 06/02/2023   Nasal congestion 12/31/2022   Fever and chills 12/26/2022   Routine health maintenance 09/08/2022   Need for antibiotic prophylaxis for dental procedure 08/05/2022   Sinobronchitis 07/29/2022   Community acquired pneumonia of left lower lobe of lung 12/11/2021   Closed dislocation of metacarpophalangeal joint of right thumb 11/18/2021  Mass of right axilla 05/15/2021   Ear fullness, left 04/08/2021   Bilateral hearing loss 04/05/2021   Impacted cerumen, left ear 04/05/2021   Controlled type 2 diabetes mellitus with complication, without long-term current use of insulin (HCC) 11/16/2020   Right hand pain 11/16/2020   Fall 11/16/2020   Cough 05/18/2020   Paroxysmal nocturnal dyspnea 05/14/2020   Orthopnea 05/14/2020   Acute conjunctivitis of both eyes 01/23/2020   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   Ingrown right greater toenail 07/19/2019   Status post hip replacement, left 07/19/2019   Left hip pain 07/19/2019   OSA on CPAP 05/20/2019   Snoring 05/02/2019   Non-restorative sleep  05/02/2019   Migraine without aura and without status migrainosus, not intractable 02/08/2019   OAB (overactive bladder) 02/08/2019   Diabetes mellitus without complication (HCC) 02/07/2019   Hyperlipidemia LDL goal <70 11/29/2018   DJD (degenerative joint disease), lumbosacral 11/29/2018   Pre-operative clearance 11/29/2018   Nasal dryness 02/06/2018   Arthralgia 02/05/2018   Rhinorrhea 01/04/2018   Excessive sweating 09/22/2017   Tongue mass 08/14/2017   Angina, class III (HCC) 07/07/2017   S/P cardiac cath 07/07/2017   CAD S/P percutaneous coronary angioplasty    Pneumothorax 06/25/2017   Chest tightness 05/24/2017   Itchy scalp 05/24/2017   Right leg pain 02/06/2017   Chronic tension-type headache, intractable 02/05/2017   Prediabetes 02/05/2017   Lump in throat 01/27/2017   Dysphagia 01/27/2017   No energy 09/15/2016   Bad taste in mouth 07/26/2016   Paresthesia 04/03/2016   Facet hypertrophy of lumbosacral region 04/03/2016   Chronic back pain 04/03/2016   Right knee pain 02/19/2016   Rosacea 02/04/2016   Vaginal atrophy 01/31/2016   Idiopathic scoliosis 01/22/2016   Hip bursitis 12/25/2015   Biceps tendonitis on right 12/25/2015   Diverticulitis of colon 12/21/2015   GERD (gastroesophageal reflux disease) 11/27/2015   Osteoporosis 11/27/2015   GAD (generalized anxiety disorder) 11/27/2015   MDD (major depressive disorder), recurrent, in full remission (HCC) 11/27/2015   Chronic dryness of both eyes 11/27/2015   Acquired hypothyroidism 11/27/2015   Asthma, chronic 11/27/2015   Insomnia 11/27/2015   Seborrheic keratoses 11/27/2015   CAD in native artery 11/27/2015    PCP: Sandy Crumb, PA REFERRING PROVIDER: Wilhelmenia Harada, MD REFERRING DIAG:  Diagnosis  M25.851 (ICD-10-CM) - Right hip impingement syndrome   THERAPY DIAG:  Muscle weakness (generalized)  Pain in right hip  Pain in joint of right hand  Other abnormalities of gait and  mobility  Rationale for Evaluation and Treatment: Rehabilitation  ONSET DATE: Date of Surgery: 07/30/2023   SUBJECTIVE:  SUBJECTIVE STATEMENT: Patient reports no pain in R SI joint, still gets some lateral pain in R hip and sensation of fatigue.  EVAL: The patient has a multi-year h/o R hip pain-- and underwent R hip arthroscopy with pincer debridement and labral repair. She presents s/p surgery with a standard walker (using the one she had at home) so she is carrying the device. She does not have a brace for night (verified with Vance Gell from Dr. Verline Glow office  to ensure this is correct). Patient has pain that is worse when laying down.   UPCOMING PROCEDURE: patient is having botox in her bladder and demonstrates she will need to be able to lift her R leg minimally during procedure.   PERTINENT HISTORY: Diabetes, HTN, hearing loss, CAD, h/o SI surgery in 2023 (pin) PAIN:  Are you having pain? Yes: NPRS scale: 5/10  in R  lateral hip Pain location: R hip front/back Pain description: soreness, aching Aggravating factors: laying down-- hard to get comfortable Relieving factors: unsure  PRECAUTIONS: Other: hip arthroscopic procedure   *on 5/23, able to progress to post 3 weeks and begin further progression into protocol*  WEIGHT BEARING RESTRICTIONS: Yes WBAT  FALLS:  Has patient fallen in last 6 months? No  LIVING ENVIRONMENT: Lives with: lives with their spouse Lives in: House/apartment Stairs: one step entry- level indoors Has following equipment at home: standard walker-- carries the walker as she moves throughut clinic  PATIENT GOALS: reduce hip pain, return to daily activities  NEXT MD VISIT: 09/11/23  OBJECTIVE:  Note: Objective measures were completed at Evaluation unless otherwise noted. PATIENT SURVEYS:  LEFS 13.8%   COGNITION: Overall cognitive status: Within functional limits for tasks assessed     SENSATION: WFL  DRESSING:   patient had allergy to  waterproof bandage-- she has coban on and therefore is not showering this wee  POSTURE: h/o scoliosis with R rib hump in thoracic spine, C curve concave to the L, and hip asymmetry-- L hip elevated as compared to the R hip   LOWER EXTREMITY ROM: *when patient moves into supine, she goes into end range ER and has >60 deg bilat. PT educated her that we want to prop with a pillow at night to prevent excessive hip ERr Passive ROM Right eval Left eval Right 09/07/23  Hip flexion 90  105 AROM  Hip extension 0    Hip abduction 30    Hip adduction     Hip internal rotation   20 deg PROM  Hip external rotation *excessive Goes into 60+ deg in supine-- PT educated to limit to 20 degrees using pillow prop     Knee flexion     Knee extension     Ankle dorsiflexion     Ankle plantarflexion     Ankle inversion     Ankle eversion      (Blank rows = not tested)  LOWER EXTREMITY MMT: Deferred due to post surgical status MMT Right eval Left eval  Hip flexion    Hip extension    Hip abduction    Hip adduction    Hip internal rotation    Hip external rotation    Knee flexion    Knee extension    Ankle dorsiflexion    Ankle plantarflexion    Ankle inversion    Ankle eversion     (Blank rows = not tested)  FUNCTIONAL TESTS:  Sit<>stand independently to device without UE support  GAIT: Distance walked: 50 ft Assistive device utilized: standard walker Level of assistance: Modified independence Comments: 2.11 ft/sec *PT switched patient out to RW so she is not carrying the standard walker. She is WBAT  [facility] Adult PT Treatment:                                                DATE: 09/07/23 Therapeutic Exercise: Sidelying Hip abduction x 10 reps x 2 sets Supine PROM hip IR/ER Quadriped  Anterior/posterior rocking Gets to 115 degrees of hip flexion with posterior rocking Standing Step ups lateral x 10 reps R side Anterior x 10 reps with fatigue lateral hip R side Reciprocal "up/up,  down/down" x 10 reps Press down into L sided pole (for L trunk elongation) while pulling  down with yellow band with R UE for core engagement Press down into L sided pole (for L trunk elongation) while rolling a ball with L LE  Prone (modified with 2 pillows under stomach) Knee flexion x 10 reps x 2 sets PROM hip IR/ER within tolerance (can get to 30 degrees IR in prone) Hip initiation of extension with toe down doing quad sets  x 10 reps x 2 sets Manual Therapy: STM R glut med while in prone Neuromuscular re-ed: Single leg standing R and L sides, decreasing reliance on Ues Marching with alternating holds for single leg support Therapeutic Activity: Marching with one UE support R and L Stepping into shorts-- had patient work on bending down with theraband to initiate dressing activities as this is a challenging task at home Standing in stride position reaching with contralateral UE overhead Gait: X 200 feet x 2 reps with cues for equal stride  [facility] Adult PT Treatment:                                                DATE: 09/03/2023 Therapeutic Exercise: Prone (with 2 pillows under hips): Knee flexion R x 10 Gentle PROM hip IR/ER within protocol allowance HS curls + YTB (light) 2x10 Hip extension (neutral with pillow placement) 2x10 Lateral hip self-massage with 4" ball Neuromuscular re-ed: Standing hip abd --> focus on parallel LE alignment Rt SLB + HHAx1 Weight shifting in standing performing rolling a ball up/down wall (Rt), YTB pull up (Lt) and working on trunk elongation Standing abdominal bracing + press down on orange PB --> standing with back against wall Modified 1/2 kneeling (opp shin supported on low table) + alt diagonal reaches with 1#MB --> 0# ball Therapeutic Activity: Side stepping Diagonal step together, reciprocal stepping Step up/down reciprocal stepping on 4" step + bilateral railing   [facility] Adult PT Treatment:                                                DATE:  08/31/23 Therapeutic Exercise: Prone (with 2 pillows under hips) Knee flexion R x 10 reps Gentle PROM hip IR/ER within protocol allowance Quadriped Rocking ant/posterior-- unable to tolerate position Supine Towel fold under R rib hump with deep breathing  Standing Sidestepping 10 ft x 4 reps Isometric ball press for core stability x 10 reps Marching with L UE support Hip abduction x 10 reps R and L sides Posterior stepping and return to middle to initiate small range extension per protocol x 5 reps each side Step ups x 5 reps to 4" surface R and L sides Single leg standing working on level pelvis Manual Therapy: STM R glut med Therapeutic Activity: Sleeping positions trialing R and L sidelying using pillows and partial sidelying-- unable to tolerate Weight shifting in standing performing rolling a ball up/down wall and working on trunk elongation   [facility] Adult PT Treatment:                                                DATE: 08/28/2023 Therapeutic Exercise: Standing overhead reach stretch at wall  S/L clamshells + weight of hand 2x10 S/L reverse clamshells 2x10 Neuromuscular re-ed: Slow bent knee fall out Hooklying breathing + TA activation  Standing pelvic tilts against wall Staggered stance (Rt fwd): Diagonal reaches + 1#MB Paloff press + alt small upper trunk rotation Self Care: Soft 4" massage ball recommendation   PATIENT EDUCATION:  Education details: Updated HEP  Person educated: Patient Education method: Explanation, Demonstration, and Handouts Education comprehension: verbalized understanding, returned demonstration, and needs further education  HOME EXERCISE PROGRAM: Access Code: [email or web address] URL: [email or web address] Date: 08/26/2023 Prepared by: Sims Duck  Program Notes Avoid sitting for more than 20-30 minutes at a time.Since you are not comfortable on your stomach, lay flat with legs out (prop a folded pillow beside the R foot to prevent  it turning out) for 15 minutes, 4 times/day to ensure the hip does not get tight.Do not bend past 90 degrees and AVOID straight leg raise.All exercises should be pain free.  Exercises - Heel Raises with Counter Support  - 2 x daily - 7 x weekly - 1 sets - 10 reps - Standing Alternating Knee Flexion  - 2 x daily - 7 x weekly - 1 sets - 10 reps - Modified Cat Cow on Counter  - 1 x daily - 7 x weekly - 3 sets - 10 reps - Standing Glute Med Mobilization with Small Ball on Wall  - 2 x daily - 5 x weekly - 1 sets - 10 reps - Standing Knee Flexion AROM with Chair Support  - 1 x daily - 7 x weekly - 3 sets - 10 reps - Supine Bridge  - 1 x daily - 7 x weekly - 3 sets - 10 reps  ASSESSMENT: CLINICAL IMPRESSION:  Patient feels she has returned to near baseline gait and functional mobility since surgery, with the exception of getting dressed -- stepping into shorts. PT and patient began working on this specific functional task today. PT continuing to progress towards LTGs focusing on strengthening in R hip. The patient has tightness into hip IR as compared to ER. She relies heavily on R LE for single leg control due to scoliosis and leaning to the L-- she has more difficulty maintaining L leg stance.   EVAL: Patient is a 68 y.o. female who was seen today for physical therapy evaluation and treatment S/P R hip arthroscopic surgery. She presents with impairments in hip ROM, muscle strength, functional mobility, gait speed, and balance. She also has h/o scoliosis which leads to asymmetry. Patient presents today with excessive ROM into ER and PT reviewed limitations of ROM at this time. Patient will benefit from PT services to improve strength, progress ambulation, and return to ADLs and IADLs.  OBJECTIVE IMPAIRMENTS: Abnormal gait, decreased activity tolerance, decreased balance, decreased endurance, decreased ROM, decreased strength, hypomobility, increased fascial restrictions, impaired flexibility, postural  dysfunction, and pain.   GOALS: Goals reviewed with patient? Yes  SHORT TERM GOALS: Target date: 09/04/23    Patient will be independent and compliant with initial HEP.    Baseline: issued at eval  Goal status: MET   2.  Patient will demonstrate step through gait pattern without AD.  Baseline: step to with crutches Goal status: MET on 08/21/23   3.  Patient will demonstrate at least 90 degrees of active hip flexion ROM to improve ability to complete squatting activity.  Baseline: see PROM above 09/03/23: 90 degrees AROM Goal status: MET   4.  Patient will be able to ascend/descend stairs with  reciprocal pattern. Baseline: step to with crutches  Goal status: MET   LONG TERM GOALS: Target date: 10/04/23     Patient will score >/= 40% on the LEFS (MCID is 9) to signify clinically meaningful improvement in functional abilities.    Baseline: 13.8%  09/03/23: 26/80 = 35.0% Goal status: IN PROGRESS   2.  Patient will demonstrate at least 4/5 R hip strength to improve stability with walking/standing activity.  Baseline: deferred  Goal status: INITIAL   3.  Patient will demonstrate functional and pain free R hip ROM.  Baseline: see PROM above  Goal status: INITIAL   4.  Patient will be able to ambulate community distances without AD without hip pain.  Baseline: see gait above  Goal status: INITIAL   5.  Patient will be independent with advanced home program to progress/maintain current level of function.  Baseline: initial HEP issued  Goal status: INITIAL   6.  Patient will maintain SLS on the RLE for at least 10 seconds to improve gait stability.  Baseline: unable  Goal status: INITIAL  PLAN:  PT FREQUENCY: 2x/week  PT DURATION: 8 weeks  PLANNED INTERVENTIONS: 97164- PT Re-evaluation, 97110-Therapeutic exercises, 97530- Therapeutic activity, 97112- Neuromuscular re-education, 97535- Self Care, 47829- Manual therapy, [phone]- Gait training, Patient/Family education, Balance  training, Taping, Dry Needling, Cryotherapy, and Moist heat  PLAN FOR NEXT SESSION: Progress via protocol. Sent note on 6/9 for 6/13 visit with Dr. Hermina Loosen.   Jniya Madara, PT 09/07/2023, 1:22 PM

## 2023-09-08 DIAGNOSIS — G8929 Other chronic pain: Secondary | ICD-10-CM | POA: Diagnosis not present

## 2023-09-08 DIAGNOSIS — M461 Sacroiliitis, not elsewhere classified: Secondary | ICD-10-CM | POA: Diagnosis not present

## 2023-09-08 DIAGNOSIS — M51379 Other intervertebral disc degeneration, lumbosacral region without mention of lumbar back pain or lower extremity pain: Secondary | ICD-10-CM | POA: Diagnosis not present

## 2023-09-08 DIAGNOSIS — M5126 Other intervertebral disc displacement, lumbar region: Secondary | ICD-10-CM | POA: Diagnosis not present

## 2023-09-08 DIAGNOSIS — M545 Low back pain, unspecified: Secondary | ICD-10-CM | POA: Diagnosis not present

## 2023-09-08 DIAGNOSIS — M51369 Other intervertebral disc degeneration, lumbar region without mention of lumbar back pain or lower extremity pain: Secondary | ICD-10-CM | POA: Diagnosis not present

## 2023-09-08 DIAGNOSIS — M47816 Spondylosis without myelopathy or radiculopathy, lumbar region: Secondary | ICD-10-CM | POA: Diagnosis not present

## 2023-09-08 MED ORDER — DENOSUMAB 60 MG/ML ~~LOC~~ SOSY: 60.0000 mg | PREFILLED_SYRINGE | SUBCUTANEOUS | Status: DC

## 2023-09-08 NOTE — Telephone Encounter (Signed)
 Vanicia, can you please assist with next prolia  authorization?

## 2023-09-08 NOTE — Addendum Note (Signed)
 Addended by: Ashika Apuzzo L on: 09/08/2023 05:16 PM   Modules accepted: Orders

## 2023-09-08 NOTE — Telephone Encounter (Signed)
 Please review previous TE from 07/15/23 - present. An authorization is on file. Patient OOP responsibility is $250. Patient has her Prolia  med delivered from [employer]. Injection given to patient during an office visit with provider by CMA Madilyn Schillings. The CMA did not order the next injection (6 months from 09/02/23) as needed. Task completed and referral linked.

## 2023-09-09 DIAGNOSIS — R944 Abnormal results of kidney function studies: Secondary | ICD-10-CM | POA: Diagnosis not present

## 2023-09-10 ENCOUNTER — Ambulatory Visit: Payer: Self-pay | Admitting: Physician Assistant

## 2023-09-10 ENCOUNTER — Ambulatory Visit

## 2023-09-10 DIAGNOSIS — M6281 Muscle weakness (generalized): Secondary | ICD-10-CM | POA: Diagnosis not present

## 2023-09-10 DIAGNOSIS — M25551 Pain in right hip: Secondary | ICD-10-CM

## 2023-09-10 DIAGNOSIS — R2689 Other abnormalities of gait and mobility: Secondary | ICD-10-CM

## 2023-09-10 DIAGNOSIS — M25541 Pain in joints of right hand: Secondary | ICD-10-CM

## 2023-09-10 LAB — BMP8+EGFR
BUN/Creatinine Ratio: 18 (ref 12–28)
BUN: 18 mg/dL (ref 8–27)
CO2: 24 mmol/L (ref 20–29)
Calcium: 10 mg/dL (ref 8.7–10.3)
Chloride: 103 mmol/L (ref 96–106)
Creatinine, Ser: 1 mg/dL (ref 0.57–1.00)
Glucose: 80 mg/dL (ref 70–99)
Potassium: 4.3 mmol/L (ref 3.5–5.2)
Sodium: 143 mmol/L (ref 134–144)
eGFR: 62 mL/min/{1.73_m2} (ref >59)

## 2023-09-10 NOTE — Progress Notes (Signed)
 Leslie Roberson,   Kidney function improved!

## 2023-09-10 NOTE — Therapy (Signed)
 OUTPATIENT PHYSICAL THERAPY LOWER EXTREMITY TREATMENT  Patient Name: Leslie Roberson MRN: [id number] DOB:November 01, 1955, 68 y.o., female Today's Date: 09/10/2023  END OF SESSION:  PT End of Session - 09/10/23 1318     Visit Number 10    Number of Visits 16    Date for PT Re-Evaluation 10/04/23    Authorization Type [employer]    PT Start Time 1318    PT Stop Time 1358    PT Time Calculation (min) 40 min    Activity Tolerance Patient tolerated treatment well    Behavior During Therapy WFL for tasks assessed/performed          Past Medical History:  Diagnosis Date   Allergy 1992   Anxiety    Arthritis    right little finger; base of thumb left hand; spine (07/07/2017)   Asthma, chronic 11/27/2015   Cataract 2021   Chronic back pain    Coronary artery disease    4/19 PCI/DEx2 to LAD, and PCI/DES x1 to RCA, normal EF   Depression    Diabetes mellitus without complication (HCC) 2020   Diverticulitis of colon 12/21/2015   Colonoscopy every 5 years/digestive health.    GERD (gastroesophageal reflux disease) 11/27/2015   History of blood transfusion    related to spinal fusions   Hyperlipidemia LDL goal <70 11/29/2018   Hypertension    Hypothyroidism    Idiopathic scoliosis 01/22/2016   Osteoporosis 11/27/2015   On prolia .    Pneumonia 2016   Pneumothorax 06/26/2017   Pre-diabetes    Sleep apnea 2020   does not use CPAP   Thyroid  disease    Past Surgical History:  Procedure Laterality Date   ABDOMINAL HYSTERECTOMY     bladder mesh     S/P bladder sling   COLON SURGERY  2007   CORONARY PRESSURE/FFR STUDY N/A 07/07/2017   Procedure: INTRAVASCULAR PRESSURE WIRE/FFR STUDY;  Surgeon: Arleen Lacer, MD;  Location: [facility] INVASIVE CV LAB;  Service: Cardiovascular;  Laterality: N/A;   CORONARY STENT INTERVENTION N/A 07/07/2017   Procedure: CORONARY STENT INTERVENTION;  Surgeon: Arleen Lacer, MD;  Location: [facility] INVASIVE CV LAB;  Service: Cardiovascular;   Laterality: N/A;   ELBOW SURGERY Right    dislocation   HIP ARTHROSCOPY Right 07/30/2023   Procedure: ARTHROSCOPY HIP;  Surgeon: Wilhelmenia Harada, MD;  Location: [facility];  Service: Orthopedics;  Laterality: Right;  RIGHT HIP ARTHROSCOPY WITH PINCER DEBRIDEMENT   INCONTINENCE SURGERY     didn't work   JOINT REPLACEMENT     LEFT HEART CATH AND CORONARY ANGIOGRAPHY N/A 07/07/2017   Procedure: LEFT HEART CATH AND CORONARY ANGIOGRAPHY;  Surgeon: Arleen Lacer, MD;  Location: [facility] INVASIVE CV LAB;  Service: Cardiovascular;  Laterality: N/A;   SMALL INTESTINE SURGERY     SPINAL FUSION  ~ 1970 X 3   below neck - lower back   TONSILLECTOMY     TOTAL HIP ARTHROPLASTY Left    Patient Active Problem List   Diagnosis Date Noted   Right hip impingement syndrome 07/30/2023   Chest congestion 06/03/2023   Toe blister without infection, left, initial encounter 06/02/2023   Nasal congestion 12/31/2022   Fever and chills 12/26/2022   Routine health maintenance 09/08/2022   Need for antibiotic prophylaxis for dental procedure 08/05/2022   Sinobronchitis 07/29/2022   Community acquired pneumonia of left lower lobe of lung 12/11/2021   Closed dislocation of metacarpophalangeal joint of right thumb 11/18/2021   Mass of right  axilla 05/15/2021   Ear fullness, left 04/08/2021   Bilateral hearing loss 04/05/2021   Impacted cerumen, left ear 04/05/2021   Controlled type 2 diabetes mellitus with complication, without long-term current use of insulin (HCC) 11/16/2020   Right hand pain 11/16/2020   Fall 11/16/2020   Cough 05/18/2020   Paroxysmal nocturnal dyspnea 05/14/2020   Orthopnea 05/14/2020   Acute conjunctivitis of both eyes 01/23/2020   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   Ingrown right greater toenail 07/19/2019   Status post hip replacement, left 07/19/2019   Left hip pain 07/19/2019   OSA on CPAP 05/20/2019   Snoring 05/02/2019   Non-restorative sleep  05/02/2019   Migraine without aura and without status migrainosus, not intractable 02/08/2019   OAB (overactive bladder) 02/08/2019   Diabetes mellitus without complication (HCC) 02/07/2019   Hyperlipidemia LDL goal <70 11/29/2018   DJD (degenerative joint disease), lumbosacral 11/29/2018   Pre-operative clearance 11/29/2018   Nasal dryness 02/06/2018   Arthralgia 02/05/2018   Rhinorrhea 01/04/2018   Excessive sweating 09/22/2017   Tongue mass 08/14/2017   Angina, class III (HCC) 07/07/2017   S/P cardiac cath 07/07/2017   CAD S/P percutaneous coronary angioplasty    Pneumothorax 06/25/2017   Chest tightness 05/24/2017   Itchy scalp 05/24/2017   Right leg pain 02/06/2017   Chronic tension-type headache, intractable 02/05/2017   Prediabetes 02/05/2017   Lump in throat 01/27/2017   Dysphagia 01/27/2017   No energy 09/15/2016   Bad taste in mouth 07/26/2016   Paresthesia 04/03/2016   Facet hypertrophy of lumbosacral region 04/03/2016   Chronic back pain 04/03/2016   Right knee pain 02/19/2016   Rosacea 02/04/2016   Vaginal atrophy 01/31/2016   Idiopathic scoliosis 01/22/2016   Hip bursitis 12/25/2015   Biceps tendonitis on right 12/25/2015   Diverticulitis of colon 12/21/2015   GERD (gastroesophageal reflux disease) 11/27/2015   Osteoporosis 11/27/2015   GAD (generalized anxiety disorder) 11/27/2015   MDD (major depressive disorder), recurrent, in full remission (HCC) 11/27/2015   Chronic dryness of both eyes 11/27/2015   Acquired hypothyroidism 11/27/2015   Asthma, chronic 11/27/2015   Insomnia 11/27/2015   Seborrheic keratoses 11/27/2015   CAD in native artery 11/27/2015    PCP: Sandy Crumb, PA REFERRING PROVIDER: Wilhelmenia Harada, MD REFERRING DIAG:  Diagnosis  M25.851 (ICD-10-CM) - Right hip impingement syndrome   THERAPY DIAG:  Muscle weakness (generalized)  Pain in right hip  Pain in joint of right hand  Other abnormalities of gait and  mobility  Rationale for Evaluation and Treatment: Rehabilitation  ONSET DATE: Date of Surgery: 07/30/2023   SUBJECTIVE:  SUBJECTIVE STATEMENT: Patient reports her back and posterior hip are better but continues to have lateral thigh pain that is sharp.  EVAL: The patient has a multi-year h/o R hip pain-- and underwent R hip arthroscopy with pincer debridement and labral repair. She presents s/p surgery with a standard walker (using the one she had at home) so she is carrying the device. She does not have a brace for night (verified with Vance Gell from Dr. Verline Glow office  to ensure this is correct). Patient has pain that is worse when laying down.   UPCOMING PROCEDURE: patient is having botox in her bladder and demonstrates she will need to be able to lift her R leg minimally during procedure.   PERTINENT HISTORY: Diabetes, HTN, hearing loss, CAD, h/o SI surgery in 2023 (pin) PAIN:  Are you having pain? Yes: NPRS scale: 5/10 in R  lateral  hip Pain location: R hip front/back Pain description: soreness, aching Aggravating factors: laying down-- hard to get comfortable Relieving factors: unsure  PRECAUTIONS: Other: hip arthroscopic procedure   *on 5/23, able to progress to post 3 weeks and begin further progression into protocol*  WEIGHT BEARING RESTRICTIONS: Yes WBAT  FALLS:  Has patient fallen in last 6 months? No  LIVING ENVIRONMENT: Lives with: lives with their spouse Lives in: House/apartment Stairs: one step entry- level indoors Has following equipment at home: standard walker-- carries the walker as she moves throughut clinic  PATIENT GOALS: reduce hip pain, return to daily activities  NEXT MD VISIT: 09/11/23  OBJECTIVE:  Note: Objective measures were completed at Evaluation unless otherwise noted. PATIENT SURVEYS:  LEFS 13.8%   COGNITION: Overall cognitive status: Within functional limits for tasks assessed     SENSATION: WFL  DRESSING:   patient had allergy  to waterproof bandage-- she has coban on and therefore is not showering this wee  POSTURE: h/o scoliosis with R rib hump in thoracic spine, C curve concave to the L, and hip asymmetry-- L hip elevated as compared to the R hip   LOWER EXTREMITY ROM: *when patient moves into supine, she goes into end range ER and has >60 deg bilat. PT educated her that we want to prop with a pillow at night to prevent excessive hip ERr Passive ROM Right eval Left eval Right 09/07/23  Hip flexion 90  105 AROM  Hip extension 0    Hip abduction 30    Hip adduction     Hip internal rotation   20 deg PROM  Hip external rotation *excessive Goes into 60+ deg in supine-- PT educated to limit to 20 degrees using pillow prop     Knee flexion     Knee extension     Ankle dorsiflexion     Ankle plantarflexion     Ankle inversion     Ankle eversion      (Blank rows = not tested)  LOWER EXTREMITY MMT: Deferred due to post surgical status MMT Right eval Left eval  Hip flexion    Hip extension    Hip abduction    Hip adduction    Hip internal rotation    Hip external rotation    Knee flexion    Knee extension    Ankle dorsiflexion    Ankle plantarflexion    Ankle inversion    Ankle eversion     (Blank rows = not tested)  FUNCTIONAL TESTS:  Sit<>stand independently to device without UE support  GAIT: Distance walked: 50 ft Assistive device utilized: standard walker Level of assistance: Modified independence Comments: 2.11 ft/sec *PT switched patient out to RW so she is not carrying the standard walker. She is WBAT   [facility] Adult PT Treatment:                                                DATE: 09/10/2023 Therapeutic Exercise: AAROM hip flexion with strap  Prone: Knee flexion/extension AROM Hip ER/IR AAROM S/L clamshells 2x10 Neuromuscular re-ed: Bent knee fall out x15 (bil) --> tactile cue to improve proprioceptive awareness Bridges + ball b/w knees  Staggered stance bridge  x10 Quadruped: Rocking Posterior pelvic tilts Prone hip extension 2x10 --> 2 pillows under hips + folded towel under Rt hip  Modified 1/2 kneel: 1#MB diagonal  chops (bil) 1#paloff press  Rt SLB x30  Manual Therapy: IASTM Rt glutes    [facility] Adult PT Treatment:                                                DATE: 09/07/23 Therapeutic Exercise: Sidelying Hip abduction x 10 reps x 2 sets Supine PROM hip IR/ER Quadriped  Anterior/posterior rocking Gets to 115 degrees of hip flexion with posterior rocking Standing Step ups lateral x 10 reps R side Anterior x 10 reps with fatigue lateral hip R side Reciprocal up/up, down/down x 10 reps Press down into L sided pole (for L trunk elongation) while pulling down with yellow band with R UE for core engagement Press down into L sided pole (for L trunk elongation) while rolling a ball with L LE  Prone (modified with 2 pillows under stomach) Knee flexion x 10 reps x 2 sets PROM hip IR/ER within tolerance (can get to 30 degrees IR in prone) Hip initiation of extension with toe down doing quad sets  x 10 reps x 2 sets Manual Therapy: STM R glut med while in prone Neuromuscular re-ed: Single leg standing R and L sides, decreasing reliance on Ues Marching with alternating holds for single leg support Therapeutic Activity: Marching with one UE support R and L Stepping into shorts-- had patient work on bending down with theraband to initiate dressing activities as this is a challenging task at home Standing in stride position reaching with contralateral UE overhead Gait: X 200 feet x 2 reps with cues for equal stride   [facility] Adult PT Treatment:                                                DATE: 09/03/2023 Therapeutic Exercise: Prone (with 2 pillows under hips): Knee flexion R x 10 Gentle PROM hip IR/ER within protocol allowance HS curls + YTB (light) 2x10 Hip extension (neutral with pillow placement) 2x10 Lateral hip self-massage with  4 ball Neuromuscular re-ed: Standing hip abd --> focus on parallel LE alignment Rt SLB + HHAx1 Weight shifting in standing performing rolling a ball up/down wall (Rt), YTB pull up (Lt) and working on trunk elongation Standing abdominal bracing + press down on orange PB --> standing with back against wall Modified 1/2 kneeling (opp shin supported on low table) + alt diagonal reaches with 1#MB --> 0# ball Therapeutic Activity: Side stepping Diagonal step together, reciprocal stepping Step up/down reciprocal stepping on 4 step + bilateral railing   PATIENT EDUCATION:  Education details: Updated HEP  Person educated: Patient Education method: Explanation, Demonstration, and Handouts Education comprehension: verbalized understanding, returned demonstration, and needs further education  HOME EXERCISE PROGRAM: Access Code: [email or web address] URL: [email or web address] Date: 08/26/2023 Prepared by: Sims Duck  Program Notes Avoid sitting for more than 20-30 minutes at a time.Since you are not comfortable on your stomach, lay flat with legs out (prop a folded pillow beside the R foot to prevent it turning out) for 15 minutes, 4 times/day to ensure the hip does not get tight.Do not bend past 90 degrees and AVOID straight leg raise.All exercises should be pain free.  Exercises - Heel Raises with Counter Support  - 2 x daily - 7  x weekly - 1 sets - 10 reps - Standing Alternating Knee Flexion  - 2 x daily - 7 x weekly - 1 sets - 10 reps - Modified Cat Cow on Counter  - 1 x daily - 7 x weekly - 3 sets - 10 reps - Standing Glute Med Mobilization with Small Ball on Wall  - 2 x daily - 5 x weekly - 1 sets - 10 reps - Standing Knee Flexion AROM with Chair Support  - 1 x daily - 7 x weekly - 3 sets - 10 reps - Supine Bridge  - 1 x daily - 7 x weekly - 3 sets - 10 reps  ASSESSMENT: CLINICAL IMPRESSION:  Exercises continued per protocol, modifying kneeling exercises due to low tolerance  with weight bearing knee. Propping up right hip improved postural alignment and alleviated muscular strain during prone hip extension. Increased lateral thigh discomfort during single leg balance.   EVAL: Patient is a 68 y.o. female who was seen today for physical therapy evaluation and treatment S/P R hip arthroscopic surgery. She presents with impairments in hip ROM, muscle strength, functional mobility, gait speed, and balance. She also has h/o scoliosis which leads to asymmetry. Patient presents today with excessive ROM into ER and PT reviewed limitations of ROM at this time. Patient will benefit from PT services to improve strength, progress ambulation, and return to ADLs and IADLs.  OBJECTIVE IMPAIRMENTS: Abnormal gait, decreased activity tolerance, decreased balance, decreased endurance, decreased ROM, decreased strength, hypomobility, increased fascial restrictions, impaired flexibility, postural dysfunction, and pain.   GOALS: Goals reviewed with patient? Yes  SHORT TERM GOALS: Target date: 09/04/23    Patient will be independent and compliant with initial HEP.    Baseline: issued at eval  Goal status: MET   2.  Patient will demonstrate step through gait pattern without AD.  Baseline: step to with crutches Goal status: MET on 08/21/23   3.  Patient will demonstrate at least 90 degrees of active hip flexion ROM to improve ability to complete squatting activity.  Baseline: see PROM above 09/03/23: 90 degrees AROM Goal status: MET   4.  Patient will be able to ascend/descend stairs with reciprocal pattern. Baseline: step to with crutches  Goal status: MET   LONG TERM GOALS: Target date: 10/04/23     Patient will score >/= 40% on the LEFS (MCID is 9) to signify clinically meaningful improvement in functional abilities.    Baseline: 13.8%  09/03/23: 26/80 = 35.0% Goal status: IN PROGRESS   2.  Patient will demonstrate at least 4/5 R hip strength to improve stability with  walking/standing activity.  Baseline: deferred  Goal status: INITIAL   3.  Patient will demonstrate functional and pain free R hip ROM.  Baseline: see PROM above  Goal status: INITIAL   4.  Patient will be able to ambulate community distances without AD without hip pain.  Baseline: see gait above  Goal status: INITIAL   5.  Patient will be independent with advanced home program to progress/maintain current level of function.  Baseline: initial HEP issued  Goal status: INITIAL   6.  Patient will maintain SLS on the RLE for at least 10 seconds to improve gait stability.  Baseline: unable  Goal status: INITIAL  PLAN:  PT FREQUENCY: 2x/week  PT DURATION: 8 weeks  PLANNED INTERVENTIONS: 97164- PT Re-evaluation, 97110-Therapeutic exercises, 97530- Therapeutic activity, [id number]- Neuromuscular re-education, 97535- Self Care, 27253- Manual therapy, [phone]- Gait training, Patient/Family education, Balance training,  Taping, Dry Needling, Cryotherapy, and Moist heat  PLAN FOR NEXT SESSION: Progress via protocol.    Flint Hummer, PTA 09/10/2023, 2:01 PM

## 2023-09-10 NOTE — Telephone Encounter (Signed)
 09/10/2023-Left message on patients voicemail to contact office to schedule next prolia  injection. Patient notified that OOP cost is $250 through pharmacy benefits.

## 2023-09-11 ENCOUNTER — Ambulatory Visit (INDEPENDENT_AMBULATORY_CARE_PROVIDER_SITE_OTHER): Admitting: Orthopaedic Surgery

## 2023-09-11 ENCOUNTER — Encounter (HOSPITAL_BASED_OUTPATIENT_CLINIC_OR_DEPARTMENT_OTHER): Payer: Self-pay | Admitting: Orthopaedic Surgery

## 2023-09-11 DIAGNOSIS — M25851 Other specified joint disorders, right hip: Secondary | ICD-10-CM

## 2023-09-11 NOTE — Progress Notes (Signed)
 Post Operative Evaluation    Procedure/Date of Surgery: Right hip pincer debridement 5/1  Interval History:    Presents 6 weeks status post the above procedure.  Today she is experiencing more lateral based hip pain around the glutes.  Overall she did not get significant relief from her injection.  She is still dealing with somewhat of a limp in the setting of known scoliosis   PMH/PSH/Family History/Social History/Meds/Allergies:    Past Medical History:  Diagnosis Date   Allergy 1992   Anxiety    Arthritis    right little finger; base of thumb left hand; spine (07/07/2017)   Asthma, chronic 11/27/2015   Cataract 2021   Chronic back pain    Coronary artery disease    4/19 PCI/DEx2 to LAD, and PCI/DES x1 to RCA, normal EF   Depression    Diabetes mellitus without complication (HCC) 2020   Diverticulitis of colon 12/21/2015   Colonoscopy every 5 years/digestive health.    GERD (gastroesophageal reflux disease) 11/27/2015   History of blood transfusion    related to spinal fusions   Hyperlipidemia LDL goal <70 11/29/2018   Hypertension    Hypothyroidism    Idiopathic scoliosis 01/22/2016   Osteoporosis 11/27/2015   On prolia .    Pneumonia 2016   Pneumothorax 06/26/2017   Pre-diabetes    Sleep apnea 2020   does not use CPAP   Thyroid  disease    Past Surgical History:  Procedure Laterality Date   ABDOMINAL HYSTERECTOMY     bladder mesh     S/P bladder sling   COLON SURGERY  2007   CORONARY PRESSURE/FFR STUDY N/A 07/07/2017   Procedure: INTRAVASCULAR PRESSURE WIRE/FFR STUDY;  Surgeon: Arleen Lacer, MD;  Location: [facility] INVASIVE CV LAB;  Service: Cardiovascular;  Laterality: N/A;   CORONARY STENT INTERVENTION N/A 07/07/2017   Procedure: CORONARY STENT INTERVENTION;  Surgeon: Arleen Lacer, MD;  Location: [facility] INVASIVE CV LAB;  Service: Cardiovascular;  Laterality: N/A;   ELBOW SURGERY Right    dislocation   HIP ARTHROSCOPY  Right 07/30/2023   Procedure: ARTHROSCOPY HIP;  Surgeon: Wilhelmenia Harada, MD;  Location: [facility];  Service: Orthopedics;  Laterality: Right;  RIGHT HIP ARTHROSCOPY WITH PINCER DEBRIDEMENT   INCONTINENCE SURGERY     didn't work   JOINT REPLACEMENT     LEFT HEART CATH AND CORONARY ANGIOGRAPHY N/A 07/07/2017   Procedure: LEFT HEART CATH AND CORONARY ANGIOGRAPHY;  Surgeon: Arleen Lacer, MD;  Location: [facility] INVASIVE CV LAB;  Service: Cardiovascular;  Laterality: N/A;   SMALL INTESTINE SURGERY     SPINAL FUSION  ~ 1970 X 3   below neck - lower back   TONSILLECTOMY     TOTAL HIP ARTHROPLASTY Left    Social History   Socioeconomic History   Marital status: Married    Spouse name: Marylou Sobers   Number of children: 1   Years of education: 16   Highest education level: Bachelor's degree (e.g., BA, AB, BS)  Occupational History   Occupation: Retired  Tobacco Use   Smoking status: Never   Smokeless tobacco: Never  Vaping Use   Vaping status: Never Used  Substance and Sexual Activity   Alcohol use: Yes    Comment: Maybe 3 drinks/year   Drug use: No   Sexual activity: Not  Currently    Birth control/protection: None, Surgical    Comment: hyst  Other Topics Concern   Not on file  Social History Narrative   Lives with her husband. She has one child. She enjoys playing games on the computer.   Social Drivers of Corporate investment banker Strain: Low Risk  (07/04/2023)   Received from [facility]   Overall Financial Resource Strain (CARDIA)    Difficulty of Paying Living Expenses: Not very hard  Food Insecurity: No Food Insecurity (07/04/2023)   Received from [facility]   Hunger Vital Sign    Within the past 12 months, you worried that your food would run out before you got the money to buy more.: Never true    Within the past 12 months, the food you bought just didn't last and you didn't have money to get more.: Never true  Transportation Needs: No Transportation  Needs (07/04/2023)   Received from [facility] - Transportation    Lack of Transportation (Medical): No    Lack of Transportation (Non-Medical): No  Physical Activity: Unknown (07/04/2023)   Received from [facility]   Exercise Vital Sign    On average, how many days per week do you engage in moderate to strenuous exercise (like a brisk walk)?: 0 days    Minutes of Exercise per Session: Not on file  Recent Concern: Physical Activity - Inactive (05/23/2023)   Exercise Vital Sign    Days of Exercise per Week: 0 days    Minutes of Exercise per Session: 0 min  Stress: No Stress Concern Present (07/04/2023)   Received from [facility] - Occupational Stress Questionnaire    Feeling of Stress : Only a little  Social Connections: Somewhat Isolated (07/04/2023)   Received from [facility]   Social Network    How would you rate your social network (family, work, friends)?: Restricted participation with some degree of social isolation   Family History  Problem Relation Age of Onset   Cancer Mother        lung   Depression Mother    Early death Mother    Heart attack Father    Hyperlipidemia Father    Hypertension Father    Arthritis Father    Heart disease Father    Diabetes Maternal Aunt    Hyperlipidemia Paternal Aunt    COPD Paternal Aunt    Hypertension Paternal Aunt    Stroke Paternal Aunt    Allergies  Allergen Reactions   Hydromorphone Rash   Levofloxacin Other (See Comments)    Hallucinations   Meperidine Nausea And Vomiting and Other (See Comments)    Also doesn't work well for my pain   Broccoli [Brassica Oleracea] Nausea Only and Other (See Comments)    Causes stomach pain also   Zaleplon     Trouble breathing   Adhesive [Tape] Rash   Current Outpatient Medications  Medication Sig Dispense Refill   albuterol  (VENTOLIN  HFA) 108 (90 Base) MCG/ACT inhaler INHALE 1-2 PUFFS INTO THE LUNGS EVERY 4 HOURS AS NEEDED  FOR WHEEZING OR SHORTNESS OF BREATH. 18 g 2   aspirin  EC 325 MG tablet Take 1 tablet (325 mg total) by mouth daily. 14 tablet 0   atorvastatin  (LIPITOR) 20 MG tablet Take 1 tablet (20 mg total) by mouth daily. 90 tablet 2   brexpiprazole (REXULTI) 1 MG TABS tablet Take 1 mg by mouth daily.  busPIRone  (BUSPAR ) 15 MG tablet Take 60 mg by mouth daily.     calcium -vitamin D  (OSCAL WITH D) 500-200 MG-UNIT tablet Take 1 tablet by mouth daily.     denosumab  (PROLIA ) 60 MG/ML SOLN injection Inject 60 mg into the skin every 6 (six) months. Administer in upper arm, thigh, or abdomen     desvenlafaxine  (PRISTIQ ) 100 MG 24 hr tablet Take 100 mg by mouth in the morning.     Empagliflozin-metFORMIN  HCl ER (SYNJARDY  XR) 25-1000 MG TB24 Take 1 tablet by mouth daily. 90 tablet 1   fluticasone  (FLONASE ) 50 MCG/ACT nasal spray SPRAY 2 SPRAYS INTO EACH NOSTRIL EVERY DAY 48 mL 1   gabapentin  (NEURONTIN ) 300 MG capsule TAKE 1 CAPSULE IN THE MORNING AND 2 CAPSULES AT BEDTIME (Patient taking differently: TAKE  2 CAPSULES AT BEDTIME) 270 capsule 3   ipratropium-albuterol  (DUONEB) 0.5-2.5 (3) MG/3ML SOLN Take 3 mLs by nebulization every 2 (two) hours as needed (wheeze, SOB). 60 mL 0   ketoconazole (NIZORAL) 2 % shampoo Apply topically.     levothyroxine  (SYNTHROID ) 137 MCG tablet TAKE 1 TABLET ONCE A WEEK THEN 1/2 TABLET ALL OTHER DAYS AS DIRECTED 52 tablet 2   Menthol, Topical Analgesic, (MINERAL ICE EX) Apply 1 application to affected sites one to two times a day as needed (for pain)     metoprolol  succinate (TOPROL -XL) 25 MG 24 hr tablet TAKE 1 TABLET (25 MG TOTAL) BY MOUTH DAILY. 90 tablet 2   nitroGLYCERIN  (NITROSTAT ) 0.4 MG SL tablet DISSOLVE 1 TAB UNDER THE TONGUE FOR CHEST PAIN EVERY 5 MINUTES UP TO 3 DOSES IF PAIN PERSIST CALL 911/SEEK MEDICAL HELP 75 tablet 2   Omega-3 Fatty Acids (FISH OIL PO) Take 1 capsule by mouth daily.      pantoprazole  (PROTONIX ) 40 MG tablet TAKE 1 TABLET EVERY DAY 90 tablet 3    solifenacin  (VESICARE ) 10 MG tablet Take 1 tablet (10 mg total) by mouth daily. 30 tablet 1   trazodone  (DESYREL ) 300 MG tablet Take 300 mg by mouth at bedtime.     Current Facility-Administered Medications  Medication Dose Route Frequency Provider Last Rate Last Admin   [START ON 03/06/2024] denosumab  (PROLIA ) injection 60 mg  60 mg Subcutaneous Q6 months Breeback, Jade L, PA-C       No results found.  Review of Systems:   A ROS was performed including pertinent positives and negatives as documented in the HPI.   Musculoskeletal Exam:    There were no vitals taken for this visit.  Right hip incisions are well-appearing without erythema or drainage.  Some tenderness about the greater trochanter which radiates to the posterior buttocks without any groin pain.  30 degrees internal/external rotation of the right hip without pain  Imaging:      I personally reviewed and interpreted the radiographs.   Assessment:   6-week status post right hip pincer debridement doing well from a femoral acetabular standpoint.  She does have quite significant gluteal symptoms at today's visit.  I do believe that much of this is overcompensation as result of her leftward leaning gait in the setting of known scoliosis.  I will plan to see her back in 2 months to check her progress and I would like her to continue to work on gluteal strengthening although I would also like to plan to refer her to [facility] for discussion of complex scoliosis deformity correction given the fact that she is still young and still highly active Plan :    -  Plan to referral to [facility] for discussion of scoliosis corrective surgery        I personally saw and evaluated the patient, and participated in the management and treatment plan.  Wilhelmenia Harada, MD Attending Physician, Orthopedic Surgery  This document was dictated using Dragon voice recognition software. A reasonable attempt at proof reading has been made to minimize  errors.

## 2023-09-14 ENCOUNTER — Ambulatory Visit: Admitting: Rehabilitative and Restorative Service Providers"

## 2023-09-14 ENCOUNTER — Other Ambulatory Visit: Payer: Self-pay

## 2023-09-14 ENCOUNTER — Ambulatory Visit
Admission: EM | Admit: 2023-09-14 | Discharge: 2023-09-14 | Disposition: A | Discharge status: Home / Self Care | Attending: Family Medicine | Admitting: Family Medicine

## 2023-09-14 ENCOUNTER — Ambulatory Visit (INDEPENDENT_AMBULATORY_CARE_PROVIDER_SITE_OTHER)

## 2023-09-14 DIAGNOSIS — R1032 Left lower quadrant pain: Secondary | ICD-10-CM | POA: Diagnosis not present

## 2023-09-14 DIAGNOSIS — Z96642 Presence of left artificial hip joint: Secondary | ICD-10-CM | POA: Diagnosis not present

## 2023-09-14 DIAGNOSIS — K5790 Diverticulosis of intestine, part unspecified, without perforation or abscess without bleeding: Secondary | ICD-10-CM | POA: Diagnosis not present

## 2023-09-14 DIAGNOSIS — K5730 Diverticulosis of large intestine without perforation or abscess without bleeding: Secondary | ICD-10-CM

## 2023-09-14 DIAGNOSIS — I2510 Atherosclerotic heart disease of native coronary artery without angina pectoris: Secondary | ICD-10-CM | POA: Diagnosis not present

## 2023-09-14 DIAGNOSIS — K5903 Drug induced constipation: Secondary | ICD-10-CM | POA: Diagnosis not present

## 2023-09-14 LAB — POCT URINALYSIS DIP (MANUAL ENTRY)
Bilirubin, UA: NEGATIVE
Blood, UA: NEGATIVE
Glucose, UA: >=1000 mg/dL — AB
Leukocytes, UA: NEGATIVE
Nitrite, UA: NEGATIVE
Protein Ur, POC: NEGATIVE mg/dL
Spec Grav, UA: 1.02 (ref 1.010–1.025)
Urobilinogen, UA: 0.2 U/dL
pH, UA: 6 (ref 5.0–8.0)

## 2023-09-14 LAB — POCT FASTING CBG KUC MANUAL ENTRY: POCT Glucose (KUC): 103 mg/dL — AB (ref 70–99)

## 2023-09-14 MED ORDER — AMOXICILLIN-POT CLAVULANATE 875-125 MG PO TABS: 1.0000 | ORAL_TABLET | Freq: Two times a day (BID) | ORAL | 0 refills | Status: DC

## 2023-09-14 NOTE — Therapy (Deleted)
 OUTPATIENT PHYSICAL THERAPY LOWER EXTREMITY TREATMENT  Patient Name: Leslie Roberson MRN: [id number] DOB:03/01/1956, 68 y.o., female Today's Date: 09/14/2023  END OF SESSION:    Past Medical History:  Diagnosis Date   Allergy 1992   Anxiety    Arthritis    right little finger; base of thumb left hand; spine (07/07/2017)   Asthma, chronic 11/27/2015   Cataract 2021   Chronic back pain    Coronary artery disease    4/19 PCI/DEx2 to LAD, and PCI/DES x1 to RCA, normal EF   Depression    Diabetes mellitus without complication (HCC) 2020   Diverticulitis of colon 12/21/2015   Colonoscopy every 5 years/digestive health.    GERD (gastroesophageal reflux disease) 11/27/2015   History of blood transfusion    related to spinal fusions   Hyperlipidemia LDL goal <70 11/29/2018   Hypertension    Hypothyroidism    Idiopathic scoliosis 01/22/2016   Osteoporosis 11/27/2015   On prolia .    Pneumonia 2016   Pneumothorax 06/26/2017   Pre-diabetes    Sleep apnea 2020   does not use CPAP   Thyroid  disease    Past Surgical History:  Procedure Laterality Date   ABDOMINAL HYSTERECTOMY     bladder mesh     S/P bladder sling   COLON SURGERY  2007   CORONARY PRESSURE/FFR STUDY N/A 07/07/2017   Procedure: INTRAVASCULAR PRESSURE WIRE/FFR STUDY;  Surgeon: Arleen Lacer, MD;  Location: [facility] INVASIVE CV LAB;  Service: Cardiovascular;  Laterality: N/A;   CORONARY STENT INTERVENTION N/A 07/07/2017   Procedure: CORONARY STENT INTERVENTION;  Surgeon: Arleen Lacer, MD;  Location: [facility] INVASIVE CV LAB;  Service: Cardiovascular;  Laterality: N/A;   ELBOW SURGERY Right    dislocation   HIP ARTHROSCOPY Right 07/30/2023   Procedure: ARTHROSCOPY HIP;  Surgeon: Wilhelmenia Harada, MD;  Location: [facility];  Service: Orthopedics;  Laterality: Right;  RIGHT HIP ARTHROSCOPY WITH PINCER DEBRIDEMENT   INCONTINENCE SURGERY     didn't work   JOINT REPLACEMENT     LEFT HEART CATH AND  CORONARY ANGIOGRAPHY N/A 07/07/2017   Procedure: LEFT HEART CATH AND CORONARY ANGIOGRAPHY;  Surgeon: Arleen Lacer, MD;  Location: [facility] INVASIVE CV LAB;  Service: Cardiovascular;  Laterality: N/A;   SMALL INTESTINE SURGERY     SPINAL FUSION  ~ 1970 X 3   below neck - lower back   TONSILLECTOMY     TOTAL HIP ARTHROPLASTY Left    Patient Active Problem List   Diagnosis Date Noted   Right hip impingement syndrome 07/30/2023   Chest congestion 06/03/2023   Toe blister without infection, left, initial encounter 06/02/2023   Nasal congestion 12/31/2022   Fever and chills 12/26/2022   Routine health maintenance 09/08/2022   Need for antibiotic prophylaxis for dental procedure 08/05/2022   Sinobronchitis 07/29/2022   Community acquired pneumonia of left lower lobe of lung 12/11/2021   Closed dislocation of metacarpophalangeal joint of right thumb 11/18/2021   Mass of right axilla 05/15/2021   Ear fullness, left 04/08/2021   Bilateral hearing loss 04/05/2021   Impacted cerumen, left ear 04/05/2021   Controlled type 2 diabetes mellitus with complication, without long-term current use of insulin (HCC) 11/16/2020   Right hand pain 11/16/2020   Fall 11/16/2020   Cough 05/18/2020   Paroxysmal nocturnal dyspnea 05/14/2020   Orthopnea 05/14/2020   Acute conjunctivitis of both eyes 01/23/2020   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   Ingrown right greater toenail  07/19/2019   Status post hip replacement, left 07/19/2019   Left hip pain 07/19/2019   OSA on CPAP 05/20/2019   Snoring 05/02/2019   Non-restorative sleep 05/02/2019   Migraine without aura and without status migrainosus, not intractable 02/08/2019   OAB (overactive bladder) 02/08/2019   Diabetes mellitus without complication (HCC) 02/07/2019   Hyperlipidemia LDL goal <70 11/29/2018   DJD (degenerative joint disease), lumbosacral 11/29/2018   Pre-operative clearance 11/29/2018   Nasal dryness 02/06/2018   Arthralgia  02/05/2018   Rhinorrhea 01/04/2018   Excessive sweating 09/22/2017   Tongue mass 08/14/2017   Angina, class III (HCC) 07/07/2017   S/P cardiac cath 07/07/2017   CAD S/P percutaneous coronary angioplasty    Pneumothorax 06/25/2017   Chest tightness 05/24/2017   Itchy scalp 05/24/2017   Right leg pain 02/06/2017   Chronic tension-type headache, intractable 02/05/2017   Prediabetes 02/05/2017   Lump in throat 01/27/2017   Dysphagia 01/27/2017   No energy 09/15/2016   Bad taste in mouth 07/26/2016   Paresthesia 04/03/2016   Facet hypertrophy of lumbosacral region 04/03/2016   Chronic back pain 04/03/2016   Right knee pain 02/19/2016   Rosacea 02/04/2016   Vaginal atrophy 01/31/2016   Idiopathic scoliosis 01/22/2016   Hip bursitis 12/25/2015   Biceps tendonitis on right 12/25/2015   Diverticulitis of colon 12/21/2015   GERD (gastroesophageal reflux disease) 11/27/2015   Osteoporosis 11/27/2015   GAD (generalized anxiety disorder) 11/27/2015   MDD (major depressive disorder), recurrent, in full remission (HCC) 11/27/2015   Chronic dryness of both eyes 11/27/2015   Acquired hypothyroidism 11/27/2015   Asthma, chronic 11/27/2015   Insomnia 11/27/2015   Seborrheic keratoses 11/27/2015   CAD in native artery 11/27/2015    PCP: Sandy Crumb, PA REFERRING PROVIDER: Wilhelmenia Harada, MD REFERRING DIAG:  Diagnosis  M25.851 (ICD-10-CM) - Right hip impingement syndrome   THERAPY DIAG:  No diagnosis found.  Rationale for Evaluation and Treatment: Rehabilitation  ONSET DATE: Date of Surgery: 07/30/2023   SUBJECTIVE:  SUBJECTIVE STATEMENT: Patient reports her back and posterior hip are better but continues to have lateral thigh pain that is sharp.  EVAL: The patient has a multi-year h/o R hip pain-- and underwent R hip arthroscopy with pincer debridement and labral repair. She presents s/p surgery with a standard walker (using the one she had at home) so she is carrying the  device. She does not have a brace for night (verified with Vance Gell from Dr. Verline Glow office  to ensure this is correct). Patient has pain that is worse when laying down.   UPCOMING PROCEDURE: patient is having botox in her bladder and demonstrates she will need to be able to lift her R leg minimally during procedure.   PERTINENT HISTORY: Diabetes, HTN, hearing loss, CAD, h/o SI surgery in 2023 (pin) PAIN:  Are you having pain? Yes: NPRS scale: 5/10 in R  lateral hip Pain location: R hip front/back Pain description: soreness, aching Aggravating factors: laying down-- hard to get comfortable Relieving factors: unsure  PRECAUTIONS: Other: hip arthroscopic procedure   *on 5/23, able to progress to post 3 weeks and begin further progression into protocol*  WEIGHT BEARING RESTRICTIONS: Yes WBAT  FALLS:  Has patient fallen in last 6 months? No  LIVING ENVIRONMENT: Lives with: lives with their spouse Lives in: House/apartment Stairs: one step entry- level indoors Has following equipment at home: standard walker-- carries the walker as she moves throughut clinic  PATIENT GOALS: reduce hip pain, return to daily activities  NEXT MD VISIT: 09/11/23  OBJECTIVE:  Note: Objective measures were completed at Evaluation unless otherwise noted. PATIENT SURVEYS:  LEFS 13.8%   COGNITION: Overall cognitive status: Within functional limits for tasks assessed     SENSATION: WFL  DRESSING:   patient had allergy to waterproof bandage-- she has coban on and therefore is not showering this wee  POSTURE: h/o scoliosis with R rib hump in thoracic spine, C curve concave to the L, and hip asymmetry-- L hip elevated as compared to the R hip   LOWER EXTREMITY ROM: *when patient moves into supine, she goes into end range ER and has >60 deg bilat. PT educated her that we want to prop with a pillow at night to prevent excessive hip ERr Passive ROM Right eval Left eval Right 09/07/23  Hip flexion 90   105 AROM  Hip extension 0    Hip abduction 30    Hip adduction     Hip internal rotation   20 deg PROM  Hip external rotation *excessive Goes into 60+ deg in supine-- PT educated to limit to 20 degrees using pillow prop     Knee flexion     Knee extension     Ankle dorsiflexion     Ankle plantarflexion     Ankle inversion     Ankle eversion      (Blank rows = not tested)  LOWER EXTREMITY MMT: Deferred due to post surgical status MMT Right eval Left eval  Hip flexion    Hip extension    Hip abduction    Hip adduction    Hip internal rotation    Hip external rotation    Knee flexion    Knee extension    Ankle dorsiflexion    Ankle plantarflexion    Ankle inversion    Ankle eversion     (Blank rows = not tested)  FUNCTIONAL TESTS:  Sit<>stand independently to device without UE support  GAIT: Distance walked: 50 ft Assistive device utilized: standard walker Level of assistance: Modified independence Comments: 2.11 ft/sec *PT switched patient out to RW so she is not carrying the standard walker. She is WBAT  [facility] Adult PT Treatment:                                                DATE: 09/14/23 Therapeutic Exercise: *** Manual Therapy: *** Neuromuscular re-ed: *** Therapeutic Activity: *** Gait: *** Modalities: *** Self Care: ***  [facility] Adult PT Treatment:                                                DATE: 09/10/2023 Therapeutic Exercise: AAROM hip flexion with strap  Prone: Knee flexion/extension AROM Hip ER/IR AAROM S/L clamshells 2x10 Neuromuscular re-ed: Bent knee fall out x15 (bil) --> tactile cue to improve proprioceptive awareness Bridges + ball b/w knees  Staggered stance bridge x10 Quadruped: Rocking Posterior pelvic tilts Prone hip extension 2x10 --> 2 pillows under hips + folded towel under Rt hip  Modified 1/2 kneel: 1#MB diagonal chops (bil) 1#paloff press  Rt SLB x30  Manual Therapy: IASTM Rt glutes   [facility] Adult PT Treatment:  DATE: 09/07/23 Therapeutic Exercise: Sidelying Hip abduction x 10 reps x 2 sets Supine PROM hip IR/ER Quadriped  Anterior/posterior rocking Gets to 115 degrees of hip flexion with posterior rocking Standing Step ups lateral x 10 reps R side Anterior x 10 reps with fatigue lateral hip R side Reciprocal up/up, down/down x 10 reps Press down into L sided pole (for L trunk elongation) while pulling down with yellow band with R UE for core engagement Press down into L sided pole (for L trunk elongation) while rolling a ball with L LE  Prone (modified with 2 pillows under stomach) Knee flexion x 10 reps x 2 sets PROM hip IR/ER within tolerance (can get to 30 degrees IR in prone) Hip initiation of extension with toe down doing quad sets  x 10 reps x 2 sets Manual Therapy: STM R glut med while in prone Neuromuscular re-ed: Single leg standing R and L sides, decreasing reliance on Ues Marching with alternating holds for single leg support Therapeutic Activity: Marching with one UE support R and L Stepping into shorts-- had patient work on bending down with theraband to initiate dressing activities as this is a challenging task at home Standing in stride position reaching with contralateral UE overhead Gait: X 200 feet x 2 reps with cues for equal stride   [facility] Adult PT Treatment:                                                DATE: 09/03/2023 Therapeutic Exercise: Prone (with 2 pillows under hips): Knee flexion R x 10 Gentle PROM hip IR/ER within protocol allowance HS curls + YTB (light) 2x10 Hip extension (neutral with pillow placement) 2x10 Lateral hip self-massage with 4 ball Neuromuscular re-ed: Standing hip abd --> focus on parallel LE alignment Rt SLB + HHAx1 Weight shifting in standing performing rolling a ball up/down wall (Rt), YTB pull up (Lt) and working on trunk elongation Standing abdominal bracing + press down on orange PB  --> standing with back against wall Modified 1/2 kneeling (opp shin supported on low table) + alt diagonal reaches with 1#MB --> 0# ball Therapeutic Activity: Side stepping Diagonal step together, reciprocal stepping Step up/down reciprocal stepping on 4 step + bilateral railing   PATIENT EDUCATION:  Education details: Updated HEP  Person educated: Patient Education method: Explanation, Demonstration, and Handouts Education comprehension: verbalized understanding, returned demonstration, and needs further education  HOME EXERCISE PROGRAM: Access Code: [email or web address] URL: [email or web address] Date: 08/26/2023 Prepared by: Sims Duck  Program Notes Avoid sitting for more than 20-30 minutes at a time.Since you are not comfortable on your stomach, lay flat with legs out (prop a folded pillow beside the R foot to prevent it turning out) for 15 minutes, 4 times/day to ensure the hip does not get tight.Do not bend past 90 degrees and AVOID straight leg raise.All exercises should be pain free.  Exercises - Heel Raises with Counter Support  - 2 x daily - 7 x weekly - 1 sets - 10 reps - Standing Alternating Knee Flexion  - 2 x daily - 7 x weekly - 1 sets - 10 reps - Modified Cat Cow on Counter  - 1 x daily - 7 x weekly - 3 sets - 10 reps - Standing Glute Med Mobilization with Small Ball on Wall  - 2 x daily - 5 x weekly -  1 sets - 10 reps - Standing Knee Flexion AROM with Chair Support  - 1 x daily - 7 x weekly - 3 sets - 10 reps - Supine Bridge  - 1 x daily - 7 x weekly - 3 sets - 10 reps  ASSESSMENT: CLINICAL IMPRESSION:  ***Exercises continued per protocol, modifying kneeling exercises due to low tolerance with weight bearing knee. Propping up right hip improved postural alignment and alleviated muscular strain during prone hip extension. Increased lateral thigh discomfort during single leg balance.   EVAL: Patient is a 68 y.o. female who was seen today for physical therapy  evaluation and treatment S/P R hip arthroscopic surgery. She presents with impairments in hip ROM, muscle strength, functional mobility, gait speed, and balance. She also has h/o scoliosis which leads to asymmetry. Patient presents today with excessive ROM into ER and PT reviewed limitations of ROM at this time. Patient will benefit from PT services to improve strength, progress ambulation, and return to ADLs and IADLs.  OBJECTIVE IMPAIRMENTS: Abnormal gait, decreased activity tolerance, decreased balance, decreased endurance, decreased ROM, decreased strength, hypomobility, increased fascial restrictions, impaired flexibility, postural dysfunction, and pain.   GOALS: Goals reviewed with patient? Yes  SHORT TERM GOALS: Target date: 09/04/23    Patient will be independent and compliant with initial HEP.    Baseline: issued at eval  Goal status: MET   2.  Patient will demonstrate step through gait pattern without AD.  Baseline: step to with crutches Goal status: MET on 08/21/23   3.  Patient will demonstrate at least 90 degrees of active hip flexion ROM to improve ability to complete squatting activity.  Baseline: see PROM above 09/03/23: 90 degrees AROM Goal status: MET   4.  Patient will be able to ascend/descend stairs with reciprocal pattern. Baseline: step to with crutches  Goal status: MET   LONG TERM GOALS: Target date: 10/04/23     Patient will score >/= 40% on the LEFS (MCID is 9) to signify clinically meaningful improvement in functional abilities.    Baseline: 13.8%  09/03/23: 26/80 = 35.0% Goal status: IN PROGRESS   2.  Patient will demonstrate at least 4/5 R hip strength to improve stability with walking/standing activity.  Baseline: deferred  Goal status: INITIAL   3.  Patient will demonstrate functional and pain free R hip ROM.  Baseline: see PROM above  Goal status: INITIAL   4.  Patient will be able to ambulate community distances without AD without hip pain.   Baseline: see gait above  Goal status: INITIAL   5.  Patient will be independent with advanced home program to progress/maintain current level of function.  Baseline: initial HEP issued  Goal status: INITIAL   6.  Patient will maintain SLS on the RLE for at least 10 seconds to improve gait stability.  Baseline: unable  Goal status: INITIAL  PLAN:  PT FREQUENCY: 2x/week  PT DURATION: 8 weeks  PLANNED INTERVENTIONS: [id number]- PT Re-evaluation, 97110-Therapeutic exercises, 97530- Therapeutic activity, 97112- Neuromuscular re-education, 97535- Self Care, [id number]- Manual therapy, [phone]- Gait training, Patient/Family education, Balance training, Taping, Dry Needling, Cryotherapy, and Moist heat  PLAN FOR NEXT SESSION: Progress via protocol.    Kendra Woolford, PT 09/14/2023, 10:14 AM

## 2023-09-14 NOTE — ED Triage Notes (Addendum)
 Since last night has had sharp left lower abdominal pain. No n/v/d. No fever. While urinating pain increases. No otc meds. Recently stopped vesicare .

## 2023-09-14 NOTE — ED Provider Notes (Signed)
 [facility]    CSN: [id number] Arrival date & time: 09/14/23  1140      History   Chief Complaint Chief Complaint  Patient presents with   Abdominal Pain    HPI Leslie Roberson is a 68 y.o. female.   HPI  Past Medical History:  Diagnosis Date   Allergy 1992   Anxiety    Arthritis    right little finger; base of thumb left hand; spine (07/07/2017)   Asthma, chronic 11/27/2015   Cataract 2021   Chronic back pain    Coronary artery disease    4/19 PCI/DEx2 to LAD, and PCI/DES x1 to RCA, normal EF   Depression    Diabetes mellitus without complication (HCC) 2020   Diverticulitis of colon 12/21/2015   Colonoscopy every 5 years/digestive health.    GERD (gastroesophageal reflux disease) 11/27/2015   History of blood transfusion    related to spinal fusions   Hyperlipidemia LDL goal <70 11/29/2018   Hypertension    Hypothyroidism    Idiopathic scoliosis 01/22/2016   Osteoporosis 11/27/2015   On prolia .    Pneumonia 2016   Pneumothorax 06/26/2017   Pre-diabetes    Sleep apnea 2020   does not use CPAP   Thyroid  disease     Patient Active Problem List   Diagnosis Date Noted   Right hip impingement syndrome 07/30/2023   Toe blister without infection, left, initial encounter 06/02/2023   Routine health maintenance 09/08/2022   Need for antibiotic prophylaxis for dental procedure 08/05/2022   Sinobronchitis 07/29/2022   Community acquired pneumonia of left lower lobe of lung 12/11/2021   Closed dislocation of metacarpophalangeal joint of right thumb 11/18/2021   Mass of right axilla 05/15/2021   Ear fullness, left 04/08/2021   Bilateral hearing loss 04/05/2021   Controlled type 2 diabetes mellitus with complication, without long-term current use of insulin (HCC) 11/16/2020   Paroxysmal nocturnal dyspnea 05/14/2020   Orthopnea 05/14/2020   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   Status post hip replacement, left 07/19/2019   Left hip  pain 07/19/2019   OSA on CPAP 05/20/2019   Snoring 05/02/2019   Non-restorative sleep 05/02/2019   Migraine without aura and without status migrainosus, not intractable 02/08/2019   OAB (overactive bladder) 02/08/2019   Diabetes mellitus without complication (HCC) 02/07/2019   Hyperlipidemia LDL goal <70 11/29/2018   DJD (degenerative joint disease), lumbosacral 11/29/2018   Pre-operative clearance 11/29/2018   Arthralgia 02/05/2018   Excessive sweating 09/22/2017   Tongue mass 08/14/2017   Angina, class III (HCC) 07/07/2017   CAD S/P percutaneous coronary angioplasty    Pneumothorax 06/25/2017   Itchy scalp 05/24/2017   Right leg pain 02/06/2017   Chronic tension-type headache, intractable 02/05/2017   Prediabetes 02/05/2017   Dysphagia 01/27/2017   No energy 09/15/2016   Paresthesia 04/03/2016   Facet hypertrophy of lumbosacral region 04/03/2016   Chronic back pain 04/03/2016   Right knee pain 02/19/2016   Rosacea 02/04/2016   Vaginal atrophy 01/31/2016   Idiopathic scoliosis 01/22/2016   Hip bursitis 12/25/2015   Biceps tendonitis on right 12/25/2015   Diverticulitis of colon 12/21/2015   GERD (gastroesophageal reflux disease) 11/27/2015   Osteoporosis 11/27/2015   GAD (generalized anxiety disorder) 11/27/2015   MDD (major depressive disorder), recurrent, in full remission (HCC) 11/27/2015   Chronic dryness of both eyes 11/27/2015   Acquired hypothyroidism 11/27/2015   Asthma, chronic 11/27/2015   Insomnia 11/27/2015   Seborrheic keratoses 11/27/2015  CAD in native artery 11/27/2015    Past Surgical History:  Procedure Laterality Date   ABDOMINAL HYSTERECTOMY     bladder mesh     S/P bladder sling   COLON SURGERY  2007   CORONARY PRESSURE/FFR STUDY N/A 07/07/2017   Procedure: INTRAVASCULAR PRESSURE WIRE/FFR STUDY;  Surgeon: Arleen Lacer, MD;  Location: [facility] INVASIVE CV LAB;  Service: Cardiovascular;  Laterality: N/A;   CORONARY STENT INTERVENTION N/A  07/07/2017   Procedure: CORONARY STENT INTERVENTION;  Surgeon: Arleen Lacer, MD;  Location: [facility] INVASIVE CV LAB;  Service: Cardiovascular;  Laterality: N/A;   ELBOW SURGERY Right    dislocation   HIP ARTHROSCOPY Right 07/30/2023   Procedure: ARTHROSCOPY HIP;  Surgeon: Wilhelmenia Harada, MD;  Location: [facility];  Service: Orthopedics;  Laterality: Right;  RIGHT HIP ARTHROSCOPY WITH PINCER DEBRIDEMENT   INCONTINENCE SURGERY     didn't work   JOINT REPLACEMENT     LEFT HEART CATH AND CORONARY ANGIOGRAPHY N/A 07/07/2017   Procedure: LEFT HEART CATH AND CORONARY ANGIOGRAPHY;  Surgeon: Arleen Lacer, MD;  Location: [facility] INVASIVE CV LAB;  Service: Cardiovascular;  Laterality: N/A;   SMALL INTESTINE SURGERY     SPINAL FUSION  ~ 1970 X 3   below neck - lower back   TONSILLECTOMY     TOTAL HIP ARTHROPLASTY Left     OB History   No obstetric history on file.      Home Medications    Prior to Admission medications   Medication Sig Start Date End Date Taking? Authorizing Provider  amoxicillin -clavulanate (AUGMENTIN ) 875-125 MG tablet Take 1 tablet by mouth every 12 (twelve) hours. 09/14/23  Yes Stephany Ehrich, MD  albuterol  (VENTOLIN  HFA) [phone] Base) MCG/ACT inhaler INHALE 1-2 PUFFS INTO THE LUNGS EVERY 4 HOURS AS NEEDED FOR WHEEZING OR SHORTNESS OF BREATH. 01/19/23   Breeback, Jade L, PA-C  aspirin  EC 325 MG tablet Take 1 tablet (325 mg total) by mouth daily. 07/30/23   Wilhelmenia Harada, MD  atorvastatin  (LIPITOR) 20 MG tablet Take 1 tablet (20 mg total) by mouth daily. 11/26/22   Lenise Quince, MD  brexpiprazole (REXULTI) 1 MG TABS tablet Take 1 mg by mouth daily.    [provider]  busPIRone  (BUSPAR ) 15 MG tablet Take 60 mg by mouth daily.    [provider]  calcium -vitamin D  (OSCAL WITH D) 500-200 MG-UNIT tablet Take 1 tablet by mouth daily.    [provider]  denosumab  (PROLIA ) 60 MG/ML SOLN injection Inject 60 mg into the skin  every 6 (six) months. Administer in upper arm, thigh, or abdomen    [provider]  desvenlafaxine  (PRISTIQ ) 100 MG 24 hr tablet Take 100 mg by mouth in the morning.    [provider]  Empagliflozin-metFORMIN  HCl ER (SYNJARDY  XR) 25-1000 MG TB24 Take 1 tablet by mouth daily. 09/02/23   Breeback, Jade L, PA-C  fluticasone  (FLONASE ) 50 MCG/ACT nasal spray SPRAY 2 SPRAYS INTO EACH NOSTRIL EVERY DAY 01/21/23   Breeback, Jade L, PA-C  gabapentin  (NEURONTIN ) 300 MG capsule TAKE 1 CAPSULE IN THE MORNING AND 2 CAPSULES AT BEDTIME Patient taking differently: TAKE  2 CAPSULES AT BEDTIME 06/22/23   Breeback, Jade L, PA-C  ipratropium-albuterol  (DUONEB) 0.5-2.5 (3) MG/3ML SOLN Take 3 mLs by nebulization every 2 (two) hours as needed (wheeze, SOB). 12/30/22   Breeback, Jade L, PA-C  ketoconazole (NIZORAL) 2 % shampoo Apply topically. 08/09/21   [provider]  levothyroxine  (  SYNTHROID ) 137 MCG tablet TAKE 1 TABLET ONCE A WEEK THEN 1/2 TABLET ALL OTHER DAYS AS DIRECTED 06/03/23   Breeback, Jade L, PA-C  Menthol, Topical Analgesic, (MINERAL ICE EX) Apply 1 application to affected sites one to two times a day as needed (for pain)    [provider]  metoprolol  succinate (TOPROL -XL) 25 MG 24 hr tablet TAKE 1 TABLET (25 MG TOTAL) BY MOUTH DAILY. 11/26/22   Lenise Quince, MD  nitroGLYCERIN  (NITROSTAT ) 0.4 MG SL tablet DISSOLVE 1 TAB UNDER THE TONGUE FOR CHEST PAIN EVERY 5 MINUTES UP TO 3 DOSES IF PAIN PERSIST CALL 911/SEEK MEDICAL HELP 12/23/21   Jude Norton, NP  Omega-3 Fatty Acids (FISH OIL PO) Take 1 capsule by mouth daily.     [provider]  pantoprazole  (PROTONIX ) 40 MG tablet TAKE 1 TABLET EVERY DAY 01/19/23   Breeback, Jade L, PA-C  trazodone  (DESYREL ) 300 MG tablet Take 300 mg by mouth at bedtime. 06/26/22   [provider]    Family History Family History  Problem Relation Age of Onset   Cancer Mother        lung   Depression Mother    Early death  Mother    Heart attack Father    Hyperlipidemia Father    Hypertension Father    Arthritis Father    Heart disease Father    Diabetes Maternal Aunt    Hyperlipidemia Paternal Aunt    COPD Paternal Aunt    Hypertension Paternal Aunt    Stroke Paternal Aunt     Social History Social History   Tobacco Use   Smoking status: Never   Smokeless tobacco: Never  Vaping Use   Vaping status: Never Used  Substance Use Topics   Alcohol use: Yes    Comment: Maybe 3 drinks/year   Drug use: No     Allergies   Hydromorphone, Levofloxacin, Meperidine, Broccoli [brassica oleracea], Zaleplon, and Adhesive [tape]   Review of Systems Review of Systems   Physical Exam Triage Vital Signs ED Triage Vitals  Encounter Vitals Group     BP 09/14/23 1145 120/75     Girls Systolic BP Percentile --      Girls Diastolic BP Percentile --      Boys Systolic BP Percentile --      Boys Diastolic BP Percentile --      Pulse Rate 09/14/23 1145 78     Resp 09/14/23 1145 16     Temp 09/14/23 1145 98.4 F (36.9 C)     Temp Source 09/14/23 1145 Oral     SpO2 09/14/23 1145 94 %     Weight --      Height --      Head Circumference --      Peak Flow --      Pain Score 09/14/23 1149 7     Pain Loc --      Pain Education --      Exclude from Growth Chart --    No data found.  Updated Vital Signs BP 120/75   Pulse 78   Temp 98.4 F (36.9 C) (Oral)   Resp 16   SpO2 94%   Visual Acuity Right Eye Distance:   Left Eye Distance:   Bilateral Distance:    Right Eye Near:   Left Eye Near:    Bilateral Near:     Physical Exam   UC Treatments / Results  Labs (all labs ordered are listed, but only  abnormal results are displayed) Labs Reviewed  POCT URINALYSIS DIP (MANUAL ENTRY) - Abnormal; Notable for the following components:      Result Value   Glucose, UA >=1,000 (*)    Ketones, POC UA trace (5) (*)    All other components within normal limits  POCT FASTING CBG [facility] MANUAL ENTRY -  Abnormal; Notable for the following components:   POCT Glucose ([facility]) 103 (*)    All other components within normal limits    EKG   Radiology DG Abdomen 1 View Result Date: 09/14/2023 CLINICAL DATA:  Left lower quadrant abdominal pain, history of diverticulosis. EXAM: ABDOMEN - 1 VIEW COMPARISON:  Radiographs 12/25/2019 FINDINGS: Dextroconvex thoracolumbar scoliosis along with thoracolumbar spondylosis. Right SI joint fusion and left total hip prosthesis. Prominent stool throughout the colon favors constipation. There is evidence coronary artery disease and a coronary stent is present. IMPRESSION: 1. Prominent stool throughout the colon favors constipation. 2. Dextroconvex thoracolumbar scoliosis and thoracolumbar spondylosis. 3. Coronary artery disease. Electronically Signed   By: Freida Jes M.D.   On: 09/14/2023 13:03    Procedures Procedures (including critical care time)  Medications Ordered in UC Medications - No data to display  Initial Impression / Assessment and Plan / UC Course  I have reviewed the triage vital signs and the nursing notes.  Pertinent labs & imaging results that were available during my care of the patient were reviewed by me and considered in my medical decision making (see chart for details).     Discussed that her recent use of the bladder medications may have caused constipation.  She has stopped them at this time.  Discussed fiber, fluids, constipation prevention. Because there exists the possibility of diverticulosis/diverticulitis and giving her antibiotics in addition.  Trying to prevent an ER visit.  She knows she can stop the use if her pain improves Final Clinical Impressions(s) / UC Diagnoses   Final diagnoses:  Abdominal pain, acute, left lower quadrant  Drug-induced constipation  Abdominal pain, left lower quadrant  Diverticulosis of colon without hemorrhage     Discharge Instructions      Take a dose of MiraLAX when you get home.   Repeat if you have not had a bowel movement by bedtime Make sure you are drinking lots of water Take Augmentin  2 times a day.  This is to help with any diverticulitis.  May stop the antibiotic if your pain improves after laxative See your usual doctor in follow-up    ED Prescriptions     Medication Sig Dispense Auth. Provider   amoxicillin -clavulanate (AUGMENTIN ) 875-125 MG tablet Take 1 tablet by mouth every 12 (twelve) hours. 14 tablet Stephany Ehrich, MD      PDMP not reviewed this encounter.   Stephany Ehrich, MD 09/14/23 1336

## 2023-09-14 NOTE — Discharge Instructions (Signed)
 Take a dose of MiraLAX when you get home.  Repeat if you have not had a bowel movement by bedtime Make sure you are drinking lots of water Take Augmentin  2 times a day.  This is to help with any diverticulitis.  May stop the antibiotic if your pain improves after laxative See your usual doctor in follow-up

## 2023-09-16 ENCOUNTER — Ambulatory Visit (HOSPITAL_COMMUNITY): Payer: Self-pay

## 2023-09-16 DIAGNOSIS — R339 Retention of urine, unspecified: Secondary | ICD-10-CM | POA: Diagnosis not present

## 2023-09-16 DIAGNOSIS — N3281 Overactive bladder: Secondary | ICD-10-CM | POA: Diagnosis not present

## 2023-09-16 DIAGNOSIS — R32 Unspecified urinary incontinence: Secondary | ICD-10-CM | POA: Diagnosis not present

## 2023-09-17 ENCOUNTER — Encounter

## 2023-09-21 ENCOUNTER — Encounter: Admitting: Rehabilitative and Restorative Service Providers"

## 2023-09-21 ENCOUNTER — Encounter (HOSPITAL_BASED_OUTPATIENT_CLINIC_OR_DEPARTMENT_OTHER): Payer: Self-pay | Admitting: Physical Therapy

## 2023-09-21 ENCOUNTER — Ambulatory Visit (HOSPITAL_BASED_OUTPATIENT_CLINIC_OR_DEPARTMENT_OTHER): Attending: Orthopaedic Surgery | Admitting: Physical Therapy

## 2023-09-21 DIAGNOSIS — M6281 Muscle weakness (generalized): Secondary | ICD-10-CM | POA: Diagnosis not present

## 2023-09-21 DIAGNOSIS — M25851 Other specified joint disorders, right hip: Secondary | ICD-10-CM | POA: Diagnosis not present

## 2023-09-21 DIAGNOSIS — M25551 Pain in right hip: Secondary | ICD-10-CM | POA: Diagnosis not present

## 2023-09-21 NOTE — Therapy (Signed)
 OUTPATIENT PHYSICAL THERAPY LOWER EXTREMITY TREATMENT  Progress Note Reporting Period 08/05/23 to 09/21/23  See note below for Objective Data and Assessment of Progress/Goals.      Patient Name: Leslie Roberson MRN: [id number] DOB:[birth year], 68 y.o., female Today's Date: 09/21/2023  END OF SESSION:  PT End of Session - 09/21/23 1058     Visit Number 11    Number of Visits 16    Date for PT Re-Evaluation 10/04/23    Authorization Type [employer]    PT Start Time 1015    PT Stop Time 1058    PT Time Calculation (min) 43 min    Activity Tolerance Patient tolerated treatment well    Behavior During Therapy WFL for tasks assessed/performed           Past Medical History:  Diagnosis Date   Allergy 1992   Anxiety    Arthritis    right little finger; base of thumb left hand; spine (07/07/2017)   Asthma, chronic 11/27/2015   Cataract 2021   Chronic back pain    Coronary artery disease    4/19 PCI/DEx2 to LAD, and PCI/DES x1 to RCA, normal EF   Depression    Diabetes mellitus without complication (HCC) 2020   Diverticulitis of colon 12/21/2015   Colonoscopy every 5 years/digestive health.    GERD (gastroesophageal reflux disease) 11/27/2015   History of blood transfusion    related to spinal fusions   Hyperlipidemia LDL goal <70 11/29/2018   Hypertension    Hypothyroidism    Idiopathic scoliosis 01/22/2016   Osteoporosis 11/27/2015   On prolia .    Pneumonia 2016   Pneumothorax 06/26/2017   Pre-diabetes    Sleep apnea 2020   does not use CPAP   Thyroid  disease    Past Surgical History:  Procedure Laterality Date   ABDOMINAL HYSTERECTOMY     bladder mesh     S/P bladder sling   COLON SURGERY  2007   CORONARY PRESSURE/FFR STUDY N/A 07/07/2017   Procedure: INTRAVASCULAR PRESSURE WIRE/FFR STUDY;  Surgeon: Anner Alm ORN, MD;  Location: [facility] INVASIVE CV [facility];  Service: Cardiovascular;  Laterality: N/A;   CORONARY STENT INTERVENTION N/A 07/07/2017    Procedure: CORONARY STENT INTERVENTION;  Surgeon: Anner Alm ORN, MD;  Location: [facility] CV [facility];  Service: Cardiovascular;  Laterality: N/A;   ELBOW SURGERY Right    dislocation   HIP ARTHROSCOPY Right 07/30/2023   Procedure: ARTHROSCOPY HIP;  Surgeon: Genelle Standing, MD;  Location: [facility];  Service: Orthopedics;  Laterality: Right;  RIGHT HIP ARTHROSCOPY WITH PINCER DEBRIDEMENT   INCONTINENCE SURGERY     didn't work   JOINT REPLACEMENT     LEFT HEART CATH AND CORONARY ANGIOGRAPHY N/A 07/07/2017   Procedure: LEFT HEART CATH AND CORONARY ANGIOGRAPHY;  Surgeon: Anner Alm ORN, MD;  Location: [facility] INVASIVE CV LAB;  Service: Cardiovascular;  Laterality: N/A;   SMALL INTESTINE SURGERY     SPINAL FUSION  ~ 1970 X 3   below neck - lower back   TONSILLECTOMY     TOTAL HIP ARTHROPLASTY Left    Patient Active Problem List   Diagnosis Date Noted   Right hip impingement syndrome 07/30/2023   Toe blister without infection, left, initial encounter 06/02/2023   Routine health maintenance 09/08/2022   Need for antibiotic prophylaxis for dental procedure 08/05/2022   Sinobronchitis 07/29/2022   Community acquired pneumonia of left lower lobe of lung 12/11/2021   Closed dislocation of metacarpophalangeal joint of  right thumb 11/18/2021   Mass of right axilla 05/15/2021   Ear fullness, left 04/08/2021   Bilateral hearing loss 04/05/2021   Controlled type 2 diabetes mellitus with complication, without long-term current use of insulin (HCC) 11/16/2020   Paroxysmal nocturnal dyspnea 05/14/2020   Orthopnea 05/14/2020   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   Status post hip replacement, left 07/19/2019   Left hip pain 07/19/2019   OSA on CPAP 05/20/2019   Snoring 05/02/2019   Non-restorative sleep 05/02/2019   Migraine without aura and without status migrainosus, not intractable 02/08/2019   OAB (overactive bladder) 02/08/2019   Diabetes mellitus without  complication (HCC) 02/07/2019   Hyperlipidemia LDL goal <70 11/29/2018   DJD (degenerative joint disease), lumbosacral 11/29/2018   Pre-operative clearance 11/29/2018   Arthralgia 02/05/2018   Excessive sweating 09/22/2017   Tongue mass 08/14/2017   Angina, class III (HCC) 07/07/2017   CAD S/P percutaneous coronary angioplasty    Pneumothorax 06/25/2017   Itchy scalp 05/24/2017   Right leg pain 02/06/2017   Chronic tension-type headache, intractable 02/05/2017   Prediabetes 02/05/2017   Dysphagia 01/27/2017   No energy 09/15/2016   Paresthesia 04/03/2016   Facet hypertrophy of lumbosacral region 04/03/2016   Chronic back pain 04/03/2016   Right knee pain 02/19/2016   Rosacea 02/04/2016   Vaginal atrophy 01/31/2016   Idiopathic scoliosis 01/22/2016   Hip bursitis 12/25/2015   Biceps tendonitis on right 12/25/2015   Diverticulitis of colon 12/21/2015   GERD (gastroesophageal reflux disease) 11/27/2015   Osteoporosis 11/27/2015   GAD (generalized anxiety disorder) 11/27/2015   MDD (major depressive disorder), recurrent, in full remission (HCC) 11/27/2015   Chronic dryness of both eyes 11/27/2015   Acquired hypothyroidism 11/27/2015   Asthma, chronic 11/27/2015   Insomnia 11/27/2015   Seborrheic keratoses 11/27/2015   CAD in native artery 11/27/2015    PCP: Vermell Bologna, PA REFERRING PROVIDER: Elspeth Parker, MD REFERRING DIAG:  Diagnosis  M25.851 (ICD-10-CM) - Right hip impingement syndrome   THERAPY DIAG:  Muscle weakness (generalized)  Pain in right hip  Rationale for Evaluation and Treatment: Rehabilitation  ONSET DATE: Date of Surgery: 07/30/2023   SUBJECTIVE:  SUBJECTIVE STATEMENT: Pt reports there is still a sharp pain into the lateral hip that still occurs. Pain occurs at night from laying on the side. Hurts to touch. Pt is 7 wks post op.   EVAL: The patient has a multi-year h/o R hip pain-- and underwent R hip arthroscopy with pincer debridement and labral  repair. She presents s/p surgery with a standard walker (using the one she had at home) so she is carrying the device. She does not have a brace for night (verified with Evaristo Dawson from Dr. Danetta office  to ensure this is correct). Patient has pain that is worse when laying down.   UPCOMING PROCEDURE: patient is having botox in her bladder and demonstrates she will need to be able to lift her R leg minimally during procedure.   PERTINENT HISTORY: Diabetes, HTN, hearing loss, CAD, h/o SI surgery in 2023 (pin) PAIN:  Are you having pain? Yes: NPRS scale: 710 in R  lateral hip Pain location: R hip front/back Pain description: soreness, aching Aggravating factors: laying down-- hard to get comfortable Relieving factors: unsure  PRECAUTIONS: Other: hip arthroscopic procedure   *on 5/23, able to progress to post 3 weeks and begin further progression into protocol*  WEIGHT BEARING RESTRICTIONS: Yes WBAT  FALLS:  Has patient fallen in last 6 months?  No  LIVING ENVIRONMENT: Lives with: lives with their spouse Lives in: House/apartment Stairs: one step entry- level indoors Has following equipment at home: standard walker-- carries the walker as she moves throughut clinic  PATIENT GOALS: reduce hip pain, return to daily activities  NEXT MD VISIT: 09/11/23  OBJECTIVE:  Note: Objective measures were completed at Evaluation unless otherwise noted. PATIENT SURVEYS:  LEFS 13.8%  6/23 LEFS 27/80  POSTURE: h/o scoliosis with R rib hump in thoracic spine, C curve concave to the L, and hip asymmetry-- L hip elevated as compared to the R hip   LOWER EXTREMITY ROM: *when patient moves into supine, she goes into end range ER and has >60 deg bilat. PT educated her that we want to prop with a pillow at night to prevent excessive hip ERr Passive ROM Right eval Left eval Right 09/07/23 R 6/23 PROM  Hip flexion 90  105 AROM 110 p!  Hip extension 0   0  Hip abduction 30     Hip adduction    30   Hip internal rotation   20 deg PROM 15  Hip external rotation *excessive Goes into 60+ deg in supine-- PT educated to limit to 20 degrees using pillow prop    35  Knee flexion      Knee extension      Ankle dorsiflexion      Ankle plantarflexion      Ankle inversion      Ankle eversion       (Blank rows = not tested)  LOWER EXTREMITY MMT: Deferred due to post surgical status MMT Right eval R 6/23 Left eval  Hip flexion  4/5 p!   Hip extension  4-/5   Hip abduction  4/5 p!   Hip adduction     Hip internal rotation     Hip external rotation     Knee flexion     Knee extension     Ankle dorsiflexion     Ankle plantarflexion     Ankle inversion     Ankle eversion      (Blank rows = not tested)  FUNCTIONAL TESTS:  Sit<>stand independently to device without UE support  GAIT: Distance walked: 50 ft Assistive device utilized: None Level of assistance: Independent Comments: L trunk lean, compensated Trendelenburg  [facility] Adult PT Treatment:                                                  DATE: 09/21/23   Mulligan hip mob lateral and inf at 90 grade III- R hip LAD in open pack with Mulligan belt 2x rounds 3 mins  Supine bridge with GTB 2x10 Supine piriformis 30s 3x SKTC 30s 3x Stool hip IR/ER 2x20 STS with GTB at knees 2x10 (mid thigh high)  Supine hip IR/ER (shoulder width LTR) 10x  Standing hip flexor stretch 30s 3x  Anatomy of condition, pain management, HEP compliance and frequency   [facility] Adult PT Treatment:                                                DATE: 09/10/2023 Therapeutic Exercise: AAROM hip flexion with strap  Prone: Knee flexion/extension AROM Hip ER/IR AAROM S/L clamshells  2x10 Neuromuscular re-ed: Bent knee fall out x15 (bil) --> tactile cue to improve proprioceptive awareness Bridges + ball b/w knees  Staggered stance bridge x10 Quadruped: Rocking Posterior pelvic tilts Prone hip extension 2x10 --> 2 pillows under hips + folded towel  under Rt hip  Modified 1/2 kneel: 1#MB diagonal chops (bil) 1#paloff press  Rt SLB x30  Manual Therapy: IASTM Rt glutes   [facility] Adult PT Treatment:                                                DATE: 09/07/23 Therapeutic Exercise: Sidelying Hip abduction x 10 reps x 2 sets Supine PROM hip IR/ER Quadriped  Anterior/posterior rocking Gets to 115 degrees of hip flexion with posterior rocking Standing Step ups lateral x 10 reps R side Anterior x 10 reps with fatigue lateral hip R side Reciprocal up/up, down/down x 10 reps Press down into L sided pole (for L trunk elongation) while pulling down with yellow band with R UE for core engagement Press down into L sided pole (for L trunk elongation) while rolling a ball with L LE  Prone (modified with 2 pillows under stomach) Knee flexion x 10 reps x 2 sets PROM hip IR/ER within tolerance (can get to 30 degrees IR in prone) Hip initiation of extension with toe down doing quad sets  x 10 reps x 2 sets Manual Therapy: STM R glut med while in prone Neuromuscular re-ed: Single leg standing R and L sides, decreasing reliance on Ues Marching with alternating holds for single leg support Therapeutic Activity: Marching with one UE support R and L Stepping into shorts-- had patient work on bending down with theraband to initiate dressing activities as this is a challenging task at home Standing in stride position reaching with contralateral UE overhead Gait: X 200 feet x 2 reps with cues for equal stride   [facility] Adult PT Treatment:                                                DATE: 09/03/2023 Therapeutic Exercise: Prone (with 2 pillows under hips): Knee flexion R x 10 Gentle PROM hip IR/ER within protocol allowance HS curls + YTB (light) 2x10 Hip extension (neutral with pillow placement) 2x10 Lateral hip self-massage with 4 ball Neuromuscular re-ed: Standing hip abd --> focus on parallel LE alignment Rt SLB + HHAx1 Weight shifting in  standing performing rolling a ball up/down wall (Rt), YTB pull up (Lt) and working on trunk elongation Standing abdominal bracing + press down on orange PB --> standing with back against wall Modified 1/2 kneeling (opp shin supported on low table) + alt diagonal reaches with 1#MB --> 0# ball Therapeutic Activity: Side stepping Diagonal step together, reciprocal stepping Step up/down reciprocal stepping on 4 step + bilateral railing   PATIENT EDUCATION:  Education details: Updated HEP  Person educated: Patient Education method: Explanation, Demonstration, and Handouts Education comprehension: verbalized understanding, returned demonstration, and needs further education  HOME EXERCISE PROGRAM: Access Code: [email or web address] URL: [email or web address] Date: 08/26/2023 Prepared by: Lamarr Price  Program Notes Avoid sitting for more than 20-30 minutes at a time.Since you are not comfortable on your stomach, lay flat with legs out (prop a folded pillow  beside the R foot to prevent it turning out) for 15 minutes, 4 times/day to ensure the hip does not get tight.Do not bend past 90 degrees and AVOID straight leg raise.All exercises should be pain free.  Exercises - Heel Raises with Counter Support  - 2 x daily - 7 x weekly - 1 sets - 10 reps - Standing Alternating Knee Flexion  - 2 x daily - 7 x weekly - 1 sets - 10 reps - Modified Cat Cow on Counter  - 1 x daily - 7 x weekly - 3 sets - 10 reps - Standing Glute Med Mobilization with Small Ball on Wall  - 2 x daily - 5 x weekly - 1 sets - 10 reps - Standing Knee Flexion AROM with Chair Support  - 1 x daily - 7 x weekly - 3 sets - 10 reps - Supine Bridge  - 1 x daily - 7 x weekly - 3 sets - 10 reps  ASSESSMENT: CLINICAL IMPRESSION:  Pt 7 wks post op at this time. Pt responded well to joint mobilizations and progressive hip ROM. Pt still functionally limited with transfers, sleeping, and community ambulation. Pt pain appears largely  related to her continued R hip joint stiffness as well as soft tissue irritation of the glute. Pt responded well to treatment session with report of pain decreasing from 7/10 to 5/10. Pt is progressing with therapy and progreessing towards goals. Pt would benefit from continued skilled therapy in order to reach goals and maximize functional R hip strength and ROM for return to normalized ADL, pain, and community mobility.    EVAL: Patient is a 68 y.o. female who was seen today for physical therapy evaluation and treatment S/P R hip arthroscopic surgery. She presents with impairments in hip ROM, muscle strength, functional mobility, gait speed, and balance. She also has h/o scoliosis which leads to asymmetry. Patient presents today with excessive ROM into ER and PT reviewed limitations of ROM at this time. Patient will benefit from PT services to improve strength, progress ambulation, and return to ADLs and IADLs.  OBJECTIVE IMPAIRMENTS: Abnormal gait, decreased activity tolerance, decreased balance, decreased endurance, decreased ROM, decreased strength, hypomobility, increased fascial restrictions, impaired flexibility, postural dysfunction, and pain.   GOALS: Goals reviewed with patient? Yes  SHORT TERM GOALS: Target date: 09/04/23    Patient will be independent and compliant with initial HEP.    Baseline: issued at eval  Goal status: MET   2.  Patient will demonstrate step through gait pattern without AD.  Baseline: step to with crutches Goal status: MET on 08/21/23   3.  Patient will demonstrate at least 90 degrees of active hip flexion ROM to improve ability to complete squatting activity.  Baseline: see PROM above 09/03/23: 90 degrees AROM Goal status: MET   4.  Patient will be able to ascend/descend stairs with reciprocal pattern. Baseline: step to with crutches  Goal status: MET   LONG TERM GOALS: Target date: 10/04/23     Patient will score >/= 40% on the LEFS (MCID is 9) to signify  clinically meaningful improvement in functional abilities.    Baseline: 13.8%  09/03/23: 26/80 = 35.0% Goal status: IN PROGRESS   2.  Patient will demonstrate at least 4/5 R hip strength to improve stability with walking/standing activity.  Baseline: deferred  Goal status: ongoing   3.  Patient will demonstrate functional and pain free R hip ROM.  Baseline: see PROM above  Goal status: ongoing  4.  Patient will be able to ambulate community distances without AD without hip pain.  Baseline: see gait above  Goal status: ongoing   5.  Patient will be independent with advanced home program to progress/maintain current level of function.  Baseline: initial HEP issued  Goal status: ongoing   6.  Patient will maintain SLS on the RLE for at least 10 seconds to improve gait stability.  Baseline: unable  Goal status: ongiong  PLAN:  PT FREQUENCY: 2x/week  PT DURATION: 8 weeks  PLANNED INTERVENTIONS: 97164- PT Re-evaluation, 97110-Therapeutic exercises, 97530- Therapeutic activity, 97112- Neuromuscular re-education, 97535- Self Care, [id number]- Manual therapy, [phone]- Gait training, Patient/Family education, Balance training, Taping, Dry Needling, Cryotherapy, and Moist heat  PLAN FOR NEXT SESSION: Progress via protocol.    Dale Call, PT 09/21/2023, 11:02 AM

## 2023-09-23 ENCOUNTER — Ambulatory Visit (HOSPITAL_BASED_OUTPATIENT_CLINIC_OR_DEPARTMENT_OTHER): Admitting: Physical Therapy

## 2023-09-23 ENCOUNTER — Encounter (HOSPITAL_BASED_OUTPATIENT_CLINIC_OR_DEPARTMENT_OTHER): Payer: Self-pay | Admitting: Physical Therapy

## 2023-09-23 DIAGNOSIS — M6281 Muscle weakness (generalized): Secondary | ICD-10-CM

## 2023-09-23 DIAGNOSIS — M25551 Pain in right hip: Secondary | ICD-10-CM

## 2023-09-23 NOTE — Therapy (Signed)
 OUTPATIENT PHYSICAL THERAPY LOWER EXTREMITY TREATMENT  Progress Note Reporting Period 08/05/23 to 09/21/23  See note below for Objective Data and Assessment of Progress/Goals.      Patient Name: Leslie Roberson MRN: [id number] DOB:[birth year], 68 y.o., female Today's Date: 09/23/2023  END OF SESSION:  PT End of Session - 09/23/23 1443     Visit Number 12    Number of Visits 16    Date for PT Re-Evaluation 10/04/23    Authorization Type [employer]    PT Start Time 1403    PT Stop Time 1442    PT Time Calculation (min) 39 min    Activity Tolerance Patient tolerated treatment well    Behavior During Therapy [facility] for tasks assessed/performed            Past Medical History:  Diagnosis Date   Allergy 1992   Anxiety    Arthritis    right little finger; base of thumb left hand; spine (07/07/2017)   Asthma, chronic 11/27/2015   Cataract 2021   Chronic back pain    Coronary artery disease    4/19 PCI/DEx2 to LAD, and PCI/DES x1 to RCA, normal EF   Depression    Diabetes mellitus without complication (HCC) 2020   Diverticulitis of colon 12/21/2015   Colonoscopy every 5 years/digestive health.    GERD (gastroesophageal reflux disease) 11/27/2015   History of blood transfusion    related to spinal fusions   Hyperlipidemia LDL goal <70 11/29/2018   Hypertension    Hypothyroidism    Idiopathic scoliosis 01/22/2016   Osteoporosis 11/27/2015   On prolia .    Pneumonia 2016   Pneumothorax 06/26/2017   Pre-diabetes    Sleep apnea 2020   does not use CPAP   Thyroid  disease    Past Surgical History:  Procedure Laterality Date   ABDOMINAL HYSTERECTOMY     bladder mesh     S/P bladder sling   COLON SURGERY  2007   CORONARY PRESSURE/FFR STUDY N/A 07/07/2017   Procedure: INTRAVASCULAR PRESSURE WIRE/FFR STUDY;  Surgeon: Anner Alm ORN, MD;  Location: [facility] INVASIVE CV LAB;  Service: Cardiovascular;  Laterality: N/A;   CORONARY STENT INTERVENTION N/A 07/07/2017    Procedure: CORONARY STENT INTERVENTION;  Surgeon: Anner Alm ORN, MD;  Location: [facility] INVASIVE CV LAB;  Service: Cardiovascular;  Laterality: N/A;   ELBOW SURGERY Right    dislocation   HIP ARTHROSCOPY Right 07/30/2023   Procedure: ARTHROSCOPY HIP;  Surgeon: Genelle Standing, MD;  Location: [facility];  Service: Orthopedics;  Laterality: Right;  RIGHT HIP ARTHROSCOPY WITH PINCER DEBRIDEMENT   INCONTINENCE SURGERY     didn't work   JOINT REPLACEMENT     LEFT HEART CATH AND CORONARY ANGIOGRAPHY N/A 07/07/2017   Procedure: LEFT HEART CATH AND CORONARY ANGIOGRAPHY;  Surgeon: Anner Alm ORN, MD;  Location: [facility] INVASIVE CV LAB;  Service: Cardiovascular;  Laterality: N/A;   SMALL INTESTINE SURGERY     SPINAL FUSION  ~ 1970 X 3   below neck - lower back   TONSILLECTOMY     TOTAL HIP ARTHROPLASTY Left    Patient Active Problem List   Diagnosis Date Noted   Right hip impingement syndrome 07/30/2023   Toe blister without infection, left, initial encounter 06/02/2023   Routine health maintenance 09/08/2022   Need for antibiotic prophylaxis for dental procedure 08/05/2022   Sinobronchitis 07/29/2022   Community acquired pneumonia of left lower lobe of lung 12/11/2021   Closed dislocation of metacarpophalangeal joint  of right thumb 11/18/2021   Mass of right axilla 05/15/2021   Ear fullness, left 04/08/2021   Bilateral hearing loss 04/05/2021   Controlled type 2 diabetes mellitus with complication, without long-term current use of insulin (HCC) 11/16/2020   Paroxysmal nocturnal dyspnea 05/14/2020   Orthopnea 05/14/2020   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   Status post hip replacement, left 07/19/2019   Left hip pain 07/19/2019   OSA on CPAP 05/20/2019   Snoring 05/02/2019   Non-restorative sleep 05/02/2019   Migraine without aura and without status migrainosus, not intractable 02/08/2019   OAB (overactive bladder) 02/08/2019   Diabetes mellitus without  complication (HCC) 02/07/2019   Hyperlipidemia LDL goal <70 11/29/2018   DJD (degenerative joint disease), lumbosacral 11/29/2018   Pre-operative clearance 11/29/2018   Arthralgia 02/05/2018   Excessive sweating 09/22/2017   Tongue mass 08/14/2017   Angina, class III (HCC) 07/07/2017   CAD S/P percutaneous coronary angioplasty    Pneumothorax 06/25/2017   Itchy scalp 05/24/2017   Right leg pain 02/06/2017   Chronic tension-type headache, intractable 02/05/2017   Prediabetes 02/05/2017   Dysphagia 01/27/2017   No energy 09/15/2016   Paresthesia 04/03/2016   Facet hypertrophy of lumbosacral region 04/03/2016   Chronic back pain 04/03/2016   Right knee pain 02/19/2016   Rosacea 02/04/2016   Vaginal atrophy 01/31/2016   Idiopathic scoliosis 01/22/2016   Hip bursitis 12/25/2015   Biceps tendonitis on right 12/25/2015   Diverticulitis of colon 12/21/2015   GERD (gastroesophageal reflux disease) 11/27/2015   Osteoporosis 11/27/2015   GAD (generalized anxiety disorder) 11/27/2015   MDD (major depressive disorder), recurrent, in full remission (HCC) 11/27/2015   Chronic dryness of both eyes 11/27/2015   Acquired hypothyroidism 11/27/2015   Asthma, chronic 11/27/2015   Insomnia 11/27/2015   Seborrheic keratoses 11/27/2015   CAD in native artery 11/27/2015    PCP: Vermell Bologna, PA REFERRING PROVIDER: Elspeth Parker, MD REFERRING DIAG:  Diagnosis  M25.851 (ICD-10-CM) - Right hip impingement syndrome   THERAPY DIAG:  Muscle weakness (generalized)  Pain in right hip  Rationale for Evaluation and Treatment: Rehabilitation  ONSET DATE: Date of Surgery: 07/30/2023  Days since surgery: 55     SUBJECTIVE:  SUBJECTIVE STATEMENT: Pt reports there is still a sharp pain into the lateral hip that still occurs. Pain occurs at night from laying on the side. Hurts to touch. Pt is 7 wks post op.   EVAL: The patient has a multi-year h/o R hip pain-- and underwent R hip arthroscopy with  pincer debridement and labral repair. She presents s/p surgery with a standard walker (using the one she had at home) so she is carrying the device. She does not have a brace for night (verified with Evaristo Dawson from Dr. Danetta office  to ensure this is correct). Patient has pain that is worse when laying down.   UPCOMING PROCEDURE: patient is having botox in her bladder and demonstrates she will need to be able to lift her R leg minimally during procedure.   PERTINENT HISTORY: Diabetes, HTN, hearing loss, CAD, h/o SI surgery in 2023 (pin) PAIN:  Are you having pain? Yes: NPRS scale: 710 in R  lateral hip Pain location: R hip front/back Pain description: soreness, aching Aggravating factors: laying down-- hard to get comfortable Relieving factors: unsure  PRECAUTIONS: Other: hip arthroscopic procedure   *on 5/23, able to progress to post 3 weeks and begin further progression into protocol*  WEIGHT BEARING RESTRICTIONS: Yes WBAT  FALLS:  Has patient fallen in last 6 months? No  LIVING ENVIRONMENT: Lives with: lives with their spouse Lives in: House/apartment Stairs: one step entry- level indoors Has following equipment at home: standard walker-- carries the walker as she moves throughut clinic  PATIENT GOALS: reduce hip pain, return to daily activities  NEXT MD VISIT: 09/11/23  OBJECTIVE:  Note: Objective measures were completed at Evaluation unless otherwise noted. PATIENT SURVEYS:  LEFS 13.8%  6/23 LEFS 27/80  POSTURE: h/o scoliosis with R rib hump in thoracic spine, C curve concave to the L, and hip asymmetry-- L hip elevated as compared to the R hip   LOWER EXTREMITY ROM: *when patient moves into supine, she goes into end range ER and has >60 deg bilat. PT educated her that we want to prop with a pillow at night to prevent excessive hip ERr Passive ROM Right eval Left eval Right 09/07/23 R 6/23 PROM  Hip flexion 90  105 AROM 110 p!  Hip extension 0   0  Hip  abduction 30     Hip adduction    30  Hip internal rotation   20 deg PROM 15  Hip external rotation *excessive Goes into 60+ deg in supine-- PT educated to limit to 20 degrees using pillow prop    35  Knee flexion      Knee extension      Ankle dorsiflexion      Ankle plantarflexion      Ankle inversion      Ankle eversion       (Blank rows = not tested)  LOWER EXTREMITY MMT: Deferred due to post surgical status MMT Right eval R 6/23 Left eval  Hip flexion  4/5 p!   Hip extension  4-/5   Hip abduction  4/5 p!   Hip adduction     Hip internal rotation     Hip external rotation     Knee flexion     Knee extension     Ankle dorsiflexion     Ankle plantarflexion     Ankle inversion     Ankle eversion      (Blank rows = not tested)  FUNCTIONAL TESTS:  Sit<>stand independently to device without UE support  GAIT: Distance walked: 50 ft Assistive device utilized: None Level of assistance: Independent Comments: L trunk lean, compensated Trendelenburg  [facility] Adult PT Treatment:                                                  DATE: 09/23/23   Mulligan hip mob lateral and inf at 90 grade III- R hip S/L hip ext mob grade III STM R glute  Hip ABD iso 10s 10x red TB at knees Seated lumbar flexion reaching stretch with 6 box under R foot 10s 10x Standing hip flexor stretch (toe out 10 deg) 30s 3x Adductor lunge stretch 30s 3x  DATE: 09/21/23  Mulligan hip mob lateral and inf at 90 grade III- R hip LAD in open pack with Mulligan belt 2x rounds 3 mins  Supine bridge with GTB 2x10 Supine piriformis 30s 3x SKTC 30s 3x Stool hip IR/ER 2x20 STS with GTB at knees 2x10 (mid thigh high)  Supine hip IR/ER (shoulder width LTR) 10x  Standing hip flexor stretch 30s 3x  Anatomy of condition, pain management, HEP compliance and frequency  [facility] Adult PT Treatment:                                                DATE: 09/10/2023 Therapeutic Exercise: AAROM hip flexion with  strap  Prone: Knee flexion/extension AROM Hip ER/IR AAROM S/L clamshells 2x10 Neuromuscular re-ed: Bent knee fall out x15 (bil) --> tactile cue to improve proprioceptive awareness Bridges + ball b/w knees  Staggered stance bridge x10 Quadruped: Rocking Posterior pelvic tilts Prone hip extension 2x10 --> 2 pillows under hips + folded towel under Rt hip  Modified 1/2 kneel: 1#MB diagonal chops (bil) 1#paloff press  Rt SLB x30  Manual Therapy: IASTM Rt glutes   [facility] Adult PT Treatment:                                                DATE: 09/07/23 Therapeutic Exercise: Sidelying Hip abduction x 10 reps x 2 sets Supine PROM hip IR/ER Quadriped  Anterior/posterior rocking Gets to 115 degrees of hip flexion with posterior rocking Standing Step ups lateral x 10 reps R side Anterior x 10 reps with fatigue lateral hip R side Reciprocal up/up, down/down x 10 reps Press down into L sided pole (for L trunk elongation) while pulling down with yellow band with R UE for core engagement Press down into L sided pole (for L trunk elongation) while rolling a ball with L LE  Prone (modified with 2 pillows under stomach) Knee flexion x 10 reps x 2 sets PROM hip IR/ER within tolerance (can get to 30 degrees IR in prone) Hip initiation of extension with toe down doing quad sets  x 10 reps x 2 sets Manual Therapy: STM R glut med while in prone Neuromuscular re-ed: Single leg standing R and L sides, decreasing reliance on Ues Marching with alternating holds for single leg support Therapeutic Activity: Marching with one UE support R and L Stepping into shorts-- had patient work on bending down with theraband to initiate dressing activities as this is a challenging task at home Standing in stride position reaching with contralateral UE overhead Gait: X 200 feet x 2 reps with cues for equal stride   [facility] Adult PT Treatment:                                                DATE:  09/03/2023 Therapeutic Exercise: Prone (with 2 pillows under hips): Knee flexion R x 10 Gentle PROM hip IR/ER within protocol allowance HS curls + YTB (light) 2x10 Hip extension (neutral with pillow placement) 2x10 Lateral hip self-massage with 4 ball Neuromuscular re-ed: Standing hip abd --> focus on parallel LE alignment Rt SLB + HHAx1 Weight shifting in standing performing rolling a ball up/down wall (Rt), YTB pull up (Lt) and working on trunk elongation Standing abdominal bracing + press down on orange PB --> standing with back against wall Modified 1/2 kneeling (opp shin supported on low table) + alt diagonal reaches with 1#MB --> 0# ball Therapeutic Activity: Side stepping Diagonal step together, reciprocal stepping Step up/down reciprocal stepping on 4 step + bilateral railing   PATIENT  EDUCATION:  Education details: Updated HEP  Person educated: Patient Education method: Explanation, Demonstration, and Handouts Education comprehension: verbalized understanding, returned demonstration, and needs further education  HOME EXERCISE PROGRAM: Access Code: [email or web address] URL: [email or web address] Date: 08/26/2023 Prepared by: Lamarr Price  Program Notes Avoid sitting for more than 20-30 minutes at a time.Since you are not comfortable on your stomach, lay flat with legs out (prop a folded pillow beside the R foot to prevent it turning out) for 15 minutes, 4 times/day to ensure the hip does not get tight.Do not bend past 90 degrees and AVOID straight leg raise.All exercises should be pain free.  Exercises - Heel Raises with Counter Support  - 2 x daily - 7 x weekly - 1 sets - 10 reps - Standing Alternating Knee Flexion  - 2 x daily - 7 x weekly - 1 sets - 10 reps - Modified Cat Cow on Counter  - 1 x daily - 7 x weekly - 3 sets - 10 reps - Standing Glute Med Mobilization with Small Ball on Wall  - 2 x daily - 5 x weekly - 1 sets - 10 reps - Standing Knee Flexion AROM  with Chair Support  - 1 x daily - 7 x weekly - 3 sets - 10 reps - Supine Bridge  - 1 x daily - 7 x weekly - 3 sets - 10 reps  ASSESSMENT: CLINICAL IMPRESSION:  Pt nearly 8 wks post op at this time. Pt continues to be largely limited with hip joint mobility which is likely cause of continued lateral hip/groin pain. Pt is not able to tolerate figure 4 in seated with concordant pain sign in attempted stretching position. HEP updated largely for mobility. Plan to continue with mobility and strengthening as per protocol. Pt advised that there are no more ROM restrictions and she should try to progress into all planes in a graded, progressive manner to aid in her hip stiffness. Pt pain reduced from 7/10 to 5/10 again at today's session. Pt would benefit from continued skilled therapy in order to reach goals and maximize functional R hip strength and ROM for return to normalized ADL, pain, and community mobility.    EVAL: Patient is a 68 y.o. female who was seen today for physical therapy evaluation and treatment S/P R hip arthroscopic surgery. She presents with impairments in hip ROM, muscle strength, functional mobility, gait speed, and balance. She also has h/o scoliosis which leads to asymmetry. Patient presents today with excessive ROM into ER and PT reviewed limitations of ROM at this time. Patient will benefit from PT services to improve strength, progress ambulation, and return to ADLs and IADLs.  OBJECTIVE IMPAIRMENTS: Abnormal gait, decreased activity tolerance, decreased balance, decreased endurance, decreased ROM, decreased strength, hypomobility, increased fascial restrictions, impaired flexibility, postural dysfunction, and pain.   GOALS: Goals reviewed with patient? Yes  SHORT TERM GOALS: Target date: 09/04/23    Patient will be independent and compliant with initial HEP.    Baseline: issued at eval  Goal status: MET   2.  Patient will demonstrate step through gait pattern without AD.   Baseline: step to with crutches Goal status: MET on 08/21/23   3.  Patient will demonstrate at least 90 degrees of active hip flexion ROM to improve ability to complete squatting activity.  Baseline: see PROM above 09/03/23: 90 degrees AROM Goal status: MET   4.  Patient will be able to ascend/descend stairs with reciprocal pattern. Baseline: step to with crutches  Goal status: MET   LONG TERM GOALS: Target date: 10/04/23     Patient will score >/= 40% on the LEFS (MCID is 9) to signify clinically meaningful improvement in functional abilities.    Baseline: 13.8%  09/03/23: 26/80 = 35.0% Goal status: IN PROGRESS   2.  Patient will demonstrate at least 4/5 R hip strength to improve stability with walking/standing activity.  Baseline: deferred  Goal status: ongoing   3.  Patient will demonstrate functional and pain free R hip ROM.  Baseline: see PROM above  Goal status: ongoing   4.  Patient will be able to ambulate community distances without AD without hip pain.  Baseline: see gait above  Goal status: ongoing   5.  Patient will be independent with advanced home program to progress/maintain current level of function.  Baseline: initial HEP issued  Goal status: ongoing   6.  Patient will maintain SLS on the RLE for at least 10 seconds to improve gait stability.  Baseline: unable  Goal status: ongiong  PLAN:  PT FREQUENCY: 2x/week  PT DURATION: 8 weeks  PLANNED INTERVENTIONS: [id number]- PT Re-evaluation, 97110-Therapeutic exercises, 97530- Therapeutic activity, 97112- Neuromuscular re-education, 97535- Self Care, [id number]- Manual therapy, [phone]- Gait training, Patient/Family education, Balance training, Taping, Dry Needling, Cryotherapy, and Moist heat  PLAN FOR NEXT SESSION: Progress via protocol.    Dale Call, PT 09/23/2023, 2:43 PM

## 2023-09-29 ENCOUNTER — Encounter (HOSPITAL_BASED_OUTPATIENT_CLINIC_OR_DEPARTMENT_OTHER): Payer: Self-pay | Admitting: Orthopaedic Surgery

## 2023-09-30 ENCOUNTER — Ambulatory Visit: Attending: Orthopaedic Surgery

## 2023-09-30 DIAGNOSIS — R2689 Other abnormalities of gait and mobility: Secondary | ICD-10-CM | POA: Diagnosis not present

## 2023-09-30 DIAGNOSIS — M25551 Pain in right hip: Secondary | ICD-10-CM | POA: Insufficient documentation

## 2023-09-30 DIAGNOSIS — M25541 Pain in joints of right hand: Secondary | ICD-10-CM | POA: Diagnosis not present

## 2023-09-30 DIAGNOSIS — M6281 Muscle weakness (generalized): Secondary | ICD-10-CM | POA: Insufficient documentation

## 2023-09-30 NOTE — Therapy (Signed)
 OUTPATIENT PHYSICAL THERAPY LOWER EXTREMITY TREATMENT     Patient Name: Leslie Roberson MRN: [id number] DOB:[birth year], 68 y.o., female Today's Date: 09/30/2023  END OF SESSION:  PT End of Session - 09/30/23 1359     Visit Number 13    Number of Visits 16    Date for PT Re-Evaluation 10/04/23    Authorization Type [employer]    PT Start Time 1405    PT Stop Time 1445    PT Time Calculation (min) 40 min    Activity Tolerance Patient tolerated treatment well    Behavior During Therapy [facility] for tasks assessed/performed         Past Medical History:  Diagnosis Date   Allergy 1992   Anxiety    Arthritis    right little finger; base of thumb left hand; spine (07/07/2017)   Asthma, chronic 11/27/2015   Cataract 2021   Chronic back pain    Coronary artery disease    4/19 PCI/DEx2 to LAD, and PCI/DES x1 to RCA, normal EF   Depression    Diabetes mellitus without complication (HCC) 2020   Diverticulitis of colon 12/21/2015   Colonoscopy every 5 years/digestive health.    GERD (gastroesophageal reflux disease) 11/27/2015   History of blood transfusion    related to spinal fusions   Hyperlipidemia LDL goal <70 11/29/2018   Hypertension    Hypothyroidism    Idiopathic scoliosis 01/22/2016   Osteoporosis 11/27/2015   On prolia .    Pneumonia 2016   Pneumothorax 06/26/2017   Pre-diabetes    Sleep apnea 2020   does not use CPAP   Thyroid  disease    Past Surgical History:  Procedure Laterality Date   ABDOMINAL HYSTERECTOMY     bladder mesh     S/P bladder sling   COLON SURGERY  2007   CORONARY PRESSURE/FFR STUDY N/A 07/07/2017   Procedure: INTRAVASCULAR PRESSURE WIRE/FFR STUDY;  Surgeon: Anner Alm ORN, MD;  Location: [facility] INVASIVE CV LAB;  Service: Cardiovascular;  Laterality: N/A;   CORONARY STENT INTERVENTION N/A 07/07/2017   Procedure: CORONARY STENT INTERVENTION;  Surgeon: Anner Alm ORN, MD;  Location: [facility] INVASIVE CV LAB;  Service: Cardiovascular;   Laterality: N/A;   ELBOW SURGERY Right    dislocation   HIP ARTHROSCOPY Right 07/30/2023   Procedure: ARTHROSCOPY HIP;  Surgeon: Genelle Standing, MD;  Location: [facility];  Service: Orthopedics;  Laterality: Right;  RIGHT HIP ARTHROSCOPY WITH PINCER DEBRIDEMENT   INCONTINENCE SURGERY     didn't work   JOINT REPLACEMENT     LEFT HEART CATH AND CORONARY ANGIOGRAPHY N/A 07/07/2017   Procedure: LEFT HEART CATH AND CORONARY ANGIOGRAPHY;  Surgeon: Anner Alm ORN, MD;  Location: [facility] INVASIVE CV LAB;  Service: Cardiovascular;  Laterality: N/A;   SMALL INTESTINE SURGERY     SPINAL FUSION  ~ 1970 X 3   below neck - lower back   TONSILLECTOMY     TOTAL HIP ARTHROPLASTY Left    Patient Active Problem List   Diagnosis Date Noted   Right hip impingement syndrome 07/30/2023   Toe blister without infection, left, initial encounter 06/02/2023   Routine health maintenance 09/08/2022   Need for antibiotic prophylaxis for dental procedure 08/05/2022   Sinobronchitis 07/29/2022   Community acquired pneumonia of left lower lobe of lung 12/11/2021   Closed dislocation of metacarpophalangeal joint of right thumb 11/18/2021   Mass of right axilla 05/15/2021   Ear fullness, left 04/08/2021   Bilateral hearing loss 04/05/2021  Controlled type 2 diabetes mellitus with complication, without long-term current use of insulin (HCC) 11/16/2020   Paroxysmal nocturnal dyspnea 05/14/2020   Orthopnea 05/14/2020   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   Status post hip replacement, left 07/19/2019   Left hip pain 07/19/2019   OSA on CPAP 05/20/2019   Snoring 05/02/2019   Non-restorative sleep 05/02/2019   Migraine without aura and without status migrainosus, not intractable 02/08/2019   OAB (overactive bladder) 02/08/2019   Diabetes mellitus without complication (HCC) 02/07/2019   Hyperlipidemia LDL goal <70 11/29/2018   DJD (degenerative joint disease), lumbosacral 11/29/2018    Pre-operative clearance 11/29/2018   Arthralgia 02/05/2018   Excessive sweating 09/22/2017   Tongue mass 08/14/2017   Angina, class III (HCC) 07/07/2017   CAD S/P percutaneous coronary angioplasty    Pneumothorax 06/25/2017   Itchy scalp 05/24/2017   Right leg pain 02/06/2017   Chronic tension-type headache, intractable 02/05/2017   Prediabetes 02/05/2017   Dysphagia 01/27/2017   No energy 09/15/2016   Paresthesia 04/03/2016   Facet hypertrophy of lumbosacral region 04/03/2016   Chronic back pain 04/03/2016   Right knee pain 02/19/2016   Rosacea 02/04/2016   Vaginal atrophy 01/31/2016   Idiopathic scoliosis 01/22/2016   Hip bursitis 12/25/2015   Biceps tendonitis on right 12/25/2015   Diverticulitis of colon 12/21/2015   GERD (gastroesophageal reflux disease) 11/27/2015   Osteoporosis 11/27/2015   GAD (generalized anxiety disorder) 11/27/2015   MDD (major depressive disorder), recurrent, in full remission (HCC) 11/27/2015   Chronic dryness of both eyes 11/27/2015   Acquired hypothyroidism 11/27/2015   Asthma, chronic 11/27/2015   Insomnia 11/27/2015   Seborrheic keratoses 11/27/2015   CAD in native artery 11/27/2015    PCP: Vermell Bologna, PA REFERRING PROVIDER: Elspeth Parker, MD REFERRING DIAG:  Diagnosis  M25.851 (ICD-10-CM) - Right hip impingement syndrome   THERAPY DIAG:  Muscle weakness (generalized)  Pain in right hip  Pain in joint of right hand  Other abnormalities of gait and mobility  Rationale for Evaluation and Treatment: Rehabilitation  ONSET DATE: Date of Surgery: 07/30/2023  Days since surgery: 62     SUBJECTIVE:  SUBJECTIVE STATEMENT: Patient reports her SI pain (Rt side) is back after spending weekend with grandchildren. Patient reports no change in lateral hip pain, states it is a 7/10 pain today. Patient states she is waiting to hear from [facility] on scoliosis referral from Dr. Parker; states MD thinks lateral hip pain is related to spine.    EVAL: The patient has a multi-year h/o R hip pain-- and underwent R hip arthroscopy with pincer debridement and labral repair. She presents s/p surgery with a standard walker (using the one she had at home) so she is carrying the device. She does not have a brace for night (verified with Evaristo Dawson from Dr. Danetta office  to ensure this is correct). Patient has pain that is worse when laying down.   UPCOMING PROCEDURE: patient is having botox in her bladder and demonstrates she will need to be able to lift her R leg minimally during procedure.   PERTINENT HISTORY: Diabetes, HTN, hearing loss, CAD, h/o SI surgery in 2023 (pin) PAIN:  Are you having pain? Yes: NPRS scale: 710 in R  lateral hip Pain location: R hip front/back Pain description: soreness, aching Aggravating factors: laying down-- hard to get comfortable Relieving factors: unsure  PRECAUTIONS: Other: hip arthroscopic procedure   *on 5/23, able to progress to post 3 weeks and begin further progression  into protocol*  WEIGHT BEARING RESTRICTIONS: Yes WBAT  FALLS:  Has patient fallen in last 6 months? No  LIVING ENVIRONMENT: Lives with: lives with their spouse Lives in: House/apartment Stairs: one step entry- level indoors Has following equipment at home: standard walker-- carries the walker as she moves throughut clinic  PATIENT GOALS: reduce hip pain, return to daily activities  NEXT MD VISIT: TBD  OBJECTIVE:  Note: Objective measures were completed at Evaluation unless otherwise noted. PATIENT SURVEYS:  LEFS 13.8%  6/23 LEFS 27/80  POSTURE: h/o scoliosis with R rib hump in thoracic spine, C curve concave to the L, and hip asymmetry-- L hip elevated as compared to the R hip   LOWER EXTREMITY ROM: *when patient moves into supine, she goes into end range ER and has >60 deg bilat. PT educated her that we want to prop with a pillow at night to prevent excessive hip ERr Passive ROM Right eval Left eval  Right 09/07/23 R 6/23 PROM  Hip flexion 90  105 AROM 110 p!  Hip extension 0   0  Hip abduction 30     Hip adduction    30  Hip internal rotation   20 deg PROM 15  Hip external rotation *excessive Goes into 60+ deg in supine-- PT educated to limit to 20 degrees using pillow prop    35  Knee flexion      Knee extension      Ankle dorsiflexion      Ankle plantarflexion      Ankle inversion      Ankle eversion       (Blank rows = not tested)  LOWER EXTREMITY MMT: Deferred due to post surgical status MMT Right eval R 6/23 Left eval Right 09/30/23  Hip flexion  4/5 p!  4+/5  Hip extension  4-/5  4/5  Hip abduction  4/5 p!  4+/5  Hip adduction      Hip internal rotation      Hip external rotation      Knee flexion      Knee extension      Ankle dorsiflexion      Ankle plantarflexion      Ankle inversion      Ankle eversion       (Blank rows = not tested)  FUNCTIONAL TESTS:  Sit<>stand independently to device without UE support  GAIT: Distance walked: 50 ft Assistive device utilized: None Level of assistance: Independent Comments: L trunk lean, compensated Trendelenburg   [facility] Adult PT Treatment:                                                DATE: 09/30/2023 Therapeutic Exercise: New HEP review: SKTC 3x30 (Rt) Modified figure 4 piriformis stretch 3x30 Lateral lunge Add stretch x30 Standing hip flexor stretch 2x30 Hip MMT Neuromuscular re-ed: Bridges + GTB in PPT 2x10 Prone hip extension (2 pillows under stomach) 2x10 Rt SLB  Therapeutic Activity: Walking for distance without pain (178') Wide stance lateral weight shifting LEFS   [facility] Adult PT Treatment:                                                 DATE: 09/23/23 Mulligan hip  mob lateral and inf at 90 grade III- R hip S/L hip ext mob grade III STM R glute  Hip ABD iso 10s 10x red TB at knees Seated lumbar flexion reaching stretch with 6 box under R foot 10s 10x Standing hip flexor stretch (toe  out 10 deg) 30s 3x Adductor lunge stretch 30s 3x   DATE: 09/21/23  Mulligan hip mob lateral and inf at 90 grade III- R hip LAD in open pack with Mulligan belt 2x rounds 3 mins  Supine bridge with GTB 2x10 Supine piriformis 30s 3x SKTC 30s 3x Stool hip IR/ER 2x20 STS with GTB at knees 2x10 (mid thigh high)  Supine hip IR/ER (shoulder width LTR) 10x  Standing hip flexor stretch 30s 3x  Anatomy of condition, pain management, HEP compliance and frequency   [facility] Adult PT Treatment:                                                DATE: 09/10/2023 Therapeutic Exercise: AAROM hip flexion with strap  Prone: Knee flexion/extension AROM Hip ER/IR AAROM S/L clamshells 2x10 Neuromuscular re-ed: Bent knee fall out x15 (bil) --> tactile cue to improve proprioceptive awareness Bridges + ball b/w knees  Staggered stance bridge x10 Quadruped: Rocking Posterior pelvic tilts Prone hip extension 2x10 --> 2 pillows under hips + folded towel under Rt hip  Modified 1/2 kneel: 1#MB diagonal chops (bil) 1#paloff press  Rt SLB x30  Manual Therapy: IASTM Rt glutes   [facility] Adult PT Treatment:                                                DATE: 09/07/23 Therapeutic Exercise: Sidelying Hip abduction x 10 reps x 2 sets Supine PROM hip IR/ER Quadriped  Anterior/posterior rocking Gets to 115 degrees of hip flexion with posterior rocking Standing Step ups lateral x 10 reps R side Anterior x 10 reps with fatigue lateral hip R side Reciprocal up/up, down/down x 10 reps Press down into L sided pole (for L trunk elongation) while pulling down with yellow band with R UE for core engagement Press down into L sided pole (for L trunk elongation) while rolling a ball with L LE  Prone (modified with 2 pillows under stomach) Knee flexion x 10 reps x 2 sets PROM hip IR/ER within tolerance (can get to 30 degrees IR in prone) Hip initiation of extension with toe down doing quad sets  x 10 reps x 2  sets Manual Therapy: STM R glut med while in prone Neuromuscular re-ed: Single leg standing R and L sides, decreasing reliance on Ues Marching with alternating holds for single leg support Therapeutic Activity: Marching with one UE support R and L Stepping into shorts-- had patient work on bending down with theraband to initiate dressing activities as this is a challenging task at home Standing in stride position reaching with contralateral UE overhead Gait: X 200 feet x 2 reps with cues for equal stride   [facility] Adult PT Treatment:  DATE: 09/03/2023 Therapeutic Exercise: Prone (with 2 pillows under hips): Knee flexion R x 10 Gentle PROM hip IR/ER within protocol allowance HS curls + YTB (light) 2x10 Hip extension (neutral with pillow placement) 2x10 Lateral hip self-massage with 4 ball Neuromuscular re-ed: Standing hip abd --> focus on parallel LE alignment Rt SLB + HHAx1 Weight shifting in standing performing rolling a ball up/down wall (Rt), YTB pull up (Lt) and working on trunk elongation Standing abdominal bracing + press down on orange PB --> standing with back against wall Modified 1/2 kneeling (opp shin supported on low table) + alt diagonal reaches with 1#MB --> 0# ball Therapeutic Activity: Side stepping Diagonal step together, reciprocal stepping Step up/down reciprocal stepping on 4 step + bilateral railing   PATIENT EDUCATION:  Education details: Updated HEP  Person educated: Patient Education method: Explanation, Demonstration, and Handouts Education comprehension: verbalized understanding, returned demonstration, and needs further education  HOME EXERCISE PROGRAM: Access Code: [email or web address] URL: [email or web address] Date: 09/30/2023 Prepared by: Lamarr Price  Program Notes Avoid sitting for more than 20-30 minutes at a time.Since you are not comfortable on your stomach, lay flat with legs out (prop a  folded pillow beside the R foot to prevent it turning out) for 15 minutes, 4 times/day to ensure the hip does not get tight.Do not bend past 90 degrees and AVOID straight leg raise.All exercises should be pain free.  Exercises - Standing Glute Med Mobilization with Small Ball on Wall  - 2 x daily - 5 x weekly - 1 sets - 10 reps - Bridge with Posterior Pelvic Tilt  - 1 x daily - 7 x weekly - 2 sets - 10 reps - 1-2 hold - Supine Hip Internal and External Rotation  - 1 x daily - 7 x weekly - 2 sets - 10 reps - Supine Piriformis Stretch with Foot on Ground  - 2 x daily - 7 x weekly - 1 sets - 3 reps - 30 hold - Standing Hip Flexor Stretch  - 2 x daily - 7 x weekly - 1 sets - 2 reps - 30 hold - Hooklying Single Knee to Chest Stretch  - 2 x daily - 7 x weekly - 1 sets - 3 reps - 30 hold - Lateral Lunge Adductor Stretch with Counter Support  - 2 x daily - 7 x weekly - 1 sets - 3 reps - 30 hold  ASSESSMENT:  CLINICAL IMPRESSION:  Updated HEP reviewed and clarification provided for proper body mechanics. Patient has met 3/6 LTGs in POC; slight regression in LEFS score, indicating decline in patient's ability to engage with ADLs with ease and less pain. Walking distance tolerance has improved, however patient continues to have exacerbation of lateral hip/thigh pain and right-sided SI discomfort.   EVAL: Patient is a 68 y.o. female who was seen today for physical therapy evaluation and treatment S/P R hip arthroscopic surgery. She presents with impairments in hip ROM, muscle strength, functional mobility, gait speed, and balance. She also has h/o scoliosis which leads to asymmetry. Patient presents today with excessive ROM into ER and PT reviewed limitations of ROM at this time. Patient will benefit from PT services to improve strength, progress ambulation, and return to ADLs and IADLs.  OBJECTIVE IMPAIRMENTS: Abnormal gait, decreased activity tolerance, decreased balance, decreased endurance, decreased ROM,  decreased strength, hypomobility, increased fascial restrictions, impaired flexibility, postural dysfunction, and pain.   GOALS: Goals reviewed with patient? Yes  SHORT TERM GOALS: Target date: 09/04/23  Patient will be independent and compliant with initial HEP.    Baseline: issued at eval  Goal status: MET   2.  Patient will demonstrate step through gait pattern without AD.  Baseline: step to with crutches Goal status: MET on 08/21/23   3.  Patient will demonstrate at least 90 degrees of active hip flexion ROM to improve ability to complete squatting activity.  Baseline: see PROM above 09/03/23: 90 degrees AROM Goal status: MET   4.  Patient will be able to ascend/descend stairs with reciprocal pattern. Baseline: step to with crutches  Goal status: MET   LONG TERM GOALS: Target date: 10/04/23     Patient will score >/= 40% on the LEFS (MCID is 9) to signify clinically meaningful improvement in functional abilities.    Baseline: 13.8%  09/03/23: 26/80 = 35.0% 09/30/23: 20/80 = 25/0% Goal status: REGRESSION   2.  Patient will demonstrate at least 4/5 R hip strength to improve stability with walking/standing activity.  Baseline: deferred  09/30/23: see above Goal status: MET   3.  Patient will demonstrate functional and pain free R hip ROM.  Baseline: see PROM above  09/30/23: no pain Goal status: MET   4.  Patient will be able to ambulate community distances without AD without hip pain.  Baseline: see gait above  09/30/23: 178' Goal status: IN PROGRESS   5.  Patient will be independent with advanced home program to progress/maintain current level of function.  Baseline: initial HEP issued  Goal status: MET   6.  Patient will maintain SLS on the RLE for at least 10 seconds to improve gait stability.  Baseline: unable  09/30/23: 8 sec Goal status: IN PROGRESS  PLAN:  PT FREQUENCY: 2x/week  PT DURATION: 8 weeks  PLANNED INTERVENTIONS: [id number]- PT Re-evaluation,  97110-Therapeutic exercises, 97530- Therapeutic activity, 97112- Neuromuscular re-education, 97535- Self Care, [id number]- Manual therapy, [phone]- Gait training, Patient/Family education, Balance training, Taping, Dry Needling, Cryotherapy, and Moist heat  PLAN FOR NEXT SESSION: Re-eval next visit. Progress via protocol. No ROM restrictions per surgeon.    Lamarr GORMAN Price, PTA 09/30/2023, 2:48 PM

## 2023-10-02 NOTE — Progress Notes (Unsigned)
 HPI: FU CAD.  I initially saw patient in March 2019 with chest pain.  We scheduled a cardiac catheterization but patient subsequently presented with pneumothorax. She ultimately underwent cardiac catheterization April 2019 and was found to have a 99% mid to distal right coronary artery, 75% proximal LAD followed by 60% lesion and normal LV function. The patient had PCI of both lesions with drug-eluting stents.  Echocardiogram May 2022 showed normal LV function, grade 1 diastolic dysfunction. Nuclear study March 2025 showed ejection fraction 77% and no ischemia or infarction.  Since last seen, the patient has dyspnea with more extreme activities but not with routine activities. It is relieved with rest. It is not associated with chest pain. There is no orthopnea, PND or pedal edema. There is no syncope or palpitations. There is no exertional chest pain.   Current Outpatient Medications  Medication Sig Dispense Refill   clobetasol  (TEMOVATE ) 0.05 % external solution Apply 1 Application topically as needed.     clonazePAM  (KLONOPIN ) 0.5 MG tablet Take 0.5 mg by mouth at bedtime.     gabapentin  (NEURONTIN ) 300 MG capsule TAKE 1 CAPSULE IN THE MORNING AND 2 CAPSULES AT BEDTIME (Patient taking differently: Take 600 mg by mouth at bedtime. TAKE  2 CAPSULES AT BEDTIME) 270 capsule 3   albuterol  (VENTOLIN  HFA) 108 (90 Base) MCG/ACT inhaler INHALE 1-2 PUFFS INTO THE LUNGS EVERY 4 HOURS AS NEEDED FOR WHEEZING OR SHORTNESS OF BREATH. 18 g 2   amoxicillin -clavulanate (AUGMENTIN ) 875-125 MG tablet Take 1 tablet by mouth every 12 (twelve) hours. 14 tablet 0   aspirin  EC 325 MG tablet Take 1 tablet (325 mg total) by mouth daily. 14 tablet 0   atorvastatin  (LIPITOR) 20 MG tablet Take 1 tablet (20 mg total) by mouth daily. 90 tablet 2   brexpiprazole (REXULTI) 1 MG TABS tablet Take 1 mg by mouth daily.     busPIRone  (BUSPAR ) 15 MG tablet Take 60 mg by mouth daily.     calcium -vitamin D  (OSCAL WITH D) 500-200  MG-UNIT tablet Take 1 tablet by mouth daily.     denosumab  (PROLIA ) 60 MG/ML SOLN injection Inject 60 mg into the skin every 6 (six) months. Administer in upper arm, thigh, or abdomen     desvenlafaxine  (PRISTIQ ) 100 MG 24 hr tablet Take 100 mg by mouth in the morning.     Empagliflozin-metFORMIN  HCl ER (SYNJARDY  XR) 25-1000 MG TB24 Take 1 tablet by mouth daily. 90 tablet 1   fluticasone  (FLONASE ) 50 MCG/ACT nasal spray SPRAY 2 SPRAYS INTO EACH NOSTRIL EVERY DAY 48 mL 1   ipratropium-albuterol  (DUONEB) 0.5-2.5 (3) MG/3ML SOLN Take 3 mLs by nebulization every 2 (two) hours as needed (wheeze, SOB). 60 mL 0   ketoconazole (NIZORAL) 2 % shampoo Apply topically.     levothyroxine  (SYNTHROID ) 137 MCG tablet TAKE 1 TABLET ONCE A WEEK THEN 1/2 TABLET ALL OTHER DAYS AS DIRECTED 52 tablet 2   Menthol, Topical Analgesic, (MINERAL ICE EX) Apply 1 application to affected sites one to two times a day as needed (for pain)     metoprolol  succinate (TOPROL -XL) 25 MG 24 hr tablet TAKE 1 TABLET (25 MG TOTAL) BY MOUTH DAILY. 90 tablet 2   nitroGLYCERIN  (NITROSTAT ) 0.4 MG SL tablet DISSOLVE 1 TAB UNDER THE TONGUE FOR CHEST PAIN EVERY 5 MINUTES UP TO 3 DOSES IF PAIN PERSIST CALL 911/SEEK MEDICAL HELP (Patient not taking: Reported on 10/08/2023) 75 tablet 2   Omega-3 Fatty Acids (FISH OIL PO) Take  1 capsule by mouth daily.      pantoprazole  (PROTONIX ) 40 MG tablet TAKE 1 TABLET EVERY DAY 90 tablet 3   trazodone  (DESYREL ) 300 MG tablet Take 300 mg by mouth at bedtime.     Current Facility-Administered Medications  Medication Dose Route Frequency Provider Last Rate Last Admin   [START ON 03/06/2024] denosumab  (PROLIA ) injection 60 mg  60 mg Subcutaneous Q6 months Breeback, Jade L, PA-C         Past Medical History:  Diagnosis Date   Allergy 1992   Anxiety    Arthritis    right little finger; base of thumb left hand; spine (07/07/2017)   Asthma, chronic 11/27/2015   Cataract 2021   Chronic back pain    Coronary  artery disease    4/19 PCI/DEx2 to LAD, and PCI/DES x1 to RCA, normal EF   Depression    Diabetes mellitus without complication (HCC) 2020   Diverticulitis of colon 12/21/2015   Colonoscopy every 5 years/digestive health.    GERD (gastroesophageal reflux disease) 11/27/2015   History of blood transfusion    related to spinal fusions   Hyperlipidemia LDL goal <70 11/29/2018   Hypertension    Hypothyroidism    Idiopathic scoliosis 01/22/2016   Osteoporosis 11/27/2015   On prolia .    Pneumonia 2016   Pneumothorax 06/26/2017   Pre-diabetes    Sleep apnea 2020   does not use CPAP   Thyroid  disease     Past Surgical History:  Procedure Laterality Date   ABDOMINAL HYSTERECTOMY     bladder mesh     S/P bladder sling   COLON SURGERY  2007   CORONARY PRESSURE/FFR STUDY N/A 07/07/2017   Procedure: INTRAVASCULAR PRESSURE WIRE/FFR STUDY;  Surgeon: Anner Alm ORN, MD;  Location: [facility] INVASIVE CV LAB;  Service: Cardiovascular;  Laterality: N/A;   CORONARY STENT INTERVENTION N/A 07/07/2017   Procedure: CORONARY STENT INTERVENTION;  Surgeon: Anner Alm ORN, MD;  Location: [facility] INVASIVE CV LAB;  Service: Cardiovascular;  Laterality: N/A;   ELBOW SURGERY Right    dislocation   HIP ARTHROSCOPY Right 07/30/2023   Procedure: ARTHROSCOPY HIP;  Surgeon: Genelle Standing, MD;  Location: [facility];  Service: Orthopedics;  Laterality: Right;  RIGHT HIP ARTHROSCOPY WITH PINCER DEBRIDEMENT   INCONTINENCE SURGERY     didn't work   JOINT REPLACEMENT     LEFT HEART CATH AND CORONARY ANGIOGRAPHY N/A 07/07/2017   Procedure: LEFT HEART CATH AND CORONARY ANGIOGRAPHY;  Surgeon: Anner Alm ORN, MD;  Location: [facility] INVASIVE CV LAB;  Service: Cardiovascular;  Laterality: N/A;   SMALL INTESTINE SURGERY     SPINAL FUSION  ~ 1970 X 3   below neck - lower back   TONSILLECTOMY     TOTAL HIP ARTHROPLASTY Left     Social History   Socioeconomic History   Marital status: Married    Spouse  name: Marinell   Number of children: 1   Years of education: 16   Highest education level: Bachelor's degree (e.g., BA, AB, BS)  Occupational History   Occupation: Retired  Tobacco Use   Smoking status: Never   Smokeless tobacco: Never  Vaping Use   Vaping status: Never Used  Substance and Sexual Activity   Alcohol use: Yes    Comment: Maybe 3 drinks/year   Drug use: No   Sexual activity: Not Currently    Birth control/protection: None, Surgical    Comment: hyst  Other Topics Concern   Not on file  Social History Narrative   Lives with her husband. She has one child. She enjoys playing games on the computer.   Social Drivers of Corporate investment banker Strain: Low Risk  (07/04/2023)   Received from [facility]   Overall Financial Resource Strain (CARDIA)    Difficulty of Paying Living Expenses: Not very hard  Food Insecurity: No Food Insecurity (07/04/2023)   Received from [facility]   Hunger Vital Sign    Within the past 12 months, you worried that your food would run out before you got the money to buy more.: Never true    Within the past 12 months, the food you bought just didn't last and you didn't have money to get more.: Never true  Transportation Needs: No Transportation Needs (07/04/2023)   Received from [facility] - Transportation    Lack of Transportation (Medical): No    Lack of Transportation (Non-Medical): No  Physical Activity: Unknown (07/04/2023)   [facility]   Exercise Vital Sign    On average, how many days per week do you engage in moderate to strenuous exercise (like a brisk walk)?: 0 days    Minutes of Exercise per Session: Not on file  Recent Concern: Physical Activity - Inactive (05/23/2023)   Exercise Vital Sign    Days of Exercise per Week: 0 days    Minutes of Exercise per Session: 0 min  Stress: No Stress Concern Present (07/04/2023)   Received from [facility] -  Occupational Stress Questionnaire    Feeling of Stress : Only a little  Social Connections: Somewhat Isolated (07/04/2023)   Received from [facility]   Social Network    How would you rate your social network (family, work, friends)?: Restricted participation with some degree of social isolation  Intimate Partner Violence: Not At Risk (07/04/2023)   Received from [facility]   HITS    Over the last 12 months how often did your partner physically hurt you?: Never    Over the last 12 months how often did your partner insult you or talk down to you?: Never    Over the last 12 months how often did your partner threaten you with physical harm?: Never    Over the last 12 months how often did your partner scream or curse at you?: Rarely    Family History  Problem Relation Age of Onset   Cancer Mother        lung   Depression Mother    Early death Mother    Heart attack Father    Hyperlipidemia Father    Hypertension Father    Arthritis Father    Heart disease Father    Diabetes Maternal Aunt    Hyperlipidemia Paternal Aunt    COPD Paternal Aunt    Hypertension Paternal Aunt    Stroke Paternal Aunt     ROS: no fevers or chills, productive cough, hemoptysis, dysphasia, odynophagia, melena, hematochezia, dysuria, hematuria, rash, seizure activity, orthopnea, PND, pedal edema, claudication. Remaining systems are negative.  Physical Exam: Well-developed well-nourished in no acute distress.  Skin is warm and dry.  HEENT is normal.  Neck is supple.  Chest is clear to auscultation with normal expansion.  Cardiovascular exam is regular rate and rhythm.  Abdominal exam nontender or distended. No masses palpated. Extremities show no edema. neuro grossly intact   A/P  1 coronary artery disease-patient denies chest pain.  Most  recent nuclear study showed no ischemia.  Continue aspirin  and statin.  2 hyperlipidemia-continue statin.  Will have her most recent lipids forwarded to us  from  her primary care physician.  Goal LDL less than 55.  3 history of dyspnea-this is felt secondary to restrictive lung disease secondary to severe scoliosis.  Redell Shallow, MD

## 2023-10-05 DIAGNOSIS — N3281 Overactive bladder: Secondary | ICD-10-CM | POA: Diagnosis not present

## 2023-10-05 DIAGNOSIS — R32 Unspecified urinary incontinence: Secondary | ICD-10-CM | POA: Diagnosis not present

## 2023-10-05 DIAGNOSIS — R339 Retention of urine, unspecified: Secondary | ICD-10-CM | POA: Diagnosis not present

## 2023-10-05 DIAGNOSIS — F333 Major depressive disorder, recurrent, severe with psychotic symptoms: Secondary | ICD-10-CM | POA: Diagnosis not present

## 2023-10-07 DIAGNOSIS — M545 Low back pain, unspecified: Secondary | ICD-10-CM | POA: Diagnosis not present

## 2023-10-07 DIAGNOSIS — M47816 Spondylosis without myelopathy or radiculopathy, lumbar region: Secondary | ICD-10-CM | POA: Diagnosis not present

## 2023-10-07 DIAGNOSIS — G8929 Other chronic pain: Secondary | ICD-10-CM | POA: Diagnosis not present

## 2023-10-07 DIAGNOSIS — M5126 Other intervertebral disc displacement, lumbar region: Secondary | ICD-10-CM | POA: Diagnosis not present

## 2023-10-08 ENCOUNTER — Encounter: Payer: Self-pay | Admitting: Cardiology

## 2023-10-08 ENCOUNTER — Ambulatory Visit: Attending: Cardiology | Admitting: Cardiology

## 2023-10-08 VITALS — BP 110/70 | HR 94 | Ht <= 58 in | Wt 118.6 lb

## 2023-10-08 DIAGNOSIS — R0609 Other forms of dyspnea: Secondary | ICD-10-CM | POA: Diagnosis not present

## 2023-10-08 DIAGNOSIS — E785 Hyperlipidemia, unspecified: Secondary | ICD-10-CM | POA: Diagnosis not present

## 2023-10-08 DIAGNOSIS — I25118 Atherosclerotic heart disease of native coronary artery with other forms of angina pectoris: Secondary | ICD-10-CM | POA: Diagnosis not present

## 2023-10-08 NOTE — Patient Instructions (Signed)
   Follow-Up: At [facility], you and your health needs are our priority.  As part of our continuing mission to provide you with exceptional heart care, our providers are all part of one team.  This team includes your primary Cardiologist (physician) and Advanced Practice Providers or APPs (Physician Assistants and Nurse Practitioners) who all work together to provide you with the care you need, when you need it.  Your next appointment:   12 month(s)  Provider:   Alexandria Angel, MD

## 2023-10-15 ENCOUNTER — Ambulatory Visit

## 2023-10-15 ENCOUNTER — Encounter: Payer: Self-pay | Admitting: Cardiology

## 2023-10-15 VITALS — Ht <= 58 in | Wt 120.0 lb

## 2023-10-15 DIAGNOSIS — Z0000 Encounter for general adult medical examination without abnormal findings: Secondary | ICD-10-CM | POA: Diagnosis not present

## 2023-10-15 DIAGNOSIS — E785 Hyperlipidemia, unspecified: Secondary | ICD-10-CM

## 2023-10-15 DIAGNOSIS — I25118 Atherosclerotic heart disease of native coronary artery with other forms of angina pectoris: Secondary | ICD-10-CM

## 2023-10-15 MED ORDER — METOPROLOL SUCCINATE ER 25 MG PO TB24: ORAL_TABLET | ORAL | 3 refills | Status: AC

## 2023-10-15 MED ORDER — ATORVASTATIN CALCIUM 20 MG PO TABS: 20.0000 mg | ORAL_TABLET | Freq: Every day | ORAL | 3 refills | Status: AC

## 2023-10-15 NOTE — Progress Notes (Signed)
 Subjective:   Leslie Roberson is a 68 y.o. female who presents for Medicare Annual (Subsequent) preventive examination.  Visit Complete: Virtual I connected with  Leslie Roberson on 10/15/23 by a audio enabled telemedicine application and verified that I am speaking with the correct person using two identifiers.  Patient Location: Home  Provider Location: Office/Clinic  I discussed the limitations of evaluation and management by telemedicine. The patient expressed understanding and agreed to proceed.  Vital Signs: Because this visit was a virtual/telehealth visit, some criteria may be missing or patient reported. Any vitals not documented were not able to be obtained and vitals that have been documented are patient reported.  Patient Medicare AWV questionnaire was completed by the patient on 10/11/2023; I have confirmed that all information answered by patient is correct and no changes since this date.  Cardiac Risk Factors include: diabetes mellitus;advanced age (>47men, >17 women);hypertension;family history of premature cardiovascular disease     Objective:    Today's Vitals   10/15/23 1338  Weight: 120 lb (54.4 kg)  Height: 4' 10 (1.473 m)  PainSc: 6    Body mass index is 25.08 kg/m.     10/15/2023    1:50 PM 08/05/2023   12:01 PM 07/30/2023   12:38 PM 07/24/2023    3:47 PM 10/06/2022    1:23 PM 11/27/2021    3:28 PM 09/30/2021    2:02 PM  Advanced Directives  Does Patient Have a Medical Advance Directive? Yes Yes Yes Yes Yes No Yes  Type of Advance Directive Living will Healthcare Power of [employer] Power of [employer] Power of Attorney Living will  Living will  Does patient want to make changes to medical advance directive? No - Patient declined  No - Patient declined  No - Patient declined  No - Patient declined  Copy of Healthcare Power of Attorney in Chart?   No - copy requested      Would patient like information on creating a medical advance  directive?      No - Patient declined     Current Medications (verified) Outpatient Encounter Medications as of 10/15/2023  Medication Sig   albuterol  (VENTOLIN  HFA) 108 (90 Base) MCG/ACT inhaler INHALE 1-2 PUFFS INTO THE LUNGS EVERY 4 HOURS AS NEEDED FOR WHEEZING OR SHORTNESS OF BREATH.   aspirin  EC 81 MG tablet Take 81 mg by mouth daily. Swallow whole.   atorvastatin  (LIPITOR) 20 MG tablet Take 1 tablet (20 mg total) by mouth daily.   brexpiprazole (REXULTI) 1 MG TABS tablet Take 1 mg by mouth daily.   busPIRone  (BUSPAR ) 15 MG tablet Take 60 mg by mouth daily.   calcium -vitamin D  (OSCAL WITH D) 500-200 MG-UNIT tablet Take 1 tablet by mouth daily.   clobetasol  (TEMOVATE ) 0.05 % external solution Apply 1 Application topically as needed.   clonazePAM  (KLONOPIN ) 0.5 MG tablet Take 0.5 mg by mouth at bedtime.   denosumab  (PROLIA ) 60 MG/ML SOLN injection Inject 60 mg into the skin every 6 (six) months. Administer in upper arm, thigh, or abdomen   desvenlafaxine  (PRISTIQ ) 100 MG 24 hr tablet Take 100 mg by mouth in the morning.   Empagliflozin-metFORMIN  HCl ER (SYNJARDY  XR) 25-1000 MG TB24 Take 1 tablet by mouth daily.   fluticasone  (FLONASE ) 50 MCG/ACT nasal spray SPRAY 2 SPRAYS INTO EACH NOSTRIL EVERY DAY   gabapentin  (NEURONTIN ) 300 MG capsule TAKE 1 CAPSULE IN THE MORNING AND 2 CAPSULES AT BEDTIME (Patient taking differently: Take 600 mg by mouth  at bedtime. TAKE  2 CAPSULES AT BEDTIME)   ketoconazole (NIZORAL) 2 % shampoo Apply topically.   levothyroxine  (SYNTHROID ) 137 MCG tablet TAKE 1 TABLET ONCE A WEEK THEN 1/2 TABLET ALL OTHER DAYS AS DIRECTED   metoprolol  succinate (TOPROL -XL) 25 MG 24 hr tablet TAKE 1 TABLET (25 MG TOTAL) BY MOUTH DAILY.   nitroGLYCERIN  (NITROSTAT ) 0.4 MG SL tablet DISSOLVE 1 TAB UNDER THE TONGUE FOR CHEST PAIN EVERY 5 MINUTES UP TO 3 DOSES IF PAIN PERSIST CALL 911/SEEK MEDICAL HELP   Omega-3 Fatty Acids (FISH OIL PO) Take 1 capsule by mouth daily.    pantoprazole   (PROTONIX ) 40 MG tablet TAKE 1 TABLET EVERY DAY   trazodone  (DESYREL ) 300 MG tablet Take 300 mg by mouth at bedtime.   Menthol, Topical Analgesic, (MINERAL ICE EX) Apply 1 application to affected sites one to two times a day as needed (for pain) (Patient not taking: Reported on 10/15/2023)   [DISCONTINUED] amoxicillin -clavulanate (AUGMENTIN ) 875-125 MG tablet Take 1 tablet by mouth every 12 (twelve) hours.   [DISCONTINUED] aspirin  EC 325 MG tablet Take 1 tablet (325 mg total) by mouth daily.   [DISCONTINUED] ipratropium-albuterol  (DUONEB) 0.5-2.5 (3) MG/3ML SOLN Take 3 mLs by nebulization every 2 (two) hours as needed (wheeze, SOB).   Facility-Administered Encounter Medications as of 10/15/2023  Medication   [START ON 03/06/2024] denosumab  (PROLIA ) injection 60 mg    Allergies (verified) Hydromorphone, Levofloxacin, Meperidine, Zaleplon, Broccoli [brassica oleracea], and Adhesive [tape]   History: Past Medical History:  Diagnosis Date   Allergy 1992   Anxiety    Arthritis    right little finger; base of thumb left hand; spine (07/07/2017)   Asthma, chronic 11/27/2015   Cataract 2021   Chronic back pain    Coronary artery disease    4/19 PCI/DEx2 to LAD, and PCI/DES x1 to RCA, normal EF   Depression    Diabetes mellitus without complication (HCC) 2020   Diverticulitis of colon 12/21/2015   Colonoscopy every 5 years/digestive health.    GERD (gastroesophageal reflux disease) 11/27/2015   History of blood transfusion    related to spinal fusions   Hyperlipidemia LDL goal <70 11/29/2018   Hypertension    Hypothyroidism    Idiopathic scoliosis 01/22/2016   Osteoporosis 11/27/2015   On prolia .    Pneumonia 2016   Pneumothorax 06/26/2017   Pre-diabetes    Sleep apnea 2020   does not use CPAP   Thyroid  disease    Past Surgical History:  Procedure Laterality Date   ABDOMINAL HYSTERECTOMY     bladder mesh     S/P bladder sling   COLON SURGERY  2007   CORONARY PRESSURE/FFR  STUDY N/A 07/07/2017   Procedure: INTRAVASCULAR PRESSURE WIRE/FFR STUDY;  Surgeon: Anner Alm ORN, MD;  Location: [facility] INVASIVE CV LAB;  Service: Cardiovascular;  Laterality: N/A;   CORONARY STENT INTERVENTION N/A 07/07/2017   Procedure: CORONARY STENT INTERVENTION;  Surgeon: Anner Alm ORN, MD;  Location: [facility] INVASIVE CV LAB;  Service: Cardiovascular;  Laterality: N/A;   ELBOW SURGERY Right    dislocation   HIP ARTHROSCOPY Right 07/30/2023   Procedure: ARTHROSCOPY HIP;  Surgeon: Genelle Standing, MD;  Location: [facility];  Service: Orthopedics;  Laterality: Right;  RIGHT HIP ARTHROSCOPY WITH PINCER DEBRIDEMENT   INCONTINENCE SURGERY     didn't work   JOINT REPLACEMENT     LEFT HEART CATH AND CORONARY ANGIOGRAPHY N/A 07/07/2017   Procedure: LEFT HEART CATH AND CORONARY ANGIOGRAPHY;  Surgeon: Anner Alm  W, MD;  Location: [facility] INVASIVE CV LAB;  Service: Cardiovascular;  Laterality: N/A;   SMALL INTESTINE SURGERY     SPINAL FUSION  ~ 1970 X 3   below neck - lower back   TONSILLECTOMY     TOTAL HIP ARTHROPLASTY Left    Family History  Problem Relation Age of Onset   Cancer Mother        lung   Depression Mother    Early death Mother    Heart attack Father    Hyperlipidemia Father    Hypertension Father    Arthritis Father    Heart disease Father    Diabetes Maternal Aunt    Hyperlipidemia Paternal Aunt    COPD Paternal Aunt    Hypertension Paternal Aunt    Stroke Paternal Aunt    Social History   Socioeconomic History   Marital status: Married    Spouse name: Marinell   Number of children: 1   Years of education: 16   Highest education level: Bachelor's degree (e.g., BA, AB, BS)  Occupational History   Occupation: Retired  Tobacco Use   Smoking status: Never   Smokeless tobacco: Never  Vaping Use   Vaping status: Never Used  Substance and Sexual Activity   Alcohol use: Yes    Comment: Maybe 3 drinks/year   Drug use: No   Sexual activity: Not  Currently    Birth control/protection: None, Surgical    Comment: hyst  Other Topics Concern   Not on file  Social History Narrative   Lives with her husband. She has one child. She enjoys playing games on the computer.   Social Drivers of Corporate investment banker Strain: Low Risk  (10/15/2023)   Overall Financial Resource Strain (CARDIA)    Difficulty of Paying Living Expenses: Not hard at all  Food Insecurity: No Food Insecurity (10/15/2023)   Hunger Vital Sign    Worried About Running Out of Food in the Last Year: Never true    Ran Out of Food in the Last Year: Never true  Transportation Needs: No Transportation Needs (10/15/2023)   PRAPARE - Administrator, Civil Service (Medical): No    Lack of Transportation (Non-Medical): No  Physical Activity: Inactive (10/15/2023)   Exercise Vital Sign    Days of Exercise per Week: 0 days    Minutes of Exercise per Session: 0 min  Stress: No Stress Concern Present (10/15/2023)   [employer] of Occupational Health - Occupational Stress Questionnaire    Feeling of Stress: Not at all  Social Connections: Moderately Integrated (10/15/2023)   Social Connection and Isolation Panel    Frequency of Communication with Friends and Family: Twice a week    Frequency of Social Gatherings with Friends and Family: Once a week    Attends Religious Services: More than 4 times per year    Active Member of [employer] or Organizations: No    Attends Engineer, structural: Never    Marital Status: Married    Tobacco Counseling Counseling given: Not Answered   Clinical Intake:  Pre-visit preparation completed: Yes  Pain : 0-10 Pain Score: 6  Pain Type: Chronic pain Pain Location: Back Pain Orientation: Lower Pain Onset: More than a month ago Pain Frequency: Intermittent     BMI - recorded: 25.08 Nutritional Status: BMI 25 -29 Overweight Nutritional Risks: None Diabetes: Yes CBG done?: No  How often do you need to have  someone help you when you read  instructions, pamphlets, or other written materials from your doctor or pharmacy?: 1 - Never What is the last grade level you completed in school?: 16  Interpreter Needed?: No      Activities of Daily Living    10/15/2023    1:41 PM 10/11/2023   10:56 AM  In your present state of health, do you have any difficulty performing the following activities:  Hearing? 0 0  Vision? 0 0  Difficulty concentrating or making decisions? 0 0  Walking or climbing stairs? 1 1  Dressing or bathing? 0 0  Doing errands, shopping? 0 0  Preparing Food and eating ? N N  Using the Toilet?  N  In the past six months, have you accidently leaked urine? Y Y  Do you have problems with loss of bowel control? N N  Managing your Medications? N N  Managing your Finances? N N  Housekeeping or managing your Housekeeping? CINDERELLA CINDERELLA    Patient Care Team: Breeback, Jade L, PA-C as PCP - General (Family Medicine) Pietro Redell RAMAN, MD as PCP - Cardiology (Cardiology) Gust Royden ORN, MD as Consulting Physician (Orthopedic Surgery) Genelle Standing, MD as Consulting Physician (Orthopedic Surgery) Claudene Norleen MOULD, MD as Referring Physician (Urology) Claudene Loving, MD as Referring Physician (Dermatology)  Indicate any recent Medical Services you may have received from other than [facility] providers in the past year (date may be approximate).     Assessment:   This is a routine wellness examination for [facility].  Hearing/Vision screen No results found.   Goals Addressed             This Visit's Progress    Patient Stated       Patient states she would like to have less back pain.        Depression Screen    10/15/2023    1:49 PM 10/06/2022    1:25 PM 09/08/2022    4:45 PM 04/23/2022    3:03 PM 10/14/2021    3:17 PM 09/30/2021    2:03 PM 05/14/2021    2:08 PM  PHQ 2/9 Scores  PHQ - 2 Score 0 0 2 0 0 0 2  PHQ- 9 Score   5    3    Fall Risk    10/15/2023    1:51 PM 10/11/2023   10:56 AM  03/04/2023    2:19 PM 10/06/2022    1:24 PM 09/29/2022   11:54 AM  Fall Risk   Falls in the past year? 0 0 0 0 0  Number falls in past yr: 0 0 0 0 0  Injury with Fall? 0 0 0 0 0  Risk for fall due to : No Fall Risks   No Fall Risks   Follow up Falls evaluation completed   Falls evaluation completed     MEDICARE RISK AT HOME: Medicare Risk at Home Any stairs in or around the home?: No If so, are there any without handrails?: No Home free of loose throw rugs in walkways, pet beds, electrical cords, etc?: Yes Adequate lighting in your home to reduce risk of falls?: Yes Life alert?: No Use of a cane, walker or w/c?: Yes Grab bars in the bathroom?: Yes Shower chair or bench in shower?: Yes Elevated toilet seat or a handicapped toilet?: No  TIMED UP AND GO:  Was the test performed?  No    Cognitive Function:        10/15/2023    1:51 PM 10/06/2022  1:28 PM 09/30/2021    2:08 PM  6CIT Screen  What Year? 0 points 0 points 0 points  What month? 0 points 0 points 0 points  What time? 0 points 0 points 0 points  Count back from 20 0 points 0 points 0 points  Months in reverse 0 points 0 points 0 points  Repeat phrase 0 points 0 points 0 points  Total Score 0 points 0 points 0 points    Immunizations Immunization History  Administered Date(s) Administered   Fluad Quad(high Dose 65+) 12/20/2021   Influenza Inj Mdck Quad Pf 11/22/2017   Influenza, High Dose Seasonal PF 11/26/2022   Influenza,inj,Quad PF,6+ Mos 11/27/2015, 12/15/2016, 12/09/2018, 12/16/2019   Influenza-Unspecified 12/28/2020   Moderna Covid-19 Fall Seasonal Vaccine 75yrs & older 01/07/2022   Moderna Sars-Covid-2 Vaccination 02/15/2020, 09/22/2020   PFIZER Comirnaty(Gray Top)Covid-19 Tri-Sucrose Vaccine 01/24/2022   PFIZER(Purple Top)SARS-COV-2 Vaccination 06/16/2019, 07/11/2019   PNEUMOCOCCAL CONJUGATE-20 11/14/2020   PPD Test 09/22/2012   Pfizer Covid-19 Vaccine Bivalent Booster 46yrs & up 12/28/2020, 02/11/2021    Pfizer(Comirnaty)Fall Seasonal Vaccine 12 years and older 01/24/2022, 11/26/2022, 06/14/2023, 09/02/2023   Pneumococcal Conjugate Pcv21, Polysaccharide Crm197 Conjugaf 06/14/2023   Pneumococcal Polysaccharide-23 07/21/2009   Pneumococcal-Unspecified 07/21/2009   Respiratory Syncytial Virus Vaccine,Recomb Aduvanted(Arexvy) 01/24/2022   Tdap 01/30/2016, 06/18/2023   Zoster Recombinant(Shingrix) 07/29/2016, 11/16/2016   Zoster, Live 02/04/2016    TDAP status: Up to date  Flu Vaccine status: Up to date  Pneumococcal vaccine status: Up to date  Covid-19 vaccine status: Completed vaccines  Qualifies for Shingles Vaccine? Yes   Zostavax completed Yes   Shingrix Completed?: Yes  Screening Tests Health Maintenance  Topic Date Due   COVID-19 Vaccine (10 - 2024-25 season) 10/28/2023   INFLUENZA VACCINE  10/30/2023   Diabetic kidney evaluation - Urine ACR  03/03/2024   FOOT EXAM  03/03/2024   HEMOGLOBIN A1C  03/03/2024   OPHTHALMOLOGY EXAM  05/04/2024   Diabetic kidney evaluation - eGFR measurement  09/08/2024   Medicare Annual Wellness (AWV)  10/14/2024   DEXA SCAN  11/11/2024   MAMMOGRAM  04/28/2025   Colonoscopy  12/29/2025   DTaP/Tdap/Td (3 - Td or Tdap) 06/17/2033   Pneumococcal Vaccine: 50+ Years  Completed   Hepatitis C Screening  Completed   Zoster Vaccines- Shingrix  Completed   Hepatitis B Vaccines  Aged Out   HPV VACCINES  Aged Out   Meningococcal B Vaccine  Aged Out    Health Maintenance  There are no preventive care reminders to display for this patient.   Colorectal cancer screening: Type of screening: Colonoscopy. Completed 12/29/2020. Repeat every 5 years  Mammogram status: Completed 04/29/2023. Repeat every year  Bone Density status: Completed 11/12/2022. Results reflect: Bone density results: OSTEOPOROSIS. Repeat every 2 years.  Lung Cancer Screening: (Low Dose CT Chest recommended if Age 77-80 years, 20 pack-year currently smoking OR have quit w/in  15years.) does not qualify.   Lung Cancer Screening Referral: n/a  Additional Screening:  Hepatitis C Screening: does qualify; Completed 12/28/2015  Vision Screening: Recommended annual ophthalmology exams for early detection of glaucoma and other disorders of the eye. Is the patient up to date with their annual eye exam?  Yes  Who is the provider or what is the name of the office in which the patient attends annual eye exams? [facility] If pt is not established with a provider, would they like to be referred to a provider to establish care? N/a.   Dental Screening: Recommended annual dental  exams for proper oral hygiene  Diabetic Foot Exam: Diabetic Foot Exam: Completed 03/04/2023  Community Resource Referral / Chronic Care Management: CRR required this visit?  No   CCM required this visit?  No     Plan:     I have personally reviewed and noted the following in the patient's chart:   Medical and social history Use of alcohol, tobacco or illicit drugs  Current medications and supplements including opioid prescriptions. Patient is not currently taking opioid prescriptions. Functional ability and status Nutritional status Physical activity Advanced directives List of other physicians Hospitalizations # 0, surgeries # 1, and ER # 0 visits in previous 12 months Vitals Screenings to include cognitive, depression, and falls Referrals and appointments  In addition, I have reviewed and discussed with patient certain preventive protocols, quality metrics, and best practice recommendations. A written personalized care plan for preventive services as well as general preventive health recommendations were provided to patient.     Bonny Jon Mayor, CMA   10/15/2023   After Visit Summary: (MyChart) Due to this being a telephonic visit, the after visit summary with patients personalized plan was offered to patient via MyChart   Nurse Notes:   Gene Colee Minehart is a 68 y.o. female  patient of Antoniette Vermell CROME, PA-C who had a [employer] Visit today via telephone. Jazman is Retired and lives with their spouse. She has 1 child. she reports that she is socially active and does interact with friends/family regularly. She is minimally physically active and enjoys playing games on the computer.

## 2023-10-15 NOTE — Patient Instructions (Signed)
 Ms. Harbach , Thank you for taking time to come for your Medicare Wellness Visit. I appreciate your ongoing commitment to your health goals. Please review the following plan we discussed and let me know if I can assist you in the future.   These are the goals we discussed:  Goals       Patient Stated (pt-stated)      09/30/2021 AWV Goal: Diabetes Management  Patient will maintain an A1C level below 6.5 Patient will not develop any diabetic foot complications Patient will not experience any hypoglycemic episodes over the next 3 months Patient will notify our office of any CBG readings outside of the provider recommended range by calling [phone] Patient will adhere to provider recommendations for diabetes management  Patient Self Management Activities take all medications as prescribed and report any negative side effects monitor and record blood sugar readings as directed adhere to a low carbohydrate diet that incorporates lean proteins, vegetables, whole grains, low glycemic fruits check feet daily noting any sores, cracks, injuries, or callous formations see PCP or podiatrist if she notices any changes in her legs, feet, or toenails Patient will visit PCP and have an A1C level checked every 3 to 6 months as directed  have a yearly eye exam to monitor for vascular changes associated with diabetes and will request that the report be sent to her pcp.  consult with her PCP regarding any changes in her health or new or worsening symptoms       Patient Stated (pt-stated)      10/06/2022 AWV Goal: Diabetes Management  Patient will maintain an A1C level below 8.0 Patient will not develop any diabetic foot complications Patient will not experience any hypoglycemic episodes over the next 3 months Patient will notify our office of any CBG readings outside of the provider recommended range by calling [phone] Patient will adhere to provider recommendations for diabetes  management  Patient Self Management Activities take all medications as prescribed and report any negative side effects monitor and record blood sugar readings as directed adhere to a low carbohydrate diet that incorporates lean proteins, vegetables, whole grains, low glycemic fruits check feet daily noting any sores, cracks, injuries, or callous formations see PCP or podiatrist if she notices any changes in her legs, feet, or toenails Patient will visit PCP and have an A1C level checked every 3 to 6 months as directed  have a yearly eye exam to monitor for vascular changes associated with diabetes and will request that the report be sent to her pcp.  consult with her PCP regarding any changes in her health or new or worsening symptoms       Patient Stated      Patient states she would like to have less back pain.         This is a list of the screening recommended for you and due dates:  Health Maintenance  Topic Date Due   COVID-19 Vaccine (10 - 2024-25 season) 10/28/2023   Flu Shot  10/30/2023   Yearly kidney health urinalysis for diabetes  03/03/2024   Complete foot exam   03/03/2024   Hemoglobin A1C  03/03/2024   Eye exam for diabetics  05/04/2024   Yearly kidney function blood test for diabetes  09/08/2024   Medicare Annual Wellness Visit  10/14/2024   DEXA scan (bone density measurement)  11/11/2024   Mammogram  04/28/2025   Colon Cancer Screening  12/29/2025   DTaP/Tdap/Td vaccine (3 - Td or Tdap) 06/17/2033  Pneumococcal Vaccine for age over 55  Completed   Hepatitis C Screening  Completed   Zoster (Shingles) Vaccine  Completed   Hepatitis B Vaccine  Aged Out   HPV Vaccine  Aged Out   Meningitis B Vaccine  Aged Out

## 2023-10-19 ENCOUNTER — Ambulatory Visit

## 2023-10-19 ENCOUNTER — Encounter (HOSPITAL_BASED_OUTPATIENT_CLINIC_OR_DEPARTMENT_OTHER): Payer: Self-pay | Admitting: Orthopaedic Surgery

## 2023-10-19 DIAGNOSIS — M6281 Muscle weakness (generalized): Secondary | ICD-10-CM

## 2023-10-19 DIAGNOSIS — M25551 Pain in right hip: Secondary | ICD-10-CM

## 2023-10-19 DIAGNOSIS — R2689 Other abnormalities of gait and mobility: Secondary | ICD-10-CM

## 2023-10-19 NOTE — Therapy (Signed)
 OUTPATIENT PHYSICAL THERAPY LOWER EXTREMITY TREATMENT RE-CERTIFICATION  PHYSICAL THERAPY DISCHARGE SUMMARY  Visits from Start of Care: 14  Current functional level related to goals / functional outcomes: See goals below   Remaining deficits: See impression below   Education / Equipment: See education below    Patient agrees to discharge. Patient goals were partially met. Patient is being discharged due to maximized rehab potential.     Patient Name: Leslie Roberson MRN: [id number] DOB:1955/11/06, 68 y.o., female Today's Date: 10/19/2023  END OF SESSION:  PT End of Session - 10/19/23 1402     Visit Number 14    Number of Visits 16    Date for PT Re-Evaluation 10/04/23    Authorization Type [employer]    PT Start Time 1402    PT Stop Time 1443    PT Time Calculation (min) 41 min    Activity Tolerance Patient tolerated treatment well    Behavior During Therapy [facility] for tasks assessed/performed          Past Medical History:  Diagnosis Date   Allergy 1992   Anxiety    Arthritis    right little finger; base of thumb left hand; spine (07/07/2017)   Asthma, chronic 11/27/2015   Cataract 2021   Chronic back pain    Coronary artery disease    4/19 PCI/DEx2 to LAD, and PCI/DES x1 to RCA, normal EF   Depression    Diabetes mellitus without complication (HCC) 2020   Diverticulitis of colon 12/21/2015   Colonoscopy every 5 years/digestive health.    GERD (gastroesophageal reflux disease) 11/27/2015   History of blood transfusion    related to spinal fusions   Hyperlipidemia LDL goal <70 11/29/2018   Hypertension    Hypothyroidism    Idiopathic scoliosis 01/22/2016   Osteoporosis 11/27/2015   On prolia .    Pneumonia 2016   Pneumothorax 06/26/2017   Pre-diabetes    Sleep apnea 2020   does not use CPAP   Thyroid  disease    Past Surgical History:  Procedure Laterality Date   ABDOMINAL HYSTERECTOMY     bladder mesh     S/P bladder sling   COLON  SURGERY  2007   CORONARY PRESSURE/FFR STUDY N/A 07/07/2017   Procedure: INTRAVASCULAR PRESSURE WIRE/FFR STUDY;  Surgeon: Anner Alm ORN, MD;  Location: [facility] INVASIVE CV LAB;  Service: Cardiovascular;  Laterality: N/A;   CORONARY STENT INTERVENTION N/A 07/07/2017   Procedure: CORONARY STENT INTERVENTION;  Surgeon: Anner Alm ORN, MD;  Location: [facility] INVASIVE CV LAB;  Service: Cardiovascular;  Laterality: N/A;   ELBOW SURGERY Right    dislocation   HIP ARTHROSCOPY Right 07/30/2023   Procedure: ARTHROSCOPY HIP;  Surgeon: Genelle Standing, MD;  Location: [facility];  Service: Orthopedics;  Laterality: Right;  RIGHT HIP ARTHROSCOPY WITH PINCER DEBRIDEMENT   INCONTINENCE SURGERY     didn't work   JOINT REPLACEMENT     LEFT HEART CATH AND CORONARY ANGIOGRAPHY N/A 07/07/2017   Procedure: LEFT HEART CATH AND CORONARY ANGIOGRAPHY;  Surgeon: Anner Alm ORN, MD;  Location: [facility] INVASIVE CV LAB;  Service: Cardiovascular;  Laterality: N/A;   SMALL INTESTINE SURGERY     SPINAL FUSION  ~ 1970 X 3   below neck - lower back   TONSILLECTOMY     TOTAL HIP ARTHROPLASTY Left    Patient Active Problem List   Diagnosis Date Noted   Right hip impingement syndrome 07/30/2023   Toe blister without infection, left, initial encounter  06/02/2023   Routine health maintenance 09/08/2022   Need for antibiotic prophylaxis for dental procedure 08/05/2022   Sinobronchitis 07/29/2022   Community acquired pneumonia of left lower lobe of lung 12/11/2021   Closed dislocation of metacarpophalangeal joint of right thumb 11/18/2021   Mass of right axilla 05/15/2021   Ear fullness, left 04/08/2021   Bilateral hearing loss 04/05/2021   Controlled type 2 diabetes mellitus with complication, without long-term current use of insulin (HCC) 11/16/2020   Paroxysmal nocturnal dyspnea 05/14/2020   Orthopnea 05/14/2020   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   Status post hip replacement, left 07/19/2019    Left hip pain 07/19/2019   OSA on CPAP 05/20/2019   Snoring 05/02/2019   Non-restorative sleep 05/02/2019   Migraine without aura and without status migrainosus, not intractable 02/08/2019   OAB (overactive bladder) 02/08/2019   Diabetes mellitus without complication (HCC) 02/07/2019   Hyperlipidemia LDL goal <70 11/29/2018   DJD (degenerative joint disease), lumbosacral 11/29/2018   Pre-operative clearance 11/29/2018   Arthralgia 02/05/2018   Excessive sweating 09/22/2017   Tongue mass 08/14/2017   Angina, class III (HCC) 07/07/2017   CAD S/P percutaneous coronary angioplasty    Pneumothorax 06/25/2017   Itchy scalp 05/24/2017   Right leg pain 02/06/2017   Chronic tension-type headache, intractable 02/05/2017   Prediabetes 02/05/2017   Dysphagia 01/27/2017   No energy 09/15/2016   Paresthesia 04/03/2016   Facet hypertrophy of lumbosacral region 04/03/2016   Chronic back pain 04/03/2016   Right knee pain 02/19/2016   Rosacea 02/04/2016   Vaginal atrophy 01/31/2016   Idiopathic scoliosis 01/22/2016   Hip bursitis 12/25/2015   Biceps tendonitis on right 12/25/2015   Diverticulitis of colon 12/21/2015   GERD (gastroesophageal reflux disease) 11/27/2015   Osteoporosis 11/27/2015   GAD (generalized anxiety disorder) 11/27/2015   MDD (major depressive disorder), recurrent, in full remission (HCC) 11/27/2015   Chronic dryness of both eyes 11/27/2015   Acquired hypothyroidism 11/27/2015   Asthma, chronic 11/27/2015   Insomnia 11/27/2015   Seborrheic keratoses 11/27/2015   CAD in native artery 11/27/2015    PCP: Vermell Bologna, PA REFERRING PROVIDER: Elspeth Parker, MD REFERRING DIAG:  Diagnosis  M25.851 (ICD-10-CM) - Right hip impingement syndrome   THERAPY DIAG:  Muscle weakness (generalized)  Pain in right hip  Other abnormalities of gait and mobility  Rationale for Evaluation and Treatment: Rehabilitation  ONSET DATE: Date of Surgery: 07/30/2023  Days since  surgery: 81     SUBJECTIVE:  SUBJECTIVE STATEMENT: Patient reports the side of the Rt hip will hurt to sleep on at night and if she sleeps on her back her SI joint will hurt. She saw spine specialist and was recommended that a nerve ablation might be needed. She has practice injection on 7/29 before they do the true ablation. She has been inconsistent with HEP. Still has not heard from [facility] scoliosis. Still is limited in walking, standing, sitting due to SIJ pain.  EVAL: The patient has a multi-year h/o R hip pain-- and underwent R hip arthroscopy with pincer debridement and labral repair. She presents s/p surgery with a standard walker (using the one she had at home) so she is carrying the device. She does not have a brace for night (verified with Evaristo Dawson from Dr. Danetta office  to ensure this is correct). Patient has pain that is worse when laying down.   UPCOMING PROCEDURE: patient is having botox in her bladder and demonstrates she will need to be  able to lift her R leg minimally during procedure.   PERTINENT HISTORY: Diabetes, HTN, hearing loss, CAD, h/o SI surgery in 2023 (pin) PAIN:  Are you having pain? Yes: NPRS scale: 5/10 Pain location: Rt SIJ  Pain description: sharp Aggravating factors: laying down, prolonged walking, prolonged sitting Relieving factors: unsure  PRECAUTIONS: Other: hip arthroscopic procedure   *on 5/23, able to progress to post 3 weeks and begin further progression into protocol*  WEIGHT BEARING RESTRICTIONS: Yes WBAT  FALLS:  Has patient fallen in last 6 months? No  LIVING ENVIRONMENT: Lives with: lives with their spouse Lives in: House/apartment Stairs: one step entry- level indoors Has following equipment at home: standard walker-- carries the walker as she moves throughut clinic  PATIENT GOALS: reduce hip pain, return to daily activities  NEXT MD VISIT: TBD  OBJECTIVE:  Note: Objective measures were completed at Evaluation unless  otherwise noted. PATIENT SURVEYS:  LEFS 13.8%  6/23 LEFS 27/80  POSTURE: h/o scoliosis with R rib hump in thoracic spine, C curve concave to the L, and hip asymmetry-- L hip elevated as compared to the R hip   LOWER EXTREMITY ROM: *when patient moves into supine, she goes into end range ER and has >60 deg bilat. PT educated her that we want to prop with a pillow at night to prevent excessive hip ERr Passive ROM Right eval Left eval Right 09/07/23 R 6/23 PROM 10/19/23 Right   Hip flexion 90  105 AROM 110 p! 112 SIJ pain  Hip extension 0   0   Hip abduction 30    40  Hip adduction    30   Hip internal rotation   20 deg PROM 15 17  Hip external rotation *excessive Goes into 60+ deg in supine-- PT educated to limit to 20 degrees using pillow prop    35 34  Knee flexion       Knee extension       Ankle dorsiflexion       Ankle plantarflexion       Ankle inversion       Ankle eversion        (Blank rows = not tested)  LOWER EXTREMITY MMT: Deferred due to post surgical status MMT Right eval R 6/23 Left eval Right 09/30/23 10/19/23 Right   Hip flexion  4/5 p!  4+/5 5 SIJ pain  Hip extension  4-/5  4/5 4+  Hip abduction  4/5 p!  4+/5 4+  Hip adduction       Hip internal rotation     5  Hip external rotation     5  Knee flexion       Knee extension       Ankle dorsiflexion       Ankle plantarflexion       Ankle inversion       Ankle eversion        (Blank rows = not tested)  FUNCTIONAL TESTS:  Sit<>stand independently to device without UE support  GAIT: Distance walked: 50 ft Assistive device utilized: None Level of assistance: Independent Comments: L trunk lean, compensated Trendelenburg  [facility] Adult PT Treatment:                                                DATE: 10/19/23 Therapeutic Exercise: Reviewed, demonstrated, and performed advanced HEP discussing frequency,  sets, reps, and ways to progress independently.   Therapeutic Activity: Re-assessment to determine  overall progress, educating patient on progress towards goals.   Self Care: Recommended to f/u with spine specialist for ongoing treatment of SIJ pain.  Reviewed post-operative protocol, activities to currently avoid   [facility] Adult PT Treatment:                                                DATE: 09/30/2023 Therapeutic Exercise: New HEP review: SKTC 3x30 (Rt) Modified figure 4 piriformis stretch 3x30 Lateral lunge Add stretch x30 Standing hip flexor stretch 2x30 Hip MMT Neuromuscular re-ed: Bridges + GTB in PPT 2x10 Prone hip extension (2 pillows under stomach) 2x10 Rt SLB  Therapeutic Activity: Walking for distance without pain (178') Wide stance lateral weight shifting LEFS   [facility] Adult PT Treatment:                                                 DATE: 09/23/23 Mulligan hip mob lateral and inf at 90 grade III- R hip S/L hip ext mob grade III STM R glute  Hip ABD iso 10s 10x red TB at knees Seated lumbar flexion reaching stretch with 6 box under R foot 10s 10x Standing hip flexor stretch (toe out 10 deg) 30s 3x Adductor lunge stretch 30s 3x    PATIENT EDUCATION:  Education details: HEP update; d/c education  Person educated: Patient Education method: Explanation, Demonstration, and Handouts Education comprehension: verbalized understanding and returned demonstration  HOME EXERCISE PROGRAM: Access Code: [email or web address] URL: [email or web address] Date: 10/19/2023 Prepared by: Lucie Meeter  Exercises - Standing Glute Med Mobilization with Small Ball on Wall  - 1 x daily - 3 x weekly - 1 sets - 10 reps - Bridge with Posterior Pelvic Tilt  - 1 x daily - 3 x weekly - 2 sets - 10 reps - 1-2 hold - Supine Hip Internal and External Rotation  - 1 x daily - 3 x weekly - 2 sets - 10 reps - Supine Piriformis Stretch with Foot on Ground  - 1 x daily - 3 x weekly - 1 sets - 3 reps - 30 hold - Standing Hip Flexor Stretch  - 1 x daily - 3 x weekly - 1 sets - 2 reps -  30 hold - Hooklying Single Knee to Chest Stretch  - 1 x daily - 3 x weekly - 1 sets - 3 reps - 30 hold - Lateral Lunge Adductor Stretch with Counter Support  - 1 x daily - 3 x weekly - 1 sets - 3 reps - 30 hold - Sidelying Hip Abduction  - 1 x daily - 3 x weekly - 2 sets - 10 reps - Single Leg Stance  - 1 x daily - 3 x weekly - 3 sets - max  hold - Small Range Straight Leg Raise  - 1 x daily - 3 x weekly - 2 sets - 5 reps - Sit to Stand Without Arm Support  - 1 x daily - 3 x weekly - 2 sets - 10 reps  ASSESSMENT:  CLINICAL IMPRESSION:  Patient is nearly 12 weeks s/p Rt hip arthroscopic with pincer debridement and labral repair reporting ongoing pain  mostly isolated to the Rt SIJ and occasional Rt lateral hip pain. The SIJ pain limits her tolerance to standing, sitting, walking, and sleep. Upon re-assessment she is noted to have functional Rt hip AROM noting pain about Rt SIJ with hip flexion. She has good strength about the Rt hip with mild gluteal weakness remaining and overall improvements in balance. Her femoroacetabular joint seems to be doing well from a post-operative perspective. Her scoliosis and associated postural impairments appear to be an underlying factor. She was recently seen by a spine specialist and has plans to undergo injections starting next week. At this time it was recommended that she continue under the care and treatment of spine specialist for her SIJ pain and that she is appropriate for discharge from PT with patient in agreement with this plan.   EVAL: Patient is a 68 y.o. female who was seen today for physical therapy evaluation and treatment S/P R hip arthroscopic surgery. She presents with impairments in hip ROM, muscle strength, functional mobility, gait speed, and balance. She also has h/o scoliosis which leads to asymmetry. Patient presents today with excessive ROM into ER and PT reviewed limitations of ROM at this time. Patient will benefit from PT services to improve  strength, progress ambulation, and return to ADLs and IADLs.  OBJECTIVE IMPAIRMENTS: Abnormal gait, decreased activity tolerance, decreased balance, decreased endurance, decreased ROM, decreased strength, hypomobility, increased fascial restrictions, impaired flexibility, postural dysfunction, and pain.   GOALS: Goals reviewed with patient? Yes  SHORT TERM GOALS: Target date: 09/04/23    Patient will be independent and compliant with initial HEP.    Baseline: issued at eval  Goal status: MET   2.  Patient will demonstrate step through gait pattern without AD.  Baseline: step to with crutches Goal status: MET on 08/21/23   3.  Patient will demonstrate at least 90 degrees of active hip flexion ROM to improve ability to complete squatting activity.  Baseline: see PROM above 09/03/23: 90 degrees AROM Goal status: MET   4.  Patient will be able to ascend/descend stairs with reciprocal pattern. Baseline: step to with crutches  Goal status: MET   LONG TERM GOALS: Target date: 10/04/23     Patient will score >/= 40% on the LEFS (MCID is 9) to signify clinically meaningful improvement in functional abilities.    Baseline: 13.8%  09/03/23: 26/80 = 35.0% 09/30/23: 20/80 = 25/0% 10/19/23: 21/80= 26.3%  Goal status: NOT MET    2.  Patient will demonstrate at least 4/5 R hip strength to improve stability with walking/standing activity.  Baseline: deferred  09/30/23: see above Goal status: MET   3.  Patient will demonstrate functional and pain free R hip ROM.  Baseline: see PROM above  09/30/23: no pain Goal status: MET   4.  Patient will be able to ambulate community distances without AD without hip pain.  Baseline: see gait above  09/30/23: 178' 10/19/23: 20-30 steps and then has SIJ pain  Goal status: NOT MET   5.  Patient will be independent with advanced home program to progress/maintain current level of function.  Baseline: initial HEP issued  Goal status: MET   6.  Patient will maintain  SLS on the RLE for at least 10 seconds to improve gait stability.  Baseline: unable  09/30/23: 8 sec 10/19/23: 15 seconds  Goal status: MET  PLAN:  PT FREQUENCY: 2x/week  PT DURATION: 8 weeks  PLANNED INTERVENTIONS: 97164- PT Re-evaluation, 97110-Therapeutic exercises, 97530- Therapeutic activity, 97112-  Neuromuscular re-education, [phone]- Self Care, [id number]- Manual therapy, [phone]- Gait training, Patient/Family education, Balance training, Taping, Dry Needling, Cryotherapy, and Moist heat   Lucie Meeter, PT, DPT, ATC 10/19/23 2:50 PM

## 2023-10-20 ENCOUNTER — Other Ambulatory Visit (HOSPITAL_BASED_OUTPATIENT_CLINIC_OR_DEPARTMENT_OTHER): Payer: Self-pay | Admitting: Orthopaedic Surgery

## 2023-10-20 ENCOUNTER — Telehealth (HOSPITAL_BASED_OUTPATIENT_CLINIC_OR_DEPARTMENT_OTHER): Payer: Self-pay | Admitting: Orthopaedic Surgery

## 2023-10-20 DIAGNOSIS — M25851 Other specified joint disorders, right hip: Secondary | ICD-10-CM

## 2023-10-20 NOTE — Telephone Encounter (Signed)
 Patient states the referral that Dr B sent to [facility] is not in network and she wants to know what are her other options. Patients best contact number [phone]

## 2023-10-21 DIAGNOSIS — L820 Inflamed seborrheic keratosis: Secondary | ICD-10-CM | POA: Diagnosis not present

## 2023-10-21 DIAGNOSIS — L570 Actinic keratosis: Secondary | ICD-10-CM | POA: Diagnosis not present

## 2023-10-22 ENCOUNTER — Ambulatory Visit

## 2023-10-23 ENCOUNTER — Other Ambulatory Visit: Payer: Self-pay | Admitting: Physician Assistant

## 2023-10-23 DIAGNOSIS — K2100 Gastro-esophageal reflux disease with esophagitis, without bleeding: Secondary | ICD-10-CM

## 2023-10-26 ENCOUNTER — Encounter

## 2023-10-27 DIAGNOSIS — M5126 Other intervertebral disc displacement, lumbar region: Secondary | ICD-10-CM | POA: Diagnosis not present

## 2023-10-27 DIAGNOSIS — M47816 Spondylosis without myelopathy or radiculopathy, lumbar region: Secondary | ICD-10-CM | POA: Diagnosis not present

## 2023-10-29 ENCOUNTER — Encounter: Admitting: Rehabilitative and Restorative Service Providers"

## 2023-11-05 DIAGNOSIS — H61302 Acquired stenosis of left external ear canal, unspecified: Secondary | ICD-10-CM | POA: Diagnosis not present

## 2023-11-05 DIAGNOSIS — H6122 Impacted cerumen, left ear: Secondary | ICD-10-CM | POA: Diagnosis not present

## 2023-11-09 ENCOUNTER — Telehealth: Payer: Self-pay | Admitting: Orthopaedic Surgery

## 2023-11-09 NOTE — Telephone Encounter (Signed)
 Patient called and has a funeral to go to and needs to reschedule her post op. CB#[phone]

## 2023-11-11 ENCOUNTER — Encounter (HOSPITAL_BASED_OUTPATIENT_CLINIC_OR_DEPARTMENT_OTHER): Admitting: Orthopaedic Surgery

## 2023-11-16 DIAGNOSIS — N3281 Overactive bladder: Secondary | ICD-10-CM | POA: Diagnosis not present

## 2023-11-17 DIAGNOSIS — M41125 Adolescent idiopathic scoliosis, thoracolumbar region: Secondary | ICD-10-CM | POA: Diagnosis not present

## 2023-11-21 DIAGNOSIS — M41125 Adolescent idiopathic scoliosis, thoracolumbar region: Secondary | ICD-10-CM | POA: Diagnosis not present

## 2023-11-23 DIAGNOSIS — M47816 Spondylosis without myelopathy or radiculopathy, lumbar region: Secondary | ICD-10-CM | POA: Diagnosis not present

## 2023-11-25 ENCOUNTER — Ambulatory Visit (INDEPENDENT_AMBULATORY_CARE_PROVIDER_SITE_OTHER): Admitting: Orthopaedic Surgery

## 2023-11-25 DIAGNOSIS — M25851 Other specified joint disorders, right hip: Secondary | ICD-10-CM

## 2023-11-25 NOTE — Progress Notes (Signed)
 Post Operative Evaluation    Procedure/Date of Surgery: Right hip pincer debridement 5/1  Interval History:    Presents today for follow-up status post above procedure.  He is still having radiating pain about the lateral hip.   PMH/PSH/Family History/Social History/Meds/Allergies:    Past Medical History:  Diagnosis Date   Allergy 1992   Anxiety    Arthritis    right little finger; base of thumb left hand; spine (07/07/2017)   Asthma, chronic 11/27/2015   Cataract 2021   Chronic back pain    Coronary artery disease    4/19 PCI/DEx2 to LAD, and PCI/DES x1 to RCA, normal EF   Depression    Diabetes mellitus without complication (HCC) 2020   Diverticulitis of colon 12/21/2015   Colonoscopy every 5 years/digestive health.    GERD (gastroesophageal reflux disease) 11/27/2015   History of blood transfusion    related to spinal fusions   Hyperlipidemia LDL goal <70 11/29/2018   Hypertension    Hypothyroidism    Idiopathic scoliosis 01/22/2016   Osteoporosis 11/27/2015   On prolia .    Pneumonia 2016   Pneumothorax 06/26/2017   Pre-diabetes    Sleep apnea 2020   does not use CPAP   Thyroid  disease    Past Surgical History:  Procedure Laterality Date   ABDOMINAL HYSTERECTOMY     bladder mesh     S/P bladder sling   COLON SURGERY  2007   CORONARY PRESSURE/FFR STUDY N/A 07/07/2017   Procedure: INTRAVASCULAR PRESSURE WIRE/FFR STUDY;  Surgeon: Anner Alm ORN, MD;  Location: [facility] INVASIVE CV LAB;  Service: Cardiovascular;  Laterality: N/A;   CORONARY STENT INTERVENTION N/A 07/07/2017   Procedure: CORONARY STENT INTERVENTION;  Surgeon: Anner Alm ORN, MD;  Location: [facility] INVASIVE CV LAB;  Service: Cardiovascular;  Laterality: N/A;   ELBOW SURGERY Right    dislocation   HIP ARTHROSCOPY Right 07/30/2023   Procedure: ARTHROSCOPY HIP;  Surgeon: Genelle Standing, MD;  Location: [facility];  Service: Orthopedics;  Laterality:  Right;  RIGHT HIP ARTHROSCOPY WITH PINCER DEBRIDEMENT   INCONTINENCE SURGERY     didn't work   JOINT REPLACEMENT     LEFT HEART CATH AND CORONARY ANGIOGRAPHY N/A 07/07/2017   Procedure: LEFT HEART CATH AND CORONARY ANGIOGRAPHY;  Surgeon: Anner Alm ORN, MD;  Location: [facility] INVASIVE CV LAB;  Service: Cardiovascular;  Laterality: N/A;   SMALL INTESTINE SURGERY     SPINAL FUSION  ~ 1970 X 3   below neck - lower back   TONSILLECTOMY     TOTAL HIP ARTHROPLASTY Left    Social History   Socioeconomic History   Marital status: Married    Spouse name: Marinell   Number of children: 1   Years of education: 16   Highest education level: Bachelor's degree (e.g., BA, AB, BS)  Occupational History   Occupation: Retired  Tobacco Use   Smoking status: Never   Smokeless tobacco: Never  Vaping Use   Vaping status: Never Used  Substance and Sexual Activity   Alcohol use: Yes    Comment: Maybe 3 drinks/year   Drug use: No   Sexual activity: Not Currently    Birth control/protection: None, Surgical    Comment: hyst  Other Topics Concern   Not on file  Social History Narrative   Lives  with her husband. She has one child. She enjoys playing games on the computer.   Social Drivers of Corporate investment banker Strain: Low Risk  (10/15/2023)   Overall Financial Resource Strain (CARDIA)    Difficulty of Paying Living Expenses: Not hard at all  Food Insecurity: No Food Insecurity (10/15/2023)   Hunger Vital Sign    Worried About Running Out of Food in the Last Year: Never true    Ran Out of Food in the Last Year: Never true  Transportation Needs: No Transportation Needs (10/15/2023)   PRAPARE - Administrator, Civil Service (Medical): No    Lack of Transportation (Non-Medical): No  Physical Activity: Inactive (10/15/2023)   Exercise Vital Sign    Days of Exercise per Week: 0 days    Minutes of Exercise per Session: 0 min  Stress: No Stress Concern Present (10/15/2023)   [employer] of Occupational Health - Occupational Stress Questionnaire    Feeling of Stress: Not at all  Social Connections: Moderately Integrated (10/15/2023)   Social Connection and Isolation Panel    Frequency of Communication with Friends and Family: Twice a week    Frequency of Social Gatherings with Friends and Family: Once a week    Attends Religious Services: More than 4 times per year    Active Member of [employer] or Organizations: No    Attends Engineer, structural: Never    Marital Status: Married   Family History  Problem Relation Age of Onset   Cancer Mother        lung   Depression Mother    Early death Mother    Heart attack Father    Hyperlipidemia Father    Hypertension Father    Arthritis Father    Heart disease Father    Diabetes Maternal Aunt    Hyperlipidemia Paternal Aunt    COPD Paternal Aunt    Hypertension Paternal Aunt    Stroke Paternal Aunt    Allergies  Allergen Reactions   Hydromorphone Rash   Levofloxacin Other (See Comments)    Hallucinations   Meperidine Nausea And Vomiting and Other (See Comments)    Also doesn't work well for my pain   Zaleplon Other (See Comments)    Trouble breathing   Broccoli [Brassica Oleracea] Nausea Only and Other (See Comments)    Causes stomach pain also   Adhesive [Tape] Rash   Current Outpatient Medications  Medication Sig Dispense Refill   albuterol  (VENTOLIN  HFA) 108 (90 Base) MCG/ACT inhaler INHALE 1-2 PUFFS INTO THE LUNGS EVERY 4 HOURS AS NEEDED FOR WHEEZING OR SHORTNESS OF BREATH. 18 g 2   aspirin  EC 81 MG tablet Take 81 mg by mouth daily. Swallow whole.     atorvastatin  (LIPITOR) 20 MG tablet Take 1 tablet (20 mg total) by mouth daily. 90 tablet 3   brexpiprazole (REXULTI) 1 MG TABS tablet Take 1 mg by mouth daily.     busPIRone  (BUSPAR ) 15 MG tablet Take 60 mg by mouth daily.     calcium -vitamin D  (OSCAL WITH D) 500-200 MG-UNIT tablet Take 1 tablet by mouth daily.     clobetasol  (TEMOVATE ) 0.05  % external solution Apply 1 Application topically as needed.     clonazePAM  (KLONOPIN ) 0.5 MG tablet Take 0.5 mg by mouth at bedtime.     denosumab  (PROLIA ) 60 MG/ML SOLN injection Inject 60 mg into the skin every 6 (six) months. Administer in upper arm, thigh, or abdomen  desvenlafaxine  (PRISTIQ ) 100 MG 24 hr tablet Take 100 mg by mouth in the morning.     Empagliflozin-metFORMIN  HCl ER (SYNJARDY  XR) 25-1000 MG TB24 Take 1 tablet by mouth daily. 90 tablet 1   fluticasone  (FLONASE ) 50 MCG/ACT nasal spray SPRAY 2 SPRAYS INTO EACH NOSTRIL EVERY DAY 48 mL 1   gabapentin  (NEURONTIN ) 300 MG capsule TAKE 1 CAPSULE IN THE MORNING AND 2 CAPSULES AT BEDTIME (Patient taking differently: Take 600 mg by mouth at bedtime. TAKE  2 CAPSULES AT BEDTIME) 270 capsule 3   ketoconazole (NIZORAL) 2 % shampoo Apply topically.     levothyroxine  (SYNTHROID ) 137 MCG tablet TAKE 1 TABLET ONCE A WEEK THEN 1/2 TABLET ALL OTHER DAYS AS DIRECTED 52 tablet 2   Menthol, Topical Analgesic, (MINERAL ICE EX) Apply 1 application to affected sites one to two times a day as needed (for pain) (Patient not taking: Reported on 10/15/2023)     metoprolol  succinate (TOPROL -XL) 25 MG 24 hr tablet TAKE 1 TABLET (25 MG TOTAL) BY MOUTH DAILY. 90 tablet 3   nitroGLYCERIN  (NITROSTAT ) 0.4 MG SL tablet DISSOLVE 1 TAB UNDER THE TONGUE FOR CHEST PAIN EVERY 5 MINUTES UP TO 3 DOSES IF PAIN PERSIST CALL 911/SEEK MEDICAL HELP 75 tablet 2   Omega-3 Fatty Acids (FISH OIL PO) Take 1 capsule by mouth daily.      pantoprazole  (PROTONIX ) 40 MG tablet TAKE 1 TABLET (40 MG TOTAL) BY MOUTH TWICE A DAY BEFORE MEALS 180 tablet 3   trazodone  (DESYREL ) 300 MG tablet Take 300 mg by mouth at bedtime.     Current Facility-Administered Medications  Medication Dose Route Frequency Provider Last Rate Last Admin   [START ON 03/06/2024] denosumab  (PROLIA ) injection 60 mg  60 mg Subcutaneous Q6 months Breeback, Jade L, PA-C       No results found.  Review of Systems:    A ROS was performed including pertinent positives and negatives as documented in the HPI.   Musculoskeletal Exam:    There were no vitals taken for this visit.  Right hip incisions are well-appearing without erythema or drainage.  Some tenderness about the greater trochanter which radiates to the posterior buttocks without any groin pain.  30 degrees internal/external rotation of the right hip without pain  Imaging:      I personally reviewed and interpreted the radiographs.   Assessment:   Status post right hip pincer debridement doing well from a femoral acetabular standpoint.  She does have quite significant gluteal symptoms at today's visit.  I do believe that much of this is overcompensation as result of her leftward leaning gait in the setting of known scoliosis.  I will plan to see her back in 2 months to check her progress and I would like her to continue to work on gluteal strengthening although I would also like to plan to refer her to [facility] for discussion of complex scoliosis deformity correction given the fact that she is still young and still highly active Plan :    - Return to clinic as needed        I personally saw and evaluated the patient, and participated in the management and treatment plan.  Elspeth Parker, MD Attending Physician, Orthopedic Surgery  This document was dictated using Dragon voice recognition software. A reasonable attempt at proof reading has been made to minimize errors.

## 2023-11-26 ENCOUNTER — Other Ambulatory Visit: Payer: Self-pay | Admitting: Physician Assistant

## 2023-11-26 DIAGNOSIS — E039 Hypothyroidism, unspecified: Secondary | ICD-10-CM

## 2023-12-01 ENCOUNTER — Ambulatory Visit (INDEPENDENT_AMBULATORY_CARE_PROVIDER_SITE_OTHER): Admitting: Physician Assistant

## 2023-12-01 ENCOUNTER — Encounter: Payer: Self-pay | Admitting: Physician Assistant

## 2023-12-01 VITALS — BP 110/57 | HR 57 | Ht <= 58 in | Wt 122.0 lb

## 2023-12-01 DIAGNOSIS — E1122 Type 2 diabetes mellitus with diabetic chronic kidney disease: Secondary | ICD-10-CM

## 2023-12-01 DIAGNOSIS — N183 Chronic kidney disease, stage 3 unspecified: Secondary | ICD-10-CM | POA: Diagnosis not present

## 2023-12-01 DIAGNOSIS — E118 Type 2 diabetes mellitus with unspecified complications: Secondary | ICD-10-CM

## 2023-12-01 DIAGNOSIS — E039 Hypothyroidism, unspecified: Secondary | ICD-10-CM

## 2023-12-01 DIAGNOSIS — M47816 Spondylosis without myelopathy or radiculopathy, lumbar region: Secondary | ICD-10-CM | POA: Diagnosis not present

## 2023-12-01 DIAGNOSIS — M47817 Spondylosis without myelopathy or radiculopathy, lumbosacral region: Secondary | ICD-10-CM | POA: Diagnosis not present

## 2023-12-01 DIAGNOSIS — E1169 Type 2 diabetes mellitus with other specified complication: Secondary | ICD-10-CM

## 2023-12-01 DIAGNOSIS — Z7984 Long term (current) use of oral hypoglycemic drugs: Secondary | ICD-10-CM

## 2023-12-01 DIAGNOSIS — E785 Hyperlipidemia, unspecified: Secondary | ICD-10-CM | POA: Diagnosis not present

## 2023-12-01 LAB — POCT GLYCOSYLATED HEMOGLOBIN (HGB A1C): Hemoglobin A1C: 6.5 % — AB (ref 4.0–5.6)

## 2023-12-01 NOTE — Progress Notes (Addendum)
 Established Patient Office Visit  Subjective   Patient ID: Leslie Roberson, female    DOB: 1955-12-03  Age: 68 y.o. MRN: [id number]  Chief Complaint  Patient presents with   Medical Management of Chronic Issues    HPI Pt is a 68 yo female with T2DM, HTN, HLD, Osteoporosis, GERD who presents to the clinic for follow up.   Pt is not checking sugars. She is compliant with synjardy  daily. She denies any hypoglycemic symptoms. She denies any CP, palpitations, headaches, vision changes. She is staying pretty active and [place]. She admits to keeping a pretty good low sugar/carb diet.   She see Dr. Pietro, cardiologist, and said he wants an UTD lipid panel.    Review of Systems  All other systems reviewed and are negative.     Objective:     BP (!) 110/57   Pulse (!) 57   Ht 4' 10 (1.473 m)   Wt 122 lb (55.3 kg)   SpO2 99%   BMI 25.50 kg/m  BP Readings from Last 3 Encounters:  12/01/23 (!) 110/57  10/08/23 110/70  09/14/23 120/75   Wt Readings from Last 3 Encounters:  12/01/23 122 lb (55.3 kg)  10/15/23 120 lb (54.4 kg)  10/08/23 118 lb 9.6 oz (53.8 kg)      Physical Exam Constitutional:      Appearance: Normal appearance.  HENT:     Head: Normocephalic.  Cardiovascular:     Rate and Rhythm: Normal rate and regular rhythm.  Pulmonary:     Effort: Pulmonary effort is normal.     Breath sounds: Normal breath sounds.  Musculoskeletal:     Comments: Scoliosis of spine  Neurological:     Mental Status: She is alert.  Psychiatric:        Mood and Affect: Mood normal.      Results for orders placed or performed in visit on 12/01/23  Lipid panel  Result Value Ref Range   Cholesterol, Total 153 100 - 199 mg/dL   Triglycerides 82 0 - 149 mg/dL   HDL 80 >60 mg/dL   VLDL Cholesterol Cal 15 5 - 40 mg/dL   LDL Chol Calc (NIH) 58 0 - 99 mg/dL   Chol/HDL Ratio 1.9 0.0 - 4.4 ratio  TSH + free T4  Result Value Ref Range    TSH 3.130 0.450 - 4.500 uIU/mL   Free T4 1.57 0.82 - 1.77 ng/dL  POCT HgB J8R  Result Value Ref Range   Hemoglobin A1C 6.5 (A) 4.0 - 5.6 %   HbA1c POC (<> result, manual entry)     HbA1c, POC (prediabetic range)     HbA1c, POC (controlled diabetic range)         Assessment & Plan:  Leslie Roberson was seen today for medical management of chronic issues.  Diagnoses and all orders for this visit:  Controlled type 2 diabetes mellitus with stage 3 chronic kidney disease, without long-term current use of insulin (HCC) -     POCT HgB A1C  Dyslipidemia, goal LDL below 70 -     Lipid panel  Acquired hypothyroidism -     TSH + free T4   A1C to goal Continue synjardy  BP to goal On statin, lipid panel ordered today GFR below 60 at last check, on SGLT-2 for protection of kidneys Eye and foot exam UTD   [facility], pt is UTD with immunizations  Next prolia  shot is due after December 4th  GERD-controlled with protonix , refills at pharmacy   Return in about 3 months (around 03/05/2024).    Cynia Abruzzo, PA-C

## 2023-12-01 NOTE — Patient Instructions (Signed)
 December 4th next prolia  shot.

## 2023-12-02 ENCOUNTER — Encounter: Payer: Self-pay | Admitting: Physician Assistant

## 2023-12-02 DIAGNOSIS — E039 Hypothyroidism, unspecified: Secondary | ICD-10-CM | POA: Diagnosis not present

## 2023-12-02 DIAGNOSIS — E785 Hyperlipidemia, unspecified: Secondary | ICD-10-CM | POA: Diagnosis not present

## 2023-12-03 ENCOUNTER — Ambulatory Visit: Payer: Self-pay | Admitting: Cardiology

## 2023-12-03 DIAGNOSIS — N3281 Overactive bladder: Secondary | ICD-10-CM | POA: Diagnosis not present

## 2023-12-03 DIAGNOSIS — G4733 Obstructive sleep apnea (adult) (pediatric): Secondary | ICD-10-CM | POA: Diagnosis not present

## 2023-12-03 LAB — LIPID PANEL
Chol/HDL Ratio: 1.9 ratio (ref 0.0–4.4)
Cholesterol, Total: 153 mg/dL (ref 100–199)
HDL: 80 mg/dL (ref >39)
LDL Chol Calc (NIH): 58 mg/dL (ref 0–99)
Triglycerides: 82 mg/dL (ref 0–149)
VLDL Cholesterol Cal: 15 mg/dL (ref 5–40)

## 2023-12-03 LAB — TSH+FREE T4
Free T4: 1.57 ng/dL (ref 0.82–1.77)
TSH: 3.13 u[IU]/mL (ref 0.450–4.500)

## 2023-12-04 NOTE — Progress Notes (Signed)
 Ronal Dragon,   Cholesterol looks great.  Thyroid  looks great.

## 2023-12-30 DIAGNOSIS — M47816 Spondylosis without myelopathy or radiculopathy, lumbar region: Secondary | ICD-10-CM | POA: Diagnosis not present

## 2024-01-15 ENCOUNTER — Telehealth: Payer: Self-pay | Admitting: Cardiology

## 2024-01-15 NOTE — Telephone Encounter (Signed)
  Per MyChart scheduling message:  Pt c/o Shortness Of Breath: STAT if SOB developed within the last 24 hours or pt is noticeably SOB on the phone  1. Are you currently SOB (can you hear that pt is SOB on the phone)?   2. How long have you been experiencing SOB?   3. Are you SOB when sitting or when up moving around?   4. Are you currently experiencing any other symptoms?   1) I am currently short of breath. 2) I have been experiencing shortness of breath for about a month. 3) I am short of breath when sitting and when up moving around. 4) I am not experiencing any other symptoms.

## 2024-01-15 NOTE — Telephone Encounter (Signed)
 Spoke with patient who stated she had been having shortness of breath, had some lower extremity swelling which has subsided.  Denies chest pain   Scheduled appointment with Leslie BROCKS NP for 10/21 Patient aware of date, time, and location

## 2024-01-17 ENCOUNTER — Encounter: Payer: Self-pay | Admitting: Physician Assistant

## 2024-01-18 MED ORDER — GABAPENTIN 300 MG PO CAPS: ORAL_CAPSULE | ORAL | 1 refills | Status: AC

## 2024-01-19 ENCOUNTER — Ambulatory Visit: Attending: Physician Assistant | Admitting: Physician Assistant

## 2024-01-19 VITALS — BP 110/60 | HR 75 | Ht <= 58 in | Wt 122.0 lb

## 2024-01-19 DIAGNOSIS — R0609 Other forms of dyspnea: Secondary | ICD-10-CM | POA: Diagnosis not present

## 2024-01-19 DIAGNOSIS — E785 Hyperlipidemia, unspecified: Secondary | ICD-10-CM

## 2024-01-19 DIAGNOSIS — I25118 Atherosclerotic heart disease of native coronary artery with other forms of angina pectoris: Secondary | ICD-10-CM

## 2024-01-19 DIAGNOSIS — R0600 Dyspnea, unspecified: Secondary | ICD-10-CM | POA: Diagnosis not present

## 2024-01-19 NOTE — Progress Notes (Signed)
 Cardiology Office Note   Date:  01/19/2024  ID:  Leslie Roberson, DOB [birth year], MRN [id number] PCP: Leslie Vermell LITTIE DEVONNA  [facility] Providers Cardiologist:  Leslie Shallow, MD   History of Present Illness Leslie Roberson is a 68 y.o. female who was seen initially by Dr. Shallow back in March 2019 with chest pain.  She was scheduled for a cardiac catheterization but patient subsequently presented with pneumothorax.  She underwent a cardiac catheterization in April 2019 and was found to have 99% mid to distal right coronary artery stenosis, 75% proximal LAD followed by 60% lesion and normal LV function.  Patient had PCI of both lesions and with drug-eluting stents echocardiogram May 2022 showed normal LV function, grade 1 DD.  Nuclear study March 2025 showed ejection fraction of 77% with no ischemia or infarction.  Since last seen the patient had dyspnea with more extreme activities but not with routine activities relieved with some rest.  Not associate with any chest pain.  There is no orthopnea.  No PND or pedal edema.  No syncope or palpitations.  No exertional chest pain.  Today, she presents today with ongoing shortness of breath since a trip to [place] .  She experiences persistent shortness of breath, which began during a trip to [place]  in September and continues despite returning to normal altitude. There is no chest pain, pressure, or lower extremity edema, except when traveling. She occasionally feels a rare 'skippy' sensation but no significant palpitations.  Her medications include aspirin  81 mg daily, Lipitor 20 mg daily, metoprolol  succinate 25 mg daily, and as-needed nitroglycerin , which she has not used recently.  A stress test in February showed no EKG changes suggestive of coronary artery blockage. An echocardiogram in May 2022 indicated strong heart pump function with slightly impaired relaxation.  Reports no chest pain, pressure, or tightness. No edema,  orthopnea, PND. Reports no palpitations.   Discussed the use of AI scribe software for clinical note transcription with the patient, who gave verbal consent to proceed.   ROS: pertinent ROS in HPI  Studies Reviewed  Cardiac Cath 07/07/17  Culprit Lesion #1: Mid RCA to Dist RCA lesion is 99% stenosed. A drug-eluting stent was successfully placed using a STENT SYNERGY DES 3X16. - Post-dilated to 3.3 mm Post intervention, there is a 0% residual stenosis. Ost LAD lesion is 30% stenosed. Mid LAD lesion is 30% stenosed. (before & after stent) Prox LAD-1 lesion is 75% stenosed. Prox LAD-2 lesion is 60% stenosed. -- FFR 0.73 A drug-eluting stent was successfully placed using a STENT SYNERGY DES 2.5X28. - Post-dilated to 2.8 mm Post intervention, there is a 0% residual stenosis. The left ventricular systolic function is normal. The left ventricular ejection fraction is 55-65% by visual estimate. LV end diastolic pressure is mildly elevated. There is no aortic valve stenosis.   Severe 2 Vessel CAD - m-dRCA 99%, p-mLAD 75% & 60% (FFR 0.73) -- Successful 2 Vessel DES PCI    Plan: Overnight observation post PCI. Zephyr band removal per protocol DAPT For minimum of 6 months uninterrupted, at that point, would be okay to temporarily hold for procedures if necessary, but preferably 1 year uninterrupted Continue risk factor modification with lipid and glycemic management along with blood pressure control.   Anticipate discharge tomorrow, follow-up with Dr. Shallow  EKG Interpretation Date/Time:  Tuesday January 19 2024 15:11:00 EDT Ventricular Rate:  74 PR Interval:  134 QRS Duration:  72 QT Interval:  372 QTC Calculation: 412 R Axis:  53  Text Interpretation: Normal sinus rhythm Possible Left atrial enlargement When compared with ECG of 28-Jul-2023 12:29, No significant change was found Confirmed by Leslie Roberson [phone]) on 01/19/2024 3:15:16 PM    Lexiscan  Myoview  05/27/23  The study is  normal. The study is low risk.   No ST deviation was noted. The ECG was not diagnostic due to pharmacologic protocol.   LV perfusion is normal. There is no evidence of ischemia. There is no evidence of infarction.   Left ventricular function is normal. Nuclear stress EF: 77%. The left ventricular ejection fraction is hyperdynamic (>65%). End diastolic cavity size is normal. End systolic cavity size is normal.   Prior study not available for comparison.      Physical Exam VS:  BP 110/60   Pulse 75   Ht 4' 10 (1.473 m)   Wt 122 lb (55.3 kg)   SpO2 96%   BMI 25.50 kg/m        Wt Readings from Last 3 Encounters:  01/19/24 122 lb (55.3 kg)  12/01/23 122 lb (55.3 kg)  10/15/23 120 lb (54.4 kg)    GEN: Well nourished, well developed in no acute distress NECK: No JVD; No carotid bruits CARDIAC: RRR, no murmurs, rubs, gallops RESPIRATORY:  Clear to auscultation without rales, wheezing or rhonchi  ABDOMEN: Soft, non-tender, non-distended EXTREMITIES:  No edema; No deformity   ASSESSMENT AND PLAN  Dyspnea Persistent dyspnea since September, exacerbated by altitude. Normal sinus rhythm on EKG. Previous echocardiogram showed strong heart pump function with slightly impaired relaxation. - Order echocardiogram to evaluate heart pump function, valvular function, and structure. - If echo is normal would suggest referral to pulmonary for PFTs/asthma workup  Atherosclerotic heart disease of native coronary artery with angina No recent angina or nitroglycerin  use. February stress test showed no EKG changes. Blood pressure 110/60, heart rate in seventies.  Hyperlipidemia Lipid panel: LDL 58, HDL 80, triglycerides 82, total cholesterol 153. Current medications: aspirin  81 mg daily, Lipitor 20 mg daily, metoprolol  succinate 25 mg daily.  Diabetes Mellitus A1c at 6.5. Coordinating management with primary care physician.  General Health Maintenance Potassium, electrolytes, creatinine, and TSH  normal.  Follow-up - Follow up in 6 months with Doctor Pietro.      Dispo: She can see Dr. Pietro in 6 months  Signed, Roberson LOISE Lucien, PA-C

## 2024-01-19 NOTE — Patient Instructions (Signed)
 Medication Instructions:  Your physician recommends that you continue on your current medications as directed. Please refer to the Current Medication list given to you today. *If you need a refill on your cardiac medications before your next appointment, please call your pharmacy*  Lab Work: NONE ORDERED If you have labs (blood work) drawn today and your tests are completely normal, you will receive your results only by: MyChart Message (if you have MyChart) OR A paper copy in the mail If you have any lab test that is abnormal or we need to change your treatment, we will call you to review the results.  Testing/Procedures: Your physician has requested that you have an echocardiogram. Echocardiography is a painless test that uses sound waves to create images of your heart. It provides your doctor with information about the size and shape of your heart and how well your heart's chambers and valves are working. This procedure takes approximately one hour. There are no restrictions for this procedure. Please do NOT wear cologne, perfume, aftershave, or lotions (deodorant is allowed). Please arrive 15 minutes prior to your appointment time.  Please note: We ask at that you not bring children with you during ultrasound (echo/ vascular) testing. Due to room size and safety concerns, children are not allowed in the ultrasound rooms during exams. Our front office staff cannot provide observation of children in our lobby area while testing is being conducted. An adult accompanying a patient to their appointment will only be allowed in the ultrasound room at the discretion of the ultrasound technician under special circumstances. We apologize for any inconvenience.   Follow-Up: At [facility], you and your health needs are our priority.  As part of our continuing mission to provide you with exceptional heart care, our providers are all part of one team.  This team includes your primary Cardiologist  (physician) and Advanced Practice Providers or APPs (Physician Assistants and Nurse Practitioners) who all work together to provide you with the care you need, when you need it.  Your next appointment:   6 month(s)  Provider:   Redell Shallow, MD    We recommend signing up for the patient portal called MyChart.  Sign up information is provided on this After Visit Summary.  MyChart is used to connect with patients for Virtual Visits (Telemedicine).  Patients are able to view lab/test results, encounter notes, upcoming appointments, etc.  Non-urgent messages can be sent to your provider as well.   To learn more about what you can do with MyChart, go to [email or web address].   Other Instructions

## 2024-01-26 ENCOUNTER — Other Ambulatory Visit: Payer: Self-pay

## 2024-01-28 DIAGNOSIS — M47816 Spondylosis without myelopathy or radiculopathy, lumbar region: Secondary | ICD-10-CM | POA: Diagnosis not present

## 2024-01-28 DIAGNOSIS — M5416 Radiculopathy, lumbar region: Secondary | ICD-10-CM | POA: Diagnosis not present

## 2024-02-01 ENCOUNTER — Encounter: Payer: Self-pay | Admitting: Radiology

## 2024-02-08 ENCOUNTER — Encounter: Payer: Self-pay | Admitting: Physician Assistant

## 2024-02-09 ENCOUNTER — Ambulatory Visit (INDEPENDENT_AMBULATORY_CARE_PROVIDER_SITE_OTHER)

## 2024-02-09 VITALS — BP 97/57 | HR 78 | Temp 98.2°F | Ht <= 58 in

## 2024-02-09 DIAGNOSIS — N898 Other specified noninflammatory disorders of vagina: Secondary | ICD-10-CM

## 2024-02-09 NOTE — Patient Instructions (Signed)
 Return based on lab results or if symptoms worsen or fail to improve.

## 2024-02-09 NOTE — Progress Notes (Signed)
   Established Patient Office Visit  Subjective   Patient ID: Leslie Roberson, female    DOB: [birth year]  Age: 68 y.o. MRN: [id number]  Chief Complaint  Patient presents with   Vaginal Discharge    Nurse visit for wet prep collection    HPI  Vaginal discharge and vaginal itching - Nurse visit  for wet prep collection. Patient c/o yellowish thick vaginal discharge with itching that has improved today - symptoms began about 2 days ago per patient.    ROS    Objective:     BP (!) 97/57 Comment: patient refused second reading check - states this is a normal BP for her.  Pulse 78   Temp 98.2 F (36.8 C)   Ht 4' 10 (1.473 m)   SpO2 96%   BMI 25.50 kg/m    Physical Exam   No results found for any visits on 02/09/24.    The ASCVD Risk score (Arnett DK, et al., 2019) failed to calculate for the following reasons:   Risk score cannot be calculated because patient has a medical history suggesting prior/existing ASCVD    Assessment & Plan:  Wet prep collection- nurse visit. Wet prep collected and placed for send out to [facility] for processing.  Problem List Items Addressed This Visit   None Visit Diagnoses       Vaginal discharge    -  Primary   Relevant Orders   WET PREP FOR TRICH, YEAST, CLUE       Return if symptoms worsen or fail to improve.    Suzen SHAUNNA Plenty, LPN

## 2024-02-10 ENCOUNTER — Ambulatory Visit: Payer: Self-pay | Admitting: Physician Assistant

## 2024-02-10 LAB — WET PREP FOR TRICH, YEAST, CLUE
Clue Cell Exam: NEGATIVE
Trichomonas Exam: NEGATIVE
Yeast Exam: NEGATIVE

## 2024-02-10 NOTE — Progress Notes (Signed)
 No yeast/trich/BV. Are you still having symptoms?

## 2024-02-12 NOTE — Telephone Encounter (Signed)
 Copied from CRM #[id number]. Topic: Clinical - Lab/Test Results >> Feb 12, 2024  1:16 PM Joesph B wrote: Reason for CRM: patient is calling for results. Relayed. Would like to let her provider know she's no longer having symptoms. Patient is requesting a call from kim.

## 2024-02-12 NOTE — Telephone Encounter (Signed)
 Patient advised of results and recommendations. She reports no symptoms.

## 2024-02-15 DIAGNOSIS — M5416 Radiculopathy, lumbar region: Secondary | ICD-10-CM | POA: Diagnosis not present

## 2024-02-17 DIAGNOSIS — M4125 Other idiopathic scoliosis, thoracolumbar region: Secondary | ICD-10-CM | POA: Diagnosis not present

## 2024-02-17 DIAGNOSIS — M5459 Other low back pain: Secondary | ICD-10-CM | POA: Diagnosis not present

## 2024-02-17 DIAGNOSIS — M549 Dorsalgia, unspecified: Secondary | ICD-10-CM | POA: Diagnosis not present

## 2024-02-18 ENCOUNTER — Telehealth: Payer: Self-pay

## 2024-02-18 NOTE — Telephone Encounter (Signed)
   Pre-operative Risk Assessment    Patient Name: Leslie Roberson  DOB: 09-25-1955 MRN: [id number]   Date of last office visit: 01/19/2024, Orren Fabry, PA-C Date of next office visit: NONE   Request for Surgical Clearance    Procedure:  Endoscopicmedial branch transection L4, L5 & L5 dorsal rami on the right  Date of Surgery:  Clearance 03/10/24                                Surgeon: Dr. ORLINDA Blush Surgeon's Group or Practice Name: [facility] Phone number: [phone] Fax number: [phone]   Type of Clearance Requested:   - Medical  - Pharmacy:  Hold Aspirin      Type of Anesthesia:  Not Indicated   Additional requests/questions:    SignedAsberry KANDICE Dunning   02/18/2024, 2:29 PM

## 2024-02-18 NOTE — Telephone Encounter (Signed)
   Patient Name: Leslie Roberson  DOB: 08-23-1955 MRN: [id number]  Primary Cardiologist: Redell Shallow, MD  Chart reviewed as part of pre-operative protocol coverage. Pre-op clearance already addressed by colleagues in earlier phone notes. To summarize recommendations:  - I saw this patient last month and she was having more shortness of breath than usual.  Will order an echocardiogram which is scheduled for tomorrow 02/19/2024.  I would recommend that she undergo this echo prior to clearance being provided for her upcoming procedure.  If echocardiogram does not show any new findings, she can hold aspirin  x 5 to 7 days prior to procedure and resume when medically safe to do so.  Remote history of PCI back in 2019.  I will keep in the pool until echocardiogram results are obtained and then send final recommendations over via fax at that time.  Orren LOISE Fabry, PA-C 02/18/2024, 2:53 PM

## 2024-02-19 ENCOUNTER — Ambulatory Visit (HOSPITAL_COMMUNITY)
Admission: RE | Admit: 2024-02-19 | Discharge: 2024-02-19 | Disposition: A | Discharge status: Home / Self Care | Source: Ambulatory Visit | Attending: Physician Assistant | Admitting: Physician Assistant

## 2024-02-19 ENCOUNTER — Ambulatory Visit: Payer: Self-pay | Admitting: Cardiology

## 2024-02-19 ENCOUNTER — Ambulatory Visit (HOSPITAL_COMMUNITY): Admission: RE | Admit: 2024-02-19 | Source: Ambulatory Visit

## 2024-02-19 DIAGNOSIS — R0609 Other forms of dyspnea: Secondary | ICD-10-CM | POA: Diagnosis not present

## 2024-02-19 DIAGNOSIS — I25110 Atherosclerotic heart disease of native coronary artery with unstable angina pectoris: Secondary | ICD-10-CM | POA: Diagnosis not present

## 2024-02-19 DIAGNOSIS — I25118 Atherosclerotic heart disease of native coronary artery with other forms of angina pectoris: Secondary | ICD-10-CM | POA: Diagnosis not present

## 2024-02-19 DIAGNOSIS — E785 Hyperlipidemia, unspecified: Secondary | ICD-10-CM | POA: Insufficient documentation

## 2024-02-19 LAB — ECHOCARDIOGRAM COMPLETE
Area-P 1/2: 2.36 cm2
S' Lateral: 1.9 cm

## 2024-02-22 ENCOUNTER — Telehealth: Payer: Self-pay

## 2024-02-22 ENCOUNTER — Other Ambulatory Visit (HOSPITAL_COMMUNITY): Payer: Self-pay

## 2024-02-22 ENCOUNTER — Other Ambulatory Visit: Payer: Self-pay

## 2024-02-22 ENCOUNTER — Other Ambulatory Visit: Payer: Self-pay | Admitting: Physician Assistant

## 2024-02-22 DIAGNOSIS — M810 Age-related osteoporosis without current pathological fracture: Secondary | ICD-10-CM

## 2024-02-22 MED ORDER — PROLIA 60 MG/ML ~~LOC~~ SOSY
60.0000 mg | PREFILLED_SYRINGE | Freq: Once | SUBCUTANEOUS | 0 refills | Status: AC
  Filled 2024-02-22 (×2): qty 1, 1d supply, fill #0

## 2024-02-22 NOTE — Telephone Encounter (Signed)
 Pt ready for scheduling for PROLIA  on or after : 03/03/24  Option# 1: Buy/Bill (Office supplied medication)  Out-of-pocket cost due at time of clinic visit: $332  Number of injection/visits approved: ---  Primary: [employer] Prolia  co-insurance: 20% Admin fee co-insurance: 0%  Secondary: --- Prolia  co-insurance:  Admin fee co-insurance:   Medical Benefit Details: Date Benefits were checked: 02/22/24 Deductible: NO/ Coinsurance: 20%/ Admin Fee: 0%  Prior Auth: N/A PA# Expiration Date:   # of doses approved: ----------------------------------------------------------------------- Option# 2- Med Obtained from pharmacy: Prolia  is no longer preferred for pharmacy benefit. Jubbonti is now preferred. PRICING IS FOR JUBBONTI  Pharmacy benefit: Copay $0 (Paid to pharmacy) Admin Fee: 0% (Pay at clinic)  Prior Auth: N/A PA# Expiration Date:   # of doses approved:   If patient wants fill through the pharmacy benefit please send prescription to: [facility], and include estimated need by date in rx notes. Pharmacy will ship medication directly to the office.  Patient NOT eligible for Prolia  Copay Card. Copay Card can make patient's cost as little as $25. Link to apply: https://www.amgensupportplus.com/copay  ** This summary of benefits is an estimation of the patient's out-of-pocket cost. Exact cost may very based on individual plan coverage.

## 2024-02-22 NOTE — Telephone Encounter (Signed)
 A CAM order was placed at their last injection and the anticipated future date is 03/06/24 on the order.

## 2024-02-22 NOTE — Telephone Encounter (Signed)
 A prior authorization for Prolia  is required. The patient has requested to get the rx from [facility]. The patient is scheduled for the Prolia  injection on 03/04/24. Thanks in advance.

## 2024-02-22 NOTE — Telephone Encounter (Signed)
 Requesting office inquiring about clearance. Will send to preop APP to review notes from Dr. Pietro.

## 2024-02-22 NOTE — Progress Notes (Signed)
 Benefits Investigation Started  Reason: Product Not on Formulary  Routed to: [facility]

## 2024-02-22 NOTE — Telephone Encounter (Signed)
 Prolia  BIV in separate encounter. Will route encounter back once benefit verification is complete.    Please follow CAD Authorization workflow and place a CAM order to initiate Prolia  Benefits investigation. Standard turnaround time is 2 weeks.

## 2024-02-22 NOTE — Telephone Encounter (Signed)
 Prolia VOB initiated via [email or web address]  Next Prolia inj DUE: 04/24/23

## 2024-02-23 ENCOUNTER — Other Ambulatory Visit: Payer: Self-pay

## 2024-02-23 ENCOUNTER — Telehealth: Payer: Self-pay

## 2024-02-23 NOTE — Telephone Encounter (Signed)
   Patient Name: Leslie Roberson  DOB: [birth year] MRN: [id number]  Primary Cardiologist: Redell Shallow, MD  Chart reviewed as part of pre-operative protocol coverage.  Was last seen in clinic by Orren Fabry, PA on 01/19/2024 and reported increased shortness of breath.  Repeat echocardiogram was ordered.  It was noted if echocardiogram does not show any new findings, she can hold aspirin  x 5 to 7 days prior to procedure and resume when medically safe to do so.  Remote history of PCI back in 2019.  Recent echocardiogram showed normal LV function.  Per Dr. Shallow, no change in medication at this time.  Therefore, given past medical history and time since last visit, based on ACC/AHA guidelines, Leslie Roberson is at acceptable risk for the planned procedure without further cardiovascular testing.   Per office protocol, she may hold Aspirin  for 5-7 days prior to procedure. Please resume Aspirin  as soon as possible postprocedure, at the discretion of the surgeon.   I will route this recommendation to the requesting party via Epic fax function and remove from pre-op pool.  Please call with questions.  Damien JAYSON Braver, NP 02/23/2024, 8:18 AM

## 2024-02-23 NOTE — Telephone Encounter (Signed)
 Patient's insurance no longer covers Prolia . Plan prefers Jubbonti. Copay is $0. Please send a new prescription to [facility]. Thanks!

## 2024-02-24 ENCOUNTER — Telehealth: Payer: Self-pay | Admitting: Physician Assistant

## 2024-02-24 ENCOUNTER — Other Ambulatory Visit: Payer: Self-pay

## 2024-02-24 DIAGNOSIS — M810 Age-related osteoporosis without current pathological fracture: Secondary | ICD-10-CM

## 2024-02-24 NOTE — Telephone Encounter (Signed)
 Copied from CRM [id number]. Topic: Clinical - Medication Refill >> Feb 24, 2024  8:59 AM Aleatha C wrote: Medication: Jubbonti.  Has the patient contacted their pharmacy? Yes (Agent: If no, request that the patient contact the pharmacy for the refill. If patient does not wish to contact the pharmacy document the reason why and proceed with request.) (Agent: If yes, when and what did the pharmacy advise?)  This is the patient's preferred pharmacy:  [facility]  [address] [place], [place], [place] [address]  Is this the correct pharmacy for this prescription? Yes If no, delete pharmacy and type the correct one.   Has the prescription been filled recently? No  Is the patient out of the medication? Yes  Has the patient been seen for an appointment in the last year OR does the patient have an upcoming appointment? Yes  Can we respond through MyChart? No  Agent: Please be advised that Rx refills may take up to 3 business days. We ask that you follow-up with your pharmacy.

## 2024-02-24 NOTE — Telephone Encounter (Signed)
 Unable to pend

## 2024-02-26 ENCOUNTER — Other Ambulatory Visit: Payer: Self-pay

## 2024-02-29 ENCOUNTER — Other Ambulatory Visit: Payer: Self-pay

## 2024-02-29 ENCOUNTER — Other Ambulatory Visit (HOSPITAL_COMMUNITY): Payer: Self-pay

## 2024-02-29 DIAGNOSIS — M810 Age-related osteoporosis without current pathological fracture: Secondary | ICD-10-CM

## 2024-02-29 MED ORDER — DENOSUMAB-BBDZ 60 MG/ML ~~LOC~~ SOSY
60.0000 mg | PREFILLED_SYRINGE | Freq: Once | SUBCUTANEOUS | 0 refills | Status: AC
  Filled 2024-02-29: qty 1, 180d supply, fill #0
  Filled 2024-02-29: qty 1, 1d supply, fill #0

## 2024-02-29 MED ORDER — DENOSUMAB-BBDZ 60 MG/ML ~~LOC~~ SOSY
60.0000 mg | PREFILLED_SYRINGE | Freq: Once | SUBCUTANEOUS | 0 refills | Status: AC
  Filled 2024-02-29: qty 1, 180d supply, fill #0

## 2024-02-29 NOTE — Telephone Encounter (Signed)
 Re-ordered by Luke Plenty, LPN today to [facility] pharmacy

## 2024-02-29 NOTE — Telephone Encounter (Signed)
 Jubbonti order placed to [facility]

## 2024-02-29 NOTE — Telephone Encounter (Signed)
 LM for pt to come and have her bmp checked and that the jubbonti has been ordered

## 2024-02-29 NOTE — Telephone Encounter (Signed)
 Orders for Jubbonti placed in chart  today by Annabella Rigg.

## 2024-02-29 NOTE — Progress Notes (Signed)
 Specialty Pharmacy Refill Coordination Note  Meiling Hendriks Roudebush is a 68 y.o. female contacted today regarding refills of specialty medication(s) Denosumab -bernett BARTON)   Patient requested Courier to Provider Office   Delivery date: 03/02/24   Verified address: [facility] [address]   Medication will be filled on: 03/01/24

## 2024-03-01 ENCOUNTER — Other Ambulatory Visit: Payer: Self-pay

## 2024-03-01 ENCOUNTER — Ambulatory Visit: Payer: Self-pay | Admitting: Cardiology

## 2024-03-01 ENCOUNTER — Telehealth: Payer: Self-pay | Admitting: Cardiology

## 2024-03-01 LAB — BMP8+EGFR
BUN/Creatinine Ratio: 17 (ref 12–28)
BUN: 18 mg/dL (ref 8–27)
CO2: 22 mmol/L (ref 20–29)
Calcium: 10.3 mg/dL (ref 8.7–10.3)
Chloride: 101 mmol/L (ref 96–106)
Creatinine, Ser: 1.03 mg/dL — ABNORMAL HIGH (ref 0.57–1.00)
Glucose: 152 mg/dL — ABNORMAL HIGH (ref 70–99)
Potassium: 4.1 mmol/L (ref 3.5–5.2)
Sodium: 144 mmol/L (ref 134–144)
eGFR: 59 mL/min/1.73 — ABNORMAL LOW (ref >59)

## 2024-03-01 NOTE — Telephone Encounter (Signed)
 Joeann with [facility] is requesting for us  to seen a copy of recent TEE report. Joeann requested we fax it to [phone] with attention to [facility]. Please advise.

## 2024-03-02 ENCOUNTER — Other Ambulatory Visit (HOSPITAL_COMMUNITY): Payer: Self-pay

## 2024-03-02 ENCOUNTER — Telehealth: Payer: Self-pay

## 2024-03-02 DIAGNOSIS — J45909 Unspecified asthma, uncomplicated: Secondary | ICD-10-CM | POA: Diagnosis not present

## 2024-03-02 DIAGNOSIS — G4733 Obstructive sleep apnea (adult) (pediatric): Secondary | ICD-10-CM | POA: Diagnosis not present

## 2024-03-02 DIAGNOSIS — E119 Type 2 diabetes mellitus without complications: Secondary | ICD-10-CM | POA: Diagnosis not present

## 2024-03-02 DIAGNOSIS — Z01818 Encounter for other preprocedural examination: Secondary | ICD-10-CM | POA: Diagnosis not present

## 2024-03-02 DIAGNOSIS — I10 Essential (primary) hypertension: Secondary | ICD-10-CM | POA: Diagnosis not present

## 2024-03-02 DIAGNOSIS — E039 Hypothyroidism, unspecified: Secondary | ICD-10-CM | POA: Diagnosis not present

## 2024-03-02 DIAGNOSIS — I2510 Atherosclerotic heart disease of native coronary artery without angina pectoris: Secondary | ICD-10-CM | POA: Diagnosis not present

## 2024-03-02 DIAGNOSIS — E785 Hyperlipidemia, unspecified: Secondary | ICD-10-CM | POA: Diagnosis not present

## 2024-03-02 MED ORDER — DENOSUMAB-BBDZ 60 MG/ML ~~LOC~~ SOSY
60.0000 mg | PREFILLED_SYRINGE | Freq: Once | SUBCUTANEOUS | Status: AC
  Administered 2024-03-04: 60 mg via SUBCUTANEOUS

## 2024-03-02 NOTE — Telephone Encounter (Signed)
 Received Jubonnti from pharmacy - placed in refrigerator for patient to have administerd at upcoming nurse visit scheduled for 03/04/2024.

## 2024-03-02 NOTE — Addendum Note (Signed)
 Addended by: Rainey Rodger L on: 03/02/2024 11:54 AM   Modules accepted: Orders

## 2024-03-04 ENCOUNTER — Ambulatory Visit

## 2024-03-04 ENCOUNTER — Other Ambulatory Visit (HOSPITAL_COMMUNITY): Payer: Self-pay

## 2024-03-04 ENCOUNTER — Other Ambulatory Visit: Payer: Self-pay

## 2024-03-04 VITALS — BP 95/53 | HR 76 | Ht <= 58 in

## 2024-03-04 DIAGNOSIS — M810 Age-related osteoporosis without current pathological fracture: Secondary | ICD-10-CM

## 2024-03-04 DIAGNOSIS — R0789 Other chest pain: Secondary | ICD-10-CM

## 2024-03-04 DIAGNOSIS — J4520 Mild intermittent asthma, uncomplicated: Secondary | ICD-10-CM

## 2024-03-04 MED ORDER — DENOSUMAB-BBDZ 60 MG/ML ~~LOC~~ SOSY: 60.0000 mg | PREFILLED_SYRINGE | Freq: Once | SUBCUTANEOUS | Status: AC

## 2024-03-04 MED ORDER — ALBUTEROL SULFATE HFA 108 (90 BASE) MCG/ACT IN AERS: 1.0000 | INHALATION_SPRAY | RESPIRATORY_TRACT | 2 refills | Status: AC | PRN

## 2024-03-04 NOTE — Telephone Encounter (Signed)
 Requesting rx rf of Alutrol HFA Lat written 01/19/2023 Last OV 09/02/225 Upcoming appt 03/15/2024

## 2024-03-04 NOTE — Progress Notes (Signed)
   Established Patient Office Visit  Subjective   Patient ID: Leslie Roberson, female    DOB: 04-04-1955  Age: 68 y.o. MRN: [id number]  Chief Complaint  Patient presents with   age related osteopororosis without current pathological fra    Jubbonti  admin as nurse visit.     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  Patients last injection  was givne 09/02/2023/ the lab work from 02/29/2024 was reviewd by Leslie Roberson and approval was given for injection today.  Patient stated at check in that she was having a spinal surgery scheduled for Thrusday 03/10/2024 ( found to be endoscipic  Medial Branch Transsection L4, L5 dorsal rami on the right )  Dr. Alvan was consulted and verbal approval was given to proceed with injection  by both Margarito Bologna, PA  and Dr. Lynnie .  ROS    Objective:     BP (!) 95/53   Pulse 76   Ht 4' 10 (1.473 m)   SpO2 99%   BMI 25.50 kg/m    Physical Exam   No results found for any visits on 03/04/24.    The ASCVD Risk score (Arnett DK, et al., 2019) failed to calculate for the following reasons:   Risk score cannot be calculated because patient has a medical history suggesting prior/existing ASCVD    Assessment & Plan:  Admin Jubbonti - SQ right arm. Patient tolerated injection well without complications. Patient will return in 6 months and one day for next injection of Jubbonti .called Dr. Ethel blush ,MD office  and left a message on nurse Tanisha line  that patient has had Jubbonti  injection today and that Leslie Bologna, PA had recommended recheck of calcium  before her upcoming surgery.  Problem List Items Addressed This Visit       Musculoskeletal and Integument   Osteoporosis - Primary   Relevant Medications   denosumab -bbdz (JUBBONTI ) injection 60 mg (Start on 09/03/2024 12:00 AM)    No follow-ups on file.    Suzen SHAUNNA Plenty, LPN

## 2024-03-04 NOTE — Patient Instructions (Signed)
 Return in 6 months and one day for next Jubbonti  injection as nurse visit.

## 2024-03-07 DIAGNOSIS — M5416 Radiculopathy, lumbar region: Secondary | ICD-10-CM | POA: Diagnosis not present

## 2024-03-07 DIAGNOSIS — M479 Spondylosis, unspecified: Secondary | ICD-10-CM | POA: Diagnosis not present

## 2024-03-08 DIAGNOSIS — Z79620 Long term (current) use of immunosuppressive biologic: Secondary | ICD-10-CM | POA: Diagnosis not present

## 2024-03-08 DIAGNOSIS — Z5181 Encounter for therapeutic drug level monitoring: Secondary | ICD-10-CM | POA: Diagnosis not present

## 2024-03-08 DIAGNOSIS — M810 Age-related osteoporosis without current pathological fracture: Secondary | ICD-10-CM | POA: Diagnosis not present

## 2024-03-08 DIAGNOSIS — Z7189 Other specified counseling: Secondary | ICD-10-CM | POA: Diagnosis not present

## 2024-03-10 DIAGNOSIS — M549 Dorsalgia, unspecified: Secondary | ICD-10-CM | POA: Diagnosis not present

## 2024-03-10 DIAGNOSIS — M5459 Other low back pain: Secondary | ICD-10-CM | POA: Diagnosis not present

## 2024-03-10 DIAGNOSIS — Z9889 Other specified postprocedural states: Secondary | ICD-10-CM | POA: Insufficient documentation

## 2024-03-15 ENCOUNTER — Ambulatory Visit: Admitting: Physician Assistant

## 2024-03-15 ENCOUNTER — Encounter: Payer: Self-pay | Admitting: Physician Assistant

## 2024-03-15 VITALS — BP 110/65 | HR 67 | Ht <= 58 in | Wt 124.0 lb

## 2024-03-15 DIAGNOSIS — E1122 Type 2 diabetes mellitus with diabetic chronic kidney disease: Secondary | ICD-10-CM | POA: Diagnosis not present

## 2024-03-15 DIAGNOSIS — M5416 Radiculopathy, lumbar region: Secondary | ICD-10-CM

## 2024-03-15 DIAGNOSIS — T8189XA Other complications of procedures, not elsewhere classified, initial encounter: Secondary | ICD-10-CM | POA: Insufficient documentation

## 2024-03-15 DIAGNOSIS — Z7984 Long term (current) use of oral hypoglycemic drugs: Secondary | ICD-10-CM

## 2024-03-15 DIAGNOSIS — N183 Chronic kidney disease, stage 3 unspecified: Secondary | ICD-10-CM | POA: Diagnosis not present

## 2024-03-15 LAB — POCT GLYCOSYLATED HEMOGLOBIN (HGB A1C): Hemoglobin A1C: 6.8 % — AB (ref 4.0–5.6)

## 2024-03-15 LAB — POCT UA - MICROALBUMIN
Albumin/Creatinine Ratio, Urine, POC: <30
Creatinine, POC: 200 mg/dL
Microalbumin Ur, POC: 30 mg/L

## 2024-03-15 MED ORDER — SYNJARDY XR 25-1000 MG PO TB24: 1.0000 | ORAL_TABLET | Freq: Every day | ORAL | 1 refills | Status: AC

## 2024-03-15 NOTE — Progress Notes (Unsigned)
° °  Established Patient Office Visit  Subjective   Patient ID: Leslie Roberson, female    DOB: 04/17/1955  Age: 68 y.o. MRN: [id number]    HPI   ROS    Objective:     There were no vitals taken for this visit. {Vitals History (Optional):23777}  Physical Exam   No results found for any visits on 03/15/24.  {Labs (Optional):23779}  The ASCVD Risk score (Arnett DK, et al., 2019) failed to calculate for the following reasons:   Risk score cannot be calculated because patient has a medical history suggesting prior/existing ASCVD   * - Cholesterol units were assumed    Assessment & Plan:   Problem List Items Addressed This Visit   None Visit Diagnoses       Controlled type 2 diabetes mellitus with stage 3 chronic kidney disease, without long-term current use of insulin (HCC)    -  Primary       No follow-ups on file.    Kristoff Coonradt, PA-C

## 2024-03-15 NOTE — Patient Instructions (Signed)
 Use ice pack for itching.   Diabetes: Healthy Eating for Adults When you have diabetes, also called diabetes mellitus, it's important to have healthy eating habits. Your blood sugar (glucose) levels are greatly affected by what you eat and drink. You need to eat healthy foods in the right amounts, at about the same times each day. Doing this can help you: Manage your blood sugar. Lower your risk of heart disease. Improve your blood pressure. Reach or stay at a healthy weight. What can affect my meal plan? Every person with diabetes is different. And each person has different needs for a meal plan. Your health care provider may suggest that you work with an expert in healthy eating called a dietitian. They can help you make a meal plan that's best for you. How do carbohydrates affect me? Carbohydrates, also called carbs, affect your blood sugar level more than any other type of food. Eating carbs raises the amount of sugar in your blood. It's important to know how many carbs you can safely have in each meal. This is different for every person. Your dietitian can help you calculate how many carbs you should have at each meal and for each snack. How does alcohol affect me? Alcohol can cause a decrease in blood sugar (hypoglycemia), especially if you use insulin or take certain diabetes medicines by mouth. Hypoglycemia can be a life-threatening condition. Symptoms of hypoglycemia are similar to those of having too much alcohol. They include confusion, being sleepy, and feeling dizzy. Do not drink alcohol if: Your provider tells you not to drink. You're pregnant, may be pregnant, or plan to become pregnant. What are tips for following this plan? Reading food labels Start by checking the serving size on the Nutrition Facts label of packaged foods and drinks. The number of calories and the amount of carbs, fats, and other nutrients listed on the label are based on one serving of the item. Many items  contain more than one serving per package. Check the total grams (g) of carbs in one serving. Check the number of grams of saturated fats and trans fats in one serving. Choose foods that have a low amount or none of these fats. Check the number of milligrams (mg) of salt (sodium) in one serving. Most people should limit their total sodium intake to less than 2,300 mg per day. Always check the nutrition information of foods labeled as low-fat or nonfat. These foods may be higher in added sugar or refined carbs and should be avoided. Talk to your dietitian to identify your daily goals for nutrients listed on the label. Shopping Avoid buying canned, pre-made, or processed foods. These foods tend to be high in fat, sodium, and added sugar. Shop around the outside edge of the grocery store. This is where you'll most often find fresh fruits and vegetables, bulk grains, fresh meats, and fresh dairy products. Cooking Use low-heat cooking methods, such as baking, instead of high-heat methods like deep frying. Cook using healthy oils, such as olive, canola, or sunflower oil. Avoid cooking with butter, cream, or high-fat meats. Meal planning  Eat meals and snacks regularly. Try to eat them at the same times every day. Avoid going too long without eating. Eat foods that are high in fiber, such as fresh fruits, vegetables, beans, and whole grains. Eat 4-6 oz (112-168 g) of lean protein each day, such as lean meat, chicken, fish, eggs, or tofu. One ounce (oz) (28 g) of lean protein is equal to: 1 oz (  28 g) of meat, chicken, or fish. 1 egg.  cup (62 g) of tofu. Eat some foods each day that contain healthy fats, such as avocado, nuts, seeds, and fish. What foods should I eat? Fruits Berries. Apples. Oranges. Peaches. Apricots. Plums. Grapes. Mangoes. Papayas. Pomegranates. Kiwi. Cherries. Vegetables Leafy greens, including lettuce, spinach, kale, chard, collard greens, mustard greens, and cabbage.  Beets. Cauliflower. Broccoli. Carrots. Green beans. Tomatoes. Peppers. Onions. Cucumbers. Brussels sprouts. Grains Whole grains, such as whole-wheat or whole-grain bread, crackers, tortillas, cereal, and pasta. Unsweetened oatmeal. Quinoa. Brown or wild rice. Meats and other proteins Seafood. Poultry without skin. Lean cuts of poultry and beef. Tofu. Nuts. Seeds. Dairy Low-fat or fat-free dairy products such as milk, yogurt, and cheese. The items listed above may not be all the foods and drinks you can have. Talk with a dietitian to learn more. What foods should I avoid? Fruits Fruits canned with syrup. Vegetables Canned vegetables. Frozen vegetables with butter or cream sauce. Grains Refined white flour and flour products such as bread, pasta, snack foods, and cereals. Avoid all processed foods. Meats and other proteins Fatty cuts of meat. Poultry with skin. Breaded or fried meats. Processed meat. Avoid saturated fats. Dairy Full-fat yogurt, cheese, or milk. Beverages Sweetened drinks, such as soda or iced tea. The items listed above may not be all the foods and drinks you should avoid. Talk with a dietitian to learn more. Where to find more information: To learn more, go to: Academy of Nutrition and Dietetics at deathprevention.it. Click Search and type diabetes. Find the link you need. Centers for Disease Control and Prevention at tonerpromos.no. Click Search and type diabetes. Find the link you need. American Diabetes Association: diabetes.org/food-nutrition [employer] of Diabetes and Digestive and Kidney Diseases: stagesync.si This information is not intended to replace advice given to you by your health care provider. Make sure you discuss any questions you have with your health care provider. Document Revised: 03/05/2023 Document Reviewed: 03/05/2023 Elsevier Patient Education  2025 [employer].

## 2024-03-18 ENCOUNTER — Encounter: Payer: Self-pay | Admitting: Physician Assistant

## 2024-03-18 DIAGNOSIS — M5416 Radiculopathy, lumbar region: Secondary | ICD-10-CM | POA: Insufficient documentation

## 2024-03-18 NOTE — ED Provider Notes (Signed)
 ------------------------------------------------------------------------------- Attestation signed by Mariel Davidson, MD at 03/19/24 [phone] PATIENT EVALUATED IN CONJUNCTION WITH THE MID LEVEL PROVIDER. ALTHOUGH I DID NOT PERSONALLY SEE OR EXAMINE THE PATIENT, I WAS AVAILABLE FOR CONSULTATION AT THE TIME OF THEIR EMERGENCY DEPT VISIT.  I AGREE WITH THE MLP's DOCUMENTATION, INTERPRETATION OF DIAGNOSTIC TESTS AND DISPOSITION.   [name], MD 8:07 AM    -------------------------------------------------------------------------------   [facility]  ED Provider Note  Leslie Roberson 68 y.o. female DOB: 03-Jan-1956 MRN: [id number] History   Chief Complaint  Patient presents with   Constipation    States her last BM was 4 days. States she's been taking pain meds after back surgery. Hx of constipation. Taking colace and stool softener at home with no success.    68 year old female with a history of diabetes presented to the ER for evaluation of abdominal pain and constipation.  Patient is accompanied by her husband, history provided by patient.  Patient tells me that she is 9 days status post endoscopic medial branch transection.  Tells me that she has been taking pain medication since her surgery.  States that she has also been taking Dulcolax.  States that she has not had a bowel movement in 4 days.  Tells me that she had attempted to take a stool softener and developed a sudden onset dull/crampy pain in her left lower abdomen this evening.  States it has been constant since onset and progressively worsening.  Also endorses associated rectal pain.  Endorses nausea without vomiting.  Tells me that she has not passed gas this evening.  Denies fevers, headaches, visual changes, chest pain, palpitations, difficulty breathing, dysuria, hematuria.       Past Medical History:  Diagnosis Date   Allergy    Anxiety    Arthritis    Asthma (*)     Blockage of coronary artery of heart (*)    CAD (coronary artery disease)    Cataracts, bilateral    Chronic back pain    Depression    Diabetes mellitus (*)    Disease of thyroid  gland    Diverticulitis of colon    Gastroesophageal reflux    History of blood transfusion    Hyperlipidemia    Hypertension    Hypothyroidism    Idiopathic scoliosis    Osteoporosis    Pneumothorax 2019   Sleep apnea    does not use CPAP    Past Surgical History:  Procedure Laterality Date   Abdominal hysterectomy     Bladder mesh     Cardiac surgery  06/2017   stents        LEFT HEART CATH AND CORONARY ANGIOGRAPHY   Colon surgery  2007   Colonoscopy  2017   Joint replacement  06/17/1996   Small intestine surgery  06/29/2004   Spinal fusion     Tonsillectomy     Total hip arthroplasty Right 07/30/2023   Procedure: ARTHROSCOPY HIP; Surgeon: Genelle Standing, MD; Location: [facility]    Social History   Substance and Sexual Activity  Alcohol Use Yes   Tobacco Use History[1] E-Cigarettes   Vaping Use Never User    Start Date     Cartridges/Day     Quit Date     Social History   Substance and Sexual Activity  Drug Use No         Allergies[2]  Home Medications   ALBUTEROL  SULFATE HFA (PROVENTIL ,VENTOLIN ,PROAIR ) 108 (90 BASE) MCG/ACT INHALER    Inhale  into the lungs.   ASPIRIN  (ASPRI-LOW,ECOTRIN LOW DOSE) 81 MG EC TABLET    Take one tablet (81 mg dose) by mouth.   ATORVASTATIN  (LIPITOR) 20 MG TABLET    Take one tablet (20 mg dose) by mouth daily.   BREXPIPRAZOLE (REXULTI) 1 MG TABLET    Take one tablet (1 mg dose) by mouth daily.   BUSPIRONE  (BUSPAR ) 15 MG TABLET    Take two tablets (30 mg dose) by mouth 2 (two) times daily.   CALCIUM  CARBONATE-VITAMIN D  (OYSTER SHELL CALCIUM /D) 500-5 MG-MCG TABS    Take 1 tablet by mouth daily.   CLONAZEPAM  (KLONOPIN ) 0.5 MG TABLET    Take one tablet (0.5 mg dose) by mouth at bedtime.   DENOSUMAB  (PROLIA ) 60  MG/ML    Inject sixty mg into the skin.   DESVENLAFAXINE  (PRISTIQ ) 100 MG 24 HR TABLET    TAKE ONE TABLET BY MOUTH DAILY   FLUTICASONE  PROPIONATE (FLONASE ) 50 MCG/ACTUATION NASAL SPRAY    two sprays by Both Nostrils route daily.   GABAPENTIN  (NEURONTIN ) 300 MG CAPSULE    TAKE 1 CAP AT BEDTIME X1 WEEK,1 CAP TWICE DAILY X1 WEEK THEN 1 CAP 3X DAILY MAY DOUBLE WEEKLY=3600MG    KETOCONAZOLE (NIZORAL) 2% SHAMPOO    Apply topically.   LEVOTHYROXINE  SODIUM (SYNTHROID ,LEVOTHROID,LEVOXYL ) 137 MCG TABLET    TAKE 1/2 TABLET DAILY 5 DAYS A WEEK AND 1 TABLET DAILY 2 DAYS A WEEK.   METHOCARBAMOL (ROBAXIN) 500 MG TABLET    Take one tablet (500 mg dose) by mouth 3 (three) times a day as needed (muscle spasms).   METHOCARBAMOL (ROBAXIN) 500 MG TABLET    Take one tablet (500 mg dose) by mouth at bedtime.   METOPROLOL  SUCCINATE (TOPROL -XL) 25 MG 24 HR TABLET    Take one tablet (25 mg dose) by mouth.   NITROGLYCERIN  (NITROSTAT ) 0.4 MG SL TABLET    SMARTSIG:Sublingual   OMEGA-3 FATTY ACIDS PO    Take by mouth.   PANTOPRAZOLE  SODIUM (PROTONIX ) 40 MG TABLET    Take one tablet (40 mg dose) by mouth daily.   SYNJARDY  XR 25-1000 MG TB24 TABLET    Take one tablet by mouth daily.   TRAZODONE  (DESYREL ) 50 MG TABLET    Take three tablets (150 mg dose) by mouth.    Primary Survey  Primary Survey  Review of Systems   Review of Systems  Constitutional:  Negative for activity change, appetite change, chills and fever.  HENT:  Negative for ear pain and sore throat.   Eyes:  Negative for pain and visual disturbance.  Respiratory:  Negative for cough, chest tightness and shortness of breath.   Cardiovascular:  Negative for chest pain, palpitations and leg swelling.  Gastrointestinal:  Positive for abdominal pain and constipation. Negative for abdominal distention, diarrhea, nausea and vomiting.  Genitourinary:  Negative for difficulty urinating, dysuria, flank pain and hematuria.  Musculoskeletal:  Negative for arthralgias,  back pain, myalgias, neck pain and neck stiffness.  Skin:  Negative for color change and rash.  Neurological:  Negative for seizures, syncope, weakness, light-headedness and headaches.  All other systems reviewed and are negative.   Physical Exam   ED Triage Vitals [03/18/24 2219]  BP 129/61  Heart Rate 81  Resp 19  SpO2 95 %  Temp 98.2 F (36.8 C)    Physical Exam  Nursing note and vitals reviewed. Constitutional: She appears well-developed and well-nourished. She does not appear distressed, does not appear ill and no respiratory distress.  HENT:  Head: Normocephalic and atraumatic.  Eyes: Conjunctivae are normal.  Neck: Neck supple.  Cardiovascular: Normal rate, regular rhythm, normal heart sounds and intact distal pulses.  Pulmonary/Chest: No respiratory distress. Respiratory effort normal and breath sounds normal.  Abdominal: Soft. There is mild generalized abdominal tenderness. There is no guarding and no rebound. Abdomen not distended. Bowel sounds are normal. There is no CVA tenderness.  Musculoskeletal:     Cervical back: Neck supple. no edema.  Neurological: She is alert and oriented to person, place, and time.  Skin: Skin is warm.  Psychiatric: She has a normal mood and affect. Her behavior is normal.     ED Course   Lab results:   CBC AND DIFFERENTIAL - Abnormal      Result Value   WBC 4.7     RBC 4.51     HGB 11.6     HCT 38.2     MCV 84.7     MCH 25.7     MCHC 30.4 (*)    Plt Ct 247     RDW SD 53.5 (*)    MPV 11.5     NRBC% 0.0     Absolute NRBC Count 0.00     NEUTROPHIL % 56.7     LYMPHOCYTE % 31.3     MONOCYTE % 8.9     Eosinophil % 2.1     BASOPHIL % 0.6     IG% 0.4     ABSOLUTE NEUTROPHIL COUNT 2.66     ABSOLUTE LYMPHOCYTE COUNT 1.47     Absolute Monocyte Count 0.42     Absolute Eosinophil Count 0.10     Absolute Basophil Count 0.03     Absolute Immature Granulocyte Count 0.02    COMPREHENSIVE METABOLIC PANEL - Abnormal   Na 139      Potassium 4.6     Cl 104     CO2 24     AGAP 11     Glucose 117 (*)    BUN 15     Creatinine 1.09 (*)    Ca 9.1     ALK PHOS 64     T Bili <0.2     Total Protein 6.3     Alb 3.8     GLOBULIN 2.5     ALBUMIN/GLOBULIN RATIO 1.5     BUN/CREAT RATIO 13.8     ALT 21     AST 21     eGFR 55 (*)    Comment: Normal GFR (glomerular filtration rate) > 60 mL/min/1.73 meters squared, < 60 may include impaired kidney function. Calculation based on the Chronic Kidney Disease Epidemiology Collaboration (CK-EPI)equation refit without adjustment for race.  LIPASE - Normal   Lipase 30      Imaging:   CT ABDOMEN PELVIS W IV CONTRAST   Narrative:    CT abdomen and pelvis with contrast:  INDICATION: Abdominal pain, constipation  TECHNIQUE: Axial scans were performed through the abdomen and pelvis with intravenous contrast. Contrast-60 mL Isovue -370. Radiation dose reduction was utilized (automated exposure control, mA or kV adjustment based on patient size, or iterative image reconstruction).  COMPARISON: 09/10/2018  FINDINGS:   #  Lung bases: Bibasilar atelectasis #  Liver: Normal enhancement. No focal lesions. #  Gallbladder: No radiopaque calculi #  Spleen: Normal. #  Pancreas: Normal #  Adrenal glands: Normal. #  Kidneys: Normal. #  Retroperitoneum: No mass or adenopathy. #  Aorta: No significant abnormality. Bowel and mesentery: There is no evidence of  small bowel obstruction.  The appendix is not identified.  The colon is poorly evaluated due to the lack of oral contrast and under distention. There are scattered diverticuli throughout the colon.  There is a large volume stool throughout the colon consistent with constipation.   Pelvis: #  No mass or adenopathy. #  No inflammatory changes seen. #  Bladder: No significant abnormality. #   Hysterectomy.  #    #  Bony structures: Degenerative changes of the lumbar spine. There is a dextroscoliosis. There is been prior  orthopedic fusion of the right SI joint with prior left hip arthroplasty.  #      Impression:    IMPRESSION:   Large amount stool throughout the colon consistent with constipation.  No evidence of bowel obstruction.      Electronically Signed by: Marinda Fleming, MD on 03/19/2024 1:13 AM     ECG: ECG Results   None                                                                        Pre-Sedation Procedures    Medical Decision Making Patient presented to the ER for evaluation of 4 days of constipation and 1 day of lower abdominal pain and cramping.  Is on narcotics after surgery 9 days ago.  On exam, she appears uncomfortable, but is in no acute distress.  She speaking full sentences without difficulty.  There is mild tenderness on palpation of the diffuse abdomen.  Mild distention.  Physical exam otherwise reassuring.  She is afebrile vital signs are stable.  Due to HPI and physical exam findings, concern for but not limited to, ileus, SBO, rectal impaction, constipation, electro abnormalities.  Patient's workup is overall reassuring consistent with drug-induced constipation confirmed on CT scan.  CBC negative for leukocytosis or acute anemias.  CMP consistent with mild dehydration.  Lipase is normal.  Patient given a soapsuds enema with improvement.  Given patient's reassuring workup and well-appearing nature, they are appropriate for outpatient management.  Discussed that constipation is likely opioid-induced.  Encouraged to use only as needed.  Encouraged to increase fiber and water in her diet.  Discussed the importance of supportive care and close follow-up.  Strict follow-up precautions were given.  Patient expressed understanding and all questions were answered.  Further instructions provided in the discharge summary.   Amount and/or Complexity of Data Reviewed Labs: ordered. Radiology: ordered.  Risk Prescription drug  management.          Provider Communication  New Prescriptions   No medications on file    Modified Medications   No medications on file    Discontinued Medications   No medications on file    Clinical Impression Final diagnoses:  Drug-induced constipation    ED Disposition     ED Disposition  Discharge   Condition  Stable   Comment  --                 Follow-up Information     Vermell LITTIE Bologna, PA. Schedule an appointment as soon as possible for a visit in 1 week.   Specialty: Physician Assistant Comments: As needed Contact information: [address] [place] [address] [phone]  [facility] Emergency Department.   Specialty: Emergency Medicine Comments: As needed Contact information: [address] [facility] [address] [place]  [address] [phone] Additional information: When you arrive, our care team of nurses or providers will be ready to help you. Pre-registering, while not a scheduled appointment, helps us  prepare for your visit, ensuring a smoother and faster evaluation process.                 Electronically signed by:       [1] Social History Tobacco Use  Smoking Status Never  Smokeless Tobacco Never  [2] Allergies Allergen Reactions   Brassica Oleracea Italica Nausea Only and Other    Causes stomach pain also   Contact Dermatitis Other   Hydromorphone Rash   Levofloxacin Other    hallucinations   Meperidine Nausea And Vomiting and Other    Doesn't work well for my pain.   Zaleplon Other    Trouble breathing   Raejonna L Pascarella, PA-C 03/19/24 0200

## 2024-03-21 ENCOUNTER — Encounter: Payer: Self-pay | Admitting: Family Medicine

## 2024-03-21 ENCOUNTER — Ambulatory Visit: Admitting: Family Medicine

## 2024-03-21 VITALS — BP 137/65 | HR 82 | Ht <= 58 in | Wt 126.0 lb

## 2024-03-21 DIAGNOSIS — K5903 Drug induced constipation: Secondary | ICD-10-CM | POA: Diagnosis not present

## 2024-03-21 NOTE — Progress Notes (Signed)
" ° °  Established Patient Office Visit  Patient ID: Leslie Roberson, female    DOB: 1955-10-17  Age: 68 y.o. MRN: [id number] PCP: Antoniette Vermell CROME, PA-C  Chief Complaint  Patient presents with   Hospitalization Follow-up    Pt reports that her BM today was extremely hard    Subjective:     HPI  Discussed the use of AI scribe software for clinical note transcription with the patient, who gave verbal consent to proceed.  History of Present Illness Leslie Roberson Leslie Roberson is a 68 year old female who presents with constipation following recent back surgery.  Constipation - Severe constipation onset following back surgery one and a half weeks ago - Bowel movements are hard and painful to pass - Current episode is more severe than prior history of constipation - Manual disimpaction and enema required for relief - Able to have a bowel movement today but continues to experience difficulty - Currently taking six stool softener capsules nightly - Colace and maximum dose over-the-counter stool softeners were ineffective - No longer taking pain medication  Abdominal symptoms - Bloating and abdominal tightness prior to onset of severe constipation - Able to pass gas, which provides some relief  Dietary limitations - History of diabetes limits ability to use high-sugar fruits for constipation management     ROS    Objective:     BP 137/65   Pulse 82   Ht 4' 10 (1.473 m)   Wt 126 lb 0.6 oz (57.2 kg)   SpO2 99%   BMI 26.34 kg/m    Physical Exam Vitals reviewed.  Constitutional:      Appearance: Normal appearance.  HENT:     Head: Normocephalic.  Pulmonary:     Effort: Pulmonary effort is normal.  Abdominal:     General: There is distension.     Palpations: Abdomen is soft. There is no mass.     Tenderness: There is no abdominal tenderness.  Neurological:     Mental Status: She is alert and oriented to person, place, and time.  Psychiatric:        Mood and  Affect: Mood normal.        Behavior: Behavior normal.      No results found for any visits on 03/21/24.    The ASCVD Risk score (Arnett DK, et al., 2019) failed to calculate for the following reasons:   Risk score cannot be calculated because patient has a medical history suggesting prior/existing ASCVD   * - Cholesterol units were assumed    Assessment & Plan:   Problem List Items Addressed This Visit   None Visit Diagnoses       Drug-induced constipation    -  Primary       Assessment and Plan Assessment & Plan Drug-induced constipation Likely secondary to recent back surgery and opioid use. Miralax recommended for its osmotic action to soften stools. - Initiate Miralax, one capful with six ounces of water, twice daily until stools are mushy. - Continue stool softeners, two capsules with each meal. - Increase dietary fiber intake with high-fiber vegetables and small amounts of apples and pears. - Consider prune juice in moderation, monitor blood sugar levels. - Encourage regular exercise as tolerated post-surgery. - Sent note to primary care provider, Vermell, for continuity of care.    Return if symptoms worsen or fail to improve.    Dorothyann Byars, MD [facility] at [facility]   "

## 2024-03-23 ENCOUNTER — Telehealth: Payer: Self-pay

## 2024-03-23 NOTE — Progress Notes (Signed)
" ° °  03/23/2024  Patient ID: Leslie Roberson, female   DOB: 02-19-1956, 68 y.o.   MRN: [id number]  This patient is appearing on a report for being at risk of failing the adherence measure for diabetes medications this calendar year.   Medication: Synjardy  XR 25/1000mg  Last fill date: 02/03/24 for 90 day supply  Insurance report was not up to date. No action needed at this time.   Channing DELENA Mealing, PharmD, DPLA   "

## 2024-04-06 ENCOUNTER — Other Ambulatory Visit: Payer: Self-pay | Admitting: Physician Assistant

## 2024-04-06 DIAGNOSIS — Z1231 Encounter for screening mammogram for malignant neoplasm of breast: Secondary | ICD-10-CM

## 2024-04-11 DIAGNOSIS — M47816 Spondylosis without myelopathy or radiculopathy, lumbar region: Secondary | ICD-10-CM | POA: Insufficient documentation

## 2024-04-17 ENCOUNTER — Ambulatory Visit
Admission: EM | Admit: 2024-04-17 | Discharge: 2024-04-17 | Disposition: A | Discharge status: Home / Self Care | Attending: Family Medicine | Admitting: Family Medicine

## 2024-04-17 DIAGNOSIS — N898 Other specified noninflammatory disorders of vagina: Secondary | ICD-10-CM | POA: Insufficient documentation

## 2024-04-17 MED ORDER — FLUCONAZOLE 200 MG PO TABS: ORAL_TABLET | ORAL | 0 refills | Status: AC

## 2024-04-17 NOTE — ED Provider Notes (Signed)
 " [facility] Rob[facility] C[facility]    CSN: [id number] Arrival date & time: 04/17/24  1435      History   Chief Complaint Chief Complaint  Patient presents with   Vaginal Itching    HPI Leslie Roberson is a 69 y.o. female.   HPI 69 year old female presents with vaginal itching.  PMH significant for CAD, chronic back pain, and overactive bladder.  Patient is accompanied by her husband today.  Past Medical History:  Diagnosis Date   Allergy 1992   Anxiety    Arthritis    right little finger; base of thumb left hand; spine (07/07/2017)   Asthma, chronic 11/27/2015   Cataract 2021   Chronic back pain    Coronary artery disease    4/19 PCI/DEx2 to LAD, and PCI/DES x1 to RCA, normal EF   Depression    Diabetes mellitus without complication (HCC) 2020   Diverticulitis of colon 12/21/2015   Colonoscopy every 5 years/digestive health.    GERD (gastroesophageal reflux disease) 11/27/2015   History of blood transfusion    related to spinal fusions   Hyperlipidemia LDL goal <70 11/29/2018   Hypertension    Hypothyroidism    Idiopathic scoliosis 01/22/2016   Osteoporosis 11/27/2015   On prolia .    Pneumonia 2016   Pneumothorax 06/26/2017   Pre-diabetes    Sleep apnea 2020   does not use CPAP   Thyroid  disease     Patient Active Problem List   Diagnosis Date Noted   Arthritis of facet joint of lumbar spine 04/11/2024   Lumbar radiculopathy 03/18/2024   Problem involving surgical incision 03/15/2024   S/P lumbar laminectomy 03/10/2024   Right hip impingement syndrome 07/30/2023   Toe blister without infection, left, initial encounter 06/02/2023   Routine health maintenance 09/08/2022   Need for antibiotic prophylaxis for dental procedure 08/05/2022   Sinobronchitis 07/29/2022   Community acquired pneumonia of left lower lobe of lung 12/11/2021   Closed dislocation of metacarpophalangeal joint of right thumb 11/18/2021   Mass of right axilla 05/15/2021   Ear fullness, left  04/08/2021   Bilateral hearing loss 04/05/2021   Controlled type 2 diabetes mellitus with stage 3 chronic kidney disease, without long-term current use of insulin (HCC) 11/16/2020   Paroxysmal nocturnal dyspnea 05/14/2020   Orthopnea 05/14/2020   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   Status post hip replacement, left 07/19/2019   Left hip pain 07/19/2019   OSA on CPAP 05/20/2019   Snoring 05/02/2019   Non-restorative sleep 05/02/2019   Migraine without aura and without status migrainosus, not intractable 02/08/2019   OAB (overactive bladder) 02/08/2019   Diabetes mellitus without complication (HCC) 02/07/2019   Dyslipidemia, goal LDL below 70 11/29/2018   DJD (degenerative joint disease), lumbosacral 11/29/2018   Pre-operative clearance 11/29/2018   Arthralgia 02/05/2018   Excessive sweating 09/22/2017   Tongue mass 08/14/2017   Angina, class III 07/07/2017   CAD S/P percutaneous coronary angioplasty    Pneumothorax 06/25/2017   Itchy scalp 05/24/2017   Right leg pain 02/06/2017   Chronic tension-type headache, intractable 02/05/2017   Prediabetes 02/05/2017   Dysphagia 01/27/2017   No energy 09/15/2016   Paresthesia 04/03/2016   Facet hypertrophy of lumbosacral region 04/03/2016   Chronic back pain 04/03/2016   Right knee pain 02/19/2016   Rosacea 02/04/2016   Vaginal atrophy 01/31/2016   Idiopathic scoliosis 01/22/2016   Hip bursitis 12/25/2015   Biceps tendonitis on right 12/25/2015   Diverticulitis of colon 12/21/2015  GERD (gastroesophageal reflux disease) 11/27/2015   Osteoporosis 11/27/2015   GAD (generalized anxiety disorder) 11/27/2015   MDD (major depressive disorder), recurrent, in full remission 11/27/2015   Chronic dryness of both eyes 11/27/2015   Acquired hypothyroidism 11/27/2015   Asthma, chronic 11/27/2015   Insomnia 11/27/2015   Seborrheic keratoses 11/27/2015   CAD in native artery 11/27/2015    Past Surgical History:  Procedure  Laterality Date   ABDOMINAL HYSTERECTOMY     bladder mesh     S/P bladder sling   COLON SURGERY  2007   CORONARY PRESSURE/FFR STUDY N/A 07/07/2017   Procedure: INTRAVASCULAR PRESSURE WIRE/FFR STUDY;  Surgeon: Anner Alm ORN, MD;  Location: [facility] INVASIVE CV LAB;  Service: Cardiovascular;  Laterality: N/A;   CORONARY STENT INTERVENTION N/A 07/07/2017   Procedure: CORONARY STENT INTERVENTION;  Surgeon: Anner Alm ORN, MD;  Location: [facility] INVASIVE CV LAB;  Service: Cardiovascular;  Laterality: N/A;   ELBOW SURGERY Right    dislocation   HIP ARTHROSCOPY Right 07/30/2023   Procedure: ARTHROSCOPY HIP;  Surgeon: Genelle Standing, MD;  Location: [facility];  Service: Orthopedics;  Laterality: Right;  RIGHT HIP ARTHROSCOPY WITH PINCER DEBRIDEMENT   INCONTINENCE SURGERY     didn't work   JOINT REPLACEMENT     LEFT HEART CATH AND CORONARY ANGIOGRAPHY N/A 07/07/2017   Procedure: LEFT HEART CATH AND CORONARY ANGIOGRAPHY;  Surgeon: Anner Alm ORN, MD;  Location: [facility] INVASIVE CV LAB;  Service: Cardiovascular;  Laterality: N/A;   SMALL INTESTINE SURGERY     SPINAL FUSION  ~ 1970 X 3   below neck - lower back   TONSILLECTOMY     TOTAL HIP ARTHROPLASTY Left     OB History   No obstetric history on file.      Home Medications    Prior to Admission medications  Medication Sig Start Date End Date Taking? Authorizing Provider  fluconazole  (DIFLUCAN ) 200 MG tablet Take 1 tab p.o. now, may repeat 1 tab p.o. in 3 days if symptoms are not resolved. 04/17/24  Yes Teddy Sharper, FNP  albuterol  (VENTOLIN  HFA) 108 (90 Base) MCG/ACT inhaler Inhale 1-2 puffs into the lungs every 4 (four) hours as needed for wheezing or shortness of breath. INHALE 1-2 PUFFS INTO THE LUNGS EVERY 4 HOURS AS NEEDED FOR WHEEZING OR SHORTNESS OF BREATH. 03/04/24   Breeback, Jade L, PA-C  aspirin  EC 81 MG tablet Take 81 mg by mouth daily. Swallow whole.    [provider]  atorvastatin  (LIPITOR) 20 MG  tablet Take 1 tablet (20 mg total) by mouth daily. 10/15/23   Pietro Redell RAMAN, MD  brexpiprazole (REXULTI) 1 MG TABS tablet Take 1 mg by mouth daily.    [provider]  busPIRone  (BUSPAR ) 15 MG tablet Take 60 mg by mouth daily.    [provider]  calcium -vitamin D  (OSCAL WITH D) 500-200 MG-UNIT tablet Take 1 tablet by mouth daily.    [provider]  clobetasol  (TEMOVATE ) 0.05 % external solution Apply 1 Application topically as needed. 05/22/17   [provider]  clonazePAM  (KLONOPIN ) 0.5 MG tablet Take 0.5 mg by mouth at bedtime. 12/08/20   [provider]  denosumab  (PROLIA ) 60 MG/ML SOLN injection Inject 60 mg into the skin every 6 (six) months. Administer in upper arm, thigh, or abdomen    [provider]  desvenlafaxine  (PRISTIQ ) 100 MG 24 hr tablet Take 100 mg by mouth in the morning.    [provider]  Empagliflozin-metFORMIN   HCl ER (SYNJARDY  XR) 25-1000 MG TB24 Take 1 tablet by mouth daily. 03/15/24   Breeback, Jade L, PA-C  fluticasone  (FLONASE ) 50 MCG/ACT nasal spray SPRAY 2 SPRAYS INTO EACH NOSTRIL EVERY DAY 01/21/23   Breeback, Jade L, PA-C  gabapentin  (NEURONTIN ) 300 MG capsule TAKE 1 CAPSULE IN THE MORNING AND 3 CAPSULES AT BEDTIME. Patient taking differently: TAKE 3 CAPSULES daily AT BEDTIME. 01/18/24   Breeback, Jade L, PA-C  ketoconazole (NIZORAL) 2 % shampoo Apply topically. 08/09/21   [provider]  levothyroxine  (SYNTHROID ) 137 MCG tablet TAKE 1 TABLET ONCE A WEEK, THEN 1/2 TABLET ALL OTHER DAYS AS DIRECTED 11/27/23   Breeback, Jade L, PA-C  metoprolol  succinate (TOPROL -XL) 25 MG 24 hr tablet TAKE 1 TABLET (25 MG TOTAL) BY MOUTH DAILY. 10/15/23   Pietro Redell RAMAN, MD  nitroGLYCERIN  (NITROSTAT ) 0.4 MG SL tablet DISSOLVE 1 TAB UNDER THE TONGUE FOR CHEST PAIN EVERY 5 MINUTES UP TO 3 DOSES IF PAIN PERSIST CALL 911/SEEK MEDICAL HELP 12/23/21   Daneen Damien BROCKS, NP  Omega-3 Fatty Acids (FISH OIL PO) Take 1 capsule  by mouth daily.     [provider]  pantoprazole  (PROTONIX ) 40 MG tablet TAKE 1 TABLET (40 MG TOTAL) BY MOUTH TWICE A DAY BEFORE MEALS 10/26/23   Breeback, Jade L, PA-C  trazodone  (DESYREL ) 300 MG tablet Take 300 mg by mouth at bedtime. 06/26/22   [provider]    Family History Family History  Problem Relation Age of Onset   Cancer Mother        lung   Depression Mother    Early death Mother    Heart attack Father    Hyperlipidemia Father    Hypertension Father    Arthritis Father    Heart disease Father    Diabetes Maternal Aunt    Hyperlipidemia Paternal Aunt    COPD Paternal Aunt    Hypertension Paternal Aunt    Stroke Paternal Aunt     Social History Social History[1]   Allergies   Hydromorphone, Levofloxacin, Meperidine, Zaleplon, Broccoli [brassica oleracea], and Adhesive [tape]   Review of Systems Review of Systems  Genitourinary:        Vaginal itching     Physical Exam Triage Vital Signs ED Triage Vitals  Encounter Vitals Group     BP      Girls Systolic BP Percentile      Girls Diastolic BP Percentile      Boys Systolic BP Percentile      Boys Diastolic BP Percentile      Pulse      Resp      Temp      Temp src      SpO2      Weight      Height      Head Circumference      Peak Flow      Pain Score      Pain Loc      Pain Education      Exclude from Growth Chart    No data found.  Updated Vital Signs BP 99/68 (BP Location: Right Arm)   Pulse 72   Temp 97.8 F (36.6 C) (Oral)   Resp 16   SpO2 94%    Physical Exam Vitals and nursing note reviewed.  Constitutional:      Appearance: Normal appearance. She is normal weight.  HENT:     Head: Normocephalic and atraumatic.     Mouth/Throat:  Mouth: Mucous membranes are moist.     Pharynx: Oropharynx is clear.  Eyes:     Extraocular Movements: Extraocular movements intact.     Conjunctiva/sclera: Conjunctivae normal.     Pupils: Pupils are equal, round, and  reactive to light.  Cardiovascular:     Rate and Rhythm: Normal rate and regular rhythm.     Heart sounds: Normal heart sounds.  Pulmonary:     Effort: Pulmonary effort is normal.     Breath sounds: Normal breath sounds. No wheezing, rhonchi or rales.  Musculoskeletal:        General: Normal range of motion.  Skin:    General: Skin is warm and dry.  Neurological:     General: No focal deficit present.     Mental Status: She is alert and oriented to person, place, and time.      UC Treatments / Results  Labs (all labs ordered are listed, but only abnormal results are displayed) Labs Reviewed  CERVICOVAGINAL ANCILLARY ONLY    EKG   Radiology No results found.  Procedures Procedures (including critical care time)  Medications Ordered in UC Medications - No data to display  Initial Impression / Assessment and Plan / UC Course  I have reviewed the triage vital signs and the nursing notes.  Pertinent labs & imaging results that were available during my care of the patient were reviewed by me and considered in my medical decision making (see chart for details).     MDM: 1.  Vaginal itching-Rx'd Diflucan  200 mg tablet: Take 1 tablet p.o. now may repeat 1 tab in 3 days if symptoms or not completely resolved.  Aptima swab ordered. Advised patient take medication as directed.  Encouraged to increase daily water intake while taking this medication.  Advised we will follow-up with Aptima swab results once received.  Advised if symptoms worsen and are unresolved please follow-up with the PCP or here for further evaluation.  Patient discharged home, hemodynamically stable. Final Clinical Impressions(s) / UC Diagnoses   Final diagnoses:  Vaginal itching     Discharge Instructions      Advised patient take medication as directed.  Encouraged to increase daily water intake while taking this medication.  Advised we will follow-up with Aptima swab results once received.  Advised if  symptoms worsen and are unresolved please follow-up with the PCP or here for further evaluation.     ED Prescriptions     Medication Sig Dispense Auth. Provider   fluconazole  (DIFLUCAN ) 200 MG tablet Take 1 tab p.o. now, may repeat 1 tab p.o. in 3 days if symptoms are not resolved. 7 tablet Magaline Steinberg, FNP      PDMP not reviewed this encounter.    [1]  Social History Tobacco Use   Smoking status: Never   Smokeless tobacco: Never  Vaping Use   Vaping status: Never Used  Substance Use Topics   Alcohol use: Yes    Comment: Maybe 3 drinks/year   Drug use: No     Teddy Sharper, FNP 04/17/24 1523  "

## 2024-04-17 NOTE — ED Triage Notes (Signed)
 Patient presents to [facility] for vaginal itching x Thursday. Used monistat and anti-itch cream with no relief.   Denies urinary symptoms.

## 2024-04-17 NOTE — Discharge Instructions (Addendum)
 Advised patient take medication as directed.  Encouraged to increase daily water intake while taking this medication.  Advised we will follow-up with Aptima swab results once received.  Advised if symptoms worsen and are unresolved please follow-up with the PCP or here for further evaluation.

## 2024-04-19 ENCOUNTER — Ambulatory Visit (HOSPITAL_COMMUNITY): Payer: Self-pay

## 2024-04-19 LAB — CERVICOVAGINAL ANCILLARY ONLY
Bacterial Vaginitis (gardnerella): NEGATIVE
Candida Glabrata: NEGATIVE
Candida Vaginitis: POSITIVE — AB
Comment: NEGATIVE
Comment: NEGATIVE
Comment: NEGATIVE

## 2024-05-17 ENCOUNTER — Ambulatory Visit

## 2024-06-13 ENCOUNTER — Ambulatory Visit: Admitting: Physician Assistant

## 2024-09-05 ENCOUNTER — Ambulatory Visit

## 2024-10-18 ENCOUNTER — Ambulatory Visit
